# Patient Record
Sex: Male | Born: 1950 | Race: White | Hispanic: No | Marital: Married | Smoking: Never smoker
Health system: Southern US, Community
[De-identification: ages and names within clinical notes are randomized; demographics above are authoritative.]

## PROBLEM LIST (undated history)

## (undated) DIAGNOSIS — K219 Gastro-esophageal reflux disease without esophagitis: Secondary | ICD-10-CM

## (undated) DIAGNOSIS — G5601 Carpal tunnel syndrome, right upper limb: Secondary | ICD-10-CM

## (undated) DIAGNOSIS — G5731 Lesion of lateral popliteal nerve, right lower limb: Secondary | ICD-10-CM

## (undated) DIAGNOSIS — N200 Calculus of kidney: Secondary | ICD-10-CM

## (undated) DIAGNOSIS — Z87442 Personal history of urinary calculi: Secondary | ICD-10-CM

## (undated) DIAGNOSIS — I350 Nonrheumatic aortic (valve) stenosis: Secondary | ICD-10-CM

## (undated) DIAGNOSIS — Z86010 Personal history of colonic polyps: Secondary | ICD-10-CM

## (undated) DIAGNOSIS — R011 Cardiac murmur, unspecified: Secondary | ICD-10-CM

## (undated) DIAGNOSIS — Z8709 Personal history of other diseases of the respiratory system: Secondary | ICD-10-CM

## (undated) DIAGNOSIS — N529 Male erectile dysfunction, unspecified: Secondary | ICD-10-CM

## (undated) DIAGNOSIS — Z860101 Personal history of adenomatous and serrated colon polyps: Secondary | ICD-10-CM

## (undated) DIAGNOSIS — M1990 Unspecified osteoarthritis, unspecified site: Secondary | ICD-10-CM

## (undated) DIAGNOSIS — F419 Anxiety disorder, unspecified: Secondary | ICD-10-CM

## (undated) DIAGNOSIS — N400 Enlarged prostate without lower urinary tract symptoms: Secondary | ICD-10-CM

## (undated) DIAGNOSIS — F329 Major depressive disorder, single episode, unspecified: Secondary | ICD-10-CM

## (undated) DIAGNOSIS — K429 Umbilical hernia without obstruction or gangrene: Secondary | ICD-10-CM

## (undated) DIAGNOSIS — R7302 Impaired glucose tolerance (oral): Secondary | ICD-10-CM

## (undated) DIAGNOSIS — Z95818 Presence of other cardiac implants and grafts: Secondary | ICD-10-CM

## (undated) DIAGNOSIS — I10 Essential (primary) hypertension: Secondary | ICD-10-CM

## (undated) DIAGNOSIS — I4891 Unspecified atrial fibrillation: Secondary | ICD-10-CM

## (undated) DIAGNOSIS — E669 Obesity, unspecified: Secondary | ICD-10-CM

## (undated) DIAGNOSIS — I77810 Thoracic aortic ectasia: Secondary | ICD-10-CM

## (undated) DIAGNOSIS — J40 Bronchitis, not specified as acute or chronic: Secondary | ICD-10-CM

## (undated) DIAGNOSIS — I480 Paroxysmal atrial fibrillation: Secondary | ICD-10-CM

## (undated) DIAGNOSIS — J45909 Unspecified asthma, uncomplicated: Secondary | ICD-10-CM

## (undated) DIAGNOSIS — G4733 Obstructive sleep apnea (adult) (pediatric): Secondary | ICD-10-CM

## (undated) DIAGNOSIS — E119 Type 2 diabetes mellitus without complications: Secondary | ICD-10-CM

## (undated) DIAGNOSIS — C61 Malignant neoplasm of prostate: Secondary | ICD-10-CM

## (undated) DIAGNOSIS — E118 Type 2 diabetes mellitus with unspecified complications: Secondary | ICD-10-CM

## (undated) DIAGNOSIS — W44F1XA Bezoar entering into or through a natural orifice, initial encounter: Secondary | ICD-10-CM

## (undated) DIAGNOSIS — D131 Benign neoplasm of stomach: Secondary | ICD-10-CM

## (undated) DIAGNOSIS — I2510 Atherosclerotic heart disease of native coronary artery without angina pectoris: Secondary | ICD-10-CM

## (undated) DIAGNOSIS — E785 Hyperlipidemia, unspecified: Secondary | ICD-10-CM

## (undated) DIAGNOSIS — Z8719 Personal history of other diseases of the digestive system: Secondary | ICD-10-CM

## (undated) DIAGNOSIS — I4819 Other persistent atrial fibrillation: Secondary | ICD-10-CM

## (undated) DIAGNOSIS — E1159 Type 2 diabetes mellitus with other circulatory complications: Secondary | ICD-10-CM

## (undated) DIAGNOSIS — K2210 Ulcer of esophagus without bleeding: Secondary | ICD-10-CM

## (undated) DIAGNOSIS — E1169 Type 2 diabetes mellitus with other specified complication: Secondary | ICD-10-CM

## (undated) DIAGNOSIS — R911 Solitary pulmonary nodule: Secondary | ICD-10-CM

## (undated) DIAGNOSIS — R0600 Dyspnea, unspecified: Secondary | ICD-10-CM

## (undated) DIAGNOSIS — K317 Polyp of stomach and duodenum: Secondary | ICD-10-CM

## (undated) DIAGNOSIS — I499 Cardiac arrhythmia, unspecified: Secondary | ICD-10-CM

## (undated) DIAGNOSIS — T189XXA Foreign body of alimentary tract, part unspecified, initial encounter: Secondary | ICD-10-CM

## (undated) DIAGNOSIS — F32A Depression, unspecified: Secondary | ICD-10-CM

## (undated) DIAGNOSIS — E1142 Type 2 diabetes mellitus with diabetic polyneuropathy: Principal | ICD-10-CM

## (undated) DIAGNOSIS — K449 Diaphragmatic hernia without obstruction or gangrene: Secondary | ICD-10-CM

## (undated) DIAGNOSIS — I152 Hypertension secondary to endocrine disorders: Secondary | ICD-10-CM

## (undated) DIAGNOSIS — I82409 Acute embolism and thrombosis of unspecified deep veins of unspecified lower extremity: Secondary | ICD-10-CM

## (undated) HISTORY — DX: Ulcer of esophagus without bleeding: K22.10

## (undated) HISTORY — PX: TONSILLECTOMY: SUR1361

## (undated) HISTORY — DX: Other persistent atrial fibrillation: I48.19

## (undated) HISTORY — DX: Solitary pulmonary nodule: R91.1

## (undated) HISTORY — DX: Benign prostatic hyperplasia without lower urinary tract symptoms: N40.0

## (undated) HISTORY — DX: Type 2 diabetes mellitus with diabetic polyneuropathy: E11.42

## (undated) HISTORY — DX: Bezoar entering into or through a natural orifice, initial encounter: W44.F1XA

## (undated) HISTORY — DX: Thoracic aortic ectasia: I77.810

## (undated) HISTORY — PX: EYE SURGERY: SHX253

## (undated) HISTORY — DX: Type 2 diabetes mellitus with other circulatory complications: E11.59

## (undated) HISTORY — DX: Type 2 diabetes mellitus with unspecified complications: E11.8

## (undated) HISTORY — DX: Lesion of lateral popliteal nerve, right lower limb: G57.31

## (undated) HISTORY — DX: Essential (primary) hypertension: I10

## (undated) HISTORY — PX: ESOPHAGOGASTRODUODENOSCOPY: SHX1529

## (undated) HISTORY — DX: Impaired glucose tolerance (oral): R73.02

## (undated) HISTORY — PX: FRACTURE SURGERY: SHX138

## (undated) HISTORY — PX: JOINT REPLACEMENT: SHX530

## (undated) HISTORY — DX: Nonrheumatic aortic (valve) stenosis: I35.0

## (undated) HISTORY — DX: Diaphragmatic hernia without obstruction or gangrene: K44.9

## (undated) HISTORY — DX: Carpal tunnel syndrome, right upper limb: G56.01

## (undated) HISTORY — DX: Atherosclerotic heart disease of native coronary artery without angina pectoris: I25.10

## (undated) HISTORY — DX: Hyperlipidemia, unspecified: E78.5

## (undated) HISTORY — PX: CARDIAC CATHETERIZATION: SHX172

## (undated) HISTORY — DX: Benign neoplasm of stomach: D13.1

## (undated) HISTORY — PX: CYSTOSCOPY W/ STONE MANIPULATION: SHX1427

## (undated) HISTORY — DX: Gastro-esophageal reflux disease without esophagitis: K21.9

## (undated) HISTORY — PX: PERCUTANEOUS NEPHROSTOLITHOTOMY: SHX2207

## (undated) HISTORY — DX: Type 2 diabetes mellitus with other specified complication: E11.69

## (undated) HISTORY — PX: KIDNEY STONE SURGERY: SHX686

## (undated) HISTORY — PX: FOREARM FRACTURE SURGERY: SHX649

## (undated) HISTORY — DX: Personal history of adenomatous and serrated colon polyps: Z86.0101

## (undated) HISTORY — DX: Unspecified osteoarthritis, unspecified site: M19.90

## (undated) HISTORY — DX: Foreign body of alimentary tract, part unspecified, initial encounter: T18.9XXA

## (undated) HISTORY — DX: Personal history of colonic polyps: Z86.010

## (undated) HISTORY — PX: COLONOSCOPY: SHX174

## (undated) HISTORY — DX: Polyp of stomach and duodenum: K31.7

## (undated) HISTORY — DX: Obesity, unspecified: E66.9

## (undated) HISTORY — DX: Unspecified asthma, uncomplicated: J45.909

## (undated) HISTORY — PX: TOTAL KNEE ARTHROPLASTY: SHX125

## (undated) HISTORY — DX: Hypertension secondary to endocrine disorders: I15.2

## (undated) HISTORY — PX: REPLACEMENT TOTAL KNEE BILATERAL: SUR1225

## (undated) HISTORY — PX: KNEE ARTHROSCOPY: SHX127

## (undated) HISTORY — DX: Unspecified atrial fibrillation: I48.91

---

## 1999-11-15 DIAGNOSIS — Z86718 Personal history of other venous thrombosis and embolism: Secondary | ICD-10-CM

## 1999-11-15 HISTORY — PX: TOTAL KNEE ARTHROPLASTY: SHX125

## 1999-11-15 HISTORY — DX: Personal history of other venous thrombosis and embolism: Z86.718

## 1999-11-16 ENCOUNTER — Encounter: Payer: Self-pay | Admitting: Orthopedic Surgery

## 1999-11-22 ENCOUNTER — Inpatient Hospital Stay (HOSPITAL_COMMUNITY): Admission: RE | Admit: 1999-11-22 | Discharge: 1999-11-27 | Payer: Self-pay | Admitting: Orthopedic Surgery

## 1999-11-22 ENCOUNTER — Encounter: Payer: Self-pay | Admitting: Orthopedic Surgery

## 2000-06-05 ENCOUNTER — Encounter: Payer: Self-pay | Admitting: Urology

## 2000-06-05 ENCOUNTER — Ambulatory Visit (HOSPITAL_COMMUNITY): Admission: RE | Admit: 2000-06-05 | Discharge: 2000-06-05 | Payer: Self-pay | Admitting: Urology

## 2005-07-28 ENCOUNTER — Ambulatory Visit (HOSPITAL_BASED_OUTPATIENT_CLINIC_OR_DEPARTMENT_OTHER): Admission: RE | Admit: 2005-07-28 | Discharge: 2005-07-28 | Payer: Self-pay | Admitting: Orthopedic Surgery

## 2005-07-28 ENCOUNTER — Ambulatory Visit (HOSPITAL_COMMUNITY): Admission: RE | Admit: 2005-07-28 | Discharge: 2005-07-28 | Payer: Self-pay | Admitting: Orthopedic Surgery

## 2006-01-30 ENCOUNTER — Encounter: Admission: RE | Admit: 2006-01-30 | Discharge: 2006-01-30 | Payer: Self-pay | Admitting: Family Medicine

## 2006-05-31 ENCOUNTER — Ambulatory Visit: Payer: Self-pay | Admitting: Family Medicine

## 2006-06-03 ENCOUNTER — Encounter: Admission: RE | Admit: 2006-06-03 | Discharge: 2006-06-03 | Payer: Self-pay | Admitting: Family Medicine

## 2007-05-29 ENCOUNTER — Ambulatory Visit: Payer: Self-pay | Admitting: Family Medicine

## 2007-11-28 ENCOUNTER — Ambulatory Visit: Payer: Self-pay | Admitting: Family Medicine

## 2007-12-06 ENCOUNTER — Ambulatory Visit: Payer: Self-pay | Admitting: Family Medicine

## 2007-12-11 ENCOUNTER — Ambulatory Visit: Payer: Self-pay | Admitting: Family Medicine

## 2007-12-31 ENCOUNTER — Encounter: Admission: RE | Admit: 2007-12-31 | Discharge: 2007-12-31 | Payer: Self-pay | Admitting: Family Medicine

## 2008-01-07 ENCOUNTER — Ambulatory Visit: Payer: Self-pay | Admitting: Family Medicine

## 2008-01-22 ENCOUNTER — Ambulatory Visit: Payer: Self-pay | Admitting: Family Medicine

## 2008-02-11 ENCOUNTER — Ambulatory Visit: Payer: Self-pay | Admitting: Family Medicine

## 2008-03-05 ENCOUNTER — Ambulatory Visit: Payer: Self-pay | Admitting: Family Medicine

## 2008-06-04 ENCOUNTER — Ambulatory Visit: Payer: Self-pay | Admitting: Family Medicine

## 2008-11-14 HISTORY — PX: COLONOSCOPY: SHX174

## 2009-01-04 ENCOUNTER — Encounter: Admission: RE | Admit: 2009-01-04 | Discharge: 2009-01-04 | Payer: Self-pay | Admitting: Orthopedic Surgery

## 2009-01-12 ENCOUNTER — Encounter: Admission: RE | Admit: 2009-01-12 | Discharge: 2009-01-12 | Payer: Self-pay | Admitting: Orthopedic Surgery

## 2009-01-27 ENCOUNTER — Encounter: Admission: RE | Admit: 2009-01-27 | Discharge: 2009-01-27 | Payer: Self-pay | Admitting: Orthopedic Surgery

## 2009-02-11 ENCOUNTER — Encounter: Admission: RE | Admit: 2009-02-11 | Discharge: 2009-02-11 | Payer: Self-pay | Admitting: Orthopedic Surgery

## 2009-05-15 ENCOUNTER — Ambulatory Visit: Payer: Self-pay | Admitting: Family Medicine

## 2009-05-26 ENCOUNTER — Ambulatory Visit: Payer: Self-pay | Admitting: Family Medicine

## 2009-06-08 LAB — HM COLONOSCOPY: HM Colonoscopy: 2

## 2010-04-08 ENCOUNTER — Emergency Department (HOSPITAL_COMMUNITY): Admission: EM | Admit: 2010-04-08 | Discharge: 2010-04-08 | Payer: Self-pay | Admitting: Emergency Medicine

## 2010-04-11 ENCOUNTER — Emergency Department (HOSPITAL_COMMUNITY): Admission: EM | Admit: 2010-04-11 | Discharge: 2010-04-11 | Payer: Self-pay | Admitting: Emergency Medicine

## 2010-04-15 ENCOUNTER — Emergency Department (HOSPITAL_COMMUNITY): Admission: EM | Admit: 2010-04-15 | Discharge: 2010-04-15 | Payer: Self-pay | Admitting: Family Medicine

## 2010-04-22 ENCOUNTER — Emergency Department (HOSPITAL_COMMUNITY): Admission: EM | Admit: 2010-04-22 | Discharge: 2010-04-22 | Payer: Self-pay | Admitting: Emergency Medicine

## 2010-06-25 ENCOUNTER — Ambulatory Visit: Payer: Self-pay | Admitting: Family Medicine

## 2010-09-15 ENCOUNTER — Ambulatory Visit: Payer: Self-pay | Admitting: Family Medicine

## 2011-02-25 ENCOUNTER — Other Ambulatory Visit: Payer: Self-pay | Admitting: Orthopedic Surgery

## 2011-02-25 DIAGNOSIS — M542 Cervicalgia: Secondary | ICD-10-CM

## 2011-03-01 ENCOUNTER — Ambulatory Visit
Admission: RE | Admit: 2011-03-01 | Discharge: 2011-03-01 | Disposition: A | Discharge status: Home / Self Care | Payer: BC Managed Care – PPO | Source: Ambulatory Visit | Attending: Orthopedic Surgery | Admitting: Orthopedic Surgery

## 2011-03-01 DIAGNOSIS — M542 Cervicalgia: Secondary | ICD-10-CM

## 2011-03-19 ENCOUNTER — Encounter: Payer: Self-pay | Admitting: Family Medicine

## 2011-03-21 ENCOUNTER — Ambulatory Visit (INDEPENDENT_AMBULATORY_CARE_PROVIDER_SITE_OTHER): Payer: BC Managed Care – PPO | Admitting: Family Medicine

## 2011-03-21 DIAGNOSIS — J45902 Unspecified asthma with status asthmaticus: Secondary | ICD-10-CM

## 2011-03-21 DIAGNOSIS — Z79899 Other long term (current) drug therapy: Secondary | ICD-10-CM

## 2011-03-21 DIAGNOSIS — G478 Other sleep disorders: Secondary | ICD-10-CM

## 2011-03-24 ENCOUNTER — Telehealth: Payer: Self-pay | Admitting: Family Medicine

## 2011-03-24 NOTE — Telephone Encounter (Signed)
Called pt.

## 2011-03-28 ENCOUNTER — Telehealth: Payer: Self-pay | Admitting: Family Medicine

## 2011-03-28 MED ORDER — AZITHROMYCIN 500 MG PO TABS: 500.0000 mg | ORAL_TABLET | Freq: Every day | ORAL | Status: AC

## 2011-03-28 NOTE — Telephone Encounter (Signed)
He states he is approximately 70% better. I will give him another dose pack of azithromycin

## 2011-04-01 NOTE — Op Note (Signed)
[place]  Patient:    Jeremy Tucker                         MRN: [id number] Proc. Date: 11/22/99 Adm. Date:  [id number] Attending:  Colbert Ewing                           Operative Report  PROCEDURE:  Lumbar epidural catheter placement for postoperative pain control.  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 under general anesthesia.  Prior to the procedure, Burna Forts, M.D. explained the risks and benefits of epidural  pain control with the patient and her understood and agreed to proceed with this form of pain control following surgery.  DESCRIPTION OF PROCEDURE:  Upon completion of the procedure while the patient was still under general endotracheal anesthesia, he was turned to the left lateral decubitus position and the low back was prepped and draped sterilely.  The L3-4  interspace was entered in the midline using an 18 gauge Tuohy needle.  Two passes of the needle were required to enter the epidural space using a loss of resistance technique with air.  Aspiration of the needle was negative for blood or CSF. 10 cc of 1/8% Marcaine to which was added 100 mcg of fentanyl was injected through the needle without difficulty.  The catheter was then threaded 4 cm into the epidural space and the needle was removed without difficulty.  The catheter was then taped securely in place.  The patient was subsequently returned to the supine position, extubated, and brought to the recovery room in stable condition.  He will then begun on an epidural fentanyl and Marcaine infusion and followed on a daily basis by the anesthesia service.  He tolerated the procedure well without apparent complications. DD:  11/22/99 TD:  11/22/99 Job: [id number]

## 2011-04-01 NOTE — Discharge Summary (Signed)
[place]  Patient:    Jeremy Tucker                         MRN: [id number] Adm. Date:  [id number] Disc. Date: [id number] Attending:  Colbert Ewing Dictator:   Oris Drone. Petrarca, P.A.-C.                           Discharge Summary  ADMISSION DIAGNOSIS:  Advanced degenerative joint disease of the left knee.  DISCHARGE DIAGNOSIS:  Advanced degenerative joint disease of the left knee.  PROCEDURES:  Left total knee replacement.  HISTORY OF PRESENT ILLNESS:  [name] with persistent left knee pain.  Had an arthroscopy of the left knee is August 1998, stating and showing grade 3 changes medial femoral condyle with chondrocalcinosis.  He has had increasing symptoms with decreasing medial joint space on x-ray.  He is admitted at this time for unicompartmental knee versus total knee replacement.  HOSPITAL COURSE:  [name] admitted November 22, 1999, after appropriate laboratory studies were obtained, as well as 1 g of Ancef IV on call to the operating room.  He was taken to the operating room where he underwent a left total knee replacement.  At the time, he was noted to have rather significant degenerative changes, and it was felt that unicompartmental knee replacement was not indicated.  He tolerated the procedure well.  Continued on Ancef postoperatively.  Epidural was placed for pain management.  Routine medications  were continued.  PT, OT, and social service consults were obtained.  A Foley was placed intraoperatively.  Total knee protocol and precautions were used.  He was weightbearing as tolerated with physical therapy.  He did have difficulty with ome soreness in the throat post intubation, and Cepastat throat lozenges were used.  Postoperatively, he developed elevation of his temperature and, at that time, blood cultures x 2 were ordered and a CBC in the morning.  Kefzol 1 g q.6h.  was then started.  This was on November 23, 1999.  Epidural was discontinued on November 24, 1999.  His Foley also was discontinued on November 24, 1999.  He did develop some hypokalemia, and this was treated with the use of 10 mEq of KCl 2 p.o. b.i.d. and also the use of bananas.  Kefzol was discontinued on November 24, 1999.  Other than his hypokalemia, he had an unremarkable hospital course.  He did well in physical therapy, and was discharged on November 27, 1999, to return back to the office in 10 days for staple removal.  LABORATORY DATA:  EKG was noted to be normal sinus rhythm.  Radiographic studies showed a total knee replacement in good position and alignment on November 22, 1999.  Preoperative hemoglobin 14.3, hematocrit 39.2%, white count 9600, platelets 251,000.  Discharge hemoglobin 11.8, hematocrit 31.8%, white count 11,000, platelets 224,000.  Preoperative chemistries:  Sodium 139, potassium 3.8, chloride 98, CO2 34, glucose 95, BUN 14, creatinine 0.8, calcium 9.9, total protein 7.0,  albumin 4.1, AST was 57, ALT 79, ALP 62, total bilirubin 0.9.  Discharge sodium  137, potassium 3.3, chloride 95, CO2 32, glucose 107, BUN 16, creatinine 0.8, calcium 8.8.  Urinalysis was benign for a voided urine.  Blood cultures x 2 showed no growth.  He was blood type A positive, antibody screen negative.  DISCHARGE MEDICATIONS: 1. He was given a prescription  for Coumadin 7.5 mg as directed by [facility]. 2. Ambien 10 mg 1 q.h.s. p.r.n. sleep. 3. Percocet 1-2 q.4h. p.r.n. pain. 4. Protonix 1 tablet daily.  DISCHARGE INSTRUCTIONS:  He will begin rehabilitation at [facility].  For laboratories, he will need draws on January 16, January 23, January 30, and December 20, 1999.  Use his walker for ambulation.  Keep the dressing clean and dry.  May shower.  Call for increase in temperature.  FOLLOW-UP:  Otherwise, follow back up in the office in 10 days for  staple removal.  CONDITION ON DISCHARGE:  Improved. DD:  01/24/00 TD:  01/24/00 Job: [id number]

## 2011-04-01 NOTE — Op Note (Signed)
NAMEJARIUS, DIEUDONNE NO.:  [id number]   MEDICAL RECORD NO.:  [id number]          PATIENT TYPE:  AMB   LOCATION:  DSC                          FACILITY:  [facility]   PHYSICIAN:  Loreta Ave, M.D. DATE OF BIRTH:  Jun 25, 1951   DATE OF PROCEDURE:  07/28/2005  DATE OF DISCHARGE:                                 OPERATIVE REPORT   PREOPERATIVE DIAGNOSES:  1.  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  2.  Torn medial meniscus.   POSTOPERATIVE DIAGNOSES:  1.  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  2.  Torn medial meniscus.  3.  Lateral meniscus tear.  4.  Grade 2 chondromalacia of patella.  5.  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   PROCEDURES:  1.  Right knee exam under anesthesia.  2.  Arthroscopy with partial medial and lateral meniscectomy.  3.  Partial synovectomy.  4.  Chondroplasty of patella and medial femoral condyle.   SURGEON:  Loreta Ave, M.D.   ASSISTANT:  Genene Churn. Denton Meek.   ANESTHESIA:  General.   BLOOD LOSS:  Minimal.   TOURNIQUET:  Not employed.   SPECIMENS:  None.   CULTURES:  None.   COMPLICATIONS:  None.   DRESSING:  Soft compressive.   PROCEDURE:  The patient was brought to the operating room and after adequate  anesthesia had been obtained, the knee examined.  Good motion and stability.  Good patellofemoral tracking but with crepitus.  Positive McMurray's.  Tourniquet and leg holder applied, the leg prepped and draped in the usual  sterile fashion.  Three portals created, one superolateral, one each medial  and lateral parapatellar.  Inflow catheter introduced, the knee distended,  the arthroscope introduced, and the knee inspected. Grade 2 chondromalacia  of the lateral facet debrided.  A little bit of roughening.  Good tracking.  Marked reactive synovitis throughout the knee debrided.  Medial meniscus  marked complex tearing, entire posterior half, removed down  to a stable rim,  tapered into the remaining meniscus salvaging the anterior half.  Grade 2  and 3 changes throughout the weightbearing dome.  All of this debrided to a  stable surface.  No full-thickness loss.  Plateau looked good.  Cruciate  ligaments intact.  Lateral compartment:  No degenerative changes, but radial  degenerative tearing of the middle third. Saucerized out and tapered it  smoothly.  Remaining meniscus intact.  All recesses examined to be sure all  loose fragments were removed.  Instruments and fluid removed.  Portals and  the knee injected with Marcaine.  Portals closed with 4-0 nylon  Sterile  compressive dressing applied.  Anesthesia reversed.  Brought to the recovery  room.  Tolerated his surgery well with no complications.      Loreta Ave, M.D.  Electronically Signed     DFM/MEDQ  D:  07/28/2005  T:  07/28/2005  Job:  [id number]

## 2011-04-01 NOTE — Op Note (Signed)
[place]  Patient:    Jeremy Tucker                         MRN: [id number] Proc. Date: 11/22/99 Adm. Date:  [id number] Attending:  Colbert Ewing CC:         Oris Drone. Petrarca, P.A.-C.                           Operative Report  PREOPERATIVE DIAGNOSIS:  Degenerative joint disease, end stage left knee.  POSTOPERATIVE DIAGNOSIS:  Degenerative joint disease, end stage left knee with tricompartmental changes.  PROCEDURE:  Left knee exam under anesthesia followed by left total knee replacement.  Osteonics prosthesis.  Press-Fit posterior stabilizing #9 femoral  component.  Cemented #9 tibial component with 10 mm polyethylene insert. Cemented recessed nonmetal backed 26 mm patellar component.  SURGEON:  Loreta Ave, M.D.  ASSISTANT:  Arlys John D. Petrarca, P.A.-C.  ANESTHESIA:  General.  BLOOD LOSS:  Minimal.  TOURNIQUET TIME:  1 hour.  SPECIMENS:  Excised bone and soft tissue.  CULTURES: None.  COMPLICATIONS:  None.  DRAINS:  Hemovac x 2.  PROCEDURE:  The patient was brought to the operating room, placed on the operative table in supine position.  After adequate anesthesia had been obtained the left  knee examined.  Just about full extension at full flexion.  Good stability but ith varus alignment and with tibial femoral medial as well as patellofemoral crepitus. Tourniquet applied.  Prepped and draped in the usual sterile fashion. Exsanguinated with elevation and Esmarch.  Tourniquet inflated to 350 mmHg. STraight incision above the patella down to the tibial tubercle.  Medial parapatellar arthrotomy.  Knee exposed.  Grade 4 changes medial compartment with Grade 2-3 changes patellofemoral joint and lateral compartment therefore necessitating need for complete rather than unicompartmental replacement.  Further the posterior cruciate ligament is deficient with a positive posterior drawer, positive ski slope and  posterolateral and posteromedial instability.  The remnants of menisci and ACL removed as well as hypertrophic synovitis.  A very prominent lateral plica incised to ensure good tracking patellofemoral joint at  completion.  Distal femur exposed.  Intermedullary guide placed.  Distal cut removing 10 mm set about ______ valgus.  Sized from a 9 component.  Jigs put in  place.  Definitive cuts made for the posterior stabilizing prosthesis.  Trial put in place and found to fit well.  Trial removed.  Tibia exposed.  Tibial spines removed with a saw.  The intermedullary guide placed.  Proximal cut removing 6 m off the deficient medial side with a 5 degree posterior slope cut.  Trials put n place, marked for rotation with excellent stability and alignment with the 10 mm insert including full flexion without component lift-off.  Trials removed. Hand reaming proximal tibial after appropriately marked rotation.  Patella was then exposed, sized, reamed and drilled with a 26 mm patella.  With all trials in place I had full extension, full flexion at 5 degrees of valgus alignment.  Good stability and good patellofemoral tracking.  All trials removed.  All recess examined.  All spurs and loose bodies removed.  Copious irrigation with a pulse irrigating device.  Cement prepared and placed n the tibial component which is hammered in place.  Polyethylene attached. Femoral component then hammered in place with the knee reduced.   Patellar component held in place with a clamp after  cement removed.  Once the cement had hardened the knee was examined.  Full extension, full flexion, good stability, good alignment. Two Hemovacs were placed and brought out through separate stab wounds.   Arthrotomy  closed with #1 Vicryl, skin and subcutaneous tissue with Vicryl and staples. Marked the wound and knee injected with Marcaine.  Sterile compressive dressing  applied.  A knee immobilizer applied after  tourniquet removed.  Anesthesia was hen to proceed with placement of an epidural catheter for postoperative analgesia. Following this anesthesia reversed and brought to recovery room.  Tolerated surgery well with no complications. DD:  11/22/99 TD:  11/22/99 Job: [id number]

## 2011-04-04 ENCOUNTER — Telehealth: Payer: Self-pay | Admitting: Family Medicine

## 2011-04-21 NOTE — Telephone Encounter (Signed)
DONE

## 2011-06-08 ENCOUNTER — Other Ambulatory Visit: Payer: Self-pay | Admitting: Family Medicine

## 2011-06-08 NOTE — Telephone Encounter (Signed)
Is this ok?

## 2011-06-09 ENCOUNTER — Other Ambulatory Visit: Payer: Self-pay

## 2011-06-09 NOTE — Telephone Encounter (Signed)
This has already been done.

## 2011-10-15 ENCOUNTER — Other Ambulatory Visit: Payer: Self-pay | Admitting: Family Medicine

## 2011-10-17 ENCOUNTER — Other Ambulatory Visit: Payer: Self-pay | Admitting: Family Medicine

## 2011-10-17 MED ORDER — LISINOPRIL-HYDROCHLOROTHIAZIDE 10-12.5 MG PO TABS: 1.0000 | ORAL_TABLET | Freq: Every day | ORAL | Status: DC

## 2011-10-17 NOTE — Telephone Encounter (Signed)
Called pt to let him know that med was called in and to make appt after holiday

## 2011-10-17 NOTE — Telephone Encounter (Signed)
Have him set up an appointment to see me after the holidays

## 2011-10-17 NOTE — Telephone Encounter (Signed)
Sent med in for pt  

## 2011-10-17 NOTE — Telephone Encounter (Signed)
Is this ok?

## 2011-11-17 ENCOUNTER — Telehealth: Payer: Self-pay | Admitting: Internal Medicine

## 2011-11-17 MED ORDER — LISINOPRIL-HYDROCHLOROTHIAZIDE 10-12.5 MG PO TABS: 1.0000 | ORAL_TABLET | Freq: Every day | ORAL | Status: DC

## 2011-11-17 NOTE — Telephone Encounter (Signed)
Refilled for 90 day. Will call pt and schedule an appt to come in

## 2011-12-01 ENCOUNTER — Telehealth: Payer: Self-pay | Admitting: Internal Medicine

## 2011-12-01 MED ORDER — ZOLPIDEM TARTRATE 10 MG PO TABS: 10.0000 mg | ORAL_TABLET | Freq: Every evening | ORAL | Status: DC | PRN

## 2011-12-01 NOTE — Telephone Encounter (Signed)
Renew zolpidem.

## 2011-12-01 NOTE — Telephone Encounter (Signed)
Called med in per jcl 

## 2011-12-01 NOTE — Telephone Encounter (Signed)
Called ambien in per jcl 

## 2011-12-05 ENCOUNTER — Encounter: Payer: Self-pay | Admitting: Family Medicine

## 2011-12-05 ENCOUNTER — Ambulatory Visit (INDEPENDENT_AMBULATORY_CARE_PROVIDER_SITE_OTHER): Payer: BC Managed Care – PPO | Admitting: Family Medicine

## 2011-12-05 ENCOUNTER — Telehealth: Payer: Self-pay | Admitting: Internal Medicine

## 2011-12-05 VITALS — BP 126/80 | HR 59 | Ht 72.0 in | Wt 246.0 lb

## 2011-12-05 DIAGNOSIS — E669 Obesity, unspecified: Secondary | ICD-10-CM

## 2011-12-05 DIAGNOSIS — E1159 Type 2 diabetes mellitus with other circulatory complications: Secondary | ICD-10-CM

## 2011-12-05 DIAGNOSIS — E1169 Type 2 diabetes mellitus with other specified complication: Secondary | ICD-10-CM

## 2011-12-05 DIAGNOSIS — E118 Type 2 diabetes mellitus with unspecified complications: Secondary | ICD-10-CM | POA: Insufficient documentation

## 2011-12-05 DIAGNOSIS — N529 Male erectile dysfunction, unspecified: Secondary | ICD-10-CM

## 2011-12-05 DIAGNOSIS — E785 Hyperlipidemia, unspecified: Secondary | ICD-10-CM

## 2011-12-05 DIAGNOSIS — I152 Hypertension secondary to endocrine disorders: Secondary | ICD-10-CM

## 2011-12-05 DIAGNOSIS — M129 Arthropathy, unspecified: Secondary | ICD-10-CM

## 2011-12-05 DIAGNOSIS — J45909 Unspecified asthma, uncomplicated: Secondary | ICD-10-CM | POA: Insufficient documentation

## 2011-12-05 DIAGNOSIS — I10 Essential (primary) hypertension: Secondary | ICD-10-CM

## 2011-12-05 DIAGNOSIS — M1990 Unspecified osteoarthritis, unspecified site: Secondary | ICD-10-CM

## 2011-12-05 DIAGNOSIS — E119 Type 2 diabetes mellitus without complications: Secondary | ICD-10-CM

## 2011-12-05 LAB — LIPID PANEL
Cholesterol: 119 mg/dL (ref 0–200)
HDL: 43 mg/dL (ref >39)
LDL Cholesterol: 53 mg/dL (ref 0–99)
Total CHOL/HDL Ratio: 2.8 Ratio
Triglycerides: 113 mg/dL (ref <150)
VLDL: 23 mg/dL (ref 0–40)

## 2011-12-05 LAB — POCT GLYCOSYLATED HEMOGLOBIN (HGB A1C): Hemoglobin A1C: 6.3

## 2011-12-05 MED ORDER — MOMETASONE FUROATE 220 MCG/INH IN AEPB: 2.0000 | INHALATION_SPRAY | Freq: Two times a day (BID) | RESPIRATORY_TRACT | Status: DC

## 2011-12-05 MED ORDER — LISINOPRIL-HYDROCHLOROTHIAZIDE 10-12.5 MG PO TABS: 1.0000 | ORAL_TABLET | Freq: Every day | ORAL | Status: DC

## 2011-12-05 MED ORDER — EZETIMIBE-SIMVASTATIN 10-80 MG PO TABS: 1.0000 | ORAL_TABLET | Freq: Every day | ORAL | Status: DC

## 2011-12-05 MED ORDER — BUPROPION HCL ER (XL) 150 MG PO TB24: 150.0000 mg | ORAL_TABLET | ORAL | Status: DC

## 2011-12-05 MED ORDER — ALBUTEROL SULFATE HFA 108 (90 BASE) MCG/ACT IN AERS: 2.0000 | INHALATION_SPRAY | Freq: Four times a day (QID) | RESPIRATORY_TRACT | Status: DC | PRN

## 2011-12-05 NOTE — Progress Notes (Signed)
  Subjective:    Patient ID: Jeremy Tucker, male    DOB: 22-Sep-1951, 61 y.o.   MRN: [id number]  HPI He is here for medication check. He continues on medications listed in the chart. He admits to not exercising regularly. He has not checked his blood sugars recently. He continues on his allergy and asthma medications. He has had some difficulty recently with erectile dysfunction and was given Cialis by his urologist. He has a history of renal stones and is on allopurinol to help with this. This seems to be working. He continues on Celebrex which seems to be the only medicine that helps his arthritis. His marriage is going well. He is getting ready to go on a cruise.   Review of Systems     Objective:   Physical Exam Alert and in no distress. Hbl A1c is 6.3       Assessment & Plan:   1. Hyperlipidemia LDL goal <70  Lipid panel  2. Diabetes mellitus  POCT HgB A1C  3. Obesity (BMI 30-39.9)    4. Asthma    5. Arthritis    6. ED (erectile dysfunction)    7. Hypertension associated with diabetes    8 renal stones Encouraged him to make further changes in his diet and exercise regimen. Lipid panel will be drawn.

## 2011-12-06 ENCOUNTER — Other Ambulatory Visit: Payer: Self-pay

## 2011-12-09 ENCOUNTER — Telehealth: Payer: Self-pay

## 2011-12-12 NOTE — Telephone Encounter (Signed)
done

## 2011-12-12 NOTE — Telephone Encounter (Signed)
Opened by accident

## 2011-12-20 ENCOUNTER — Encounter: Payer: Self-pay | Admitting: Medical

## 2011-12-20 ENCOUNTER — Ambulatory Visit (INDEPENDENT_AMBULATORY_CARE_PROVIDER_SITE_OTHER): Payer: BC Managed Care – PPO | Admitting: Medical

## 2011-12-20 VITALS — BP 130/80 | HR 62 | Temp 98.1°F | Resp 16

## 2011-12-20 DIAGNOSIS — J40 Bronchitis, not specified as acute or chronic: Secondary | ICD-10-CM

## 2011-12-20 MED ORDER — AZITHROMYCIN 500 MG PO TABS: 500.0000 mg | ORAL_TABLET | Freq: Every day | ORAL | Status: AC

## 2011-12-20 NOTE — Progress Notes (Signed)
Subjective:  Jeremy Tucker is a 61 y.o. male who presents for 1 day hx/o lightheaded, dizzy, body aches, cough, wheezing last night.  Denies fever.  He sees Dr. Annalee Genta and uses nasal spray at night.  He notes mild sinus pressure, but no ear pain or sore throat. +some nausea this morning.  He notes hx/o bronchitis, usually 1-2 times yearly.   He has never smoked.  He has been on Asmanex for a few years.   Denies sick contacts.  No other aggravating or relieving factors.  No other c/o.  The following portions of the patient's history were reviewed and updated as appropriate: allergies, current medications, past family history, past medical history, past social history, past surgical history and problem list.  Past Medical History  Diagnosis Date  . Hyperlipidemia   . Allergy   . GERD (gastroesophageal reflux disease)   . Diabetes mellitus   . BPH (benign prostatic hyperplasia)   . Gout   . Obesity      Objective:   Filed Vitals:   12/20/11 0926  BP: 130/80  Pulse: 62  Temp: 98.1 F (36.7 C)  Resp: 16    General appearance: Alert, WD/WN, no distress                             Skin: warm, no rash, no diaphoresis                           Head: no sinus tenderness                            Eyes: conjunctiva normal, corneas clear, PERRLA                            Ears: left TM retracted right TM normal, external ear canals normal                          Nose: septum midline, right turbinate swollen, with erythema              Mouth/throat: MMM, tongue normal, mild pharyngeal erythema                           Neck: supple, no adenopathy, no thyromegaly, nontender                          Heart: RRR, normal S1, S2, no murmurs                         Lungs: +bronchial breath sounds, +scattered rhonchi, left upper faint wheeze, no rales                Extremities: no edema, nontender     Assessment and Plan:   Encounter Diagnosis  Name Primary?  . Bronchitis Yes   Advised  that symptom currently may just suggest early viral bronchitis.   Advised rest, hydration, OTC Mucinex DM, c/t daily Asmanex, increase albuterol inhaler to TID for next several days.  If worsening, can begin Azithromycin.  If worse, wheezing, SOB, then call/return.  I reviewed prior notes in chart showing similar prior eval and treatment for bronchitis and asthmatic bronchitis.

## 2011-12-20 NOTE — Patient Instructions (Signed)
For the next few days consider Mucinex DM or Coricidin HBP for cough and congestion.  Continue Asmanex daily.  Use the inhaler/albuterol 3 times daily the next few days.    Rest, drink plenty of water.  I wrote a prescription for Azithromycin.  If your symptoms worsen such as fever, thick mucous production with cough, then begin Azithromycin.  Call or return if worse or not improving.    Acute Bronchitis Bronchitis is a problem of the air tubes leading to your lungs. Acute means the illness started quickly. In this condition, the lining of those tubes becomes puffy (swollen) and can leak fluid. This makes it harder for air to get in and out of your lungs. You may cough a lot. This is because the air tubes are narrow. Bronchitis is most often caused by a virus. Medicines that kill germs (antibiotics) may be needed with germ (bacteria) infections for people who:  Smoke.   Have lasting (chronic) lung problems.   Are elderly.  HOME CARE  Rest.   Drink enough water and fluids to keep the pee clear or pale yellow.   Only take medicine as told by your doctor.   Medicines may be prescribed that will open up the airways. This will help make breathing easier.   Bronchitis usually gets better on its own in a few days.  Recovery from some problems (symptoms) of bronchitis may be slow. You should start feeling a little better after 2 to 3 days. Coughing may last for 3 to 4 weeks. GET HELP RIGHT AWAY IF:  You or your child has a temperature by mouth above 101, not controlled by medicine.   Chills or chest pain develops.   You or your child develops very bad shortness of breath.   There is bloody saliva mixed with mucus (sputum).   You or your child throws up (vomits) often, loses too much fluid (dehydration), feels faint, or has a very bad headache.   You or your child does not improve after 1 week of treatment.  MAKE SURE YOU:   Understand these instructions.   Will watch this condition.     Will get help right away if you or your child is not doing well or gets worse.  Document Released: 04/18/2008 Document Re-Released: 01/25/2010 [facility] Patient Information 2011 [place], [place].

## 2011-12-23 ENCOUNTER — Telehealth: Payer: Self-pay | Admitting: Family Medicine

## 2011-12-23 NOTE — Telephone Encounter (Signed)
Pt called and would like for you go call him and tell him what more to do.  Fever, sweats, staying in bed, coughing up yellow chunks.  Do you want to call him in something more to [facility] on [address] and he really wants to talk with you.

## 2011-12-23 NOTE — Telephone Encounter (Signed)
Dr. Susann Givens called pt about his concerns.

## 2012-01-05 ENCOUNTER — Other Ambulatory Visit: Payer: Self-pay | Admitting: Family Medicine

## 2012-01-05 MED ORDER — AZITHROMYCIN 500 MG PO TABS: 500.0000 mg | ORAL_TABLET | Freq: Every day | ORAL | Status: AC

## 2012-01-05 MED ORDER — HYDROCOD POLST-CHLORPHEN POLST 10-8 MG/5ML PO LQCR: 5.0000 mL | Freq: Two times a day (BID) | ORAL | Status: DC | PRN

## 2012-03-07 ENCOUNTER — Telehealth: Payer: Self-pay | Admitting: Family Medicine

## 2012-03-07 NOTE — Telephone Encounter (Signed)
Called pt and advised need 1 month ov to follow up blood sugar.  He will call back after checking his calendar.

## 2012-04-13 ENCOUNTER — Encounter: Payer: Self-pay | Admitting: Internal Medicine

## 2012-04-23 ENCOUNTER — Encounter: Payer: Self-pay | Admitting: Family Medicine

## 2012-04-23 ENCOUNTER — Ambulatory Visit (INDEPENDENT_AMBULATORY_CARE_PROVIDER_SITE_OTHER): Payer: BC Managed Care – PPO | Admitting: Family Medicine

## 2012-04-23 ENCOUNTER — Telehealth: Payer: Self-pay | Admitting: Family Medicine

## 2012-04-23 ENCOUNTER — Other Ambulatory Visit: Payer: Self-pay | Admitting: Family Medicine

## 2012-04-23 VITALS — BP 130/80 | HR 80 | Ht 72.0 in | Wt 248.0 lb

## 2012-04-23 DIAGNOSIS — J45909 Unspecified asthma, uncomplicated: Secondary | ICD-10-CM

## 2012-04-23 DIAGNOSIS — E669 Obesity, unspecified: Secondary | ICD-10-CM

## 2012-04-23 DIAGNOSIS — N529 Male erectile dysfunction, unspecified: Secondary | ICD-10-CM

## 2012-04-23 DIAGNOSIS — I152 Hypertension secondary to endocrine disorders: Secondary | ICD-10-CM

## 2012-04-23 DIAGNOSIS — I10 Essential (primary) hypertension: Secondary | ICD-10-CM

## 2012-04-23 DIAGNOSIS — E785 Hyperlipidemia, unspecified: Secondary | ICD-10-CM

## 2012-04-23 DIAGNOSIS — Z0000 Encounter for general adult medical examination without abnormal findings: Secondary | ICD-10-CM

## 2012-04-23 DIAGNOSIS — E119 Type 2 diabetes mellitus without complications: Secondary | ICD-10-CM

## 2012-04-23 DIAGNOSIS — E1159 Type 2 diabetes mellitus with other circulatory complications: Secondary | ICD-10-CM

## 2012-04-23 DIAGNOSIS — Z2911 Encounter for prophylactic immunotherapy for respiratory syncytial virus (RSV): Secondary | ICD-10-CM

## 2012-04-23 DIAGNOSIS — E1169 Type 2 diabetes mellitus with other specified complication: Secondary | ICD-10-CM

## 2012-04-23 LAB — CBC WITH DIFFERENTIAL/PLATELET
Basophils Absolute: 0 10*3/uL (ref 0.0–0.1)
Basophils Relative: 1 % (ref 0–1)
Eosinophils Absolute: 0.1 10*3/uL (ref 0.0–0.7)
Eosinophils Relative: 2 % (ref 0–5)
HCT: 43.8 % (ref 39.0–52.0)
Hemoglobin: 15.1 g/dL (ref 13.0–17.0)
Lymphocytes Relative: 18 % (ref 12–46)
Lymphs Abs: 1.3 10*3/uL (ref 0.7–4.0)
MCH: 30.3 pg (ref 26.0–34.0)
MCHC: 34.5 g/dL (ref 30.0–36.0)
MCV: 88 fL (ref 78.0–100.0)
Monocytes Absolute: 0.7 10*3/uL (ref 0.1–1.0)
Monocytes Relative: 9 % (ref 3–12)
Neutro Abs: 5.2 10*3/uL (ref 1.7–7.7)
Neutrophils Relative %: 70 % (ref 43–77)
Platelets: 247 10*3/uL (ref 150–400)
RBC: 4.98 MIL/uL (ref 4.22–5.81)
RDW: 13.4 % (ref 11.5–15.5)
WBC: 7.3 10*3/uL (ref 4.0–10.5)

## 2012-04-23 LAB — COMPREHENSIVE METABOLIC PANEL
ALT: 51 U/L (ref 0–53)
AST: 37 U/L (ref 0–37)
Albumin: 4.8 g/dL (ref 3.5–5.2)
Alkaline Phosphatase: 61 U/L (ref 39–117)
BUN: 11 mg/dL (ref 6–23)
CO2: 26 mEq/L (ref 19–32)
Calcium: 9.7 mg/dL (ref 8.4–10.5)
Chloride: 101 mEq/L (ref 96–112)
Creat: 0.72 mg/dL (ref 0.50–1.35)
Glucose, Bld: 131 mg/dL — ABNORMAL HIGH (ref 70–99)
Potassium: 4.1 mEq/L (ref 3.5–5.3)
Sodium: 138 mEq/L (ref 135–145)
Total Bilirubin: 0.5 mg/dL (ref 0.3–1.2)
Total Protein: 7.1 g/dL (ref 6.0–8.3)

## 2012-04-23 LAB — LIPID PANEL
Cholesterol: 137 mg/dL (ref 0–200)
HDL: 50 mg/dL (ref >39)
LDL Cholesterol: 65 mg/dL (ref 0–99)
Total CHOL/HDL Ratio: 2.7 Ratio
Triglycerides: 111 mg/dL (ref <150)
VLDL: 22 mg/dL (ref 0–40)

## 2012-04-23 LAB — PSA: PSA: 2.27 ng/mL (ref <=4.00)

## 2012-04-23 MED ORDER — ZOLPIDEM TARTRATE 10 MG PO TABS: 10.0000 mg | ORAL_TABLET | Freq: Every evening | ORAL | Status: DC | PRN

## 2012-04-23 NOTE — Progress Notes (Signed)
Called ambien in per jcl 

## 2012-04-23 NOTE — Progress Notes (Signed)
Subjective:    Patient ID: Jeremy Tucker, male    DOB: [birth year], 61 y.o.   MRN: [id number]  HPI  he is here for complete examination. He does have underlying allergies and asthma and keeps these well controlled. Presently he is on no medication for his diabetes . He admits to dietary indiscretion and has not been as compliant with his exercises he should. He has had some recent difficulty with erectile dysfunction and blames this on the use of Rapaflo. He saw his urologist and was given Cialis on a daily basis for this. He is scheduled for colonoscopy. He does check his blood sugars intermittently. He has an eye exam as well as cardiology exam set up in the near future. His work is quite stressful due to financial concerns. Work stress is causing intermittent difficulty with sleep however he uses very low doses of Ambien only usually twice per week. He and his wife are getting along quite well.   Review of Systems Negative except as above    Objective:   Physical Exam BP 130/80  Pulse 80  Ht 6' (1.829 m)  Wt 248 lb (112.492 kg)  BMI 33.63 kg/m2  General Appearance:    Alert, cooperative, no distress, appears stated age  Head:    Normocephalic, without obvious abnormality, atraumatic  Eyes:    PERRL, conjunctiva/corneas clear, EOM's intact, fundi    benign  Ears:    Normal TM's and external ear canals  Nose:   Nares normal, mucosa normal, no drainage or sinus   tenderness  Throat:   Lips, mucosa, and tongue normal; teeth and gums normal  Neck:   Supple, no lymphadenopathy;  thyroid:  no   enlargement/tenderness/nodules; no carotid   bruit or JVD  Back:    Spine nontender, no curvature, ROM normal, no CVA     tenderness  Lungs:     Clear to auscultation bilaterally without wheezes, rales or     ronchi; respirations unlabored  Chest Wall:    No tenderness or deformity   Heart:    Regular rate and rhythm, S1 and S2 normal, no murmur, rub   or gallop  Breast Exam:    No chest wall  tenderness, masses or gynecomastia  Abdomen:     Soft, non-tender, nondistended, normoactive bowel sounds,    no masses, no hepatosplenomegaly  Genitalia:   deferred   Rectal:   deferred   Extremities:   No clubbing, cyanosis or edema  Pulses:   2+ and symmetric all extremities  Skin:   Skin color, texture, turgor normal, no rashes or lesions  Lymph nodes:   Cervical, supraclavicular, and axillary nodes normal  Neurologic:   CNII-XII intact, normal strength, sensation and gait; reflexes 2+ and symmetric throughout          Psych:   Normal mood, affect, hygiene and grooming.           Assessment & Plan:   1. Routine general medical examination at a health care facility  Varicella-zoster vaccine subcutaneous, PSA, CBC with Differential, Comprehensive metabolic panel, Lipid panel  2. Hyperlipidemia LDL goal <70  Lipid panel  3. Diabetes mellitus  POCT UA - Microalbumin  4. Obesity (BMI 30-39.9)    5. Asthma    6. ED (erectile dysfunction)  PSA  7. Hypertension associated with diabetes  CBC with Differential, Comprehensive metabolic panel   he'll continue on his present medications. I discussed the use of Cialis on a daily basis. He is  doing well on the 5 mg but did recommend he try 2.5 mg in potentially uses on an as-needed basis. I will give him a refill on his Ambien. Encouraged him to make diet and exercise changes however do this on a permanent basis rather than on an intermittent basis.

## 2012-04-23 NOTE — Patient Instructions (Signed)
Keep working on [employer] and exercise. Start to cut back on carbohydrates.

## 2012-04-24 ENCOUNTER — Other Ambulatory Visit: Payer: Self-pay

## 2012-04-24 LAB — HEMOGLOBIN A1C
Hgb A1c MFr Bld: 6.4 % — ABNORMAL HIGH (ref <5.7)
Mean Plasma Glucose: 137 mg/dL — ABNORMAL HIGH (ref <117)

## 2012-04-24 MED ORDER — ZOLPIDEM TARTRATE 10 MG PO TABS: 10.0000 mg | ORAL_TABLET | Freq: Every evening | ORAL | Status: DC | PRN

## 2012-04-24 NOTE — Telephone Encounter (Signed)
Given 30. I thought it was actually 30

## 2012-04-24 NOTE — Telephone Encounter (Signed)
Pt called and wanted to know why his Remus Loffler was only # 15 and not #30

## 2012-04-24 NOTE — Telephone Encounter (Signed)
CALLED IN AMBIEN AGAIN IT NEEDED TO BE # 30 NOT # 15

## 2012-04-26 NOTE — Telephone Encounter (Signed)
DONE

## 2012-05-10 ENCOUNTER — Encounter: Payer: Self-pay | Admitting: Family Medicine

## 2012-05-14 LAB — HM COLONOSCOPY: HM Colonoscopy: NORMAL

## 2012-09-27 ENCOUNTER — Telehealth: Payer: Self-pay | Admitting: Family Medicine

## 2012-09-27 MED ORDER — ZOLPIDEM TARTRATE 10 MG PO TABS: 10.0000 mg | ORAL_TABLET | Freq: Every evening | ORAL | Status: DC | PRN

## 2012-09-27 NOTE — Telephone Encounter (Signed)
Okay to renew

## 2012-09-27 NOTE — Telephone Encounter (Signed)
Called ambien into pharmacy.  

## 2012-12-26 ENCOUNTER — Other Ambulatory Visit: Payer: Self-pay | Admitting: Family Medicine

## 2013-01-09 ENCOUNTER — Other Ambulatory Visit: Payer: Self-pay | Admitting: Family Medicine

## 2013-01-09 NOTE — Telephone Encounter (Signed)
Is this ok?

## 2013-01-09 NOTE — Telephone Encounter (Signed)
Renew the Ambien 

## 2013-01-09 NOTE — Telephone Encounter (Signed)
CALLED AMBIEN IN PER JCL 

## 2013-01-10 ENCOUNTER — Telehealth: Payer: Self-pay | Admitting: Internal Medicine

## 2013-01-10 NOTE — Telephone Encounter (Signed)
error 

## 2013-01-11 ENCOUNTER — Telehealth: Payer: Self-pay | Admitting: Family Medicine

## 2013-01-14 NOTE — Telephone Encounter (Signed)
Called and left message to call back.

## 2013-01-14 NOTE — Telephone Encounter (Signed)
Vytorin is costing him a lot of money. I recommended that he call back when he is about to run out to possibly be switched to Crestor and be given a saviors card

## 2013-01-28 ENCOUNTER — Telehealth: Payer: Self-pay | Admitting: Family Medicine

## 2013-01-28 NOTE — Telephone Encounter (Signed)
Pt called and stated he wanted you to call him. Pt stated that he is feeling a little light-headed but states his bp is fine 120/75. Just wanted to discuss issues pt stated he didn't think a appt was necessary plus he didn't know if it should be with you Sharlene Motts. Please call pt.

## 2013-01-28 NOTE — Telephone Encounter (Signed)
He called stating that he was having some difficulty with shortness of breath with walking up a flight of stairs. He also complains of slight dizziness. I recommended that he call his cardiologist for evaluation of his shortness of his DOE. And then we will deal with the dizziness at a later date.

## 2013-01-29 ENCOUNTER — Ambulatory Visit (INDEPENDENT_AMBULATORY_CARE_PROVIDER_SITE_OTHER): Payer: BC Managed Care – PPO | Admitting: Family Medicine

## 2013-01-29 ENCOUNTER — Encounter: Payer: Self-pay | Admitting: Family Medicine

## 2013-01-29 VITALS — BP 124/80 | HR 74 | Wt 252.0 lb

## 2013-01-29 DIAGNOSIS — I951 Orthostatic hypotension: Secondary | ICD-10-CM

## 2013-01-29 DIAGNOSIS — H8110 Benign paroxysmal vertigo, unspecified ear: Secondary | ICD-10-CM

## 2013-01-29 MED ORDER — MECLIZINE HCL 12.5 MG PO TABS: 12.5000 mg | ORAL_TABLET | Freq: Three times a day (TID) | ORAL | Status: DC | PRN

## 2013-01-29 NOTE — Patient Instructions (Signed)
See if there is a correlation between her symptoms and taking the Cialis

## 2013-01-29 NOTE — Progress Notes (Signed)
  Subjective:    Patient ID: Jeremy Tucker, male    DOB: 1951/11/09, 62 y.o.   MRN: [id number]  HPI He is here for evaluation of right-sided headache discomfort as well as dizziness. The 2 are not related. They're intermittent in nature and have been going on for last 4 weeks. He does note that when he lies down into its his head back, he will have more difficulty with dizziness but this does not have an effect on his pain. He also notes dizziness when he goes from sitting to standing very quickly however this dizziness goes away very fast. The right-sided pain is intermittent in nature but no associated blurred vision, double vision, nausea or vomiting. He is under stress from work related issues as well as his only son getting married and 32 days. He also is had some dyspnea on exertion symptoms and is scheduled for a stress test tomorrow morning.   Review of Systems     Objective:   Physical Exam alert and in no distress. EOMI. DTRs normal.Tympanic membranes and canals are normal. Throat is clear. Tonsils are normal. Neck is supple without adenopathy or thyromegaly. Cardiac exam shows a regular sinus rhythm without murmurs or gallops. Lungs are clear to auscultation.        Assessment & Plan:  Postural hypotension  Benign paroxysmal positional vertigo - Plan: meclizine (ANTIVERT) 12.5 MG tablet I explained that some of his symptoms especially getting up very quickly are posture related. Also lying down sounds more like positional vertigo. Recommended an anti-inflammatory for his headache. Explained that none these symptoms make me worry about anything devastating. He will keep in touch with me if any of the symptoms change.

## 2013-03-07 ENCOUNTER — Other Ambulatory Visit: Payer: Self-pay | Admitting: Family Medicine

## 2013-03-08 NOTE — Telephone Encounter (Signed)
Is this okay to fill? 

## 2013-03-08 NOTE — Telephone Encounter (Signed)
Ok to renew?  

## 2013-03-08 NOTE — Telephone Encounter (Signed)
Done

## 2013-03-08 NOTE — Telephone Encounter (Signed)
Okay to renew

## 2013-04-01 ENCOUNTER — Other Ambulatory Visit: Payer: Self-pay | Admitting: Family Medicine

## 2013-04-02 NOTE — Telephone Encounter (Signed)
Is this ok?

## 2013-04-02 NOTE — Telephone Encounter (Signed)
Okay to renew

## 2013-04-02 NOTE — Telephone Encounter (Signed)
CALLED THIS IN 

## 2013-05-17 ENCOUNTER — Other Ambulatory Visit: Payer: Self-pay | Admitting: Family Medicine

## 2013-05-20 NOTE — Telephone Encounter (Signed)
Is this ok?

## 2013-05-20 NOTE — Telephone Encounter (Signed)
Okay to renew

## 2013-05-30 ENCOUNTER — Encounter: Payer: Self-pay | Admitting: Family Medicine

## 2013-07-03 ENCOUNTER — Other Ambulatory Visit: Payer: Self-pay | Admitting: Family Medicine

## 2013-07-03 ENCOUNTER — Telehealth: Payer: Self-pay | Admitting: Family Medicine

## 2013-07-03 NOTE — Telephone Encounter (Signed)
PT CALLED AND STATED THAT HE NEEDED REFILLS ON MEDICATIONS. HE STATES THAT YOU HAD A CONVERSATION WITH HIM ABOUT CHANGING VYTORIN BECAUSE IT WAS SO EXPENSIVE. PT WAS REMINDED HE IS DUE FOR CPE. PT STATES HE WILL CALL ME BACK IN THE NEXT FEW DAYS TO SET ONE UP FOR October. I DID NOTICE THAT THESE CAME THRU EMAIL TOO. REQUEST WAS TO  REFILL BP MEDS AND VYTORIN. AGAIN HE WANTS VYTORIN CHANGED. I AM SENDING MESSAGE TO YOU AND NURSE SO THEY CAN SEE NOTE SO THEY WILL NOT FILL VYTORIN UNTIL THEY HEAR FROM YOU. PLEASE REFILL BP MEDS AS WELL. PT USES [facility] AT [facility] AND [address].

## 2013-07-03 NOTE — Telephone Encounter (Signed)
He needs to let us know what statin drug will be better covered under his insurance plan. Call it in and renew his other medication

## 2013-07-04 NOTE — Telephone Encounter (Signed)
PT INFORMED AND SAID HE WOULD CALL ME BACK

## 2013-07-08 NOTE — Telephone Encounter (Signed)
STILL WAITING ON HIM TO CALL BACK

## 2013-07-09 ENCOUNTER — Telehealth: Payer: Self-pay | Admitting: Family Medicine

## 2013-07-09 MED ORDER — EZETIMIBE-SIMVASTATIN 10-40 MG PO TABS: 1.0000 | ORAL_TABLET | Freq: Every day | ORAL | Status: DC

## 2013-07-09 NOTE — Telephone Encounter (Signed)
LM

## 2013-07-14 NOTE — Telephone Encounter (Signed)
Change to atorvastatin  20mg  and recheck his lipids in 2 mo.

## 2013-07-16 ENCOUNTER — Telehealth: Payer: Self-pay | Admitting: Family Medicine

## 2013-07-16 ENCOUNTER — Other Ambulatory Visit: Payer: Self-pay

## 2013-07-16 MED ORDER — ATORVASTATIN CALCIUM 20 MG PO TABS: 20.0000 mg | ORAL_TABLET | Freq: Every day | ORAL | Status: DC

## 2013-07-16 NOTE — Telephone Encounter (Signed)
DID YOU SEE THIS

## 2013-07-16 NOTE — Telephone Encounter (Signed)
SENT IN NEW CHOLESTEROL MED

## 2013-07-18 NOTE — Telephone Encounter (Signed)
JCL talked with pt & switched medication

## 2013-07-23 NOTE — Telephone Encounter (Signed)
tsd  °

## 2013-08-03 ENCOUNTER — Other Ambulatory Visit: Payer: Self-pay | Admitting: Family Medicine

## 2013-09-19 ENCOUNTER — Other Ambulatory Visit: Payer: Self-pay

## 2013-09-20 ENCOUNTER — Other Ambulatory Visit: Payer: Self-pay | Admitting: Family Medicine

## 2013-09-20 NOTE — Telephone Encounter (Signed)
Is this okay to fill these meds

## 2013-09-21 NOTE — Telephone Encounter (Signed)
OK to renew Ambien

## 2013-11-03 ENCOUNTER — Other Ambulatory Visit: Payer: Self-pay | Admitting: Family Medicine

## 2013-11-04 ENCOUNTER — Telehealth: Payer: Self-pay | Admitting: Family Medicine

## 2013-11-04 NOTE — Telephone Encounter (Signed)
I don't know how he got that message but I'll see him next March

## 2013-11-04 NOTE — Telephone Encounter (Signed)
Is this okay?

## 2013-11-04 NOTE — Telephone Encounter (Signed)
Pt called and stated that he was informed he needed an appt. I dont see anything is his chart. Does he need to be seen and if so for what?

## 2013-11-04 NOTE — Telephone Encounter (Signed)
PT SAID OK

## 2013-12-04 ENCOUNTER — Telehealth: Payer: Self-pay | Admitting: Internal Medicine

## 2013-12-04 NOTE — Telephone Encounter (Signed)
Pt would like to try a ventolin inhaler for 1 month to replace his asmanex. Send to [facility]

## 2013-12-06 ENCOUNTER — Telehealth: Payer: Self-pay | Admitting: Internal Medicine

## 2013-12-06 NOTE — Telephone Encounter (Signed)
     Pt would like to try a ventolin inhaler for 1 month to replace his asmanex. Send to [facility]

## 2013-12-27 ENCOUNTER — Other Ambulatory Visit: Payer: Self-pay | Admitting: Family Medicine

## 2013-12-30 NOTE — Telephone Encounter (Signed)
Is this ok to refill?  

## 2014-01-26 ENCOUNTER — Other Ambulatory Visit: Payer: Self-pay | Admitting: Family Medicine

## 2014-01-28 ENCOUNTER — Other Ambulatory Visit: Payer: Self-pay | Admitting: Family Medicine

## 2014-02-27 ENCOUNTER — Other Ambulatory Visit: Payer: Self-pay | Admitting: Family Medicine

## 2014-02-27 MED ORDER — ATORVASTATIN CALCIUM 20 MG PO TABS: 20.0000 mg | ORAL_TABLET | Freq: Every day | ORAL | Status: DC

## 2014-02-27 NOTE — Telephone Encounter (Signed)
Is this ok to refill?  

## 2014-02-27 NOTE — Telephone Encounter (Signed)
Okay to renew

## 2014-02-27 NOTE — Telephone Encounter (Signed)
Medication called in 

## 2014-05-05 ENCOUNTER — Other Ambulatory Visit: Payer: Self-pay | Admitting: Family Medicine

## 2014-05-22 ENCOUNTER — Other Ambulatory Visit: Payer: Self-pay | Admitting: Family Medicine

## 2014-05-22 NOTE — Telephone Encounter (Signed)
IS THIS OKAY 

## 2014-05-22 NOTE — Telephone Encounter (Signed)
Okay to renew

## 2014-08-04 ENCOUNTER — Other Ambulatory Visit: Payer: Self-pay | Admitting: Family Medicine

## 2014-08-04 NOTE — Telephone Encounter (Signed)
DR.LALONDE IS THIS OKAY 

## 2014-08-04 NOTE — Telephone Encounter (Signed)
Don't let them run out and let him know it is time for a recheck. Only give him 30

## 2014-08-05 ENCOUNTER — Telehealth: Payer: Self-pay | Admitting: Internal Medicine

## 2014-08-05 ENCOUNTER — Other Ambulatory Visit: Payer: Self-pay | Admitting: Family Medicine

## 2014-08-05 MED ORDER — BUPROPION HCL ER (XL) 150 MG PO TB24: 150.0000 mg | ORAL_TABLET | Freq: Every day | ORAL | Status: DC

## 2014-08-05 NOTE — Telephone Encounter (Signed)
I have called pt and let him know that he hasn't been seen in a while for a CPE and Dr. Redmond School is only giving him a 30 day supply of his bupropion but he gets a 90 day supply at a time and per JCL it is okay to do a 90 day. Pt ask for me to call him on 9/29 to schedule a cpe for him.

## 2014-09-02 ENCOUNTER — Other Ambulatory Visit: Payer: Self-pay | Admitting: Family Medicine

## 2014-09-23 ENCOUNTER — Other Ambulatory Visit: Payer: Self-pay | Admitting: Family Medicine

## 2014-09-23 ENCOUNTER — Encounter: Payer: Self-pay | Admitting: Family Medicine

## 2014-09-23 ENCOUNTER — Telehealth: Payer: Self-pay | Admitting: Internal Medicine

## 2014-09-23 ENCOUNTER — Telehealth: Payer: Self-pay | Admitting: Family Medicine

## 2014-09-23 ENCOUNTER — Ambulatory Visit (INDEPENDENT_AMBULATORY_CARE_PROVIDER_SITE_OTHER): Payer: BC Managed Care – PPO | Admitting: Family Medicine

## 2014-09-23 VITALS — BP 124/76 | HR 70 | Ht 72.0 in | Wt 234.0 lb

## 2014-09-23 DIAGNOSIS — Z0000 Encounter for general adult medical examination without abnormal findings: Secondary | ICD-10-CM

## 2014-09-23 DIAGNOSIS — E669 Obesity, unspecified: Secondary | ICD-10-CM

## 2014-09-23 DIAGNOSIS — J4520 Mild intermittent asthma, uncomplicated: Secondary | ICD-10-CM

## 2014-09-23 DIAGNOSIS — E785 Hyperlipidemia, unspecified: Secondary | ICD-10-CM

## 2014-09-23 DIAGNOSIS — H269 Unspecified cataract: Secondary | ICD-10-CM

## 2014-09-23 DIAGNOSIS — Z87442 Personal history of urinary calculi: Secondary | ICD-10-CM

## 2014-09-23 DIAGNOSIS — R011 Cardiac murmur, unspecified: Secondary | ICD-10-CM

## 2014-09-23 DIAGNOSIS — R030 Elevated blood-pressure reading, without diagnosis of hypertension: Secondary | ICD-10-CM

## 2014-09-23 DIAGNOSIS — N529 Male erectile dysfunction, unspecified: Secondary | ICD-10-CM

## 2014-09-23 DIAGNOSIS — R7302 Impaired glucose tolerance (oral): Secondary | ICD-10-CM

## 2014-09-23 DIAGNOSIS — IMO0001 Reserved for inherently not codable concepts without codable children: Secondary | ICD-10-CM

## 2014-09-23 LAB — CBC WITH DIFFERENTIAL/PLATELET
Basophils Absolute: 0 10*3/uL (ref 0.0–0.1)
Basophils Relative: 0 % (ref 0–1)
Eosinophils Absolute: 0.3 10*3/uL (ref 0.0–0.7)
Eosinophils Relative: 3 % (ref 0–5)
HCT: 43 % (ref 39.0–52.0)
Hemoglobin: 14.8 g/dL (ref 13.0–17.0)
Lymphocytes Relative: 15 % (ref 12–46)
Lymphs Abs: 1.4 10*3/uL (ref 0.7–4.0)
MCH: 31.4 pg (ref 26.0–34.0)
MCHC: 34.4 g/dL (ref 30.0–36.0)
MCV: 91.3 fL (ref 78.0–100.0)
Monocytes Absolute: 0.7 10*3/uL (ref 0.1–1.0)
Monocytes Relative: 8 % (ref 3–12)
Neutro Abs: 6.9 10*3/uL (ref 1.7–7.7)
Neutrophils Relative %: 74 % (ref 43–77)
Platelets: 257 10*3/uL (ref 150–400)
RBC: 4.71 MIL/uL (ref 4.22–5.81)
RDW: 13.3 % (ref 11.5–15.5)
WBC: 9.3 10*3/uL (ref 4.0–10.5)

## 2014-09-23 LAB — COMPREHENSIVE METABOLIC PANEL
ALT: 33 U/L (ref 0–53)
AST: 29 U/L (ref 0–37)
Albumin: 4.4 g/dL (ref 3.5–5.2)
Alkaline Phosphatase: 76 U/L (ref 39–117)
BUN: 14 mg/dL (ref 6–23)
CO2: 25 mEq/L (ref 19–32)
Calcium: 9 mg/dL (ref 8.4–10.5)
Chloride: 101 mEq/L (ref 96–112)
Creat: 0.71 mg/dL (ref 0.50–1.35)
Glucose, Bld: 122 mg/dL — ABNORMAL HIGH (ref 70–99)
Potassium: 4.1 mEq/L (ref 3.5–5.3)
Sodium: 138 mEq/L (ref 135–145)
Total Bilirubin: 0.4 mg/dL (ref 0.2–1.2)
Total Protein: 6.5 g/dL (ref 6.0–8.3)

## 2014-09-23 LAB — LIPID PANEL
Cholesterol: 153 mg/dL (ref 0–200)
HDL: 43 mg/dL (ref >39)
LDL Cholesterol: 63 mg/dL (ref 0–99)
Total CHOL/HDL Ratio: 3.6 Ratio
Triglycerides: 234 mg/dL — ABNORMAL HIGH (ref <150)
VLDL: 47 mg/dL — ABNORMAL HIGH (ref 0–40)

## 2014-09-23 LAB — POCT GLYCOSYLATED HEMOGLOBIN (HGB A1C): Hemoglobin A1C: 6

## 2014-09-23 MED ORDER — BUPROPION HCL ER (XL) 300 MG PO TB24: 300.0000 mg | ORAL_TABLET | Freq: Every day | ORAL | Status: DC

## 2014-09-23 MED ORDER — ZOLPIDEM TARTRATE 10 MG PO TABS: ORAL_TABLET | ORAL | Status: DC

## 2014-09-23 MED ORDER — ATORVASTATIN CALCIUM 20 MG PO TABS: 20.0000 mg | ORAL_TABLET | Freq: Every day | ORAL | Status: DC

## 2014-09-23 NOTE — Progress Notes (Signed)
Subjective:    Patient ID: Jeremy Tucker, male    DOB: August 08, 1951, 63 y.o.   MRN: [id number]  HPI He is here for complete examination. He remains under work-related stress. 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 He does use a small amount of Ambien on a daily basis stating one third of the pill. Presently he is on Wellbutrin 150 mg per day. He has stopped taking his asthma medications stating he has had no difficulty with freezing for at least the last 3 or 4 months. He has also stopped his blood pressure medication. He continues on Rapaflo for treatment of BPH. He also has erectile dysfunction and does use ED medicine given by his urologist. He has a previous history of glucose intolerance with a hemoglobin A1c of 6.4. He has lost some weight since then. He does plan on getting a colonoscopy next year.he has a history of cataracts and is being followed by ophthalmology. He has no other concerns or complaints. His marriage is going well. Social and family history was otherwise reviewed.   Review of Systems  All other systems reviewed and are negative.      Objective:   Physical Exam BP 124/76 mmHg  Pulse 70  Ht 6' (1.829 m)  Wt 234 lb (106.142 kg)  BMI 31.73 kg/m2  General Appearance:    Alert, cooperative, no distress, appears stated age  Head:    Normocephalic, without obvious abnormality, atraumatic  Eyes:    PERRL, conjunctiva/corneas clear, EOM's intact, fundi    Difficult to see due to cataracts  Ears:    Normal TM's and external ear canals  Nose:   Nares normal, mucosa normal, no drainage or sinus   tenderness  Throat:   Lips, mucosa, and tongue normal; teeth and gums normal  Neck:   Supple, no lymphadenopathy;  thyroid:  no   enlargement/tenderness/nodules; no carotid   bruit or JVD  Back:    Spine nontender, no curvature, ROM normal, no CVA     tenderness  Lungs:     Clear to  auscultation bilaterally without wheezes, rales or     ronchi; respirations unlabored  Chest Wall:    No tenderness or deformity   Heart:    Irregular rhythm is heard. A harsh 8-0/9 systolic murmur heard best along the left sternal border was noted. No gallops.  Breast Exam:    No chest wall tenderness, masses or gynecomastia  Abdomen:     Soft, non-tender, nondistended, normoactive bowel sounds,    no masses, no hepatosplenomegaly        Extremities:   No clubbing, cyanosis or edema  Pulses:   2+ and symmetric all extremities  Skin:   Skin color, texture, turgor normal, no rashes or lesions  Lymph nodes:   Cervical, supraclavicular, and axillary nodes normal  Neurologic:   CNII-XII intact, normal strength, sensation and gait; reflexes 2+ and symmetric throughout          Psych:   Normal mood, affect, hygiene and grooming.    EKG shows PACs HbA1c is 6.0     Assessment & Plan:  Routine general medical examination at a health care facility - Plan: CBC with Differential, Comprehensive metabolic panel, Lipid panel, POCT glycosylated hemoglobin (Hb A1C)  Murmur, cardiac - Plan: EKG 12-Lead, 2D Echocardiogram without contrast  Asthma, mild intermittent, uncomplicated  Glucose intolerance (impaired glucose tolerance)  Hyperlipidemia  LDL goal <70 - Plan: Lipid panel, atorvastatin (LIPITOR) 20 MG tablet  Obesity (BMI 30-39.9)  Cataract  History of renal stone  Elevated blood pressure  Erectile dysfunction, unspecified erectile dysfunction type since he is doing well off his asthma medicine as well as hypertension meds, I will not renew them at this time.I will have him increase his Wellbutrin to 300 mg. He will let me know whether the 150 versus a 300 mg is a difference in cost. He will continue to be followed by urology. Follow-up on the murmur pending results of echocardiogram.

## 2014-09-23 NOTE — Telephone Encounter (Signed)
Called in med 

## 2014-09-23 NOTE — Telephone Encounter (Signed)
Pt is aware of his EchoCardiogram appt on 09/25/14 @ 8:30am at [facility] on [address]. Pt was approved from today to 10/22/14 confirmation # [id number]

## 2014-09-23 NOTE — Telephone Encounter (Signed)
Wellbutrin 300 called in as well as a 90 supply of Ambien.

## 2014-09-23 NOTE — Telephone Encounter (Signed)
Is this okay to call in? 

## 2014-09-25 ENCOUNTER — Ambulatory Visit (HOSPITAL_COMMUNITY): Payer: BC Managed Care – PPO | Attending: Cardiology

## 2014-09-25 DIAGNOSIS — R011 Cardiac murmur, unspecified: Secondary | ICD-10-CM | POA: Diagnosis not present

## 2014-09-25 DIAGNOSIS — E785 Hyperlipidemia, unspecified: Secondary | ICD-10-CM | POA: Diagnosis not present

## 2014-09-25 DIAGNOSIS — E669 Obesity, unspecified: Secondary | ICD-10-CM | POA: Insufficient documentation

## 2014-09-25 DIAGNOSIS — I350 Nonrheumatic aortic (valve) stenosis: Secondary | ICD-10-CM

## 2014-09-25 NOTE — Progress Notes (Signed)
   Subjective:    Patient ID: Jeremy Tucker, male    DOB: 1951/02/19, 63 y.o.   MRN: [id number]  HPI    Review of Systems     Objective:   Physical Exam        Assessment & Plan:  The echo does show aortic stenosis with slight regurg. This was also reviewed by Dr. Daneen Schick. He recommended repeating this in 3 years. The information was given to the patient.

## 2014-09-25 NOTE — Progress Notes (Signed)
2D Echo completed. 09/25/2014

## 2014-10-23 ENCOUNTER — Telehealth: Payer: Self-pay | Admitting: Internal Medicine

## 2014-10-23 NOTE — Telephone Encounter (Signed)
Pt would like you to call him, has he has a question about heart murmur.

## 2014-10-24 ENCOUNTER — Telehealth: Payer: Self-pay | Admitting: Family Medicine

## 2014-10-24 NOTE — Telephone Encounter (Signed)
Pt called and stated he needed to speak to you. He states he talked with you yesterday and this is a follow up from that conversation. Pt can be reached at [phone].

## 2014-10-31 ENCOUNTER — Other Ambulatory Visit: Payer: Self-pay | Admitting: Family Medicine

## 2014-11-04 NOTE — Telephone Encounter (Signed)
11/04/2014 °

## 2014-11-14 HISTORY — PX: CATARACT EXTRACTION W/ INTRAOCULAR LENS  IMPLANT, BILATERAL: SHX1307

## 2014-11-14 HISTORY — PX: EYE SURGERY: SHX253

## 2014-12-03 ENCOUNTER — Telehealth: Payer: Self-pay | Admitting: Internal Medicine

## 2014-12-03 ENCOUNTER — Other Ambulatory Visit: Payer: Self-pay

## 2014-12-03 MED ORDER — ZOLPIDEM TARTRATE 10 MG PO TABS: ORAL_TABLET | ORAL | Status: DC

## 2014-12-03 NOTE — Telephone Encounter (Signed)
Refill request for zolpidem 10mg  # 90 to [facility]

## 2014-12-03 NOTE — Telephone Encounter (Signed)
Ok

## 2014-12-03 NOTE — Telephone Encounter (Signed)
done

## 2015-01-01 ENCOUNTER — Telehealth: Payer: Self-pay | Admitting: Family Medicine

## 2015-01-01 NOTE — Telephone Encounter (Signed)
Pt called and requested a call back from you. He has concerns over the recent news about Prilosec. Please call pt at [phone].

## 2015-01-01 NOTE — Telephone Encounter (Signed)
He called concerning news about Prilosec. I recommended that he use this as minimally as possible. He will back off to every other day and then potentially every third day. Also discussed the possible use of H2 blockers.

## 2015-01-19 ENCOUNTER — Telehealth: Payer: Self-pay | Admitting: Family Medicine

## 2015-01-19 NOTE — Telephone Encounter (Signed)
Pt said he just got back from [place] visiting his grand daughter and is now sick.  He would not accept an appt.  He asked that you call him.  [phone]   Sx = head cold, coughing up yellow

## 2015-01-21 ENCOUNTER — Telehealth: Payer: Self-pay | Admitting: Family Medicine

## 2015-01-21 ENCOUNTER — Encounter (HOSPITAL_COMMUNITY): Payer: Self-pay | Admitting: *Deleted

## 2015-01-21 ENCOUNTER — Other Ambulatory Visit (HOSPITAL_COMMUNITY): Payer: Self-pay

## 2015-01-21 ENCOUNTER — Encounter: Payer: Self-pay | Admitting: Family Medicine

## 2015-01-21 ENCOUNTER — Ambulatory Visit (INDEPENDENT_AMBULATORY_CARE_PROVIDER_SITE_OTHER): Payer: BLUE CROSS/BLUE SHIELD | Admitting: Family Medicine

## 2015-01-21 ENCOUNTER — Observation Stay (HOSPITAL_COMMUNITY)
Admission: EM | Admit: 2015-01-21 | Discharge: 2015-01-22 | Disposition: A | Discharge status: Home / Self Care | Payer: BLUE CROSS/BLUE SHIELD | Attending: Cardiovascular Disease | Admitting: Cardiovascular Disease

## 2015-01-21 ENCOUNTER — Emergency Department (HOSPITAL_COMMUNITY): Payer: BLUE CROSS/BLUE SHIELD

## 2015-01-21 VITALS — BP 122/80 | HR 73 | Temp 98.3°F | Wt 230.0 lb

## 2015-01-21 DIAGNOSIS — E785 Hyperlipidemia, unspecified: Secondary | ICD-10-CM | POA: Diagnosis not present

## 2015-01-21 DIAGNOSIS — I519 Heart disease, unspecified: Secondary | ICD-10-CM | POA: Diagnosis not present

## 2015-01-21 DIAGNOSIS — I4891 Unspecified atrial fibrillation: Secondary | ICD-10-CM | POA: Diagnosis not present

## 2015-01-21 DIAGNOSIS — J069 Acute upper respiratory infection, unspecified: Secondary | ICD-10-CM

## 2015-01-21 DIAGNOSIS — M109 Gout, unspecified: Secondary | ICD-10-CM | POA: Insufficient documentation

## 2015-01-21 DIAGNOSIS — Z7982 Long term (current) use of aspirin: Secondary | ICD-10-CM | POA: Diagnosis not present

## 2015-01-21 DIAGNOSIS — I350 Nonrheumatic aortic (valve) stenosis: Secondary | ICD-10-CM

## 2015-01-21 DIAGNOSIS — K219 Gastro-esophageal reflux disease without esophagitis: Secondary | ICD-10-CM | POA: Insufficient documentation

## 2015-01-21 DIAGNOSIS — R079 Chest pain, unspecified: Secondary | ICD-10-CM | POA: Diagnosis not present

## 2015-01-21 DIAGNOSIS — J45909 Unspecified asthma, uncomplicated: Secondary | ICD-10-CM | POA: Insufficient documentation

## 2015-01-21 DIAGNOSIS — Z6830 Body mass index (BMI) 30.0-30.9, adult: Secondary | ICD-10-CM | POA: Diagnosis not present

## 2015-01-21 DIAGNOSIS — Z886 Allergy status to analgesic agent status: Secondary | ICD-10-CM | POA: Diagnosis not present

## 2015-01-21 DIAGNOSIS — E118 Type 2 diabetes mellitus with unspecified complications: Secondary | ICD-10-CM | POA: Diagnosis present

## 2015-01-21 DIAGNOSIS — I5189 Other ill-defined heart diseases: Secondary | ICD-10-CM | POA: Diagnosis not present

## 2015-01-21 DIAGNOSIS — E119 Type 2 diabetes mellitus without complications: Secondary | ICD-10-CM | POA: Insufficient documentation

## 2015-01-21 DIAGNOSIS — I480 Paroxysmal atrial fibrillation: Secondary | ICD-10-CM | POA: Diagnosis not present

## 2015-01-21 DIAGNOSIS — E669 Obesity, unspecified: Secondary | ICD-10-CM | POA: Diagnosis not present

## 2015-01-21 DIAGNOSIS — E1169 Type 2 diabetes mellitus with other specified complication: Secondary | ICD-10-CM | POA: Diagnosis present

## 2015-01-21 DIAGNOSIS — R5383 Other fatigue: Secondary | ICD-10-CM | POA: Diagnosis present

## 2015-01-21 DIAGNOSIS — N400 Enlarged prostate without lower urinary tract symptoms: Secondary | ICD-10-CM | POA: Insufficient documentation

## 2015-01-21 HISTORY — DX: Depression, unspecified: F32.A

## 2015-01-21 HISTORY — DX: Cardiac murmur, unspecified: R01.1

## 2015-01-21 HISTORY — DX: Unspecified osteoarthritis, unspecified site: M19.90

## 2015-01-21 HISTORY — DX: Major depressive disorder, single episode, unspecified: F32.9

## 2015-01-21 HISTORY — DX: Calculus of kidney: N20.0

## 2015-01-21 HISTORY — DX: Paroxysmal atrial fibrillation: I48.0

## 2015-01-21 HISTORY — DX: Nonrheumatic aortic (valve) stenosis: I35.0

## 2015-01-21 HISTORY — DX: Acute embolism and thrombosis of unspecified deep veins of unspecified lower extremity: I82.409

## 2015-01-21 LAB — BASIC METABOLIC PANEL
Anion gap: 13 (ref 5–15)
BUN: 16 mg/dL (ref 6–23)
CO2: 24 mmol/L (ref 19–32)
Calcium: 9.6 mg/dL (ref 8.4–10.5)
Chloride: 100 mmol/L (ref 96–112)
Creatinine, Ser: 0.81 mg/dL (ref 0.50–1.35)
GFR calc Af Amer: >90 mL/min (ref >90)
GFR calc non Af Amer: >90 mL/min (ref >90)
Glucose, Bld: 106 mg/dL — ABNORMAL HIGH (ref 70–99)
Potassium: 3.6 mmol/L (ref 3.5–5.1)
Sodium: 137 mmol/L (ref 135–145)

## 2015-01-21 LAB — I-STAT TROPONIN, ED
Troponin i, poc: 0.03 ng/mL (ref 0.00–0.08)
Troponin i, poc: 0.02 ng/mL (ref 0.00–0.08)

## 2015-01-21 LAB — CBC
HCT: 47.2 % (ref 39.0–52.0)
Hemoglobin: 16.7 g/dL (ref 13.0–17.0)
MCH: 31.2 pg (ref 26.0–34.0)
MCHC: 35.4 g/dL (ref 30.0–36.0)
MCV: 88.1 fL (ref 78.0–100.0)
Platelets: 232 10*3/uL (ref 150–400)
RBC: 5.36 MIL/uL (ref 4.22–5.81)
RDW: 12.4 % (ref 11.5–15.5)
WBC: 10.1 10*3/uL (ref 4.0–10.5)

## 2015-01-21 LAB — TSH: TSH: 1.338 u[IU]/mL (ref 0.350–4.500)

## 2015-01-21 LAB — MAGNESIUM: Magnesium: 2 mg/dL (ref 1.5–2.5)

## 2015-01-21 LAB — BRAIN NATRIURETIC PEPTIDE: B Natriuretic Peptide: 258 pg/mL — ABNORMAL HIGH (ref 0.0–100.0)

## 2015-01-21 MED ORDER — ZOLPIDEM TARTRATE 5 MG PO TABS
5.0000 mg | ORAL_TABLET | Freq: Every evening | ORAL | Status: DC | PRN
  Administered 2015-01-21: 5 mg via ORAL
  Filled 2015-01-21: qty 1

## 2015-01-21 MED ORDER — MULTI-VITAMIN/MINERALS PO TABS
1.0000 | ORAL_TABLET | Freq: Every day | ORAL | Status: DC
  Administered 2015-01-22: 1 via ORAL
  Filled 2015-01-21: qty 1

## 2015-01-21 MED ORDER — ASPIRIN EC 81 MG PO TBEC
81.0000 mg | DELAYED_RELEASE_TABLET | Freq: Every day | ORAL | Status: DC
  Filled 2015-01-21: qty 1

## 2015-01-21 MED ORDER — OLOPATADINE HCL 0.1 % OP SOLN
1.0000 [drp] | Freq: Every day | OPHTHALMIC | Status: DC | PRN
  Filled 2015-01-21: qty 5

## 2015-01-21 MED ORDER — ASPIRIN 81 MG PO CHEW: 324.0000 mg | CHEWABLE_TABLET | ORAL | Status: DC

## 2015-01-21 MED ORDER — SODIUM CHLORIDE 0.9 % IJ SOLN
3.0000 mL | Freq: Two times a day (BID) | INTRAMUSCULAR | Status: DC
  Administered 2015-01-21 – 2015-01-22 (×2): 3 mL via INTRAVENOUS

## 2015-01-21 MED ORDER — ALBUTEROL SULFATE HFA 108 (90 BASE) MCG/ACT IN AERS: 2.0000 | INHALATION_SPRAY | Freq: Four times a day (QID) | RESPIRATORY_TRACT | Status: DC | PRN

## 2015-01-21 MED ORDER — HEPARIN SODIUM (PORCINE) 5000 UNIT/ML IJ SOLN
5000.0000 [IU] | Freq: Three times a day (TID) | INTRAMUSCULAR | Status: DC
  Administered 2015-01-21 – 2015-01-22 (×2): 5000 [IU] via SUBCUTANEOUS
  Filled 2015-01-21 (×5): qty 1

## 2015-01-21 MED ORDER — NITROGLYCERIN 2 % TD OINT
0.5000 [in_us] | TOPICAL_OINTMENT | Freq: Once | TRANSDERMAL | Status: AC
  Administered 2015-01-21: 0.5 [in_us] via TOPICAL
  Filled 2015-01-21: qty 1

## 2015-01-21 MED ORDER — SODIUM CHLORIDE 0.9 % IV SOLN: 250.0000 mL | INTRAVENOUS | Status: DC | PRN

## 2015-01-21 MED ORDER — ALBUTEROL SULFATE (2.5 MG/3ML) 0.083% IN NEBU: 2.5000 mg | INHALATION_SOLUTION | Freq: Four times a day (QID) | RESPIRATORY_TRACT | Status: DC | PRN

## 2015-01-21 MED ORDER — FLUTICASONE PROPIONATE HFA 44 MCG/ACT IN AERO
2.0000 | INHALATION_SPRAY | Freq: Two times a day (BID) | RESPIRATORY_TRACT | Status: DC
  Filled 2015-01-21: qty 10.6

## 2015-01-21 MED ORDER — BUPROPION HCL ER (XL) 300 MG PO TB24
300.0000 mg | ORAL_TABLET | Freq: Every day | ORAL | Status: DC
  Administered 2015-01-22: 300 mg via ORAL
  Filled 2015-01-21: qty 1

## 2015-01-21 MED ORDER — ALPRAZOLAM 0.25 MG PO TABS: 0.2500 mg | ORAL_TABLET | Freq: Two times a day (BID) | ORAL | Status: DC | PRN

## 2015-01-21 MED ORDER — OMEGA-3 FATTY ACIDS 1000 MG PO CAPS
1.0000 g | ORAL_CAPSULE | Freq: Every day | ORAL | Status: DC
  Administered 2015-01-22: 1 g via ORAL
  Filled 2015-01-21: qty 1

## 2015-01-21 MED ORDER — ASPIRIN 300 MG RE SUPP: 300.0000 mg | RECTAL | Status: DC

## 2015-01-21 MED ORDER — NITROGLYCERIN 0.4 MG SL SUBL: 0.4000 mg | SUBLINGUAL_TABLET | SUBLINGUAL | Status: DC | PRN

## 2015-01-21 MED ORDER — PANTOPRAZOLE SODIUM 40 MG PO TBEC
40.0000 mg | DELAYED_RELEASE_TABLET | Freq: Every day | ORAL | Status: DC
  Administered 2015-01-22: 40 mg via ORAL
  Filled 2015-01-21: qty 1

## 2015-01-21 MED ORDER — SODIUM CHLORIDE 0.9 % IJ SOLN: 3.0000 mL | INTRAMUSCULAR | Status: DC | PRN

## 2015-01-21 MED ORDER — SODIUM CHLORIDE 0.9 % IV BOLUS (SEPSIS)
500.0000 mL | Freq: Once | INTRAVENOUS | Status: AC
  Administered 2015-01-21: 500 mL via INTRAVENOUS

## 2015-01-21 MED ORDER — TAMSULOSIN HCL 0.4 MG PO CAPS
0.4000 mg | ORAL_CAPSULE | Freq: Every day | ORAL | Status: DC
  Administered 2015-01-21: 0.4 mg via ORAL
  Filled 2015-01-21 (×2): qty 1

## 2015-01-21 MED ORDER — ONDANSETRON HCL 4 MG/2ML IJ SOLN: 4.0000 mg | Freq: Four times a day (QID) | INTRAMUSCULAR | Status: DC | PRN

## 2015-01-21 MED ORDER — ALLOPURINOL 300 MG PO TABS
300.0000 mg | ORAL_TABLET | Freq: Every day | ORAL | Status: DC
  Administered 2015-01-22: 300 mg via ORAL
  Filled 2015-01-21: qty 1

## 2015-01-21 MED ORDER — ATORVASTATIN CALCIUM 20 MG PO TABS
20.0000 mg | ORAL_TABLET | Freq: Every day | ORAL | Status: DC
  Administered 2015-01-21: 20 mg via ORAL
  Filled 2015-01-21 (×2): qty 1

## 2015-01-21 MED ORDER — ASPIRIN 81 MG PO CHEW
324.0000 mg | CHEWABLE_TABLET | Freq: Once | ORAL | Status: AC
  Administered 2015-01-21: 324 mg via ORAL
  Filled 2015-01-21: qty 4

## 2015-01-21 MED ORDER — ACETAMINOPHEN 325 MG PO TABS
650.0000 mg | ORAL_TABLET | ORAL | Status: DC | PRN
  Administered 2015-01-21 – 2015-01-22 (×2): 650 mg via ORAL
  Filled 2015-01-21 (×2): qty 2

## 2015-01-21 NOTE — ED Notes (Addendum)
Pt reports recent cold symptoms and not eating for 2 days. This am was taking a shower and felt weak, near syncope. Went to pcp and sent here due to new onset atrial fib. Reports mild sob with exertion. Was told that Pernell Dupre, MD needs to be called on pts arrival to room.

## 2015-01-21 NOTE — Progress Notes (Signed)
   Subjective:    Patient ID: Jeremy Tucker, male    DOB: 12/27/1950, 64 y.o.   MRN: [id number]  HPI  approximately 4 days ago he developed a sore throat and dry cough. A few days later the cough became productive with malaise, anorexia and fatigue. Today he became quite weak and almost passed out. He complains of shortness of breath and is also having a slightly induct of cough.   Review of Systems     Objective:   Physical Exam Alert and in no distress. Tympanic membranes and canals are normal. Pharyngeal area is normal. Neck is supple without adenopathy or thyromegaly. Cardiac exam shows an irregular rapid rhythm without murmurs or gallops. Lungs are clear to auscultation. EKG shows atrial fib/flutter       Assessment & Plan:  Atrial fibrillation, unspecified - Plan: EKG 12-Lead  Acute URI  a call was made to Dr. Daneen Schick however he is in a meeting. Cardiology was called and I discussed the case with Dr. Meda Coffee. He will be sent to [facility]

## 2015-01-21 NOTE — ED Notes (Signed)
Pt in X ray

## 2015-01-21 NOTE — Telephone Encounter (Signed)
Pt called and stated that he just feels terrible. He stated he wanted to talk to you. Pt was offered an appt and he stated that he would come now and pt was informed that you have a very busy schedule and he would probably be waiting a while. Pt then made an appt for this afternoon but stated he would like to speak to you prior to his vist. He asked me to state it was urgent. Pt can be reached at [phone].

## 2015-01-21 NOTE — ED Notes (Addendum)
Patient states has not taken Cialis in 3 weeks, Dr. Justin Mend aware and states OK to give Nitro Paste at this time.

## 2015-01-21 NOTE — Progress Notes (Signed)
Patient arrived from the ED. Patient A/O, VSS, NSR on the monitor. Report to be given to oncoming RN. Afleming, RN

## 2015-01-21 NOTE — Progress Notes (Signed)
Report received on ED patient going to 2w31 from [place]. Afleming, RN

## 2015-01-21 NOTE — ED Provider Notes (Signed)
CSN: [id number]     Arrival date & time 01/21/15  1452 History   First MD Initiated Contact with Patient 01/21/15 1531     Chief Complaint  Patient presents with  . Near Syncope  . Atrial Fibrillation     (Consider location/radiation/quality/duration/timing/severity/associated sxs/prior Treatment) HPI Comments: 64 yo M hx of HLD, GERD, DM controlled by diet/exercise, BPH, Gout, presents with CC of abnormal EKG.  Pt states he has had "cold like symptoms" including nonproductive cough, nasal congestion starting Monday.  Those symptoms have improved.  Today pt has felt lightheaded, palpitations, SOB with exertion, has had some left-sided twinge of CP which has resolved on own.  Pt was taking shower this morning, and nearly passed out.  Pt denies fever, chills, cough, abdominal pain, nausea, vomiting, diarrhea, rash, myalgias, or any other symptoms.  No recent medication changes.  Pt had been taking OTC medications for cold like symptoms, including Mucinex, but did not take anything today.  Pt saw his PCP, and was noted to be in atrial fibrillation with RVR.  He was sent to ED for further evaluation and treatment.    The history is provided by the patient. No language interpreter was used.    Past Medical History  Diagnosis Date  . Hyperlipidemia   . Allergy   . GERD (gastroesophageal reflux disease)   . Diabetes mellitus   . BPH (benign prostatic hyperplasia)   . Gout   . Obesity    Past Surgical History  Procedure Laterality Date  . Colonoscopy  2010    MEDOFF  . Total knee arthroplasty  2001    LEFT MURPHY   History reviewed. No pertinent family history. History  Substance Use Topics  . Smoking status: Never Smoker   . Smokeless tobacco: Never Used  . Alcohol Use: Not on file    Review of Systems  Constitutional: Negative for fever and chills.  Respiratory: Positive for cough. Negative for shortness of breath.   Cardiovascular: Positive for chest pain and palpitations.  Negative for leg swelling.  Gastrointestinal: Negative for nausea, vomiting, abdominal pain and diarrhea.  Genitourinary: Negative for dysuria.  Musculoskeletal: Negative for myalgias.  Skin: Negative for rash.  Neurological: Negative for dizziness, weakness, light-headedness, numbness and headaches.  Hematological: Negative for adenopathy. Does not bruise/bleed easily.  All other systems reviewed and are negative.     Allergies  Codeine  Home Medications   Prior to Admission medications   Medication Sig Start Date End Date Taking? Authorizing Provider  albuterol (PROVENTIL HFA;VENTOLIN HFA) 108 (90 BASE) MCG/ACT inhaler Inhale 2 puffs into the lungs every 6 (six) hours as needed for wheezing. 12/05/11 01/21/15  Denita Lung, MD  allopurinol (ZYLOPRIM) 300 MG tablet Take 300 mg by mouth daily.      Historical Provider, MD  ASMANEX 30 METERED DOSES 220 MCG/INH inhaler INHALE TWO PUFFS BY MOUTH TWICE DAILY    Denita Lung, MD  aspirin 81 MG tablet Take 81 mg by mouth daily.      Historical Provider, MD  atorvastatin (LIPITOR) 20 MG tablet Take 1 tablet (20 mg total) by mouth daily at 6 PM. 09/23/14   Denita Lung, MD  buPROPion (WELLBUTRIN XL) 300 MG 24 hr tablet Take 1 tablet (300 mg total) by mouth daily. 09/23/14   Denita Lung, MD  fish oil-omega-3 fatty acids 1000 MG capsule Take 2 g by mouth daily.      Historical Provider, MD  lisinopril-hydrochlorothiazide (PRINZIDE,ZESTORETIC) 10-12.5 MG per  tablet TAKE 1 TABLET BY MOUTH EVERY DAY Patient not taking: Reported on 01/21/2015 10/31/14   Denita Lung, MD  Multiple Vitamins-Minerals (MULTIVITAMIN WITH MINERALS) tablet Take 1 tablet by mouth daily.      Historical Provider, MD  silodosin (RAPAFLO) 4 MG CAPS capsule Take 8 mg by mouth daily with breakfast.    Historical Provider, MD  tadalafil (CIALIS) 5 MG tablet Take 5 mg by mouth daily.    Historical Provider, MD  zolpidem (AMBIEN) 10 MG tablet TAKE 1 TABLET BY MOUTH EVERY DAY  AT BEDTIME AS NEEDED FOR SLEEP 12/03/14   Denita Lung, MD   BP 109/73 mmHg  Pulse 86  Temp(Src) 97.3 F (36.3 C) (Oral)  Resp 18  Ht 6' (1.829 m)  Wt 230 lb (104.327 kg)  BMI 31.19 kg/m2  SpO2 96% Physical Exam  Constitutional: He is oriented to person, place, and time. He appears well-developed and well-nourished.  HENT:  Head: Normocephalic and atraumatic.  Right Ear: External ear normal.  Left Ear: External ear normal.  Mouth/Throat: Oropharynx is clear and moist.  Eyes: Conjunctivae and EOM are normal. Pupils are equal, round, and reactive to light.  Neck: Normal range of motion. Neck supple.  Cardiovascular: Normal rate, regular rhythm and intact distal pulses.  Exam reveals no gallop and no friction rub.   Murmur heard. Pulmonary/Chest: Effort normal and breath sounds normal. No respiratory distress. He has no wheezes. He has no rales. He exhibits no tenderness.  Abdominal: Soft. Bowel sounds are normal. He exhibits no distension and no mass. There is no tenderness. There is no rebound and no guarding.  Musculoskeletal: Normal range of motion.  Neurological: He is alert and oriented to person, place, and time.  Skin: Skin is warm and dry.  Nursing note and vitals reviewed.   ED Course  Procedures (including critical care time) Labs Review Labs Reviewed  BASIC METABOLIC PANEL - Abnormal; Notable for the following:    Glucose, Bld 106 (*)    All other components within normal limits  BRAIN NATRIURETIC PEPTIDE - Abnormal; Notable for the following:    B Natriuretic Peptide 258.0 (*)    All other components within normal limits  CBC  MAGNESIUM  TSH  I-STAT TROPOININ, ED  Randolm Idol, ED    Imaging Review Dg Chest 2 View  01/21/2015   CLINICAL DATA:  Shortness of breath with near syncopal episode showering this morning. History of atrial fibrillation. Initial encounter.  EXAM: CHEST  2 VIEW  COMPARISON:  01/30/2006 radiographs.  FINDINGS: The heart size and  mediastinal contours are normal. The lungs are clear. There is no pleural effusion or pneumothorax. No acute osseous findings are identified.  IMPRESSION: Stable examination.  No active cardiopulmonary process.   Electronically Signed   By: Richardean Sale M.D.   On: 01/21/2015 15:32     EKG Interpretation   Date/Time:  Wednesday January 21 2015 14:55:09 EST Ventricular Rate:  146 PR Interval:    QRS Duration: 92 QT Interval:  304 QTC Calculation: 473 R Axis:   28 Text Interpretation:  Atrial fibrillation with rapid ventricular response  Abnormal ECG Atrial fibrillation with rapid ventricular response Abnormal  ekg Confirmed by Carmin Muskrat  MD ([id number]) on 01/21/2015 3:37:11 PM      MDM   Final diagnoses:  Atrial fibrillation with RVR   64 yo M hx of HLD, GERD, DM controlled by diet/exercise, BPH, Gout, presents with CC of abnormal EKG.   Physical exam  as above.  VS WNL currently.  EKG from PCP demonstrates atrial fibrillation to 146.  Pt has apparently self-converted back into NSR with rate in 80's.  Pt currently asymptomatic, but states when standing or walking he becomes SOB, lightheaded.    Labs demonstrate troponin WNL, CBC WNL, BMP WNL, BNP mild, TSH WNL.    CXR WNL.    Pt c/o CP, 4/10 left sided, given ASA and nitro.  Cardiology consulted.  Pt to be admitted for further evluation and management of new onset atrial fibrillation, and CP r/o.   Pt understands and agrees with plan.   Discussed pt with Dr. Vanita Panda.   Sinda Du       Sinda Du, MD 01/22/15 [facility]  Carmin Muskrat, MD 01/23/15 [id number]

## 2015-01-21 NOTE — ED Notes (Signed)
Dr. Vanita Panda at bedside with ED Resident.

## 2015-01-21 NOTE — H&P (Signed)
Patient ID: Jeremy Tucker MRN: [id number], DOB/AGE: 11-23-1950   Admit date: 01/21/2015   Primary Physician: [name], MD Primary Cardiologist: Dr Tamala Julian  HPI: 64 y/o followed in the past by Dr Melvern Banker, now Dr Tamala Julian. He has a history of remote normal coronaries by cath. He has HTN, mild AS, and diastolic dysfunction. Recently he has had a URI and had been taking Mucinex -D and pseudoephedrine. Today he went to Dr Lanice Shirts office with complaint of weakness. His EKG showed AF with RVR. This was confirmed in the ED. He went for a CXR and converted spontaneously to NSR. He was unaware of any tachycardia. He admits he has had some localized Lt sided chest pain that does not sound like angina.    Problem List: Past Medical History  Diagnosis Date  . Hyperlipidemia   . Allergy   . GERD (gastroesophageal reflux disease)   . Diabetes mellitus   . BPH (benign prostatic hyperplasia)   . Gout   . Obesity     Past Surgical History  Procedure Laterality Date  . Colonoscopy  2010    MEDOFF  . Total knee arthroplasty  2001    LEFT MURPHY     Allergies:  Allergies  Allergen Reactions  . Codeine      Home Medications No current facility-administered medications for this encounter.   Current Outpatient Prescriptions  Medication Sig Dispense Refill  . albuterol (PROVENTIL HFA;VENTOLIN HFA) 108 (90 BASE) MCG/ACT inhaler Inhale 2 puffs into the lungs every 6 (six) hours as needed for wheezing. 1 Inhaler 0  . allopurinol (ZYLOPRIM) 300 MG tablet Take 300 mg by mouth daily.      . ASMANEX 30 METERED DOSES 220 MCG/INH inhaler INHALE TWO PUFFS BY MOUTH TWICE DAILY 1 Inhaler 11  . aspirin 81 MG tablet Take 81 mg by mouth daily.      Marland Kitchen atorvastatin (LIPITOR) 20 MG tablet Take 1 tablet (20 mg total) by mouth daily at 6 PM. 90 tablet 0  . buPROPion (WELLBUTRIN XL) 300 MG 24 hr tablet Take 1 tablet (300 mg total) by mouth daily. 90 tablet 1  . fish oil-omega-3 fatty acids 1000 MG  capsule Take 2 g by mouth daily.      Marland Kitchen lisinopril-hydrochlorothiazide (PRINZIDE,ZESTORETIC) 10-12.5 MG per tablet TAKE 1 TABLET BY MOUTH EVERY DAY (Patient not taking: Reported on 01/21/2015) 90 tablet 0  . Multiple Vitamins-Minerals (MULTIVITAMIN WITH MINERALS) tablet Take 1 tablet by mouth daily.      . silodosin (RAPAFLO) 4 MG CAPS capsule Take 8 mg by mouth daily with breakfast.    . tadalafil (CIALIS) 5 MG tablet Take 5 mg by mouth daily.    Marland Kitchen zolpidem (AMBIEN) 10 MG tablet TAKE 1 TABLET BY MOUTH EVERY DAY AT BEDTIME AS NEEDED FOR SLEEP 90 tablet 0     History reviewed. No pertinent family history. Grandfather had an MI in his 11s  History   Social History  . Marital Status: Married    Spouse Name: N/A  . Number of Children: N/A  . Years of Education: N/A   Occupational History  . Not on file.   Social History Main Topics  . Smoking status: Never Smoker   . Smokeless tobacco: Never Used  . Alcohol Use: Not on file  . Drug Use: Not on file  . Sexual Activity: Not on file   Other Topics Concern  . Not on file   Social History Narrative  Review of Systems: General: negative for chills, fever, night sweats or weight changes.  Cardiovascular: negative for chest pain, dyspnea on exertion, edema, orthopnea, palpitations, paroxysmal nocturnal dyspnea or shortness of breath Dermatological: negative for rash Respiratory: negative for cough or wheezing Urologic: negative for hematuria Abdominal: negative for nausea, vomiting, diarrhea, bright red blood per rectum, melena, or hematemesis Neurologic: negative for visual changes, syncope, or dizziness Asthma, ED, prior kidney stones, s/p TKR, BPH, DJD, Glucose intol All other systems reviewed and are otherwise negative except as noted above.  Physical Exam: Blood pressure 132/79, pulse 80, temperature 97.3 F (36.3 C), temperature source Oral, resp. rate 16, height 6' (1.829 m), weight 230 lb (104.327 kg), SpO2 92 %.    General appearance: alert, cooperative, no distress and mildly obese Neck: no carotid bruit and no JVD Lungs: rhonchi Lt base Heart: regular rate and rhythm and 2/6 systolic murmur AoV area Abdomen: soft, non-tender; bowel sounds normal; no masses,  no organomegaly Extremities: extremities normal, atraumatic, no cyanosis or edema Pulses: 2+ and symmetric Skin: Skin color, texture, turgor normal. No rashes or lesions Neurologic: Grossly normal    Labs:   Results for orders placed or performed during the hospital encounter of 01/21/15 (from the past 24 hour(s))  CBC     Status: None   Collection Time: 01/21/15  2:59 PM  Result Value Ref Range   WBC 10.1 4.0 - 10.5 K/uL   RBC 5.36 4.22 - 5.81 MIL/uL   Hemoglobin 16.7 13.0 - 17.0 g/dL   HCT 47.2 39.0 - 52.0 %   MCV 88.1 78.0 - 100.0 fL   MCH 31.2 26.0 - 34.0 pg   MCHC 35.4 30.0 - 36.0 g/dL   RDW 12.4 11.5 - 15.5 %   Platelets 232 150 - 400 K/uL  BNP (order ONLY if patient complains of dyspnea/SOB AND you have documented it for THIS visit)     Status: Abnormal   Collection Time: 01/21/15  2:59 PM  Result Value Ref Range   B Natriuretic Peptide 258.0 (H) 0.0 - 100.0 pg/mL  I-stat troponin, ED (not at [facility])     Status: None   Collection Time: 01/21/15  3:19 PM  Result Value Ref Range   Troponin i, poc 0.03 0.00 - 0.08 ng/mL   Comment 3          Magnesium     Status: None   Collection Time: 01/21/15  3:40 PM  Result Value Ref Range   Magnesium 2.0 1.5 - 2.5 mg/dL  I-Stat Troponin, ED (not at [facility])     Status: None   Collection Time: 01/21/15  4:00 PM  Result Value Ref Range   Troponin i, poc 0.02 0.00 - 0.08 ng/mL   Comment 3             Radiology/Studies: Dg Chest 2 View  01/21/2015   CLINICAL DATA:  Shortness of breath with near syncopal episode showering this morning. History of atrial fibrillation. Initial encounter.  EXAM: CHEST  2 VIEW  COMPARISON:  01/30/2006 radiographs.  FINDINGS: The heart size and mediastinal  contours are normal. The lungs are clear. There is no pleural effusion or pneumothorax. No acute osseous findings are identified.  IMPRESSION: Stable examination.  No active cardiopulmonary process.   Electronically Signed   By: Richardean Sale M.D.   On: 01/21/2015 15:32    EKG: Now NSR without acute changes  ASSESSMENT AND PLAN:  Principal Problem:   Atrial fibrillation with RVR Active Problems:  Fatigue   Diastolic dysfunction-grade 15 Sep 2014   Hyperlipidemia LDL goal <70   Glucose intolerance (impaired glucose tolerance)   Obesity (BMI 30-39.9)   Asthma   Aortic stenosis, mild-Nov 2015   PLAN: Check TSH, He should avoid pseudoephedrine and any decongestants with "D" in the labile. His Troponin is negative x 2. ? Keep overnight for observation MD to see.    Henri Medal, PA-C 01/21/2015, 4:51 PM   Agree with note written by Kerin Ransom PAC  PAF in setting of URI and use of decongestants. No H/O CAD. Nl cath remotely. Mild AS by 2D. Spontaneously converted to NSR. Feels better. Exam benign. Labs OK. Enz neg. EKG w/o acute changes. Would keep over night for obs. He has recently c/o atyp CP. Can prob go home in AM. Will need OP myoview then ROV with [place] J 01/21/2015 5:36 PM

## 2015-01-21 NOTE — ED Notes (Signed)
Cardiology at bedside.

## 2015-01-22 ENCOUNTER — Encounter (HOSPITAL_COMMUNITY): Payer: Self-pay | Admitting: Physician Assistant

## 2015-01-22 ENCOUNTER — Telehealth: Payer: Self-pay | Admitting: Interventional Cardiology

## 2015-01-22 DIAGNOSIS — I350 Nonrheumatic aortic (valve) stenosis: Secondary | ICD-10-CM | POA: Diagnosis not present

## 2015-01-22 DIAGNOSIS — I519 Heart disease, unspecified: Secondary | ICD-10-CM | POA: Diagnosis not present

## 2015-01-22 DIAGNOSIS — I480 Paroxysmal atrial fibrillation: Secondary | ICD-10-CM | POA: Diagnosis not present

## 2015-01-22 DIAGNOSIS — E669 Obesity, unspecified: Secondary | ICD-10-CM

## 2015-01-22 DIAGNOSIS — R5383 Other fatigue: Secondary | ICD-10-CM | POA: Diagnosis not present

## 2015-01-22 DIAGNOSIS — E785 Hyperlipidemia, unspecified: Secondary | ICD-10-CM | POA: Diagnosis not present

## 2015-01-22 DIAGNOSIS — J45909 Unspecified asthma, uncomplicated: Secondary | ICD-10-CM | POA: Diagnosis not present

## 2015-01-22 DIAGNOSIS — I4891 Unspecified atrial fibrillation: Secondary | ICD-10-CM | POA: Diagnosis not present

## 2015-01-22 NOTE — Progress Notes (Signed)
Subjective:  Feels well now. Symptoms were not of palpitations but of weakness and dyspnea with PAF, cold virus.   Objective:  Vital Signs in the last 24 hours: Temp:  [97.3 F (36.3 C)-98.8 F (37.1 C)] 98.8 F (37.1 C) (03/10 0619) Pulse Rate:  [73-93] 93 (03/10 0619) Resp:  [14-21] 18 (03/10 0619) BP: (106-151)/(67-97) 138/67 mmHg (03/10 0619) SpO2:  [92 %-98 %] 93 % (03/10 0619) Weight:  [230 lb (104.327 kg)] 230 lb (104.327 kg) (03/09 1844)  Intake/Output from previous day:     Physical Exam: General: Well developed, well nourished, in no acute distress. Head:  Normocephalic and atraumatic. Lungs: Clear to auscultation and percussion. Heart: Normal S1 and S2.  No murmur, rubs or gallops.  Abdomen: soft, non-tender, positive bowel sounds. overweight Extremities: No clubbing or cyanosis. No edema. Neurologic: Alert and oriented x 3.    Lab Results:  Recent Labs  01/21/15 1459  WBC 10.1  HGB 16.7  PLT 232    Recent Labs  01/21/15 1459  NA 137  K 3.6  CL 100  CO2 24  GLUCOSE 106*  BUN 16  CREATININE 0.81    Imaging: Dg Chest 2 View  01/21/2015   CLINICAL DATA:  Shortness of breath with near syncopal episode showering this morning. History of atrial fibrillation. Initial encounter.  EXAM: CHEST  2 VIEW  COMPARISON:  01/30/2006 radiographs.  FINDINGS: The heart size and mediastinal contours are normal. The lungs are clear. There is no pleural effusion or pneumothorax. No acute osseous findings are identified.  IMPRESSION: Stable examination.  No active cardiopulmonary process.   Electronically Signed   By: Richardean Sale M.D.   On: 01/21/2015 15:32   Personally viewed.   Telemetry: PAC's no further AFIB Personally viewed.  Cardiac Studies:    ECHO 09/25/14- Left ventricle: The cavity size was normal. Systolic function was normal. The estimated ejection fraction was in the range of 60% to 65%. Wall motion was normal; there were no regional  wall motion abnormalities. Features are consistent with a pseudonormal left ventricular filling pattern, with concomitant abnormal relaxation and increased filling pressure (grade 2 diastolic dysfunction). - Aortic valve: There was mild stenosis. There was mild regurgitation. Mean gradient (S): 12 mm Hg. Peak gradient (S): 25 mm Hg. Valve area (VTI): 1.82 cm^2. Valve area (Vmax): 1.76 cm^2. Valve area (Vmean): 1.58 cm^2. - Aortic root: The aortic root was normal in size. - Left atrium: The atrium was mildly dilated. - Right ventricle: Systolic function was normal. - Right atrium: The atrium was normal in size. - Tricuspid valve: There was trivial regurgitation. - Pulmonic valve: There was no regurgitation. - Pulmonary arteries: Systolic pressure was within the normal range. - Inferior vena cava: The vessel was normal in size. - Pericardium, extracardiac: There was no pericardial effusion.  Assessment/Plan:  Principal Problem:   Atrial fibrillation with RVR Active Problems:   Hyperlipidemia LDL goal <70   Glucose intolerance (impaired glucose tolerance)   Obesity (BMI 30-39.9)   Asthma   Aortic stenosis, mild-Nov 2015   Fatigue   Diastolic dysfunction-grade 15 Sep 2014   PAF (paroxysmal atrial fibrillation)  64 year old with PAF, mild AS, obesity.  PAF  - secondary cold virus, sudafed or mucinex D  - If returns, diltiazem could be added  - CHADS-VASc now is 0 (used to take ace/hctz but is no longer on medication secondary to orthostatic side effect, BP has been normal at home)    Obesity  -  weight loss discussed as improving PAF  - He does not snore  Mild AS  - clinically monitor  Atypical CP  - right shoulder lateral discomfort non exertional  - Monitor clinically, sounds non cardiac.    OK for DC  F/U with Dr. Tamala Julian in 2 weeks    [place], [facility] 01/22/2015, 9:42 AM

## 2015-01-22 NOTE — Progress Notes (Signed)
UR completed 

## 2015-01-22 NOTE — Progress Notes (Signed)
Patient being discharged to home. Discharge instructions given to the patient and his wife, both verbalized understanding of instructions given. [place], RN

## 2015-01-22 NOTE — Telephone Encounter (Signed)
New message    tcm appt on 3.21.2016 with Tera Helper.    Per Joellen Jersey T

## 2015-01-22 NOTE — Discharge Summary (Signed)
Discharge Summary   Patient ID: Jeremy Tucker MRN: [id number], DOB/AGE: Jan 30, 1951 64 y.o. Admit date: 01/21/2015 D/C date:     01/22/2015  Primary Cardiologist: Dr. Faythe Casa   Principal Problem:   Atrial fibrillation with RVR Active Problems:   Hyperlipidemia LDL goal <70   Glucose intolerance (impaired glucose tolerance)   Obesity (BMI 30-39.9)   Asthma   Aortic stenosis, mild-Nov 2015   Fatigue   Diastolic dysfunction-grade 15 Sep 2014   PAF (paroxysmal atrial fibrillation)   Admission Dates: 01/21/15-01/22/15 Discharge Diagnosis: Atrial fib with RVR in setting of URI and use of decongestants. Spontaneously converted to NSR.     HPI: Jeremy Tucker is a 64 y.o. male with a history of PAF, mild AS, HLD, GERD, gout, diastolic dysfunction and obesity who presented to [facility] on 01/21/15 with atrial fibrillation with RVR and was admitted overnight for observation.   Recently he has had a URI and had been taking Mucinex -D and pseudoephedrine. He went to Dr Lanice Shirts office with complaint of weakness. His EKG showed AF with RVR and he was sent to [facility] ED. This was confirmed in the ED. He went for a CXR and converted spontaneously to NSR. He was unaware of any tachycardia. He admits he has had some localized Lt sided chest pain that does not sound like angina.    Hospital Course  PAF -- Secondary cold virus, sudafed or mucinex D -- TSH normal -- If returns, diltiazem could be added -- CHADS-VASc now is 0 (used to take ace/hctz but is no longer on medication secondary to orthostatic side effect, BP has been normal at home)  Obesity -- Weight loss discussed as improving PAF -- He does not snore  Mild AS -- Clinically monitor  Atypical CP -- Right shoulder lateral discomfort non exertional. Troponin negative x2 -- Monitor clinically, sounds non cardiac. Will have follow up in clinic in 2 weeks. If still having sx then may be set up for outpatient stress test.   Chronic diastolic CHF- no s/s  volume overload.   HLD- continue statin and fish oil  GERD- continue PPI  The patient has had an uncomplicated hospital course and is recovering well. He has been seen by Dr. Marlou Porch today and deemed ready for discharge home. All follow-up appointments have been scheduled. Discharge medications are listed below.   Discharge Vitals: Blood pressure 138/67, pulse 93, temperature 98.8 F (37.1 C), temperature source Oral, resp. rate 18, height 6' (1.829 m), weight 230 lb (104.327 kg), SpO2 93 %.  Labs: Lab Results  Component Value Date   WBC 10.1 01/21/2015   HGB 16.7 01/21/2015   HCT 47.2 01/21/2015   MCV 88.1 01/21/2015   PLT 232 01/21/2015     Recent Labs Lab 01/21/15 1459  NA 137  K 3.6  CL 100  CO2 24  BUN 16  CREATININE 0.81  CALCIUM 9.6  GLUCOSE 106*    Lab Results  Component Value Date   CHOL 153 09/23/2014   HDL 43 09/23/2014   LDLCALC 63 09/23/2014   TRIG 234* 09/23/2014     Diagnostic Studies/Procedures   Dg Chest 2 View  01/21/2015   CLINICAL DATA:  Shortness of breath with near syncopal episode showering this morning. History of atrial fibrillation. Initial encounter.  EXAM: CHEST  2 VIEW  COMPARISON:  01/30/2006 radiographs.  FINDINGS: The heart size and mediastinal contours are normal. The lungs are clear. There is no pleural effusion or pneumothorax. No acute osseous  findings are identified.  IMPRESSION: Stable examination.  No active cardiopulmonary process.   Electronically Signed   By: Richardean Sale M.D.   On: 01/21/2015 15:32    Discharge Medications     Medication List    STOP taking these medications        lisinopril-hydrochlorothiazide 10-12.5 MG per tablet  Commonly known as:  PRINZIDE,ZESTORETIC      TAKE these medications        albuterol 108 (90 BASE) MCG/ACT inhaler  Commonly known as:  PROVENTIL HFA;VENTOLIN HFA  Inhale 2 puffs into the lungs every 6 (six) hours as needed for wheezing.     allopurinol 300 MG tablet    Commonly known as:  ZYLOPRIM  Take 300 mg by mouth daily.     ASMANEX 30 METERED DOSES 220 MCG/INH inhaler  Generic drug:  mometasone  INHALE TWO PUFFS BY MOUTH TWICE DAILY     aspirin 81 MG tablet  Take 81 mg by mouth daily.     atorvastatin 20 MG tablet  Commonly known as:  LIPITOR  Take 1 tablet (20 mg total) by mouth daily at 6 PM.     buPROPion 300 MG 24 hr tablet  Commonly known as:  WELLBUTRIN XL  Take 1 tablet (300 mg total) by mouth daily.     fish oil-omega-3 fatty acids 1000 MG capsule  Take 1 g by mouth daily.     multivitamin with minerals tablet  Take 1 tablet by mouth daily.     olopatadine 0.1 % ophthalmic solution  Commonly known as:  PATANOL  Place 1 drop into both eyes daily as needed for allergies.     omeprazole 20 MG capsule  Commonly known as:  PRILOSEC  Take 20 mg by mouth daily.     silodosin 4 MG Caps capsule  Commonly known as:  RAPAFLO  Take 4 mg by mouth every evening.     tadalafil 5 MG tablet  Commonly known as:  CIALIS  Take 5 mg by mouth daily as needed for erectile dysfunction.     zolpidem 10 MG tablet  Commonly known as:  AMBIEN  TAKE 1 TABLET BY MOUTH EVERY DAY AT BEDTIME AS NEEDED FOR SLEEP        Disposition   The patient will be discharged in stable condition to home.  Follow-up Information    Follow up with Truitt Merle, NP On 02/02/2015.   Specialty:  Nurse Practitioner   Why:  @ 8:30am   Contact information:   [place]  [id number] [phone]         Duration of Discharge Encounter: Greater than 30 minutes including physician and PA time.  Signed, [place] Silos, KATHRYN R PA-C 01/22/2015, 10:15 AM

## 2015-01-23 NOTE — Telephone Encounter (Signed)
Patient contacted regarding discharge from [facility] on 01/22/15.  Patient understands to follow up with provider Myna Hidalgo on 02/02/15  At 8:30AM at [facility]. Patient understands discharge instructions? yes Patient understands medications and regiment? yes Patient understands to bring all medications to this visit? yes

## 2015-01-26 ENCOUNTER — Ambulatory Visit (INDEPENDENT_AMBULATORY_CARE_PROVIDER_SITE_OTHER): Payer: BLUE CROSS/BLUE SHIELD | Admitting: Family Medicine

## 2015-01-26 ENCOUNTER — Encounter (HOSPITAL_COMMUNITY): Payer: Self-pay | Admitting: Emergency Medicine

## 2015-01-26 ENCOUNTER — Emergency Department (HOSPITAL_COMMUNITY): Payer: BLUE CROSS/BLUE SHIELD

## 2015-01-26 ENCOUNTER — Inpatient Hospital Stay (HOSPITAL_COMMUNITY)
Admission: EM | Admit: 2015-01-26 | Discharge: 2015-01-28 | DRG: 308 | Disposition: A | Discharge status: Home / Self Care | Payer: BLUE CROSS/BLUE SHIELD | Attending: Interventional Cardiology | Admitting: Interventional Cardiology

## 2015-01-26 ENCOUNTER — Telehealth: Payer: Self-pay | Admitting: Internal Medicine

## 2015-01-26 VITALS — BP 140/98 | HR 128 | Resp 24

## 2015-01-26 DIAGNOSIS — E785 Hyperlipidemia, unspecified: Secondary | ICD-10-CM | POA: Diagnosis present

## 2015-01-26 DIAGNOSIS — R0600 Dyspnea, unspecified: Secondary | ICD-10-CM

## 2015-01-26 DIAGNOSIS — R7302 Impaired glucose tolerance (oral): Secondary | ICD-10-CM | POA: Diagnosis present

## 2015-01-26 DIAGNOSIS — I350 Nonrheumatic aortic (valve) stenosis: Secondary | ICD-10-CM | POA: Diagnosis present

## 2015-01-26 DIAGNOSIS — K219 Gastro-esophageal reflux disease without esophagitis: Secondary | ICD-10-CM | POA: Diagnosis present

## 2015-01-26 DIAGNOSIS — I5033 Acute on chronic diastolic (congestive) heart failure: Secondary | ICD-10-CM | POA: Diagnosis present

## 2015-01-26 DIAGNOSIS — N400 Enlarged prostate without lower urinary tract symptoms: Secondary | ICD-10-CM | POA: Diagnosis present

## 2015-01-26 DIAGNOSIS — M109 Gout, unspecified: Secondary | ICD-10-CM | POA: Diagnosis present

## 2015-01-26 DIAGNOSIS — Z7901 Long term (current) use of anticoagulants: Secondary | ICD-10-CM

## 2015-01-26 DIAGNOSIS — Z86718 Personal history of other venous thrombosis and embolism: Secondary | ICD-10-CM

## 2015-01-26 DIAGNOSIS — I5189 Other ill-defined heart diseases: Secondary | ICD-10-CM | POA: Diagnosis present

## 2015-01-26 DIAGNOSIS — Z96652 Presence of left artificial knee joint: Secondary | ICD-10-CM

## 2015-01-26 DIAGNOSIS — I4891 Unspecified atrial fibrillation: Secondary | ICD-10-CM | POA: Insufficient documentation

## 2015-01-26 DIAGNOSIS — E669 Obesity, unspecified: Secondary | ICD-10-CM | POA: Diagnosis present

## 2015-01-26 DIAGNOSIS — E1169 Type 2 diabetes mellitus with other specified complication: Secondary | ICD-10-CM | POA: Diagnosis present

## 2015-01-26 DIAGNOSIS — Z79899 Other long term (current) drug therapy: Secondary | ICD-10-CM

## 2015-01-26 DIAGNOSIS — I480 Paroxysmal atrial fibrillation: Principal | ICD-10-CM | POA: Diagnosis present

## 2015-01-26 DIAGNOSIS — J45909 Unspecified asthma, uncomplicated: Secondary | ICD-10-CM | POA: Diagnosis present

## 2015-01-26 DIAGNOSIS — Z7982 Long term (current) use of aspirin: Secondary | ICD-10-CM

## 2015-01-26 DIAGNOSIS — E118 Type 2 diabetes mellitus with unspecified complications: Secondary | ICD-10-CM | POA: Diagnosis present

## 2015-01-26 LAB — CBC
HCT: 48.8 % (ref 39.0–52.0)
Hemoglobin: 17.7 g/dL — ABNORMAL HIGH (ref 13.0–17.0)
MCH: 32.5 pg (ref 26.0–34.0)
MCHC: 36.3 g/dL — ABNORMAL HIGH (ref 30.0–36.0)
MCV: 89.5 fL (ref 78.0–100.0)
Platelets: 269 10*3/uL (ref 150–400)
RBC: 5.45 MIL/uL (ref 4.22–5.81)
RDW: 12.6 % (ref 11.5–15.5)
WBC: 15.5 10*3/uL — ABNORMAL HIGH (ref 4.0–10.5)

## 2015-01-26 LAB — TROPONIN I: Troponin I: <0.03 ng/mL (ref <0.031)

## 2015-01-26 LAB — BASIC METABOLIC PANEL
Anion gap: 11 (ref 5–15)
BUN: 15 mg/dL (ref 6–23)
CO2: 23 mmol/L (ref 19–32)
Calcium: 10.2 mg/dL (ref 8.4–10.5)
Chloride: 103 mmol/L (ref 96–112)
Creatinine, Ser: 0.96 mg/dL (ref 0.50–1.35)
GFR calc Af Amer: >90 mL/min (ref >90)
GFR calc non Af Amer: 86 mL/min — ABNORMAL LOW (ref >90)
Glucose, Bld: 161 mg/dL — ABNORMAL HIGH (ref 70–99)
Potassium: 3.9 mmol/L (ref 3.5–5.1)
Sodium: 137 mmol/L (ref 135–145)

## 2015-01-26 LAB — BRAIN NATRIURETIC PEPTIDE: B Natriuretic Peptide: 121.4 pg/mL — ABNORMAL HIGH (ref 0.0–100.0)

## 2015-01-26 LAB — MAGNESIUM: Magnesium: 1.8 mg/dL (ref 1.5–2.5)

## 2015-01-26 LAB — TSH: TSH: 1.309 u[IU]/mL (ref 0.350–4.500)

## 2015-01-26 MED ORDER — ALBUTEROL SULFATE HFA 108 (90 BASE) MCG/ACT IN AERS: 2.0000 | INHALATION_SPRAY | Freq: Four times a day (QID) | RESPIRATORY_TRACT | Status: DC | PRN

## 2015-01-26 MED ORDER — APIXABAN 5 MG PO TABS
5.0000 mg | ORAL_TABLET | Freq: Two times a day (BID) | ORAL | Status: DC
  Administered 2015-01-26 – 2015-01-28 (×4): 5 mg via ORAL
  Filled 2015-01-26 (×4): qty 1

## 2015-01-26 MED ORDER — ACETAMINOPHEN 500 MG PO TABS
1000.0000 mg | ORAL_TABLET | Freq: Once | ORAL | Status: AC
  Administered 2015-01-26: 1000 mg via ORAL
  Filled 2015-01-26: qty 2

## 2015-01-26 MED ORDER — ACETAMINOPHEN 325 MG PO TABS: 650.0000 mg | ORAL_TABLET | ORAL | Status: DC | PRN

## 2015-01-26 MED ORDER — ATORVASTATIN CALCIUM 20 MG PO TABS
20.0000 mg | ORAL_TABLET | Freq: Every day | ORAL | Status: DC
  Administered 2015-01-26 – 2015-01-27 (×2): 20 mg via ORAL
  Filled 2015-01-26 (×2): qty 1

## 2015-01-26 MED ORDER — TAMSULOSIN HCL 0.4 MG PO CAPS
0.4000 mg | ORAL_CAPSULE | Freq: Every day | ORAL | Status: DC
  Administered 2015-01-26 – 2015-01-27 (×2): 0.4 mg via ORAL
  Filled 2015-01-26 (×2): qty 1

## 2015-01-26 MED ORDER — METOPROLOL TARTRATE 25 MG PO TABS: 25.0000 mg | ORAL_TABLET | Freq: Two times a day (BID) | ORAL | Status: DC

## 2015-01-26 MED ORDER — SOTALOL HCL 80 MG PO TABS
80.0000 mg | ORAL_TABLET | Freq: Two times a day (BID) | ORAL | Status: DC
  Administered 2015-01-26 – 2015-01-27 (×2): 80 mg via ORAL
  Filled 2015-01-26 (×4): qty 1

## 2015-01-26 MED ORDER — DEXTROSE 5 % IV SOLN
5.0000 mg/h | INTRAVENOUS | Status: DC
  Administered 2015-01-26: 5 mg/h via INTRAVENOUS
  Administered 2015-01-26: 8 mg/h via INTRAVENOUS
  Administered 2015-01-27: 5 mg/h via INTRAVENOUS
  Filled 2015-01-26 (×3): qty 100

## 2015-01-26 MED ORDER — PANTOPRAZOLE SODIUM 40 MG PO TBEC
40.0000 mg | DELAYED_RELEASE_TABLET | Freq: Every day | ORAL | Status: DC
  Administered 2015-01-27 – 2015-01-28 (×2): 40 mg via ORAL
  Filled 2015-01-26 (×2): qty 1

## 2015-01-26 MED ORDER — NAPROXEN 250 MG PO TABS
250.0000 mg | ORAL_TABLET | Freq: Two times a day (BID) | ORAL | Status: DC | PRN
Start: 2015-01-26 — End: 2015-01-28
  Administered 2015-01-26: 250 mg via ORAL
  Filled 2015-01-26: qty 1

## 2015-01-26 MED ORDER — ALBUTEROL SULFATE (2.5 MG/3ML) 0.083% IN NEBU: 2.5000 mg | INHALATION_SOLUTION | Freq: Four times a day (QID) | RESPIRATORY_TRACT | Status: DC | PRN

## 2015-01-26 MED ORDER — FLUTICASONE PROPIONATE HFA 44 MCG/ACT IN AERO
2.0000 | INHALATION_SPRAY | Freq: Two times a day (BID) | RESPIRATORY_TRACT | Status: DC
  Filled 2015-01-26: qty 10.6

## 2015-01-26 MED ORDER — ZOLPIDEM TARTRATE 5 MG PO TABS
10.0000 mg | ORAL_TABLET | Freq: Every evening | ORAL | Status: DC | PRN
  Administered 2015-01-26 – 2015-01-27 (×2): 10 mg via ORAL
  Filled 2015-01-26 (×2): qty 2

## 2015-01-26 MED ORDER — MULTI-VITAMIN/MINERALS PO TABS: 1.0000 | ORAL_TABLET | Freq: Every day | ORAL | Status: DC

## 2015-01-26 MED ORDER — ADULT MULTIVITAMIN W/MINERALS CH
1.0000 | ORAL_TABLET | Freq: Every day | ORAL | Status: DC
  Administered 2015-01-27 – 2015-01-28 (×2): 1 via ORAL
  Filled 2015-01-26 (×2): qty 1

## 2015-01-26 MED ORDER — OLOPATADINE HCL 0.1 % OP SOLN
1.0000 [drp] | Freq: Two times a day (BID) | OPHTHALMIC | Status: DC
  Filled 2015-01-26: qty 5

## 2015-01-26 MED ORDER — ALLOPURINOL 300 MG PO TABS
300.0000 mg | ORAL_TABLET | Freq: Every day | ORAL | Status: DC
  Administered 2015-01-27 – 2015-01-28 (×2): 300 mg via ORAL
  Filled 2015-01-26 (×2): qty 1

## 2015-01-26 MED ORDER — DILTIAZEM LOAD VIA INFUSION
20.0000 mg | Freq: Once | INTRAVENOUS | Status: AC
  Administered 2015-01-26: 20 mg via INTRAVENOUS
  Filled 2015-01-26: qty 20

## 2015-01-26 MED ORDER — ONDANSETRON HCL 4 MG/2ML IJ SOLN: 4.0000 mg | Freq: Four times a day (QID) | INTRAMUSCULAR | Status: DC | PRN

## 2015-01-26 MED ORDER — MORPHINE SULFATE 2 MG/ML IJ SOLN
2.0000 mg | Freq: Once | INTRAMUSCULAR | Status: AC
  Administered 2015-01-26: 2 mg via INTRAVENOUS
  Filled 2015-01-26: qty 1

## 2015-01-26 NOTE — Telephone Encounter (Signed)
Pt came in and was seen and then sent to ER

## 2015-01-26 NOTE — H&P (Signed)
Admission Note:    Date: 01/26/2015               Patient Name:  Jeremy Tucker MRN: [id number]  DOB: [birth year] Age / Sex: 64 y.o., male        PCP: [name] Primary Cardiologist: Tamala Julian          History of Present Illness: Patient is a 64 y.o. male with a PMHx of atrial fib , who was admitted to [facility] on 01/26/2015 for evaluation of recurrent atrial fib. .  Pt  Was admitted last week with paroxysmal atrial fib.  Converted spontaneously to NSR. Was thought to be due to taking decongestants.   He was discharged from the hospital last week. No palpitations.   Was seen by Dr. Redmond School today with weakness and shortness breath and extreme fatigue. He was found have atrial fibrillation.  + COUGH for the past week , clear No fevers,     Owns a jewelry store .  Non smoker, Occasional ETOH  Medications: Outpatient medications:  (Not in a hospital admission)  Allergies  Allergen Reactions  . Codeine     Itching   . Oxycodone     Itching      Past Medical History  Diagnosis Date  . Hyperlipidemia   . GERD (gastroesophageal reflux disease)   . BPH (benign prostatic hyperplasia)   . Gout   . Obesity   . PAF (paroxysmal atrial fibrillation)     a. CHADsVASc score 0. no longterm AC  . Aortic stenosis, mild   . Kidney stones   . DVT (deep venous thrombosis)   . Depression     Past Surgical History  Procedure Laterality Date  . Colonoscopy  2010    MEDOFF  . Total knee arthroplasty Left 2001    MURPHY  . Tonsillectomy    . Joint replacement    . Knee arthroscopy Bilateral "multiple times"  . Cystoscopy w/ stone manipulation  1980's    "couldn't get stone so they had to cut me open"  . Kidney stone surgery  1980's    "stone lodged in the duct; had to cut me open to get it"  . Fracture surgery    . Forearm fracture surgery Right ~ 1961  . Cardiac catheterization  1990's    "Dr. Melvern Banker"    No family history on file.  Social History:  reports  that he has never smoked. He has never used smokeless tobacco. He reports that he drinks alcohol. He reports that he does not use illicit drugs.   Review of Systems: Constitutional:  admits to decreased  appetite change and fatigue., weakness,   HEENT: denies photophobia, eye pain, redness, hearing loss, ear pain, congestion, sore throat, rhinorrhea, sneezing, neck pain, neck stiffness and tinnitus.  Respiratory: admits to SOB, DOE, cough, chest tightness, and wheezing.  Cardiovascular: denies chest pain, palpitations and leg swelling.  Gastrointestinal: denies nausea, vomiting, abdominal pain, diarrhea, constipation, blood in stool.  Genitourinary: denies dysuria, urgency, frequency, hematuria, flank pain and difficulty urinating.  Musculoskeletal: admits to  Left knee pain  joint swelling,    Skin: denies pallor, rash and wound.  Neurological: denies dizziness, seizures, syncope, weakness, light-headedness, numbness and headaches.   Hematological: denies adenopathy, easy bruising, personal or family bleeding history.  Psychiatric/ Behavioral: denies suicidal ideation, mood changes, confusion, nervousness, sleep disturbance and agitation.    Physical Exam: BP 125/66 mmHg  Pulse 102  Resp 15  SpO2 96%  Wt  Readings from Last 3 Encounters:  01/21/15 230 lb (104.327 kg)  01/21/15 230 lb (104.327 kg)  09/23/14 234 lb (106.142 kg)    General: Vital signs reviewed and noted. Well-developed, well-nourished, in no acute distress; alert,   Head: Normocephalic, atraumatic, sclera anicteric, mucus membranes are moist   Neck: Supple. Negative for carotid bruits. JVD not elevated.   Lungs:  Clear bilaterally to auscultation without wheezes, rales, or rhonchi. Breathing is normal  Heart: Irreg. Irreg.  with S1 S2. Soft systolic murmur , rubs, or gallops.   Abdomen:  Soft, non-tender, non-distended with normoactive bowel sounds. No hepatomegaly. No rebound/guarding. No obvious abdominal masses     MSK: Strength and the appear normal for age. , s/p left TKA.  Non tender, no swelling, no erythema,   Extremities: No clubbing or cyanosis. No edema.  Distal pedal pulses are 2+ and equal bilaterally .  Neurologic: Alert and oriented X 3. Moves all extremities spontaneously   Psych:  normal     Lab results: Basic Metabolic Panel:  Recent Labs Lab 01/21/15 1459 01/21/15 1540 01/26/15 1105  NA 137  --  137  K 3.6  --  3.9  CL 100  --  103  CO2 24  --  23  GLUCOSE 106*  --  161*  BUN 16  --  15  CREATININE 0.81  --  0.96  CALCIUM 9.6  --  10.2  MG  --  2.0 1.8    Liver Function Tests: No results for input(s): AST, ALT, ALKPHOS, BILITOT, PROT, ALBUMIN in the last 168 hours. No results for input(s): LIPASE, AMYLASE in the last 168 hours.  CBC:  Recent Labs Lab 01/21/15 1459 01/26/15 1105  WBC 10.1 15.5*  HGB 16.7 17.7*  HCT 47.2 48.8  MCV 88.1 89.5  PLT 232 269    Cardiac Enzymes:  Recent Labs Lab 01/26/15 1105  TROPONINI <0.03    BNP: Invalid input(s): POCBNP  CBG: No results for input(s): GLUCAP in the last 168 hours.  Coagulation Studies: No results for input(s): LABPROT, INR in the last 72 hours.   Other results: EKG - Atrial fibrillation:  Rate 160. No St or T wave changes.   Imaging: Dg Chest Port 1 View  01/26/2015   CLINICAL DATA:  64 year old male with dyspnea  EXAM: PORTABLE CHEST - 1 VIEW  COMPARISON:  Prior chest x-ray 01/21/2015  FINDINGS: The lungs are clear and negative for focal airspace consolidation, pulmonary edema or suspicious pulmonary nodule. No pleural effusion or pneumothorax. Cardiac and mediastinal contours are within normal limits. No acute fracture or lytic or blastic osseous lesions. The visualized upper abdominal bowel gas pattern is unremarkable.  IMPRESSION: No active cardiopulmonary disease.   Electronically Signed   By: Jacqulynn Cadet M.D.   On: 01/26/2015 11:49   CXR was personally reviewed.     Echo  - normal  LV function.    Assessment & Plan:  1. Paroxysmal atrial fibrillation - he returns with recurrent PAF.  He feels very short of breath and fatigued.   It's possible that the atrial fibrillation is causing his symptoms but a wonderful he has another mechanism going on. His white blood cell count is 15,000. His white blood cell count last week was 10,000.  We'll continue with the Cardizem drip. There is a good chance that he will convert to sinus rhythm. I will go and start him on Eliquis in anticipation of doing a cardioversion at a later time. We'll also  start him on low-dose metoprolol  2. Hyperlipidemia - continue current medications.  3. Mild aortic stenosis - stable.  4. History of DVT  5. Elevated WBC- his white blood cell count is mildly elevated today. We will have him follow-up with his general medical doctor. We'll recheck it tomorrow.  DVT PPX -    Ramond Dial., MD, [facility] 01/26/2015, 12:36 PM

## 2015-01-26 NOTE — Telephone Encounter (Signed)
Pt states that he is having weakness, no energy, SOB, some dizziness. But no chest pain or Wheezing. I asked if he has taken his albuterol inhaler for his SOB and he said no so he did 2 inhalers while on the phone. But he states that he is going to his CPA then coming straight to you for you to see him and do an EKG. I advised him that you had a 3:00pm opening but he said he can't wait that long and if he has to wait he will sit and wait for you to work him in. PLEASE CALL him and advise him what to do?

## 2015-01-26 NOTE — ED Notes (Signed)
Pt states he woke up this morning feeling fatigued and weak, states some sob and also left sided pressure both of which have subsided currently. Pt recently seen for same and found to be in a-fib. nad noted at this time.

## 2015-01-26 NOTE — ED Provider Notes (Signed)
CSN: [id number]     Arrival date & time 01/26/15  1052 History   First MD Initiated Contact with Patient 01/26/15 1053     No chief complaint on file.    (Consider location/radiation/quality/duration/timing/severity/associated sxs/prior Treatment) HPI patient presents with new dyspnea, fatigue, headache. Symptoms began earlier today, about 3 hours ago. Since onset symptoms of been persistent, causing discomfort, without focal chest pain.  Minimal relief with rest, carotid massage.  Patient was evaluated by me one week ago after a similar constellation of symptoms. At that point, the patient was in atrial fibrillation with rapid ventricular response, but spontaneously converted to sinus rhythm. He notes that since discharge from the hospital 5 days ago he has been generally well, resting. No new medication, and, indeed, the patient has stopped using OTC cough medication, which was considered as a precipitant to his recent episode of atrial fibrillation.  Currently the patient denies confusion, disorientation, continues to endorse dyspnea, palpitations.   Past Medical History  Diagnosis Date  . Hyperlipidemia   . GERD (gastroesophageal reflux disease)   . BPH (benign prostatic hyperplasia)   . Gout   . Obesity   . PAF (paroxysmal atrial fibrillation)     a. CHADsVASc score 0. no longterm AC  . Aortic stenosis, mild   . Kidney stones   . DVT (deep venous thrombosis)   . Depression    Past Surgical History  Procedure Laterality Date  . Colonoscopy  2010    [facility]  . Total knee arthroplasty Left 2001    [facility]  . Tonsillectomy    . Joint replacement    . Knee arthroscopy Bilateral "multiple times"  . Cystoscopy w/ stone manipulation  1980's    "couldn't get stone so they had to cut me open"  . Kidney stone surgery  1980's    "stone lodged in the duct; had to cut me open to get it"  . Fracture surgery    . Forearm fracture surgery Right ~ 1961  . Cardiac catheterization   1990's    "Dr. Melvern Banker"   No family history on file. History  Substance Use Topics  . Smoking status: Never Smoker   . Smokeless tobacco: Never Used  . Alcohol Use: Yes     Comment: 01/21/2015 "maybe 4-5 drinks/yr"    Review of Systems  Constitutional:       Per HPI, otherwise negative  HENT:       Per HPI, otherwise negative  Respiratory:       Per HPI, otherwise negative  Cardiovascular:       Per HPI, otherwise negative  Gastrointestinal: Negative for vomiting.  Endocrine:       Negative aside from HPI  Genitourinary:       Neg aside from HPI   Musculoskeletal:       Per HPI, otherwise negative  Skin: Negative.   Neurological: Negative for syncope.      Allergies  Codeine and Oxycodone  Home Medications   Prior to Admission medications   Medication Sig Start Date End Date Taking? Authorizing Provider  albuterol (PROVENTIL HFA;VENTOLIN HFA) 108 (90 BASE) MCG/ACT inhaler Inhale 2 puffs into the lungs every 6 (six) hours as needed for wheezing. 12/05/11 01/21/15  Denita Lung, MD  allopurinol (ZYLOPRIM) 300 MG tablet Take 300 mg by mouth daily.      Historical Provider, MD  ASMANEX 30 METERED DOSES 220 MCG/INH inhaler INHALE TWO PUFFS BY MOUTH TWICE DAILY    Denita Lung,  MD  aspirin 81 MG tablet Take 81 mg by mouth daily.      Historical Provider, MD  atorvastatin (LIPITOR) 20 MG tablet Take 1 tablet (20 mg total) by mouth daily at 6 PM. 09/23/14   Denita Lung, MD  buPROPion (WELLBUTRIN XL) 300 MG 24 hr tablet Take 1 tablet (300 mg total) by mouth daily. 09/23/14   Denita Lung, MD  fish oil-omega-3 fatty acids 1000 MG capsule Take 1 g by mouth daily.     Historical Provider, MD  Multiple Vitamins-Minerals (MULTIVITAMIN WITH MINERALS) tablet Take 1 tablet by mouth daily.      Historical Provider, MD  olopatadine (PATANOL) 0.1 % ophthalmic solution Place 1 drop into both eyes daily as needed for allergies.    Historical Provider, MD  omeprazole (PRILOSEC) 20 MG  capsule Take 20 mg by mouth daily.    Historical Provider, MD  silodosin (RAPAFLO) 4 MG CAPS capsule Take 4 mg by mouth every evening.     Historical Provider, MD  tadalafil (CIALIS) 5 MG tablet Take 5 mg by mouth daily as needed for erectile dysfunction.     Historical Provider, MD  zolpidem (AMBIEN) 10 MG tablet TAKE 1 TABLET BY MOUTH EVERY DAY AT BEDTIME AS NEEDED FOR SLEEP Patient taking differently: Take 10 mg by mouth See admin instructions. Patient states he breaks tablet into 3rds 12/03/14   Denita Lung, MD   There were no vitals taken for this visit. Physical Exam  Constitutional: He is oriented to person, place, and time. He appears well-developed. No distress.  HENT:  Head: Normocephalic and atraumatic.  Eyes: Conjunctivae and EOM are normal.  Cardiovascular: An irregularly irregular rhythm present. Tachycardia present.   Pulmonary/Chest: Effort normal. No stridor. No respiratory distress.  Abdominal: He exhibits no distension.  Musculoskeletal: He exhibits no edema.  Neurological: He is alert and oriented to person, place, and time.  Skin: Skin is warm and dry.  Psychiatric: He has a normal mood and affect.  Nursing note and vitals reviewed.   ED Course  Procedures (including critical care time) Labs Review Labs Reviewed  CBC - Abnormal; Notable for the following:    WBC 15.5 (*)    Hemoglobin 17.7 (*)    MCHC 36.3 (*)    All other components within normal limits  BRAIN NATRIURETIC PEPTIDE - Abnormal; Notable for the following:    B Natriuretic Peptide 121.4 (*)    All other components within normal limits  BASIC METABOLIC PANEL - Abnormal; Notable for the following:    Glucose, Bld 161 (*)    GFR calc non Af Amer 86 (*)    All other components within normal limits  TROPONIN I  MAGNESIUM  TSH    Imaging Review Dg Chest Port 1 View  01/26/2015   CLINICAL DATA:  64 year old male with dyspnea  EXAM: PORTABLE CHEST - 1 VIEW  COMPARISON:  Prior chest x-ray  01/21/2015  FINDINGS: The lungs are clear and negative for focal airspace consolidation, pulmonary edema or suspicious pulmonary nodule. No pleural effusion or pneumothorax. Cardiac and mediastinal contours are within normal limits. No acute fracture or lytic or blastic osseous lesions. The visualized upper abdominal bowel gas pattern is unremarkable.  IMPRESSION: No active cardiopulmonary disease.   Electronically Signed   By: Jacqulynn Cadet M.D.   On: 01/26/2015 11:49     EKG Interpretation   Date/Time:  Monday January 26 2015 11:01:53 EDT Ventricular Rate:  128 PR Interval:  QRS Duration: 93 QT Interval:  320 QTC Calculation: 467 R Axis:   51 Text Interpretation:  Atrial fibrillation Ventricular premature complex  Borderline T wave abnormalities Atrial fibrillation Premature ventricular  complexes Abnormal ekg Confirmed by Carmin Muskrat  MD [phone]) on  01/26/2015 11:06:51 AM     I reviewed the patient's chart, including record of discharge last week. Additionally, the patient had echocardiogram more than one year ago, unremarkable.   After the initial evaluation patient received IV fluids, Cardizem bolus, drip.   Update: I discussed patient's case with cardiology.  12:36 PM HR 90's- 100 - on drip.    MDM   Final diagnoses:  Dyspnea   atrial fibrillation with rapid ventricular response.  Patient presents with palpitations, fatigue, lightheadedness, some be in atrial fibrillation, rapid ventricular response. Notably, the patient had an episode similarly, one week ago, was well in the interval. Today the patient had persistent tachycardia, requiring initiation of Cardizem drip. Patient tolerated this well, with decrease in his heart rate. Patient's care was coordinated with cardiology, and the patient was admitted for further evaluation, management.  CRITICAL CARE Performed by: Carmin Muskrat Total critical care time: 35 Critical care time was exclusive of  separately billable procedures and treating other patients. Critical care was necessary to treat or prevent imminent or life-threatening deterioration. Critical care was time spent personally by me on the following activities: development of treatment plan with patient and/or surrogate as well as nursing, discussions with consultants, evaluation of patient's response to treatment, examination of patient, obtaining history from patient or surrogate, ordering and performing treatments and interventions, ordering and review of laboratory studies, ordering and review of radiographic studies, pulse oximetry and re-evaluation of patient's condition.     Carmin Muskrat, MD 01/26/15 [phone]

## 2015-01-26 NOTE — Progress Notes (Signed)
   Subjective:    Patient ID: Jeremy Tucker, male    DOB: 1951-02-03, 64 y.o.   MRN: [id number]  HPI He is here for  An acute visit. He did have difficulty with atrial fibrillation last week. This was mainly due to a viral URI and over use of decongestants. He spontaneously converted while in the hospital. Today he essentially developed the same symptoms of weakness and some slight shortness of breath and extreme fatigue.   Review of Systems     Objective:   Physical Exam Alert and weak appearing. Cardiac exam shows an irregular rapid rhythm. EKG did show atrial fibrillation with a rapid response. Lungs are clear to auscultation. Pulse ox was normal.       Assessment & Plan:  Atrial fibrillation, unspecified - Plan: EKG 12-Lead An attempt was made to do carotid massage without benefit. Dr. Daneen Schick was consulted. He will be sent to [facility] [place]. He was monitored here for approximately half an hour before leaving for the emergency room.

## 2015-01-26 NOTE — Plan of Care (Signed)
Problem: Phase II Progression Outcomes Goal: Anginal pain relieved Outcome: Progressing Patient has remained free of anginal pain throughout shift. Complains of headache, PRN Naproxen given. Will continue to monitor.

## 2015-01-26 NOTE — Progress Notes (Signed)
Pt requested SCDs be removed. Is ambulating to BR on own power. Also, requested Ambien for sleep, called Dr and had ordered

## 2015-01-26 NOTE — ED Notes (Signed)
Brought pt back to room via wheelchair; pt undressed, in gown, on monitor, continuous pulse oximetry and blood pressure cuff

## 2015-01-27 DIAGNOSIS — Z7901 Long term (current) use of anticoagulants: Secondary | ICD-10-CM | POA: Diagnosis not present

## 2015-01-27 DIAGNOSIS — N400 Enlarged prostate without lower urinary tract symptoms: Secondary | ICD-10-CM | POA: Diagnosis present

## 2015-01-27 DIAGNOSIS — I519 Heart disease, unspecified: Secondary | ICD-10-CM

## 2015-01-27 DIAGNOSIS — R0600 Dyspnea, unspecified: Secondary | ICD-10-CM | POA: Diagnosis present

## 2015-01-27 DIAGNOSIS — J45909 Unspecified asthma, uncomplicated: Secondary | ICD-10-CM | POA: Diagnosis present

## 2015-01-27 DIAGNOSIS — K219 Gastro-esophageal reflux disease without esophagitis: Secondary | ICD-10-CM | POA: Diagnosis present

## 2015-01-27 DIAGNOSIS — M109 Gout, unspecified: Secondary | ICD-10-CM | POA: Diagnosis present

## 2015-01-27 DIAGNOSIS — R7302 Impaired glucose tolerance (oral): Secondary | ICD-10-CM | POA: Diagnosis present

## 2015-01-27 DIAGNOSIS — I5033 Acute on chronic diastolic (congestive) heart failure: Secondary | ICD-10-CM | POA: Diagnosis present

## 2015-01-27 DIAGNOSIS — Z86718 Personal history of other venous thrombosis and embolism: Secondary | ICD-10-CM | POA: Diagnosis not present

## 2015-01-27 DIAGNOSIS — I350 Nonrheumatic aortic (valve) stenosis: Secondary | ICD-10-CM

## 2015-01-27 DIAGNOSIS — E785 Hyperlipidemia, unspecified: Secondary | ICD-10-CM | POA: Diagnosis present

## 2015-01-27 DIAGNOSIS — E669 Obesity, unspecified: Secondary | ICD-10-CM | POA: Diagnosis present

## 2015-01-27 DIAGNOSIS — Z79899 Other long term (current) drug therapy: Secondary | ICD-10-CM | POA: Diagnosis not present

## 2015-01-27 DIAGNOSIS — I480 Paroxysmal atrial fibrillation: Secondary | ICD-10-CM | POA: Diagnosis present

## 2015-01-27 DIAGNOSIS — I4891 Unspecified atrial fibrillation: Secondary | ICD-10-CM

## 2015-01-27 DIAGNOSIS — Z96652 Presence of left artificial knee joint: Secondary | ICD-10-CM | POA: Diagnosis not present

## 2015-01-27 DIAGNOSIS — Z7982 Long term (current) use of aspirin: Secondary | ICD-10-CM | POA: Diagnosis not present

## 2015-01-27 DIAGNOSIS — J4520 Mild intermittent asthma, uncomplicated: Secondary | ICD-10-CM

## 2015-01-27 LAB — CBC
HCT: 42.4 % (ref 39.0–52.0)
Hemoglobin: 14.3 g/dL (ref 13.0–17.0)
MCH: 31.2 pg (ref 26.0–34.0)
MCHC: 33.7 g/dL (ref 30.0–36.0)
MCV: 92.6 fL (ref 78.0–100.0)
Platelets: 220 10*3/uL (ref 150–400)
RBC: 4.58 MIL/uL (ref 4.22–5.81)
RDW: 12.9 % (ref 11.5–15.5)
WBC: 9.6 10*3/uL (ref 4.0–10.5)

## 2015-01-27 LAB — BASIC METABOLIC PANEL
Anion gap: 6 (ref 5–15)
BUN: 16 mg/dL (ref 6–23)
CO2: 27 mmol/L (ref 19–32)
Calcium: 8.9 mg/dL (ref 8.4–10.5)
Chloride: 109 mmol/L (ref 96–112)
Creatinine, Ser: 0.83 mg/dL (ref 0.50–1.35)
GFR calc Af Amer: >90 mL/min (ref >90)
GFR calc non Af Amer: >90 mL/min (ref >90)
Glucose, Bld: 125 mg/dL — ABNORMAL HIGH (ref 70–99)
Potassium: 3.9 mmol/L (ref 3.5–5.1)
Sodium: 142 mmol/L (ref 135–145)

## 2015-01-27 MED ORDER — SOTALOL HCL 120 MG PO TABS: 120.0000 mg | ORAL_TABLET | Freq: Two times a day (BID) | ORAL | Status: DC

## 2015-01-27 MED ORDER — FLUTICASONE PROPIONATE HFA 44 MCG/ACT IN AERO: 2.0000 | INHALATION_SPRAY | Freq: Two times a day (BID) | RESPIRATORY_TRACT | Status: DC | PRN

## 2015-01-27 MED ORDER — OLOPATADINE HCL 0.1 % OP SOLN: 1.0000 [drp] | Freq: Two times a day (BID) | OPHTHALMIC | Status: DC | PRN

## 2015-01-27 MED ORDER — OFF THE BEAT BOOK
Freq: Once | Status: AC
Start: 2015-01-27 — End: 2015-01-27
  Administered 2015-01-27: 12:00:00
  Filled 2015-01-27: qty 1

## 2015-01-27 NOTE — Discharge Instructions (Signed)

## 2015-01-27 NOTE — Progress Notes (Signed)
UR completed 

## 2015-01-27 NOTE — Progress Notes (Signed)
    TEE/DCCV scheduled for tomorrow 01/28/15 at 10am with Dr. Sallyanne Kuster. NPO after midnight.   Angelena Form PA-C  [facility]

## 2015-01-27 NOTE — Progress Notes (Signed)
Decreased Cardizem to 5mg /hr. BP at 103/38, HR at 76. Was at 8mg /hr

## 2015-01-27 NOTE — Progress Notes (Signed)
Spoke with Dr Aundra Dubin. HR dropping to low 60's, occasional dips to 58. Last BP was 95/54 and still in Afib. New parameters given of SBP<90 and HR<50. Will continue to monitor

## 2015-01-27 NOTE — Progress Notes (Signed)
Pt Refused Flovent and Patanolol. Said he only takes them PRN

## 2015-01-27 NOTE — Care Management Note (Signed)
    Page 1 of 1   01/27/2015     11:50:59 AM CARE MANAGEMENT NOTE 01/27/2015  Patient:  Jeremy Tucker, Jeremy Tucker   Account Number:  [id number]  Date Initiated:  01/27/2015  Documentation initiated by:  GRAVES-BIGELOW,BRENDA  Subjective/Objective Assessment:   Pt admitted for  Afib with RVR. Initiated on cardizem gtt.     Action/Plan:   Referral receieved for Eliquis- [facility] has medication available. 30 day free/co pay card given to pt. Pt will need Rx for 30 day free. No further needs from CM at this time.   Anticipated DC Date:  01/28/2015   Anticipated DC Plan:  HOME/SELF CARE      DC Planning Services  CM consult  Medication Assistance      Choice offered to / List presented to:             Status of service:  Completed, signed off Medicare Important Message given?  NO (If response is "NO", the following Medicare IM given date fields will be blank) Date Medicare IM given:   Medicare IM given by:   Date Additional Medicare IM given:   Additional Medicare IM given by:    Discharge Disposition:  HOME/SELF CARE  Per UR Regulation:  Reviewed for med. necessity/level of care/duration of stay  If discussed at Long Length of Stay Meetings, dates discussed:    Comments:  PER REP AT [facility]: $20 co-pay/ NO AUTH REQUIRED  90 DAY RETAIL $60.00/ NO AUTH REQUIRED  PATIENT CAN USE ANY RETAIL PHARMACY

## 2015-01-27 NOTE — Progress Notes (Addendum)
       Patient Name: Jeremy Tucker Date of Encounter: 01/27/2015    SUBJECTIVE:No sleep. Feels poorly. Has less dyspnea with activity.  TELEMETRY:  A fib with controlled rate Filed Vitals:   01/27/15 0445 01/27/15 0525 01/27/15 0712 01/27/15 1000  BP: 104/62 96/54 130/88 152/96  Pulse: 82 67 88 80  Temp:   97.5 F (36.4 C)   TempSrc:   Axillary   Resp: 20 17 20 23   Weight:      SpO2: 94% 95% 96% 95%    Intake/Output Summary (Last 24 hours) at 01/27/15 1126 Last data filed at 01/27/15 0500  Gross per 24 hour  Intake    120 ml  Output      0 ml  Net    120 ml   LABS: Basic Metabolic Panel:  Recent Labs  01/26/15 1105 01/27/15 0510  NA 137 142  K 3.9 3.9  CL 103 109  CO2 23 27  GLUCOSE 161* 125*  BUN 15 16  CREATININE 0.96 0.83  CALCIUM 10.2 8.9  MG 1.8  --    CBC:  Recent Labs  01/26/15 1105 01/27/15 0510  WBC 15.5* 9.6  HGB 17.7* 14.3  HCT 48.8 42.4  MCV 89.5 92.6  PLT 269 220   Cardiac Enzymes:  Recent Labs  01/26/15 1105  TROPONINI <0.03     Radiology/Studies:  No active cardiac disease on CXR  Physical Exam: Blood pressure 152/96, pulse 80, temperature 97.5 F (36.4 C), temperature source Axillary, resp. rate 23, weight 235 lb (106.595 kg), SpO2 95 %. Weight change:   Wt Readings from Last 3 Encounters:  01/27/15 235 lb (106.595 kg)  01/21/15 230 lb (104.327 kg)  01/21/15 230 lb (104.327 kg)    Faint wheezes but no rales Cardiac with 2/6 systolic murmur   ASSESSMENT:  1. Paroxysmal atrial fibrillation, with two recent episodes. Last week he spontaneously converted. Started on low dose sotalol last PM. Lysle Rubens wheezing this AM. CHADS-VASC 0-1 2. Acute diastolic heart failure. History or chronic diastolic dysfunction. 3. Asthma 4. Mild aortic stenosis  Plan:  1. IV diltiazem for rate control 2. Discontinue Sotalol 3. Consider Flecainide 4 TEE Cardioversion in AM 5. NPO after MN 6. Will need OP sleep study  Demetrios Isaacs 01/27/2015, 11:26 AM

## 2015-01-28 ENCOUNTER — Encounter (HOSPITAL_COMMUNITY): Payer: Self-pay | Admitting: Physician Assistant

## 2015-01-28 ENCOUNTER — Other Ambulatory Visit: Payer: Self-pay | Admitting: Physician Assistant

## 2015-01-28 ENCOUNTER — Telehealth: Payer: Self-pay | Admitting: Interventional Cardiology

## 2015-01-28 ENCOUNTER — Encounter (HOSPITAL_COMMUNITY)
Admission: EM | Disposition: A | Discharge status: Home / Self Care | Payer: Self-pay | Source: Home / Self Care | Attending: Interventional Cardiology

## 2015-01-28 DIAGNOSIS — M109 Gout, unspecified: Secondary | ICD-10-CM | POA: Diagnosis present

## 2015-01-28 DIAGNOSIS — K219 Gastro-esophageal reflux disease without esophagitis: Secondary | ICD-10-CM | POA: Diagnosis present

## 2015-01-28 DIAGNOSIS — N400 Enlarged prostate without lower urinary tract symptoms: Secondary | ICD-10-CM | POA: Diagnosis present

## 2015-01-28 DIAGNOSIS — Z5181 Encounter for therapeutic drug level monitoring: Secondary | ICD-10-CM

## 2015-01-28 DIAGNOSIS — Z79899 Other long term (current) drug therapy: Principal | ICD-10-CM

## 2015-01-28 SURGERY — ECHOCARDIOGRAM, TRANSESOPHAGEAL
Anesthesia: Monitor Anesthesia Care

## 2015-01-28 MED ORDER — APIXABAN 5 MG PO TABS: 5.0000 mg | ORAL_TABLET | Freq: Two times a day (BID) | ORAL | Status: DC

## 2015-01-28 MED ORDER — FLECAINIDE ACETATE 50 MG PO TABS
50.0000 mg | ORAL_TABLET | Freq: Two times a day (BID) | ORAL | Status: DC
  Administered 2015-01-28: 50 mg via ORAL
  Filled 2015-01-28 (×2): qty 1

## 2015-01-28 MED ORDER — FLECAINIDE ACETATE 50 MG PO TABS: 50.0000 mg | ORAL_TABLET | Freq: Two times a day (BID) | ORAL | Status: DC

## 2015-01-28 NOTE — Discharge Summary (Signed)
Discharge Summary   Patient ID: Jeremy Tucker MRN: [id number], DOB/AGE: 1951/03/13 64 y.o. Admit date: 01/26/2015 D/C date:     01/28/2015  Primary Cardiologist: Dr. Tamala Julian   Principal Problem:   Atrial fibrillation with RVR Active Problems:   Hyperlipidemia LDL goal <70   Glucose intolerance (impaired glucose tolerance)   Obesity (BMI 30-39.9)   Asthma   Aortic stenosis, mild-Nov 6433   Diastolic dysfunction-grade 15 Sep 2014   GERD (gastroesophageal reflux disease)   BPH (benign prostatic hyperplasia)   Gout    Admission Dates: 01/26/15-01/28/15 Discharge Diagnosis: Recurrent atrial fibrillation s/p spontaneous conversion into NSR. Placed on Flecainide and Eliquis  HPI: Jeremy Tucker is a 64 y.o. male with a history of PAF, mild AS, HLD, GERD, gout, diastolic dysfunction, obesity, and recent admission for afib with RVR who presented who to [facility] on 01/26/15 with recurrent atrial fibrillation with RVR.   He was admitted overnight and discharged after he spontaneously converted back into NSR on 01/22/15. He returned to [facility] on 01/26/15 with recurrent PAF w/ RVR. He reported feeling very short of breath and fatigued.  Hospital Course  PAF- recurrent atrial fibrillation with RVR -- Started on Cardizem gtt, sotalol and Eliquis. It was planned for TEE/DCCV but he spontaneously converted to NSR. Sotalol was discontinued due to wheezing and he was started on Flecainide this AM. He was monitored after Flecainide dose and did well.  -- Recent TSH normal -- He did have a mild white count on admission (~15K) which has now resolved.  -- CHADS-VASc now is 0, but he was placed on Eliquis for possible DCCV; however, Dr Tamala Julian has recommended that he continue on this for now.  -- I have arranged for a exercise nuclear stress test as an outpatient as he has been placed on Flecainide.   Obesity -- Weight loss discussed as improving PAF  Mild AS by ECHO -- Clinically monitor  Chronic diastolic  CHF- no s/s volume overload.   HLD- continue statin and fish oil  GERD- continue PPI  Possible OSA- will need outpatient sleep study; this can be arranged at follow up.   Hyperglycemia- noted on CBGs. HgA1c pending at discharge. Can be followed as an outpatient. If he qualifies as a diabetic he will need to be referred to his PCP for diabetes management.   The patient has had an uncomplicated hospital course and is recovering well. He has been seen by Dr. Tamala Julian today and deemed ready for discharge home. All follow-up appointments have been scheduled. Discharge medications are listed below.   Discharge Vitals: Blood pressure 152/72, pulse 78, temperature 99 F (37.2 C), temperature source Oral, resp. rate 14, weight 232 lb (105.235 kg), SpO2 96 %.  Labs: Lab Results  Component Value Date   WBC 9.6 01/27/2015   HGB 14.3 01/27/2015   HCT 42.4 01/27/2015   MCV 92.6 01/27/2015   PLT 220 01/27/2015     Recent Labs Lab 01/27/15 0510  NA 142  K 3.9  CL 109  CO2 27  BUN 16  CREATININE 0.83  CALCIUM 8.9  GLUCOSE 125*    Recent Labs  01/26/15 1105  TROPONINI <0.03   Lab Results  Component Value Date   CHOL 153 09/23/2014   HDL 43 09/23/2014   LDLCALC 63 09/23/2014   TRIG 234* 09/23/2014     Diagnostic Studies/Procedures   Dg Chest 2 View  01/21/2015   CLINICAL DATA:  Shortness of breath with near syncopal episode  showering this morning. History of atrial fibrillation. Initial encounter.  EXAM: CHEST  2 VIEW  COMPARISON:  01/30/2006 radiographs.  FINDINGS: The heart size and mediastinal contours are normal. The lungs are clear. There is no pleural effusion or pneumothorax. No acute osseous findings are identified.  IMPRESSION: Stable examination.  No active cardiopulmonary process.   Electronically Signed   By: Richardean Sale M.D.   On: 01/21/2015 15:32   Dg Chest Port 1 View  01/26/2015   CLINICAL DATA:  64 year old male with dyspnea  EXAM: PORTABLE CHEST - 1 VIEW   COMPARISON:  Prior chest x-ray 01/21/2015  FINDINGS: The lungs are clear and negative for focal airspace consolidation, pulmonary edema or suspicious pulmonary nodule. No pleural effusion or pneumothorax. Cardiac and mediastinal contours are within normal limits. No acute fracture or lytic or blastic osseous lesions. The visualized upper abdominal bowel gas pattern is unremarkable.  IMPRESSION: No active cardiopulmonary disease.   Electronically Signed   By: Jacqulynn Cadet M.D.   On: 01/26/2015 11:49    Discharge Medications     Medication List    TAKE these medications        albuterol 108 (90 BASE) MCG/ACT inhaler  Commonly known as:  PROVENTIL HFA;VENTOLIN HFA  Inhale 2 puffs into the lungs every 6 (six) hours as needed for wheezing.     allopurinol 300 MG tablet  Commonly known as:  ZYLOPRIM  Take 300 mg by mouth daily.     apixaban 5 MG Tabs tablet  Commonly known as:  ELIQUIS  Take 1 tablet (5 mg total) by mouth 2 (two) times daily.     ASMANEX 30 METERED DOSES 220 MCG/INH inhaler  Generic drug:  mometasone  INHALE TWO PUFFS BY MOUTH TWICE DAILY     aspirin 81 MG tablet  Take 81 mg by mouth daily.     atorvastatin 20 MG tablet  Commonly known as:  LIPITOR  Take 1 tablet (20 mg total) by mouth daily at 6 PM.     buPROPion 300 MG 24 hr tablet  Commonly known as:  WELLBUTRIN XL  Take 1 tablet (300 mg total) by mouth daily.     EFFER-K 20 MEQ Tbef  Generic drug:  Potassium Bicarb-Citric Acid  Take 20 mEq by mouth daily.     fish oil-omega-3 fatty acids 1000 MG capsule  Take 1 g by mouth daily.     flecainide 50 MG tablet  Commonly known as:  TAMBOCOR  Take 1 tablet (50 mg total) by mouth every 12 (twelve) hours.     multivitamin with minerals tablet  Take 1 tablet by mouth daily.     olopatadine 0.1 % ophthalmic solution  Commonly known as:  PATANOL  Place 1 drop into both eyes daily as needed for allergies.     omeprazole 20 MG capsule  Commonly known as:   PRILOSEC  Take 20 mg by mouth daily.     silodosin 4 MG Caps capsule  Commonly known as:  RAPAFLO  Take 4 mg by mouth every evening.     tadalafil 5 MG tablet  Commonly known as:  CIALIS  Take 5 mg by mouth daily as needed for erectile dysfunction.     zolpidem 10 MG tablet  Commonly known as:  AMBIEN  TAKE 1 TABLET BY MOUTH EVERY DAY AT BEDTIME AS NEEDED FOR SLEEP        Disposition   The patient will be discharged in stable condition to home.  Follow-up Information    Follow up with Truitt Merle, NP On 02/02/2015.   Specialty:  Nurse Practitioner   Why:  @ 8:30am    Contact information:   [place] [place] [address] [phone]       Follow up with [facility] On 02/04/2015.   Why:  @ 12:00 pm for your exercise nuclear stress test. Please nothing to eat before midnight the night beore.    Contact information:   [place] [address] [phone]        Duration of Discharge Encounter: Greater than 30 minutes including physician and PA time.  Mable Fill R PA-C 01/28/2015, 2:17 PM

## 2015-01-28 NOTE — Telephone Encounter (Signed)
Spoke w/pharmacist who states there is an interaction with him being on Flecainide and Welbutrin.  Spoke w/Sally Earle, pharmacist who states EKG from yesterday was good and can take the flecainide and welbutrin.  Notified pharmacist to refill.

## 2015-01-28 NOTE — Progress Notes (Signed)
Pt is ready for DC home accompanied by wife. Pt reports he understands all DC medications, follow up appointments, and instructions.   Prescilla Sours, Therapist, sports

## 2015-01-28 NOTE — Telephone Encounter (Signed)
New message      I do not know who this pt belongs to. Pharmacy calling with a drug interaction,  He was prescribed flecainide in the hosp and he is on welbutrin.

## 2015-01-28 NOTE — Progress Notes (Addendum)
He is in NSR. He denies asthma as a clinical problem. Will start Flecainide 50 mg BID. Continue Eliquis. Home later today if no problem. OP Stress Myoview soon as OP on flecainide. Keep scheduled OP appointment.

## 2015-01-29 LAB — HEMOGLOBIN A1C
Hgb A1c MFr Bld: 5.8 % — ABNORMAL HIGH (ref 4.8–5.6)
Mean Plasma Glucose: 120 mg/dL

## 2015-01-30 ENCOUNTER — Other Ambulatory Visit: Payer: Self-pay | Admitting: Family Medicine

## 2015-02-02 ENCOUNTER — Encounter: Payer: Self-pay | Admitting: Nurse Practitioner

## 2015-02-02 ENCOUNTER — Ambulatory Visit (INDEPENDENT_AMBULATORY_CARE_PROVIDER_SITE_OTHER): Payer: BLUE CROSS/BLUE SHIELD | Admitting: Nurse Practitioner

## 2015-02-02 VITALS — BP 172/94 | HR 74 | Ht 72.0 in | Wt 237.0 lb

## 2015-02-02 DIAGNOSIS — Z79899 Other long term (current) drug therapy: Secondary | ICD-10-CM

## 2015-02-02 DIAGNOSIS — I480 Paroxysmal atrial fibrillation: Secondary | ICD-10-CM

## 2015-02-02 NOTE — Progress Notes (Signed)
CARDIOLOGY OFFICE NOTE  Date:  02/02/2015    Jeremy Tucker Date of Birth: 22-Jun-1951 Medical Record #[id number]  PCP:  [name], MD  Cardiologist:  Tamala Julian    Chief Complaint  Patient presents with  . Atrial Fibrillation    Post hospital visit - seen for Dr. Tamala Julian     History of Present Illness: Jeremy Tucker is a 64 y.o. male who presents today for a post hospital visit - seen for Dr. Tamala Julian. with a history of PAF, mild AS, HLD, GERD, gout, diastolic dysfunction, obesity, and recent admission for afib with RVR who presented who to [facility] on 01/26/15 with recurrent atrial fibrillation with RVR.  He was admitted overnight and discharged after he spontaneously converted back into NSR on 01/22/15. He returned to [facility] on 01/26/15 with recurrent PAF w/ RVR. He reported feeling very short of breath and fatigued.He was placed on Sotalol - but was discontinued due to wheezing - started on Flecainide. CHADSVASC is 0 but he was placed on Eliquis for possible DCCV - has been recommended to continue for now by [facility]. Needs outpatient sleep study.   Comes back today. Here alone. Lots of headaches. BP is high. Rhythm is ok. Notices in the afternoon that he is tired and "draggy". Taking a nap in the afternoon. For Myoview later this week. He denies snoring - says that resolved with losing almost 30 pounds a year ago. More active in the warmer weather. No chest pain. Not short of breath. Probably gets too much salt - likes to eat out several times a week.   Past Medical History  Diagnosis Date  . Hyperlipidemia   . GERD (gastroesophageal reflux disease)   . BPH (benign prostatic hyperplasia)   . Gout   . Obesity   . PAF (paroxysmal atrial fibrillation)     a. CHADsVASc score 0. possible DM. Placed on Eliquis and Flecainide on 01/28/15 admission   . Aortic stenosis, mild   . Kidney stones   . DVT (deep venous thrombosis)   . Depression     Past Surgical History  Procedure  Laterality Date  . Colonoscopy  2010    MEDOFF  . Total knee arthroplasty Left 2001    MURPHY  . Tonsillectomy    . Joint replacement    . Knee arthroscopy Bilateral "multiple times"  . Cystoscopy w/ stone manipulation  1980's    "couldn't get stone so they had to cut me open"  . Kidney stone surgery  1980's    "stone lodged in the duct; had to cut me open to get it"  . Fracture surgery    . Forearm fracture surgery Right ~ 1961  . Cardiac catheterization  1990's    "Dr. Melvern Banker"     Medications: Current Outpatient Prescriptions  Medication Sig Dispense Refill  . allopurinol (ZYLOPRIM) 300 MG tablet Take 300 mg by mouth daily.      Marland Kitchen apixaban (ELIQUIS) 5 MG TABS tablet Take 1 tablet (5 mg total) by mouth 2 (two) times daily. 60 tablet 0  . ASMANEX 30 METERED DOSES 220 MCG/INH inhaler INHALE TWO PUFFS BY MOUTH TWICE DAILY (Patient taking differently: INHALE TWO PUFFS BY MOUTH TWICE DAILY as needed) 1 Inhaler 11  . aspirin 81 MG tablet Take 81 mg by mouth daily.      Marland Kitchen atorvastatin (LIPITOR) 20 MG tablet Take 1 tablet (20 mg total) by mouth daily at 6 PM. 90 tablet 0  . buPROPion [facility]  XL) 300 MG 24 hr tablet Take 1 tablet (300 mg total) by mouth daily. 90 tablet 1  . EFFER-K 20 MEQ TBEF Take 20 mEq by mouth daily.  9  . fish oil-omega-3 fatty acids 1000 MG capsule Take 1 g by mouth daily.     . flecainide (TAMBOCOR) 50 MG tablet Take 1 tablet (50 mg total) by mouth every 12 (twelve) hours. 60 tablet 11  . loratadine (CLARITIN) 10 MG tablet Take 10 mg by mouth daily.    . Multiple Vitamins-Minerals (MULTIVITAMIN WITH MINERALS) tablet Take 1 tablet by mouth daily.      Marland Kitchen olopatadine (PATANOL) 0.1 % ophthalmic solution Place 1 drop into both eyes daily as needed for allergies.    Marland Kitchen omeprazole (PRILOSEC) 20 MG capsule Take 20 mg by mouth daily.    . silodosin (RAPAFLO) 4 MG CAPS capsule Take 4 mg by mouth every evening.     . tadalafil (CIALIS) 5 MG tablet Take 5 mg by mouth daily  as needed for erectile dysfunction.     Marland Kitchen zolpidem (AMBIEN) 10 MG tablet TAKE 1 TABLET BY MOUTH EVERY DAY AT BEDTIME AS NEEDED FOR SLEEP (Patient taking differently: Take 3.34 mg by mouth at bedtime. Patient states he breaks tablet into 3rds) 90 tablet 0  . albuterol (PROVENTIL HFA;VENTOLIN HFA) 108 (90 BASE) MCG/ACT inhaler Inhale 2 puffs into the lungs every 6 (six) hours as needed for wheezing. 1 Inhaler 0   No current facility-administered medications for this visit.    Allergies: Allergies  Allergen Reactions  . Codeine Itching  . Lisinopril Other (See Comments)    dizziness  . Oxycodone Itching    Social History: The patient  reports that he has never smoked. He has never used smokeless tobacco. He reports that he drinks alcohol. He reports that he does not use illicit drugs.   Family History: The patient's family history includes Heart attack (age of onset: 6) in his father; Heart attack (age of onset: 1) in his mother.   Review of Systems: Please see the history of present illness.   Otherwise, the review of systems is positive for fatigue, depression, DOE, and headaches.   All other systems are reviewed and negative.   Physical Exam: VS:  BP 172/94 mmHg  Pulse 74  Ht 6' (1.829 m)  Wt 237 lb (107.502 kg)  BMI 32.14 kg/m2 .  BMI Body mass index is 32.14 kg/(m^2).  Wt Readings from Last 3 Encounters:  02/02/15 237 lb (107.502 kg)  01/28/15 232 lb (105.235 kg)  01/21/15 230 lb (104.327 kg)    General: Pleasant. Well developed, well nourished and in no acute distress. He is obese.  HEENT: Normal. Neck: Supple, no JVD, carotid bruits, or masses noted.  Cardiac: Regular rate and rhythm. Outflow murmur noted. No edema.  Respiratory:  Lungs are clear to auscultation bilaterally with normal work of breathing.  GI: Soft and nontender.  MS: No deformity or atrophy. Gait and ROM intact. Skin: Warm and dry. Color is normal.  Neuro:  Strength and sensation are intact and no  gross focal deficits noted.  Psych: Alert, appropriate and with normal affect.   LABORATORY DATA:  EKG:  EKG is ordered today. This demonstrates NSR.  Lab Results  Component Value Date   WBC 9.6 01/27/2015   HGB 14.3 01/27/2015   HCT 42.4 01/27/2015   PLT 220 01/27/2015   GLUCOSE 125* 01/27/2015   CHOL 153 09/23/2014   TRIG 234* 09/23/2014  HDL 43 09/23/2014   LDLCALC 63 09/23/2014   ALT 33 09/23/2014   AST 29 09/23/2014   NA 142 01/27/2015   K 3.9 01/27/2015   CL 109 01/27/2015   CREATININE 0.83 01/27/2015   BUN 16 01/27/2015   CO2 27 01/27/2015   TSH 1.309 01/26/2015   PSA 2.27 04/23/2012   HGBA1C 5.8* 01/28/2015    BNP (last 3 results)  Recent Labs  01/21/15 1459 01/26/15 1100  BNP 258.0* 121.4*    ProBNP (last 3 results) No results for input(s): PROBNP in the last 8760 hours.   Other Studies Reviewed Today:   Echo Study Conclusions from 09/2014  - Left ventricle: The cavity size was normal. Systolic function was normal. The estimated ejection fraction was in the range of 60% to 65%. Wall motion was normal; there were no regional wall motion abnormalities. Features are consistent with a pseudonormal left ventricular filling pattern, with concomitant abnormal relaxation and increased filling pressure (grade 2 diastolic dysfunction). - Aortic valve: There was mild stenosis. There was mild regurgitation. Mean gradient (S): 12 mm Hg. Peak gradient (S): 25 mm Hg. Valve area (VTI): 1.82 cm^2. Valve area (Vmax): 1.76 cm^2. Valve area (Vmean): 1.58 cm^2. - Aortic root: The aortic root was normal in size. - Left atrium: The atrium was mildly dilated. - Right ventricle: Systolic function was normal. - Right atrium: The atrium was normal in size. - Tricuspid valve: There was trivial regurgitation. - Pulmonic valve: There was no regurgitation. - Pulmonary arteries: Systolic pressure was within the normal range. - Inferior vena cava: The  vessel was normal in size. - Pericardium, extracardiac: There was no pericardial effusion.  Impressions:  - MIld aortic regurgitation and stenosis. Normal biventricular size and systolic function. Grade 2 diastolic dysfunction with elevated filling pressures.   Assessment/Plan: 1. PAF -  Maintaining NSR - will leave on his Flecainide and Eliquis. Aspirin stopped.  2. High risk medicine with Flecainide -  For stress test later this week  3. Diastolic dysfunction - needs to avoid salt  4. HTN - BP too high - he stopped his Lisinopril HCT over 6 months ago - he will resume. Monitor BP at home. Goal is less than 140/90  Current medicines are reviewed with the patient today.  The patient does not have concerns regarding medicines other than what has been noted above.  The following changes have been made:  See above.  Labs/ tests ordered today include:    Orders Placed This Encounter  Procedures  . EKG 12-Lead     Disposition:   Proceed with stress Myoview for later this week.  FU with Dr. Tamala Julian in 6 weeks  Patient is agreeable to this plan and will call if any problems develop in the interim.   Signed: Burtis Junes, RN, ANP-C 02/02/2015 9:06 AM  [facility] [address] [place], [place]  [address] Phone: [phone] Fax: [phone]

## 2015-02-02 NOTE — Patient Instructions (Addendum)
Stay on your current medicines but stop aspirin  Restart your Lisinopril/HCT  Monitor your blood pressure at home - goal is less than 140/90 - call us if not at goal  Proceed with stress test.   See Dr. Tamala Julian in about 6 weeks  Call the [place] office at [phone] if you have any questions, problems or concerns.

## 2015-02-04 ENCOUNTER — Ambulatory Visit (HOSPITAL_COMMUNITY): Payer: BLUE CROSS/BLUE SHIELD | Attending: Cardiology | Admitting: Radiology

## 2015-02-04 DIAGNOSIS — Z5181 Encounter for therapeutic drug level monitoring: Secondary | ICD-10-CM

## 2015-02-04 DIAGNOSIS — Z79899 Other long term (current) drug therapy: Secondary | ICD-10-CM | POA: Diagnosis not present

## 2015-02-04 DIAGNOSIS — I480 Paroxysmal atrial fibrillation: Secondary | ICD-10-CM | POA: Diagnosis not present

## 2015-02-04 MED ORDER — TECHNETIUM TC 99M SESTAMIBI GENERIC - CARDIOLITE
11.0000 | Freq: Once | INTRAVENOUS | Status: AC | PRN
  Administered 2015-02-04: 11 via INTRAVENOUS

## 2015-02-04 MED ORDER — TECHNETIUM TC 99M SESTAMIBI GENERIC - CARDIOLITE
33.0000 | Freq: Once | INTRAVENOUS | Status: AC | PRN
  Administered 2015-02-04: 33 via INTRAVENOUS

## 2015-02-04 NOTE — Progress Notes (Signed)
[facility] [address] [place], [place] [address] [phone]    Cardiology Nuclear Med Study  Jeremy Tucker is a 64 y.o. male     MRN : [id number]     DOB: 11-13-1951  Procedure Date: 02/04/2015  Nuclear Med Background Indication for Stress Test:  Evaluation for Ischemia and [facility]: Atrial Fib with RVR;started pm Flecainide History:  No known CAD, MPI (normal) ~1-2 yrs. ago, Asthma Cardiac Risk Factors: Atrial Fib  Symptoms:  None indicated   Nuclear Pre-Procedure Caffeine/Decaff Intake:  None NPO After: 10:00pm   Lungs:  clear O2 Sat: 94% on room air. IV 0.9% NS with Angio Cath:  22g  IV Site: R Hand  IV Started by:  Matilde Haymaker, RN  Chest Size (in):  46 Cup Size: n/a  Height: 6' (1.829 m)  Weight:  234 lb (106.142 kg)  BMI:  Body mass index is 31.73 kg/(m^2). Tech Comments:  n/a    Nuclear Med Study 1 or 2 day study: 1 day  Stress Test Type:  Stress  Reading MD: n/a  Order Authorizing Provider:  Mallie Mussel Smith,MD  Resting Radionuclide: Technetium 7m Sestamibi  Resting Radionuclide Dose: 11.0 mCi   Stress Radionuclide:  Technetium 42m Sestamibi  Stress Radionuclide Dose: 33.0 mCi           Stress Protocol Rest HR: 82 Stress HR: 137  Rest BP: 134/83 Stress BP: 169/67  Exercise Time (min): 8:30 METS: 10.1   Predicted Max HR: 156 bpm % Max HR: 87.82 bpm Rate Pressure Product: 23153   Dose of Adenosine (mg):  n/a Dose of Lexiscan: n/a mg  Dose of Atropine (mg): n/a Dose of Dobutamine: n/a mcg/kg/min (at max HR)  Stress Test Technologist: Glade Lloyd, BS-ES  Nuclear Technologist:  Earl Many, CNMT     Rest Procedure:  Myocardial perfusion imaging was performed at rest 45 minutes following the intravenous administration of Technetium 40m Sestamibi. Rest ECG: NSR with non-specific ST-T wave changes  Stress Procedure:  The patient exercised on the treadmill utilizing the Bruce Protocol for 8:30 minutes. The patient  stopped due to fatigue and denied any chest pain.  Technetium 74m Sestamibi was injected at peak exercise and myocardial perfusion imaging was performed after a brief delay. Stress ECG: No significant change from baseline ECG  QPS Raw Data Images:  Normal; no motion artifact; normal heart/lung ratio. diaphragmatic attenuation noted. Stress Images:  There is decreased radiotracer uptake seen from base to mid along the inferior wall distribution at both rest and stress, moderate to severe in severity, fixed, likely secondary to diaphragmatic attenuation versus bowel loop attenuation. Rest Images:  As above Subtraction (SDS):  No evidence of ischemia. Transient Ischemic Dilatation (Normal <1.22):  1.02 Lung/Heart Ratio (Normal <0.45):  0.25  Quantitative Gated Spect Images QGS EDV:  100 ml QGS ESV:  47 ml  Impression Exercise Capacity:  Fair exercise capacity. BP Response:  Normal blood pressure response. Clinical Symptoms:  No significant symptoms noted. ECG Impression:  No significant ST segment change suggestive of ischemia. There are no adverse arrhythmias during exercise treadmill. QRS does not widen. Comparison with Prior Nuclear Study: No images to compare  Overall Impression:  Normal stress nuclear study. There is no evidence of ischemia identified. Sensitivity and specificity of the study reduced by noted bowel loop as well as diaphragmatic attenuation.  LV Ejection Fraction: 53%.  LV Wall Motion:  NL LV Function; NL Wall Motion   No adverse  arrhythmias, no widening of QRS complex. Patient currently taking flecainide.  Candee Furbish, MD

## 2015-02-05 ENCOUNTER — Telehealth: Payer: Self-pay | Admitting: Family Medicine

## 2015-02-05 ENCOUNTER — Telehealth: Payer: Self-pay

## 2015-02-05 NOTE — Telephone Encounter (Signed)
-----   Message from Belva Crome, MD sent at 02/05/2015 11:29 AM EDT ----- The myocardial perfusion study is normal. This is great news.

## 2015-02-05 NOTE — Telephone Encounter (Signed)
Pt states got good report on echocardiogram and he asked that you call him

## 2015-02-05 NOTE — Telephone Encounter (Signed)
Pt aware of myoview results.The myocardial perfusion study is normal. This is great news.pt verbalized understanding.

## 2015-02-09 MED ORDER — BUSPIRONE HCL 5 MG PO TABS: 5.0000 mg | ORAL_TABLET | Freq: Three times a day (TID) | ORAL | Status: DC

## 2015-02-09 NOTE — Telephone Encounter (Signed)
He is doing much better but still does need some help with his anxiety. I will start him on BuSpar. He will come to see me in one or 2 months.

## 2015-02-10 ENCOUNTER — Telehealth: Payer: Self-pay | Admitting: Family Medicine

## 2015-02-10 ENCOUNTER — Encounter: Payer: Self-pay | Admitting: Family Medicine

## 2015-02-10 NOTE — Telephone Encounter (Signed)
Pt called & stated he forgot to mention to you yesterday that he was supposed to go to [place] next Wednesday & is not going to go due too his illness.  He asked if you would write a letter to the airline that he's unable to fly due to illness. [phone]

## 2015-02-10 NOTE — Telephone Encounter (Signed)
Write a letter on my letter head stating To Whom It May Concern Jeremy Tucker is under my care for new onset atrial fibrillation.he is in the process of being stabilized on a new medication regimen and I therefore recommend no flying the time being

## 2015-02-24 ENCOUNTER — Telehealth: Payer: Self-pay | Admitting: Family Medicine

## 2015-02-24 ENCOUNTER — Other Ambulatory Visit: Payer: Self-pay

## 2015-02-24 ENCOUNTER — Encounter (HOSPITAL_COMMUNITY): Payer: Self-pay | Admitting: Nurse Practitioner

## 2015-02-24 ENCOUNTER — Ambulatory Visit (HOSPITAL_COMMUNITY)
Admission: RE | Admit: 2015-02-24 | Discharge: 2015-02-24 | Disposition: A | Discharge status: Home / Self Care | Payer: BLUE CROSS/BLUE SHIELD | Source: Ambulatory Visit | Attending: Nurse Practitioner | Admitting: Nurse Practitioner

## 2015-02-24 ENCOUNTER — Telehealth: Payer: Self-pay | Admitting: Interventional Cardiology

## 2015-02-24 ENCOUNTER — Other Ambulatory Visit (HOSPITAL_COMMUNITY): Payer: Self-pay | Admitting: *Deleted

## 2015-02-24 VITALS — BP 122/78 | HR 140 | Ht 72.0 in | Wt 240.6 lb

## 2015-02-24 DIAGNOSIS — I4819 Other persistent atrial fibrillation: Secondary | ICD-10-CM

## 2015-02-24 DIAGNOSIS — I481 Persistent atrial fibrillation: Secondary | ICD-10-CM | POA: Diagnosis not present

## 2015-02-24 DIAGNOSIS — I4891 Unspecified atrial fibrillation: Secondary | ICD-10-CM | POA: Diagnosis not present

## 2015-02-24 MED ORDER — DILTIAZEM HCL ER COATED BEADS 180 MG PO CP24: 180.0000 mg | ORAL_CAPSULE | Freq: Every day | ORAL | Status: DC

## 2015-02-24 MED ORDER — LISINOPRIL-HYDROCHLOROTHIAZIDE 10-12.5 MG PO TABS: 0.5000 | ORAL_TABLET | Freq: Every day | ORAL | Status: DC

## 2015-02-24 NOTE — Progress Notes (Addendum)
.   Patient ID: Jeremy Tucker, male   DOB: [birth year], 64 y.o.   MRN: [id number]   Primary Care Physician: Jeremy Tucker, MDCardiologist: Dr. Pernell Dupre   Jeremy Tucker is a 64 y.o. male with a h/o Afib initially diagnosed in March of this year and hospitalized twice in a matter of days.He had a URI and had been taking decongestants.Started on sotalol but d/c due to wheezing,, set up for cardioversion, but converted on flecainide. Placed on eliquis 5 mg bid for chadsvasc of 1(HTN) but will turn 64 in August so essentially has a score of 2. He had been doing well but yesterday felt more fatigued, more winded and lightheaded. He started taking his BP and HR and found to be elevated. Heart rate at home 116 but in office 140, but he did walk all the way from [facility] entrance. Sitting quietly in the room, he appears to be tolerating the rate OK. Reviewed lifestyle triggers. No h/o tobacco or alcohol use. No snoring h/o, no excessive caffeine use, Tries to exercise, if not on a regular basis. Had been 30 lbs heavier in the last 1-2 years. Pending repeat echo. Had myoview recently which was low risk scan for ischemia.  Today, he denies symptoms ofchest pain,  orthopnea, PND, lower extremity edema, , presyncope, syncope, or neurologic sequela. Positive for mild dyspnea, lightheadedness, mild diaphoresis. The patient is tolerating medications without difficulties and is otherwise without complaint today.   Past Medical History  Diagnosis Date  . Hyperlipidemia   . GERD (gastroesophageal reflux disease)   . BPH (benign prostatic hyperplasia)   . Gout   . Obesity   . PAF (paroxysmal atrial fibrillation)     a. CHADsVASc score 0. possible DM. Placed on Eliquis and Flecainide on 01/28/15 admission   . Aortic stenosis, mild   . Kidney stones   . DVT (deep venous thrombosis)   . Depression    Past Surgical History  Procedure Laterality Date  . Colonoscopy  2010    MEDOFF  . Total knee  arthroplasty Left 2001    MURPHY  . Tonsillectomy    . Joint replacement    . Knee arthroscopy Bilateral "multiple times"  . Cystoscopy w/ stone manipulation  1980's    "couldn't get stone so they had to cut me open"  . Kidney stone surgery  1980's    "stone lodged in the duct; had to cut me open to get it"  . Fracture surgery    . Forearm fracture surgery Right ~ 1961  . Cardiac catheterization  1990's    "Dr. Melvern Banker"    Current Outpatient Prescriptions  Medication Sig Dispense Refill  . allopurinol (ZYLOPRIM) 300 MG tablet Take 300 mg by mouth daily.      Marland Kitchen apixaban (ELIQUIS) 5 MG TABS tablet Take 1 tablet (5 mg total) by mouth 2 (two) times daily. 60 tablet 0  . atorvastatin (LIPITOR) 20 MG tablet Take 1 tablet (20 mg total) by mouth daily at 6 PM. 90 tablet 0  . buPROPion (WELLBUTRIN XL) 300 MG 24 hr tablet Take 1 tablet (300 mg total) by mouth daily. 90 tablet 1  . busPIRone (BUSPAR) 5 MG tablet Take 1 tablet (5 mg total) by mouth 3 (three) times daily. 60 tablet 1  . fish oil-omega-3 fatty acids 1000 MG capsule Take 1 g by mouth daily.     . flecainide (TAMBOCOR) 50 MG tablet Take 1 tablet (50 mg total) by mouth every  12 (twelve) hours. 60 tablet 11  . lisinopril-hydrochlorothiazide (PRINZIDE,ZESTORETIC) 10-12.5 MG per tablet Take 0.5 tablets by mouth daily.    Marland Kitchen loratadine (CLARITIN) 10 MG tablet Take 10 mg by mouth daily.    Marland Kitchen olopatadine (PATANOL) 0.1 % ophthalmic solution Place 1 drop into both eyes daily as needed for allergies.    Marland Kitchen omeprazole (PRILOSEC) 20 MG capsule Take 20 mg by mouth daily.    Marland Kitchen zolpidem (AMBIEN) 10 MG tablet TAKE 1 TABLET BY MOUTH EVERY DAY AT BEDTIME AS NEEDED FOR SLEEP (Patient taking differently: Take 3.34 mg by mouth at bedtime. Patient states he breaks tablet into 3rds) 90 tablet 0  . albuterol (PROVENTIL HFA;VENTOLIN HFA) 108 (90 BASE) MCG/ACT inhaler Inhale 2 puffs into the lungs every 6 (six) hours as needed for wheezing. 1 Inhaler 0  . ASMANEX  30 METERED DOSES 220 MCG/INH inhaler INHALE TWO PUFFS BY MOUTH TWICE DAILY (Patient taking differently: INHALE TWO PUFFS BY MOUTH TWICE DAILY as needed) 1 Inhaler 11  . diltiazem (CARDIZEM CD) 180 MG 24 hr capsule Take 1 capsule (180 mg total) by mouth daily. 30 capsule 3  . EFFER-K 20 MEQ TBEF Take 20 mEq by mouth daily.  9  . Multiple Vitamins-Minerals (MULTIVITAMIN WITH MINERALS) tablet Take 1 tablet by mouth daily.      . silodosin (RAPAFLO) 4 MG CAPS capsule Take 4 mg by mouth every evening.     . tadalafil (CIALIS) 5 MG tablet Take 5 mg by mouth daily as needed for erectile dysfunction.      No current facility-administered medications for this encounter.    Allergies  Allergen Reactions  . Codeine Itching  . Lisinopril Other (See Comments)    dizziness  . Oxycodone Itching    History   Social History  . Marital Status: Married    Spouse Name: N/A  . Number of Children: N/A  . Years of Education: N/A   Occupational History  . Not on file.   Social History Main Topics  . Smoking status: Never Smoker   . Smokeless tobacco: Never Used  . Alcohol Use: Yes     Comment: 01/21/2015 "maybe 4-5 drinks/yr"  . Drug Use: No  . Sexual Activity: Yes   Other Topics Concern  . Not on file   Social History Narrative    Family History  Problem Relation Age of Onset  . Heart attack Mother 48    MI  . Heart attack Father 57    ? MI versus trauma to head    ROS- All systems are reviewed and negative except as per the HPI above  Physical Exam: Filed Vitals:   02/24/15 1411  BP: 122/78  Pulse: 140  Height: 6' (1.829 m)  Weight: 240 lb 9.6 oz (109.135 kg)    GEN- The patient is well appearing, alert and oriented x 3 today, but slightly anxious appearing.   Head- normocephalic, atraumatic Eyes-  Sclera clear, conjunctiva pink Ears- hearing intact Oropharynx- clear Neck- supple, no JVP Lymph- no cervical lymphadenopathy Lungs- Clear to ausculation bilaterally, normal  work of breathing Heart- Rapid, irregular rate and rhythm, no murmurs, rubs or gallops, PMI not laterally displaced GI- soft, NT, ND, + BS Extremities- no clubbing, cyanosis, or edema MS- no significant deformity or atrophy Skin- no rash or lesion Psych- euthymic mood, full affect Neuro- strength and sensation are intact  EKG- Afib with RVR, 140 bpm, QRS 45ms/QTc 470 ms Echo --11/15  Left ventricle: The cavity size was  normal. Systolic function was normal. The estimated ejection fraction was in the range of 60% to 65%. Wall motion was normal; there were no regional wall motion abnormalities. Features are consistent with a pseudonormal left ventricular filling pattern, with concomitant abnormal relaxation and increased filling pressure (grade 2 diastolic dysfunction). - Aortic valve: There was mild stenosis. There was mild regurgitation. Mean gradient (S): 12 mm Hg. Peak gradient (S): 25 mm Hg. Valve area (VTI): 1.82 cm^2. Valve area (Vmax): 1.76 cm^2. Valve area (Vmean): 1.58 cm^2. - Aortic root: The aortic root was normal in size. - Left atrium: The atrium was mildly dilated. - Right ventricle: Systolic function was normal. - Right atrium: The atrium was normal in size. - Tricuspid valve: There was trivial regurgitation. - Pulmonic valve: There was no regurgitation. - Pulmonary arteries: Systolic pressure was within the normal range. - Inferior vena cava: The vessel was normal in size. - Pericardium, extracardiac: There was no pericardial effusion.  Impressions:  - MIld aortic regurgitation and stenosis. Normal biventricular size and systolic function. Grade 2 diastolic dysfunction with elevated filling pressures.  Assessment and Plan:  1. afib with rvr  Continue flecainide 50 mg bid Flecainide level to see if room in therapeutic range to increase to 100 mg bid to decrease afib burden Add Cardizem CD 180 mg qd to control v rate and encourage return  to SR. Cut lisinopril/hct in half for now to prevent hypotension. Return tomorrow to see results of addition of Cardizem. If has not converted, consider DCCV. Has not missed any doses of epixiban. To ER if significantly short of breath, chest pain, presyncopal. Will try to move up Echo  from scheduled date in June when v. Rate is controlled.  Addendum 02/25/15  Pt returns to office after staring Cardizem last pm, in addition to flecainide 50 mg bid. Afib is now rate controlled but did not convert to SR. Still symptomatic with fatigue and shortness of breath with activity. Wishes to proceed to cardioversion. Ate last at 7:30 this am. Has taken Eliquis without missed doses since 3/10. Procedure risks vrs benefit described to pt. Will go to cardioversion at 1p today. He wishes to proceed to cardioversion

## 2015-02-24 NOTE — Telephone Encounter (Signed)
Pt c/o BP issue: STAT if pt c/o blurred vision, one-sided weakness or slurred speech  1. What are your last 5 BP readings? Today: 123/103 HR 116 Left arm HR 121/88 HR 118 Right arm   2. Are you having any other symptoms (ex. Dizziness, headache, blurred vision, passed out)? No not today, dizzy yesterday   3. What is your BP issue? BP up and down

## 2015-02-24 NOTE — Telephone Encounter (Signed)
Spoke with pt. Pt sts that he 'just does not feel good" pt sts that he is fatigues and feel wiped out. He reports his most recent bp's: He is taking all medications as prescribed, including flecainide 4/11 @7pm  114/73 113bpm, 125/88 106bpm 4/12 123/103 116bpm, 121/88 113bpm. He is wants to be seen today. He sts he will come to the office and wait to be seen. Adv pt that Dr.Smith will be out of the office after 11:15am. I will try to get him on another provider schedule today and call him back. Pt verbalized understanding.

## 2015-02-24 NOTE — Telephone Encounter (Signed)
Pt aware of appt today @ 2pm  With Ceasar Lund @ the Afib clinic

## 2015-02-24 NOTE — Patient Instructions (Signed)
Your physician has recommended you make the following change in your medication:  1)Cardizem 180mg  Daily 2) Decrease your Lisinopril/HCTZ to 1/2 tablet daily

## 2015-02-24 NOTE — Telephone Encounter (Signed)
Pt says he does not feel well. His blood pressure was 123/103 in left arm with 116 pulse and 121/88 in right arm with 113 pulse. Pt could not get an appointment with Dr Tamala Julian (his cardiologist). Does Dr Redmond School think pt should come he to be seen? Pt requesting to speak to Dr Redmond School ASAP at his work # [phone]

## 2015-02-25 ENCOUNTER — Encounter (HOSPITAL_COMMUNITY): Payer: Self-pay | Admitting: Nurse Practitioner

## 2015-02-25 ENCOUNTER — Ambulatory Visit (HOSPITAL_COMMUNITY)
Admission: RE | Admit: 2015-02-25 | Discharge: 2015-02-25 | Disposition: A | Discharge status: Home / Self Care | Payer: BLUE CROSS/BLUE SHIELD | Source: Ambulatory Visit | Attending: Cardiovascular Disease | Admitting: Cardiovascular Disease

## 2015-02-25 ENCOUNTER — Telehealth (HOSPITAL_COMMUNITY): Payer: Self-pay | Admitting: *Deleted

## 2015-02-25 ENCOUNTER — Ambulatory Visit (HOSPITAL_BASED_OUTPATIENT_CLINIC_OR_DEPARTMENT_OTHER)
Admission: RE | Admit: 2015-02-25 | Discharge: 2015-02-25 | Disposition: A | Discharge status: Home / Self Care | Payer: BLUE CROSS/BLUE SHIELD | Source: Ambulatory Visit | Attending: Nurse Practitioner | Admitting: Nurse Practitioner

## 2015-02-25 ENCOUNTER — Ambulatory Visit (HOSPITAL_COMMUNITY): Payer: BLUE CROSS/BLUE SHIELD | Admitting: Certified Registered"

## 2015-02-25 ENCOUNTER — Encounter (HOSPITAL_COMMUNITY): Payer: Self-pay | Admitting: *Deleted

## 2015-02-25 ENCOUNTER — Telehealth: Payer: Self-pay | Admitting: Interventional Cardiology

## 2015-02-25 ENCOUNTER — Encounter (HOSPITAL_COMMUNITY)
Admission: RE | Disposition: A | Discharge status: Home / Self Care | Payer: Self-pay | Source: Ambulatory Visit | Attending: Cardiovascular Disease

## 2015-02-25 ENCOUNTER — Encounter (HOSPITAL_COMMUNITY): Payer: Self-pay | Admitting: Certified Registered"

## 2015-02-25 VITALS — BP 118/80 | HR 94

## 2015-02-25 DIAGNOSIS — E669 Obesity, unspecified: Secondary | ICD-10-CM | POA: Insufficient documentation

## 2015-02-25 DIAGNOSIS — Z9861 Coronary angioplasty status: Secondary | ICD-10-CM | POA: Diagnosis not present

## 2015-02-25 DIAGNOSIS — F329 Major depressive disorder, single episode, unspecified: Secondary | ICD-10-CM | POA: Diagnosis not present

## 2015-02-25 DIAGNOSIS — Z96659 Presence of unspecified artificial knee joint: Secondary | ICD-10-CM | POA: Insufficient documentation

## 2015-02-25 DIAGNOSIS — Z7982 Long term (current) use of aspirin: Secondary | ICD-10-CM | POA: Insufficient documentation

## 2015-02-25 DIAGNOSIS — I481 Persistent atrial fibrillation: Secondary | ICD-10-CM

## 2015-02-25 DIAGNOSIS — K219 Gastro-esophageal reflux disease without esophagitis: Secondary | ICD-10-CM | POA: Diagnosis not present

## 2015-02-25 DIAGNOSIS — I4891 Unspecified atrial fibrillation: Secondary | ICD-10-CM | POA: Diagnosis present

## 2015-02-25 DIAGNOSIS — N400 Enlarged prostate without lower urinary tract symptoms: Secondary | ICD-10-CM | POA: Insufficient documentation

## 2015-02-25 DIAGNOSIS — E785 Hyperlipidemia, unspecified: Secondary | ICD-10-CM | POA: Diagnosis not present

## 2015-02-25 DIAGNOSIS — N529 Male erectile dysfunction, unspecified: Secondary | ICD-10-CM | POA: Diagnosis not present

## 2015-02-25 DIAGNOSIS — J45909 Unspecified asthma, uncomplicated: Secondary | ICD-10-CM | POA: Insufficient documentation

## 2015-02-25 DIAGNOSIS — M109 Gout, unspecified: Secondary | ICD-10-CM | POA: Insufficient documentation

## 2015-02-25 DIAGNOSIS — Z6832 Body mass index (BMI) 32.0-32.9, adult: Secondary | ICD-10-CM | POA: Insufficient documentation

## 2015-02-25 DIAGNOSIS — Z86718 Personal history of other venous thrombosis and embolism: Secondary | ICD-10-CM | POA: Insufficient documentation

## 2015-02-25 DIAGNOSIS — I350 Nonrheumatic aortic (valve) stenosis: Secondary | ICD-10-CM | POA: Diagnosis not present

## 2015-02-25 DIAGNOSIS — Z87442 Personal history of urinary calculi: Secondary | ICD-10-CM | POA: Diagnosis not present

## 2015-02-25 DIAGNOSIS — I480 Paroxysmal atrial fibrillation: Secondary | ICD-10-CM | POA: Insufficient documentation

## 2015-02-25 HISTORY — PX: CARDIOVERSION: SHX1299

## 2015-02-25 LAB — BASIC METABOLIC PANEL
Anion gap: 8 (ref 5–15)
BUN: 14 mg/dL (ref 6–23)
CO2: 28 mmol/L (ref 19–32)
Calcium: 9.4 mg/dL (ref 8.4–10.5)
Chloride: 102 mmol/L (ref 96–112)
Creatinine, Ser: 0.71 mg/dL (ref 0.50–1.35)
GFR calc Af Amer: >90 mL/min (ref >90)
GFR calc non Af Amer: >90 mL/min (ref >90)
Glucose, Bld: 120 mg/dL — ABNORMAL HIGH (ref 70–99)
Potassium: 3.9 mmol/L (ref 3.5–5.1)
Sodium: 138 mmol/L (ref 135–145)

## 2015-02-25 LAB — CBC
HCT: 44.6 % (ref 39.0–52.0)
Hemoglobin: 15.5 g/dL (ref 13.0–17.0)
MCH: 31.8 pg (ref 26.0–34.0)
MCHC: 34.8 g/dL (ref 30.0–36.0)
MCV: 91.6 fL (ref 78.0–100.0)
Platelets: 233 10*3/uL (ref 150–400)
RBC: 4.87 MIL/uL (ref 4.22–5.81)
RDW: 12.7 % (ref 11.5–15.5)
WBC: 10.1 10*3/uL (ref 4.0–10.5)

## 2015-02-25 SURGERY — CARDIOVERSION
Anesthesia: Monitor Anesthesia Care

## 2015-02-25 MED ORDER — SODIUM CHLORIDE 0.9 % IV SOLN: INTRAVENOUS | Status: DC

## 2015-02-25 MED ORDER — PROPOFOL 10 MG/ML IV BOLUS
INTRAVENOUS | Status: DC | PRN
  Administered 2015-02-25: 70 mg via INTRAVENOUS

## 2015-02-25 NOTE — Telephone Encounter (Signed)
Patient's informed of appt 4/14 @ 3pm for echo.

## 2015-02-25 NOTE — Anesthesia Preprocedure Evaluation (Addendum)
Anesthesia Evaluation  Patient identified by MRN, date of birth, ID band Patient awake    History of Anesthesia Complications (+) history of anesthetic complications  Airway Mallampati: II  TM Distance: >3 FB Neck ROM: Full    Dental   Pulmonary asthma ,  breath sounds clear to auscultation        Cardiovascular Rhythm:Regular Rate:Normal     Neuro/Psych PSYCHIATRIC DISORDERS Depression    GI/Hepatic GERD-  ,  Endo/Other    Renal/GU Renal disease     Musculoskeletal   Abdominal   Peds  Hematology   Anesthesia Other Findings   Reproductive/Obstetrics                            Anesthesia Physical Anesthesia Plan  ASA: III  Anesthesia Plan: MAC   Post-op Pain Management:    Induction: Intravenous  Airway Management Planned: Simple Face Mask  Additional Equipment:   Intra-op Plan:   Post-operative Plan:   Informed Consent: I have reviewed the patients History and Physical, chart, labs and discussed the procedure including the risks, benefits and alternatives for the proposed anesthesia with the patient or authorized representative who has indicated his/her understanding and acceptance.   Dental advisory given  Plan Discussed with: CRNA and Anesthesiologist  Anesthesia Plan Comments:         Anesthesia Quick Evaluation

## 2015-02-25 NOTE — CV Procedure (Signed)
   Electrical Cardioversion Procedure Note AKEEM HEPPLER [id number] [birth year]  Procedure: Electrical Cardioversion Indications:  Atrial Fibrillation  Time Out: Verified patient identification, verified procedure,medications/allergies/relevent history reviewed, required imaging and test results available.  Performed  Procedure Details  The patient was NPO after midnight. Anesthesia was administered at the beside  by Dr.Edwards with 70mg  of propofol and 60mg  of lidocaine.  Cardioversion was done with synchronized biphasic defibrillation with AP pads with 150watts.  The patient converted to normal sinus rhythm. The patient tolerated the procedure well   IMPRESSION:  Successful cardioversion of atrial fibrillation    TURNER,TRACI R 02/25/2015, 12:58 PM

## 2015-02-25 NOTE — Transfer of Care (Signed)
Immediate Anesthesia Transfer of Care Note  Patient: Jeremy Tucker  Procedure(s) Performed: Procedure(s): CARDIOVERSION (N/A)  Patient Location: Endoscopy Unit  Anesthesia Type:General  Level of Consciousness: awake, alert  and oriented  Airway & Oxygen Therapy: Patient Spontanous Breathing  Post-op Assessment: Report given to RN, Post -op Vital signs reviewed and stable and Patient moving all extremities X 4  Post vital signs: Reviewed and stable  Last Vitals:  Filed Vitals:   02/25/15 1206  BP: 124/80  Pulse: 94  Temp: 36.8 C  Resp: 18    Complications: No apparent anesthesia complications

## 2015-02-25 NOTE — Interval H&P Note (Signed)
History and Physical Interval Note:  02/25/2015 12:54 PM  Jeremy Tucker  has presented today for surgery, with the diagnosis of a fib  The various methods of treatment have been discussed with the patient and family. After consideration of risks, benefits and other options for treatment, the patient has consented to  Procedure(s): CARDIOVERSION (N/A) as a surgical intervention .  The patient's history has been reviewed, patient examined, no change in status, stable for surgery.  I have reviewed the patient's chart and labs.  Questions were answered to the patient's satisfaction.     TURNER,TRACI R

## 2015-02-25 NOTE — Addendum Note (Signed)
Encounter addended by: Sherran Needs, NP on: 02/25/2015 12:03 PM<BR>     Documentation filed: Notes Section

## 2015-02-25 NOTE — Anesthesia Postprocedure Evaluation (Signed)
  Anesthesia Post-op Note  Patient: Jeremy Tucker  Procedure(s) Performed: Procedure(s): CARDIOVERSION (N/A)  Patient Location: PACU  Anesthesia TypeMac  Level of Consciousness: awake  Airway and Oxygen Therapy: Patient Spontanous Breathing  Post-op Pain: mild  Post-op Assessment: Post-op Vital signs reviewed  Post-op Vital Signs: Reviewed  Last Vitals:  Filed Vitals:   02/25/15 1340  BP: 125/69  Pulse: 70  Temp:   Resp: 20    Complications: No apparent anesthesia complications

## 2015-02-25 NOTE — Discharge Instructions (Signed)

## 2015-02-25 NOTE — Telephone Encounter (Signed)
Please have Mr. Roskelley come in and see me on 02/26/15. Will need an ECG. Sounds like back in AF.

## 2015-02-25 NOTE — H&P (View-Only) (Signed)
Jeremy Tucker [facility] NUCLEAR MED [address] [place], [place] [address] [phone]    Cardiology Nuclear Med Study  Jeremy Tucker is a 64 y.o. male     MRN : [id number]     DOB: [birth year]  Procedure Date: 02/04/2015  Nuclear Med Background Indication for Stress Test:  Evaluation for Ischemia and [facility]: Atrial Fib with RVR;started pm Flecainide History:  No known CAD, MPI (normal) ~1-2 yrs. ago, Asthma Cardiac Risk Factors: Atrial Fib  Symptoms:  None indicated   Nuclear Pre-Procedure Caffeine/Decaff Intake:  None NPO After: 10:00pm   Lungs:  clear O2 Sat: 94% on room air. IV 0.9% NS with Angio Cath:  22g  IV Site: R Hand  IV Started by:  Matilde Haymaker, RN  Chest Size (in):  46 Cup Size: n/a  Height: 6' (1.829 m)  Weight:  234 lb (106.142 kg)  BMI:  Body mass index is 31.73 kg/(m^2). Tech Comments:  n/a    Nuclear Med Study 1 or 2 day study: 1 day  Stress Test Type:  Stress  Reading MD: n/a  Order Authorizing Provider:  Mallie Mussel Smith,MD  Resting Radionuclide: Technetium 63m Sestamibi  Resting Radionuclide Dose: 11.0 mCi   Stress Radionuclide:  Technetium 69m Sestamibi  Stress Radionuclide Dose: 33.0 mCi           Stress Protocol Rest HR: 82 Stress HR: 137  Rest BP: 134/83 Stress BP: 169/67  Exercise Time (min): 8:30 METS: 10.1   Predicted Max HR: 156 bpm % Max HR: 87.82 bpm Rate Pressure Product: 23153   Dose of Adenosine (mg):  n/a Dose of Lexiscan: n/a mg  Dose of Atropine (mg): n/a Dose of Dobutamine: n/a mcg/kg/min (at max HR)  Stress Test Technologist: Glade Lloyd, BS-ES  Nuclear Technologist:  Earl Many, CNMT     Rest Procedure:  Myocardial perfusion imaging was performed at rest 45 minutes following the intravenous administration of Technetium 35m Sestamibi. Rest ECG: NSR with non-specific ST-T wave changes  Stress Procedure:  The patient exercised on the treadmill utilizing the Bruce Protocol for 8:30 minutes. The patient  stopped due to fatigue and denied any chest pain.  Technetium 71m Sestamibi was injected at peak exercise and myocardial perfusion imaging was performed after a brief delay. Stress ECG: No significant change from baseline ECG  QPS Raw Data Images:  Normal; no motion artifact; normal heart/lung ratio. diaphragmatic attenuation noted. Stress Images:  There is decreased radiotracer uptake seen from base to mid along the inferior wall distribution at both rest and stress, moderate to severe in severity, fixed, likely secondary to diaphragmatic attenuation versus bowel loop attenuation. Rest Images:  As above Subtraction (SDS):  No evidence of ischemia. Transient Ischemic Dilatation (Normal <1.22):  1.02 Lung/Heart Ratio (Normal <0.45):  0.25  Quantitative Gated Spect Images QGS EDV:  100 ml QGS ESV:  47 ml  Impression Exercise Capacity:  Fair exercise capacity. BP Response:  Normal blood pressure response. Clinical Symptoms:  No significant symptoms noted. ECG Impression:  No significant ST segment change suggestive of ischemia. There are no adverse arrhythmias during exercise treadmill. QRS does not widen. Comparison with Prior Nuclear Study: No images to compare  Overall Impression:  Normal stress nuclear study. There is no evidence of ischemia identified. Sensitivity and specificity of the study reduced by noted bowel loop as well as diaphragmatic attenuation.  LV Ejection Fraction: 53%.  LV Wall Motion:  NL LV Function; NL Wall Motion   No adverse  arrhythmias, no widening of QRS complex. Patient currently taking flecainide.  Candee Furbish, MD

## 2015-02-26 ENCOUNTER — Other Ambulatory Visit (HOSPITAL_COMMUNITY): Payer: BLUE CROSS/BLUE SHIELD

## 2015-02-26 ENCOUNTER — Ambulatory Visit (HOSPITAL_COMMUNITY)
Admission: RE | Admit: 2015-02-26 | Discharge: 2015-02-26 | Disposition: A | Discharge status: Home / Self Care | Payer: BLUE CROSS/BLUE SHIELD | Source: Ambulatory Visit | Attending: Nurse Practitioner | Admitting: Nurse Practitioner

## 2015-02-26 ENCOUNTER — Encounter (HOSPITAL_COMMUNITY): Payer: Self-pay | Admitting: Cardiology

## 2015-02-26 ENCOUNTER — Telehealth: Payer: Self-pay | Admitting: Interventional Cardiology

## 2015-02-26 DIAGNOSIS — I4891 Unspecified atrial fibrillation: Secondary | ICD-10-CM | POA: Insufficient documentation

## 2015-02-26 DIAGNOSIS — I350 Nonrheumatic aortic (valve) stenosis: Secondary | ICD-10-CM | POA: Insufficient documentation

## 2015-02-26 DIAGNOSIS — I480 Paroxysmal atrial fibrillation: Secondary | ICD-10-CM | POA: Diagnosis not present

## 2015-02-26 NOTE — Telephone Encounter (Signed)
New message      Pt had an echo today and had a procedure yesterday.  He want to see Dr Tamala Julian tomorrow to get his echo results.  His wife will be here tomorrow afternoon and he said "he does not want to wait until may" to get his results.  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  Please call home first then cell.

## 2015-02-26 NOTE — Addendum Note (Signed)
Encounter addended by: Martie Lee on: 02/26/2015  9:14 AM<BR>     Documentation filed: Charges VN

## 2015-02-26 NOTE — Telephone Encounter (Signed)
Pt has spoken with Dr.Smith directly, and will not need to be seen

## 2015-02-26 NOTE — Telephone Encounter (Signed)
Returned pt call. Pt is doing well. Adv him that Dr.Smith does not have any availability tomorrow his schedule is full. Pt has questions and would like his echo results that was performed today. Adv pt I will fwd Dr.Smith a message to call him.he verbalized understanding.

## 2015-02-26 NOTE — Progress Notes (Signed)
  Echocardiogram 2D Echocardiogram has been performed.  Jeremy Tucker 02/26/2015, 3:48 PM

## 2015-02-27 ENCOUNTER — Other Ambulatory Visit: Payer: Self-pay

## 2015-02-27 ENCOUNTER — Other Ambulatory Visit: Payer: Self-pay | Admitting: Family Medicine

## 2015-02-27 DIAGNOSIS — I480 Paroxysmal atrial fibrillation: Secondary | ICD-10-CM

## 2015-03-01 LAB — FLECAINIDE LEVEL: Flecainide: 0.11 ug/mL — ABNORMAL LOW (ref 0.20–1.00)

## 2015-03-02 ENCOUNTER — Other Ambulatory Visit (HOSPITAL_COMMUNITY): Payer: Self-pay | Admitting: *Deleted

## 2015-03-02 ENCOUNTER — Telehealth: Payer: Self-pay | Admitting: Interventional Cardiology

## 2015-03-02 ENCOUNTER — Telehealth (HOSPITAL_COMMUNITY): Payer: Self-pay | Admitting: *Deleted

## 2015-03-02 ENCOUNTER — Telehealth: Payer: Self-pay | Admitting: Family Medicine

## 2015-03-02 MED ORDER — FLECAINIDE ACETATE 100 MG PO TABS: 100.0000 mg | ORAL_TABLET | Freq: Two times a day (BID) | ORAL | Status: DC

## 2015-03-02 NOTE — Telephone Encounter (Signed)
Is this okay?

## 2015-03-02 NOTE — Telephone Encounter (Signed)
Pt back in Afib as of this am--pls advise [phone]

## 2015-03-02 NOTE — Telephone Encounter (Signed)
Called patient to inform him that his flecainide level was low - he went on to tell me about his morning - not feeling well, bp up and down HR 109-140. Informed him that we will increase his Flecainide to 100mg  BID - will call in RX for him.  He will come back in on Wednesday for EKG and if still in Afib will more than likely need referral to Dr. [name] for ablation.  Patient states he will call us back this afternoon and let us know how he is feeling and set up time for EKG. Patient was appreciative of my call and will back later

## 2015-03-02 NOTE — Telephone Encounter (Signed)
Addressed by Townsend Roger @ the Afib clinic

## 2015-03-02 NOTE — Telephone Encounter (Signed)
Fax refill request from [facility]  Bupropion XL 150mg   (24H) #90 Last filled  11/30/14

## 2015-03-02 NOTE — Telephone Encounter (Signed)
Pt aware of Dr.Smith's additional recommendation. Pt should not go in to work today. He should stay home and relax as not to aggravate he will call the afib clinic this afternoon with an update of how he is doing. Pt agreeable and verbalized understanding.

## 2015-03-04 ENCOUNTER — Ambulatory Visit (HOSPITAL_COMMUNITY)
Admission: RE | Admit: 2015-03-04 | Discharge: 2015-03-04 | Disposition: A | Discharge status: Home / Self Care | Payer: BLUE CROSS/BLUE SHIELD | Source: Ambulatory Visit | Attending: Nurse Practitioner | Admitting: Nurse Practitioner

## 2015-03-04 ENCOUNTER — Encounter (HOSPITAL_COMMUNITY): Payer: Self-pay | Admitting: Nurse Practitioner

## 2015-03-04 VITALS — BP 122/86 | HR 77 | Ht 72.0 in | Wt 236.6 lb

## 2015-03-04 DIAGNOSIS — I350 Nonrheumatic aortic (valve) stenosis: Secondary | ICD-10-CM | POA: Diagnosis not present

## 2015-03-04 DIAGNOSIS — I481 Persistent atrial fibrillation: Secondary | ICD-10-CM | POA: Insufficient documentation

## 2015-03-04 DIAGNOSIS — I10 Essential (primary) hypertension: Secondary | ICD-10-CM | POA: Insufficient documentation

## 2015-03-04 DIAGNOSIS — E669 Obesity, unspecified: Secondary | ICD-10-CM | POA: Insufficient documentation

## 2015-03-04 DIAGNOSIS — I4819 Other persistent atrial fibrillation: Secondary | ICD-10-CM

## 2015-03-04 NOTE — Progress Notes (Signed)
.   Patient ID: Jeremy Tucker, male   DOB: 08-Apr-1951, 64 y.o.   MRN: [id number]   Primary Care Physician: [name], MDCardiologist: Dr. Pernell Dupre   Jeremy Tucker is a 64 y.o. male with a h/o Afib initially diagnosed in March of this year and hospitalized twice in a matter of days.He had a URI and had been taking decongestants.Started on sotalol but d/c due to wheezing, set up for cardioversion, but converted on flecainide 50 mg bid.. Placed on eliquis 5 mg bid for chadsvasc of 1(HTN)  but will turn 64 in August so essentially has a score of 2. He had been doing well but was seen initially in afib clinic last week with return of Afib with RVR. He was started on cardizem, flecainide level was obtained in anticipation of increasing drug. By the next day, he continued in afib although slower. He is very symptomatic with Afib, " I have to go to bed when I am out of rhythm, I feel so bad." DCCV was scheduled and he did cardiovert successfully 4/13 but woke up in afib 4/18. At than time flecainide level had returned at 0.11(trough level) and dose increased to 100 mg bid. He returns today, just converting to SR this am. C/o 6/10, left chest discomfort that he can pinpoint with one finger and pain can be reproduced with palpation, problem MS pain. He does have an appointment with Dr. [name] for evaluation for ablation on Monday. Feels significantly improved in SR. Has also had headaches since new drugs have been added.Pending sleep study although wife states no significant snoring or witnessed apneic spells. He had an echo 4/15 that show progression of AS from mild to moderate.  Today, he denies symptoms ofchest pain,  orthopnea, PND, lower extremity edema, , presyncope, syncope, or neurologic sequela. Positive for mild dyspnea, lightheadedness, mild diaphoresis. The patient is tolerating medications without difficulties and is otherwise without complaint today.   Past Medical History    Diagnosis Date  . Hyperlipidemia   . GERD (gastroesophageal reflux disease)   . BPH (benign prostatic hyperplasia)   . Gout   . Obesity   . PAF (paroxysmal atrial fibrillation)     a. CHADsVASc score 0. possible DM. Placed on Eliquis and Flecainide on 01/28/15 admission   . Aortic stenosis, mild   . Kidney stones   . DVT (deep venous thrombosis)   . Depression    Past Surgical History  Procedure Laterality Date  . Colonoscopy  2010    MEDOFF  . Total knee arthroplasty Left 2001    MURPHY  . Tonsillectomy    . Joint replacement    . Knee arthroscopy Bilateral "multiple times"  . Cystoscopy w/ stone manipulation  1980's    "couldn't get stone so they had to cut me open"  . Kidney stone surgery  1980's    "stone lodged in the duct; had to cut me open to get it"  . Fracture surgery    . Forearm fracture surgery Right ~ 1961  . Cardiac catheterization  1990's    "Dr. Melvern Banker"  . Cardioversion N/A 02/25/2015    Procedure: CARDIOVERSION;  Surgeon: Sueanne Margarita, MD;  Location: [facility];  Service: Cardiovascular;  Laterality: N/A;    Current Outpatient Prescriptions  Medication Sig Dispense Refill  . allopurinol (ZYLOPRIM) 300 MG tablet Take 300 mg by mouth daily.      Marland Kitchen apixaban (ELIQUIS) 5 MG TABS tablet Take 1 tablet (5  mg total) by mouth 2 (two) times daily. 60 tablet 0  . ASMANEX 30 METERED DOSES 220 MCG/INH inhaler INHALE TWO PUFFS BY MOUTH TWICE DAILY (Patient taking differently: INHALE TWO PUFFS BY MOUTH TWICE DAILY as needed) 1 Inhaler 11  . atorvastatin (LIPITOR) 20 MG tablet Take 1 tablet (20 mg total) by mouth daily at 6 PM. 90 tablet 0  . buPROPion (WELLBUTRIN XL) 150 MG 24 hr tablet TAKE 1 TABLET BY MOUTH EVERY DAY 90 tablet 1  . buPROPion (WELLBUTRIN XL) 300 MG 24 hr tablet Take 1 tablet (300 mg total) by mouth daily. 90 tablet 1  . busPIRone (BUSPAR) 5 MG tablet Take 1 tablet (5 mg total) by mouth 3 (three) times daily. 60 tablet 1  . diltiazem (CARDIZEM CD) 180  MG 24 hr capsule Take 1 capsule (180 mg total) by mouth daily. 30 capsule 3  . EFFER-K 20 MEQ TBEF Take 20 mEq by mouth daily.  9  . fish oil-omega-3 fatty acids 1000 MG capsule Take 1 g by mouth daily.     . flecainide (TAMBOCOR) 100 MG tablet Take 1 tablet (100 mg total) by mouth 2 (two) times daily. 60 tablet 3  . lisinopril-hydrochlorothiazide (PRINZIDE,ZESTORETIC) 10-12.5 MG per tablet Take 0.5 tablets by mouth daily.    Marland Kitchen loratadine (CLARITIN) 10 MG tablet Take 10 mg by mouth daily.    . Multiple Vitamins-Minerals (MULTIVITAMIN WITH MINERALS) tablet Take 1 tablet by mouth daily.      Marland Kitchen olopatadine (PATANOL) 0.1 % ophthalmic solution Place 1 drop into both eyes daily as needed for allergies.    Marland Kitchen omeprazole (PRILOSEC) 20 MG capsule Take 20 mg by mouth daily.    . silodosin (RAPAFLO) 4 MG CAPS capsule Take 4 mg by mouth every evening.     . tadalafil (CIALIS) 5 MG tablet Take 5 mg by mouth daily as needed for erectile dysfunction.     Marland Kitchen zolpidem (AMBIEN) 10 MG tablet TAKE 1 TABLET BY MOUTH EVERY DAY AT BEDTIME AS NEEDED FOR SLEEP (Patient taking differently: Take 3.34 mg by mouth at bedtime. Patient states he breaks tablet into 3rds) 90 tablet 0   No current facility-administered medications for this encounter.    Allergies  Allergen Reactions  . Codeine Itching  . Lisinopril Other (See Comments)    dizziness  . Oxycodone Itching    History   Social History  . Marital Status: Married    Spouse Name: N/A  . Number of Children: N/A  . Years of Education: N/A   Occupational History  . Not on file.   Social History Main Topics  . Smoking status: Never Smoker   . Smokeless tobacco: Never Used  . Alcohol Use: Yes     Comment: 01/21/2015 "maybe 4-5 drinks/yr"  . Drug Use: No  . Sexual Activity: Yes   Other Topics Concern  . Not on file   Social History Narrative    Family History  Problem Relation Age of Onset  . Heart attack Mother 54    MI  . Heart attack Father 80      ? MI versus trauma to head    ROS- All systems are reviewed and negative except as per the HPI above  Physical Exam: Filed Vitals:   03/04/15 1110  BP: 122/86  Pulse: 77  Height: 6' (1.829 m)  Weight: 236 lb 9.6 oz (107.321 kg)    GEN- The patient is well appearing, alert and oriented x 3 today, but slightly  anxious appearing.   Head- normocephalic, atraumatic Eyes-  Sclera clear, conjunctiva pink Ears- hearing intact Oropharynx- clear Neck- supple, no JVP Lymph- no cervical lymphadenopathy Lungs- Clear to ausculation bilaterally, normal work of breathing Heart- Rapid,regular rate and rhythm, 5-8/0 systolic murmur with radiation to the carotids. No rubs or gallops, PMI not laterally displaced, palpation of the left chest wall with reproducible pain. GI- soft, NT, ND, + BS Extremities- no clubbing, cyanosis, or edema MS- no significant deformity or atrophy Skin- no rash or lesion Psych- euthymic mood, full affect Neuro- strength and sensation are intact  EKG- Sinus rhythm with Pac's, Pr int 172 ms/QRS 94 ms/QTc 445 ms.  Echo --11/15  Left ventricle: The cavity size was normal. Systolic function was normal. The estimated ejection fraction was in the range of 60% to 65%. Wall motion was normal; there were no regional wall motion abnormalities. - Aortic valve: Cusp separation was reduced. There was moderate stenosis. There was mild regurgitation. Peak velocity (S): 341 cm/s. Mean gradient (S): 25 mm Hg. Valve area (Vmax): 1.47 cm^2. Valve area (Vmean): 1.61 cm^2. - Mitral valve: There was mild regurgitation. - Left atrium: The atrium was moderately dilated. Anterior-posterior dimension: 49 mm.  Impressions:  - Since prior study, aortic stenosis has advanced.  Assessment and Plan:  1.  Very symptomatic persistent Afib, ERAF after DCCV 4/13 and converted to SR on higher dose of flecainide after 48 hours. Continue Cardizem/flecainde 100 bid Continue  eliquis with chadsvasc of 2. F/u with Dr. [name] on Monday to discuss afib ablation  2. Htn  Well managed  3. Moderate Aortic Stenosis F/u with Dr. Linard Millers as scheduled   4. Possible sleep apnea Sleep study pending  5. Obesity Working on weight loss. Has lost 20t pounds in last year.

## 2015-03-05 ENCOUNTER — Ambulatory Visit (INDEPENDENT_AMBULATORY_CARE_PROVIDER_SITE_OTHER): Payer: BLUE CROSS/BLUE SHIELD | Admitting: Family Medicine

## 2015-03-05 ENCOUNTER — Encounter: Payer: Self-pay | Admitting: Family Medicine

## 2015-03-05 VITALS — BP 130/84 | HR 75 | Wt 237.0 lb

## 2015-03-05 DIAGNOSIS — I4891 Unspecified atrial fibrillation: Secondary | ICD-10-CM

## 2015-03-05 NOTE — Progress Notes (Signed)
   Subjective:    Patient ID: Jeremy Tucker, male    DOB: 01/19/1951, 64 y.o.   MRN: [id number]  HPI He is here for consultation concerning his overall health. He has had a very rough several weeks dealing with atrial fibrillation. He has been admitted on several occasions because of this. Presently he is on medication as well as had a cardioversion. He has still had breakthroughs of his atrial fibrillation which makes him quite fatigued. He has been set up for a sleep study however this apparently will be accelerated. He also is scheduled in the near future for an ablation. This has interfered with the quality of his life and made him quite fatigued.   Review of Systems     Objective:   Physical Exam Alert and in no distress otherwise not examined       Assessment & Plan:  Atrial fibrillation with RVR I reassured him that everything is being done appropriately. Discussed the sleep apnea study with him and possible interventions depending upon the results. I will work with him concerning proper care if he does have OSA in regard to equipment and possible even dental appliances.

## 2015-03-06 ENCOUNTER — Telehealth (HOSPITAL_COMMUNITY): Payer: Self-pay | Admitting: *Deleted

## 2015-03-06 MED ORDER — DILTIAZEM HCL 30 MG PO TABS: ORAL_TABLET | ORAL | Status: DC

## 2015-03-06 NOTE — Telephone Encounter (Addendum)
Patient called in this morning at 8am stating he was back in AFib -- HR in 90s and BP 120/91 patient is very symptomatic (fatigue, slightly SOB) denies chest pain or lightheadedness.  Patient had taken his medication about 1 hour prior to call. Encouraged pt to lay back down for few hours give medication time to work and call me back in 2 hours to report symptoms.    Patient called back at 10am stating he was not feeling much better; his HR was in 80s and BP was 97/69 feeling slightly dizzy.  Discussed with Roderic Palau, NP will call in prescription for short acting cardizem that he can use for breakthrough AFib if SBP is greater than 110 and HR greater than 100. Instructed pt to try and relax and let medications work.  He will call back this afternoon and let us know how he is feeling.  Called back at 1pm HR and BP had normalized. He was feeling some better. Encouraged him to have the cardizem 30mg  on hand in case needed over weekend. Patient was appreciative of our assistance with his Afib. Has an appointment on Monday with Dr. [name].

## 2015-03-08 ENCOUNTER — Ambulatory Visit (HOSPITAL_BASED_OUTPATIENT_CLINIC_OR_DEPARTMENT_OTHER): Payer: BLUE CROSS/BLUE SHIELD | Attending: Interventional Cardiology

## 2015-03-08 VITALS — Ht 72.0 in | Wt 230.0 lb

## 2015-03-08 DIAGNOSIS — I519 Heart disease, unspecified: Secondary | ICD-10-CM

## 2015-03-08 DIAGNOSIS — I480 Paroxysmal atrial fibrillation: Secondary | ICD-10-CM

## 2015-03-08 DIAGNOSIS — G4710 Hypersomnia, unspecified: Secondary | ICD-10-CM | POA: Diagnosis present

## 2015-03-08 DIAGNOSIS — G4733 Obstructive sleep apnea (adult) (pediatric): Secondary | ICD-10-CM | POA: Insufficient documentation

## 2015-03-08 DIAGNOSIS — I4891 Unspecified atrial fibrillation: Secondary | ICD-10-CM | POA: Insufficient documentation

## 2015-03-08 DIAGNOSIS — I493 Ventricular premature depolarization: Secondary | ICD-10-CM | POA: Diagnosis not present

## 2015-03-09 ENCOUNTER — Ambulatory Visit (INDEPENDENT_AMBULATORY_CARE_PROVIDER_SITE_OTHER): Payer: BLUE CROSS/BLUE SHIELD | Admitting: Internal Medicine

## 2015-03-09 ENCOUNTER — Telehealth: Payer: Self-pay | Admitting: Cardiology

## 2015-03-09 ENCOUNTER — Encounter: Payer: Self-pay | Admitting: Internal Medicine

## 2015-03-09 VITALS — BP 114/72 | HR 84 | Ht 73.0 in | Wt 239.6 lb

## 2015-03-09 DIAGNOSIS — I480 Paroxysmal atrial fibrillation: Secondary | ICD-10-CM | POA: Diagnosis not present

## 2015-03-09 DIAGNOSIS — G4733 Obstructive sleep apnea (adult) (pediatric): Secondary | ICD-10-CM | POA: Insufficient documentation

## 2015-03-09 LAB — CBC WITH DIFFERENTIAL/PLATELET
Basophils Absolute: 0 10*3/uL (ref 0.0–0.1)
Basophils Relative: 0.4 % (ref 0.0–3.0)
Eosinophils Absolute: 0.2 10*3/uL (ref 0.0–0.7)
Eosinophils Relative: 2 % (ref 0.0–5.0)
HCT: 42.8 % (ref 39.0–52.0)
Hemoglobin: 14.8 g/dL (ref 13.0–17.0)
Lymphocytes Relative: 15.1 % (ref 12.0–46.0)
Lymphs Abs: 1.3 10*3/uL (ref 0.7–4.0)
MCHC: 34.5 g/dL (ref 30.0–36.0)
MCV: 92.3 fl (ref 78.0–100.0)
Monocytes Absolute: 0.6 10*3/uL (ref 0.1–1.0)
Monocytes Relative: 7.6 % (ref 3.0–12.0)
Neutro Abs: 6.4 10*3/uL (ref 1.4–7.7)
Neutrophils Relative %: 74.9 % (ref 43.0–77.0)
Platelets: 232 10*3/uL (ref 150.0–400.0)
RBC: 4.64 Mil/uL (ref 4.22–5.81)
RDW: 13.1 % (ref 11.5–15.5)
WBC: 8.5 10*3/uL (ref 4.0–10.5)

## 2015-03-09 LAB — BASIC METABOLIC PANEL
BUN: 13 mg/dL (ref 6–23)
CO2: 31 mEq/L (ref 19–32)
Calcium: 9.5 mg/dL (ref 8.4–10.5)
Chloride: 100 mEq/L (ref 96–112)
Creatinine, Ser: 0.77 mg/dL (ref 0.40–1.50)
GFR: 107.88 mL/min (ref >60.00)
Glucose, Bld: 134 mg/dL — ABNORMAL HIGH (ref 70–99)
Potassium: 4.2 mEq/L (ref 3.5–5.1)
Sodium: 136 mEq/L (ref 135–145)

## 2015-03-09 NOTE — Progress Notes (Signed)
Electrophysiology Office Note   Date:  03/09/2015   ID:  Jeremy Tucker, DOB 1951/07/25, MRN [id number]  PCP:  [name], MD  Cardiologist:  Dr Tamala Julian Primary Electrophysiologist: [name], MD    Chief Complaint  Patient presents with  . AFIB w/RVR     History of Present Illness: Jeremy Tucker is a 64 y.o. male who presents today for electrophysiology evaluation.   He is a difficulty historian and has difficulty recalling a fair bit of his history.  History is therefore primarily from epic and confirmed by pts wife.  He has persistent Afib initially diagnosed in March of this year and hospitalized twice in a matter of days.He had a URI and had been taking decongestants (sudafed and mucinex D). Started on sotalol but d/c due to wheezing, set up for cardioversion, but converted on flecainide 50 mg bid. Placed on eliquis 5 mg bid for chadsvasc of 1(HTN)  but will turn 64 in August so essentially has a score of 2.  He had been doing well but was seen initially in afib clinic last week with return of Afib with RVR. He was started on cardizem, flecainide level was obtained in anticipation of increasing drug. By the next day, he continued in afib although slower. He is very symptomatic with Afib, " I have to go to bed when I am out of rhythm, I feel so bad." DCCV was scheduled and he did cardiovert successfully 4/13 but woke up in afib 4/18. At than time flecainide level had returned at 0.11(trough level) and dose increased to 100 mg bid.  He is in sinus today.  He was also in sinus in the AF clinic.  He thinks that he is in afib all of the time.  He reports symptoms of fatigue and SOB.  He has palpitations. He had sleep study last night.  Results are pending.   Today, he denies symptoms of chest pain,  orthopnea, PND, lower extremity edema, claudication, dizziness, presyncope, syncope, bleeding, or neurologic sequela. The patient is tolerating medications without difficulties and is  otherwise without complaint today.    Past Medical History  Diagnosis Date  . Hyperlipidemia   . GERD (gastroesophageal reflux disease)   . BPH (benign prostatic hyperplasia)   . Gout     denies  . Obesity   . Persistent atrial fibrillation     a. CHADsVASc score at least 2  . Aortic stenosis, mild   . Kidney stones   . DVT (deep venous thrombosis)   . Depression   . Hypertension     denies  . Glucose intolerance (impaired glucose tolerance)   . DJD (degenerative joint disease)    Past Surgical History  Procedure Laterality Date  . Colonoscopy  2010    MEDOFF  . Total knee arthroplasty Left 2001    [facility]  . Tonsillectomy    . Joint replacement    . Knee arthroscopy Bilateral "multiple times"  . Cystoscopy w/ stone manipulation  1980's    "couldn't get stone so they had to cut me open"  . Kidney stone surgery  1980's    "stone lodged in the duct; had to cut me open to get it"  . Fracture surgery    . Forearm fracture surgery Right ~ 1961  . Cardiac catheterization  1990's    "Dr. Melvern Banker", no blockages per pt  . Cardioversion N/A 02/25/2015    Procedure: CARDIOVERSION;  Surgeon: Sueanne Margarita, MD;  Location: [facility]  ENDOSCOPY;  Service: Cardiovascular;  Laterality: N/A;     Current Outpatient Prescriptions  Medication Sig Dispense Refill  . allopurinol (ZYLOPRIM) 300 MG tablet Take 300 mg by mouth daily.      Marland Kitchen apixaban (ELIQUIS) 5 MG TABS tablet Take 1 tablet (5 mg total) by mouth 2 (two) times daily. 60 tablet 0  . atorvastatin (LIPITOR) 20 MG tablet Take 1 tablet (20 mg total) by mouth daily at 6 PM. 90 tablet 0  . buPROPion (WELLBUTRIN XL) 300 MG 24 hr tablet Take 1 tablet (300 mg total) by mouth daily. 90 tablet 1  . busPIRone (BUSPAR) 5 MG tablet Take 1 tablet (5 mg total) by mouth 3 (three) times daily. 60 tablet 1  . diltiazem (CARDIZEM CD) 180 MG 24 hr capsule Take 1 capsule (180 mg total) by mouth daily. 30 capsule 3  . diltiazem (CARDIZEM) 30 MG tablet Take  one tablet every 4 - 6 hours as needed for breatkthrough AFib -- if heart rate is greater than 456 and systolic BP is greater than 110. 20 tablet 1  . EFFER-K 20 MEQ TBEF Take 20 mEq by mouth daily.  9  . fish oil-omega-3 fatty acids 1000 MG capsule Take 1 g by mouth daily.     . flecainide (TAMBOCOR) 100 MG tablet Take 1 tablet (100 mg total) by mouth 2 (two) times daily. 60 tablet 3  . lisinopril-hydrochlorothiazide (PRINZIDE,ZESTORETIC) 10-12.5 MG per tablet Take 0.5 tablets by mouth daily.    Marland Kitchen loratadine (CLARITIN) 10 MG tablet Take 10 mg by mouth daily.    . mometasone (ASMANEX) 220 MCG/INH inhaler Inhale 2 puffs into the lungs 2 (two) times daily as needed (Shortness of breath or wheezing).    . Multiple Vitamins-Minerals (MULTIVITAMIN WITH MINERALS) tablet Take 1 tablet by mouth daily.      Marland Kitchen olopatadine (PATANOL) 0.1 % ophthalmic solution Place 1 drop into both eyes daily as needed for allergies.    Marland Kitchen omeprazole (PRILOSEC) 20 MG capsule Take 20 mg by mouth daily.    . silodosin (RAPAFLO) 4 MG CAPS capsule Take 4 mg by mouth every evening.     . zolpidem (AMBIEN) 10 MG tablet Take 1/3 tablet by mouth at bedtime as needed for sleep     No current facility-administered medications for this visit.    Allergies:   Codeine and Oxycodone   Social History:  The patient  reports that he has never smoked. He has never used smokeless tobacco. He reports that he drinks alcohol. He reports that he does not use illicit drugs.   Family History:  The patient's  family history includes Heart attack (age of onset: 14) in his father; Heart attack (age of onset: 53) in his mother.    ROS:  Please see the history of present illness.   All other systems are reviewed and negative.    PHYSICAL EXAM: VS:  BP 114/72 mmHg  Pulse 84  Ht 6\' 1"  (1.854 m)  Wt 239 lb 9.6 oz (108.682 kg)  BMI 31.62 kg/m2 , BMI Body mass index is 31.62 kg/(m^2). GEN: Well nourished, well developed, in no acute  distress HEENT: normal Neck: no JVD, carotid bruits, or masses Cardiac: RRR; no murmurs, rubs, or gallops,no edema  Respiratory:  clear to auscultation bilaterally, normal work of breathing GI: soft, nontender, nondistended, + BS MS: no deformity or atrophy Skin: warm and dry  Neuro:  Strength and sensation are intact Psych: seems easily agitated during exam today  EKG:  EKG is ordered today. The ekg ordered today shows sinus rhythm with PACs,    Recent Labs: 09/23/2014: ALT 33 01/26/2015: B Natriuretic Peptide 121.4*; Magnesium 1.8; TSH 1.309 02/25/2015: BUN 14; Creatinine 0.71; Hemoglobin 15.5; Platelets 233; Potassium 3.9; Sodium 138    Lipid Panel     Component Value Date/Time   CHOL 153 09/23/2014 1007   TRIG 234* 09/23/2014 1007   HDL 43 09/23/2014 1007   CHOLHDL 3.6 09/23/2014 1007   VLDL 47* 09/23/2014 1007   LDLCALC 63 09/23/2014 1007     Wt Readings from Last 3 Encounters:  03/09/15 239 lb 9.6 oz (108.682 kg)  03/08/15 230 lb (104.327 kg)  03/05/15 237 lb (107.502 kg)      Other studies Reviewed: Additional studies/ records that were reviewed today include: echo, myoview, and AF clinic notes  Review of the above records today demonstrates: low risk myoview, preserved EF with mild LA enlargement   ASSESSMENT AND PLAN:  1.  Persistent atrial fibrillation The patient has symptomatic afib.  He has failed medical therapy with sotalol and flecainide.  Therapeutic strategies for afib including medicine and ablation were discussed in detail with the patient today. Risk, benefits, and alternatives to EP study and radiofrequency ablation for afib were also discussed in detail today. These risks include but are not limited to stroke, bleeding, vascular damage, tamponade, perforation, damage to the esophagus, lungs, and other structures, pulmonary vein stenosis, worsening renal function, and death. The patient understands these risk and wishes to proceed.  We will  therefore proceed with catheter ablation at the next available time.  chads2vasc score is 1-2 (he denies htn and glucose interolance).   2. HTN Stable No change required today    Current medicines are reviewed at length with the patient today.   The patient does not have concerns regarding his medicines.  The following changes were made today:  none   Signed, [name], MD  03/09/2015 9:44 AM     [facility] [address] [place]  [address] [phone] (office) [phone] (fax)

## 2015-03-09 NOTE — Patient Instructions (Signed)
Medication Instructions:  Your physician recommends that you continue on your current medications as directed. Please refer to the Current Medication list given to you today.   Labwork: Your physician recommends that you return for lab work today CBC/BMP   Testing/Procedures: Your physician has recommended that you have an ablation. Catheter ablation is a medical procedure used to treat some cardiac arrhythmias (irregular heartbeats). During catheter ablation, a long, thin, flexible tube is put into a blood vessel in your groin (upper thigh), or neck. This tube is called an ablation catheter. It is then guided to your heart through the blood vessel. Radio frequency waves destroy small areas of heart tissue where abnormal heartbeats may cause an arrhythmia to start. Please see the instruction sheet given to you today.  See instruction sheet for procedure   Follow-Up: Will get follow up at hospital  Any Other Special Instructions Will Be Listed Below (If Applicable).  Cardiac Ablation Cardiac ablation is a procedure to disable a small amount of heart tissue in very specific places. The heart has many electrical connections. Sometimes these connections are abnormal and can cause the heart to beat very fast or irregularly. By disabling some of the problem areas, heart rhythm can be improved or made normal. Ablation is done for people who:   Have Wolff-Parkinson-White syndrome.   Have other fast heart rhythms (tachycardia).   Have taken medicines for an abnormal heart rhythm (arrhythmia) that resulted in:   No success.   Side effects.   May have a high-risk heartbeat that could result in death.  LET [facility] CARE PROVIDER KNOW ABOUT:   Any allergies you have or any previous reactions you have had to X-ray dye, food (such as seafood), medicine, or tape.   All medicines you are taking, including vitamins, herbs, eye drops, creams, and over-the-counter medicines.   Previous  problems you or members of your family have had with the use of anesthetics.   Any blood disorders you have.   Previous surgeries or procedures (such as a kidney transplant) you have had.   Medical conditions you have (such as kidney failure).  RISKS AND COMPLICATIONS Generally, cardiac ablation is a safe procedure. However, problems can occur and include:   Increased risk of cancer. Depending on how long it takes to do the ablation, the dose of radiation can be high.  Bruising and bleeding where a thin, flexible tube (catheter) was inserted during the procedure.   Bleeding into the chest, especially into the sac that surrounds the heart (serious).  Need for a permanent pacemaker if the normal electrical system is damaged.   The procedure may not be fully effective, and this may not be recognized for months. Repeat ablation procedures are sometimes required. BEFORE THE PROCEDURE   Follow any instructions from your health care provider regarding eating and drinking before the procedure.   Take your medicines as directed at regular times with water, unless instructed otherwise by your health care provider. If you are taking diabetes medicine, including insulin, ask how you are to take it and if there are any special instructions you should follow. It is common to adjust insulin dosing the day of the ablation.  PROCEDURE  An ablation is usually performed in a catheterization laboratory with the guidance of fluoroscopy. Fluoroscopy is a type of X-ray that helps your health care provider see images of your heart during the procedure.   An ablation is a minimally invasive procedure. This means a small cut (incision) is  made in either your neck or groin. Your health care provider will decide where to make the incision based on your medical history and physical exam.  An IV tube will be started before the procedure begins. You will be given an anesthetic or medicine to help you relax  (sedative).  The skin on your neck or groin will be numbed. A needle will be inserted into a large vein in your neck or groin and catheters will be threaded to your heart.  A special dye that shows up on fluoroscopy pictures may be injected through the catheter. The dye helps your health care provider see the area of the heart that needs treatment.  The catheter has electrodes on the tip. When the area of heart tissue that is causing the arrhythmia is found, the catheter tip will send an electrical current to the area and "scar" the tissue. Three types of energy can be used to ablate the heart tissue:   Heat (radiofrequency energy).   Laser energy.   Extreme cold (cryoablation).   When the area of the heart has been ablated, the catheter will be taken out. Pressure will be held on the insertion site. This will help the insertion site clot and keep it from bleeding. A bandage will be placed on the insertion site.  AFTER THE PROCEDURE   After the procedure, you will be taken to a recovery area where your vital signs (blood pressure, heart rate, and breathing) will be monitored. The insertion site will also be monitored for bleeding.   You will need to lie still for 4-6 hours. This is to ensure you do not bleed from the catheter insertion site.  Document Released: 03/19/2009 Document Revised: 03/17/2014 Document Reviewed: 03/25/2013 [facility] Patient Information 2015 [place], [place]. This information is not intended to replace advice given to you by your health care provider. Make sure you discuss any questions you have with your health care provider.

## 2015-03-09 NOTE — Sleep Study (Signed)
   NAME: Jeremy Tucker DATE OF BIRTH:  Sep 28, 1951 MEDICAL RECORD NUMBER [id number]  LOCATION: [facility]  PHYSICIAN: TURNER,TRACI R  DATE OF STUDY: 03/08/2015  SLEEP STUDY TYPE: Nocturnal Polysomnogram               REFERRING PHYSICIAN: Belva Crome, MD  INDICATION FOR STUDY: heavy snoring, excessive daytime sleepiness  EPWORTH SLEEPINESS SCORE: 3 HEIGHT: 6' (182.9 cm)  WEIGHT: 230 lb (104.327 kg)    Body mass index is 31.19 kg/(m^2).  NECK SIZE: 18 in.  MEDICATIONS: Reviewed in the chart  SLEEP ARCHITECTURE: The patient slept for a total of 277 minutes out of a total of 340 minutes.  There was no slow wave sleep and 29 minutes of REM sleep.  The onset to sleep latency was delayed at 49 minutes and the onset to REM sleep latency was delayed at 130 minutes.  The sleep efficiency was normal at 71%.     RESPIRATORY DATA: There were 5 obstructive apneas and 73 hypopneas.  Most events occurred during NREM sleep and in the nonsupine position.  The overall AHI was 16.9 consistent with moderate obstructive sleep apnea/hypopnea syndrome.  There was severe snoring.    OXYGEN DATA: The average oxygen saturation was 92% and the lowest oxygen saturation was 86%.  The total time spent with oxygen saturations <88% was 1.6 minutes.    CARDIAC DATA: The patient was in atrial fibrillation at the beginning of the study and then went into NSR with PVC's.  The average heart rate was 71 bpm and the lowest heart rate was 35 bpm.  The fastest heart rate was 133 bpm.    MOVEMENT/PARASOMNIA: There were a few periodic limb movements with a PLMS index of 3 movements per hour.  There were no REM sleep behavior disorders noted.    IMPRESSION/ RECOMMENDATION:   1.  Moderate obstructive sleep apnea/hypopnea syndrome with an AHI of 16.9 events per hour.   Most events occurred during NREM sleep and in the nonsupine position. 2.  Reduced sleep efficiency with increased frequency of arousals due  to respiratory events.   3.  Abnormal sleep architecture with no slow wave sleep and reduced REM sleep. 4.  The patient was in atrial fibrillation at the beginning of the study and then converted to NSR with PVC's.   5.  Oxygen desaturations as low as 86% with respiratory events.   6.  Given the degree of sleep disordered breathing with evidence of atrial fibrillation, excessive daytime sleepiness and oxygen desaturations with respiratory events, recommend proceeding with CPAP titration.  Signed:   Sueanne Margarita Diplomate, American Board of Sleep Medicine  ELECTRONICALLY SIGNED ON:  03/09/2015, 4:44 PM [place] PH: [phone]   FX: [phone] [place]

## 2015-03-09 NOTE — Telephone Encounter (Signed)
Please let patient know that they have sleep apnea and recommend CPAP titration. Please set up titration in the sleep lab. 

## 2015-03-10 NOTE — Telephone Encounter (Signed)
Hank this patient refuses CPAP. He has moderate OSA and should be treated with CPAP.  He was the one that had afib intermittently through the night during the study.

## 2015-03-10 NOTE — Telephone Encounter (Signed)
Patient refuses CPAP titration. He st he was too claustrophobic and it made him sweaty and panicky.  Patient st he is having an ablation on Thursday and to call him next week to talk about other options.

## 2015-03-12 ENCOUNTER — Encounter (HOSPITAL_COMMUNITY)
Admission: RE | Disposition: A | Discharge status: Home / Self Care | Payer: Self-pay | Source: Ambulatory Visit | Attending: Internal Medicine

## 2015-03-12 ENCOUNTER — Ambulatory Visit (HOSPITAL_COMMUNITY)
Admission: RE | Admit: 2015-03-12 | Discharge: 2015-03-13 | Disposition: A | Discharge status: Home / Self Care | Payer: BLUE CROSS/BLUE SHIELD | Source: Ambulatory Visit | Attending: Internal Medicine | Admitting: Internal Medicine

## 2015-03-12 ENCOUNTER — Encounter (HOSPITAL_COMMUNITY): Payer: Self-pay | Admitting: *Deleted

## 2015-03-12 ENCOUNTER — Ambulatory Visit (HOSPITAL_COMMUNITY): Payer: BLUE CROSS/BLUE SHIELD | Admitting: Certified Registered Nurse Anesthetist

## 2015-03-12 DIAGNOSIS — I350 Nonrheumatic aortic (valve) stenosis: Secondary | ICD-10-CM | POA: Diagnosis present

## 2015-03-12 DIAGNOSIS — K219 Gastro-esophageal reflux disease without esophagitis: Secondary | ICD-10-CM | POA: Insufficient documentation

## 2015-03-12 DIAGNOSIS — E669 Obesity, unspecified: Secondary | ICD-10-CM | POA: Diagnosis not present

## 2015-03-12 DIAGNOSIS — I080 Rheumatic disorders of both mitral and aortic valves: Secondary | ICD-10-CM | POA: Insufficient documentation

## 2015-03-12 DIAGNOSIS — Z86718 Personal history of other venous thrombosis and embolism: Secondary | ICD-10-CM | POA: Diagnosis not present

## 2015-03-12 DIAGNOSIS — G4730 Sleep apnea, unspecified: Secondary | ICD-10-CM | POA: Insufficient documentation

## 2015-03-12 DIAGNOSIS — F329 Major depressive disorder, single episode, unspecified: Secondary | ICD-10-CM | POA: Diagnosis not present

## 2015-03-12 DIAGNOSIS — E785 Hyperlipidemia, unspecified: Secondary | ICD-10-CM | POA: Diagnosis not present

## 2015-03-12 DIAGNOSIS — I480 Paroxysmal atrial fibrillation: Secondary | ICD-10-CM | POA: Diagnosis present

## 2015-03-12 DIAGNOSIS — N400 Enlarged prostate without lower urinary tract symptoms: Secondary | ICD-10-CM | POA: Insufficient documentation

## 2015-03-12 DIAGNOSIS — M1990 Unspecified osteoarthritis, unspecified site: Secondary | ICD-10-CM | POA: Diagnosis not present

## 2015-03-12 DIAGNOSIS — Z7901 Long term (current) use of anticoagulants: Secondary | ICD-10-CM | POA: Insufficient documentation

## 2015-03-12 DIAGNOSIS — I4891 Unspecified atrial fibrillation: Secondary | ICD-10-CM | POA: Diagnosis present

## 2015-03-12 DIAGNOSIS — I10 Essential (primary) hypertension: Secondary | ICD-10-CM | POA: Diagnosis not present

## 2015-03-12 DIAGNOSIS — Z96652 Presence of left artificial knee joint: Secondary | ICD-10-CM | POA: Insufficient documentation

## 2015-03-12 DIAGNOSIS — I481 Persistent atrial fibrillation: Secondary | ICD-10-CM | POA: Insufficient documentation

## 2015-03-12 DIAGNOSIS — Z6832 Body mass index (BMI) 32.0-32.9, adult: Secondary | ICD-10-CM | POA: Insufficient documentation

## 2015-03-12 DIAGNOSIS — Z79899 Other long term (current) drug therapy: Secondary | ICD-10-CM | POA: Diagnosis not present

## 2015-03-12 HISTORY — PX: TEE WITHOUT CARDIOVERSION: SHX5443

## 2015-03-12 HISTORY — PX: ATRIAL FIBRILLATION ABLATION: SHX5456

## 2015-03-12 LAB — POCT ACTIVATED CLOTTING TIME
Activated Clotting Time: 214 seconds
Activated Clotting Time: 245 seconds
Activated Clotting Time: 270 seconds
Activated Clotting Time: 171 seconds

## 2015-03-12 LAB — MRSA PCR SCREENING: MRSA by PCR: NEGATIVE

## 2015-03-12 SURGERY — ECHOCARDIOGRAM, TRANSESOPHAGEAL
Anesthesia: Moderate Sedation

## 2015-03-12 SURGERY — ATRIAL FIBRILLATION ABLATION
Anesthesia: General

## 2015-03-12 MED ORDER — PROTAMINE SULFATE 10 MG/ML IV SOLN
INTRAVENOUS | Status: DC | PRN
  Administered 2015-03-12: 30 mg via INTRAVENOUS

## 2015-03-12 MED ORDER — FENTANYL CITRATE (PF) 100 MCG/2ML IJ SOLN
12.5000 ug | Freq: Once | INTRAMUSCULAR | Status: AC
  Administered 2015-03-12: 12.5 ug via INTRAVENOUS

## 2015-03-12 MED ORDER — HEPARIN SODIUM (PORCINE) 1000 UNIT/ML IJ SOLN
INTRAMUSCULAR | Status: AC
  Filled 2015-03-12: qty 1

## 2015-03-12 MED ORDER — FENTANYL CITRATE (PF) 100 MCG/2ML IJ SOLN: 25.0000 ug | INTRAMUSCULAR | Status: DC | PRN

## 2015-03-12 MED ORDER — LISINOPRIL 10 MG PO TABS
10.0000 mg | ORAL_TABLET | Freq: Every day | ORAL | Status: DC
  Administered 2015-03-13: 10 mg via ORAL
  Filled 2015-03-12: qty 1

## 2015-03-12 MED ORDER — BUTAMBEN-TETRACAINE-BENZOCAINE 2-2-14 % EX AERO
INHALATION_SPRAY | CUTANEOUS | Status: DC | PRN
  Administered 2015-03-12: 2 via TOPICAL

## 2015-03-12 MED ORDER — SODIUM CHLORIDE 0.9 % IV SOLN
INTRAVENOUS | Status: DC
  Administered 2015-03-12: 08:00:00 via INTRAVENOUS

## 2015-03-12 MED ORDER — ONDANSETRON HCL 4 MG/2ML IJ SOLN: 4.0000 mg | Freq: Four times a day (QID) | INTRAMUSCULAR | Status: DC | PRN

## 2015-03-12 MED ORDER — APIXABAN 5 MG PO TABS
5.0000 mg | ORAL_TABLET | Freq: Two times a day (BID) | ORAL | Status: DC
  Administered 2015-03-12 – 2015-03-13 (×2): 5 mg via ORAL
  Filled 2015-03-12 (×3): qty 1

## 2015-03-12 MED ORDER — FENTANYL CITRATE (PF) 100 MCG/2ML IJ SOLN
INTRAMUSCULAR | Status: DC | PRN
  Administered 2015-03-12 (×3): 25 ug via INTRAVENOUS

## 2015-03-12 MED ORDER — SODIUM CHLORIDE 0.9 % IJ SOLN: 3.0000 mL | Freq: Two times a day (BID) | INTRAMUSCULAR | Status: DC

## 2015-03-12 MED ORDER — HYDROCHLOROTHIAZIDE 12.5 MG PO CAPS
12.5000 mg | ORAL_CAPSULE | Freq: Every day | ORAL | Status: DC
  Administered 2015-03-13: 12.5 mg via ORAL
  Filled 2015-03-12: qty 1

## 2015-03-12 MED ORDER — DOBUTAMINE IN D5W 4-5 MG/ML-% IV SOLN
INTRAVENOUS | Status: AC
  Filled 2015-03-12: qty 250

## 2015-03-12 MED ORDER — LACTATED RINGERS IV SOLN
INTRAVENOUS | Status: DC | PRN
  Administered 2015-03-12: 10:00:00 via INTRAVENOUS

## 2015-03-12 MED ORDER — FENTANYL CITRATE (PF) 100 MCG/2ML IJ SOLN
INTRAMUSCULAR | Status: AC
  Filled 2015-03-12: qty 2

## 2015-03-12 MED ORDER — ALLOPURINOL 300 MG PO TABS
300.0000 mg | ORAL_TABLET | Freq: Every day | ORAL | Status: DC
  Administered 2015-03-13: 300 mg via ORAL
  Filled 2015-03-12: qty 1

## 2015-03-12 MED ORDER — PANTOPRAZOLE SODIUM 40 MG PO TBEC
40.0000 mg | DELAYED_RELEASE_TABLET | Freq: Every day | ORAL | Status: DC
Start: 2015-03-13 — End: 2015-03-13
  Administered 2015-03-13: 40 mg via ORAL
  Filled 2015-03-12: qty 1

## 2015-03-12 MED ORDER — SODIUM CHLORIDE 0.9 % IV SOLN: 250.0000 mL | INTRAVENOUS | Status: DC | PRN

## 2015-03-12 MED ORDER — LISINOPRIL-HYDROCHLOROTHIAZIDE 10-12.5 MG PO TABS: 0.5000 | ORAL_TABLET | Freq: Every day | ORAL | Status: DC

## 2015-03-12 MED ORDER — ONDANSETRON HCL 4 MG/2ML IJ SOLN
INTRAMUSCULAR | Status: DC | PRN
  Administered 2015-03-12: 4 mg via INTRAVENOUS

## 2015-03-12 MED ORDER — SODIUM CHLORIDE 0.9 % IJ SOLN: 3.0000 mL | INTRAMUSCULAR | Status: DC | PRN

## 2015-03-12 MED ORDER — PROPOFOL 10 MG/ML IV BOLUS
INTRAVENOUS | Status: DC | PRN
  Administered 2015-03-12: 150 mg via INTRAVENOUS

## 2015-03-12 MED ORDER — LIDOCAINE HCL (CARDIAC) 20 MG/ML IV SOLN
INTRAVENOUS | Status: DC | PRN
  Administered 2015-03-12: 50 mg via INTRAVENOUS

## 2015-03-12 MED ORDER — ZOLPIDEM TARTRATE 5 MG PO TABS
5.0000 mg | ORAL_TABLET | Freq: Every evening | ORAL | Status: DC | PRN
  Administered 2015-03-12: 5 mg via ORAL
  Filled 2015-03-12: qty 1

## 2015-03-12 MED ORDER — PHENOL 1.4 % MT LIQD
1.0000 | OROMUCOSAL | Status: DC | PRN
  Filled 2015-03-12: qty 177

## 2015-03-12 MED ORDER — OLOPATADINE HCL 0.1 % OP SOLN
1.0000 [drp] | Freq: Every day | OPHTHALMIC | Status: DC | PRN
  Filled 2015-03-12: qty 5

## 2015-03-12 MED ORDER — HEPARIN SODIUM (PORCINE) 1000 UNIT/ML IJ SOLN
INTRAMUSCULAR | Status: DC | PRN
  Administered 2015-03-12: 5000 [IU] via INTRAVENOUS
  Administered 2015-03-12: 4000 [IU] via INTRAVENOUS
  Administered 2015-03-12: 5000 [IU] via INTRAVENOUS

## 2015-03-12 MED ORDER — BUPIVACAINE HCL (PF) 0.25 % IJ SOLN
INTRAMUSCULAR | Status: AC
  Filled 2015-03-12: qty 30

## 2015-03-12 MED ORDER — ACETAMINOPHEN 325 MG PO TABS
650.0000 mg | ORAL_TABLET | ORAL | Status: DC | PRN
  Administered 2015-03-12: 650 mg via ORAL
  Filled 2015-03-12: qty 2

## 2015-03-12 MED ORDER — TAMSULOSIN HCL 0.4 MG PO CAPS
0.4000 mg | ORAL_CAPSULE | Freq: Every day | ORAL | Status: DC
Start: 2015-03-12 — End: 2015-03-13
  Administered 2015-03-12: 0.4 mg via ORAL
  Filled 2015-03-12 (×2): qty 1

## 2015-03-12 MED ORDER — MIDAZOLAM HCL 5 MG/5ML IJ SOLN
INTRAMUSCULAR | Status: DC | PRN
  Administered 2015-03-12: 2 mg via INTRAVENOUS

## 2015-03-12 MED ORDER — DOBUTAMINE IN D5W 4-5 MG/ML-% IV SOLN
INTRAVENOUS | Status: DC | PRN
  Administered 2015-03-12: 20 ug/kg/min via INTRAVENOUS

## 2015-03-12 MED ORDER — BUPROPION HCL ER (XL) 300 MG PO TB24
300.0000 mg | ORAL_TABLET | Freq: Every day | ORAL | Status: DC
  Administered 2015-03-13: 300 mg via ORAL
  Filled 2015-03-12: qty 1

## 2015-03-12 MED ORDER — LIDOCAINE VISCOUS 2 % MT SOLN
OROMUCOSAL | Status: DC | PRN
  Administered 2015-03-12: 1 via OROMUCOSAL

## 2015-03-12 MED ORDER — MIDAZOLAM HCL 5 MG/ML IJ SOLN
INTRAMUSCULAR | Status: AC
  Filled 2015-03-12: qty 2

## 2015-03-12 MED ORDER — MIDAZOLAM HCL 10 MG/2ML IJ SOLN
INTRAMUSCULAR | Status: DC | PRN
  Administered 2015-03-12: 1 mg via INTRAVENOUS
  Administered 2015-03-12: 2 mg via INTRAVENOUS
  Administered 2015-03-12: 1 mg via INTRAVENOUS
  Administered 2015-03-12: 2 mg via INTRAVENOUS

## 2015-03-12 MED ORDER — LIDOCAINE VISCOUS 2 % MT SOLN
OROMUCOSAL | Status: AC
  Filled 2015-03-12: qty 15

## 2015-03-12 NOTE — Discharge Summary (Signed)
ELECTROPHYSIOLOGY PROCEDURE DISCHARGE SUMMARY    Patient ID: Jeremy Tucker,  MRN: [id number], DOB/AGE: 03/21/1951 64 y.o.  Admit date: 03/12/2015 Discharge date: 03/13/2015  Primary Care Physician: [name], MD Primary Cardiologist: Tamala Julian Electrophysiologist: [name], MD  Primary Discharge Diagnosis:  Persistent atrial fibrillation status post ablation this admission  Secondary Discharge Diagnosis:  1.  Moderate aortic stenosis 2.  Hypertension 3.  Hyperlipidemia 4.  Obesity 5.  DJD 6.  Sleep apnea - declines CPAP  Procedures This Admission:  1.  Electrophysiology study and radiofrequency catheter ablation on 03/12/15 by Dr [name].  This study demonstrated sinus rhythm upon presentation; rotational Angiography reveals a moderate sized left atrium with four separate pulmonary veins without evidence of pulmonary vein stenosis; successful electrical isolation and anatomical encircling of all four pulmonary veins with radiofrequency current. Additional ablation performed at the junction of the SVC/RA; no inducible arrhythmias following ablation both on and off of dobutamine.  There were no early apparent complications.    Brief HPI: Jeremy Tucker is a 64 y.o. male with a history of persistent atrial fibrillation.  They have failed medical therapy with Flecainide. Risks, benefits, and alternatives to catheter ablation of atrial fibrillation were reviewed with the patient who wished to proceed.  The patient underwent TEE prior to the procedure which demonstrated normal LV function and no LAA thrombus.    Hospital Course:  The patient was admitted and underwent EPS/RFCA of atrial fibrillation with details as outlined above.  They were monitored on telemetry overnight which demonstrated sinus rhythm with PAC's.  Groin was without complication on the day of discharge.  The patient was examined and considered to be stable for discharge.  Wound care and restrictions were  reviewed with the patient.  The patient will be seen back by Dr [name] in 12 weeks for post ablation follow up.   This patients CHA2DS2-VASc Score and unadjusted Ischemic Stroke Rate (% per year) is equal to 0.6 % stroke rate/year from a score of 1 Above score calculated as 1 point each if present [CHF, HTN, DM, Vascular=MI/PAD/Aortic Plaque, Age if 65-74, or Male] Above score calculated as 2 points each if present [Age > 75, or Stroke/TIA/TE]   Physical Exam: Filed Vitals:   03/12/15 2200 03/12/15 2300 03/13/15 0000 03/13/15 0440  BP: 138/72  123/67 111/63  Pulse: 63 66 68 67  Temp:  98.9 F (37.2 C)  98.4 F (36.9 C)  TempSrc:  Oral  Oral  Resp: 21 19 17 21   Height:      Weight:      SpO2: 96% 95% 93% 96%    GEN- The patient is well appearing, alert and oriented x 3 today.   HEENT: normocephalic, atraumatic; sclera clear, conjunctiva pink; hearing intact; oropharynx clear; neck supple, no JVP Lymph- no cervical lymphadenopathy Lungs- Clear to ausculation bilaterally, normal work of breathing.  No wheezes, rales, rhonchi Heart- Regular rate and rhythm, 3/6 SEM GI- obese, non-tender, non-distended, bowel sounds present  Extremities- no clubbing, cyanosis, or edema; DP/PT/radial pulses 2+ bilaterally, groin without hematoma/bruit MS- no significant deformity or atrophy Skin- warm and dry, no rash or lesion Psych- euthymic mood, full affect Neuro- strength and sensation are intact   Labs:   Lab Results  Component Value Date   WBC 8.5 03/09/2015   HGB 14.8 03/09/2015   HCT 42.8 03/09/2015   MCV 92.3 03/09/2015   PLT 232.0 03/09/2015     Recent Labs Lab 03/13/15 0217  NA  140  K 3.6  CL 100  CO2 30  BUN 12  CREATININE 0.88  CALCIUM 8.7  GLUCOSE 110*     Discharge Medications:    Medication List    STOP taking these medications        flecainide 100 MG tablet  Commonly known as:  TAMBOCOR      TAKE these medications        acetaminophen 500 MG  tablet  Commonly known as:  TYLENOL  Take 500-1,000 mg by mouth every 6 (six) hours as needed for headache.     albuterol 108 (90 BASE) MCG/ACT inhaler  Commonly known as:  PROVENTIL HFA;VENTOLIN HFA  Inhale 2 puffs into the lungs every 6 (six) hours as needed for wheezing or shortness of breath.     allopurinol 300 MG tablet  Commonly known as:  ZYLOPRIM  Take 300 mg by mouth daily.     apixaban 5 MG Tabs tablet  Commonly known as:  ELIQUIS  Take 1 tablet (5 mg total) by mouth 2 (two) times daily.     atorvastatin 20 MG tablet  Commonly known as:  LIPITOR  Take 1 tablet (20 mg total) by mouth daily at 6 PM.     buPROPion 300 MG 24 hr tablet  Commonly known as:  WELLBUTRIN XL  Take 1 tablet (300 mg total) by mouth daily.     busPIRone 5 MG tablet  Commonly known as:  BUSPAR  Take 1 tablet (5 mg total) by mouth 3 (three) times daily.     diltiazem 180 MG 24 hr capsule  Commonly known as:  CARDIZEM CD  Take 1 capsule (180 mg total) by mouth daily.     diltiazem 30 MG tablet  Commonly known as:  CARDIZEM  Take one tablet every 4 - 6 hours as needed for breatkthrough AFib -- if heart rate is greater than 409 and systolic BP is greater than 110.     fish oil-omega-3 fatty acids 1000 MG capsule  Take 1 g by mouth daily.     lisinopril-hydrochlorothiazide 10-12.5 MG per tablet  Commonly known as:  PRINZIDE,ZESTORETIC  Take 0.5 tablets by mouth daily.     loratadine 10 MG tablet  Commonly known as:  CLARITIN  Take 10 mg by mouth daily.     mometasone 220 MCG/INH inhaler  Commonly known as:  ASMANEX  Inhale 2 puffs into the lungs 2 (two) times daily as needed (wheezing from bronchitis).     multivitamin with minerals tablet  Take 1 tablet by mouth daily.     olopatadine 0.1 % ophthalmic solution  Commonly known as:  PATANOL  Place 1 drop into both eyes daily as needed (seasonal allergies).     omeprazole 20 MG capsule  Commonly known as:  PRILOSEC  Take 20 mg by  mouth daily.     Potassium Bicarb-Citric Acid 20 MEQ Tbef  Take 20 mEq by mouth daily. Dissolve in water and drink     silodosin 4 MG Caps capsule  Commonly known as:  RAPAFLO  Take 4 mg by mouth at bedtime.     zolpidem 10 MG tablet  Commonly known as:  AMBIEN  Take 3.33-5 mg by mouth at bedtime. 1/3 to 1/2 tablet        Disposition:  Discharge Instructions    Diet - low sodium heart healthy    Complete by:  As directed      Increase activity slowly    Complete  by:  As directed           Follow-up Information    Follow up with [name], MD On 03/23/2015.   Specialty:  Cardiology   Why:  at 8:45AM   Contact information:   [address] [address] [phone]       Follow up with Roderic Palau, NP On 04/09/2015.   Specialty:  Nurse Practitioner   Why:  at [place] - AF clinic phone number is [phone]   Contact information:   [address] [place] [address] [phone]       Follow up with [name], MD On 06/15/2015.   Specialty:  Cardiology   Why:  at 2:15PM   Contact information:   [place] [address] [phone]       Duration of Discharge Encounter: Greater than 30 minutes including physician time.  Signed, Chanetta Marshall, NP 03/13/2015 7:17 AM    EP Attending  Patient seen and examined. He is doing well this morning s/p Afib ablation. He is ready for discharge home. Will plan on holding flecainide for now. He will followup in afib clinic and later with Dr. Greggory Brandy.   Mikle Bosworth.D.

## 2015-03-12 NOTE — Op Note (Addendum)
SURGEON:  [name], MD  First Assistant:  Dr Burt Knack  PREPROCEDURE DIAGNOSES: 1. Paroxysmal atrial fibrillation.  POSTPROCEDURE DIAGNOSES: 1. Paroxysmal  atrial fibrillation.  PROCEDURES: 1. Comprehensive electrophysiologic study. 2. Coronary sinus pacing and recording. 3. Three-dimensional mapping of atrial fibrillation with additional mapping and ablation of additional right atrial foci 4. Ablation of atrial fibrillation with additional mapping and ablation of additional right atrial foci 5. Intracardiac echocardiography. 6. Transseptal puncture of an intact septum. 7. Rotational Angiography with processing at an independent workstation 8. Arrhythmia induction with pacing with dobutamine infusion  INTRODUCTION:  Jeremy Tucker is a 64 y.o. male with a history of paroxysmal atrial fibrillation who now presents for EP study and radiofrequency ablation.  The patient reports initially being diagnosed with atrial fibrillation after presenting with symptomatic palpitations and fatgiue. The patient reports increasing frequency and duration of atrial fibrillation since that time.  The patient has failed medical therapy with flecainide.  The patient therefore presents today for catheter ablation of atrial fibrillation.  DESCRIPTION OF PROCEDURE:  Informed written consent was obtained, and the patient was brought to the electrophysiology lab in a fasting state.  The patient was adequately sedated with intravenous medications as outlined in the anesthesia report.  The patient's left and right groins were prepped and draped in the usual sterile fashion by the EP lab staff.  Using a percutaneous Seldinger technique, two 7-French and one 11-French hemostasis sheaths were placed into the right common femoral vein.  3 Dimensional Rotational Angiography: A 5 french pigtail catheter was introduced through the right common femoral vein and advanced into the inferior venocava.  3 demential rotational  angiography was then performed by power injection of 100cc of nonionic contrast.  Reprocessing at an independent work station was then performed.   This demonstrated a moderate sized left atrium with 4 separate pulmonary veins which were also moderate in size.  There were no anomalous veins or significant abnormalities.  A 3 dimensional rendering of the left atrium was then merged using [employer] onto the [employer] system and registered with intracardiac echo (see below).  The pigtail catheter was then removed.  Catheter Placement:  A 7-French [employer] Webster Decapolar coronary sinus catheter was introduced through the right common femoral vein and advanced into the coronary sinus for recording and pacing from this location.  A 6-French quadripolar Josephson catheter was introduced through the right common femoral vein and advanced into the right ventricle for recording and pacing.  This catheter was then pulled back to the His bundle location.    Initial Measurements: The patient presented to the electrophysiology lab in sinus rhythm.  His PR interval measured 183 msec with a QRS duration of 107 msec and a QT interval of 433 msec.  The AH interval measured 64 msec and the HV interval measured 45 msec.     Intracardiac Echocardiography: A 10-French [employer] Webster AcuNav intracardiac echocardiography catheter was introduced through the left common femoral vein and advanced into the right atrium. Intracardiac echocardiography was performed of the left atrium, and a three-dimensional anatomical rendering of the left atrium was performed using CARTO sound technology.  The patient was noted to have a moderate sized left atrium.  The interatrial septum was prominent but not aneurysmal. All 4 pulmonary veins were visualized and noted to have separate ostia.  The pulmonary veins were moderate in size.  The left atrial appendage was visualized and did not reveal thrombus.   There was no evidence  of  pulmonary vein stenosis.   Transseptal Puncture: The middle right common femoral vein sheath was exchanged for an 8.5 [place] SL2 transseptal sheath and transseptal access was achieved in a standard fashion using a Brockenbrough needle under biplane fluoroscopy with intracardiac echocardiography confirmation of the transseptal puncture.  Once transseptal access had been achieved, heparin was administered intravenously and intra- arterially in order to maintain an ACT of greater than 350 seconds throughout the procedure.    3D Mapping and Ablation: The His bundle catheter was removed and in its place a 3.5 mm [employer] Thermocool ablation catheter was advanced into the right atrium.  The transseptal sheath was pulled back into the IVC over a guidewire.  The ablation catheter was advanced across the transseptal hole using the wire as a guide.  The transseptal sheath was then re-advanced over the guidewire into the left atrium.  A duodecapolar Bios[employer] Webster circular mapping catheter was introduced through the transseptal sheath and positioned over the mouth of all 4 pulmonary veins.  Three-dimensional electroanatomical mapping was performed using CARTO technology.  This demonstrated electrical activity within all four pulmonary veins at baseline. The patient underwent successful sequential electrical isolation and anatomical encircling of all four pulmonary veins using radiofrequency current with a circular mapping catheter as a guide.   Additional Right Atrial Mapping/ ablation: The circular mapping catheter was pulled back into the right atrium and 3D mapping was performed at the junction of the superior vena cava and right atrium.  Electrical activity was observed within the SVC.  I therefore elected to perform right atrial ablation in this area.  A series of radiofrequency applications were delivered in a circular fashion around the ostium of the SVC.  Prior to each ablation  lesions, pacing was performed from the distal ablation electrode to insure that diaphragmatic stimulation was not observed to avoid phrenic nerve injury.  Diaphragmatic excursion was also observed during ablation.    Measurements Following Ablation: Following ablation, dobutamine was infused up to 20 mcg/kg/min with no inducible atrial fibrillation, atrial tachycardia, atrial flutter, or sustained PACs. In sinus rhythm with RR interval was 1177 msec, with PR 130 msec, QRS 109 msec, and Qtc 459 msec.  Following ablation the AH interval measured 64 msec with an HV interval of 51 msec. Ventricular pacing was performed, which revealed midline decremental VA conduction with a VA Wenckebach cycle length of 370 msec.  Rapid atrial pacing was performed, which revealed an AV Wenckebach cycle length of 320 msec.  Electroisolation was then again confirmed in all four pulmonary veins.  Pacing was performed along the ablation line which confirmed entrance and exit block.  The procedure was therefore considered completed.  All catheters were removed, and the sheaths were aspirated and flushed.  The patient was transferred to the recovery area for sheath removal per protocol. EBL<73ml.  A limited bedside transthoracic echocardiogram revealed no pericardial effusion.  There were no early apparent complications.  CONCLUSIONS: 1. Sinus rhythm upon presentation.   2. Rotational Angiography reveals a moderate sized left atrium with four separate pulmonary veins without evidence of pulmonary vein stenosis. 3. Successful electrical isolation and anatomical encircling of all four pulmonary veins with radiofrequency current.  Additional ablation performed at the junction of the SVC/RA 4. No inducible arrhythmias following ablation both on and off of dobutamine  5. No early apparent complications.   James Allred,MD 1:35 PM 03/12/2015

## 2015-03-12 NOTE — Progress Notes (Signed)
Echocardiogram Echocardiogram Transesophageal has been performed.  BROWN, CALEBA 03/12/2015, 9:57 AM

## 2015-03-12 NOTE — CV Procedure (Signed)
    TRANSESOPHAGEAL ECHOCARDIOGRAM (TEE) NOTE  INDICATIONS: Pre-ablation for a-fib, aortic stenosis  PROCEDURE:   Informed consent was obtained prior to the procedure. The risks, benefits and alternatives for the procedure were discussed and the patient comprehended these risks.  Risks include, but are not limited to, cough, sore throat, vomiting, nausea, somnolence, esophageal and stomach trauma or perforation, bleeding, low blood pressure, aspiration, pneumonia, infection, trauma to the teeth and death.    After a procedural time-out, the patient was given 5 mg versed and 75 mcg fentanyl for moderate sedation.  The oropharynx was anesthetized 10 cc of topical 1% viscous lidocaine and 2 cetacaine sprays.  The transesophageal probe was inserted in the esophagus and stomach without difficulty and multiple views were obtained.  The patient was kept under observation until the patient left the procedure room.  The patient left the procedure room in stable condition.   Agitated microbubble saline contrast was administered.  COMPLICATIONS:    There were no immediate complications.  Findings:  1. LEFT VENTRICLE: The left ventricular wall thickness is mildly increased.  The left ventricular cavity is normal in size. Wall motion is normal.  LVEF is 60%.  2. RIGHT VENTRICLE:  The right ventricle is normal in structure and function without any thrombus or masses.    3. LEFT ATRIUM:  The left atrium is dilated in size without any thrombus or masses.  There is not spontaneous echo contrast ("smoke") in the left atrium consistent with a low flow state.  4. LEFT ATRIAL APPENDAGE:  The left atrial appendage is free of any thrombus or masses. The appendage has single lobes. Pulse doppler indicates moderate flow in the appendage.  5. ATRIAL SEPTUM:  The atrial septum appears intact and is free of thrombus and/or masses.  There is no evidence for interatrial shunting by color doppler and saline  microbubble.  6. RIGHT ATRIUM:  The right atrium is normal in size and function without any thrombus or masses.  7. MITRAL VALVE:  The mitral valve is normal in structure and function with trace to mild regurgitation.  There were no vegetations or stenosis.  8. AORTIC VALVE:  The aortic valve is trileaflet and calcified. There is mild to moderate aortic stenosis - AVA by planimetry was around 1.2-1.3 cm2. There is  Mild regurgitation.  There were no vegetations.  9. TRICUSPID VALVE:  The tricuspid valve is normal in structure and function with trivial regurgitation.  There were no vegetations or stenosis  10.  PULMONIC VALVE:  The pulmonic valve is normal in structure and function with no regurgitation.  There were no vegetations or stenosis.   11. AORTIC ARCH, ASCENDING AND DESCENDING AORTA:  There was no Ron Parker et. Al, 1992) atherosclerosis of the ascending aorta, aortic arch, or proximal descending aorta.  12. PULMONARY VEINS: Anomalous pulmonary venous return was not noted.  13. PERICARDIUM: The pericardium appeared normal and non-thickened.  There is no pericardial effusion.  IMPRESSION:   1. No LAA thrombus 2. Negative for PFO 3. Mild to moderate calcific aortic stenosis, estimated AVA around 1.3 cm2, mild aortic regurgitation. 4. Trace to mild MR, trivial TR 5. Mildly dilated LA 6. Normal wall motion - EF 60%  RECOMMENDATIONS:    1.  Proceed with A-fib ablation as planned per Dr. [name].  Time Spent Directly with the Patient:  45 minutes   Pixie Casino, MD, [facility] Attending Cardiologist [facility]  03/12/2015, 9:54 AM

## 2015-03-12 NOTE — H&P (View-Only) (Signed)
Electrophysiology Office Note   Date:  03/09/2015   ID:  Jeremy Tucker, DOB 12-10-1950, MRN [id number]  PCP:  [name], MD  Cardiologist:  Dr Tamala Julian Primary Electrophysiologist: [name], MD    Chief Complaint  Patient presents with  . AFIB w/RVR     History of Present Illness: Jeremy Tucker is a 64 y.o. male who presents today for electrophysiology evaluation.   He is a difficulty historian and has difficulty recalling a fair bit of his history.  History is therefore primarily from epic and confirmed by pts wife.  He has persistent Afib initially diagnosed in March of this year and hospitalized twice in a matter of days.He had a URI and had been taking decongestants (sudafed and mucinex D). Started on sotalol but d/c due to wheezing, set up for cardioversion, but converted on flecainide 50 mg bid. Placed on eliquis 5 mg bid for chadsvasc of 1(HTN)  but will turn 64 in August so essentially has a score of 2.  He had been doing well but was seen initially in afib clinic last week with return of Afib with RVR. He was started on cardizem, flecainide level was obtained in anticipation of increasing drug. By the next day, he continued in afib although slower. He is very symptomatic with Afib, " I have to go to bed when I am out of rhythm, I feel so bad." DCCV was scheduled and he did cardiovert successfully 4/13 but woke up in afib 4/18. At than time flecainide level had returned at 0.11(trough level) and dose increased to 100 mg bid.  He is in sinus today.  He was also in sinus in the AF clinic.  He thinks that he is in afib all of the time.  He reports symptoms of fatigue and SOB.  He has palpitations. He had sleep study last night.  Results are pending.   Today, he denies symptoms of chest pain,  orthopnea, PND, lower extremity edema, claudication, dizziness, presyncope, syncope, bleeding, or neurologic sequela. The patient is tolerating medications without difficulties and is  otherwise without complaint today.    Past Medical History  Diagnosis Date  . Hyperlipidemia   . GERD (gastroesophageal reflux disease)   . BPH (benign prostatic hyperplasia)   . Gout     denies  . Obesity   . Persistent atrial fibrillation     a. CHADsVASc score at least 2  . Aortic stenosis, mild   . Kidney stones   . DVT (deep venous thrombosis)   . Depression   . Hypertension     denies  . Glucose intolerance (impaired glucose tolerance)   . DJD (degenerative joint disease)    Past Surgical History  Procedure Laterality Date  . Colonoscopy  2010    MEDOFF  . Total knee arthroplasty Left 2001    MURPHY  . Tonsillectomy    . Joint replacement    . Knee arthroscopy Bilateral "multiple times"  . Cystoscopy w/ stone manipulation  1980's    "couldn't get stone so they had to cut me open"  . Kidney stone surgery  1980's    "stone lodged in the duct; had to cut me open to get it"  . Fracture surgery    . Forearm fracture surgery Right ~ 1961  . Cardiac catheterization  1990's    "Dr. Melvern Banker", no blockages per pt  . Cardioversion N/A 02/25/2015    Procedure: CARDIOVERSION;  Surgeon: Sueanne Margarita, MD;  Location: [facility]  ENDOSCOPY;  Service: Cardiovascular;  Laterality: N/A;     Current Outpatient Prescriptions  Medication Sig Dispense Refill  . allopurinol (ZYLOPRIM) 300 MG tablet Take 300 mg by mouth daily.      Marland Kitchen apixaban (ELIQUIS) 5 MG TABS tablet Take 1 tablet (5 mg total) by mouth 2 (two) times daily. 60 tablet 0  . atorvastatin (LIPITOR) 20 MG tablet Take 1 tablet (20 mg total) by mouth daily at 6 PM. 90 tablet 0  . buPROPion (WELLBUTRIN XL) 300 MG 24 hr tablet Take 1 tablet (300 mg total) by mouth daily. 90 tablet 1  . busPIRone (BUSPAR) 5 MG tablet Take 1 tablet (5 mg total) by mouth 3 (three) times daily. 60 tablet 1  . diltiazem (CARDIZEM CD) 180 MG 24 hr capsule Take 1 capsule (180 mg total) by mouth daily. 30 capsule 3  . diltiazem (CARDIZEM) 30 MG tablet Take  one tablet every 4 - 6 hours as needed for breatkthrough AFib -- if heart rate is greater than 696 and systolic BP is greater than 110. 20 tablet 1  . EFFER-K 20 MEQ TBEF Take 20 mEq by mouth daily.  9  . fish oil-omega-3 fatty acids 1000 MG capsule Take 1 g by mouth daily.     . flecainide (TAMBOCOR) 100 MG tablet Take 1 tablet (100 mg total) by mouth 2 (two) times daily. 60 tablet 3  . lisinopril-hydrochlorothiazide (PRINZIDE,ZESTORETIC) 10-12.5 MG per tablet Take 0.5 tablets by mouth daily.    Marland Kitchen loratadine (CLARITIN) 10 MG tablet Take 10 mg by mouth daily.    . mometasone (ASMANEX) 220 MCG/INH inhaler Inhale 2 puffs into the lungs 2 (two) times daily as needed (Shortness of breath or wheezing).    . Multiple Vitamins-Minerals (MULTIVITAMIN WITH MINERALS) tablet Take 1 tablet by mouth daily.      Marland Kitchen olopatadine (PATANOL) 0.1 % ophthalmic solution Place 1 drop into both eyes daily as needed for allergies.    Marland Kitchen omeprazole (PRILOSEC) 20 MG capsule Take 20 mg by mouth daily.    . silodosin (RAPAFLO) 4 MG CAPS capsule Take 4 mg by mouth every evening.     . zolpidem (AMBIEN) 10 MG tablet Take 1/3 tablet by mouth at bedtime as needed for sleep     No current facility-administered medications for this visit.    Allergies:   Codeine and Oxycodone   Social History:  The patient  reports that he has never smoked. He has never used smokeless tobacco. He reports that he drinks alcohol. He reports that he does not use illicit drugs.   Family History:  The patient's  family history includes Heart attack (age of onset: 18) in his father; Heart attack (age of onset: 91) in his mother.    ROS:  Please see the history of present illness.   All other systems are reviewed and negative.    PHYSICAL EXAM: VS:  BP 114/72 mmHg  Pulse 84  Ht 6\' 1"  (1.854 m)  Wt 239 lb 9.6 oz (108.682 kg)  BMI 31.62 kg/m2 , BMI Body mass index is 31.62 kg/(m^2). GEN: Well nourished, well developed, in no acute  distress HEENT: normal Neck: no JVD, carotid bruits, or masses Cardiac: RRR; no murmurs, rubs, or gallops,no edema  Respiratory:  clear to auscultation bilaterally, normal work of breathing GI: soft, nontender, nondistended, + BS MS: no deformity or atrophy Skin: warm and dry  Neuro:  Strength and sensation are intact Psych: seems easily agitated during exam today  EKG:  EKG is ordered today. The ekg ordered today shows sinus rhythm with PACs,    Recent Labs: 09/23/2014: ALT 33 01/26/2015: B Natriuretic Peptide 121.4*; Magnesium 1.8; TSH 1.309 02/25/2015: BUN 14; Creatinine 0.71; Hemoglobin 15.5; Platelets 233; Potassium 3.9; Sodium 138    Lipid Panel     Component Value Date/Time   CHOL 153 09/23/2014 1007   TRIG 234* 09/23/2014 1007   HDL 43 09/23/2014 1007   CHOLHDL 3.6 09/23/2014 1007   VLDL 47* 09/23/2014 1007   LDLCALC 63 09/23/2014 1007     Wt Readings from Last 3 Encounters:  03/09/15 239 lb 9.6 oz (108.682 kg)  03/08/15 230 lb (104.327 kg)  03/05/15 237 lb (107.502 kg)      Other studies Reviewed: Additional studies/ records that were reviewed today include: echo, myoview, and AF clinic notes  Review of the above records today demonstrates: low risk myoview, preserved EF with mild LA enlargement   ASSESSMENT AND PLAN:  1.  Persistent atrial fibrillation The patient has symptomatic afib.  He has failed medical therapy with sotalol and flecainide.  Therapeutic strategies for afib including medicine and ablation were discussed in detail with the patient today. Risk, benefits, and alternatives to EP study and radiofrequency ablation for afib were also discussed in detail today. These risks include but are not limited to stroke, bleeding, vascular damage, tamponade, perforation, damage to the esophagus, lungs, and other structures, pulmonary vein stenosis, worsening renal function, and death. The patient understands these risk and wishes to proceed.  We will  therefore proceed with catheter ablation at the next available time.  chads2vasc score is 1-2 (he denies htn and glucose interolance).   2. HTN Stable No change required today    Current medicines are reviewed at length with the patient today.   The patient does not have concerns regarding his medicines.  The following changes were made today:  none   Signed, [name], MD  03/09/2015 9:44 AM     [facility] [address] [place] [address] [phone] (office) [phone] (fax)

## 2015-03-12 NOTE — Interval H&P Note (Signed)
History and Physical Interval Note:  03/12/2015 7:52 AM  Jeremy Tucker  has presented today for surgery, with the diagnosis of afib  The various methods of treatment have been discussed with the patient and family. After consideration of risks, benefits and other options for treatment, the patient has consented to  Procedure(s): ATRIAL FIBRILLATION ABLATION (N/A) as a surgical intervention .  The patient's history has been reviewed, patient examined, no change in status, stable for surgery.  I have reviewed the patient's chart and labs.  Questions were answered to the patient's satisfaction.     [name]

## 2015-03-12 NOTE — Transfer of Care (Signed)
Immediate Anesthesia Transfer of Care Note  Patient: Jeremy Tucker  Procedure(s) Performed: Procedure(s): ATRIAL FIBRILLATION ABLATION (N/A)  Patient Location: PACU and Cath Lab  Anesthesia Type:General  Level of Consciousness: awake, alert , oriented and patient cooperative  Airway & Oxygen Therapy: Patient Spontanous Breathing and Patient connected to nasal cannula oxygen  Post-op Assessment: Report given to RN, Post -op Vital signs reviewed and stable, Patient moving all extremities and Patient moving all extremities X 4  Post vital signs: Reviewed and stable  Last Vitals:  Filed Vitals:   03/12/15 0940  BP:   Pulse: 75  Temp:   Resp: 18    Complications: No apparent anesthesia complications

## 2015-03-12 NOTE — Anesthesia Preprocedure Evaluation (Addendum)
Anesthesia Evaluation  Patient identified by MRN, date of birth, ID band Patient awake    Reviewed: Allergy & Precautions, NPO status , Patient's Chart, lab work & pertinent test results  Airway Mallampati: II   Neck ROM: full    Dental   Pulmonary asthma , sleep apnea ,  breath sounds clear to auscultation        Cardiovascular hypertension, + dysrhythmias Atrial Fibrillation + Valvular Problems/Murmurs AS Rhythm:irregular Rate:Normal  Moderate AS on TTE.   Neuro/Psych Depression    GI/Hepatic GERD-  ,  Endo/Other  obese  Renal/GU      Musculoskeletal  (+) Arthritis -,   Abdominal   Peds  Hematology   Anesthesia Other Findings   Reproductive/Obstetrics                            Anesthesia Physical Anesthesia Plan  ASA: III  Anesthesia Plan: MAC   Post-op Pain Management:    Induction: Intravenous  Airway Management Planned: Simple Face Mask  Additional Equipment:   Intra-op Plan:   Post-operative Plan:   Informed Consent: I have reviewed the patients History and Physical, chart, labs and discussed the procedure including the risks, benefits and alternatives for the proposed anesthesia with the patient or authorized representative who has indicated his/her understanding and acceptance.     Plan Discussed with: CRNA, Anesthesiologist and Surgeon  Anesthesia Plan Comments:         Anesthesia Quick Evaluation

## 2015-03-12 NOTE — Anesthesia Postprocedure Evaluation (Signed)
  Anesthesia Post-op Note  Patient: Jeremy Tucker  Procedure(s) Performed: Procedure(s): ATRIAL FIBRILLATION ABLATION (N/A)  Patient Location: PACU  Anesthesia Type: General   Level of Consciousness: awake, alert  and oriented  Airway and Oxygen Therapy: Patient Spontanous Breathing  Post-op Pain: mild  Post-op Assessment: Post-op Vital signs reviewed  Post-op Vital Signs: Reviewed  Last Vitals:  Filed Vitals:   03/12/15 1635  BP: 157/81  Pulse: 69  Temp:   Resp: 15    Complications: No apparent anesthesia complications

## 2015-03-12 NOTE — Anesthesia Procedure Notes (Signed)
Procedure Name: LMA Insertion Date/Time: 03/12/2015 10:23 AM Performed by: Shirlyn Goltz Pre-anesthesia Checklist: Patient identified, Suction available, Emergency Drugs available and Patient being monitored Patient Re-evaluated:Patient Re-evaluated prior to inductionOxygen Delivery Method: Circle system utilized Preoxygenation: Pre-oxygenation with 100% oxygen Intubation Type: IV induction Ventilation: Mask ventilation without difficulty LMA: LMA inserted LMA Size: 5.0 Number of attempts: 1 Placement Confirmation: positive ETCO2 and breath sounds checked- equal and bilateral Tube secured with: Tape Dental Injury: Teeth and Oropharynx as per pre-operative assessment

## 2015-03-12 NOTE — Discharge Instructions (Signed)
No driving for 5 days. No lifting over 5 lbs for 1 week. No sexual activity for 1 week. You may return to work in 1 week. Keep procedure site clean & dry. If you notice increased pain, swelling, bleeding or pus, call/return!  You may shower, but no soaking baths/hot tubs/pools for 1 week.  ° ° °

## 2015-03-12 NOTE — H&P (Signed)
     INTERVAL PROCEDURE H&P  History and Physical Interval Note:  03/12/2015 9:00 AM  Jeremy Tucker has presented today for their planned procedure. The various methods of treatment have been discussed with the patient and family. After consideration of risks, benefits and other options for treatment, the patient has consented to the procedure.  The patients' outpatient history has been reviewed, patient examined, and no change in status from most recent office note within the past 30 days. I have reviewed the patients' chart and labs and will proceed as planned. Questions were answered to the patient's satisfaction.   Pixie Casino, MD, [facility] Attending Cardiologist [facility]  HILTY,Kenneth C 03/12/2015, 9:00 AM

## 2015-03-12 NOTE — Progress Notes (Signed)
Site area: rt groin Site Prior to Removal:  Level 0 Pressure Applied For:  20 minutes Manual:   yes Patient Status During Pull:  stable Post Pull Site:  Level  0 Post Pull Instructions Given: yes  Post Pull Pulses Present:  yes Dressing Applied:  Small tegaderm Bedrest begins @  [id number] Comments: none

## 2015-03-13 ENCOUNTER — Encounter (HOSPITAL_COMMUNITY): Payer: Self-pay | Admitting: Internal Medicine

## 2015-03-13 DIAGNOSIS — I481 Persistent atrial fibrillation: Secondary | ICD-10-CM | POA: Diagnosis not present

## 2015-03-13 DIAGNOSIS — E785 Hyperlipidemia, unspecified: Secondary | ICD-10-CM | POA: Diagnosis not present

## 2015-03-13 DIAGNOSIS — I080 Rheumatic disorders of both mitral and aortic valves: Secondary | ICD-10-CM | POA: Diagnosis not present

## 2015-03-13 DIAGNOSIS — I10 Essential (primary) hypertension: Secondary | ICD-10-CM | POA: Diagnosis not present

## 2015-03-13 DIAGNOSIS — I480 Paroxysmal atrial fibrillation: Secondary | ICD-10-CM

## 2015-03-13 LAB — BASIC METABOLIC PANEL
Anion gap: 10 (ref 5–15)
BUN: 12 mg/dL (ref 6–23)
CO2: 30 mmol/L (ref 19–32)
Calcium: 8.7 mg/dL (ref 8.4–10.5)
Chloride: 100 mmol/L (ref 96–112)
Creatinine, Ser: 0.88 mg/dL (ref 0.50–1.35)
GFR calc Af Amer: >90 mL/min (ref >90)
GFR calc non Af Amer: 89 mL/min — ABNORMAL LOW (ref >90)
Glucose, Bld: 110 mg/dL — ABNORMAL HIGH (ref 70–99)
Potassium: 3.6 mmol/L (ref 3.5–5.1)
Sodium: 140 mmol/L (ref 135–145)

## 2015-03-13 LAB — POCT ACTIVATED CLOTTING TIME: Activated Clotting Time: 294 seconds

## 2015-03-13 NOTE — Progress Notes (Signed)
Utilization Review Completed.Donne Anon T4/29/2016

## 2015-03-22 ENCOUNTER — Other Ambulatory Visit: Payer: Self-pay | Admitting: Physician Assistant

## 2015-03-23 ENCOUNTER — Ambulatory Visit (INDEPENDENT_AMBULATORY_CARE_PROVIDER_SITE_OTHER): Payer: BLUE CROSS/BLUE SHIELD | Admitting: Interventional Cardiology

## 2015-03-23 ENCOUNTER — Encounter: Payer: Self-pay | Admitting: Interventional Cardiology

## 2015-03-23 ENCOUNTER — Telehealth: Payer: Self-pay | Admitting: *Deleted

## 2015-03-23 VITALS — BP 148/82 | HR 89 | Ht 73.0 in | Wt 234.1 lb

## 2015-03-23 DIAGNOSIS — I480 Paroxysmal atrial fibrillation: Secondary | ICD-10-CM

## 2015-03-23 DIAGNOSIS — I350 Nonrheumatic aortic (valve) stenosis: Secondary | ICD-10-CM | POA: Diagnosis not present

## 2015-03-23 DIAGNOSIS — I519 Heart disease, unspecified: Secondary | ICD-10-CM

## 2015-03-23 DIAGNOSIS — E785 Hyperlipidemia, unspecified: Secondary | ICD-10-CM | POA: Diagnosis not present

## 2015-03-23 DIAGNOSIS — I5189 Other ill-defined heart diseases: Secondary | ICD-10-CM

## 2015-03-23 NOTE — Telephone Encounter (Signed)
Please review for refill [place] pt.

## 2015-03-23 NOTE — Patient Instructions (Signed)
Medication Instructions:  Your physician recommends that you continue on your current medications as directed. Please refer to the Current Medication list given to you today.    Labwork: None   Testing/Procedures: None   Follow-Up: Your physician wants you to follow-up in: 6 months with Dr.Smith You will receive a reminder letter in the mail two months in advance. If you don't receive a letter, please call our office to schedule the follow-up appointment.   Any Other Special Instructions Will Be Listed Below (If Applicable).   

## 2015-03-23 NOTE — Progress Notes (Signed)
Cardiology Office Note   Date:  03/23/2015   ID:  Jeremy Tucker, DOB 04/11/1951, MRN [id number]  PCP:  [name], MD  Cardiologist:   [name], MD   Chief Complaint  Patient presents with  . PAROXYSMAL ATRIAL FIBRILLATION      History of Present Illness: Jeremy Tucker is a 64 y.o. male who presents for paroxysmal atrial fibrillation, A. fib ablation, May 2016, moderate aortic stenosis by most recent echo (2016, April).  Doing well. Has some dyspnea with climbing stairs. No dyspnea during his typical walk. Has occasional fleeting chest pain. No episodes of syncope.    Past Medical History  Diagnosis Date  . Hyperlipidemia   . GERD (gastroesophageal reflux disease)   . BPH (benign prostatic hyperplasia)   . Gout     denies  . Obesity   . Persistent atrial fibrillation     a. CHADsVASc score at least 2  . Aortic stenosis, mild   . Kidney stones   . DVT (deep venous thrombosis)   . Depression   . Hypertension     denies  . Glucose intolerance (impaired glucose tolerance)   . DJD (degenerative joint disease)     Past Surgical History  Procedure Laterality Date  . Colonoscopy  2010    MEDOFF  . Total knee arthroplasty Left 2001    MURPHY  . Tonsillectomy    . Joint replacement    . Knee arthroscopy Bilateral "multiple times"  . Cystoscopy w/ stone manipulation  1980's    "couldn't get stone so they had to cut me open"  . Kidney stone surgery  1980's    "stone lodged in the duct; had to cut me open to get it"  . Fracture surgery    . Forearm fracture surgery Right ~ 1961  . Cardiac catheterization  1990's    "Dr. Melvern Banker", no blockages per pt  . Cardioversion N/A 02/25/2015    Procedure: CARDIOVERSION;  Surgeon: Sueanne Margarita, MD;  Location: [facility] ENDOSCOPY;  Service: Cardiovascular;  Laterality: N/A;  . Atrial fibrillation ablation N/A 03/12/2015    Procedure: ATRIAL FIBRILLATION ABLATION;  Surgeon: [name], MD;  Location: [facility] CATH LAB;   Service: Cardiovascular;  Laterality: N/A;  . Tee without cardioversion N/A 03/12/2015    Procedure: TRANSESOPHAGEAL ECHOCARDIOGRAM (TEE);  Surgeon: Pixie Casino, MD;  Location: [facility] ENDOSCOPY;  Service: Cardiovascular;  Laterality: N/A;     Current Outpatient Prescriptions  Medication Sig Dispense Refill  . acetaminophen (TYLENOL) 500 MG tablet Take 500-1,000 mg by mouth every 6 (six) hours as needed for headache.    . albuterol (PROVENTIL HFA;VENTOLIN HFA) 108 (90 BASE) MCG/ACT inhaler Inhale 2 puffs into the lungs every 6 (six) hours as needed for wheezing or shortness of breath.    . allopurinol (ZYLOPRIM) 300 MG tablet Take 300 mg by mouth daily.      Marland Kitchen apixaban (ELIQUIS) 5 MG TABS tablet Take 1 tablet (5 mg total) by mouth 2 (two) times daily. 60 tablet 0  . atorvastatin (LIPITOR) 20 MG tablet Take 1 tablet (20 mg total) by mouth daily at 6 PM. (Patient taking differently: Take 20 mg by mouth at bedtime. ) 90 tablet 0  . buPROPion (WELLBUTRIN XL) 300 MG 24 hr tablet Take 1 tablet (300 mg total) by mouth daily. 90 tablet 1  . busPIRone (BUSPAR) 5 MG tablet Take 1 tablet (5 mg total) by mouth 3 (three) times daily. 60 tablet 1  .  diltiazem (CARDIZEM CD) 180 MG 24 hr capsule Take 1 capsule (180 mg total) by mouth daily. 30 capsule 3  . diltiazem (CARDIZEM) 30 MG tablet Take one tablet every 4 - 6 hours as needed for breatkthrough AFib -- if heart rate is greater than 623 and systolic BP is greater than 110. (Patient taking differently: Take 30 mg by mouth every 4 (four) hours as needed (for breakthrough Afib - if heart rate is greater than 762 and systolic BP is greater than 110). ) 20 tablet 1  . fish oil-omega-3 fatty acids 1000 MG capsule Take 1 g by mouth daily.     Marland Kitchen lisinopril-hydrochlorothiazide (PRINZIDE,ZESTORETIC) 10-12.5 MG per tablet Take 0.5 tablets by mouth daily.    Marland Kitchen loratadine (CLARITIN) 10 MG tablet Take 10 mg by mouth daily.    . mometasone (ASMANEX) 220 MCG/INH inhaler  Inhale 2 puffs into the lungs 2 (two) times daily as needed (wheezing from bronchitis).     . Multiple Vitamins-Minerals (MULTIVITAMIN WITH MINERALS) tablet Take 1 tablet by mouth daily.      Marland Kitchen olopatadine (PATANOL) 0.1 % ophthalmic solution Place 1 drop into both eyes daily as needed (seasonal allergies).     Marland Kitchen omeprazole (PRILOSEC) 20 MG capsule Take 20 mg by mouth daily.    . Potassium Bicarb-Citric Acid 20 MEQ TBEF Take 20 mEq by mouth daily. Dissolve in water and drink    . silodosin (RAPAFLO) 4 MG CAPS capsule Take 4 mg by mouth at bedtime.     Marland Kitchen zolpidem (AMBIEN) 10 MG tablet Take 3.33-5 mg by mouth at bedtime. 1/3 to 1/2 tablet     No current facility-administered medications for this visit.    Allergies:   Codeine and Oxycodone    Social History:  The patient  reports that he has never smoked. He has never used smokeless tobacco. He reports that he drinks alcohol. He reports that he does not use illicit drugs.   Family History:  The patient's family history includes Healthy in his brother; Heart attack (age of onset: 1) in his father; Heart attack (age of onset: 9) in his mother.    ROS:  Please see the history of present illness.   Otherwise, review of systems are positive for depression, joint swelling.   All other systems are reviewed and negative.    PHYSICAL EXAM: VS:  BP 148/82 mmHg  Pulse 89  Ht 6\' 1"  (1.854 m)  Wt 234 lb 1.9 oz (106.196 kg)  BMI 30.90 kg/m2 , BMI Body mass index is 30.9 kg/(m^2). GEN: Well nourished, well developed, in no acute distress HEENT: normal Neck: no JVD, carotid bruits, or masses Cardiac: 2/6 systolic murmur right upper sternal border, crescendo decrescendo. RRR; rubs, or gallops,no edema  Respiratory:  clear to auscultation bilaterally, normal work of breathing GI: soft, nontender, nondistended, + BS MS: no deformity or atrophy Skin: warm and dry, no rash Neuro:  Strength and sensation are intact Psych: euthymic mood, full  affect   EKG:  EKG is ordered today. The ekg ordered today demonstrates biatrial abnormality, otherwise normal.   Recent Labs: 09/23/2014: ALT 33 01/26/2015: B Natriuretic Peptide 121.4*; Magnesium 1.8; TSH 1.309 03/09/2015: Hemoglobin 14.8; Platelets 232.0 03/13/2015: BUN 12; Creatinine 0.88; Potassium 3.6; Sodium 140    Lipid Panel    Component Value Date/Time   CHOL 153 09/23/2014 1007   TRIG 234* 09/23/2014 1007   HDL 43 09/23/2014 1007   CHOLHDL 3.6 09/23/2014 1007   VLDL 47* 09/23/2014 1007  LDLCALC 63 09/23/2014 1007      Wt Readings from Last 3 Encounters:  03/23/15 234 lb 1.9 oz (106.196 kg)  03/12/15 240 lb 4.8 oz (109 kg)  03/09/15 239 lb 9.6 oz (108.682 kg)      Other studies Reviewed: Additional studies/ records that were reviewed today include: . Review of the above records demonstrates: Reviewed the note from his ablation. We discussed the natural history of aortic stenosis.   ASSESSMENT AND PLAN:  Paroxysmal atrial fibrillation - currently in sinus rhythm post ablation  Aortic stenosis, moderate - asymptomatic   Essential hypertension: Controlled   Diastolic dysfunction-grade 15 Sep 2014  Hyperlipidemia LDL goal <70     Current medicines are reviewed at length with the patient today.  The patient does not have concerns regarding medicines.  The following changes have been made:  no change area  We discussed the natural history of aortic stenosis. We described the cardinal features of aortic stenosis. I will see the patient again in 6 months for clinical follow-up of both AF and AS.  Labs/ tests ordered today include:  No orders of the defined types were placed in this encounter.     Disposition:   FU with HS in 6 months  Signed, [name], MD  03/23/2015 9:19 AM    [facility] [address], [place], [place]  [address] Phone: [phone]; Fax: [phone]

## 2015-03-23 NOTE — Telephone Encounter (Signed)
Need prior auth done for eliquis

## 2015-03-23 NOTE — Telephone Encounter (Signed)
Sent to Dr Thompson Caul CMA/ Lattie Haw

## 2015-03-25 ENCOUNTER — Telehealth: Payer: Self-pay | Admitting: Family Medicine

## 2015-03-25 ENCOUNTER — Telehealth: Payer: Self-pay | Admitting: Internal Medicine

## 2015-03-25 MED ORDER — BUSPIRONE HCL 10 MG PO TABS: 10.0000 mg | ORAL_TABLET | Freq: Three times a day (TID) | ORAL | Status: DC

## 2015-03-25 NOTE — Telephone Encounter (Signed)
He has been under a tremendous amount of stress from work as well as medical issues. I will increase his BuSpar to 10 twice a day.

## 2015-03-25 NOTE — Telephone Encounter (Signed)
Pt just picked up Buspar script and he says it is not the mg's and directions that he was told he would get. Did Dr Redmond School change his mind on what he wanted pt to have? Pt received 10mg  with directions to take 1 pill 3 times a day. Call pt at [phone]

## 2015-03-25 NOTE — Telephone Encounter (Signed)
Have not received prior auth rqst from pt pharmacy. Called pt to get more info. lmtcb Pt was provided samples at hi 5/9 o/v

## 2015-03-25 NOTE — Telephone Encounter (Signed)
He called indicating the prescription was written for 3 times a day. I wanted twice a day. I related that to him. He will take 1 pill twice per day

## 2015-03-25 NOTE — Telephone Encounter (Signed)
Pt needs a refill on his buspar 5mg  #90 since he takes 3 a day. To [facility] [place] and [address]

## 2015-04-04 ENCOUNTER — Encounter (HOSPITAL_COMMUNITY): Payer: Self-pay | Admitting: Emergency Medicine

## 2015-04-04 ENCOUNTER — Emergency Department (HOSPITAL_COMMUNITY)
Admission: EM | Admit: 2015-04-04 | Discharge: 2015-04-04 | Disposition: A | Discharge status: Home / Self Care | Payer: BLUE CROSS/BLUE SHIELD | Attending: Emergency Medicine | Admitting: Emergency Medicine

## 2015-04-04 ENCOUNTER — Emergency Department (HOSPITAL_COMMUNITY): Payer: BLUE CROSS/BLUE SHIELD

## 2015-04-04 DIAGNOSIS — Z86718 Personal history of other venous thrombosis and embolism: Secondary | ICD-10-CM | POA: Insufficient documentation

## 2015-04-04 DIAGNOSIS — E669 Obesity, unspecified: Secondary | ICD-10-CM | POA: Diagnosis not present

## 2015-04-04 DIAGNOSIS — N200 Calculus of kidney: Secondary | ICD-10-CM | POA: Diagnosis not present

## 2015-04-04 DIAGNOSIS — R109 Unspecified abdominal pain: Secondary | ICD-10-CM | POA: Diagnosis present

## 2015-04-04 DIAGNOSIS — K219 Gastro-esophageal reflux disease without esophagitis: Secondary | ICD-10-CM | POA: Insufficient documentation

## 2015-04-04 DIAGNOSIS — Z8679 Personal history of other diseases of the circulatory system: Secondary | ICD-10-CM | POA: Insufficient documentation

## 2015-04-04 DIAGNOSIS — F329 Major depressive disorder, single episode, unspecified: Secondary | ICD-10-CM | POA: Diagnosis not present

## 2015-04-04 DIAGNOSIS — Z79899 Other long term (current) drug therapy: Secondary | ICD-10-CM | POA: Diagnosis not present

## 2015-04-04 DIAGNOSIS — Z9889 Other specified postprocedural states: Secondary | ICD-10-CM | POA: Diagnosis not present

## 2015-04-04 DIAGNOSIS — E785 Hyperlipidemia, unspecified: Secondary | ICD-10-CM | POA: Diagnosis not present

## 2015-04-04 DIAGNOSIS — N400 Enlarged prostate without lower urinary tract symptoms: Secondary | ICD-10-CM | POA: Diagnosis not present

## 2015-04-04 DIAGNOSIS — Z8739 Personal history of other diseases of the musculoskeletal system and connective tissue: Secondary | ICD-10-CM | POA: Insufficient documentation

## 2015-04-04 LAB — CBC WITH DIFFERENTIAL/PLATELET
Basophils Absolute: 0 10*3/uL (ref 0.0–0.1)
Basophils Relative: 0 % (ref 0–1)
Eosinophils Absolute: 0.1 10*3/uL (ref 0.0–0.7)
Eosinophils Relative: 1 % (ref 0–5)
HCT: 44 % (ref 39.0–52.0)
Hemoglobin: 15 g/dL (ref 13.0–17.0)
Lymphocytes Relative: 8 % — ABNORMAL LOW (ref 12–46)
Lymphs Abs: 1 10*3/uL (ref 0.7–4.0)
MCH: 31.3 pg (ref 26.0–34.0)
MCHC: 34.1 g/dL (ref 30.0–36.0)
MCV: 91.9 fL (ref 78.0–100.0)
Monocytes Absolute: 1.1 10*3/uL — ABNORMAL HIGH (ref 0.1–1.0)
Monocytes Relative: 8 % (ref 3–12)
Neutro Abs: 11 10*3/uL — ABNORMAL HIGH (ref 1.7–7.7)
Neutrophils Relative %: 83 % — ABNORMAL HIGH (ref 43–77)
Platelets: 199 10*3/uL (ref 150–400)
RBC: 4.79 MIL/uL (ref 4.22–5.81)
RDW: 12.5 % (ref 11.5–15.5)
WBC: 13.3 10*3/uL — ABNORMAL HIGH (ref 4.0–10.5)

## 2015-04-04 LAB — URINE MICROSCOPIC-ADD ON

## 2015-04-04 LAB — COMPREHENSIVE METABOLIC PANEL
ALT: 39 U/L (ref 17–63)
AST: 24 U/L (ref 15–41)
Albumin: 4.1 g/dL (ref 3.5–5.0)
Alkaline Phosphatase: 72 U/L (ref 38–126)
Anion gap: 12 (ref 5–15)
BUN: 18 mg/dL (ref 6–20)
CO2: 27 mmol/L (ref 22–32)
Calcium: 9.4 mg/dL (ref 8.9–10.3)
Chloride: 98 mmol/L — ABNORMAL LOW (ref 101–111)
Creatinine, Ser: 0.83 mg/dL (ref 0.61–1.24)
GFR calc Af Amer: >60 mL/min (ref >60)
GFR calc non Af Amer: >60 mL/min (ref >60)
Glucose, Bld: 142 mg/dL — ABNORMAL HIGH (ref 65–99)
Potassium: 3.9 mmol/L (ref 3.5–5.1)
Sodium: 137 mmol/L (ref 135–145)
Total Bilirubin: 0.7 mg/dL (ref 0.3–1.2)
Total Protein: 7.2 g/dL (ref 6.5–8.1)

## 2015-04-04 LAB — URINALYSIS, ROUTINE W REFLEX MICROSCOPIC
Bilirubin Urine: NEGATIVE
Glucose, UA: NEGATIVE mg/dL
Ketones, ur: NEGATIVE mg/dL
Leukocytes, UA: NEGATIVE
Nitrite: NEGATIVE
Protein, ur: NEGATIVE mg/dL
Specific Gravity, Urine: 1.021 (ref 1.005–1.030)
Urobilinogen, UA: 0.2 mg/dL (ref 0.0–1.0)
pH: 7.5 (ref 5.0–8.0)

## 2015-04-04 LAB — LIPASE, BLOOD: Lipase: 18 U/L — ABNORMAL LOW (ref 22–51)

## 2015-04-04 MED ORDER — SODIUM CHLORIDE 0.9 % IV BOLUS (SEPSIS)
1000.0000 mL | Freq: Once | INTRAVENOUS | Status: AC
  Administered 2015-04-04: 1000 mL via INTRAVENOUS

## 2015-04-04 MED ORDER — KETOROLAC TROMETHAMINE 30 MG/ML IJ SOLN
30.0000 mg | Freq: Once | INTRAMUSCULAR | Status: AC
  Administered 2015-04-04: 30 mg via INTRAVENOUS
  Filled 2015-04-04: qty 1

## 2015-04-04 MED ORDER — ONDANSETRON HCL 4 MG/2ML IJ SOLN
4.0000 mg | Freq: Once | INTRAMUSCULAR | Status: AC
  Administered 2015-04-04: 4 mg via INTRAVENOUS
  Filled 2015-04-04: qty 2

## 2015-04-04 MED ORDER — HYDROMORPHONE HCL 1 MG/ML IJ SOLN
1.0000 mg | Freq: Once | INTRAMUSCULAR | Status: AC
  Administered 2015-04-04: 1 mg via INTRAVENOUS
  Filled 2015-04-04: qty 1

## 2015-04-04 MED ORDER — ONDANSETRON HCL 4 MG PO TABS: 4.0000 mg | ORAL_TABLET | Freq: Three times a day (TID) | ORAL | Status: DC | PRN

## 2015-04-04 MED ORDER — LIDOCAINE HCL 1 % IJ SOLN
INTRAMUSCULAR | Status: AC
  Administered 2015-04-04: 07:00:00
  Filled 2015-04-04: qty 20

## 2015-04-04 NOTE — ED Notes (Signed)
Pt refused needlestick for IV line insertion---- states, "You have to give me an injection to numb it".  Pt was offered use of "Pain Ease" topical anesthetic but pt refused, stated, "At [facility], they injected something with a tiny needle.  I don't like to try that spray".

## 2015-04-04 NOTE — Discharge Instructions (Signed)

## 2015-04-04 NOTE — ED Notes (Addendum)
Pt from home c/o left flank pain radiating to left lowe abdomen. Denies dysuria or hematuria. He states "I've mucho many, I know what they are". He reports taking PO dilaudid for pain relief which he states his PCP prescribes for pain and half of  an Ambiem. Pt also report he was Afib in which he takes elaquist for. Pt also requests that if he receives IV dye "please push it slow".

## 2015-04-04 NOTE — ED Notes (Signed)
Pt. Made aware for the need of urine. 

## 2015-04-04 NOTE — ED Notes (Signed)
Patient transported to CT 

## 2015-04-04 NOTE — ED Provider Notes (Signed)
CSN: [id number]     Arrival date & time 04/04/15  2703 History   None    Chief Complaint  Patient presents with  . Flank Pain     (Consider location/radiation/quality/duration/timing/severity/associated sxs/prior Treatment) HPI Jeremy Tucker is a 64 year old male with past medical history of previous kidney stones who presents the ER complaining of left-sided flank pain. Patient reports acute onset of left flank pain approximately 2:00 this morning. Patient reports mild waxing and waning of his pain, reports no aggravating or alleviating factors. Patient states "no matter what I do I cannot get comfortable". Patient reports associated nausea and vomiting. Patient states he has had multiple kidney stones in the past, however has not had any kidney stones in multiple years. Patient states he has seen Dr. Gaynelle Arabian in the past with urology for same.  Past Medical History  Diagnosis Date  . Hyperlipidemia   . GERD (gastroesophageal reflux disease)   . BPH (benign prostatic hyperplasia)   . Gout     denies  . Obesity   . Persistent atrial fibrillation     a. CHADsVASc score at least 2  . Aortic stenosis, mild   . Kidney stones   . DVT (deep venous thrombosis)   . Depression   . Hypertension     denies  . Glucose intolerance (impaired glucose tolerance)   . DJD (degenerative joint disease)    Past Surgical History  Procedure Laterality Date  . Colonoscopy  2010    MEDOFF  . Total knee arthroplasty Left 2001    MURPHY  . Tonsillectomy    . Joint replacement    . Knee arthroscopy Bilateral "multiple times"  . Cystoscopy w/ stone manipulation  1980's    "couldn't get stone so they had to cut me open"  . Kidney stone surgery  1980's    "stone lodged in the duct; had to cut me open to get it"  . Fracture surgery    . Forearm fracture surgery Right ~ 1961  . Cardiac catheterization  1990's    "Dr. Melvern Banker", no blockages per pt  . Cardioversion N/A 02/25/2015    Procedure:  CARDIOVERSION;  Surgeon: Sueanne Margarita, MD;  Location: [facility] ENDOSCOPY;  Service: Cardiovascular;  Laterality: N/A;  . Atrial fibrillation ablation N/A 03/12/2015    Procedure: ATRIAL FIBRILLATION ABLATION;  Surgeon: [name], MD;  Location: [facility] CATH LAB;  Service: Cardiovascular;  Laterality: N/A;  . Tee without cardioversion N/A 03/12/2015    Procedure: TRANSESOPHAGEAL ECHOCARDIOGRAM (TEE);  Surgeon: Pixie Casino, MD;  Location: [facility] ENDOSCOPY;  Service: Cardiovascular;  Laterality: N/A;   Family History  Problem Relation Age of Onset  . Heart attack Mother 53    MI  . Heart attack Father 42    ? MI versus trauma to head  . Healthy Brother    History  Substance Use Topics  . Smoking status: Never Smoker   . Smokeless tobacco: Never Used  . Alcohol Use: Yes     Comment: 01/21/2015 "maybe 4-5 drinks/yr"    Review of Systems  Constitutional: Negative for fever.  HENT: Negative for trouble swallowing.   Eyes: Negative for visual disturbance.  Respiratory: Negative for shortness of breath.   Cardiovascular: Negative for chest pain.  Gastrointestinal: Positive for nausea and vomiting. Negative for abdominal pain.  Genitourinary: Positive for flank pain. Negative for dysuria.  Musculoskeletal: Negative for neck pain.  Skin: Negative for rash.  Neurological: Negative for dizziness, weakness and numbness.  Psychiatric/Behavioral: Negative.  Allergies  Codeine and Oxycodone  Home Medications   Prior to Admission medications   Medication Sig Start Date End Date Taking? Authorizing Provider  allopurinol (ZYLOPRIM) 300 MG tablet Take 300 mg by mouth daily.     Yes Historical Provider, MD  atorvastatin (LIPITOR) 20 MG tablet Take 1 tablet (20 mg total) by mouth daily at 6 PM. Patient taking differently: Take 20 mg by mouth at bedtime.  09/23/14  Yes Denita Lung, MD  buPROPion (WELLBUTRIN XL) 300 MG 24 hr tablet Take 1 tablet (300 mg total) by mouth daily. 09/23/14  Yes Denita Lung, MD  busPIRone (BUSPAR) 10 MG tablet Take 1 tablet (10 mg total) by mouth 3 (three) times daily. 03/25/15  Yes Denita Lung, MD  ELIQUIS 5 MG TABS tablet TAKE 1 TABLET BY MOUTH TWICE DAILY 03/25/15  Yes Belva Crome, MD  fish oil-omega-3 fatty acids 1000 MG capsule Take 1 g by mouth daily.    Yes Historical Provider, MD  lisinopril-hydrochlorothiazide (PRINZIDE,ZESTORETIC) 10-12.5 MG per tablet Take 0.5 tablets by mouth daily. 02/24/15  Yes Sherran Needs, NP  loratadine (CLARITIN) 10 MG tablet Take 10 mg by mouth daily.   Yes Historical Provider, MD  mometasone [facility]) 220 MCG/INH inhaler Inhale 2 puffs into the lungs 2 (two) times daily as needed (wheezing from bronchitis).    Yes Historical Provider, MD  Multiple Vitamins-Minerals (MULTIVITAMIN WITH MINERALS) tablet Take 1 tablet by mouth daily.     Yes Historical Provider, MD  omeprazole (PRILOSEC) 20 MG capsule Take 20 mg by mouth daily.   Yes Historical Provider, MD  Potassium Bicarb-Citric Acid 20 MEQ TBEF Take 20 mEq by mouth daily. Dissolve in water and drink   Yes Historical Provider, MD  silodosin (RAPAFLO) 4 MG CAPS capsule Take 4 mg by mouth at bedtime.    Yes Historical Provider, MD  zolpidem (AMBIEN) 10 MG tablet Take 3.33-5 mg by mouth at bedtime. 1/3 to 1/2 tablet   Yes Historical Provider, MD  acetaminophen (TYLENOL) 500 MG tablet Take 500-1,000 mg by mouth every 6 (six) hours as needed for headache.    Historical Provider, MD  albuterol (PROVENTIL HFA;VENTOLIN HFA) 108 (90 BASE) MCG/ACT inhaler Inhale 2 puffs into the lungs every 6 (six) hours as needed for wheezing or shortness of breath.    Historical Provider, MD  diltiazem (CARDIZEM CD) 180 MG 24 hr capsule Take 1 capsule (180 mg total) by mouth daily. Patient not taking: Reported on 04/04/2015 02/24/15   Sherran Needs, NP  diltiazem (CARDIZEM) 30 MG tablet Take one tablet every 4 - 6 hours as needed for breatkthrough AFib -- if heart rate is greater than 627 and  systolic BP is greater than 110. Patient not taking: Reported on 04/04/2015 03/06/15   Sherran Needs, NP  olopatadine (PATANOL) 0.1 % ophthalmic solution Place 1 drop into both eyes daily as needed (seasonal allergies).     Historical Provider, MD  ondansetron (ZOFRAN) 4 MG tablet Take 1 tablet (4 mg total) by mouth every 8 (eight) hours as needed for nausea or vomiting. 04/04/15   Dahlia Bailiff, PA-C   BP 137/75 mmHg  Pulse 99  Temp(Src) 97.8 F (36.6 C) (Oral)  Resp 16  SpO2 96% Physical Exam  Constitutional: He is oriented to person, place, and time. He appears well-developed and well-nourished.  Male, uncomfortable in bed, in moderate distress due to pain  HENT:  Head: Normocephalic and atraumatic.  Mouth/Throat: Oropharynx is clear  and moist. No oropharyngeal exudate.  Eyes: Right eye exhibits no discharge. Left eye exhibits no discharge. No scleral icterus.  Neck: Normal range of motion.  Cardiovascular: Normal rate, regular rhythm and normal heart sounds.   No murmur heard. Pulmonary/Chest: Effort normal and breath sounds normal. No respiratory distress.  Abdominal: Soft. There is no tenderness. There is CVA tenderness. There is no rigidity, no guarding, no tenderness at McBurney's point and negative Murphy's sign.  Musculoskeletal: Normal range of motion. He exhibits no edema or tenderness.  Neurological: He is alert and oriented to person, place, and time. He has normal strength. No cranial nerve deficit or sensory deficit. Coordination normal. GCS eye subscore is 4. GCS verbal subscore is 5. GCS motor subscore is 6.  Patient fully alert, answering questions appropriately in full, clear sentences. Cranial nerves II through XII grossly intact. Motor strength 5 out of 5 in all major muscle groups of upper and lower extremities. Distal sensation intact.   Skin: Skin is warm and dry. No rash noted. He is not diaphoretic.  Psychiatric: He has a normal mood and affect.    ED Course   Angiocath insertion Date/Time: 04/04/2015 9:15 AM Performed by: Dahlia Bailiff Authorized by: Dahlia Bailiff Consent: Verbal consent obtained. Consent given by: patient Patient identity confirmed: verbally with patient Preparation: Patient was prepped and draped in the usual sterile fashion. Local anesthesia used: yes Anesthesia: local infiltration Local anesthetic: lidocaine 1% without epinephrine Anesthetic total: 1 ml Patient sedated: no Patient tolerance: Patient tolerated the procedure well with no immediate complications Comments: 02OV IV in L AC inserted s complication    (including critical care time) Labs Review Labs Reviewed  CBC WITH DIFFERENTIAL/PLATELET - Abnormal; Notable for the following:    WBC 13.3 (*)    Neutrophils Relative % 83 (*)    Neutro Abs 11.0 (*)    Lymphocytes Relative 8 (*)    Monocytes Absolute 1.1 (*)    All other components within normal limits  COMPREHENSIVE METABOLIC PANEL - Abnormal; Notable for the following:    Chloride 98 (*)    Glucose, Bld 142 (*)    All other components within normal limits  LIPASE, BLOOD - Abnormal; Notable for the following:    Lipase 18 (*)    All other components within normal limits  URINALYSIS, ROUTINE W REFLEX MICROSCOPIC - Abnormal; Notable for the following:    Hgb urine dipstick TRACE (*)    All other components within normal limits  URINE MICROSCOPIC-ADD ON    Imaging Review Ct Renal Stone Study  04/04/2015   CLINICAL DATA:  Acute onset left-sided flank pain radiating to the left lower quadrant of the abdomen. Current history of urinary tract calculi.  EXAM: CT ABDOMEN AND PELVIS WITHOUT CONTRAST  TECHNIQUE: Multidetector CT imaging of the abdomen and pelvis was performed following the standard protocol without IV contrast.  COMPARISON:  01/08/2007 dating back to 06/24/2005.  FINDINGS: Approximate 4 mm calculus at the left ureterovesical junction causing mild left hydronephrosis, left renal edema and left  perinephric edema. Numerous bilateral renal calculi, the largest in an upper pole calix of the left kidney approximating 7 mm and in a lower pole calix of the right kidney approximating 6 mm. No evidence of right urinary tract obstruction. Focal scarring involving the lower pole of the right kidney. Numerous cortical cysts involving both kidneys. Within the limits of the unenhanced technique, no solid renal masses. Urinary bladder decompressed and unremarkable.  Normal unenhanced appearance of the  liver, spleen, adrenal glands, and gallbladder. No biliary ductal dilation. Focus of accessory splenic tissue medial to the lower pole of the spleen. Mild pancreatic atrophy without focal pancreatic parenchymal abnormality, unchanged. Moderate aortoiliofemoral atherosclerosis without aneurysm. No significant lymphadenopathy.  Normal-appearing stomach. Inspissated stool like material throughout a several cm segment of the distal and terminal ileum. Small bowel otherwise normal in appearance. Normal appearing colon with large stool burden. Normal-appearing decompressed appendix in the right upper pelvis. No ascites.  Moderate to marked prostate gland enlargement, particularly the median lobe. Normal seminal vesicles. Very small bilateral inguinal hernias containing fat. Calcifications near the attachment of 1 of the right hamstring muscles to the symphysis pubis consistent with an old "tug" injury.  Bone window images demonstrate mild degenerative changes in the lower thoracic and lumbar spine. Visualized lung bases clear. Heart size normal with aortic annular calcification.  IMPRESSION: 1. Obstructing approximate 4 mm calculus at the left ureterovesical junction. 2. Numerous bilateral renal calculi. 3. Stable mild diffuse pancreatic atrophy. 4. Moderate to marked prostate gland enlargement, particularly the median lobe. 5. Inspissated stool like material involving the distal small bowel consistent with stasis.    Electronically Signed   By: Evangeline Dakin M.D.   On: 04/04/2015 08:01     EKG Interpretation None      MDM   Final diagnoses:  Left flank pain  Kidney stone    Patient here with signs and symptoms consistent with kidney stone. Patient reports having kidney stones in the past, has not had one over 10 years. Will follow-up with CT abdomen pelvis without contrast.  CT with impression: 1. Obstructing approximate 4 mm calculus at the left ureterovesical junction. 2. Numerous bilateral renal calculi. 3. Stable mild diffuse pancreatic atrophy. 4. Moderate to marked prostate gland enlargement, particularly the median lobe. 5. Inspissated stool like material involving the distal small bowel consistent with stasis.  After symptomatically therapy here, patient's symptoms have improved, patient's lab work unremarkable for acute pathology. Patient hemodynamically stable and in no acute distress. No concern for acute abdomen. Patient will be discharged at this time to follow-up with urology. Patient sees Dr. Gaynelle Arabian as outpatient. Patient states he has prescribed by mouth Dilaudid as well as Flomax at home. We'll give prescription for Zofran. Encouraged follow-up with urology. Return precautions discussed, patient verbalizes understanding and agreement of this plan.  BP 137/75 mmHg  Pulse 99  Temp(Src) 97.8 F (36.6 C) (Oral)  Resp 16  SpO2 96%  Signed,  Dahlia Bailiff, PA-C 11:00 AM   Dahlia Bailiff, PA-C 04/04/15 [place], MD 04/04/15 1537

## 2015-04-08 ENCOUNTER — Encounter (HOSPITAL_COMMUNITY): Payer: Self-pay | Admitting: Nurse Practitioner

## 2015-04-08 ENCOUNTER — Ambulatory Visit (HOSPITAL_COMMUNITY)
Admission: RE | Admit: 2015-04-08 | Discharge: 2015-04-08 | Disposition: A | Discharge status: Home / Self Care | Payer: BLUE CROSS/BLUE SHIELD | Source: Ambulatory Visit | Attending: Nurse Practitioner | Admitting: Nurse Practitioner

## 2015-04-08 VITALS — BP 122/86 | HR 95 | Ht 72.0 in | Wt 231.8 lb

## 2015-04-08 DIAGNOSIS — I10 Essential (primary) hypertension: Secondary | ICD-10-CM | POA: Insufficient documentation

## 2015-04-08 DIAGNOSIS — I4819 Other persistent atrial fibrillation: Secondary | ICD-10-CM

## 2015-04-08 DIAGNOSIS — N200 Calculus of kidney: Secondary | ICD-10-CM | POA: Diagnosis not present

## 2015-04-08 DIAGNOSIS — I481 Persistent atrial fibrillation: Secondary | ICD-10-CM | POA: Insufficient documentation

## 2015-04-08 DIAGNOSIS — Z7901 Long term (current) use of anticoagulants: Secondary | ICD-10-CM | POA: Diagnosis not present

## 2015-04-08 DIAGNOSIS — G4730 Sleep apnea, unspecified: Secondary | ICD-10-CM | POA: Insufficient documentation

## 2015-04-08 DIAGNOSIS — Z8249 Family history of ischemic heart disease and other diseases of the circulatory system: Secondary | ICD-10-CM | POA: Diagnosis not present

## 2015-04-08 DIAGNOSIS — E785 Hyperlipidemia, unspecified: Secondary | ICD-10-CM | POA: Insufficient documentation

## 2015-04-08 NOTE — Patient Instructions (Signed)
Continue uses cardizem as needed for breakthrough afib.  Call afib clinic as needed [phone]

## 2015-04-08 NOTE — Progress Notes (Signed)
Patient ID: NYSHAWN GOWDY, male   DOB: [birth year], 64 y.o.   MRN: [id number]     Primary Care Physician: [name], MD Cardiologist: Dr. Linard Millers Electrophysiologist: Dr. Marlis Edelson ANAS REISTER is a 64 y.o. male with a h/o persistent afib s/p ablation 4/28. He is having less afib burden and when he does have breakthrough afib can usually return to SR within 4 hiours after taking short acting cardizem. He was seen in the ER last week for a kidney stone, hopefully he can pass it without intervention. He is continuing on his Girard without missed doses and has not had any hematuria with this kidney stone. He did test positive for sleep apnea but could not tolerate mask during the sleep study and he is not interested in seeing any one in f/u for options of the face mask. No swallowing/groin issues following ablation.  Today, he denies symptoms of palpitations, chest pain, shortness of breath, orthopnea, PND, lower extremity edema, dizziness, presyncope, syncope, or neurologic sequela. The patient is tolerating medications without difficulties and is otherwise without complaint today.   Past Medical History  Diagnosis Date  . Hyperlipidemia   . GERD (gastroesophageal reflux disease)   . BPH (benign prostatic hyperplasia)   . Gout     denies  . Obesity   . Persistent atrial fibrillation     a. CHADsVASc score at least 2  . Aortic stenosis, mild   . Kidney stones   . DVT (deep venous thrombosis)   . Depression   . Hypertension     denies  . Glucose intolerance (impaired glucose tolerance)   . DJD (degenerative joint disease)    Past Surgical History  Procedure Laterality Date  . Colonoscopy  2010    MEDOFF  . Total knee arthroplasty Left 2001    MURPHY  . Tonsillectomy    . Joint replacement    . Knee arthroscopy Bilateral "multiple times"  . Cystoscopy w/ stone manipulation  1980's    "couldn't get stone so they had to cut me open"  . Kidney stone surgery  1980's   "stone lodged in the duct; had to cut me open to get it"  . Fracture surgery    . Forearm fracture surgery Right ~ 1961  . Cardiac catheterization  1990's    "Dr. Melvern Banker", no blockages per pt  . Cardioversion N/A 02/25/2015    Procedure: CARDIOVERSION;  Surgeon: Sueanne Margarita, MD;  Location: [facility] ENDOSCOPY;  Service: Cardiovascular;  Laterality: N/A;  . Atrial fibrillation ablation N/A 03/12/2015    Procedure: ATRIAL FIBRILLATION ABLATION;  Surgeon: [name], MD;  Location: [facility] CATH LAB;  Service: Cardiovascular;  Laterality: N/A;  . Tee without cardioversion N/A 03/12/2015    Procedure: TRANSESOPHAGEAL ECHOCARDIOGRAM (TEE);  Surgeon: Pixie Casino, MD;  Location: [facility] ENDOSCOPY;  Service: Cardiovascular;  Laterality: N/A;    Current Outpatient Prescriptions  Medication Sig Dispense Refill  . acetaminophen (TYLENOL) 500 MG tablet Take 500-1,000 mg by mouth every 6 (six) hours as needed for headache.    . albuterol (PROVENTIL HFA;VENTOLIN HFA) 108 (90 BASE) MCG/ACT inhaler Inhale 2 puffs into the lungs every 6 (six) hours as needed for wheezing or shortness of breath.    . allopurinol (ZYLOPRIM) 300 MG tablet Take 300 mg by mouth daily.      Marland Kitchen atorvastatin (LIPITOR) 20 MG tablet Take 1 tablet (20 mg total) by mouth daily at 6 PM. (Patient taking differently: Take 20 mg by mouth  at bedtime. ) 90 tablet 0  . buPROPion (WELLBUTRIN XL) 300 MG 24 hr tablet Take 1 tablet (300 mg total) by mouth daily. 90 tablet 1  . busPIRone (BUSPAR) 10 MG tablet Take 1 tablet (10 mg total) by mouth 3 (three) times daily. 60 tablet 5  . diltiazem (CARDIZEM CD) 180 MG 24 hr capsule Take 1 capsule (180 mg total) by mouth daily. 30 capsule 3  . diltiazem (CARDIZEM) 30 MG tablet Take one tablet every 4 - 6 hours as needed for breatkthrough AFib -- if heart rate is greater than 226 and systolic BP is greater than 110. 20 tablet 1  . ELIQUIS 5 MG TABS tablet TAKE 1 TABLET BY MOUTH TWICE DAILY 60 tablet 5  . fish  oil-omega-3 fatty acids 1000 MG capsule Take 1 g by mouth daily.     Marland Kitchen lisinopril-hydrochlorothiazide (PRINZIDE,ZESTORETIC) 10-12.5 MG per tablet Take 0.5 tablets by mouth daily.    Marland Kitchen loratadine (CLARITIN) 10 MG tablet Take 10 mg by mouth daily.    . mometasone (ASMANEX) 220 MCG/INH inhaler Inhale 2 puffs into the lungs 2 (two) times daily as needed (wheezing from bronchitis).     . Multiple Vitamins-Minerals (MULTIVITAMIN WITH MINERALS) tablet Take 1 tablet by mouth daily.      Marland Kitchen olopatadine (PATANOL) 0.1 % ophthalmic solution Place 1 drop into both eyes daily as needed (seasonal allergies).     Marland Kitchen omeprazole (PRILOSEC) 20 MG capsule Take 20 mg by mouth daily.    . ondansetron (ZOFRAN) 4 MG tablet Take 1 tablet (4 mg total) by mouth every 8 (eight) hours as needed for nausea or vomiting. 12 tablet 0  . Potassium Bicarb-Citric Acid 20 MEQ TBEF Take 20 mEq by mouth daily. Dissolve in water and drink    . silodosin (RAPAFLO) 4 MG CAPS capsule Take 4 mg by mouth at bedtime.     Marland Kitchen zolpidem (AMBIEN) 10 MG tablet Take 3.33-5 mg by mouth at bedtime. 1/3 to 1/2 tablet     No current facility-administered medications for this encounter.    Allergies  Allergen Reactions  . Codeine Itching  . Oxycodone Itching    History   Social History  . Marital Status: Married    Spouse Name: N/A  . Number of Children: N/A  . Years of Education: N/A   Occupational History  . Not on file.   Social History Main Topics  . Smoking status: Never Smoker   . Smokeless tobacco: Never Used  . Alcohol Use: Yes     Comment: 01/21/2015 "maybe 4-5 drinks/yr"  . Drug Use: No  . Sexual Activity: Yes   Other Topics Concern  . Not on file   Social History Narrative   Pt lives in [place] with spouse.  78 yo son is health.  Second son died in car accident.      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 Heart attack Mother 65    MI  . Heart attack Father 44     ? MI versus trauma to head  . Healthy Brother     ROS- All systems are reviewed and negative except as per the HPI above  Physical Exam: Filed Vitals:   04/08/15 1014  BP: 122/86  Pulse: 95  Height: 6' (1.829 m)  Weight: 231 lb 12.8 oz (105.144 kg)    GEN- The patient is well appearing, alert and oriented x 3 today.  Head- normocephalic, atraumatic Eyes-  Sclera clear, conjunctiva pink Ears- hearing intact Oropharynx- clear Neck- supple, no JVP Lymph- no cervical lymphadenopathy Lungs- Clear to ausculation bilaterally, normal work of breathing Heart- Regular rate and rhythm, no murmurs, rubs or gallops, PMI not laterally displaced GI- soft, NT, ND, + BS Extremities- no clubbing, cyanosis, or edema MS- no significant deformity or atrophy Skin- no rash or lesion Psych- euthymic mood, full affect Neuro- strength and sensation are intact  EKG-NSR, normal EKG 95 bpm, PR 158 ms, QRS 90 ms, QTc 444 ms.  Assessment and Plan: 1. Persistent afib  Doing well with less afib burden since ablation Continue Cardizem as needed Do not miss any blood thinner pills   F/u with Dr. [name] as scheduled Afib clinic as needed

## 2015-04-09 ENCOUNTER — Inpatient Hospital Stay (HOSPITAL_COMMUNITY)
Admission: RE | Admit: 2015-04-09 | Payer: BLUE CROSS/BLUE SHIELD | Source: Ambulatory Visit | Admitting: Nurse Practitioner

## 2015-04-20 ENCOUNTER — Telehealth: Payer: Self-pay | Admitting: Family Medicine

## 2015-04-20 ENCOUNTER — Other Ambulatory Visit: Payer: Self-pay

## 2015-04-20 MED ORDER — ZOLPIDEM TARTRATE 10 MG PO TABS: 3.3300 mg | ORAL_TABLET | Freq: Every day | ORAL | Status: DC

## 2015-04-20 NOTE — Telephone Encounter (Signed)
Pt requesting to speak to Dr Redmond School about his Afib. Pt did not want to go into detail.

## 2015-04-20 NOTE — Telephone Encounter (Signed)
Okay to renew

## 2015-04-20 NOTE — Telephone Encounter (Signed)
[facility] req Zolpidem 10 mg refill 90 days supply

## 2015-04-20 NOTE — Telephone Encounter (Signed)
Called in ambien 10 mg # 30/ 0 refills

## 2015-04-21 MED ORDER — ZOLPIDEM TARTRATE 10 MG PO TABS: 10.0000 mg | ORAL_TABLET | Freq: Once | ORAL | Status: DC

## 2015-04-21 NOTE — Telephone Encounter (Signed)
Called in another #60 to make it a 90 day supply. Pt picked up #30 yesterday

## 2015-04-22 ENCOUNTER — Encounter (HOSPITAL_COMMUNITY): Payer: Self-pay | Admitting: *Deleted

## 2015-04-22 ENCOUNTER — Emergency Department (HOSPITAL_COMMUNITY): Payer: BLUE CROSS/BLUE SHIELD

## 2015-04-22 ENCOUNTER — Emergency Department (HOSPITAL_COMMUNITY)
Admission: EM | Admit: 2015-04-22 | Discharge: 2015-04-22 | Disposition: A | Discharge status: Home / Self Care | Payer: BLUE CROSS/BLUE SHIELD | Attending: Emergency Medicine | Admitting: Emergency Medicine

## 2015-04-22 DIAGNOSIS — N202 Calculus of kidney with calculus of ureter: Secondary | ICD-10-CM | POA: Diagnosis present

## 2015-04-22 DIAGNOSIS — M1990 Unspecified osteoarthritis, unspecified site: Secondary | ICD-10-CM | POA: Diagnosis not present

## 2015-04-22 DIAGNOSIS — Z9889 Other specified postprocedural states: Secondary | ICD-10-CM | POA: Insufficient documentation

## 2015-04-22 DIAGNOSIS — H578 Other specified disorders of eye and adnexa: Secondary | ICD-10-CM | POA: Insufficient documentation

## 2015-04-22 DIAGNOSIS — E669 Obesity, unspecified: Secondary | ICD-10-CM | POA: Diagnosis not present

## 2015-04-22 DIAGNOSIS — E785 Hyperlipidemia, unspecified: Secondary | ICD-10-CM | POA: Diagnosis not present

## 2015-04-22 DIAGNOSIS — F329 Major depressive disorder, single episode, unspecified: Secondary | ICD-10-CM | POA: Diagnosis not present

## 2015-04-22 DIAGNOSIS — Z86718 Personal history of other venous thrombosis and embolism: Secondary | ICD-10-CM | POA: Diagnosis not present

## 2015-04-22 DIAGNOSIS — N132 Hydronephrosis with renal and ureteral calculous obstruction: Secondary | ICD-10-CM | POA: Diagnosis not present

## 2015-04-22 DIAGNOSIS — N201 Calculus of ureter: Secondary | ICD-10-CM

## 2015-04-22 DIAGNOSIS — Z7901 Long term (current) use of anticoagulants: Secondary | ICD-10-CM | POA: Diagnosis not present

## 2015-04-22 DIAGNOSIS — I10 Essential (primary) hypertension: Secondary | ICD-10-CM | POA: Insufficient documentation

## 2015-04-22 DIAGNOSIS — I481 Persistent atrial fibrillation: Secondary | ICD-10-CM | POA: Diagnosis not present

## 2015-04-22 DIAGNOSIS — R110 Nausea: Secondary | ICD-10-CM | POA: Diagnosis not present

## 2015-04-22 DIAGNOSIS — N200 Calculus of kidney: Secondary | ICD-10-CM

## 2015-04-22 DIAGNOSIS — K219 Gastro-esophageal reflux disease without esophagitis: Secondary | ICD-10-CM | POA: Insufficient documentation

## 2015-04-22 DIAGNOSIS — Z79899 Other long term (current) drug therapy: Secondary | ICD-10-CM | POA: Insufficient documentation

## 2015-04-22 LAB — CBC WITH DIFFERENTIAL/PLATELET
Basophils Absolute: 0 10*3/uL (ref 0.0–0.1)
Basophils Relative: 0 % (ref 0–1)
Eosinophils Absolute: 0.2 10*3/uL (ref 0.0–0.7)
Eosinophils Relative: 2 % (ref 0–5)
HCT: 40.8 % (ref 39.0–52.0)
Hemoglobin: 13.8 g/dL (ref 13.0–17.0)
Lymphocytes Relative: 17 % (ref 12–46)
Lymphs Abs: 1.2 10*3/uL (ref 0.7–4.0)
MCH: 31.4 pg (ref 26.0–34.0)
MCHC: 33.8 g/dL (ref 30.0–36.0)
MCV: 92.7 fL (ref 78.0–100.0)
Monocytes Absolute: 0.8 10*3/uL (ref 0.1–1.0)
Monocytes Relative: 11 % (ref 3–12)
Neutro Abs: 5.2 10*3/uL (ref 1.7–7.7)
Neutrophils Relative %: 70 % (ref 43–77)
Platelets: 221 10*3/uL (ref 150–400)
RBC: 4.4 MIL/uL (ref 4.22–5.81)
RDW: 12.7 % (ref 11.5–15.5)
WBC: 7.4 10*3/uL (ref 4.0–10.5)

## 2015-04-22 LAB — BASIC METABOLIC PANEL
Anion gap: 9 (ref 5–15)
BUN: 17 mg/dL (ref 6–20)
CO2: 26 mmol/L (ref 22–32)
Calcium: 8.7 mg/dL — ABNORMAL LOW (ref 8.9–10.3)
Chloride: 102 mmol/L (ref 101–111)
Creatinine, Ser: 0.65 mg/dL (ref 0.61–1.24)
GFR calc Af Amer: >60 mL/min (ref >60)
GFR calc non Af Amer: >60 mL/min (ref >60)
Glucose, Bld: 136 mg/dL — ABNORMAL HIGH (ref 65–99)
Potassium: 3.8 mmol/L (ref 3.5–5.1)
Sodium: 137 mmol/L (ref 135–145)

## 2015-04-22 MED ORDER — HYDROMORPHONE HCL 1 MG/ML IJ SOLN: 0.5000 mg | INTRAMUSCULAR | Status: DC | PRN

## 2015-04-22 MED ORDER — SODIUM CHLORIDE 0.9 % IV BOLUS (SEPSIS): 250.0000 mL | Freq: Once | INTRAVENOUS | Status: DC

## 2015-04-22 MED ORDER — SODIUM CHLORIDE 0.45 % IV SOLN
INTRAVENOUS | Status: DC
  Administered 2015-04-22: 07:00:00 via INTRAVENOUS

## 2015-04-22 MED ORDER — KETOROLAC TROMETHAMINE 30 MG/ML IJ SOLN: 30.0000 mg | Freq: Once | INTRAMUSCULAR | Status: DC

## 2015-04-22 MED ORDER — ONDANSETRON HCL 4 MG/2ML IJ SOLN: 4.0000 mg | Freq: Once | INTRAMUSCULAR | Status: DC

## 2015-04-22 MED ORDER — SODIUM CHLORIDE 0.9 % IV SOLN: INTRAVENOUS | Status: DC

## 2015-04-22 MED ORDER — ONDANSETRON HCL 4 MG/2ML IJ SOLN
4.0000 mg | Freq: Once | INTRAMUSCULAR | Status: AC
  Administered 2015-04-22: 4 mg via INTRAVENOUS
  Filled 2015-04-22: qty 2

## 2015-04-22 MED ORDER — KETOROLAC TROMETHAMINE 30 MG/ML IJ SOLN
30.0000 mg | Freq: Once | INTRAMUSCULAR | Status: AC
  Administered 2015-04-22: 30 mg via INTRAVENOUS
  Filled 2015-04-22: qty 1

## 2015-04-22 NOTE — ED Notes (Signed)
EDP ZACKOWSKI at bedside.

## 2015-04-22 NOTE — ED Notes (Signed)
Pt given breakfast tray

## 2015-04-22 NOTE — ED Provider Notes (Signed)
CSN: [id number]     Arrival date & time 04/22/15  9518 History   First MD Initiated Contact with Patient 04/22/15 [phone]     Chief Complaint  Patient presents with  . Nephrolithiasis     (Consider location/radiation/quality/duration/timing/severity/associated sxs/prior Treatment) The history is provided by the patient.   64 year old male with long-standing history of kidney stones. Patient with a known of left ureter 4 mm stone diagnosed a couple weeks ago. Patient was awoke this morning at 3 AM with severe pain to the left flank. Patient contacted his urologist recommended to come in. Patient does have hydromorphone at home. He did take that but it did not help the pain. Pain was 10 out of 10. Typical for his kidney stone pain. Patient the believes that he passed the previous stone. But is known to have more.  Past Medical History  Diagnosis Date  . Hyperlipidemia   . GERD (gastroesophageal reflux disease)   . BPH (benign prostatic hyperplasia)   . Gout     denies  . Obesity   . Persistent atrial fibrillation     a. CHADsVASc score at least 2  . Aortic stenosis, mild   . Kidney stones   . DVT (deep venous thrombosis)   . Depression   . Hypertension     denies  . Glucose intolerance (impaired glucose tolerance)   . DJD (degenerative joint disease)    Past Surgical History  Procedure Laterality Date  . Colonoscopy  2010    MEDOFF  . Total knee arthroplasty Left 2001    MURPHY  . Tonsillectomy    . Joint replacement    . Knee arthroscopy Bilateral "multiple times"  . Cystoscopy w/ stone manipulation  1980's    "couldn't get stone so they had to cut me open"  . Kidney stone surgery  1980's    "stone lodged in the duct; had to cut me open to get it"  . Fracture surgery    . Forearm fracture surgery Right ~ 1961  . Cardiac catheterization  1990's    "Dr. Melvern Banker", no blockages per pt  . Cardioversion N/A 02/25/2015    Procedure: CARDIOVERSION;  Surgeon: Sueanne Margarita, MD;   Location: [facility] ENDOSCOPY;  Service: Cardiovascular;  Laterality: N/A;  . Atrial fibrillation ablation N/A 03/12/2015    Procedure: ATRIAL FIBRILLATION ABLATION;  Surgeon: [name], MD;  Location: [facility] CATH LAB;  Service: Cardiovascular;  Laterality: N/A;  . Tee without cardioversion N/A 03/12/2015    Procedure: TRANSESOPHAGEAL ECHOCARDIOGRAM (TEE);  Surgeon: Pixie Casino, MD;  Location: [facility] ENDOSCOPY;  Service: Cardiovascular;  Laterality: N/A;   Family History  Problem Relation Age of Onset  . Heart attack Mother 45    MI  . Heart attack Father 71    ? MI versus trauma to head  . Healthy Brother    History  Substance Use Topics  . Smoking status: Never Smoker   . Smokeless tobacco: Never Used  . Alcohol Use: Yes     Comment: 01/21/2015 "maybe 4-5 drinks/yr"    Review of Systems  Constitutional: Negative for fever.  HENT: Negative for congestion.   Eyes: Positive for redness.  Respiratory: Negative for shortness of breath.   Cardiovascular: Negative for chest pain.  Gastrointestinal: Positive for nausea and abdominal pain.  Genitourinary: Positive for flank pain.  Musculoskeletal: Positive for back pain.  Skin: Negative for rash.  Neurological: Negative for headaches.  Hematological: Does not bruise/bleed easily.  Psychiatric/Behavioral: Negative for confusion.  Allergies  Codeine and Oxycodone  Home Medications   Prior to Admission medications   Medication Sig Start Date End Date Taking? Authorizing Provider  acetaminophen (TYLENOL) 500 MG tablet Take 500-1,000 mg by mouth every 6 (six) hours as needed for headache.   Yes Historical Provider, MD  albuterol (PROVENTIL HFA;VENTOLIN HFA) 108 (90 BASE) MCG/ACT inhaler Inhale 2 puffs into the lungs every 6 (six) hours as needed for wheezing or shortness of breath.   Yes Historical Provider, MD  allopurinol (ZYLOPRIM) 300 MG tablet Take 300 mg by mouth daily.     Yes Historical Provider, MD  AMMONIUM LACTATE EX Apply 1  application topically daily as needed (dry legs.).   Yes Historical Provider, MD  atorvastatin (LIPITOR) 20 MG tablet Take 1 tablet (20 mg total) by mouth daily at 6 PM. Patient taking differently: Take 20 mg by mouth at bedtime.  09/23/14  Yes Denita Lung, MD  buPROPion (WELLBUTRIN XL) 300 MG 24 hr tablet Take 1 tablet (300 mg total) by mouth daily. 09/23/14  Yes Denita Lung, MD  busPIRone (BUSPAR) 10 MG tablet Take 1 tablet (10 mg total) by mouth 3 (three) times daily. Patient taking differently: Take 10 mg by mouth 2 (two) times daily.  03/25/15  Yes Denita Lung, MD  diltiazem (CARDIZEM CD) 180 MG 24 hr capsule Take 1 capsule (180 mg total) by mouth daily. 02/24/15  Yes Sherran Needs, NP  diltiazem (CARDIZEM) 30 MG tablet Take one tablet every 4 - 6 hours as needed for breatkthrough AFib -- if heart rate is greater than 527 and systolic BP is greater than 110. 03/06/15  Yes Sherran Needs, NP  ELIQUIS 5 MG TABS tablet TAKE 1 TABLET BY MOUTH TWICE DAILY 03/25/15  Yes Belva Crome, MD  fish oil-omega-3 fatty acids 1000 MG capsule Take 1 g by mouth daily.    Yes Historical Provider, MD  lisinopril-hydrochlorothiazide (PRINZIDE,ZESTORETIC) 10-12.5 MG per tablet Take 0.5 tablets by mouth daily. 02/24/15  Yes Sherran Needs, NP  loratadine (CLARITIN) 10 MG tablet Take 10 mg by mouth daily.   Yes Historical Provider, MD  mometasone [facility]) 220 MCG/INH inhaler Inhale 2 puffs into the lungs 2 (two) times daily as needed (wheezing from bronchitis).    Yes Historical Provider, MD  Multiple Vitamins-Minerals (MULTIVITAMIN WITH MINERALS) tablet Take 1 tablet by mouth daily.     Yes Historical Provider, MD  olopatadine (PATANOL) 0.1 % ophthalmic solution Place 1 drop into both eyes daily as needed (seasonal allergies).    Yes Historical Provider, MD  omeprazole (PRILOSEC) 20 MG capsule Take 20 mg by mouth daily.   Yes Historical Provider, MD  Potassium Bicarb-Citric Acid 20 MEQ TBEF Take 20 mEq by  mouth daily. Dissolve in water and drink   Yes Historical Provider, MD  silodosin (RAPAFLO) 4 MG CAPS capsule Take 4 mg by mouth at bedtime.    Yes Historical Provider, MD  zolpidem (AMBIEN) 10 MG tablet Take 1 tablet (10 mg total) by mouth once. Patient taking differently: Take 10 mg by mouth at bedtime.  04/21/15  Yes Denita Lung, MD  ondansetron (ZOFRAN) 4 MG tablet Take 1 tablet (4 mg total) by mouth every 8 (eight) hours as needed for nausea or vomiting. Patient not taking: Reported on 04/22/2015 04/04/15   Dahlia Bailiff, PA-C   BP 139/80 mmHg  Pulse 85  Temp(Src) 98 F (36.7 C) (Oral)  Resp 16  Ht 6' (1.829 m)  [facility] [facility]  225 lb (102.059 kg)  BMI 30.51 kg/m2  SpO2 95% Physical Exam  Constitutional: He is oriented to person, place, and time. He appears well-developed and well-nourished. No distress.  HENT:  Head: Normocephalic and atraumatic.  Mouth/Throat: Oropharynx is clear and moist.  Eyes: Conjunctivae and EOM are normal. Pupils are equal, round, and reactive to light.  Neck: Normal range of motion.  Cardiovascular: Normal rate, regular rhythm and normal heart sounds.   Pulmonary/Chest: Effort normal and breath sounds normal. No respiratory distress.  Abdominal: Soft. Bowel sounds are normal. There is no tenderness.  Musculoskeletal: Normal range of motion.  Neurological: He is alert and oriented to person, place, and time. No cranial nerve deficit. He exhibits normal muscle tone. Coordination normal.  Skin: Skin is warm. No rash noted.  Nursing note and vitals reviewed.   ED Course  Procedures (including critical care time) Labs Review Labs Reviewed  BASIC METABOLIC PANEL - Abnormal; Notable for the following:    Glucose, Bld 136 (*)    Calcium 8.7 (*)    All other components within normal limits  CBC WITH DIFFERENTIAL/PLATELET    Imaging Review Ct Renal Stone Study  04/22/2015   CLINICAL DATA:  Left flank pain since 3 a.m. History kidney stones. Cystoscopy and stone removal  in 1980s. BPH. Hypertension.  EXAM: CT ABDOMEN AND PELVIS WITHOUT CONTRAST  TECHNIQUE: Multidetector CT imaging of the abdomen and pelvis was performed following the standard protocol without IV contrast.  COMPARISON:  04/04/2015  FINDINGS: Lower chest: 4 mm left lower lobe pulmonary nodule on image 5. 3 mm left lower lobe pulmonary nodule on image 7. These areas were not imaged on prior stone studies. Normal heart size without pericardial or pleural effusion. Fluid level in the thoracic esophagus.  Hepatobiliary: Normal liver. Normal gallbladder, without biliary ductal dilatation.  Pancreas: Normal, without mass or ductal dilatation.  Spleen: Normal  Adrenals/Urinary Tract: Normal adrenal glands. Exophytic upper pole right renal lesion measures 2.8 cm and fluid density, likely a cyst. Smaller fluid density lesions are also identified in the left kidney, most likely cysts.  Multiple bilateral renal collecting system calculi. Persistent mild left-sided hydroureteronephrosis. A distal left ureteric stone measures 4 mm on image 80 of series 2. This is positioned approximately 1 cm proximal to the left ureterovesicular junction stone described previously. No bladder calculi.  Stomach/Bowel: Normal colon and terminal ileum.  Normal small bowel.  Vascular/Lymphatic: Aortic and branch vessel atherosclerosis. No abdominopelvic adenopathy.  Reproductive: Mild prostatomegaly.  Other: No significant free fluid. Tiny fat containing periumbilical hernia. Bilateral tiny fat containing inguinal hernias.  Musculoskeletal: No acute osseous abnormality.  Disc bulge at L4-5.  IMPRESSION: 1. Left-sided hydroureteronephrosis secondary to a distal left ureteric 4 mm stone. The previous left ureterovesicular junction stone is absent. Therefore, this stone could have moved minimally proximally, or this could represent a new stone after passage of the UVJ calculus. 2. Bilateral nephrolithiasis. 3. Left lung base nodules up to 4 mm. If the  patient is at high risk for bronchogenic carcinoma, follow-up chest CT at 1 year is recommended. If the patient is at low risk, no follow-up is needed. This recommendation follows the consensus statement: "Guidelines for Management of Small Pulmonary Nodules Detected on CT Scans: A Statement from the [employer]" as published in Radiology 2005; 237:395-400. Available online at: https://www.arnold.com/. 4. Esophageal air fluid level suggests dysmotility or gastroesophageal reflux.   Electronically Signed   By: Abigail Miyamoto M.D.   On: 04/22/2015 07:51  EKG Interpretation None      MDM   Final diagnoses:  Left ureteral stone    CT findings discussed with patient's urologist Dr. Gaynelle Arabian. It appears that the 4 mm stone is moving more proximal. Dr. Rufina Falco mom says it's okay for patient to be discharged home since pain is under control and to call his office for follow-up later this week. Patient states pain is 1 out of 10 at discharge. Patient feeling much better. Renal function is normal.  Patient improved her significantly with Zofran hydromorphone and Toradol.  Fredia Sorrow, MD 04/22/15 [phone]

## 2015-04-22 NOTE — ED Notes (Signed)
Pt was sent here by Gaynelle Arabian, states has a hx of kidney stones, was diagnosed a couple weeks ago w/ one, states woke up around 3 am this morning w/ severe L flank pain. Did take dilaudid per Tannenbaum's order before leaving home.

## 2015-04-22 NOTE — ED Notes (Signed)
Patient transported to CT 

## 2015-04-22 NOTE — Discharge Instructions (Signed)
Call Dr. Arlyn Leak office today for follow-up later this week. Return for any new or worse symptoms. Take your pain medicine as directed.

## 2015-04-22 NOTE — ED Notes (Signed)
Bed: PQ24 Expected date:  Expected time:  Means of arrival:  Comments: Dr Gaynelle Arabian Patient

## 2015-04-22 NOTE — ED Notes (Signed)
Patient sent by Dr Gaynelle Arabian for kidney stone, patient with history of same. Verbal orders given by Dr Gaynelle Arabian. MD requesting to be notified with CT results are back, states he will come see patient if needed. Dr Gaynelle Arabian contact # [phone]

## 2015-04-23 ENCOUNTER — Encounter (HOSPITAL_COMMUNITY): Payer: Self-pay | Admitting: *Deleted

## 2015-04-23 ENCOUNTER — Other Ambulatory Visit: Payer: Self-pay | Admitting: Urology

## 2015-04-23 NOTE — Progress Notes (Signed)
Called requested orders be released to Epic sign and held for Same day surgery 04-24-15 Thanks

## 2015-04-24 ENCOUNTER — Ambulatory Visit (HOSPITAL_COMMUNITY): Payer: BLUE CROSS/BLUE SHIELD | Admitting: Certified Registered Nurse Anesthetist

## 2015-04-24 ENCOUNTER — Encounter (HOSPITAL_COMMUNITY): Payer: Self-pay | Admitting: Anesthesiology

## 2015-04-24 ENCOUNTER — Encounter (HOSPITAL_COMMUNITY)
Admission: RE | Disposition: A | Discharge status: Home / Self Care | Payer: Self-pay | Source: Ambulatory Visit | Attending: Urology

## 2015-04-24 ENCOUNTER — Ambulatory Visit (HOSPITAL_COMMUNITY)
Admission: RE | Admit: 2015-04-24 | Discharge: 2015-04-24 | Disposition: A | Discharge status: Home / Self Care | Payer: BLUE CROSS/BLUE SHIELD | Source: Ambulatory Visit | Attending: Urology | Admitting: Urology

## 2015-04-24 DIAGNOSIS — M1990 Unspecified osteoarthritis, unspecified site: Secondary | ICD-10-CM | POA: Diagnosis not present

## 2015-04-24 DIAGNOSIS — Z7901 Long term (current) use of anticoagulants: Secondary | ICD-10-CM | POA: Insufficient documentation

## 2015-04-24 DIAGNOSIS — R351 Nocturia: Secondary | ICD-10-CM | POA: Insufficient documentation

## 2015-04-24 DIAGNOSIS — N401 Enlarged prostate with lower urinary tract symptoms: Secondary | ICD-10-CM | POA: Diagnosis not present

## 2015-04-24 DIAGNOSIS — Z79899 Other long term (current) drug therapy: Secondary | ICD-10-CM | POA: Insufficient documentation

## 2015-04-24 DIAGNOSIS — I4891 Unspecified atrial fibrillation: Secondary | ICD-10-CM | POA: Insufficient documentation

## 2015-04-24 DIAGNOSIS — Z7951 Long term (current) use of inhaled steroids: Secondary | ICD-10-CM | POA: Diagnosis not present

## 2015-04-24 DIAGNOSIS — G4730 Sleep apnea, unspecified: Secondary | ICD-10-CM | POA: Diagnosis not present

## 2015-04-24 DIAGNOSIS — N202 Calculus of kidney with calculus of ureter: Secondary | ICD-10-CM | POA: Insufficient documentation

## 2015-04-24 DIAGNOSIS — R109 Unspecified abdominal pain: Secondary | ICD-10-CM | POA: Diagnosis present

## 2015-04-24 DIAGNOSIS — Z87442 Personal history of urinary calculi: Secondary | ICD-10-CM | POA: Insufficient documentation

## 2015-04-24 DIAGNOSIS — Z6830 Body mass index (BMI) 30.0-30.9, adult: Secondary | ICD-10-CM | POA: Diagnosis not present

## 2015-04-24 DIAGNOSIS — N201 Calculus of ureter: Secondary | ICD-10-CM

## 2015-04-24 DIAGNOSIS — I10 Essential (primary) hypertension: Secondary | ICD-10-CM | POA: Insufficient documentation

## 2015-04-24 DIAGNOSIS — E780 Pure hypercholesterolemia: Secondary | ICD-10-CM | POA: Diagnosis not present

## 2015-04-24 DIAGNOSIS — N23 Unspecified renal colic: Secondary | ICD-10-CM | POA: Diagnosis not present

## 2015-04-24 DIAGNOSIS — N138 Other obstructive and reflux uropathy: Secondary | ICD-10-CM | POA: Diagnosis not present

## 2015-04-24 DIAGNOSIS — N359 Urethral stricture, unspecified: Secondary | ICD-10-CM | POA: Diagnosis not present

## 2015-04-24 DIAGNOSIS — R350 Frequency of micturition: Secondary | ICD-10-CM | POA: Diagnosis not present

## 2015-04-24 DIAGNOSIS — N5201 Erectile dysfunction due to arterial insufficiency: Secondary | ICD-10-CM | POA: Diagnosis not present

## 2015-04-24 DIAGNOSIS — Z86718 Personal history of other venous thrombosis and embolism: Secondary | ICD-10-CM | POA: Diagnosis not present

## 2015-04-24 DIAGNOSIS — R3911 Hesitancy of micturition: Secondary | ICD-10-CM | POA: Insufficient documentation

## 2015-04-24 DIAGNOSIS — J45909 Unspecified asthma, uncomplicated: Secondary | ICD-10-CM | POA: Diagnosis not present

## 2015-04-24 DIAGNOSIS — K219 Gastro-esophageal reflux disease without esophagitis: Secondary | ICD-10-CM | POA: Diagnosis not present

## 2015-04-24 DIAGNOSIS — E669 Obesity, unspecified: Secondary | ICD-10-CM | POA: Diagnosis not present

## 2015-04-24 DIAGNOSIS — F329 Major depressive disorder, single episode, unspecified: Secondary | ICD-10-CM | POA: Insufficient documentation

## 2015-04-24 DIAGNOSIS — R3912 Poor urinary stream: Secondary | ICD-10-CM | POA: Diagnosis not present

## 2015-04-24 DIAGNOSIS — R3914 Feeling of incomplete bladder emptying: Secondary | ICD-10-CM | POA: Diagnosis not present

## 2015-04-24 DIAGNOSIS — R3915 Urgency of urination: Secondary | ICD-10-CM | POA: Insufficient documentation

## 2015-04-24 HISTORY — DX: Cardiac arrhythmia, unspecified: I49.9

## 2015-04-24 HISTORY — PX: HOLMIUM LASER APPLICATION: SHX5852

## 2015-04-24 HISTORY — PX: CYSTOSCOPY WITH RETROGRADE PYELOGRAM, URETEROSCOPY AND STENT PLACEMENT: SHX5789

## 2015-04-24 LAB — GLUCOSE, CAPILLARY: Glucose-Capillary: 126 mg/dL — ABNORMAL HIGH (ref 65–99)

## 2015-04-24 SURGERY — CYSTOURETEROSCOPY, WITH RETROGRADE PYELOGRAM AND STENT INSERTION
Anesthesia: General | Site: Ureter | Laterality: Left

## 2015-04-24 MED ORDER — PHENYLEPHRINE 40 MCG/ML (10ML) SYRINGE FOR IV PUSH (FOR BLOOD PRESSURE SUPPORT)
PREFILLED_SYRINGE | INTRAVENOUS | Status: AC
  Filled 2015-04-24: qty 10

## 2015-04-24 MED ORDER — PROMETHAZINE HCL 25 MG/ML IJ SOLN: 6.2500 mg | INTRAMUSCULAR | Status: DC | PRN

## 2015-04-24 MED ORDER — FENTANYL CITRATE (PF) 100 MCG/2ML IJ SOLN
INTRAMUSCULAR | Status: AC
  Filled 2015-04-24: qty 2

## 2015-04-24 MED ORDER — ONDANSETRON HCL 8 MG PO TABS: 8.0000 mg | ORAL_TABLET | Freq: Three times a day (TID) | ORAL | Status: DC | PRN

## 2015-04-24 MED ORDER — ARTIFICIAL TEARS OP OINT
TOPICAL_OINTMENT | OPHTHALMIC | Status: AC
  Filled 2015-04-24: qty 3.5

## 2015-04-24 MED ORDER — SODIUM CHLORIDE 0.9 % IR SOLN
Status: DC | PRN
  Administered 2015-04-24: 3000 mL

## 2015-04-24 MED ORDER — PROPOFOL 10 MG/ML IV BOLUS
INTRAVENOUS | Status: AC
  Filled 2015-04-24: qty 20

## 2015-04-24 MED ORDER — TRIMETHOPRIM 100 MG PO TABS: 100.0000 mg | ORAL_TABLET | ORAL | Status: DC

## 2015-04-24 MED ORDER — CEFAZOLIN SODIUM-DEXTROSE 2-3 GM-% IV SOLR
INTRAVENOUS | Status: AC
  Filled 2015-04-24: qty 50

## 2015-04-24 MED ORDER — KETOROLAC TROMETHAMINE 30 MG/ML IJ SOLN
INTRAMUSCULAR | Status: AC
  Filled 2015-04-24: qty 1

## 2015-04-24 MED ORDER — FENTANYL CITRATE (PF) 100 MCG/2ML IJ SOLN
INTRAMUSCULAR | Status: DC | PRN
  Administered 2015-04-24 (×4): 25 ug via INTRAVENOUS

## 2015-04-24 MED ORDER — URIBEL 118 MG PO CAPS: 1.0000 | ORAL_CAPSULE | Freq: Four times a day (QID) | ORAL | Status: DC | PRN

## 2015-04-24 MED ORDER — LIDOCAINE HCL (CARDIAC) 20 MG/ML IV SOLN
INTRAVENOUS | Status: AC
  Filled 2015-04-24: qty 5

## 2015-04-24 MED ORDER — HYDROMORPHONE HCL 8 MG PO TABS: 8.0000 mg | ORAL_TABLET | ORAL | Status: DC | PRN

## 2015-04-24 MED ORDER — PHENYLEPHRINE HCL 10 MG/ML IJ SOLN
INTRAMUSCULAR | Status: DC | PRN
  Administered 2015-04-24 (×2): 80 ug via INTRAVENOUS

## 2015-04-24 MED ORDER — FENTANYL CITRATE (PF) 100 MCG/2ML IJ SOLN: 25.0000 ug | INTRAMUSCULAR | Status: DC | PRN

## 2015-04-24 MED ORDER — ACETAMINOPHEN 10 MG/ML IV SOLN
1000.0000 mg | Freq: Once | INTRAVENOUS | Status: AC
  Administered 2015-04-24: 1000 mg via INTRAVENOUS

## 2015-04-24 MED ORDER — ACETAMINOPHEN 10 MG/ML IV SOLN
INTRAVENOUS | Status: AC
  Filled 2015-04-24: qty 100

## 2015-04-24 MED ORDER — LIDOCAINE HCL (CARDIAC) 20 MG/ML IV SOLN
INTRAVENOUS | Status: DC | PRN
  Administered 2015-04-24: 100 mg via INTRAVENOUS

## 2015-04-24 MED ORDER — MIDAZOLAM HCL 2 MG/2ML IJ SOLN
INTRAMUSCULAR | Status: AC
  Filled 2015-04-24: qty 2

## 2015-04-24 MED ORDER — PROPOFOL 10 MG/ML IV BOLUS
INTRAVENOUS | Status: DC | PRN
  Administered 2015-04-24: 200 mg via INTRAVENOUS

## 2015-04-24 MED ORDER — LIDOCAINE HCL 2 % EX GEL
CUTANEOUS | Status: DC | PRN
  Administered 2015-04-24: 1

## 2015-04-24 MED ORDER — IOHEXOL 300 MG/ML  SOLN
INTRAMUSCULAR | Status: DC | PRN
  Administered 2015-04-24: 8 mL via URETHRAL

## 2015-04-24 MED ORDER — LIDOCAINE HCL 2 % EX GEL
CUTANEOUS | Status: AC
  Filled 2015-04-24: qty 10

## 2015-04-24 MED ORDER — CEFAZOLIN SODIUM-DEXTROSE 2-3 GM-% IV SOLR
2.0000 g | INTRAVENOUS | Status: AC
  Administered 2015-04-24: 2 g via INTRAVENOUS

## 2015-04-24 MED ORDER — SODIUM CHLORIDE 0.9 % IR SOLN
Status: DC | PRN
  Administered 2015-04-24: 1000 mL

## 2015-04-24 MED ORDER — BELLADONNA ALKALOIDS-OPIUM 16.2-60 MG RE SUPP
RECTAL | Status: AC
  Filled 2015-04-24: qty 1

## 2015-04-24 MED ORDER — MIDAZOLAM HCL 5 MG/5ML IJ SOLN
INTRAMUSCULAR | Status: DC | PRN
  Administered 2015-04-24: 2 mg via INTRAVENOUS

## 2015-04-24 MED ORDER — ONDANSETRON HCL 4 MG/2ML IJ SOLN
INTRAMUSCULAR | Status: DC | PRN
  Administered 2015-04-24: 4 mg via INTRAVENOUS

## 2015-04-24 MED ORDER — KETOROLAC TROMETHAMINE 30 MG/ML IJ SOLN
INTRAMUSCULAR | Status: DC | PRN
  Administered 2015-04-24: 30 mg via INTRAVENOUS

## 2015-04-24 MED ORDER — LACTATED RINGERS IV SOLN
INTRAVENOUS | Status: DC | PRN
  Administered 2015-04-24: 10:00:00 via INTRAVENOUS

## 2015-04-24 MED ORDER — ONDANSETRON HCL 4 MG/2ML IJ SOLN
INTRAMUSCULAR | Status: AC
  Filled 2015-04-24: qty 2

## 2015-04-24 SURGICAL SUPPLY — 18 items
BAG URO CATCHER STRL LF (DRAPE) ×2 IMPLANT
BASKET ZERO TIP NITINOL 2.4FR (BASKET) ×2 IMPLANT
CATH INTERMIT  6FR 70CM (CATHETERS) ×2 IMPLANT
CLOTH BEACON ORANGE TIMEOUT ST (SAFETY) ×2 IMPLANT
EXTRACTOR STONE NITINOL NGAGE (UROLOGICAL SUPPLIES) ×2 IMPLANT
FIBER LASER TRAC TIP (UROLOGICAL SUPPLIES) ×2 IMPLANT
GLOVE BIOGEL M STRL SZ7.5 (GLOVE) ×2 IMPLANT
GOWN STRL REUS W/TWL XL LVL3 (GOWN DISPOSABLE) ×2 IMPLANT
GUIDEWIRE STR DUAL SENSOR (WIRE) ×4 IMPLANT
IV NS 1000ML (IV SOLUTION) ×2
IV NS 1000ML BAXH (IV SOLUTION) ×1 IMPLANT
IV NS IRRIG 3000ML ARTHROMATIC (IV SOLUTION) ×2 IMPLANT
MANIFOLD NEPTUNE II (INSTRUMENTS) ×2 IMPLANT
NS IRRIG 1000ML POUR BTL (IV SOLUTION) ×2 IMPLANT
PACK CYSTO (CUSTOM PROCEDURE TRAY) ×2 IMPLANT
SHEATH ACCESS URETERAL 38CM (SHEATH) ×2 IMPLANT
STENT CONTOUR 6FRX26X.038 (STENTS) ×2 IMPLANT
TUBING CONNECTING 10 (TUBING) ×2 IMPLANT

## 2015-04-24 NOTE — Progress Notes (Signed)
Paged Dr. Nelda Severe again

## 2015-04-24 NOTE — Transfer of Care (Signed)
Immediate Anesthesia Transfer of Care Note  Patient: Jeremy Tucker  Procedure(s) Performed: Procedure(s): URETHRAL MEATAL DILATION, CYSTOSCOPY WITH LEFT RETROGRADE PYELOGRAM, LEFT URETEROSCOPY, STONE BASKET EXTRACTION,  LEFT DOUBLE J STENT PLACEMENT (Left) HOLMIUM LASER APPLICATION (Left)  Patient Location: PACU  Anesthesia Type:General  Level of Consciousness:  sedated, patient cooperative and responds to stimulation  Airway & Oxygen Therapy:Patient Spontanous Breathing and Patient connected to face mask oxgen  Post-op Assessment:  Report given to PACU RN and Post -op Vital signs reviewed and stable  Post vital signs:  Reviewed and stable  Last Vitals:  Filed Vitals:   04/24/15 0748  BP: 133/78  Pulse: 98  Temp: 36.7 C  Resp: 18    Complications: No apparent anesthesia complications

## 2015-04-24 NOTE — Anesthesia Procedure Notes (Signed)
Procedure Name: LMA Insertion Date/Time: 04/24/2015 10:46 AM Performed by: Maxwell Caul Pre-anesthesia Checklist: Patient identified, Emergency Drugs available, Suction available and Patient being monitored Patient Re-evaluated:Patient Re-evaluated prior to inductionOxygen Delivery Method: Circle system utilized Preoxygenation: Pre-oxygenation with 100% oxygen Intubation Type: IV induction LMA: LMA inserted LMA Size: 5.0 Number of attempts: 1 Tube secured with: Tape Dental Injury: Teeth and Oropharynx as per pre-operative assessment

## 2015-04-24 NOTE — Anesthesia Preprocedure Evaluation (Signed)
Anesthesia Evaluation  Patient identified by MRN, date of birth, ID band Patient awake    Reviewed: Allergy & Precautions, NPO status , Patient's Chart, lab work & pertinent test results  Airway Mallampati: II  TM Distance: >3 FB Neck ROM: Full    Dental no notable dental hx.    Pulmonary asthma , sleep apnea ,  breath sounds clear to auscultation  Pulmonary exam normal       Cardiovascular hypertension, Pt. on medications Normal cardiovascular exam+ dysrhythmias Atrial Fibrillation + Valvular Problems/Murmurs AS Rhythm:Regular Rate:Normal  ECHO: EF normal. Moderate aortic stenosis   Neuro/Psych PSYCHIATRIC DISORDERS Depression negative neurological ROS     GI/Hepatic Neg liver ROS, GERD-  Medicated,  Endo/Other  negative endocrine ROS  Renal/GU Renal disease  negative genitourinary   Musculoskeletal  (+) Arthritis -,   Abdominal (+) + obese,   Peds negative pediatric ROS (+)  Hematology negative hematology ROS (+)   Anesthesia Other Findings   Reproductive/Obstetrics negative OB ROS                             Anesthesia Physical Anesthesia Plan  ASA: III  Anesthesia Plan: General   Post-op Pain Management:    Induction: Intravenous  Airway Management Planned: LMA  Additional Equipment:   Intra-op Plan:   Post-operative Plan: Extubation in OR  Informed Consent: I have reviewed the patients History and Physical, chart, labs and discussed the procedure including the risks, benefits and alternatives for the proposed anesthesia with the patient or authorized representative who has indicated his/her understanding and acceptance.   Dental advisory given  Plan Discussed with: CRNA  Anesthesia Plan Comments:         Anesthesia Quick Evaluation

## 2015-04-24 NOTE — H&P (Signed)
Reason For Visit F/u after being seen in the ER 04/22/15   Active Problems Problems  1. 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 Decreased libido (R68.82) 3. 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 Nephrolithiasis (N20.0) 5. Renal colic on left side (L46) 6. Urinary frequency (R35.0)  History of Present Illness 64 yo male returns today for f/u after being seen in the ER on 04/22/15. He awoke on 04/22/15 at 3am with severe Lt flank pain that was not relieved by Dilaudid. CT showed Lt hydronephrosis secondary to a 50m distal Lt ureteral stone. Previous 490mLt UVJ stone is not present. He thinks that he passed it.     Previously seen in the ER on 04/04/15 for Lt flank pain. CT showed a 65m50mbstructing Lt UVJ stone.     Hx of nephrolithiasis, treated with HCTZ, Potassium Citrate and Allopurinol. He also has a hx BPH on Rapaflo. He has previously failed Uroxatral. No new complaints. IPSS= 16 on Rapaflo/Cialis 5mg37my.     09/23/14 PSA - 3.55  09/25/13 PSA - 2.68  03/25/11 Testosterone - 480.07    04/20/10 PSA 1.81.   Past Medical History Problems  1. History of atrial fibrillation (Z86.79) 2. History of esophageal reflux (Z87.19) 3. History of hypercholesterolemia (Z86.39) 4. History of Venous Thrombosis  Surgical History Problems  1. History of Cardiac Catheter His Ablation 2. History of Kidney Surgery 3. History of Knee Surgery Left 4. History of Repair Of Esophageal Stricture  Current Meds 1. Albuterol Sulfate HFA 108 MCG/ACT AERS;  Therapy: (Recorded:24May2016) to Recorded 2. Allopurinol 300 MG Oral Tablet; Take 1 tablet by mouth every day;  Therapy: 26Feb2010 to (Evaluate:15Dec2016)  Requested for: 21Dec2015; Last  Rx:21Dec2015 Ordered 3. Asmanex HFA AERO;  Therapy: (Recorded:24May2016) to Recorded 4. BusPIRone HCl - 5 MG Oral Tablet;  Therapy: (Recorded:24May2016) to Recorded 5. Cardizem CD 180 MG Oral  Capsule Extended Release 24 Hour;  Therapy: (Recorded:24May2016) to Recorded 6. Cialis 5 MG Oral Tablet; TAKE AS DIRECTED;  Therapy: [id number](Evaluate:14Sep2013); Last Rx:18Mar2013 Ordered 7. Diltiazem HCl - 30 MG Oral Tablet;  Therapy: (Recorded:24May2016) to Recorded 8. Effer-K 20 MEQ Oral Tablet Effervescent; DISSOLVE 1 TABLET COMPLETELY IN 3 TO 4  OUNCES OF COLD OR ICE WATER AND DRINK TWICE DAILY;  Therapy: [id number](Evaluate:14Feb2016); Last Rx:19Feb2015 Ordered 9. Eliquis 5 MG Oral Tablet;  Therapy: (Recorded:24May2016) to Recorded 10. Lipitor 20 MG Oral Tablet;   Therapy: (Recorded:24May2016) to Recorded 11. Lisinopril-Hydrochlorothiazide 10-12.5 MG Oral Tablet;   Therapy: [id number] 12. Loratadine 10 MG Oral Tablet;   Therapy: (Recorded:24May2016) to Recorded 13. Multiple Vitamin TABS;   Therapy: (Recorded:11Feb2008) to Recorded 14. Olopatadine HCl - 0.1 % Ophthalmic Solution;   Therapy: (Recorded:24May2016) to Recorded 15. Omega-3 CAPS;   Therapy: (Recorded:11Feb2008) to Recorded 16. Omeprazole 20 MG Oral Capsule Delayed Release;   Therapy: (Recorded:24May2016) to Recorded 17. Rapaflo 8 MG Oral Capsule; TAKE 1 CAPSULE Daily;   Therapy: [id number](Evaluate:23Dec2014); Last Rx:11Nov2014 Ordered 18. Tylenol 500 MG CAPS;   Therapy: (Recorded:24May2016) to Recorded 19. Wellbutrin XL 300 MG Oral Tablet Extended Release 24 Hour;   Therapy: 20Aug2007 to Recorded 20. Zolpidem Tartrate 10 MG Oral Tablet;   Therapy: [id number]  Allergies Medication  1. OxyCODONE HCl TABS  Family History Problems  1. Family history of Death In The Family Child : Son 2. Family history of Family Health Status Number Of Children   1 son  Social History Problems  1. Denied:  Alcohol Use 2. Marital History - Currently Married 3. Never A Smoker 4. Occupation:   Microbiologist 5. Denied: Tobacco Use  Review of Systems  Genitourinary: urinary  frequency, feelings of urinary urgency, nocturia, difficulty starting the urinary stream, weak urinary stream, urinary stream starts and stops, incomplete emptying of bladder and post-void dribbling, but no dysuria and no incontinence.    Vitals Vital Signs [Data Includes: Last 1 Day]  Recorded: [id number] 10:28AM  Blood Pressure: 115 / 76 Temperature: 97.5 F Heart Rate: 87  Physical Exam Constitutional: Well nourished and well developed . No acute distress.  ENT:. The ears and nose are normal in appearance.  Neck: The appearance of the neck is normal and no neck mass is present.  Pulmonary: No respiratory distress and normal respiratory rhythm and effort.  Cardiovascular: Heart rate and rhythm are normal . No peripheral edema.  Abdomen: The abdomen is soft and nontender. No masses are palpated. No CVA tenderness. No hernias are palpable. No hepatosplenomegaly noted.  Genitourinary: Examination of the penis demonstrates no discharge, no masses, no lesions and a normal meatus. The scrotum is without lesions. The right epididymis is palpably normal and non-tender. The left epididymis is palpably normal and non-tender. The right testis is non-tender and without masses. The left testis is non-tender and without masses.  Lymphatics: The femoral and inguinal nodes are not enlarged or tender.  Skin: Normal skin turgor, no visible rash and no visible skin lesions.  Neuro/Psych:. Mood and affect are appropriate.    Results/Data  Selected Results  CREATININE with eGFR [id number] 12:15PM Carolan Clines  SPECIMEN TYPE: BLOOD   Test Name Result Flag Reference  CREATININE 0.77 mg/dL  0.50-1.50  Est GFR, African American >89 mL/min    Est GFR, NonAfrican American >89 mL/min    THE ESTIMATED GFR IS A CALCULATION VALID FOR ADULTS (>=36 YEARS OLD) THAT USES THE CKD-EPI ALGORITHM TO ADJUST FOR AGE AND SEX. IT IS   NOT TO BE USED FOR CHILDREN, PREGNANT WOMEN, HOSPITALIZED PATIENTS,    PATIENTS ON  DIALYSIS, OR WITH RAPIDLY CHANGING KIDNEY FUNCTION. ACCORDING TO THE NKDEP, EGFR >89 IS NORMAL, 60-89 SHOWS MILD IMPAIRMENT, 30-59 SHOWS MODERATE IMPAIRMENT, 15-29 SHOWS SEVERE IMPAIRMENT AND <15 IS ESRD.    23 Apr 2015 10:05 AM  UA With REFLEX    COLOR YELLOW     APPEARANCE CLEAR     SPECIFIC GRAVITY 1.020     pH 5.5     GLUCOSE NEG     BILIRUBIN NEG     KETONE NEG     BLOOD SMALL     PROTEIN NEG     UROBILINOGEN 0.2     NITRITE NEG     LEUKOCYTE ESTERASE NEG     SQUAMOUS EPITHELIAL/HPF NONE SEEN     WBC 0-2     CRYSTALS NONE SEEN     CASTS NONE SEEN     RBC 0-2     BACTERIA NONE SEEN  Assessment Assessed  1. Nephrolithiasis (N20.0) 2. Renal colic on left side (D74)  L renal stone, with continued colic. We have discussed the alternatives, and we will continue with plans for basket extraction.    Plan Benign localized hyperplasia of prostate with urinary obstruction  1. Litholink Stone Risk-Urine; Status:Hold For - [employer]; Requested  JOI:[id number];  Renal colic on left side  2. Start: HYDROmorphone HCl - 4 MG Oral Tablet; TAKE 1 TABLET EVERY 4 HOURS AS  NEEDED FOR PAIN  For OR  in AM.   Signatures Electronically signed by : Carolan Clines, M.D.; Apr 23 2015 11:45AM EST

## 2015-04-24 NOTE — Interval H&P Note (Signed)
History and Physical Interval Note:  04/24/2015 10:37 AM  Jeremy Tucker  has presented today for surgery, with the diagnosis of LEFT URETERAL STONE   The various methods of treatment have been discussed with the patient and family. After consideration of risks, benefits and other options for treatment, the patient has consented to  Procedure(s): CYSTOSCOPY WITH LEFT RETROGRADE PYELOGRAM, URETEROSCOPY ,  DOUBLE J STENT PLACEMENT (Left) HOLMIUM LASER, STONE BASKET EXTRACTION (Left) as a surgical intervention .  The patient's history has been reviewed, patient examined, no change in status, stable for surgery.  I have reviewed the patient's chart and labs.  Questions were answered to the patient's satisfaction.     SIGMUND I TANNENBAUM

## 2015-04-24 NOTE — Discharge Instructions (Addendum)
Dietary Guidelines to Help Prevent Kidney Stones Your risk of kidney stones can be decreased by adjusting the foods you eat. The most important thing you can do is drink enough fluid. You should drink enough fluid to keep your urine clear or pale yellow. The following guidelines provide specific information for the type of kidney stone you have had. GUIDELINES ACCORDING TO TYPE OF KIDNEY STONE Calcium Oxalate Kidney Stones  Reduce the amount of salt you eat. Foods that have a lot of salt cause your body to release excess calcium into your urine. The excess calcium can combine with a substance called oxalate to form kidney stones.  Reduce the amount of animal protein you eat if the amount you eat is excessive. Animal protein causes your body to release excess calcium into your urine. Ask your dietitian how much protein from animal sources you should be eating.  Avoid foods that are high in oxalates. If you take vitamins, they should have less than 500 mg of vitamin C. Your body turns vitamin C into oxalates. You do not need to avoid fruits and vegetables high in vitamin C. Calcium Phosphate Kidney Stones  Reduce the amount of salt you eat to help prevent the release of excess calcium into your urine.  Reduce the amount of animal protein you eat if the amount you eat is excessive. Animal protein causes your body to release excess calcium into your urine. Ask your dietitian how much protein from animal sources you should be eating.  Get enough calcium from food or take a calcium supplement (ask your dietitian for recommendations). Food sources of calcium that do not increase your risk of kidney stones include:  Broccoli.  Dairy products, such as cheese and yogurt.  Pudding. Uric Acid Kidney Stones  Do not have more than 6 oz of animal protein per day. FOOD SOURCES Animal Protein Sources  Meat (all types).  Poultry.  Eggs.  Fish, seafood. Foods High in [employer] seasonings.  Soy  sauce.  Teriyaki sauce.  Cured and processed meats.  Salted crackers and snack foods.  Fast food.  Canned soups and most canned foods. Foods High in Oxalates  Grains:  Amaranth.  Barley.  Grits.  Wheat germ.  Bran.  Buckwheat flour.  All bran cereals.  Pretzels.  Whole wheat bread.  Vegetables:  Beans (wax).  Beets and beet greens.  Collard greens.  Eggplant.  [place]ole.  Leeks.  Okra.  Parsley.  Rutabagas.  Spinach.  Swiss chard.  Tomato paste.  Fried potatoes.  Sweet potatoes.  Fruits:  Red currants.  Figs.  Kiwi.  Rhubarb.  Meat and Other Protein Sources:  Beans (dried).  Soy burgers and other soybean products.  Miso.  Nuts (peanuts, almonds, pecans, cashews, hazelnuts).  Nut butters.  Sesame seeds and tahini (paste made of sesame seeds).  Poppy seeds.  Beverages:  Chocolate drink mixes.  Soy milk.  Instant iced tea.  Juices made from high-oxalate fruits or vegetables.  Other:  Carob.  Chocolate.  Fruitcake.  Marmalades. Document Released: 02/25/2011 Document Revised: 11/05/2013 Document Reviewed: 09/27/2013 [facility] Patient Information 2015 [place], [place]. This information is not intended to replace advice given to you by your health care provider. Make sure you discuss any questions you have with your health care provider.    General Anesthesia, Care After Refer to this sheet in the next few weeks. These instructions provide you with information on caring for yourself after your procedure. Your health care provider may also give you  more specific instructions. Your treatment has been planned according to current medical practices, but problems sometimes occur. Call your health care provider if you have any problems or questions after your procedure. WHAT TO EXPECT AFTER THE PROCEDURE After the procedure, it is typical to experience: Sleepiness. Nausea and vomiting. HOME CARE INSTRUCTIONS For  the first 24 hours after general anesthesia: Have a responsible person with you. Do not drive a car. If you are alone, do not take public transportation. Do not drink alcohol. Do not take medicine that has not been prescribed by your health care provider. Do not sign important papers or make important decisions. You may resume a normal diet and activities as directed by your health care provider. Change bandages (dressings) as directed. If you have questions or problems that seem related to general anesthesia, call the hospital and ask for the anesthetist or anesthesiologist on call. SEEK MEDICAL CARE IF: You have nausea and vomiting that continue the day after anesthesia. You develop a rash. SEEK IMMEDIATE MEDICAL CARE IF:  You have difficulty breathing. You have chest pain. You have any allergic problems. Document Released: 02/06/2001 Document Revised: 11/05/2013 Document Reviewed: 05/16/2013 [facility] Patient Information 2015 [place], [place]. This information is not intended to replace advice given to you by your health care provider. Make sure you discuss any questions you have with your health care provider. Ureteral Stent Implantation, Care After Refer to this sheet in the next few weeks. These instructions provide you with information on caring for yourself after your procedure. Your health care provider may also give you more specific instructions. Your treatment has been planned according to current medical practices, but problems sometimes occur. Call your health care provider if you have any problems or questions after your procedure. WHAT TO EXPECT AFTER THE PROCEDURE You should be back to normal activity within 48 hours after the procedure. Nausea and vomiting may occur and are commonly the result of anesthesia. It is common to experience sharp pain in the back or lower abdomen and penis with voiding. This is caused by movement of the ends of the stent with the act of urinating.It  usually goes away within minutes after you have stopped urinating. HOME CARE INSTRUCTIONS Make sure to drink plenty of fluids. You may have small amounts of bleeding, causing your urine to be red. This is normal. Certain movements may trigger pain or a feeling that you need to urinate. You may be given medicines to prevent infection or bladder spasms. Be sure to take all medicines as directed. Only take over-the-counter or prescription medicines for pain, discomfort, or fever as directed by your health care provider. Do not take aspirin, as this can make bleeding worse. Your stent will be left in until the blockage is resolved. This may take 2 weeks or longer, depending on the reason for stent implantation. You may have an X-ray exam to make sure your ureter is open and that the stent has not moved out of position (migrated). The stent can be removed by your health care provider in the office. Medicines may be given for comfort while the stent is being removed. Be sure to keep all follow-up appointments so your health care provider can check that you are healing properly. SEEK MEDICAL CARE IF: You experience increasing pain. Your pain medicine is not working. SEEK IMMEDIATE MEDICAL CARE IF: Your urine is dark red or has blood clots. You are leaking urine (incontinent). You have a fever, chills, feeling sick to your stomach (nausea),  or vomiting. Your pain is not relieved by pain medicine. The end of the stent comes out of the urethra. You are unable to urinate. Document Released: 07/03/2013 Document Revised: 11/05/2013 Document Reviewed: 07/03/2013 [facility] Patient Information 2015 [place], [place]. This information is not intended to replace advice given to you by your health care provider. Make sure you discuss any questions you have with your health care provider.

## 2015-04-24 NOTE — Anesthesia Postprocedure Evaluation (Signed)
  Anesthesia Post-op Note  Patient: Jeremy Tucker  Procedure(s) Performed: Procedure(s) (LRB): URETHRAL MEATAL DILATION, CYSTOSCOPY WITH LEFT RETROGRADE PYELOGRAM, LEFT URETEROSCOPY, STONE BASKET EXTRACTION,  LEFT DOUBLE J STENT PLACEMENT (Left) HOLMIUM LASER APPLICATION (Left)  Patient Location: PACU  Anesthesia Type: General  Level of Consciousness: awake and alert   Airway and Oxygen Therapy: Patient Spontanous Breathing  Post-op Pain: mild  Post-op Assessment: Post-op Vital signs reviewed, Patient's Cardiovascular Status Stable, Respiratory Function Stable, Patent Airway and No signs of Nausea or vomiting  Last Vitals:  Filed Vitals:   04/24/15 1311  BP: 131/79  Pulse: 90  Temp: 36.5 C  Resp: 18    Post-op Vital Signs: stable   Complications: No apparent anesthesia complications

## 2015-04-24 NOTE — Progress Notes (Addendum)
Called doctor's office and paged Dr. Gaynelle Arabian to get orders signed in [facility]. Notified OR Magda Paganini that no orders for patient. Spoke to [employer].

## 2015-04-24 NOTE — Op Note (Signed)
Pre-operative diagnosis :  Left mid ureteral stone  Postoperative diagnosis: Meatal stenosis,  Left upper uretral stone, plus multiple renal calyceal stones  Operation:  Needle dilation; cystourethroscopy, left retrograde pyelogram interpretation, semirigid ureteroscopy, flexible ureteroscopy, pyeloscopy, extraction of 4 mm left UPJ stone, and laser fractionation of 6 mm left lower pole calyceal stone. Insertion of 6 French by 26 cm left double-J catheter (no suture).  Surgeon:  Chauncey Cruel. Gaynelle Arabian, MD  First assistant:  None  Anesthesia:  Gen. LMA}  Preparation:  After appropriate preanesthesia, the patient was brought the operating, placed on the operating table in the dorsal supine position where general LMA anesthesia was introduced. Using replaced in the dorsal lithotomy position with the penis was prepped with Betadine solution and draped in usual fashion. The arm band was double checked. The history was double-J. CT scan was reviewed previously.  Review history:  Active Problems: Patient currently has a recent history of atrial fibrillation, treated with Jennye Moccasin after cardiac ablation. Problems  1. 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 Decreased libido (R68.82) 3. 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 Nephrolithiasis (N20.0) 5. Renal colic on left side (V25) 6. Urinary frequency (R35.0)  History of Present Illness 64 yo male returns today for f/u after being seen in the ER on 04/22/15. He awoke on 04/22/15 at 3am with severe Lt flank pain that was not relieved by Dilaudid. CT showed Lt hydronephrosis secondary to a 19mm distal Lt ureteral stone. Previous 75mm Lt UVJ stone is not present. He thinks that he passed it.     Previously seen in the ER on 04/04/15 for Lt flank pain. CT showed a 1mm obstructing Lt UVJ stone.     Hx of nephrolithiasis, treated with HCTZ, Potassium Citrate and Allopurinol. He also has a hx BPH on  Rapaflo. He has previously failed Uroxatral. No new complaints. IPSS= 16 on Rapaflo/Cialis 5mg /day.    Statement of  Likelihood of Success: Excellent. TIME-OUT observed.:  Procedure:  The urethral meatus is identified, but would not allow the cystoscope to be passed through the meatus. The urethral meatus was therefore dilated to a size 63 [place] with male sounds without trauma. The cystoscope was then passed easily through the urethra, which was otherwise within normal limits. There was no evidence of stricture within the urethra. The prostatic fossa was normal, with 3+ benign prostate, with mild hypertrophy of the prostatic lobes. The bladder neck was not elevated. The bladder itself showed no trabeculation, no psoas, no evidence of bladder stone or tumor formation. There is no diverticular formation. A left retrograde Pyelogram was then performed through a 6 open-ended catheter, easily passed into the left ureteral orifice,, which showed a normal-appearing ureter without hydronephrosis. Stone was not identified within the left ureter. A 0.038 guidewire was then passed through the open-ended catheter into the renal pelvis. The double channel 6 French short ureteroscope was then passed through the urethra, and the prostate, and into the bladder without difficulty. The ureteroscope was then passed into the left ureter, and the entire lower ureter was ureteroscoped, without evidence of stone. There was no bleeding noted. A  second guidewire was then passed into the renal pelvis.   The ureteroscope was removed, and the medium ureteral dilating sheath was passed across one of the guidewires, through the urethra, and into the bladder, and up the lower ureter into the mid ureter. The digital flexor ureteroscope was then passed without trauma into the upper ureter. No stone was  identified. The scope was then passed into the renal pelvis, where clot was identified. Stone was seen at the UPJ and the renal pelvis, and  this was basket extracted. This was consistent with a stone seen on CT. Further pyeloscopy revealed stone in the lower pole calyx, and this was attempted to be extracted, but was too big a stone to be brought through the ureter. Therefore, the laser fiber was then passed through the scope, and using laser settings of 0.5/5 -1/10, the stone was fragmented, and removed. Repeat pyeloscopy with attempt, but the second stone could not be dislodged from the calyx, for fear of traumatizing the calyx, and creating blue more bleeding. Therefore, the scope was removed, and a 6 [place] by 26 cm double-J stent was passed and coiled into the renal pelvis, and the bladder. The bladder was drained of fluid. The patient had some bleeding from the urethral meatal dilation, and this was dressed with some pressure. Xylocaine jelly was placed in the urethra. The patient was given IV Toradol and IV Tylenol. He was not given a Beano suppository because of intolerance to codeine.  He is awakened, taken to recovery room in good condition. He remained in normal sinus rhythm for the entire procedure.

## 2015-04-27 ENCOUNTER — Encounter (HOSPITAL_COMMUNITY): Payer: Self-pay | Admitting: Urology

## 2015-05-04 ENCOUNTER — Other Ambulatory Visit: Payer: Self-pay | Admitting: Family Medicine

## 2015-05-07 ENCOUNTER — Other Ambulatory Visit (HOSPITAL_COMMUNITY): Payer: BC Managed Care – PPO

## 2015-05-07 ENCOUNTER — Other Ambulatory Visit: Payer: Self-pay

## 2015-05-07 ENCOUNTER — Telehealth: Payer: Self-pay | Admitting: Family Medicine

## 2015-05-07 MED ORDER — LORAZEPAM 0.5 MG PO TABS: 0.5000 mg | ORAL_TABLET | Freq: Two times a day (BID) | ORAL | Status: DC | PRN

## 2015-05-07 NOTE — Telephone Encounter (Signed)
done

## 2015-05-07 NOTE — Telephone Encounter (Signed)
Call in the Ativan. 

## 2015-05-07 NOTE — Telephone Encounter (Signed)
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 He is using his Ambien regularly and did find occasionally use of Ativan on top of BuSpar was helpful for stressful situations. I cautioned him on the use of these medications to regularly. He is aware of this and I will monitor the situation.

## 2015-05-07 NOTE — Telephone Encounter (Signed)
Pt requesting to speak to Dr Redmond School about something and he did not want to go in to detail about what's going on. He can be reached on work # [phone]

## 2015-05-08 ENCOUNTER — Encounter (HOSPITAL_BASED_OUTPATIENT_CLINIC_OR_DEPARTMENT_OTHER): Payer: BLUE CROSS/BLUE SHIELD

## 2015-05-15 ENCOUNTER — Telehealth: Payer: Self-pay | Admitting: Internal Medicine

## 2015-05-15 NOTE — Telephone Encounter (Signed)
Pt called stating that he is not feeling well. His Afib has been out of wack and heart rate is over 100, and he is suppose to be flying on Thursday but since he doesn't feel well he doesn't want to fly but he is needing a note as soon as possible stating that it is not best to flying under his conditions so he can get his money back for his flight. Pt is aware that Dr. Redmond School is not here today but really needs this letter so he can send to [facility]. Please fax letter to [phone] and please notify pt @ [phone]. Dr. Redmond School has written a letter for him before for this.

## 2015-05-15 NOTE — Telephone Encounter (Signed)
Pt notified and will try to contact his cardiologist

## 2015-05-15 NOTE — Telephone Encounter (Signed)
I recommend he contact his cardiologist for letter, and consider seeing them

## 2015-05-19 ENCOUNTER — Other Ambulatory Visit: Payer: Self-pay | Admitting: Family Medicine

## 2015-05-19 ENCOUNTER — Encounter: Payer: Self-pay | Admitting: *Deleted

## 2015-05-19 ENCOUNTER — Telehealth: Payer: Self-pay | Admitting: Interventional Cardiology

## 2015-05-19 NOTE — Telephone Encounter (Signed)
Called patient to review contents of letter. He states he prefers if letter references his symptomatic atrial fibrillation. Letter edited and faxed.

## 2015-05-19 NOTE — Telephone Encounter (Signed)
New message    Patient calling C/O yesterday was a bad day with Afib . Patient is schedule to be out of town Thursday but cancel his flight.  he is scared to fly.    Need a note to give to [employer].

## 2015-05-19 NOTE — Telephone Encounter (Signed)
Patient states that he was recently ablated by Dr. [name] and he knows what to do for his symptomatic AFib episodes. He just needs a letter faxed to him at [phone] (fax) to send to [employer] to explain why he needed to cancel his flight this Thursday.  Notified Dr. Tamala Julian via phone to review this request. Dr. Tamala Julian agreed with request. Letter completed, filed, faxed and mailed to patient.

## 2015-05-25 ENCOUNTER — Other Ambulatory Visit: Payer: Self-pay

## 2015-05-25 ENCOUNTER — Ambulatory Visit (INDEPENDENT_AMBULATORY_CARE_PROVIDER_SITE_OTHER): Payer: BLUE CROSS/BLUE SHIELD | Admitting: Internal Medicine

## 2015-05-25 ENCOUNTER — Encounter: Payer: Self-pay | Admitting: Internal Medicine

## 2015-05-25 VITALS — BP 126/80 | HR 95 | Ht 72.0 in | Wt 226.8 lb

## 2015-05-25 DIAGNOSIS — E669 Obesity, unspecified: Secondary | ICD-10-CM | POA: Diagnosis not present

## 2015-05-25 DIAGNOSIS — G4733 Obstructive sleep apnea (adult) (pediatric): Secondary | ICD-10-CM | POA: Diagnosis not present

## 2015-05-25 DIAGNOSIS — I480 Paroxysmal atrial fibrillation: Secondary | ICD-10-CM | POA: Diagnosis not present

## 2015-05-25 DIAGNOSIS — E1159 Type 2 diabetes mellitus with other circulatory complications: Secondary | ICD-10-CM | POA: Insufficient documentation

## 2015-05-25 DIAGNOSIS — I10 Essential (primary) hypertension: Secondary | ICD-10-CM | POA: Insufficient documentation

## 2015-05-25 DIAGNOSIS — I152 Hypertension secondary to endocrine disorders: Secondary | ICD-10-CM | POA: Insufficient documentation

## 2015-05-25 HISTORY — DX: Type 2 diabetes mellitus with other circulatory complications: E11.59

## 2015-05-25 NOTE — Progress Notes (Signed)
Electrophysiology Office Note   Date:  05/25/2015   ID:  DOMONIC Tucker, DOB [birth year], MRN [id number]  PCP:  [name], MD  Cardiologist:  Dr Tamala Julian Primary Electrophysiologist: [name], MD    Chief Complaint  Patient presents with  . Perisistent AFIB     History of Present Illness: Jeremy Tucker is a 64 y.o. male who presents today for electrophysiology evaluation.   Since his recent AF ablation he has done well.  He has not had procedure related complications.  He carries a lot of work related stress and hopes that this will be better with upcoming retirement. He has noticed his heart rates 90s-100 but is not sure that he has had afib. He feels tired at times.  He has moderate sleep apnea by recent sleep study but does not tolerate CPAP.  Today, he denies symptoms of chest pain,  orthopnea, PND, lower extremity edema, claudication, dizziness, presyncope, syncope, bleeding, or neurologic sequela. The patient is tolerating medications without difficulties and is otherwise without complaint today.    Past Medical History  Diagnosis Date  . Hyperlipidemia   . GERD (gastroesophageal reflux disease)   . BPH (benign prostatic hyperplasia)   . Gout     denies  . Obesity   . Persistent atrial fibrillation     a. CHADsVASc score at least 2  . Aortic stenosis, mild   . Kidney stones   . DVT (deep venous thrombosis)   . Depression   . Hypertension     denies  . Glucose intolerance (impaired glucose tolerance)   . DJD (degenerative joint disease)   . Dysrhythmia     Atrial fibrillation   Past Surgical History  Procedure Laterality Date  . Colonoscopy  2010    MEDOFF  . Total knee arthroplasty Left 2001    MURPHY  . Tonsillectomy    . Joint replacement    . Knee arthroscopy Bilateral "multiple times"  . Cystoscopy w/ stone manipulation  1980's    "couldn't get stone so they had to cut me open"  . Kidney stone surgery  1980's    "stone lodged in the duct;  had to cut me open to get it"  . Fracture surgery    . Forearm fracture surgery Right ~ 1961  . Cardiac catheterization  1990's    "Dr. Melvern Banker", no blockages per pt  . Cardioversion N/A 02/25/2015    Procedure: CARDIOVERSION;  Surgeon: Sueanne Margarita, MD;  Location: [facility] ENDOSCOPY;  Service: Cardiovascular;  Laterality: N/A;  . Atrial fibrillation ablation N/A 03/12/2015    Procedure: ATRIAL FIBRILLATION ABLATION;  Surgeon: [name], MD;  Location: [facility] CATH LAB;  Service: Cardiovascular;  Laterality: N/A;  . Tee without cardioversion N/A 03/12/2015    Procedure: TRANSESOPHAGEAL ECHOCARDIOGRAM (TEE);  Surgeon: Pixie Casino, MD;  Location: [facility] ENDOSCOPY;  Service: Cardiovascular;  Laterality: N/A;  . Cystoscopy with retrograde pyelogram, ureteroscopy and stent placement Left 04/24/2015    Procedure: URETHRAL MEATAL DILATION, CYSTOSCOPY WITH LEFT RETROGRADE PYELOGRAM, LEFT URETEROSCOPY, STONE BASKET EXTRACTION,  LEFT DOUBLE J STENT PLACEMENT;  Surgeon: Carolan Clines, MD;  Location: [facility] ORS;  Service: Urology;  Laterality: Left;  . Holmium laser application Left 0/17/5102    Procedure: HOLMIUM LASER APPLICATION;  Surgeon: Carolan Clines, MD;  Location: [facility] ORS;  Service: Urology;  Laterality: Left;     Current Outpatient Prescriptions  Medication Sig Dispense Refill  . acetaminophen (TYLENOL) 500 MG tablet Take 500-1,000 mg by mouth every 6 (  six) hours as needed for headache.    . albuterol (PROVENTIL HFA;VENTOLIN HFA) 108 (90 BASE) MCG/ACT inhaler Inhale 2 puffs into the lungs every 6 (six) hours as needed for wheezing or shortness of breath.    . allopurinol (ZYLOPRIM) 300 MG tablet Take 300 mg by mouth daily.      . AMMONIUM LACTATE EX Apply 1 application topically daily as needed (dry legs.).    Marland Kitchen apixaban (ELIQUIS) 5 MG TABS tablet Take 5 mg by mouth 2 (two) times daily.    Marland Kitchen atorvastatin (LIPITOR) 20 MG tablet TAKE 1 TABLET BY MOUTH EVERY DAY 90 tablet PRN  . buPROPion (WELLBUTRIN  XL) 300 MG 24 hr tablet Take 1 tablet (300 mg total) by mouth daily. 90 tablet 1  . busPIRone (BUSPAR) 10 MG tablet Take 10 mg by mouth 3 (three) times daily.    Marland Kitchen diltiazem (CARDIZEM CD) 180 MG 24 hr capsule Take 1 capsule (180 mg total) by mouth daily. 30 capsule 3  . diltiazem (CARDIZEM) 30 MG tablet Take one tablet every 4 - 6 hours as needed for breatkthrough AFib -- if heart rate is greater than 671 and systolic BP is greater than 110. (Patient taking differently: Take 30 mg by mouth every 4 (four) hours as needed (For breatkthrough AFib -- if heart rate is greater than 245 and systolic BP is greater than 110.). ) 20 tablet 1  . fish oil-omega-3 fatty acids 1000 MG capsule Take 1 g by mouth daily.     Marland Kitchen HYDROmorphone (DILAUDID) 8 MG tablet Take 1 tablet (8 mg total) by mouth every 4 (four) hours as needed for severe pain. Take 1/2 to 1 tab as needed 30 tablet 0  . lisinopril-hydrochlorothiazide (PRINZIDE,ZESTORETIC) 10-12.5 MG per tablet Take 0.5 tablets by mouth daily.    Marland Kitchen lisinopril-hydrochlorothiazide (PRINZIDE,ZESTORETIC) 10-12.5 MG per tablet TAKE 1 TABLET BY MOUTH EVERY DAY 90 tablet 0  . loratadine (CLARITIN) 10 MG tablet Take 10 mg by mouth daily.    Marland Kitchen LORazepam (ATIVAN) 0.5 MG tablet Take 1 tablet (0.5 mg total) by mouth 2 (two) times daily as needed for anxiety. 20 tablet 0  . Meth-Hyo-M Bl-Na Phos-Ph Sal (URIBEL) 118 MG CAPS Take 1 capsule (118 mg total) by mouth 4 (four) times daily as needed. (Patient taking differently: Take 1 capsule by mouth 4 (four) times daily as needed (bladder). ) 120 capsule 3  . mometasone (ASMANEX) 220 MCG/INH inhaler Inhale 2 puffs into the lungs 2 (two) times daily as needed (wheezing from bronchitis).     . Multiple Vitamins-Minerals (MULTIVITAMIN WITH MINERALS) tablet Take 1 tablet by mouth daily.      Marland Kitchen olopatadine (PATANOL) 0.1 % ophthalmic solution Place 1 drop into both eyes daily as needed (seasonal allergies).     Marland Kitchen omeprazole (PRILOSEC) 20 MG  capsule Take 20 mg by mouth daily.    . Potassium Bicarb-Citric Acid 20 MEQ TBEF Take 20 mEq by mouth daily. Dissolve in water and drink    . silodosin (RAPAFLO) 4 MG CAPS capsule Take 4 mg by mouth at bedtime.     Marland Kitchen zolpidem (AMBIEN) 10 MG tablet Take 1 tablet (10 mg total) by mouth once. (Patient taking differently: Take 0.5-1 mg by mouth at bedtime. ) 90 tablet 0   No current facility-administered medications for this visit.    Allergies:   Codeine and Oxycodone   Social History:  The patient  reports that he has never smoked. He has never used smokeless tobacco.  He reports that he drinks alcohol. He reports that he does not use illicit drugs.   Family History:  The patient's  family history includes Healthy in his brother; Heart attack (age of onset: 70) in his father; Heart attack (age of onset: 23) in his mother.    ROS:  Please see the history of present illness.   All other systems are reviewed and negative.    PHYSICAL EXAM: VS:  BP 126/80 mmHg  Pulse 95  Ht 6' (1.829 m)  Wt 102.876 kg (226 lb 12.8 oz)  BMI 30.75 kg/m2 , BMI Body mass index is 30.75 kg/(m^2). GEN: Well nourished, well developed, in no acute distress HEENT: normal Neck: no JVD, carotid bruits, or masses Cardiac: RRR; no murmurs, rubs, or gallops,no edema  Respiratory:  clear to auscultation bilaterally, normal work of breathing GI: soft, nontender, nondistended, + BS MS: no deformity or atrophy Skin: warm and dry  Neuro:  Strength and sensation are intact Psych: seems easily agitated during exam today  EKG:  EKG is ordered today. The ekg ordered today shows sinus rhythm with PACs,    Recent Labs: 01/26/2015: B Natriuretic Peptide 121.4*; Magnesium 1.8; TSH 1.309 04/04/2015: ALT 39 04/22/2015: BUN 17; Creatinine, Ser 0.65; Hemoglobin 13.8; Platelets 221; Potassium 3.8; Sodium 137    Lipid Panel     Component Value Date/Time   CHOL 153 09/23/2014 1007   TRIG 234* 09/23/2014 1007   HDL 43 09/23/2014  1007   CHOLHDL 3.6 09/23/2014 1007   VLDL 47* 09/23/2014 1007   LDLCALC 63 09/23/2014 1007     Wt Readings from Last 3 Encounters:  05/25/15 102.876 kg (226 lb 12.8 oz)  04/24/15 102.059 kg (225 lb)  04/22/15 102.059 kg (225 lb)     ASSESSMENT AND PLAN:  1.  Persistent atrial fibrillation Doing well s/p ablation I cannot tell for sure but it seems that he is maintaining sinus rhythm Will  Continue current medicines Consider implantable loop recorder if not improved upon return chads2vasc is 1-2.  Continue anticoagulation for now   2. HTN Stable No change required today Adequate hydration is encouraged given resting HR 90s  3. OSA Does not think he can wear CPAP.  He sees ENT.  I have asked that he discuss other treatment modalities for OSA with his ENT.  Current medicines are reviewed at length with the patient today.   The patient does not have concerns regarding his medicines.  The following changes were made today:  none   Signed, [name], MD  05/25/2015 8:46 AM     [facility] [address] [place] [address] [phone] (office) [phone] (fax)

## 2015-05-25 NOTE — Patient Instructions (Signed)
Medication Instructions: - no changes  Labwork: - none  Procedures/Testing: - none  Follow-Up: - Your physician recommends that you schedule a follow-up appointment in: 3 months with Dr. [name].  Any Additional Special Instructions Will Be Listed Below (If Applicable). - none

## 2015-05-26 ENCOUNTER — Telehealth: Payer: Self-pay | Admitting: Family Medicine

## 2015-05-26 MED ORDER — OLOPATADINE HCL 0.1 % OP SOLN: 1.0000 [drp] | Freq: Every day | OPHTHALMIC | Status: DC | PRN

## 2015-05-26 NOTE — Telephone Encounter (Signed)
Please call patient   He needs refill on patanol Rx and his dermatologist has retired

## 2015-05-26 NOTE — Telephone Encounter (Signed)
That is correct it is his eye drops he said Dr.Frank Houston would rx it for him

## 2015-05-26 NOTE — Telephone Encounter (Signed)
Patanol his eyedrops. The note says something about a dermatologist. Find out what he is really after

## 2015-05-31 ENCOUNTER — Other Ambulatory Visit: Payer: Self-pay | Admitting: Family Medicine

## 2015-06-01 NOTE — Telephone Encounter (Signed)
Is this okay?

## 2015-06-08 ENCOUNTER — Encounter: Payer: BLUE CROSS/BLUE SHIELD | Admitting: Internal Medicine

## 2015-06-10 ENCOUNTER — Encounter: Payer: Self-pay | Admitting: Family Medicine

## 2015-06-10 ENCOUNTER — Ambulatory Visit (INDEPENDENT_AMBULATORY_CARE_PROVIDER_SITE_OTHER): Payer: BLUE CROSS/BLUE SHIELD | Admitting: Family Medicine

## 2015-06-10 VITALS — BP 120/80 | HR 96 | Temp 98.3°F | Wt 231.0 lb

## 2015-06-10 DIAGNOSIS — B029 Zoster without complications: Secondary | ICD-10-CM

## 2015-06-10 DIAGNOSIS — N200 Calculus of kidney: Secondary | ICD-10-CM

## 2015-06-10 DIAGNOSIS — Z87442 Personal history of urinary calculi: Secondary | ICD-10-CM | POA: Insufficient documentation

## 2015-06-10 MED ORDER — VALACYCLOVIR HCL 1 G PO TABS: 1000.0000 mg | ORAL_TABLET | Freq: Three times a day (TID) | ORAL | Status: DC

## 2015-06-10 MED ORDER — TRAMADOL HCL 50 MG PO TABS: 50.0000 mg | ORAL_TABLET | Freq: Three times a day (TID) | ORAL | Status: DC | PRN

## 2015-06-10 NOTE — Progress Notes (Signed)
   Subjective:    Patient ID: Jeremy Tucker, male    DOB: 03-07-1951, 64 y.o.   MRN: [id number]  HPI He is here for evaluation of left ear pain he notes pain in the left ear several days ago that has been slowly getting worse. He then noted lesions present on the face. He states that the discomfort also was on the face. Also has had difficulty recently with multiple stones. He is being followed by Dr. Gaynelle Arabian for this.   Review of Systems     Objective:   Physical Exam Several erythematous raised lesion is noted on the left cheek and in the left ear. No true vesicles were noted. He does note hypersensitivity over the mandibular area. The TMs and canal are normal. Oral exam shows no lesions.       Assessment & Plan:  Shingles - Plan: valACYclovir (VALTREX) 1000 MG tablet  Renal stones  discussed the diagnosis of shingles with him. He will also be given tramadol to help with the pain. Caution him of being around young children and pregnant women.

## 2015-06-10 NOTE — Patient Instructions (Signed)

## 2015-06-13 ENCOUNTER — Other Ambulatory Visit (HOSPITAL_COMMUNITY): Payer: Self-pay | Admitting: Nurse Practitioner

## 2015-06-15 ENCOUNTER — Encounter: Payer: BLUE CROSS/BLUE SHIELD | Admitting: Internal Medicine

## 2015-06-16 ENCOUNTER — Telehealth: Payer: Self-pay | Admitting: Family Medicine

## 2015-06-16 ENCOUNTER — Other Ambulatory Visit: Payer: Self-pay | Admitting: Family Medicine

## 2015-06-16 NOTE — Telephone Encounter (Signed)
Is this okay?

## 2015-06-16 NOTE — Telephone Encounter (Signed)
Pt states that [facility] [place] still have not recvd anything from [place] stating that pt can have a shingles vaccine. Please call pt at [phone] to let him know when this is done

## 2015-08-11 ENCOUNTER — Telehealth: Payer: Self-pay

## 2015-08-11 ENCOUNTER — Other Ambulatory Visit: Payer: Self-pay

## 2015-08-11 MED ORDER — ZOLPIDEM TARTRATE 10 MG PO TABS: 10.0000 mg | ORAL_TABLET | Freq: Every evening | ORAL | Status: DC | PRN

## 2015-08-11 NOTE — Telephone Encounter (Signed)
Refill request for Ambien 10mg  #90

## 2015-08-11 NOTE — Telephone Encounter (Signed)
Okay to renew but no refills

## 2015-08-11 NOTE — Telephone Encounter (Signed)
[name], MD at 05/25/2015 8:46 AM  apixaban (ELIQUIS) 5 MG TABS tablet Take 5 mg by mouth 2 (two) times da         Current medicines are reviewed at length with the patient today.  The patient does not have concerns regarding his medicines. The following changes were made today: none Medication Instructions: - no changes  Per Arcola Jansky RN verbally ok Samples be placed up front for patient.

## 2015-08-16 ENCOUNTER — Other Ambulatory Visit: Payer: Self-pay | Admitting: Family Medicine

## 2015-08-18 ENCOUNTER — Telehealth: Payer: Self-pay | Admitting: Internal Medicine

## 2015-08-18 NOTE — Telephone Encounter (Signed)
New Message  Pt called C/o Fatigue in the afternoons. Doesn't want to continue to take lorazepam. States that he has anxiety. Pt also reports that he has spent a couple of days in the bed all afternoon. Request back to discuss.

## 2015-08-19 NOTE — Telephone Encounter (Signed)
Returned pt call. Pt sts that he is post afib ablation, but is having episodes  of breakthrough afib. That has saused him to stay in the bed 3 afternoons last week. Adv pt to disuss d/c lorazepam with his pcp. Pt has an appt with Dr.Allred on 10/17, but states that he called to have his appt moved up, Dr.Allred does not have earlier availability. Pt has been seen in the Afib clinic, adv pt that I will ask Melissa(EP scheduler) to see if Ceasar Lund would have an earlier appt available. Pt rqst a Thursday 10/5 appt since he will be in the are. Isla Pence' schedule and first available appt would be 10/11. Message routed to Dr.Allred's nurse to call pt an adv

## 2015-08-19 NOTE — Telephone Encounter (Signed)
Melissa is calling to schedule patient with clinic next week

## 2015-08-19 NOTE — Telephone Encounter (Signed)
Returned pt call. lmtcb if assistance is still needed 

## 2015-08-20 ENCOUNTER — Encounter (HOSPITAL_COMMUNITY): Payer: Self-pay | Admitting: Nurse Practitioner

## 2015-08-20 ENCOUNTER — Telehealth: Payer: Self-pay | Admitting: Family Medicine

## 2015-08-20 ENCOUNTER — Ambulatory Visit (HOSPITAL_COMMUNITY)
Admission: RE | Admit: 2015-08-20 | Discharge: 2015-08-20 | Disposition: A | Discharge status: Home / Self Care | Payer: PPO | Source: Ambulatory Visit | Attending: Nurse Practitioner | Admitting: Nurse Practitioner

## 2015-08-20 VITALS — BP 132/84 | HR 103 | Ht 72.0 in | Wt 236.0 lb

## 2015-08-20 DIAGNOSIS — I4891 Unspecified atrial fibrillation: Secondary | ICD-10-CM | POA: Insufficient documentation

## 2015-08-20 DIAGNOSIS — I481 Persistent atrial fibrillation: Secondary | ICD-10-CM | POA: Diagnosis not present

## 2015-08-20 DIAGNOSIS — I4819 Other persistent atrial fibrillation: Secondary | ICD-10-CM

## 2015-08-20 MED ORDER — METOPROLOL SUCCINATE ER 25 MG PO TB24: 25.0000 mg | ORAL_TABLET | Freq: Every day | ORAL | Status: DC

## 2015-08-20 NOTE — Patient Instructions (Signed)
Your physician has recommended you make the following change in your medication:  1)Metoprolol (toprol) 25mg  once a day.  Can start out at 1/2 tablet once a day (goal heart rate 70-80s)

## 2015-08-20 NOTE — Telephone Encounter (Signed)
PA: Zolpidem

## 2015-08-20 NOTE — Progress Notes (Signed)
Patient ID: Jeremy Tucker, male   DOB: 24-Aug-1951, 64 y.o.   MRN: [id number]      Primary Care Physician: [name], MD Referring Physician: Dr. Quentin Mulling is a 64 y.o. male with a h/o persistent afib  s/p ablation 03/12/15 that is here today for evaluation of 2-3 week h/o fatigue mostly in the afternoon and sensation of palpitations. Fatigue may or may not be associated with irregular heart beat. He feels so tired that he has to go to bed for a few hours. When he saw Dr. [name] last in July, he was having some palpitations and a ILR was discussed. He does see Dr. [name] in 2 weeks but wanted to see if anything could be done in the interim to help him feel better. 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 He wants to walk daily but the afternoon fatigue is getting to him. He has been diagnosed with moderate AS and will be due to have echo repeated later this fall.  Today, he denies symptoms of palpitations, chest pain, shortness of breath, orthopnea, PND, lower extremity edema, dizziness, presyncope, syncope, or neurologic sequela. The patient is tolerating medications without difficulties and is otherwise without complaint today.   Past Medical History  Diagnosis Date  . Hyperlipidemia   . GERD (gastroesophageal reflux disease)   . BPH (benign prostatic hyperplasia)   . Gout     denies  . Obesity   . Persistent atrial fibrillation ([place])     a. CHADsVASc score at least 2  . Aortic stenosis, mild   . Kidney stones   . DVT (deep venous thrombosis) ([place])   . Depression   . Hypertension     denies  . Glucose intolerance (impaired glucose tolerance)   . DJD (degenerative joint disease)   . Dysrhythmia     Atrial fibrillation   Past Surgical History  Procedure Laterality Date  . Colonoscopy  2010    MEDOFF  . Total knee arthroplasty Left 2001    MURPHY  . Tonsillectomy    . Joint replacement    . Knee arthroscopy Bilateral  "multiple times"  . Cystoscopy w/ stone manipulation  1980's    "couldn't get stone so they had to cut me open"  . Kidney stone surgery  1980's    "stone lodged in the duct; had to cut me open to get it"  . Fracture surgery    . Forearm fracture surgery Right ~ 1961  . Cardiac catheterization  1990's    "Dr. Melvern Banker", no blockages per pt  . Cardioversion N/A 02/25/2015    Procedure: CARDIOVERSION;  Surgeon: Sueanne Margarita, MD;  Location: [facility] ENDOSCOPY;  Service: Cardiovascular;  Laterality: N/A;  . Atrial fibrillation ablation N/A 03/12/2015    Procedure: ATRIAL FIBRILLATION ABLATION;  Surgeon: [name], MD;  Location: [facility] CATH LAB;  Service: Cardiovascular;  Laterality: N/A;  . Tee without cardioversion N/A 03/12/2015    Procedure: TRANSESOPHAGEAL ECHOCARDIOGRAM (TEE);  Surgeon: Pixie Casino, MD;  Location: [facility] ENDOSCOPY;  Service: Cardiovascular;  Laterality: N/A;  . Cystoscopy with retrograde pyelogram, ureteroscopy and stent placement Left 04/24/2015    Procedure: URETHRAL MEATAL DILATION, CYSTOSCOPY WITH LEFT RETROGRADE PYELOGRAM, LEFT URETEROSCOPY, STONE BASKET EXTRACTION,  LEFT DOUBLE J STENT PLACEMENT;  Surgeon: Carolan Clines, MD;  Location: [facility] ORS;  Service: Urology;  Laterality: Left;  . Holmium laser application Left 7/41/6384    Procedure: HOLMIUM LASER APPLICATION;  Surgeon:  Carolan Clines, MD;  Location: [facility] ORS;  Service: Urology;  Laterality: Left;    Current Outpatient Prescriptions  Medication Sig Dispense Refill  . acetaminophen (TYLENOL) 500 MG tablet Take 500-1,000 mg by mouth every 6 (six) hours as needed for headache.    . albuterol (PROVENTIL HFA;VENTOLIN HFA) 108 (90 BASE) MCG/ACT inhaler Inhale 2 puffs into the lungs every 6 (six) hours as needed for wheezing or shortness of breath.    . allopurinol (ZYLOPRIM) 300 MG tablet Take 300 mg by mouth daily.      . AMMONIUM LACTATE EX Apply 1 application topically daily as needed (dry legs.).    Marland Kitchen apixaban  (ELIQUIS) 5 MG TABS tablet Take 5 mg by mouth 2 (two) times daily.    Marland Kitchen atorvastatin (LIPITOR) 20 MG tablet TAKE 1 TABLET BY MOUTH EVERY DAY 90 tablet PRN  . buPROPion (WELLBUTRIN XL) 300 MG 24 hr tablet TAKE 1 TABLET BY MOUTH DAILY 90 tablet 0  . busPIRone (BUSPAR) 10 MG tablet Take 10 mg by mouth 2 (two) times daily.     Marland Kitchen diltiazem (CARDIZEM CD) 180 MG 24 hr capsule TAKE 1 CAPSULE(180 MG) BY MOUTH DAILY 30 capsule 6  . diltiazem (CARDIZEM) 30 MG tablet Take one tablet every 4 - 6 hours as needed for breatkthrough AFib -- if heart rate is greater than 809 and systolic BP is greater than 110. (Patient taking differently: Take 30 mg by mouth every 4 (four) hours as needed (For breatkthrough AFib -- if heart rate is greater than 983 and systolic BP is greater than 110.). ) 20 tablet 1  . fish oil-omega-3 fatty acids 1000 MG capsule Take 1 g by mouth daily.     Marland Kitchen lisinopril-hydrochlorothiazide (PRINZIDE,ZESTORETIC) 10-12.5 MG per tablet Take 0.5 tablets by mouth daily.    Marland Kitchen loratadine (CLARITIN) 10 MG tablet Take 10 mg by mouth daily.    Marland Kitchen LORazepam (ATIVAN) 0.5 MG tablet Take 1 tablet (0.5 mg total) by mouth 2 (two) times daily as needed for anxiety. 20 tablet 0  . mometasone (ASMANEX) 220 MCG/INH inhaler Inhale 2 puffs into the lungs 2 (two) times daily as needed (wheezing from bronchitis).     . Multiple Vitamins-Minerals (MULTIVITAMIN WITH MINERALS) tablet Take 1 tablet by mouth daily.      Marland Kitchen olopatadine (PATANOL) 0.1 % ophthalmic solution Place 1 drop into both eyes daily as needed (seasonal allergies). 5 mL 11  . omeprazole (PRILOSEC) 20 MG capsule Take 20 mg by mouth daily.    . Potassium Bicarb-Citric Acid 20 MEQ TBEF Take 20 mEq by mouth daily. Dissolve in water and drink    . silodosin (RAPAFLO) 4 MG CAPS capsule Take 4 mg by mouth at bedtime.     Marland Kitchen zolpidem (AMBIEN) 10 MG tablet Take 1 tablet (10 mg total) by mouth at bedtime as needed for sleep. 90 tablet 0  . metoprolol succinate (TOPROL  XL) 25 MG 24 hr tablet Take 1 tablet (25 mg total) by mouth daily. 30 tablet 2  . valACYclovir (VALTREX) 1000 MG tablet Take 1 tablet (1,000 mg total) by mouth 3 (three) times daily. (Patient not taking: Reported on 08/20/2015) 21 tablet 0   No current facility-administered medications for this encounter.    Allergies  Allergen Reactions  . Codeine Itching  . Oxycodone Itching    Social History   Social History  . Marital Status: Married    Spouse Name: N/A  . Number of Children: N/A  . Years of  Education: N/A   Occupational History  . Not on file.   Social History Main Topics  . Smoking status: Never Smoker   . Smokeless tobacco: Never Used  . Alcohol Use: Yes     Comment: 01/21/2015 "maybe 4-5 drinks/yr"  . Drug Use: No  . Sexual Activity: Yes   Other Topics Concern  . Not on file   Social History Narrative   Pt lives in [place] with spouse.  62 yo son is health.  Second son died in car accident.      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 Heart attack Mother 52    MI  . Heart attack Father 38    ? MI versus trauma to head  . Healthy Brother     ROS- All systems are reviewed and negative except as per the HPI above  Physical Exam: Filed Vitals:   08/20/15 0926  BP: 132/84  Pulse: 103  Height: 6' (1.829 m)  Weight: 236 lb (107.049 kg)    GEN- The patient is well appearing, alert and oriented x 3 today.   Head- normocephalic, atraumatic Eyes-  Sclera clear, conjunctiva pink Ears- hearing intact Oropharynx- clear Neck- supple, no JVP Lymph- no cervical lymphadenopathy Lungs- Clear to ausculation bilaterally, normal work of breathing Heart-Irregular rate and rhythm, no murmurs, rubs or gallops, PMI not laterally displaced GI- soft, NT, ND, + BS Extremities- no clubbing, cyanosis, or edema MS- no significant deformity or atrophy Skin- no rash or lesion Psych- euthymic mood, full affect Neuro-  strength and sensation are intact  EKG- ? Atrial tach/ vrs afib/ vrs sinus tach,Pr int 148 ms, QRS 88 ms, QTc 445 ms Epic records reviewed  Assessment and Plan:  1. Afib s/p ablation Continues with irregular heart beat, question etiology I think ILR would be helpful  He thinks he will pursue  this when he sees Dr. [name] Will add metoprolol 25 mg 1/2 tab to 1 tab daily for symptoms of palpations/fatigue  F/u with Dr. [name]  10/17 afib clinic as needed

## 2015-08-21 ENCOUNTER — Telehealth: Payer: Self-pay

## 2015-08-21 NOTE — Telephone Encounter (Signed)
Medical records sent out to Disability Determination Services on 08/21/15

## 2015-08-24 ENCOUNTER — Other Ambulatory Visit (HOSPITAL_COMMUNITY): Payer: Self-pay | Admitting: Nurse Practitioner

## 2015-08-24 ENCOUNTER — Other Ambulatory Visit: Payer: Self-pay | Admitting: Family Medicine

## 2015-08-24 NOTE — Telephone Encounter (Signed)
Is this ok to refill?  

## 2015-08-25 ENCOUNTER — Other Ambulatory Visit: Payer: Self-pay

## 2015-08-25 NOTE — Telephone Encounter (Signed)
P.A. Approved til 11/14/2015, pt informed, faxed pharmacy.  Pt also request Eliquis samples, called rep & she is out.

## 2015-08-25 NOTE — Telephone Encounter (Signed)
Call out 1 mo supply of Lorazepam

## 2015-08-30 ENCOUNTER — Other Ambulatory Visit: Payer: Self-pay | Admitting: Family Medicine

## 2015-08-31 ENCOUNTER — Ambulatory Visit (INDEPENDENT_AMBULATORY_CARE_PROVIDER_SITE_OTHER): Payer: PPO | Admitting: Internal Medicine

## 2015-08-31 ENCOUNTER — Other Ambulatory Visit: Payer: Self-pay | Admitting: Family Medicine

## 2015-08-31 ENCOUNTER — Encounter: Payer: Self-pay | Admitting: Internal Medicine

## 2015-08-31 VITALS — BP 120/76 | HR 88 | Ht 72.0 in | Wt 237.1 lb

## 2015-08-31 DIAGNOSIS — I10 Essential (primary) hypertension: Secondary | ICD-10-CM | POA: Diagnosis not present

## 2015-08-31 DIAGNOSIS — I480 Paroxysmal atrial fibrillation: Secondary | ICD-10-CM

## 2015-08-31 DIAGNOSIS — G4733 Obstructive sleep apnea (adult) (pediatric): Secondary | ICD-10-CM | POA: Diagnosis not present

## 2015-08-31 DIAGNOSIS — I350 Nonrheumatic aortic (valve) stenosis: Secondary | ICD-10-CM | POA: Diagnosis not present

## 2015-08-31 MED ORDER — DILTIAZEM HCL ER COATED BEADS 180 MG PO CP24: 180.0000 mg | ORAL_CAPSULE | Freq: Two times a day (BID) | ORAL | Status: DC

## 2015-08-31 NOTE — Telephone Encounter (Signed)
Is this okay?

## 2015-08-31 NOTE — Progress Notes (Signed)
Electrophysiology Office Note   Date:  08/31/2015   ID:  Jeremy Tucker, DOB 1951-04-20, MRN [id number]  PCP:  [name], MD  Cardiologist:  Dr Tamala Julian Primary Electrophysiologist: [name], MD    Chief Complaint  Patient presents with  . PAF     History of Present Illness: Jeremy Tucker is a 64 y.o. male who presents today for electrophysiology evaluation.   He has noticed his heart rates 90s-100 but is not sure that he has had afib.  EKgs have confirmed predominantly sinus with PACs.  I have not seen AF documented post ablation. He feels tired at times.  He has moderate sleep apnea by recent sleep study but does not tolerate CPAP.  Today, he denies symptoms of chest pain,  orthopnea, PND, lower extremity edema, claudication, dizziness, presyncope, syncope, bleeding, or neurologic sequela. The patient is tolerating medications without difficulties and is otherwise without complaint today.    Past Medical History  Diagnosis Date  . Hyperlipidemia   . GERD (gastroesophageal reflux disease)   . BPH (benign prostatic hyperplasia)   . Gout     denies  . Obesity   . Persistent atrial fibrillation ([place])     a. CHADsVASc score at least 2  . Aortic stenosis, mild   . Kidney stones   . DVT (deep venous thrombosis) ([place])   . Depression   . Hypertension     denies  . Glucose intolerance (impaired glucose tolerance)   . DJD (degenerative joint disease)   . Dysrhythmia     Atrial fibrillation   Past Surgical History  Procedure Laterality Date  . Colonoscopy  2010    MEDOFF  . Total knee arthroplasty Left 2001    MURPHY  . Tonsillectomy    . Joint replacement    . Knee arthroscopy Bilateral "multiple times"  . Cystoscopy w/ stone manipulation  1980's    "couldn't get stone so they had to cut me open"  . Kidney stone surgery  1980's    "stone lodged in the duct; had to cut me open to get it"  . Fracture surgery    . Forearm fracture surgery Right ~ 1961  .  Cardiac catheterization  1990's    "Dr. Melvern Banker", no blockages per pt  . Cardioversion N/A 02/25/2015    Procedure: CARDIOVERSION;  Surgeon: Sueanne Margarita, MD;  Location: [facility] ENDOSCOPY;  Service: Cardiovascular;  Laterality: N/A;  . Atrial fibrillation ablation N/A 03/12/2015    Procedure: ATRIAL FIBRILLATION ABLATION;  Surgeon: [name], MD;  Location: [facility] CATH LAB;  Service: Cardiovascular;  Laterality: N/A;  . Tee without cardioversion N/A 03/12/2015    Procedure: TRANSESOPHAGEAL ECHOCARDIOGRAM (TEE);  Surgeon: Pixie Casino, MD;  Location: [facility] ENDOSCOPY;  Service: Cardiovascular;  Laterality: N/A;  . Cystoscopy with retrograde pyelogram, ureteroscopy and stent placement Left 04/24/2015    Procedure: URETHRAL MEATAL DILATION, CYSTOSCOPY WITH LEFT RETROGRADE PYELOGRAM, LEFT URETEROSCOPY, STONE BASKET EXTRACTION,  LEFT DOUBLE J STENT PLACEMENT;  Surgeon: Carolan Clines, MD;  Location: [facility] ORS;  Service: Urology;  Laterality: Left;  . Holmium laser application Left 1/66/0630    Procedure: HOLMIUM LASER APPLICATION;  Surgeon: Carolan Clines, MD;  Location: [facility] ORS;  Service: Urology;  Laterality: Left;     Current Outpatient Prescriptions  Medication Sig Dispense Refill  . acetaminophen (TYLENOL) 500 MG tablet Take 500-1,000 mg by mouth every 6 (six) hours as needed for headache.    . albuterol (PROVENTIL HFA;VENTOLIN HFA) 108 (90 BASE) MCG/ACT  inhaler Inhale 2 puffs into the lungs every 6 (six) hours as needed for wheezing or shortness of breath.    . allopurinol (ZYLOPRIM) 300 MG tablet Take 300 mg by mouth daily.      Marland Kitchen ammonium lactate (LAC-HYDRIN) 12 % lotion APPLY ON THE SKIN AS DIRECTED  11  . AMMONIUM LACTATE EX Apply 1 application topically daily as needed (dry legs.).    Marland Kitchen apixaban (ELIQUIS) 5 MG TABS tablet Take 5 mg by mouth 2 (two) times daily.    Marland Kitchen atorvastatin (LIPITOR) 20 MG tablet TAKE 1 TABLET BY MOUTH EVERY DAY 90 tablet PRN  . busPIRone (BUSPAR) 10 MG tablet Take 10 mg  by mouth 2 (two) times daily.     Marland Kitchen diltiazem (CARDIZEM CD) 180 MG 24 hr capsule Take 1 capsule (180 mg total) by mouth 2 (two) times daily. 180 capsule 3  . diltiazem (CARDIZEM) 30 MG tablet SEE NOTES 45 tablet 1  . fish oil-omega-3 fatty acids 1000 MG capsule Take 1 g by mouth daily.     Marland Kitchen ketorolac (ACULAR) 0.4 % SOLN INSTILL 1 GTT INTO SURGICAL EYE QID STARTING WEDNESDAY BEFORE SURGERY  1  . lisinopril-hydrochlorothiazide (PRINZIDE,ZESTORETIC) 10-12.5 MG per tablet Take 0.5 tablets by mouth daily.    Marland Kitchen loratadine (CLARITIN) 10 MG tablet Take 10 mg by mouth daily.    Marland Kitchen LORazepam (ATIVAN) 0.5 MG tablet TAKE 1 TABLET BY MOUTH TWICE DAILY AS NEEDED FOR ANXIETY 20 tablet 0  . metoprolol succinate (TOPROL XL) 25 MG 24 hr tablet Take 1 tablet (25 mg total) by mouth daily. 30 tablet 2  . mometasone (ASMANEX) 220 MCG/INH inhaler Inhale 2 puffs into the lungs 2 (two) times daily as needed (wheezing from bronchitis).     . Multiple Vitamins-Minerals (MULTIVITAMIN WITH MINERALS) tablet Take 1 tablet by mouth daily.      Marland Kitchen ofloxacin (OCUFLOX) 0.3 % ophthalmic solution   1  . olopatadine (PATANOL) 0.1 % ophthalmic solution Place 1 drop into both eyes daily as needed (seasonal allergies). 5 mL 11  . omeprazole (PRILOSEC) 20 MG capsule Take 20 mg by mouth daily.    . Potassium Bicarb-Citric Acid 20 MEQ TBEF Take 20 mEq by mouth daily. Dissolve in water and drink    . Potassium Citrate 15 MEQ (1620 MG) TBCR TK 1 T PO BID  11  . prednisoLONE acetate (PRED FORTE) 1 % ophthalmic suspension   1  . silodosin (RAPAFLO) 4 MG CAPS capsule Take 4 mg by mouth at bedtime.     . valACYclovir (VALTREX) 1000 MG tablet Take 1 tablet (1,000 mg total) by mouth 3 (three) times daily. 21 tablet 0  . zolpidem (AMBIEN) 10 MG tablet Take 1 tablet (10 mg total) by mouth at bedtime as needed for sleep. 90 tablet 0  . buPROPion (WELLBUTRIN XL) 300 MG 24 hr tablet TAKE 1 TABLET BY MOUTH DAILY 90 tablet 0   No current  facility-administered medications for this visit.    Allergies:   Codeine and Oxycodone   Social History:  The patient  reports that he has never smoked. He has never used smokeless tobacco. He reports that he drinks alcohol. He reports that he does not use illicit drugs.   Family History:  The patient's  family history includes Healthy in his brother; Heart attack (age of onset: 69) in his father; Heart attack (age of onset: 31) in his mother.    ROS:  Please see the history of present illness.  All other systems are reviewed and negative.    PHYSICAL EXAM: VS:  BP 120/76 mmHg  Pulse 88  Ht 6' (1.829 m)  Wt 237 lb 1.9 oz (107.557 kg)  BMI 32.15 kg/m2 , BMI Body mass index is 32.15 kg/(m^2). GEN: Well nourished, well developed, in no acute distress HEENT: normal Neck: no JVD, carotid bruits, or masses Cardiac: RRR; 2/6 SEM LUSB (mid peaking) Respiratory:  clear to auscultation bilaterally, normal work of breathing GI: soft, nontender, nondistended, + BS MS: no deformity or atrophy Skin: warm and dry  Neuro:  Strength and sensation are intact Psych: seems easily agitated during exam today  EKG:  EKG is ordered today. The ekg ordered today shows sinus rhythm with frequent PACs,    Recent Labs: 01/26/2015: B Natriuretic Peptide 121.4*; Magnesium 1.8; TSH 1.309 04/04/2015: ALT 39 04/22/2015: BUN 17; Creatinine, Ser 0.65; Hemoglobin 13.8; Platelets 221; Potassium 3.8; Sodium 137    Lipid Panel     Component Value Date/Time   CHOL 153 09/23/2014 1007   TRIG 234* 09/23/2014 1007   HDL 43 09/23/2014 1007   CHOLHDL 3.6 09/23/2014 1007   VLDL 47* 09/23/2014 1007   LDLCALC 63 09/23/2014 1007     Wt Readings from Last 3 Encounters:  08/31/15 237 lb 1.9 oz (107.557 kg)  08/20/15 236 lb (107.049 kg)  06/10/15 231 lb (104.781 kg)     ASSESSMENT AND PLAN:  1.  Persistent atrial fibrillation Doing well s/p ablation I cannot tell for sure but it seems that he is maintaining  sinus rhythm with PACs Therapeutic strategies including repeat ablation and additional AAD therapy were discussed today. Before we can better determine a future path, I would like to better understand/ characterize his arrhythmia burden. I would therefore advise implantable loop recorder placement at this time. Risks, benefits, and alternatives to ILR implant were discussed with the patient who wishes to proceed at the next available time.  2. HTN Stable No change required today  3. OSA Does not think he can wear CPAP.  He sees ENT.  I have asked that he discuss other treatment modalities for OSA with his ENT.  4. Moderate AS Continue conservative management  Current medicines are reviewed at length with the patient today.   The patient does not have concerns regarding his medicines.  The following changes were made today:  none   Signed, [name], MD  08/31/2015 10:12 PM     [address] [place] [address] [phone] (office) [phone] (fax)

## 2015-08-31 NOTE — Patient Instructions (Signed)
Medication Instructions:  Your physician has recommended you make the following change in your medication:  1) Increase Diltiazem to 180mg  twice daily   Labwork: None ordered   Testing/Procedures: LINQ implant on 09/15/15.  Please arrive at the [facility] at 6:30am  Follow-Up: Your physician recommends that you schedule a follow-up appointment in: 5 weeks with Roderic Palau, NP   Any Other Special Instructions Will Be Listed Below (If Applicable).

## 2015-09-08 ENCOUNTER — Telehealth: Payer: Self-pay | Admitting: Family Medicine

## 2015-09-09 ENCOUNTER — Telehealth: Payer: Self-pay

## 2015-09-09 NOTE — Telephone Encounter (Signed)
P.A. Zolpidem Is approved til 11/14/15 per [employer]

## 2015-09-09 NOTE — Telephone Encounter (Signed)
Pt called asking to speak with Dr. Redmond School specifically about some prescriptions. He wouldn't tell me what it was about, he just kept saying he wanted the doc to call him about it. He can be reached at [phone]

## 2015-09-15 ENCOUNTER — Ambulatory Visit (HOSPITAL_COMMUNITY)
Admission: RE | Admit: 2015-09-15 | Discharge: 2015-09-15 | Disposition: A | Discharge status: Home / Self Care | Payer: PPO | Source: Ambulatory Visit | Attending: Internal Medicine | Admitting: Internal Medicine

## 2015-09-15 ENCOUNTER — Encounter (HOSPITAL_COMMUNITY)
Admission: RE | Disposition: A | Discharge status: Home / Self Care | Payer: Self-pay | Source: Ambulatory Visit | Attending: Internal Medicine

## 2015-09-15 ENCOUNTER — Encounter (HOSPITAL_COMMUNITY): Payer: Self-pay | Admitting: Internal Medicine

## 2015-09-15 ENCOUNTER — Other Ambulatory Visit: Payer: Self-pay | Admitting: Family Medicine

## 2015-09-15 DIAGNOSIS — Z8249 Family history of ischemic heart disease and other diseases of the circulatory system: Secondary | ICD-10-CM | POA: Insufficient documentation

## 2015-09-15 DIAGNOSIS — Z7901 Long term (current) use of anticoagulants: Secondary | ICD-10-CM | POA: Insufficient documentation

## 2015-09-15 DIAGNOSIS — M1990 Unspecified osteoarthritis, unspecified site: Secondary | ICD-10-CM | POA: Insufficient documentation

## 2015-09-15 DIAGNOSIS — R7302 Impaired glucose tolerance (oral): Secondary | ICD-10-CM | POA: Insufficient documentation

## 2015-09-15 DIAGNOSIS — E669 Obesity, unspecified: Secondary | ICD-10-CM | POA: Diagnosis not present

## 2015-09-15 DIAGNOSIS — N400 Enlarged prostate without lower urinary tract symptoms: Secondary | ICD-10-CM | POA: Diagnosis not present

## 2015-09-15 DIAGNOSIS — F329 Major depressive disorder, single episode, unspecified: Secondary | ICD-10-CM | POA: Insufficient documentation

## 2015-09-15 DIAGNOSIS — I10 Essential (primary) hypertension: Secondary | ICD-10-CM | POA: Insufficient documentation

## 2015-09-15 DIAGNOSIS — E785 Hyperlipidemia, unspecified: Secondary | ICD-10-CM | POA: Insufficient documentation

## 2015-09-15 DIAGNOSIS — I481 Persistent atrial fibrillation: Secondary | ICD-10-CM | POA: Insufficient documentation

## 2015-09-15 DIAGNOSIS — Z6832 Body mass index (BMI) 32.0-32.9, adult: Secondary | ICD-10-CM | POA: Insufficient documentation

## 2015-09-15 DIAGNOSIS — K219 Gastro-esophageal reflux disease without esophagitis: Secondary | ICD-10-CM | POA: Diagnosis not present

## 2015-09-15 DIAGNOSIS — M109 Gout, unspecified: Secondary | ICD-10-CM | POA: Insufficient documentation

## 2015-09-15 DIAGNOSIS — G4733 Obstructive sleep apnea (adult) (pediatric): Secondary | ICD-10-CM | POA: Insufficient documentation

## 2015-09-15 DIAGNOSIS — Z87442 Personal history of urinary calculi: Secondary | ICD-10-CM | POA: Diagnosis not present

## 2015-09-15 DIAGNOSIS — I4891 Unspecified atrial fibrillation: Secondary | ICD-10-CM | POA: Diagnosis not present

## 2015-09-15 DIAGNOSIS — I480 Paroxysmal atrial fibrillation: Secondary | ICD-10-CM | POA: Diagnosis present

## 2015-09-15 HISTORY — PX: EP IMPLANTABLE DEVICE: SHX172B

## 2015-09-15 SURGERY — LOOP RECORDER INSERTION

## 2015-09-15 MED ORDER — LIDOCAINE-EPINEPHRINE 1 %-1:100000 IJ SOLN
INTRAMUSCULAR | Status: DC | PRN
  Administered 2015-09-15: 10 mL

## 2015-09-15 MED ORDER — LIDOCAINE-EPINEPHRINE 1 %-1:100000 IJ SOLN
INTRAMUSCULAR | Status: AC
  Filled 2015-09-15: qty 1

## 2015-09-15 SURGICAL SUPPLY — 2 items
LOOP REVEAL LINQSYS (Prosthesis & Implant Heart) ×2 IMPLANT
PACK LOOP INSERTION (CUSTOM PROCEDURE TRAY) ×2 IMPLANT

## 2015-09-15 NOTE — H&P (View-Only) (Signed)
Electrophysiology Office Note   Date:  08/31/2015   ID:  Jeremy Tucker, DOB 10-20-1951, MRN [id number]  PCP:  [name], MD  Cardiologist:  Dr Tamala Julian Primary Electrophysiologist: [name], MD    Chief Complaint  Patient presents with  . PAF     History of Present Illness: Jeremy Tucker is a 64 y.o. male who presents today for electrophysiology evaluation.   He has noticed his heart rates 90s-100 but is not sure that he has had afib.  EKgs have confirmed predominantly sinus with PACs.  I have not seen AF documented post ablation. He feels tired at times.  He has moderate sleep apnea by recent sleep study but does not tolerate CPAP.  Today, he denies symptoms of chest pain,  orthopnea, PND, lower extremity edema, claudication, dizziness, presyncope, syncope, bleeding, or neurologic sequela. The patient is tolerating medications without difficulties and is otherwise without complaint today.    Past Medical History  Diagnosis Date  . Hyperlipidemia   . GERD (gastroesophageal reflux disease)   . BPH (benign prostatic hyperplasia)   . Gout     denies  . Obesity   . Persistent atrial fibrillation (Jeremy Tucker)     a. CHADsVASc score at least 2  . Aortic stenosis, mild   . Kidney stones   . DVT (deep venous thrombosis) (Jeremy Tucker)   . Depression   . Hypertension     denies  . Glucose intolerance (impaired glucose tolerance)   . DJD (degenerative joint disease)   . Dysrhythmia     Atrial fibrillation   Past Surgical History  Procedure Laterality Date  . Colonoscopy  2010    MEDOFF  . Total knee arthroplasty Left 2001    MURPHY  . Tonsillectomy    . Joint replacement    . Knee arthroscopy Bilateral "multiple times"  . Cystoscopy w/ stone manipulation  1980's    "couldn't get stone so they had to cut me open"  . Kidney stone surgery  1980's    "stone lodged in the duct; had to cut me open to get it"  . Fracture surgery    . Forearm fracture surgery Right ~ 1961  .  Cardiac catheterization  1990's    "Dr. Melvern Banker", no blockages per pt  . Cardioversion N/A 02/25/2015    Procedure: CARDIOVERSION;  Surgeon: Sueanne Margarita, MD;  Location: [facility] ENDOSCOPY;  Service: Cardiovascular;  Laterality: N/A;  . Atrial fibrillation ablation N/A 03/12/2015    Procedure: ATRIAL FIBRILLATION ABLATION;  Surgeon: [name], MD;  Location: [facility] CATH LAB;  Service: Cardiovascular;  Laterality: N/A;  . Tee without cardioversion N/A 03/12/2015    Procedure: TRANSESOPHAGEAL ECHOCARDIOGRAM (TEE);  Surgeon: Pixie Casino, MD;  Location: [facility] ENDOSCOPY;  Service: Cardiovascular;  Laterality: N/A;  . Cystoscopy with retrograde pyelogram, ureteroscopy and stent placement Left 04/24/2015    Procedure: URETHRAL MEATAL DILATION, CYSTOSCOPY WITH LEFT RETROGRADE PYELOGRAM, LEFT URETEROSCOPY, STONE BASKET EXTRACTION,  LEFT DOUBLE J STENT PLACEMENT;  Surgeon: Carolan Clines, MD;  Location: [facility] ORS;  Service: Urology;  Laterality: Left;  . Holmium laser application Left 0/27/7412    Procedure: HOLMIUM LASER APPLICATION;  Surgeon: Carolan Clines, MD;  Location: [facility] ORS;  Service: Urology;  Laterality: Left;     Current Outpatient Prescriptions  Medication Sig Dispense Refill  . acetaminophen (TYLENOL) 500 MG tablet Take 500-1,000 mg by mouth every 6 (six) hours as needed for headache.    . albuterol (PROVENTIL HFA;VENTOLIN HFA) 108 (90 BASE) MCG/ACT  inhaler Inhale 2 puffs into the lungs every 6 (six) hours as needed for wheezing or shortness of breath.    . allopurinol (ZYLOPRIM) 300 MG tablet Take 300 mg by mouth daily.      Marland Kitchen ammonium lactate (LAC-HYDRIN) 12 % lotion APPLY ON THE SKIN AS DIRECTED  11  . AMMONIUM LACTATE EX Apply 1 application topically daily as needed (dry legs.).    Marland Kitchen apixaban (ELIQUIS) 5 MG TABS tablet Take 5 mg by mouth 2 (two) times daily.    Marland Kitchen atorvastatin (LIPITOR) 20 MG tablet TAKE 1 TABLET BY MOUTH EVERY DAY 90 tablet PRN  . busPIRone (BUSPAR) 10 MG tablet Take 10 mg  by mouth 2 (two) times daily.     Marland Kitchen diltiazem (CARDIZEM CD) 180 MG 24 hr capsule Take 1 capsule (180 mg total) by mouth 2 (two) times daily. 180 capsule 3  . diltiazem (CARDIZEM) 30 MG tablet SEE NOTES 45 tablet 1  . fish oil-omega-3 fatty acids 1000 MG capsule Take 1 g by mouth daily.     Marland Kitchen ketorolac (ACULAR) 0.4 % SOLN INSTILL 1 GTT INTO SURGICAL EYE QID STARTING WEDNESDAY BEFORE SURGERY  1  . lisinopril-hydrochlorothiazide (PRINZIDE,ZESTORETIC) 10-12.5 MG per tablet Take 0.5 tablets by mouth daily.    Marland Kitchen loratadine (CLARITIN) 10 MG tablet Take 10 mg by mouth daily.    Marland Kitchen LORazepam (ATIVAN) 0.5 MG tablet TAKE 1 TABLET BY MOUTH TWICE DAILY AS NEEDED FOR ANXIETY 20 tablet 0  . metoprolol succinate (TOPROL XL) 25 MG 24 hr tablet Take 1 tablet (25 mg total) by mouth daily. 30 tablet 2  . mometasone (ASMANEX) 220 MCG/INH inhaler Inhale 2 puffs into the lungs 2 (two) times daily as needed (wheezing from bronchitis).     . Multiple Vitamins-Minerals (MULTIVITAMIN WITH MINERALS) tablet Take 1 tablet by mouth daily.      Marland Kitchen ofloxacin (OCUFLOX) 0.3 % ophthalmic solution   1  . olopatadine (PATANOL) 0.1 % ophthalmic solution Place 1 drop into both eyes daily as needed (seasonal allergies). 5 mL 11  . omeprazole (PRILOSEC) 20 MG capsule Take 20 mg by mouth daily.    . Potassium Bicarb-Citric Acid 20 MEQ TBEF Take 20 mEq by mouth daily. Dissolve in water and drink    . Potassium Citrate 15 MEQ (1620 MG) TBCR TK 1 T PO BID  11  . prednisoLONE acetate (PRED FORTE) 1 % ophthalmic suspension   1  . silodosin (RAPAFLO) 4 MG CAPS capsule Take 4 mg by mouth at bedtime.     . valACYclovir (VALTREX) 1000 MG tablet Take 1 tablet (1,000 mg total) by mouth 3 (three) times daily. 21 tablet 0  . zolpidem (AMBIEN) 10 MG tablet Take 1 tablet (10 mg total) by mouth at bedtime as needed for sleep. 90 tablet 0  . buPROPion (WELLBUTRIN XL) 300 MG 24 hr tablet TAKE 1 TABLET BY MOUTH DAILY 90 tablet 0   No current  facility-administered medications for this visit.    Allergies:   Codeine and Oxycodone   Social History:  The patient  reports that he has never smoked. He has never used smokeless tobacco. He reports that he drinks alcohol. He reports that he does not use illicit drugs.   Family History:  The patient's  family history includes Healthy in his brother; Heart attack (age of onset: 69) in his father; Heart attack (age of onset: 12) in his mother.    ROS:  Please see the history of present illness.  All other systems are reviewed and negative.    PHYSICAL EXAM: VS:  BP 120/76 mmHg  Pulse 88  Ht 6' (1.829 m)  Wt 237 lb 1.9 oz (107.557 kg)  BMI 32.15 kg/m2 , BMI Body mass index is 32.15 kg/(m^2). GEN: Well nourished, well developed, in no acute distress HEENT: normal Neck: no JVD, carotid bruits, or masses Cardiac: RRR; 2/6 SEM LUSB (mid peaking) Respiratory:  clear to auscultation bilaterally, normal work of breathing GI: soft, nontender, nondistended, + BS MS: no deformity or atrophy Skin: warm and dry  Neuro:  Strength and sensation are intact Psych: seems easily agitated during exam today  EKG:  EKG is ordered today. The ekg ordered today shows sinus rhythm with frequent PACs,    Recent Labs: 01/26/2015: B Natriuretic Peptide 121.4*; Magnesium 1.8; TSH 1.309 04/04/2015: ALT 39 04/22/2015: BUN 17; Creatinine, Ser 0.65; Hemoglobin 13.8; Platelets 221; Potassium 3.8; Sodium 137    Lipid Panel     Component Value Date/Time   CHOL 153 09/23/2014 1007   TRIG 234* 09/23/2014 1007   HDL 43 09/23/2014 1007   CHOLHDL 3.6 09/23/2014 1007   VLDL 47* 09/23/2014 1007   LDLCALC 63 09/23/2014 1007     Wt Readings from Last 3 Encounters:  08/31/15 237 lb 1.9 oz (107.557 kg)  08/20/15 236 lb (107.049 kg)  06/10/15 231 lb (104.781 kg)     ASSESSMENT AND PLAN:  1.  Persistent atrial fibrillation Doing well s/p ablation I cannot tell for sure but it seems that he is maintaining  sinus rhythm with PACs Therapeutic strategies including repeat ablation and additional AAD therapy were discussed today. Before we can better determine a future path, I would like to better understand/ characterize his arrhythmia burden. I would therefore advise implantable loop recorder placement at this time. Risks, benefits, and alternatives to ILR implant were discussed with the patient who wishes to proceed at the next available time.  2. HTN Stable No change required today  3. OSA Does not think he can wear CPAP.  He sees ENT.  I have asked that he discuss other treatment modalities for OSA with his ENT.  4. Moderate AS Continue conservative management  Current medicines are reviewed at length with the patient today.   The patient does not have concerns regarding his medicines.  The following changes were made today:  none   Signed, [name], MD  08/31/2015 10:12 PM     [address] [place] [address] [phone] (office) [phone] (fax)

## 2015-09-15 NOTE — Interval H&P Note (Signed)
History and Physical Interval Note:  09/15/2015 7:17 AM  Jeremy Tucker  has presented today for surgery, with the diagnosis of afib  The various methods of treatment have been discussed with the patient and family. After consideration of risks, benefits and other options for treatment, the patient has consented to  Procedure(s): Loop Recorder Insertion (N/A) as a surgical intervention .  The patient's history has been reviewed, patient examined, no change in status, stable for surgery.  I have reviewed the patient's chart and labs.  Questions were answered to the patient's satisfaction.     [name]

## 2015-09-16 ENCOUNTER — Telehealth: Payer: Self-pay | Admitting: Family Medicine

## 2015-09-16 NOTE — Telephone Encounter (Signed)
Is this okay?

## 2015-09-16 NOTE — Telephone Encounter (Signed)
Pt requesting refill on Buspar 10MG  #120 takes twice a day

## 2015-09-24 ENCOUNTER — Encounter: Payer: Self-pay | Admitting: Cardiology

## 2015-09-24 NOTE — Telephone Encounter (Signed)
Have you talked to him?

## 2015-09-24 NOTE — Telephone Encounter (Signed)
Done

## 2015-09-28 ENCOUNTER — Encounter: Payer: Self-pay | Admitting: Internal Medicine

## 2015-09-28 ENCOUNTER — Ambulatory Visit (INDEPENDENT_AMBULATORY_CARE_PROVIDER_SITE_OTHER): Payer: PPO | Admitting: *Deleted

## 2015-09-28 DIAGNOSIS — I480 Paroxysmal atrial fibrillation: Secondary | ICD-10-CM

## 2015-09-28 LAB — CUP PACEART INCLINIC DEVICE CHECK: Date Time Interrogation Session: 20161114114902

## 2015-09-28 NOTE — Progress Notes (Signed)
Loop wound check in clinic.  Steri strips removed, wound well healed, without redness, swelling or drainage. Pt with 177 AF episodes (30.9%)- EGMs show sinus with PACs (confirmed by JA). Pt taking Eliquis 5mg  BID. [facility] Summary Reports monthly (instructed pt to send manual transmission to initiate summary reports). ROV with AF clinic 10/05/15.

## 2015-10-01 ENCOUNTER — Encounter: Payer: Self-pay | Admitting: Cardiology

## 2015-10-05 ENCOUNTER — Ambulatory Visit (HOSPITAL_COMMUNITY)
Admission: RE | Admit: 2015-10-05 | Discharge: 2015-10-05 | Disposition: A | Discharge status: Home / Self Care | Payer: PPO | Source: Ambulatory Visit | Attending: Nurse Practitioner | Admitting: Nurse Practitioner

## 2015-10-05 ENCOUNTER — Encounter (HOSPITAL_COMMUNITY): Payer: Self-pay | Admitting: Nurse Practitioner

## 2015-10-05 VITALS — BP 118/78 | HR 75 | Ht 72.0 in | Wt 246.0 lb

## 2015-10-05 DIAGNOSIS — I350 Nonrheumatic aortic (valve) stenosis: Secondary | ICD-10-CM | POA: Insufficient documentation

## 2015-10-05 DIAGNOSIS — I481 Persistent atrial fibrillation: Secondary | ICD-10-CM | POA: Diagnosis not present

## 2015-10-05 DIAGNOSIS — I4819 Other persistent atrial fibrillation: Secondary | ICD-10-CM

## 2015-10-05 NOTE — Progress Notes (Signed)
Patient ID: Jeremy Tucker, male   DOB: 1951/09/05, 64 y.o.   MRN: [id number]     Primary Care Physician: [name], MD Referring Physician: Dr. Quentin Mulling is a 64 y.o. male with a h/o persistent afib, s/p ablation, 03/12/15, and recently had a linq monitor placed,11/1, for some ongoing palpitations. Site healed and remote report showed PAC's but no significant afib burden. He still reports that he has some weak mornings and has to go back to bed. He was encouraged to send a report when that occurs again to see if rhythm has anything to do with these spells.  Today, he denies symptoms of palpitations, chest pain, shortness of breath, orthopnea, PND, lower extremity edema, dizziness, presyncope, syncope, or neurologic sequela. The patient is tolerating medications without difficulties and is otherwise without complaint today.   Past Medical History  Diagnosis Date  . Hyperlipidemia   . GERD (gastroesophageal reflux disease)   . BPH (benign prostatic hyperplasia)   . Gout     denies  . Obesity   . Persistent atrial fibrillation ([place])     a. CHADsVASc score at least 2  . Aortic stenosis, mild   . Kidney stones   . DVT (deep venous thrombosis) ([place])   . Depression   . Hypertension     denies  . Glucose intolerance (impaired glucose tolerance)   . DJD (degenerative joint disease)   . Dysrhythmia     Atrial fibrillation   Past Surgical History  Procedure Laterality Date  . Colonoscopy  2010    MEDOFF  . Total knee arthroplasty Left 2001    MURPHY  . Tonsillectomy    . Joint replacement    . Knee arthroscopy Bilateral "multiple times"  . Cystoscopy w/ stone manipulation  1980's    "couldn't get stone so they had to cut me open"  . Kidney stone surgery  1980's    "stone lodged in the duct; had to cut me open to get it"  . Fracture surgery    . Forearm fracture surgery Right ~ 1961  . Cardiac catheterization  1990's    "Dr. Melvern Banker", no blockages per pt  .  Cardioversion N/A 02/25/2015    Procedure: CARDIOVERSION;  Surgeon: Sueanne Margarita, MD;  Location[facility] ENDOSCOPY;  Service: Cardiovascular;  Laterality: N/A;  . Atrial fibrillation ablation N/A 03/12/2015    Procedure: ATRIAL FIBRILLATION ABLATION;  Surgeon: [name], MD;  Location: [facility] CATH LAB;  Service: Cardiovascular;  Laterality: N/A;  . Tee without cardioversion N/A 03/12/2015    Procedure: TRANSESOPHAGEAL ECHOCARDIOGRAM (TEE);  Surgeon: Pixie Casino, MD;  Location: [facility] ENDOSCOPY;  Service: Cardiovascular;  Laterality: N/A;  . Cystoscopy with retrograde pyelogram, ureteroscopy and stent placement Left 04/24/2015    Procedure: URETHRAL MEATAL DILATION, CYSTOSCOPY WITH LEFT RETROGRADE PYELOGRAM, LEFT URETEROSCOPY, STONE BASKET EXTRACTION,  LEFT DOUBLE J STENT PLACEMENT;  Surgeon: Carolan Clines, MD;  Location: [facility] ORS;  Service: Urology;  Laterality: Left;  . Holmium laser application Left [id number]    Procedure: HOLMIUM LASER APPLICATION;  Surgeon: Carolan Clines, MD;  Location: [facility] ORS;  Service: Urology;  Laterality: Left;  . Ep implantable device N/A 09/15/2015    Procedure: Loop Recorder Insertion;  Surgeon: [name], MD;  Location: [place] CV LAB;  Service: Cardiovascular;  Laterality: N/A;    Current Outpatient Prescriptions  Medication Sig Dispense Refill  . acetaminophen (TYLENOL) 500 MG tablet Take 500-1,000 mg by mouth every 6 (six) hours as needed  for headache.    . albuterol (PROVENTIL HFA;VENTOLIN HFA) 108 (90 BASE) MCG/ACT inhaler Inhale 2 puffs into the lungs every 6 (six) hours as needed for wheezing or shortness of breath.    . allopurinol (ZYLOPRIM) 300 MG tablet Take 300 mg by mouth daily.      Marland Kitchen ammonium lactate (LAC-HYDRIN) 12 % lotion Apply 1 application topically daily as needed for dry skin.    Marland Kitchen apixaban (ELIQUIS) 5 MG TABS tablet Take 5 mg by mouth 2 (two) times daily.    Marland Kitchen atorvastatin (LIPITOR) 20 MG tablet TAKE 1 TABLET BY MOUTH EVERY DAY (Patient  taking differently: TAKE 1 TABLET BY MOUTH DAILY AT BEDTIME) 90 tablet PRN  . buPROPion (WELLBUTRIN XL) 300 MG 24 hr tablet TAKE 1 TABLET BY MOUTH DAILY 90 tablet 0  . busPIRone (BUSPAR) 10 MG tablet Take 10 mg by mouth 2 (two) times daily.     . busPIRone (BUSPAR) 10 MG tablet TAKE 1 TABLET BY MOUTH THREE TIMES DAILY 60 tablet 1  . diltiazem (CARDIZEM CD) 180 MG 24 hr capsule Take 1 capsule (180 mg total) by mouth 2 (two) times daily. 180 capsule 3  . diltiazem (CARDIZEM) 30 MG tablet SEE NOTES (Patient taking differently: TAKE 1 TABLET BY MOUTH  DAILY AS NEEDED FOR PULSE RATE OF 100 OR MORE) 45 tablet 1  . fish oil-omega-3 fatty acids 1000 MG capsule Take 1 g by mouth daily.     Marland Kitchen ketorolac (ACULAR) 0.4 % SOLN Place 1 drop into both eyes 4 (four) times daily. Stop date 09/16/15    . lisinopril-hydrochlorothiazide (PRINZIDE,ZESTORETIC) 10-12.5 MG per tablet Take 0.5 tablets by mouth daily.    Marland Kitchen loratadine (CLARITIN) 10 MG tablet Take 10 mg by mouth daily.    Marland Kitchen LORazepam (ATIVAN) 0.5 MG tablet TAKE 1 TABLET BY MOUTH TWICE DAILY AS NEEDED FOR ANXIETY (Patient taking differently: TAKE 1/2 TO 1 TABLET BY MOUTH TWICE DAILY AS NEEDED FOR ANXIETY) 20 tablet 0  . metoprolol succinate (TOPROL XL) 25 MG 24 hr tablet Take 1 tablet (25 mg total) by mouth daily. (Patient taking differently: Take 25 mg by mouth daily at 6 PM. ) 30 tablet 2  . mometasone (ASMANEX) 220 MCG/INH inhaler Inhale 2 puffs into the lungs 2 (two) times daily as needed (wheezing from bronchitis).     . Multiple Vitamins-Minerals (MULTIVITAMIN WITH MINERALS) tablet Take 1 tablet by mouth daily.      Marland Kitchen ofloxacin (OCUFLOX) 0.3 % ophthalmic solution Place 1 drop into both eyes 4 (four) times daily. Stop date 09/16/15  1  . olopatadine (PATANOL) 0.1 % ophthalmic solution Place 1 drop into both eyes daily as needed (seasonal allergies). 5 mL 11  . omeprazole (PRILOSEC OTC) 20 MG tablet Take 20 mg by mouth daily.    . Potassium Citrate 15 MEQ (1620  MG) TBCR Take 1 tablet by mouth 2 (two) times daily.     . prednisoLONE acetate (PRED FORTE) 1 % ophthalmic suspension Place 1 drop into both eyes See admin instructions. Starting 08/27/15: instill 1 drop into LEFT eye 4 times a day for one week, then 3 times a day for one week, then 2 times a day for one week, then 1 time a day for one week, then stop. Starting 09/10/15: instill 1 drop into RIGHT eye 4 times a day for one week, then 3 times a day for one week, then 2 times a day for one week, then 1 time a day for one  week, then stop.  1  . silodosin (RAPAFLO) 4 MG CAPS capsule Take 4 mg by mouth at bedtime.     Marland Kitchen zolpidem (AMBIEN) 10 MG tablet Take 1 tablet (10 mg total) by mouth at bedtime as needed for sleep. 90 tablet 0   No current facility-administered medications for this encounter.    Allergies  Allergen Reactions  . Codeine Itching  . Oxycodone Itching    Social History   Social History  . Marital Status: Married    Spouse Name: N/A  . Number of Children: N/A  . Years of Education: N/A   Occupational History  . Not on file.   Social History Main Topics  . Smoking status: Never Smoker   . Smokeless tobacco: Never Used  . Alcohol Use: Yes     Comment: 01/21/2015 "maybe 4-5 drinks/yr"  . Drug Use: No  . Sexual Activity: Yes   Other Topics Concern  . Not on file   Social History Narrative   Pt lives in [place] with spouse.  58 yo son is health.  Second son died in car accident.      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 Heart attack Mother 74    MI  . Heart attack Father 37    ? MI versus trauma to head  . Healthy Brother     ROS- All systems are reviewed and negative except as per the HPI above  Physical Exam: Filed Vitals:   10/05/15 0843  BP: 118/78  Pulse: 75  Height: 6' (1.829 m)  Weight: 246 lb (111.585 kg)    GEN- The patient is well appearing, alert and oriented x 3 today.   Head-  normocephalic, atraumatic Eyes-  Sclera clear, conjunctiva pink Ears- hearing intact Oropharynx- clear Neck- supple, no JVP Lymph- no cervical lymphadenopathy Lungs- Clear to ausculation bilaterally, normal work of breathing Heart- Regular rate and rhythm, 2/6  murmurs, rubs or gallops, PMI not laterally displaced GI- soft, NT, ND, + BS Extremities- no clubbing, cyanosis, or edema MS- no significant deformity or atrophy Skin- no rash or lesion Psych- euthymic mood, full affect Neuro- strength and sensation are intact  EKG- NSR, rate 75 bpm, pr int 174 ms, qrs int 90 ms, qtc 426 ms. Epic records reviewed.  Assessment and Plan: 1. Afib In SR  Linq in place Reminded to send a report for those days that he feels weak. Continue eliquis Continue cardizem  2. Aortic stenosis Followed by Dr. Tamala Julian Appointment pending 11/30  F/u in afib clinic in 3 months.

## 2015-10-06 ENCOUNTER — Ambulatory Visit (HOSPITAL_COMMUNITY): Payer: PPO | Admitting: Nurse Practitioner

## 2015-10-14 ENCOUNTER — Encounter: Payer: Self-pay | Admitting: Interventional Cardiology

## 2015-10-14 ENCOUNTER — Ambulatory Visit (INDEPENDENT_AMBULATORY_CARE_PROVIDER_SITE_OTHER): Payer: PPO | Admitting: Interventional Cardiology

## 2015-10-14 VITALS — BP 124/68 | HR 68 | Ht 72.0 in | Wt 242.6 lb

## 2015-10-14 DIAGNOSIS — G4733 Obstructive sleep apnea (adult) (pediatric): Secondary | ICD-10-CM

## 2015-10-14 DIAGNOSIS — I10 Essential (primary) hypertension: Secondary | ICD-10-CM

## 2015-10-14 DIAGNOSIS — E785 Hyperlipidemia, unspecified: Secondary | ICD-10-CM

## 2015-10-14 DIAGNOSIS — I481 Persistent atrial fibrillation: Secondary | ICD-10-CM

## 2015-10-14 DIAGNOSIS — I350 Nonrheumatic aortic (valve) stenosis: Secondary | ICD-10-CM

## 2015-10-14 DIAGNOSIS — I4819 Other persistent atrial fibrillation: Secondary | ICD-10-CM

## 2015-10-14 DIAGNOSIS — I5189 Other ill-defined heart diseases: Secondary | ICD-10-CM

## 2015-10-14 DIAGNOSIS — I519 Heart disease, unspecified: Secondary | ICD-10-CM

## 2015-10-14 DIAGNOSIS — Z7901 Long term (current) use of anticoagulants: Secondary | ICD-10-CM

## 2015-10-14 NOTE — Progress Notes (Signed)
Cardiology Office Note   Date:  10/14/2015   ID:  COLTER PAUMEN, DOB [birth year], MRN [id number]  PCP:  [name], MD  Cardiologist:  [name], MD   Chief Complaint  Patient presents with  . Atrial Fibrillation      History of Present Illness: Jeremy Tucker is a 64 y.o. male who presents for paroxysmal atrial fibrillation, status post ablation in April 2016, hypertension, mild aortic stenosis, gastroesophageal reflux, hyperlipidemia, and anxiety disorder. Chronic anticoagulation therapy.  He is doing relatively well from cardiac standpoint. He has had no atrial fibrillation since ablation in April high ever he does complain of irregular heartbeat. This led to implantation of a Linq Loop recorder recently. No palpitations from that procedure. He is still on both diltiazem and low-dose beta blocker therapy to suppress arrhythmia. He has had no definitive episode of atrial fibrillation since ablation.  New concerns today include a feeling of discomfort that starts in the epigastrium and goes up into his neck. The discomfort can last several hours after develops. It is nonexertional and not associated with other complaint such as dyspnea or diaphoresis. When his present physical activity does not aggravate it. Physical activity as never precipitated the discomfort.    Past Medical History  Diagnosis Date  . Hyperlipidemia   . GERD (gastroesophageal reflux disease)   . BPH (benign prostatic hyperplasia)   . Gout     denies  . Obesity   . Persistent atrial fibrillation ([place])     a. CHADsVASc score at least 2  . Aortic stenosis, mild   . Kidney stones   . DVT (deep venous thrombosis) ([place])   . Depression   . Hypertension     denies  . Glucose intolerance (impaired glucose tolerance)   . DJD (degenerative joint disease)   . Dysrhythmia     Atrial fibrillation    Past Surgical History  Procedure Laterality Date  . Colonoscopy  2010    MEDOFF  . Total  knee arthroplasty Left 2001    MURPHY  . Tonsillectomy    . Joint replacement    . Knee arthroscopy Bilateral "multiple times"  . Cystoscopy w/ stone manipulation  1980's    "couldn't get stone so they had to cut me open"  . Kidney stone surgery  1980's    "stone lodged in the duct; had to cut me open to get it"  . Fracture surgery    . Forearm fracture surgery Right ~ 1961  . Cardiac catheterization  1990's    "Dr. Melvern Banker", no blockages per pt  . Cardioversion N/A 02/25/2015    Procedure: CARDIOVERSION;  Surgeon: Sueanne Margarita, MD;  Location: [facility] ENDOSCOPY;  Service: Cardiovascular;  Laterality: N/A;  . Atrial fibrillation ablation N/A 03/12/2015    Procedure: ATRIAL FIBRILLATION ABLATION;  Surgeon: [name], MD;  Location: [facility] CATH LAB;  Service: Cardiovascular;  Laterality: N/A;  . Tee without cardioversion N/A 03/12/2015    Procedure: TRANSESOPHAGEAL ECHOCARDIOGRAM (TEE);  Surgeon: Pixie Casino, MD;  Location: [facility] ENDOSCOPY;  Service: Cardiovascular;  Laterality: N/A;  . Cystoscopy with retrograde pyelogram, ureteroscopy and stent placement Left 04/24/2015    Procedure: URETHRAL MEATAL DILATION, CYSTOSCOPY WITH LEFT RETROGRADE PYELOGRAM, LEFT URETEROSCOPY, STONE BASKET EXTRACTION,  LEFT DOUBLE J STENT PLACEMENT;  Surgeon: Carolan Clines, MD;  Location: [facility] ORS;  Service: Urology;  Laterality: Left;  . Holmium laser application Left [id number]    Procedure: HOLMIUM LASER APPLICATION;  Surgeon: Carolan Clines, MD;  Location: [facility] ORS;  Service: Urology;  Laterality: Left;  . Ep implantable device N/A 09/15/2015    Procedure: Loop Recorder Insertion;  Surgeon: [name], MD;  Location: [place] CV LAB;  Service: Cardiovascular;  Laterality: N/A;     Current Outpatient Prescriptions  Medication Sig Dispense Refill  . acetaminophen (TYLENOL) 500 MG tablet Take 500-1,000 mg by mouth every 6 (six) hours as needed for headache.    . albuterol (PROVENTIL HFA;VENTOLIN HFA) 108 (90  BASE) MCG/ACT inhaler Inhale 2 puffs into the lungs every 6 (six) hours as needed for wheezing or shortness of breath.    . allopurinol (ZYLOPRIM) 300 MG tablet Take 300 mg by mouth daily.      Marland Kitchen ammonium lactate (LAC-HYDRIN) 12 % lotion Apply 1 application topically daily as needed for dry skin.    Marland Kitchen apixaban (ELIQUIS) 5 MG TABS tablet Take 5 mg by mouth 2 (two) times daily.    Marland Kitchen atorvastatin (LIPITOR) 20 MG tablet TAKE 1 TABLET BY MOUTH EVERY DAY (Patient taking differently: TAKE 1 TABLET BY MOUTH DAILY AT BEDTIME) 90 tablet PRN  . buPROPion (WELLBUTRIN XL) 300 MG 24 hr tablet TAKE 1 TABLET BY MOUTH DAILY 90 tablet 0  . busPIRone (BUSPAR) 10 MG tablet Take 10 mg by mouth 2 (two) times daily.     Marland Kitchen diltiazem (CARDIZEM CD) 180 MG 24 hr capsule Take 1 capsule (180 mg total) by mouth 2 (two) times daily. 180 capsule 3  . diltiazem (CARDIZEM) 30 MG tablet SEE NOTES (Patient taking differently: TAKE 1 TABLET BY MOUTH  DAILY AS NEEDED FOR PULSE RATE OF 100 OR MORE) 45 tablet 1  . fish oil-omega-3 fatty acids 1000 MG capsule Take 1 g by mouth daily.     Marland Kitchen ketorolac (ACULAR) 0.4 % SOLN Place 1 drop into both eyes 4 (four) times daily. Stop date 09/16/15    . lisinopril-hydrochlorothiazide (PRINZIDE,ZESTORETIC) 10-12.5 MG per tablet Take 0.5 tablets by mouth daily.    Marland Kitchen loratadine (CLARITIN) 10 MG tablet Take 10 mg by mouth daily.    Marland Kitchen LORazepam (ATIVAN) 0.5 MG tablet TAKE 1 TABLET BY MOUTH TWICE DAILY AS NEEDED FOR ANXIETY (Patient taking differently: TAKE 1/2 TO 1 TABLET BY MOUTH TWICE DAILY AS NEEDED FOR ANXIETY) 20 tablet 0  . metoprolol succinate (TOPROL XL) 25 MG 24 hr tablet Take 1 tablet (25 mg total) by mouth daily. (Patient taking differently: Take 25 mg by mouth daily at 6 PM. ) 30 tablet 2  . mometasone (ASMANEX) 220 MCG/INH inhaler Inhale 2 puffs into the lungs 2 (two) times daily as needed (wheezing from bronchitis).     . Multiple Vitamins-Minerals (MULTIVITAMIN WITH MINERALS) tablet Take 1  tablet by mouth daily.      Marland Kitchen ofloxacin (OCUFLOX) 0.3 % ophthalmic solution Place 1 drop into both eyes 4 (four) times daily. Stop date 09/16/15  1  . olopatadine (PATANOL) 0.1 % ophthalmic solution Place 1 drop into both eyes daily as needed (seasonal allergies). 5 mL 11  . omeprazole (PRILOSEC OTC) 20 MG tablet Take 20 mg by mouth daily.    . Potassium Citrate 15 MEQ (1620 MG) TBCR Take 1 tablet by mouth 2 (two) times daily.     . prednisoLONE acetate (PRED FORTE) 1 % ophthalmic suspension Place 1 drop into both eyes See admin instructions. Starting 08/27/15: instill 1 drop into LEFT eye 4 times a day for one week, then 3 times a day for one week, then 2 times a  day for one week, then 1 time a day for one week, then stop. Starting 09/10/15: instill 1 drop into RIGHT eye 4 times a day for one week, then 3 times a day for one week, then 2 times a day for one week, then 1 time a day for one week, then stop.  1  . silodosin (RAPAFLO) 4 MG CAPS capsule Take 4 mg by mouth at bedtime.     Marland Kitchen zolpidem (AMBIEN) 10 MG tablet Take 1 tablet (10 mg total) by mouth at bedtime as needed for sleep. 90 tablet 0   No current facility-administered medications for this visit.    Allergies:   Codeine and Oxycodone    Social History:  The patient  reports that he has never smoked. He has never used smokeless tobacco. He reports that he drinks alcohol. He reports that he does not use illicit drugs.   Family History:  The patient's family history includes Healthy in his brother; Heart attack (age of onset: 25) in his father; Heart attack (age of onset: 20) in his mother.    ROS:  Please see the history of present illness.   Otherwise, review of systems are positive for complaining of recurring headaches. No head trauma. Concerned about recurring episodes of chest discomfort. Difficulty sleeping. Anxiety about his overall health..   All other systems are reviewed and negative.    PHYSICAL EXAM: VS:  BP 124/68 mmHg   Pulse 68  Ht 6' (1.829 m)  Wt 242 lb 9.6 oz (110.043 kg)  BMI 32.90 kg/m2 , BMI Body mass index is 32.9 kg/(m^2). GEN: Well nourished, well developed, in no acute distress. Skin is somewhat diaphoretic. HEENT: normal Neck: no JVD, carotid bruits, or masses Cardiac: RRR.  There is no murmur, rub, or gallop. There is no edema. Respiratory:  clear to auscultation bilaterally, normal work of breathing. GI: soft, nontender, nondistended, + BS MS: no deformity or atrophy Skin: warm and dry, no rash Neuro:  Strength and sensation are intact Psych: euthymic mood, full affect   EKG:  EKG is not ordered today.    Recent Labs: 01/26/2015: B Natriuretic Peptide 121.4*; Magnesium 1.8; TSH 1.309 04/04/2015: ALT 39 04/22/2015: BUN 17; Creatinine, Ser 0.65; Hemoglobin 13.8; Platelets 221; Potassium 3.8; Sodium 137    Lipid Panel    Component Value Date/Time   CHOL 153 09/23/2014 1007   TRIG 234* 09/23/2014 1007   HDL 43 09/23/2014 1007   CHOLHDL 3.6 09/23/2014 1007   VLDL 47* 09/23/2014 1007   LDLCALC 63 09/23/2014 1007      Wt Readings from Last 3 Encounters:  10/14/15 242 lb 9.6 oz (110.043 kg)  10/05/15 246 lb (111.585 kg)  09/15/15 235 lb (106.595 kg)      Other studies Reviewed: Additional studies/ records that were reviewed today include: Reviewed recent atrial fibrillation clinic note.. The findings include no new data. With concern about the patient's epigastric and neck discomfort, I reviewed records and realizes that we have performed a myocardial perfusion study in March 2016 that was normal..    ASSESSMENT AND PLAN:  1. Persistent atrial fibrillation (HCC) Resolved status post ablation.  2. Essential hypertension Well controlled.  3. Aortic stenosis, moderate Mild to moderate by recent echo.  4. OSA (obstructive sleep apnea) Unable to treat was C Pap due to claustrophobia.  5. Hyperlipidemia LDL goal <70 On therapy.  6. Diastolic dysfunction-grade 15 Sep 2014 No evidence of volume overload.  7. Chronic anticoagulation therapy. No complications.  LENGTHY office visit due to multiple questions and counseling. Encounter lasted greater than 40 minutes with greater than 50% related to counseling.  Current medicines are reviewed at length with the patient today.  The patient has the following concerns regarding medicines: .  The following changes/actions have been instituted:    Continue to be physically active  Consider GI evaluation for recent chest discomfort  If he develops exertional discomfort will have to reconsider exercises nuclear perfusion imaging  No change in medical regimen at this time  Labs/ tests ordered today include:  No orders of the defined types were placed in this encounter.     Disposition:   FU with HS in 9 months  Signed, [name], MD  10/14/2015 11:30 AM    [address], [place], [place]  [address] Phone: [phone]; Fax: [phone]

## 2015-10-14 NOTE — Patient Instructions (Signed)
Medication Instructions:  Your physician recommends that you continue on your current medications as directed. Please refer to the Current Medication list given to you today.   Labwork: None ordered  Testing/Procedures: None ordered  Follow-Up: Your physician wants you to follow-up in: 9 months with Dr.Smith You will receive a reminder letter in the mail two months in advance. If you don't receive a letter, please call our office to schedule the follow-up appointment.   Any Other Special Instructions Will Be Listed Below (If Applicable).     If you need a refill on your cardiac medications before your next appointment, please call your pharmacy.   

## 2015-10-15 ENCOUNTER — Ambulatory Visit (INDEPENDENT_AMBULATORY_CARE_PROVIDER_SITE_OTHER): Payer: PPO | Admitting: *Deleted

## 2015-10-15 DIAGNOSIS — I4819 Other persistent atrial fibrillation: Secondary | ICD-10-CM

## 2015-10-15 DIAGNOSIS — I481 Persistent atrial fibrillation: Secondary | ICD-10-CM

## 2015-10-16 ENCOUNTER — Encounter: Payer: Self-pay | Admitting: Internal Medicine

## 2015-10-16 NOTE — Progress Notes (Signed)
Carelink Summary Report / Loop Recorder 

## 2015-10-21 ENCOUNTER — Telehealth: Payer: Self-pay | Admitting: Family Medicine

## 2015-10-21 NOTE — Telephone Encounter (Signed)
Recvd refill request for Zolpidem #90

## 2015-10-22 ENCOUNTER — Other Ambulatory Visit: Payer: Self-pay | Admitting: Family Medicine

## 2015-10-22 NOTE — Telephone Encounter (Signed)
Ok to refill 

## 2015-10-22 NOTE — Telephone Encounter (Signed)
Have him come in for an appointment to discuss his sleep issue

## 2015-10-23 ENCOUNTER — Encounter: Payer: Self-pay | Admitting: Cardiology

## 2015-10-26 ENCOUNTER — Encounter: Payer: Self-pay | Admitting: Family Medicine

## 2015-10-26 ENCOUNTER — Ambulatory Visit (INDEPENDENT_AMBULATORY_CARE_PROVIDER_SITE_OTHER): Payer: BLUE CROSS/BLUE SHIELD | Admitting: Family Medicine

## 2015-10-26 VITALS — BP 120/82 | HR 76 | Temp 98.2°F | Ht 72.0 in | Wt 242.8 lb

## 2015-10-26 DIAGNOSIS — F418 Other specified anxiety disorders: Secondary | ICD-10-CM | POA: Diagnosis not present

## 2015-10-26 DIAGNOSIS — I4891 Unspecified atrial fibrillation: Secondary | ICD-10-CM

## 2015-10-26 NOTE — Progress Notes (Signed)
   Subjective:    Patient ID: Jeremy Tucker, male    DOB: [birth year], 64 y.o.   MRN: [id number]  HPI  he is here for consult concerning sleep issues. Presently he is aching Ambien fairly regularly and is also on Wellbutrin and BuSpar. In spite of this he still is having difficulty with anxiety that is intermittent in nature. 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 Also he and his wife are in the process of trying to sell her house and downsize. He is followed by cardiology for his underlying atrial fibrillation. His sleeping habits have been quite disrupted. He sometimes does take an afternoon nap when he gets quite fatigued. Rarely uses Ativan during the day.   Review of Systems     Objective:   Physical Exam  alert and in no distress with appropriate affect       Assessment & Plan:  Atrial fibrillation with RVR (HCC)  Situational anxiety  the present drug regimen is really not working very well. I will have him taper off of Wellbutrin and BuSpar cutting both doses in half for one week and then stop. Discussed  sleephygiene with him in regard to taking  No afternoon naps and  Going to bed at roughly the same time, getting up at same time. Explained bedroom's are for only sleeping and sex. Recommend he use Ambien on an as-needed basis and to try give himself at least a half and are at night before taking the medication. Also strongly encouraged him to get involved in counseling. He will be seen by Altha Harm. Discussed the fact that he is going through a major transition going from working to retirement especially dealing with his underlying cardiac issues. I will monitor the situation but have not scheduled him back. Over 25 minutes, greater than 50% spent in counseling and coordination of care.

## 2015-10-26 NOTE — Patient Instructions (Signed)
Insomnia Insomnia is a sleep disorder that makes it difficult to fall asleep or to stay asleep. Insomnia can cause tiredness (fatigue), low energy, difficulty concentrating, mood swings, and poor performance at work or school.  There are three different ways to classify insomnia:  Difficulty falling asleep.  Difficulty staying asleep.  Waking up too early in the morning. Any type of insomnia can be long-term (chronic) or short-term (acute). Both are common. Short-term insomnia usually lasts for three months or less. Chronic insomnia occurs at least three times a week for longer than three months. CAUSES  Insomnia may be caused by another condition, situation, or substance, such as:  Anxiety.  Certain medicines.  Gastroesophageal reflux disease (GERD) or other gastrointestinal conditions.  Asthma or other breathing conditions.  Restless legs syndrome, sleep apnea, or other sleep disorders.  Chronic pain.  Menopause. This may include hot flashes.  Stroke.  Abuse of alcohol, tobacco, or illegal drugs.  Depression.  Caffeine.   Neurological disorders, such as Alzheimer disease.  An overactive thyroid (hyperthyroidism). The cause of insomnia may not be known. RISK FACTORS Risk factors for insomnia include:  Gender. Women are more commonly affected than men.  Age. Insomnia is more common as you get older.  Stress. This may involve your professional or personal life.  Income. Insomnia is more common in people with lower income.  Lack of exercise.   Irregular work schedule or night shifts.  Traveling between different time zones. SIGNS AND SYMPTOMS If you have insomnia, trouble falling asleep or trouble staying asleep is the main symptom. This may lead to other symptoms, such as:  Feeling fatigued.  Feeling nervous about going to sleep.  Not feeling rested in the morning.  Having trouble concentrating.  Feeling irritable, anxious, or depressed. TREATMENT   Treatment for insomnia depends on the cause. If your insomnia is caused by an underlying condition, treatment will focus on addressing the condition. Treatment may also include:   Medicines to help you sleep.  Counseling or therapy.  Lifestyle adjustments. HOME CARE INSTRUCTIONS   Take medicines only as directed by your health care provider.  Keep regular sleeping and waking hours. Avoid naps.  Keep a sleep diary to help you and your health care provider figure out what could be causing your insomnia. Include:   When you sleep.  When you wake up during the night.  How well you sleep.   How rested you feel the next day.  Any side effects of medicines you are taking.  What you eat and drink.   Make your bedroom a comfortable place where it is easy to fall asleep:  Put up shades or special blackout curtains to block light from outside.  Use a white noise machine to block noise.  Keep the temperature cool.   Exercise regularly as directed by your health care provider. Avoid exercising right before bedtime.  Use relaxation techniques to manage stress. Ask your health care provider to suggest some techniques that may work well for you. These may include:  Breathing exercises.  Routines to release muscle tension.  Visualizing peaceful scenes.  Cut back on alcohol, caffeinated beverages, and cigarettes, especially close to bedtime. These can disrupt your sleep.  Do not overeat or eat spicy foods right before bedtime. This can lead to digestive discomfort that can make it hard for you to sleep.  Limit screen use before bedtime. This includes:  Watching TV.  Using your smartphone, tablet, and computer.  Stick to a routine. This   can help you fall asleep faster. Try to do a quiet activity, brush your teeth, and go to bed at the same time each night.  Get out of bed if you are still awake after 15 minutes of trying to sleep. Keep the lights down, but try reading or  doing a quiet activity. When you feel sleepy, go back to bed.  Make sure that you drive carefully. Avoid driving if you feel very sleepy.  Keep all follow-up appointments as directed by your health care provider. This is important. SEEK MEDICAL CARE IF:   You are tired throughout the day or have trouble in your daily routine due to sleepiness.  You continue to have sleep problems or your sleep problems get worse. SEEK IMMEDIATE MEDICAL CARE IF:   You have serious thoughts about hurting yourself or someone else.   This information is not intended to replace advice given to you by your health care provider. Make sure you discuss any questions you have with your health care provider.   Document Released: 10/28/2000 Document Revised: 07/22/2015 Document Reviewed: 08/01/2014 Elsevier Interactive Patient Education 2016 Elsevier Inc.  

## 2015-10-27 ENCOUNTER — Other Ambulatory Visit: Payer: Self-pay | Admitting: Family Medicine

## 2015-10-27 ENCOUNTER — Telehealth: Payer: Self-pay | Admitting: Family Medicine

## 2015-10-27 MED ORDER — ZOLPIDEM TARTRATE 10 MG PO TABS: 10.0000 mg | ORAL_TABLET | Freq: Every evening | ORAL | Status: DC | PRN

## 2015-10-27 NOTE — Telephone Encounter (Signed)
Pt wanted to make sure that Ambien refill would be sent to pharmacy

## 2015-10-27 NOTE — Telephone Encounter (Signed)
Call in the Ambien and let him know

## 2015-10-29 ENCOUNTER — Encounter: Payer: Self-pay | Admitting: Cardiology

## 2015-11-02 ENCOUNTER — Telehealth: Payer: Self-pay | Admitting: Interventional Cardiology

## 2015-11-02 NOTE — Telephone Encounter (Signed)
New message     Patient calling the office for samples of medication:   1.  What medication and dosage are you requesting samples for? eliquis 5mg  2.  Are you currently out of this medication?  Have a few pills left

## 2015-11-03 NOTE — Telephone Encounter (Signed)
Called pt to inform him that we did not have any samples of eliquis 5 mg tablets and that he could call back tomorrow to see if we have gotten any samples in. Pt verbalized understanding.

## 2015-11-05 ENCOUNTER — Encounter: Payer: Self-pay | Admitting: Cardiology

## 2015-11-13 ENCOUNTER — Other Ambulatory Visit: Payer: Self-pay | Admitting: Family Medicine

## 2015-11-15 ENCOUNTER — Other Ambulatory Visit (HOSPITAL_COMMUNITY): Payer: Self-pay | Admitting: Nurse Practitioner

## 2015-11-17 ENCOUNTER — Ambulatory Visit (INDEPENDENT_AMBULATORY_CARE_PROVIDER_SITE_OTHER): Payer: PPO | Admitting: *Deleted

## 2015-11-17 DIAGNOSIS — I480 Paroxysmal atrial fibrillation: Secondary | ICD-10-CM | POA: Diagnosis not present

## 2015-11-17 DIAGNOSIS — R1310 Dysphagia, unspecified: Secondary | ICD-10-CM | POA: Diagnosis not present

## 2015-11-17 DIAGNOSIS — K210 Gastro-esophageal reflux disease with esophagitis: Secondary | ICD-10-CM | POA: Diagnosis not present

## 2015-11-17 DIAGNOSIS — R1013 Epigastric pain: Secondary | ICD-10-CM | POA: Diagnosis not present

## 2015-11-17 DIAGNOSIS — T182XXA Foreign body in stomach, initial encounter: Secondary | ICD-10-CM | POA: Diagnosis not present

## 2015-11-17 DIAGNOSIS — K449 Diaphragmatic hernia without obstruction or gangrene: Secondary | ICD-10-CM | POA: Diagnosis not present

## 2015-11-17 DIAGNOSIS — K2980 Duodenitis without bleeding: Secondary | ICD-10-CM | POA: Diagnosis not present

## 2015-11-17 DIAGNOSIS — K209 Esophagitis, unspecified: Secondary | ICD-10-CM | POA: Diagnosis not present

## 2015-11-17 NOTE — Progress Notes (Signed)
Carelink Summary Report / Loop Recorder 

## 2015-11-18 ENCOUNTER — Telehealth: Payer: Self-pay | Admitting: Interventional Cardiology

## 2015-11-18 ENCOUNTER — Telehealth: Payer: Self-pay | Admitting: *Deleted

## 2015-11-18 MED ORDER — APIXABAN 5 MG PO TABS: 5.0000 mg | ORAL_TABLET | Freq: Two times a day (BID) | ORAL | Status: DC

## 2015-11-18 NOTE — Telephone Encounter (Signed)
Can also use Cell [phone]  Patient calling the office for samples of medication:   1.  What medication and dosage are you requesting samples for? Eliquis 5 mg  2.  Are you currently out of this medication? States he has only couple left

## 2015-11-18 NOTE — Telephone Encounter (Signed)
pt is established on eliquis, explained to him that our samples are for pts starting on eliquis and that i could send him in a refill and give his information to pt asst, he choose the refills.

## 2015-11-29 ENCOUNTER — Other Ambulatory Visit: Payer: Self-pay | Admitting: Family Medicine

## 2015-11-30 NOTE — Telephone Encounter (Signed)
Is this okay to refill? 

## 2015-12-10 ENCOUNTER — Other Ambulatory Visit: Payer: Self-pay | Admitting: Physical Medicine and Rehabilitation

## 2015-12-10 ENCOUNTER — Telehealth: Payer: Self-pay | Admitting: Internal Medicine

## 2015-12-10 DIAGNOSIS — M79602 Pain in left arm: Secondary | ICD-10-CM

## 2015-12-10 DIAGNOSIS — M542 Cervicalgia: Secondary | ICD-10-CM

## 2015-12-10 NOTE — Telephone Encounter (Signed)
Spoke with Jeremy Tucker and [facility]. She seems to think clearance has already been obtained. I gave her [employer] # [phone] and device clinic number if there are further questions.

## 2015-12-10 NOTE — Telephone Encounter (Signed)
Request for surgical clearance:  1. What type of surgery is being performed? MRI with [facility] imaging of Neck Cervical  2. When is this surgery scheduled? MRI scheduled on 12/15/2015  3. Name of physician performing surgery? Dr. Ron Agee   4. What is your office phone and fax number? Telephone [phone] Fax: [phone]   Comment: Pt has a "Chip" for a afib. Will need information on this Chip to move forward.

## 2015-12-13 ENCOUNTER — Ambulatory Visit
Admission: RE | Admit: 2015-12-13 | Discharge: 2015-12-13 | Disposition: A | Discharge status: Home / Self Care | Payer: PPO | Source: Ambulatory Visit | Attending: Physical Medicine and Rehabilitation | Admitting: Physical Medicine and Rehabilitation

## 2015-12-13 DIAGNOSIS — M50222 Other cervical disc displacement at C5-C6 level: Secondary | ICD-10-CM | POA: Diagnosis not present

## 2015-12-13 DIAGNOSIS — M542 Cervicalgia: Secondary | ICD-10-CM

## 2015-12-13 DIAGNOSIS — M79602 Pain in left arm: Secondary | ICD-10-CM

## 2015-12-14 ENCOUNTER — Ambulatory Visit (INDEPENDENT_AMBULATORY_CARE_PROVIDER_SITE_OTHER): Payer: PPO | Admitting: *Deleted

## 2015-12-14 DIAGNOSIS — I480 Paroxysmal atrial fibrillation: Secondary | ICD-10-CM | POA: Diagnosis not present

## 2015-12-14 NOTE — Progress Notes (Signed)
Carelink Summary Report / Loop Recorder 

## 2015-12-15 ENCOUNTER — Other Ambulatory Visit: Payer: PPO

## 2015-12-15 LAB — CUP PACEART REMOTE DEVICE CHECK: Date Time Interrogation Session: 20161201110825

## 2015-12-17 ENCOUNTER — Other Ambulatory Visit: Payer: Self-pay | Admitting: *Deleted

## 2015-12-17 ENCOUNTER — Other Ambulatory Visit: Payer: Self-pay | Admitting: Family Medicine

## 2015-12-17 MED ORDER — ZOLPIDEM TARTRATE 10 MG PO TABS: 10.0000 mg | ORAL_TABLET | Freq: Every evening | ORAL | Status: DC | PRN

## 2015-12-17 NOTE — Telephone Encounter (Signed)
Ambien 10mg phoned in.

## 2015-12-17 NOTE — Telephone Encounter (Signed)
OK to renew 

## 2015-12-17 NOTE — Telephone Encounter (Signed)
Requesting refill on Ambien 10 mg

## 2015-12-18 ENCOUNTER — Telehealth: Payer: Self-pay | Admitting: Internal Medicine

## 2015-12-18 NOTE — Telephone Encounter (Signed)
1. What  office are you calling from? [facility]    2. What is your office phone and fax number? [phone]  3. What type of procedure is the patient having performed? Epidural injection    4. What date is procedure scheduled? Pending   5. What is your question (ex. Antibiotics prior to procedure, holding medication-we need to know how long dentist wants pt to hold med)? Hold Eliqius 6.

## 2015-12-21 ENCOUNTER — Telehealth: Payer: Self-pay | Admitting: Interventional Cardiology

## 2015-12-21 NOTE — Telephone Encounter (Signed)
Pt is on Eliquis for afib with CHADS2 score of 1 (HTN). Does have remote history of DVT - called pt and he confirms that he had only 1 DVT about 10 years ago. Ok to hold Eliquis x 3 days prior to epidural injection per protocol and restart 24 hours after procedure or as directed by MD. Pt is aware of these instructions. Clearance form faxed to Dr. Archie Endo office [phone] on 12/21/15.

## 2015-12-21 NOTE — Telephone Encounter (Signed)
Jeremy Tucker was calling in for a response from a clearance form that was sent to the office in regards to the pt's Endoscopy on 2/9. They need for him to hold his Eliquis and they need the doctor's approval. Please f/u with her

## 2015-12-22 NOTE — Telephone Encounter (Signed)
Follow up   Clearance form was sent over this week.    Request for surgical clearance:  1. What type of surgery is being performed?  Endoscopy    2. When is this surgery scheduled? 2.9.2017   3. Are there any medications that need to be held prior to surgery and how long? Patient went off eliquis due to injection    4. Name of physician performing surgery? Dr. Earlean Shawl    5. What is your office phone and fax number? Fax # [phone] / [phone]

## 2015-12-22 NOTE — Telephone Encounter (Signed)
I will forward to CVRR 

## 2015-12-22 NOTE — Telephone Encounter (Signed)
Form was faxed over yesterday, however fax number was different that this listed one. Will refax to below fax [phone].

## 2015-12-23 NOTE — Telephone Encounter (Signed)
Pt clearance faxed to Dr. Liliane Channel office

## 2015-12-23 NOTE — Telephone Encounter (Signed)
THE PATIENT IS CLEARED FOR THE UPCOMING COLONOSCOPY PROCEDURE. iF BIOPSY IS PLANNED, ANTICOAGULATION SHOULD BE STOPPED AT LEAST 48 HOURS PRIOR.

## 2015-12-24 DIAGNOSIS — K317 Polyp of stomach and duodenum: Secondary | ICD-10-CM | POA: Diagnosis not present

## 2015-12-24 DIAGNOSIS — K3189 Other diseases of stomach and duodenum: Secondary | ICD-10-CM | POA: Diagnosis not present

## 2015-12-24 DIAGNOSIS — T182XXD Foreign body in stomach, subsequent encounter: Secondary | ICD-10-CM | POA: Diagnosis not present

## 2015-12-24 DIAGNOSIS — K3184 Gastroparesis: Secondary | ICD-10-CM | POA: Diagnosis not present

## 2015-12-24 DIAGNOSIS — K449 Diaphragmatic hernia without obstruction or gangrene: Secondary | ICD-10-CM | POA: Diagnosis not present

## 2015-12-24 DIAGNOSIS — K2940 Chronic atrophic gastritis without bleeding: Secondary | ICD-10-CM | POA: Diagnosis not present

## 2015-12-24 DIAGNOSIS — D131 Benign neoplasm of stomach: Secondary | ICD-10-CM | POA: Diagnosis not present

## 2015-12-24 DIAGNOSIS — R21 Rash and other nonspecific skin eruption: Secondary | ICD-10-CM | POA: Diagnosis not present

## 2015-12-24 DIAGNOSIS — K2980 Duodenitis without bleeding: Secondary | ICD-10-CM | POA: Diagnosis not present

## 2015-12-24 DIAGNOSIS — B370 Candidal stomatitis: Secondary | ICD-10-CM | POA: Diagnosis not present

## 2015-12-24 DIAGNOSIS — K2970 Gastritis, unspecified, without bleeding: Secondary | ICD-10-CM | POA: Diagnosis not present

## 2015-12-24 DIAGNOSIS — K210 Gastro-esophageal reflux disease with esophagitis: Secondary | ICD-10-CM | POA: Diagnosis not present

## 2015-12-25 DIAGNOSIS — M5412 Radiculopathy, cervical region: Secondary | ICD-10-CM | POA: Diagnosis not present

## 2015-12-25 DIAGNOSIS — M542 Cervicalgia: Secondary | ICD-10-CM | POA: Diagnosis not present

## 2015-12-31 LAB — CUP PACEART REMOTE DEVICE CHECK
Date Time Interrogation Session: 20161231113812
Date Time Interrogation Session: 20170130113825

## 2015-12-31 NOTE — Progress Notes (Signed)
Carelink summary report received. Battery status OK. Normal device function. No new symptom episodes, tachy episodes, brady, or pause episodes. Stable AF burden, V rates controlled, +Eliquis. Monthly summary reports and ROV/PRN

## 2016-01-04 ENCOUNTER — Encounter (HOSPITAL_COMMUNITY): Payer: Self-pay | Admitting: Nurse Practitioner

## 2016-01-04 ENCOUNTER — Ambulatory Visit (HOSPITAL_COMMUNITY)
Admission: RE | Admit: 2016-01-04 | Discharge: 2016-01-04 | Disposition: A | Discharge status: Home / Self Care | Payer: PPO | Source: Ambulatory Visit | Attending: Nurse Practitioner | Admitting: Nurse Practitioner

## 2016-01-04 VITALS — BP 116/80 | HR 70 | Ht 72.0 in | Wt 242.8 lb

## 2016-01-04 DIAGNOSIS — I4891 Unspecified atrial fibrillation: Secondary | ICD-10-CM | POA: Diagnosis not present

## 2016-01-04 DIAGNOSIS — K219 Gastro-esophageal reflux disease without esophagitis: Secondary | ICD-10-CM | POA: Insufficient documentation

## 2016-01-04 DIAGNOSIS — Z8249 Family history of ischemic heart disease and other diseases of the circulatory system: Secondary | ICD-10-CM | POA: Insufficient documentation

## 2016-01-04 DIAGNOSIS — E785 Hyperlipidemia, unspecified: Secondary | ICD-10-CM | POA: Insufficient documentation

## 2016-01-04 DIAGNOSIS — Z79899 Other long term (current) drug therapy: Secondary | ICD-10-CM | POA: Insufficient documentation

## 2016-01-04 DIAGNOSIS — F329 Major depressive disorder, single episode, unspecified: Secondary | ICD-10-CM | POA: Insufficient documentation

## 2016-01-04 DIAGNOSIS — E669 Obesity, unspecified: Secondary | ICD-10-CM | POA: Diagnosis not present

## 2016-01-04 DIAGNOSIS — Z6832 Body mass index (BMI) 32.0-32.9, adult: Secondary | ICD-10-CM | POA: Diagnosis not present

## 2016-01-04 DIAGNOSIS — I480 Paroxysmal atrial fibrillation: Secondary | ICD-10-CM | POA: Diagnosis not present

## 2016-01-04 DIAGNOSIS — Z95818 Presence of other cardiac implants and grafts: Secondary | ICD-10-CM | POA: Insufficient documentation

## 2016-01-04 DIAGNOSIS — Z885 Allergy status to narcotic agent status: Secondary | ICD-10-CM | POA: Diagnosis not present

## 2016-01-04 DIAGNOSIS — Z7902 Long term (current) use of antithrombotics/antiplatelets: Secondary | ICD-10-CM | POA: Diagnosis not present

## 2016-01-04 DIAGNOSIS — I350 Nonrheumatic aortic (valve) stenosis: Secondary | ICD-10-CM | POA: Insufficient documentation

## 2016-01-04 NOTE — Progress Notes (Signed)
Patient ID: Jeremy Tucker, male   DOB: 1951-04-20, 65 y.o.   MRN: [id number]     Primary Care Physician: [name], MD Referring Physician: Dr. Quentin Mulling is a 65 y.o. male with a h/o persistent afib, s/p ablation, 03/12/15, and recently had a linq monitor placed,11/1, for some ongoing palpitations. Site healed and remote report showed PAC's but no significant afib burden. He still reports that he has some weak mornings and has to go back to bed. He was encouraged to send a report when that occurs again to see if rhythm has anything to do with these spells.  He f/u's in the afib clinic 2/20 and linq is interrogated and reviewed with Tomi Bamberger, [employer] rep. It appears that the majority of his irregular heart beat is PAC's and dicussed with pt. Overall, he is now retired 6 months and he and his wife are exercising more. He still has some days that he is more tired but cannot specifically correlate with an arrhythmia. He continues on DOAC, no bleeding issues.  Today, he denies symptoms of palpitations, chest pain, shortness of breath, orthopnea, PND, lower extremity edema, dizziness, presyncope, syncope, or neurologic sequela. The patient is tolerating medications without difficulties and is otherwise without complaint today.   Past Medical History  Diagnosis Date  . Hyperlipidemia   . GERD (gastroesophageal reflux disease)   . BPH (benign prostatic hyperplasia)   . Gout     denies  . Obesity   . Persistent atrial fibrillation ([place])     a. CHADsVASc score at least 2  . Aortic stenosis, mild   . Kidney stones   . DVT (deep venous thrombosis) ([place])   . Depression   . Hypertension     denies  . Glucose intolerance (impaired glucose tolerance)   . DJD (degenerative joint disease)   . Dysrhythmia     Atrial fibrillation   Past Surgical History  Procedure Laterality Date  . Colonoscopy  2010    [facility]  . Total knee arthroplasty Left 2001    [facility]  . Tonsillectomy     . Joint replacement    . Knee arthroscopy Bilateral "multiple times"  . Cystoscopy w/ stone manipulation  1980's    "couldn't get stone so they had to cut me open"  . Kidney stone surgery  1980's    "stone lodged in the duct; had to cut me open to get it"  . Fracture surgery    . Forearm fracture surgery Right ~ 1961  . Cardiac catheterization  1990's    "Dr. Melvern Banker", no blockages per pt  . Cardioversion N/A 02/25/2015    Procedure: CARDIOVERSION;  Surgeon: Sueanne Margarita, MD;  Location: [facility] ENDOSCOPY;  Service: Cardiovascular;  Laterality: N/A;  . Atrial fibrillation ablation N/A 03/12/2015    Procedure: ATRIAL FIBRILLATION ABLATION;  Surgeon: [name], MD;  Location: [facility] CATH LAB;  Service: Cardiovascular;  Laterality: N/A;  . Tee without cardioversion N/A 03/12/2015    Procedure: TRANSESOPHAGEAL ECHOCARDIOGRAM (TEE);  Surgeon: Pixie Casino, MD;  Location: [facility] ENDOSCOPY;  Service: Cardiovascular;  Laterality: N/A;  . Cystoscopy with retrograde pyelogram, ureteroscopy and stent placement Left 04/24/2015    Procedure: URETHRAL MEATAL DILATION, CYSTOSCOPY WITH LEFT RETROGRADE PYELOGRAM, LEFT URETEROSCOPY, STONE BASKET EXTRACTION,  LEFT DOUBLE J STENT PLACEMENT;  Surgeon: Carolan Clines, MD;  Location: [facility] ORS;  Service: Urology;  Laterality: Left;  . Holmium laser application Left [id number]    Procedure: HOLMIUM LASER APPLICATION;  Surgeon:  Carolan Clines, MD;  Location: [facility] ORS;  Service: Urology;  Laterality: Left;  . Ep implantable device N/A 09/15/2015    Procedure: Loop Recorder Insertion;  Surgeon: [name], MD;  Location: [facility] CV LAB;  Service: Cardiovascular;  Laterality: N/A;    Current Outpatient Prescriptions  Medication Sig Dispense Refill  . acetaminophen (TYLENOL) 500 MG tablet Take 500-1,000 mg by mouth every 6 (six) hours as needed for headache.    . albuterol (PROVENTIL HFA;VENTOLIN HFA) 108 (90 BASE) MCG/ACT inhaler Inhale 2 puffs into the lungs every 6  (six) hours as needed for wheezing or shortness of breath.    . allopurinol (ZYLOPRIM) 300 MG tablet Take 300 mg by mouth daily.      Marland Kitchen ammonium lactate (LAC-HYDRIN) 12 % lotion Apply 1 application topically daily as needed for dry skin.    Marland Kitchen apixaban (ELIQUIS) 5 MG TABS tablet Take 1 tablet (5 mg total) by mouth 2 (two) times daily. 60 tablet 11  . atorvastatin (LIPITOR) 20 MG tablet TAKE 1 TABLET BY MOUTH EVERY DAY (Patient taking differently: TAKE 1 TABLET BY MOUTH DAILY AT BEDTIME) 90 tablet PRN  . buPROPion (WELLBUTRIN XL) 300 MG 24 hr tablet TAKE 1 TABLET BY MOUTH DAILY 90 tablet 0  . busPIRone (BUSPAR) 10 MG tablet Take 10 mg by mouth 2 (two) times daily.     . busPIRone (BUSPAR) 10 MG tablet TAKE 1 TABLET BY MOUTH THREE TIMES DAILY 60 tablet 1  . diltiazem (CARDIZEM CD) 180 MG 24 hr capsule Take 1 capsule (180 mg total) by mouth 2 (two) times daily. 180 capsule 3  . diltiazem (CARDIZEM) 30 MG tablet SEE NOTES (Patient taking differently: TAKE 1 TABLET BY MOUTH  DAILY AS NEEDED FOR PULSE RATE OF 100 OR MORE) 45 tablet 1  . fish oil-omega-3 fatty acids 1000 MG capsule Take 1 g by mouth daily.     Marland Kitchen ketorolac (ACULAR) 0.4 % SOLN Place 1 drop into both eyes 4 (four) times daily. Stop date 09/16/15    . lisinopril-hydrochlorothiazide (PRINZIDE,ZESTORETIC) 10-12.5 MG per tablet Take 0.5 tablets by mouth daily.    Marland Kitchen lisinopril-hydrochlorothiazide (PRINZIDE,ZESTORETIC) 10-12.5 MG tablet TAKE 1 TABLET BY MOUTH ONCE DAILY 90 tablet 0  . loratadine (CLARITIN) 10 MG tablet Take 10 mg by mouth daily.    Marland Kitchen LORazepam (ATIVAN) 0.5 MG tablet TAKE 1 TABLET BY MOUTH TWICE DAILY AS NEEDED FOR ANXIETY (Patient taking differently: TAKE 1/2 TO 1 TABLET BY MOUTH TWICE DAILY AS NEEDED FOR ANXIETY) 20 tablet 0  . metoprolol succinate (TOPROL-XL) 25 MG 24 hr tablet TAKE 1 TABLET BY MOUTH DAILY 30 tablet 6  . mometasone (ASMANEX) 220 MCG/INH inhaler Inhale 2 puffs into the lungs 2 (two) times daily as needed (wheezing  from bronchitis).     . Multiple Vitamins-Minerals (MULTIVITAMIN WITH MINERALS) tablet Take 1 tablet by mouth daily.      Marland Kitchen ofloxacin (OCUFLOX) 0.3 % ophthalmic solution Place 1 drop into both eyes 4 (four) times daily. Stop date 09/16/15  1  . olopatadine (PATANOL) 0.1 % ophthalmic solution Place 1 drop into both eyes daily as needed (seasonal allergies). 5 mL 11  . omeprazole (PRILOSEC OTC) 20 MG tablet Take 20 mg by mouth daily.    . Potassium Citrate 15 MEQ (1620 MG) TBCR Take 1 tablet by mouth 2 (two) times daily.     . prednisoLONE acetate (PRED FORTE) 1 % ophthalmic suspension Place 1 drop into both eyes See admin instructions. Starting 08/27/15:  instill 1 drop into LEFT eye 4 times a day for one week, then 3 times a day for one week, then 2 times a day for one week, then 1 time a day for one week, then stop. Starting 09/10/15: instill 1 drop into RIGHT eye 4 times a day for one week, then 3 times a day for one week, then 2 times a day for one week, then 1 time a day for one week, then stop.  1  . silodosin (RAPAFLO) 4 MG CAPS capsule Take 4 mg by mouth at bedtime.     Marland Kitchen zolpidem (AMBIEN) 10 MG tablet Take 1 tablet (10 mg total) by mouth at bedtime as needed. for sleep 90 tablet 0   No current facility-administered medications for this encounter.    Allergies  Allergen Reactions  . Codeine Itching  . Oxycodone Itching    Social History   Social History  . Marital Status: Married    Spouse Name: N/A  . Number of Children: N/A  . Years of Education: N/A   Occupational History  . Not on file.   Social History Main Topics  . Smoking status: Never Smoker   . Smokeless tobacco: Never Used  . Alcohol Use: Yes     Comment: 01/21/2015 "maybe 4-5 drinks/yr"  . Drug Use: No  . Sexual Activity: Yes   Other Topics Concern  . Not on file   Social History Narrative   Pt lives in [place] with spouse.  70 yo son is health.  Second son died in car accident.      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 Heart attack Mother 68    MI  . Heart attack Father 53    ? MI versus trauma to head  . Healthy Brother     ROS- All systems are reviewed and negative except as per the HPI above  Physical Exam: Filed Vitals:   01/04/16 0842  BP: 116/80  Pulse: 70  Height: 6' (1.829 m)  Weight: 242 lb 12.8 oz (110.133 kg)    GEN- The patient is well appearing, alert and oriented x 3 today.   Head- normocephalic, atraumatic Eyes-  Sclera clear, conjunctiva pink Ears- hearing intact Oropharynx- clear Neck- supple, no JVP Lymph- no cervical lymphadenopathy Lungs- Clear to ausculation bilaterally, normal work of breathing Heart- Regular rate and rhythm, 2/6  murmurs, rubs or gallops, PMI not laterally displaced GI- soft, NT, ND, + BS Extremities- no clubbing, cyanosis, or edema MS- no significant deformity or atrophy Skin- no rash or lesion Psych- euthymic mood, full affect Neuro- strength and sensation are intact  EKG- NSR, rate 70 bpm, pr int 166 ms, qrs int 90 ms, qtc 419 ms. Epic records reviewed. Linq interrogated and arrhythmias seem to correlate with PAC's vr afib  Assessment and Plan: 1. Afib In SR  Linq in place Continue with remote trasmissions  Continue eliquis, chadsvasc of 2 Continue cardizem  2. Aortic stenosis Followed by Dr. Tamala Julian Appointment pending  August  F/u in afib clinic in 9 months, sooner if needed  Butch Penny C. Carroll, [facility] [address] [place], [place] [address] [phone]

## 2016-01-07 ENCOUNTER — Encounter: Payer: Self-pay | Admitting: Family Medicine

## 2016-01-11 DIAGNOSIS — K219 Gastro-esophageal reflux disease without esophagitis: Secondary | ICD-10-CM | POA: Diagnosis not present

## 2016-01-13 ENCOUNTER — Ambulatory Visit (INDEPENDENT_AMBULATORY_CARE_PROVIDER_SITE_OTHER): Payer: PPO | Admitting: *Deleted

## 2016-01-13 DIAGNOSIS — I480 Paroxysmal atrial fibrillation: Secondary | ICD-10-CM

## 2016-01-13 NOTE — Progress Notes (Signed)
Carelink Summary Report / Loop Recorder 

## 2016-01-18 ENCOUNTER — Telehealth: Payer: Self-pay | Admitting: Pharmacist

## 2016-01-18 DIAGNOSIS — M5412 Radiculopathy, cervical region: Secondary | ICD-10-CM | POA: Diagnosis not present

## 2016-01-18 DIAGNOSIS — M542 Cervicalgia: Secondary | ICD-10-CM | POA: Diagnosis not present

## 2016-01-18 NOTE — Telephone Encounter (Signed)
Pt having upcoming cervical epidural steroid injection with Dr. Laroy Apple at [facility] Pt on Eliquis for afib, no history of stroke. Ok to hold Eliquis x3 days prior to spinal injection. Clearance faxed to #[phone] on 3/6.

## 2016-01-29 DIAGNOSIS — M5412 Radiculopathy, cervical region: Secondary | ICD-10-CM | POA: Diagnosis not present

## 2016-01-29 DIAGNOSIS — M542 Cervicalgia: Secondary | ICD-10-CM | POA: Diagnosis not present

## 2016-02-04 ENCOUNTER — Encounter: Payer: Self-pay | Admitting: Family Medicine

## 2016-02-12 ENCOUNTER — Ambulatory Visit (INDEPENDENT_AMBULATORY_CARE_PROVIDER_SITE_OTHER): Payer: PPO | Admitting: *Deleted

## 2016-02-12 DIAGNOSIS — I480 Paroxysmal atrial fibrillation: Secondary | ICD-10-CM

## 2016-02-12 NOTE — Progress Notes (Signed)
Carelink Summary Report / Loop Recorder 

## 2016-02-16 DIAGNOSIS — K219 Gastro-esophageal reflux disease without esophagitis: Secondary | ICD-10-CM | POA: Diagnosis not present

## 2016-02-18 ENCOUNTER — Telehealth: Payer: Self-pay | Admitting: Interventional Cardiology

## 2016-02-18 NOTE — Telephone Encounter (Signed)
Okay to proceed. Needs to be off Eliquis at least 4 doses(48 hours) before procedure.

## 2016-02-18 NOTE — Telephone Encounter (Signed)
New message      Request for surgical clearance:  What type of surgery is being performed?  Endoscopy with dilation When is this surgery scheduled?  02-23-16 1. Are there any medications that need to be held prior to surgery and how long? When should pt stop eliquis?  Name of physician performing surgery?  Dr Earlean Shawl 2. What is your office phone and fax number?  Fax [phone]

## 2016-02-18 NOTE — Telephone Encounter (Signed)
Cardiac clearance rqst fwd to Dr.Smith to adv

## 2016-02-19 NOTE — Telephone Encounter (Signed)
Follow Up:    They need clearance today,procedure is Tuesday please.Please fax [phone] Att: Amy

## 2016-02-19 NOTE — Telephone Encounter (Signed)
Spoke with Amy at [facility] and informed her that I will fax this telephone note to her in regards to the pt needing to hold eliquis prior to procedure, per Dr Tamala Julian.  Will fax this clearance too [phone] attention Amy at [facility].  Amy verbalized understanding and agrees with this plan.

## 2016-02-23 DIAGNOSIS — T182XXD Foreign body in stomach, subsequent encounter: Secondary | ICD-10-CM | POA: Diagnosis not present

## 2016-02-23 DIAGNOSIS — R1310 Dysphagia, unspecified: Secondary | ICD-10-CM | POA: Diagnosis not present

## 2016-02-23 DIAGNOSIS — K449 Diaphragmatic hernia without obstruction or gangrene: Secondary | ICD-10-CM | POA: Diagnosis not present

## 2016-02-23 DIAGNOSIS — K210 Gastro-esophageal reflux disease with esophagitis: Secondary | ICD-10-CM | POA: Diagnosis not present

## 2016-02-25 ENCOUNTER — Other Ambulatory Visit: Payer: Self-pay | Admitting: Gastroenterology

## 2016-02-25 DIAGNOSIS — K3184 Gastroparesis: Secondary | ICD-10-CM

## 2016-03-03 ENCOUNTER — Telehealth: Payer: Self-pay | Admitting: Pharmacist

## 2016-03-03 ENCOUNTER — Other Ambulatory Visit: Payer: Self-pay | Admitting: Family Medicine

## 2016-03-03 NOTE — Telephone Encounter (Signed)
Pt having upcoming cervical epidural steroid injection with Dr. Laroy Apple at [facility]. Pt on Eliquis for afib, no history of stroke. Ok to hold Eliquis x3 days prior to spinal injection. Clearance faxed to #[phone] on 4/20.

## 2016-03-03 NOTE — Telephone Encounter (Signed)
Is this okay to refill? 

## 2016-03-12 ENCOUNTER — Other Ambulatory Visit: Payer: Self-pay | Admitting: Family Medicine

## 2016-03-14 ENCOUNTER — Ambulatory Visit (INDEPENDENT_AMBULATORY_CARE_PROVIDER_SITE_OTHER): Payer: PPO | Admitting: *Deleted

## 2016-03-14 DIAGNOSIS — I480 Paroxysmal atrial fibrillation: Secondary | ICD-10-CM | POA: Diagnosis not present

## 2016-03-14 LAB — CUP PACEART REMOTE DEVICE CHECK: Date Time Interrogation Session: 20170430123543

## 2016-03-14 NOTE — Telephone Encounter (Signed)
Have him set up an appointment to discuss the use of Ambien

## 2016-03-14 NOTE — Telephone Encounter (Signed)
Is this okay to call in? 

## 2016-03-14 NOTE — Progress Notes (Signed)
Carelink Summary Report / Loop Recorder 

## 2016-03-15 ENCOUNTER — Telehealth: Payer: Self-pay | Admitting: *Deleted

## 2016-03-15 ENCOUNTER — Encounter (HOSPITAL_COMMUNITY)
Admission: RE | Admit: 2016-03-15 | Discharge: 2016-03-15 | Disposition: A | Discharge status: Home / Self Care | Payer: PPO | Source: Ambulatory Visit | Attending: Gastroenterology | Admitting: Gastroenterology

## 2016-03-15 DIAGNOSIS — K3184 Gastroparesis: Secondary | ICD-10-CM | POA: Diagnosis not present

## 2016-03-15 DIAGNOSIS — R6881 Early satiety: Secondary | ICD-10-CM | POA: Diagnosis not present

## 2016-03-15 MED ORDER — TECHNETIUM TC 99M SULFUR COLLOID
2.0000 | Freq: Once | INTRAVENOUS | Status: AC | PRN
  Administered 2016-03-15: 2 via ORAL

## 2016-03-15 NOTE — Telephone Encounter (Signed)
Patient called and ask for you to please call him personally today. He knows he needs an appt but would like for you to call him at home [phone]. Thanks.

## 2016-03-17 DIAGNOSIS — K3184 Gastroparesis: Secondary | ICD-10-CM | POA: Diagnosis not present

## 2016-03-17 NOTE — Telephone Encounter (Signed)
New message     The Gi Dr. Earlean Shawl needs to speak with Dr. Tamala Julian regarding the pt, the office staff that called was uncertain about the meeting between patient and Dr. Earlean Shawl.

## 2016-03-17 NOTE — Telephone Encounter (Signed)
I spoke with Joseph Art, she is aware Dr. Tamala Julian is back in the office on Monday, but I will forward this message to Dr. Tamala Julian. I asked if there was anything specific we needed to relay to Dr. Tamala Julian, and per [facility], Dr. Earlean Shawl stated he just needed to talk to Dr. Tamala Julian about the patient.

## 2016-03-18 ENCOUNTER — Institutional Professional Consult (permissible substitution): Payer: Self-pay | Admitting: Family Medicine

## 2016-03-21 ENCOUNTER — Encounter: Payer: Self-pay | Admitting: Family Medicine

## 2016-03-21 ENCOUNTER — Telehealth: Payer: Self-pay | Admitting: Interventional Cardiology

## 2016-03-21 ENCOUNTER — Ambulatory Visit (INDEPENDENT_AMBULATORY_CARE_PROVIDER_SITE_OTHER): Payer: PPO | Admitting: Family Medicine

## 2016-03-21 ENCOUNTER — Other Ambulatory Visit: Payer: Self-pay

## 2016-03-21 VITALS — BP 114/70 | HR 72 | Wt 242.0 lb

## 2016-03-21 DIAGNOSIS — G4733 Obstructive sleep apnea (adult) (pediatric): Secondary | ICD-10-CM

## 2016-03-21 DIAGNOSIS — Z1159 Encounter for screening for other viral diseases: Secondary | ICD-10-CM

## 2016-03-21 DIAGNOSIS — K219 Gastro-esophageal reflux disease without esophagitis: Secondary | ICD-10-CM

## 2016-03-21 DIAGNOSIS — G4700 Insomnia, unspecified: Secondary | ICD-10-CM | POA: Diagnosis not present

## 2016-03-21 DIAGNOSIS — K3184 Gastroparesis: Secondary | ICD-10-CM | POA: Diagnosis not present

## 2016-03-21 MED ORDER — ZOLPIDEM TARTRATE 5 MG PO TABS: 5.0000 mg | ORAL_TABLET | Freq: Every evening | ORAL | Status: DC | PRN

## 2016-03-21 NOTE — Telephone Encounter (Signed)
Returned call to [employer] @ Dr.Medoff's office. Dr.Medoff is not in the office today. Dr.Smith will call him directly on his cell

## 2016-03-21 NOTE — Patient Instructions (Signed)
Insomnia Insomnia is a sleep disorder that makes it difficult to fall asleep or to stay asleep. Insomnia can cause tiredness (fatigue), low energy, difficulty concentrating, mood swings, and poor performance at work or school.  There are three different ways to classify insomnia:  Difficulty falling asleep.  Difficulty staying asleep.  Waking up too early in the morning. Any type of insomnia can be long-term (chronic) or short-term (acute). Both are common. Short-term insomnia usually lasts for three months or less. Chronic insomnia occurs at least three times a week for longer than three months. CAUSES  Insomnia may be caused by another condition, situation, or substance, such as:  Anxiety.  Certain medicines.  Gastroesophageal reflux disease (GERD) or other gastrointestinal conditions.  Asthma or other breathing conditions.  Restless legs syndrome, sleep apnea, or other sleep disorders.  Chronic pain.  Menopause. This may include hot flashes.  Stroke.  Abuse of alcohol, tobacco, or illegal drugs.  Depression.  Caffeine.   Neurological disorders, such as Alzheimer disease.  An overactive thyroid (hyperthyroidism). The cause of insomnia may not be known. RISK FACTORS Risk factors for insomnia include:  Gender. Women are more commonly affected than men.  Age. Insomnia is more common as you get older.  Stress. This may involve your professional or personal life.  Income. Insomnia is more common in people with lower income.  Lack of exercise.   Irregular work schedule or night shifts.  Traveling between different time zones. SIGNS AND SYMPTOMS If you have insomnia, trouble falling asleep or trouble staying asleep is the main symptom. This may lead to other symptoms, such as:  Feeling fatigued.  Feeling nervous about going to sleep.  Not feeling rested in the morning.  Having trouble concentrating.  Feeling irritable, anxious, or depressed. TREATMENT   Treatment for insomnia depends on the cause. If your insomnia is caused by an underlying condition, treatment will focus on addressing the condition. Treatment may also include:   Medicines to help you sleep.  Counseling or therapy.  Lifestyle adjustments. HOME CARE INSTRUCTIONS   Take medicines only as directed by your health care provider.  Keep regular sleeping and waking hours. Avoid naps.  Keep a sleep diary to help you and your health care provider figure out what could be causing your insomnia. Include:   When you sleep.  When you wake up during the night.  How well you sleep.   How rested you feel the next day.  Any side effects of medicines you are taking.  What you eat and drink.   Make your bedroom a comfortable place where it is easy to fall asleep:  Put up shades or special blackout curtains to block light from outside.  Use a white noise machine to block noise.  Keep the temperature cool.   Exercise regularly as directed by your health care provider. Avoid exercising right before bedtime.  Use relaxation techniques to manage stress. Ask your health care provider to suggest some techniques that may work well for you. These may include:  Breathing exercises.  Routines to release muscle tension.  Visualizing peaceful scenes.  Cut back on alcohol, caffeinated beverages, and cigarettes, especially close to bedtime. These can disrupt your sleep.  Do not overeat or eat spicy foods right before bedtime. This can lead to digestive discomfort that can make it hard for you to sleep.  Limit screen use before bedtime. This includes:  Watching TV.  Using your smartphone, tablet, and computer.  Stick to a routine. This   can help you fall asleep faster. Try to do a quiet activity, brush your teeth, and go to bed at the same time each night.  Get out of bed if you are still awake after 15 minutes of trying to sleep. Keep the lights down, but try reading or  doing a quiet activity. When you feel sleepy, go back to bed.  Make sure that you drive carefully. Avoid driving if you feel very sleepy.  Keep all follow-up appointments as directed by your health care provider. This is important. SEEK MEDICAL CARE IF:   You are tired throughout the day or have trouble in your daily routine due to sleepiness.  You continue to have sleep problems or your sleep problems get worse. SEEK IMMEDIATE MEDICAL CARE IF:   You have serious thoughts about hurting yourself or someone else.   This information is not intended to replace advice given to you by your health care provider. Make sure you discuss any questions you have with your health care provider.   Document Released: 10/28/2000 Document Revised: 07/22/2015 Document Reviewed: 08/01/2014 Elsevier Interactive Patient Education 2016 Elsevier Inc.  

## 2016-03-21 NOTE — Telephone Encounter (Signed)
Returned call to [facility] @ Dr.Medoff

## 2016-03-21 NOTE — Telephone Encounter (Signed)
Called in ambien per jcl 

## 2016-03-21 NOTE — Progress Notes (Signed)
   Subjective:    Patient ID: Jeremy Tucker, male    DOB: 1950-12-13, 65 y.o.   MRN: [id number]  HPI  he is here for a consult concerning sleep issues. He has been on Ambien for quite some time , initially placed on it when he was having stress-related sleep issues. At this point some of these issues have been resolved and that he is now retired. He also has a history of OSA and did try nasal CPAP but did not tolerate it. He does have reflux disease as well as gastroparesis and is in the process of having this further evaluated and treated. He admits to becoming habituation to the Ambien.   Review of Systems     Objective:   Physical Exam  alert and in no distress otherwise not examined       Assessment & Plan:  Gastroparesis  Insomnia - Plan: zolpidem (AMBIEN) 5 MG tablet  Need for hepatitis C screening test - Plan: Hepatitis C antibody  Gastroesophageal reflux disease without esophagitis  OSA (obstructive sleep apnea)  I discussed treatment of his sleep disturbance in regard to risks versus benefits. He does have some risks in regard to reflux disease as well as gastroparesis. He also has a history of OSA that he is not interested in treating more aggressively. Sleep hygiene information given. I discussed the fact that I Jeremy Tucker with giving him Ambien he'll need to monitor this and as his reflux/ gastroparesis gets better treated, that would be even less reason to be treating his sleep disturbance.  I explained the long-term consequences of using sleep medications. Over 30 minutes, greater than 50% spent in counseling and coordination of care.

## 2016-03-21 NOTE — Telephone Encounter (Signed)
New message     The cardiologist needs to speak with the doctor about the Endo. Per Joseph Art at [facility] Dr. Earlean Shawl needs to speak with Dr. Tamala Julian about the pt.

## 2016-03-22 ENCOUNTER — Other Ambulatory Visit: Payer: Self-pay | Admitting: Family Medicine

## 2016-03-22 LAB — HEPATITIS C ANTIBODY: HCV Ab: NEGATIVE

## 2016-03-22 NOTE — Telephone Encounter (Signed)
Spoke to Dr. Earlean Shawl. Spoke to the patient. No further cardiac workup is needed prior to GI testing and further procedures.

## 2016-03-22 NOTE — Telephone Encounter (Signed)
Okay to renew

## 2016-03-22 NOTE — Telephone Encounter (Signed)
Dr.Lalonde is this okay to refill 

## 2016-03-22 NOTE — Telephone Encounter (Signed)
Called in med 

## 2016-03-24 DIAGNOSIS — M5412 Radiculopathy, cervical region: Secondary | ICD-10-CM | POA: Diagnosis not present

## 2016-03-24 DIAGNOSIS — M542 Cervicalgia: Secondary | ICD-10-CM | POA: Diagnosis not present

## 2016-04-05 LAB — CUP PACEART REMOTE DEVICE CHECK: Date Time Interrogation Session: 20170301120754

## 2016-04-07 ENCOUNTER — Other Ambulatory Visit: Payer: Self-pay | Admitting: Neurosurgery

## 2016-04-07 DIAGNOSIS — M542 Cervicalgia: Secondary | ICD-10-CM | POA: Diagnosis not present

## 2016-04-07 DIAGNOSIS — M4722 Other spondylosis with radiculopathy, cervical region: Secondary | ICD-10-CM | POA: Diagnosis not present

## 2016-04-07 DIAGNOSIS — M5412 Radiculopathy, cervical region: Secondary | ICD-10-CM | POA: Diagnosis not present

## 2016-04-07 DIAGNOSIS — M5030 Other cervical disc degeneration, unspecified cervical region: Secondary | ICD-10-CM | POA: Diagnosis not present

## 2016-04-07 DIAGNOSIS — M5020 Other cervical disc displacement, unspecified cervical region: Secondary | ICD-10-CM | POA: Diagnosis not present

## 2016-04-09 LAB — CUP PACEART REMOTE DEVICE CHECK: Date Time Interrogation Session: 20170331120833

## 2016-04-09 NOTE — Progress Notes (Signed)
Carelink summary report received. Battery status OK. Normal device function. No new symptom episodes, tachy episodes, brady, or pause episodes. 40.9%AF, V rates controlled, +Eliquis. Monthly summary reports and ROV/PRN

## 2016-04-12 ENCOUNTER — Ambulatory Visit (INDEPENDENT_AMBULATORY_CARE_PROVIDER_SITE_OTHER): Payer: PPO | Admitting: *Deleted

## 2016-04-12 ENCOUNTER — Other Ambulatory Visit: Payer: Self-pay | Admitting: Family Medicine

## 2016-04-12 DIAGNOSIS — I480 Paroxysmal atrial fibrillation: Secondary | ICD-10-CM | POA: Diagnosis not present

## 2016-04-13 ENCOUNTER — Other Ambulatory Visit: Payer: Self-pay

## 2016-04-13 NOTE — Telephone Encounter (Signed)
Called med in 

## 2016-04-13 NOTE — Progress Notes (Signed)
Carelink Summary Report / Loop Recorder 

## 2016-04-13 NOTE — Telephone Encounter (Signed)
Is this okay to refill? 

## 2016-04-15 ENCOUNTER — Other Ambulatory Visit: Payer: Self-pay

## 2016-04-23 NOTE — Progress Notes (Signed)
Carelink summary report received. Battery status OK. Normal device function. No new symptom episodes, tachy episodes, brady, or pause episodes. 158 AF- 39.6% +Eliquis. Monthly summary reports and ROV/PRN

## 2016-05-12 ENCOUNTER — Ambulatory Visit (INDEPENDENT_AMBULATORY_CARE_PROVIDER_SITE_OTHER): Payer: PPO | Admitting: *Deleted

## 2016-05-12 ENCOUNTER — Telehealth: Payer: Self-pay | Admitting: Internal Medicine

## 2016-05-12 DIAGNOSIS — I480 Paroxysmal atrial fibrillation: Secondary | ICD-10-CM

## 2016-05-12 NOTE — Progress Notes (Signed)
Carelink Summary Report / Loop Recorder 

## 2016-05-12 NOTE — Telephone Encounter (Signed)
Returned call to patient.  He says the form was sent to Dr Tamala Julian to fill out.  I let him know I would forward to Dr Tamala Julian for review

## 2016-05-12 NOTE — Telephone Encounter (Signed)
New message      The pt is calling regarding form from Dr Ovidio Kin the form was faxed over and needs to filled out by the cardiologist and send back the pt states he is having surgery on July 19 th to the office of Dr. Rita Ohara

## 2016-05-13 NOTE — Telephone Encounter (Signed)
Cleared for the upcoming procedure. No testing needed. DC Eliquis 48 hours prior to the procedure and resume when safe per Dr. Sherwood Gambler.

## 2016-05-13 NOTE — Telephone Encounter (Signed)
Patient is scheduled to undergo a C/4-5 Anterior Cervical Fusion under general anesthesia on 06/01/16 with DR.Jovita Gamma. Clearance form received from [facility] for authorization received to transfer preoperative management of Eliquis for the patients planned surgery  Form should be faxed to [phone]  Clearance request forwarded to Dr.Smith to advise

## 2016-05-13 NOTE — Telephone Encounter (Signed)
Clearance placed in MR nurse fax box to be faxed to Dr.Nudelman's office

## 2016-05-23 LAB — CUP PACEART REMOTE DEVICE CHECK
Date Time Interrogation Session: 20170530130629
Date Time Interrogation Session: 20170629133720

## 2016-05-24 ENCOUNTER — Other Ambulatory Visit (HOSPITAL_COMMUNITY): Payer: PPO

## 2016-06-01 ENCOUNTER — Encounter (HOSPITAL_COMMUNITY): Admission: RE | Payer: Self-pay | Source: Ambulatory Visit

## 2016-06-01 ENCOUNTER — Inpatient Hospital Stay (HOSPITAL_COMMUNITY): Admission: RE | Admit: 2016-06-01 | Payer: PPO | Source: Ambulatory Visit | Admitting: Neurosurgery

## 2016-06-01 SURGERY — ANTERIOR CERVICAL DECOMPRESSION/DISCECTOMY FUSION 1 LEVEL
Anesthesia: General

## 2016-06-02 ENCOUNTER — Telehealth: Payer: Self-pay | Admitting: Family Medicine

## 2016-06-02 NOTE — Telephone Encounter (Signed)
Pt called & states he has a cold and cough and specifically asked if you would call him and let him know what he can take with all the meds he is on.

## 2016-06-02 NOTE — Telephone Encounter (Signed)
Please call him on his cell # [phone]

## 2016-06-05 ENCOUNTER — Other Ambulatory Visit (HOSPITAL_COMMUNITY): Payer: Self-pay | Admitting: Nurse Practitioner

## 2016-06-07 ENCOUNTER — Encounter: Payer: Self-pay | Admitting: Family Medicine

## 2016-06-07 ENCOUNTER — Ambulatory Visit (INDEPENDENT_AMBULATORY_CARE_PROVIDER_SITE_OTHER): Payer: PPO | Admitting: Family Medicine

## 2016-06-07 VITALS — BP 120/60 | HR 86 | Temp 98.5°F | Wt 234.0 lb

## 2016-06-07 DIAGNOSIS — J209 Acute bronchitis, unspecified: Secondary | ICD-10-CM | POA: Diagnosis not present

## 2016-06-07 MED ORDER — AMOXICILLIN-POT CLAVULANATE 875-125 MG PO TABS: 1.0000 | ORAL_TABLET | Freq: Two times a day (BID) | ORAL | 0 refills | Status: DC

## 2016-06-07 NOTE — Patient Instructions (Signed)
Take all the medication and if not better call

## 2016-06-07 NOTE — Progress Notes (Signed)
   Subjective:    Patient ID: Jeremy Tucker, male    DOB: 03/18/1951, 65 y.o.   MRN: [id number]  HPI He complains of a five-day history it started with chest congestion and slight cough with headache, nasal congestion, PND. He now has developed a slightly hoarse voice as well as some shortness of breath with coughing and fatigue.. No sore throat, earache, upper tooth discomfort, fever or chills. Does not smoke.  Review of Systems     Objective:   Physical Exam Alert and in no distress. Tympanic membranes and canals are normal. Pharyngeal area is normal. Neck is supple without adenopathy or thyromegaly. Cardiac exam shows a regular sinus rhythm without murmurs or gallops. Lungs are clear to auscultation. He is a mucosa is slightly reddish with nontender sinuses       Assessment & Plan:  Acute bronchitis, unspecified organism - Plan: amoxicillin-clavulanate (AUGMENTIN) 875-125 MG tablet He is to call me if he does not get completely back to normal.

## 2016-06-08 NOTE — Telephone Encounter (Signed)
New message    Pt called requesting  an earlier appt than already scheduled (08/18/16) due to stomach issues. Would not see an App. Please call.

## 2016-06-11 ENCOUNTER — Other Ambulatory Visit: Payer: Self-pay | Admitting: Family Medicine

## 2016-06-11 ENCOUNTER — Other Ambulatory Visit: Payer: Self-pay | Admitting: Gastroenterology

## 2016-06-11 DIAGNOSIS — R634 Abnormal weight loss: Secondary | ICD-10-CM

## 2016-06-13 ENCOUNTER — Ambulatory Visit (INDEPENDENT_AMBULATORY_CARE_PROVIDER_SITE_OTHER): Payer: PPO | Admitting: *Deleted

## 2016-06-13 DIAGNOSIS — I480 Paroxysmal atrial fibrillation: Secondary | ICD-10-CM | POA: Diagnosis not present

## 2016-06-13 NOTE — Telephone Encounter (Signed)
Is this okay to refill? 

## 2016-06-13 NOTE — Progress Notes (Signed)
Carelink Summary Report / Loop Recorder 

## 2016-06-14 ENCOUNTER — Telehealth: Payer: Self-pay | Admitting: Family Medicine

## 2016-06-14 DIAGNOSIS — J209 Acute bronchitis, unspecified: Secondary | ICD-10-CM

## 2016-06-14 MED ORDER — AMOXICILLIN-POT CLAVULANATE 875-125 MG PO TABS: 1.0000 | ORAL_TABLET | Freq: Two times a day (BID) | ORAL | 0 refills | Status: DC

## 2016-06-14 NOTE — Telephone Encounter (Signed)
Returned call to [place] [phone] he states he wants to talk with you personally.  He is some better with bronchitis, but wants to speak with you.  Please call him

## 2016-06-15 ENCOUNTER — Ambulatory Visit
Admission: RE | Admit: 2016-06-15 | Discharge: 2016-06-15 | Disposition: A | Discharge status: Home / Self Care | Payer: PPO | Source: Ambulatory Visit | Attending: Gastroenterology | Admitting: Gastroenterology

## 2016-06-15 DIAGNOSIS — N200 Calculus of kidney: Secondary | ICD-10-CM | POA: Diagnosis not present

## 2016-06-15 DIAGNOSIS — R634 Abnormal weight loss: Secondary | ICD-10-CM

## 2016-06-15 MED ORDER — IOPAMIDOL (ISOVUE-300) INJECTION 61%
100.0000 mL | Freq: Once | INTRAVENOUS | Status: AC | PRN
  Administered 2016-06-15: 100 mL via INTRAVENOUS

## 2016-06-20 LAB — CUP PACEART REMOTE DEVICE CHECK: Date Time Interrogation Session: 20170731125839

## 2016-06-21 DIAGNOSIS — K3184 Gastroparesis: Secondary | ICD-10-CM | POA: Diagnosis not present

## 2016-07-11 ENCOUNTER — Ambulatory Visit (INDEPENDENT_AMBULATORY_CARE_PROVIDER_SITE_OTHER): Payer: PPO | Admitting: *Deleted

## 2016-07-11 DIAGNOSIS — I480 Paroxysmal atrial fibrillation: Secondary | ICD-10-CM | POA: Diagnosis not present

## 2016-07-12 NOTE — Progress Notes (Signed)
Carelink Summary Report / Loop Recorder 

## 2016-07-16 ENCOUNTER — Other Ambulatory Visit: Payer: Self-pay | Admitting: Family Medicine

## 2016-07-19 NOTE — Telephone Encounter (Signed)
Is this okay to refill last lipid 2015

## 2016-07-19 NOTE — Telephone Encounter (Signed)
It looks like his last lipid panel was November 2015. If that's the case and he needs to come in.

## 2016-07-25 ENCOUNTER — Other Ambulatory Visit: Payer: Self-pay | Admitting: Family Medicine

## 2016-07-25 NOTE — Telephone Encounter (Signed)
Is this okay to refill? 

## 2016-08-06 LAB — CUP PACEART REMOTE DEVICE CHECK: Date Time Interrogation Session: 20170828140736

## 2016-08-06 NOTE — Progress Notes (Signed)
Carelink summary report received. Battery status OK. Normal device function. No new symptom episodes, tachy episodes, brady, or pause episodes. 33.9% AF, +Eliquis.  Monthly summary reports and ROV/PRN

## 2016-08-10 ENCOUNTER — Ambulatory Visit (INDEPENDENT_AMBULATORY_CARE_PROVIDER_SITE_OTHER): Payer: PPO | Admitting: *Deleted

## 2016-08-10 DIAGNOSIS — I480 Paroxysmal atrial fibrillation: Secondary | ICD-10-CM | POA: Diagnosis not present

## 2016-08-10 NOTE — Progress Notes (Signed)
Carelink Summary Report / Loop Recorder 

## 2016-08-12 ENCOUNTER — Telehealth: Payer: Self-pay | Admitting: Interventional Cardiology

## 2016-08-12 NOTE — Telephone Encounter (Signed)
Returned pt call. Pt appt rescheduled with Dr.Smith  for 10/16 @ 12pm. Pt voices appreciation for the assistance.

## 2016-08-12 NOTE — Telephone Encounter (Signed)
Pt cxl appt for 10-5, leaving to go out of town 08-17-16, his dtr n law is having a baby and he will be going to [place] for a week, really needs to see Tamala Julian but I couldn't find anything and I went into December. Would like a nurse call to see if he can get in any sooner

## 2016-08-16 ENCOUNTER — Other Ambulatory Visit: Payer: Self-pay | Admitting: Family Medicine

## 2016-08-16 ENCOUNTER — Encounter: Payer: Self-pay | Admitting: Family Medicine

## 2016-08-16 ENCOUNTER — Ambulatory Visit (INDEPENDENT_AMBULATORY_CARE_PROVIDER_SITE_OTHER): Payer: PPO | Admitting: Family Medicine

## 2016-08-16 VITALS — BP 124/70 | HR 70 | Wt 248.0 lb

## 2016-08-16 DIAGNOSIS — M109 Gout, unspecified: Secondary | ICD-10-CM

## 2016-08-16 DIAGNOSIS — I10 Essential (primary) hypertension: Secondary | ICD-10-CM

## 2016-08-16 DIAGNOSIS — Z125 Encounter for screening for malignant neoplasm of prostate: Secondary | ICD-10-CM | POA: Diagnosis not present

## 2016-08-16 DIAGNOSIS — I480 Paroxysmal atrial fibrillation: Secondary | ICD-10-CM | POA: Diagnosis not present

## 2016-08-16 DIAGNOSIS — G4733 Obstructive sleep apnea (adult) (pediatric): Secondary | ICD-10-CM

## 2016-08-16 DIAGNOSIS — N200 Calculus of kidney: Secondary | ICD-10-CM

## 2016-08-16 DIAGNOSIS — R5383 Other fatigue: Secondary | ICD-10-CM

## 2016-08-16 DIAGNOSIS — R7302 Impaired glucose tolerance (oral): Secondary | ICD-10-CM | POA: Diagnosis not present

## 2016-08-16 DIAGNOSIS — E669 Obesity, unspecified: Secondary | ICD-10-CM

## 2016-08-16 DIAGNOSIS — I350 Nonrheumatic aortic (valve) stenosis: Secondary | ICD-10-CM

## 2016-08-16 DIAGNOSIS — E119 Type 2 diabetes mellitus without complications: Secondary | ICD-10-CM

## 2016-08-16 DIAGNOSIS — N400 Enlarged prostate without lower urinary tract symptoms: Secondary | ICD-10-CM | POA: Diagnosis not present

## 2016-08-16 DIAGNOSIS — K219 Gastro-esophageal reflux disease without esophagitis: Secondary | ICD-10-CM

## 2016-08-16 DIAGNOSIS — R7309 Other abnormal glucose: Secondary | ICD-10-CM | POA: Diagnosis not present

## 2016-08-16 DIAGNOSIS — M1990 Unspecified osteoarthritis, unspecified site: Secondary | ICD-10-CM | POA: Diagnosis not present

## 2016-08-16 DIAGNOSIS — E785 Hyperlipidemia, unspecified: Secondary | ICD-10-CM

## 2016-08-16 DIAGNOSIS — Z23 Encounter for immunization: Secondary | ICD-10-CM

## 2016-08-16 LAB — TSH: TSH: 1.4 mIU/L (ref 0.40–4.50)

## 2016-08-16 LAB — TESTOSTERONE: Testosterone: 428 ng/dL (ref 250–827)

## 2016-08-16 NOTE — Progress Notes (Signed)
   Subjective:    Patient ID: Jeremy Tucker, male    DOB: 08/29/1951, 65 y.o.   MRN: [id number]  HPI He is here for an interval evaluation. He does have an underlying history of PAF as well as aortic stenosis, hypertension and a history of glucose intolerance. He simply he has had some difficulty with left arm pain and occasional diaphoresis with this but no chest pain, shortness of breath, DOE. The pain seems to be mainly occurring at night. He also has a history of OSA but presently is not on CPAP stating he is claustrophobic and could not stand using the appliance on his face. He does have underlying reflux disease and is followed by Dr. Earlean Shawl. He does complain of arthritis however he does remain physically active. He was told he would eventually need a knee replacement but he is holding off on that as he is still quite physically active and has not interfered. His weight is relatively stable. He has had difficulty in the past with renal stones and is followed by Dr. Gaynelle Arabian for this as well as his BPH. He does have underlying gout and is presently on allopurinol. He has no doubt related symptoms. He does however complain of generalized fatigue especially after doing yard work. It does not interfere with his ability to walk. Presently he has been off Ambien for quite some time. He is in the process of tapering off Wellbutrin as well as BuSpar. He rarely uses his lorazepam. He is now retired and apparently also on disability.   Review of Systems  All other systems reviewed and are negative.      Objective:   Physical Exam Alert and in no distress. Tympanic membranes and canals are normal. Pharyngeal area is normal. Neck is supple without adenopathy or thyromegaly. Cardiac exam shows a regular sinus rhythm without murmurs or gallops. Lungs are clear to auscultation.        Assessment & Plan:  Paroxysmal atrial fibrillation ([place]) - Plan: CBC with Differential/Platelet, Comprehensive  metabolic panel, Lipid panel  Essential hypertension - Plan: CBC with Differential/Platelet, Comprehensive metabolic panel  OSA (obstructive sleep apnea)  Gastroesophageal reflux disease without esophagitis  Glucose intolerance (impaired glucose tolerance)  Arthritis  Hyperlipidemia LDL goal <70 - Plan: Lipid panel  Obesity (BMI 30-39.9) - Plan: CBC with Differential/Platelet, Comprehensive metabolic panel, Lipid panel  Renal stones  Benign prostatic hyperplasia without lower urinary tract symptoms  Screening for prostate cancer - Plan: PSA  Aortic stenosis, moderate  Gout, unspecified cause, unspecified chronicity, unspecified site - Plan: Uric Acid  Other fatigue - Plan: CBC with Differential/Platelet, Comprehensive metabolic panel, Lipid panel, Testosterone, TSH Discuss his OSA with him in regard to getting on a different appliance and he is not interested in doing that. He will continue on his present medications and follow-up with Dr. Gaynelle Arabian as well as Dr. Earlean Shawl. He also has an appointment scheduled with his cardiologist. I do not get the strong feeling that his chest pain is cardiac in origin but we need to rule out Discussed treatment of his arthritis and he will continue with his present regimen. He apparently does have evidence of renal stones in his kidneys but presently is having no trouble. He will continue on his other medications. Over 45 minutes, greater than 50% spent in counseling and coordination of care.

## 2016-08-17 LAB — LIPID PANEL
Cholesterol: 163 mg/dL (ref 125–200)
HDL: 45 mg/dL (ref >=40)
LDL Cholesterol: 90 mg/dL (ref <130)
Total CHOL/HDL Ratio: 3.6 Ratio (ref <=5.0)
Triglycerides: 140 mg/dL (ref <150)
VLDL: 28 mg/dL (ref <30)

## 2016-08-17 LAB — COMPREHENSIVE METABOLIC PANEL
ALT: 48 U/L — ABNORMAL HIGH (ref 9–46)
AST: 37 U/L — ABNORMAL HIGH (ref 10–35)
Albumin: 4.3 g/dL (ref 3.6–5.1)
Alkaline Phosphatase: 77 U/L (ref 40–115)
BUN: 12 mg/dL (ref 7–25)
CO2: 25 mmol/L (ref 20–31)
Calcium: 9.2 mg/dL (ref 8.6–10.3)
Chloride: 103 mmol/L (ref 98–110)
Creat: 0.75 mg/dL (ref 0.70–1.25)
Glucose, Bld: 143 mg/dL — ABNORMAL HIGH (ref 65–99)
Potassium: 4.3 mmol/L (ref 3.5–5.3)
Sodium: 140 mmol/L (ref 135–146)
Total Bilirubin: 0.5 mg/dL (ref 0.2–1.2)
Total Protein: 6.8 g/dL (ref 6.1–8.1)

## 2016-08-17 LAB — CBC WITH DIFFERENTIAL/PLATELET
Basophils Absolute: 0 cells/uL (ref 0–200)
Basophils Relative: 0 %
Eosinophils Absolute: 213 cells/uL (ref 15–500)
Eosinophils Relative: 3 %
HCT: 42.2 % (ref 38.5–50.0)
Hemoglobin: 14.4 g/dL (ref 13.2–17.1)
Lymphocytes Relative: 19 %
Lymphs Abs: 1349 cells/uL (ref 850–3900)
MCH: 31.9 pg (ref 27.0–33.0)
MCHC: 34.1 g/dL (ref 32.0–36.0)
MCV: 93.4 fL (ref 80.0–100.0)
MPV: 9.6 fL (ref 7.5–12.5)
Monocytes Absolute: 781 cells/uL (ref 200–950)
Monocytes Relative: 11 %
Neutro Abs: 4757 cells/uL (ref 1500–7800)
Neutrophils Relative %: 67 %
Platelets: 217 10*3/uL (ref 140–400)
RBC: 4.52 MIL/uL (ref 4.20–5.80)
RDW: 13.1 % (ref 11.0–15.0)
WBC: 7.1 10*3/uL (ref 4.0–10.5)

## 2016-08-17 LAB — URIC ACID: Uric Acid, Serum: 3.5 mg/dL — ABNORMAL LOW (ref 4.0–8.0)

## 2016-08-17 LAB — PSA: PSA: 3.3 ng/mL (ref <=4.0)

## 2016-08-18 ENCOUNTER — Ambulatory Visit: Payer: PPO | Admitting: Interventional Cardiology

## 2016-08-18 LAB — HEMOGLOBIN A1C
Hgb A1c MFr Bld: 6.5 % — ABNORMAL HIGH (ref <5.7)
Mean Plasma Glucose: 140 mg/dL

## 2016-08-18 NOTE — Progress Notes (Signed)
Recent blood work did show elevated blood sugar and subsequent hemoglobin A1c was 6.5. I discussed this with him. He will make an effort to make diet and exercise changes and come back in 3 months for follow-up visit.

## 2016-08-28 DIAGNOSIS — R0789 Other chest pain: Secondary | ICD-10-CM | POA: Insufficient documentation

## 2016-08-29 ENCOUNTER — Encounter: Payer: Self-pay | Admitting: Interventional Cardiology

## 2016-08-29 ENCOUNTER — Ambulatory Visit (INDEPENDENT_AMBULATORY_CARE_PROVIDER_SITE_OTHER): Payer: PPO | Admitting: Interventional Cardiology

## 2016-08-29 VITALS — BP 110/76 | HR 71 | Ht 72.0 in | Wt 243.0 lb

## 2016-08-29 DIAGNOSIS — I480 Paroxysmal atrial fibrillation: Secondary | ICD-10-CM

## 2016-08-29 DIAGNOSIS — G4733 Obstructive sleep apnea (adult) (pediatric): Secondary | ICD-10-CM

## 2016-08-29 DIAGNOSIS — I350 Nonrheumatic aortic (valve) stenosis: Secondary | ICD-10-CM | POA: Diagnosis not present

## 2016-08-29 DIAGNOSIS — R0789 Other chest pain: Secondary | ICD-10-CM

## 2016-08-29 DIAGNOSIS — I10 Essential (primary) hypertension: Secondary | ICD-10-CM | POA: Diagnosis not present

## 2016-08-29 NOTE — Progress Notes (Addendum)
Cardiology Office Note    Date:  08/29/2016   ID:  Jeremy Tucker, DOB [birth year], MRN [id number]  PCP:  [name], MD  Cardiologist: [name], MD   Chief Complaint  Patient presents with  . Atrial Fibrillation  . Congestive Heart Failure    History of Present Illness:  Jeremy Tucker is a 65 y.o. male presents for paroxysmal atrial fibrillation, status post ablation in April 2016, hypertension, mild aortic stenosis, gastroesophageal reflux, hyperlipidemia, and anxiety disorder. Chronic anticoagulation therapy.  Recurrences of atrial fibrillation have been markedly decreased since the ablation by Dr. [name] earlier this year.  Complains of an occasional axillary discomfort that radiates down the in the aspect of the left arm. The discomfort is intense. It occurs spontaneously. He does a history of cervical disc disease. It is not precipitated by physical activity.  There is been some shortness of breath with activities and generally decreased exertion compared to prior levels of physical activity in the past.   Past Medical History:  Diagnosis Date  . Aortic stenosis, mild   . BPH (benign prostatic hyperplasia)   . Depression   . DJD (degenerative joint disease)   . DVT (deep venous thrombosis) ([place])   . Dysrhythmia    Atrial fibrillation  . GERD (gastroesophageal reflux disease)   . Glucose intolerance (impaired glucose tolerance)   . Gout    denies  . Hyperlipidemia   . Hypertension    denies  . Kidney stones   . Obesity   . Persistent atrial fibrillation ([place])    a. CHADsVASc score at least 2    Past Surgical History:  Procedure Laterality Date  . ATRIAL FIBRILLATION ABLATION N/A 03/12/2015   Procedure: ATRIAL FIBRILLATION ABLATION;  Surgeon: [name], MD;  [facility] CATH LAB;  Service: Cardiovascular;  Laterality: N/A;  . CARDIAC CATHETERIZATION  1990's   "Dr. Melvern Banker", no blockages per pt  . CARDIOVERSION N/A 02/25/2015   Procedure:  CARDIOVERSION;  Surgeon: Sueanne Margarita, MD;  Location: [place];  Service: Cardiovascular;  Laterality: N/A;  . COLONOSCOPY  2010   [facility]  . CYSTOSCOPY W/ STONE MANIPULATION  1980's   "couldn't get stone so they had to cut me open"  . CYSTOSCOPY WITH RETROGRADE PYELOGRAM, URETEROSCOPY AND STENT PLACEMENT Left 04/24/2015   Procedure: URETHRAL MEATAL DILATION, CYSTOSCOPY WITH LEFT RETROGRADE PYELOGRAM, LEFT URETEROSCOPY, STONE BASKET EXTRACTION,  LEFT DOUBLE J STENT PLACEMENT;  Surgeon: Carolan Clines, MD;  Location: [facility] ORS;  Service: Urology;  Laterality: Left;  . EP IMPLANTABLE DEVICE N/A 09/15/2015   Procedure: Loop Recorder Insertion;  Surgeon: [name], MD;  Location: [facility] CV LAB;  Service: Cardiovascular;  Laterality: N/A;  . FOREARM FRACTURE SURGERY Right ~ 1961  . FRACTURE SURGERY    . HOLMIUM LASER APPLICATION Left [id number]   Procedure: HOLMIUM LASER APPLICATION;  Surgeon: Carolan Clines, MD;  Location: [facility];  Service: Urology;  Laterality: Left;  . JOINT REPLACEMENT    . KIDNEY STONE SURGERY  1980's   "stone lodged in the duct; had to cut me open to get it"  . KNEE ARTHROSCOPY Bilateral "multiple times"  . TEE WITHOUT CARDIOVERSION N/A 03/12/2015   Procedure: TRANSESOPHAGEAL ECHOCARDIOGRAM (TEE);  Surgeon: Pixie Casino, MD;  Location: [facility] ENDOSCOPY;  Service: Cardiovascular;  Laterality: N/A;  . TONSILLECTOMY    . TOTAL KNEE ARTHROPLASTY Left 2001   [facility]    Current Medications: Outpatient Medications Prior to Visit  Medication Sig Dispense Refill  .  acetaminophen (TYLENOL) 500 MG tablet Take 500-1,000 mg by mouth every 6 (six) hours as needed for headache.    . albuterol (PROVENTIL HFA;VENTOLIN HFA) 108 (90 BASE) MCG/ACT inhaler Inhale 2 puffs into the lungs every 6 (six) hours as needed for wheezing or shortness of breath.    . allopurinol (ZYLOPRIM) 300 MG tablet Take 300 mg by mouth daily.      Marland Kitchen ammonium lactate (LAC-HYDRIN) 12 % lotion Apply 1  application topically daily as needed for dry skin.    Marland Kitchen apixaban (ELIQUIS) 5 MG TABS tablet Take 1 tablet (5 mg total) by mouth 2 (two) times daily. 60 tablet 11  . atorvastatin (LIPITOR) 20 MG tablet TAKE 1 TABLET BY MOUTH EVERY DAY 90 tablet 0  . buPROPion (WELLBUTRIN XL) 300 MG 24 hr tablet TAKE 1 TABLET BY MOUTH DAILY 90 tablet 0  . diltiazem (CARDIZEM CD) 180 MG 24 hr capsule Take 1 capsule (180 mg total) by mouth 2 (two) times daily. 180 capsule 3  . fish oil-omega-3 fatty acids 1000 MG capsule Take 1 g by mouth daily.     Marland Kitchen lisinopril-hydrochlorothiazide (PRINZIDE,ZESTORETIC) 10-12.5 MG per tablet Take 0.5 tablets by mouth daily.    Marland Kitchen loratadine (CLARITIN) 10 MG tablet Take 10 mg by mouth daily.    Marland Kitchen LORazepam (ATIVAN) 0.5 MG tablet TAKE 1 TABLET TWICE DAILY AS NEEDED FOR ANXIETY. 20 tablet 0  . metoprolol succinate (TOPROL-XL) 25 MG 24 hr tablet TAKE 1 TABLET BY MOUTH DAILY 30 tablet 6  . mometasone (ASMANEX) 220 MCG/INH inhaler Inhale 2 puffs into the lungs 2 (two) times daily as needed (wheezing from bronchitis). Reported on 03/21/2016    . Multiple Vitamins-Minerals (MULTIVITAMIN WITH MINERALS) tablet Take 1 tablet by mouth daily.      Marland Kitchen olopatadine (PATANOL) 0.1 % ophthalmic solution Place 1 drop into both eyes daily as needed (seasonal allergies). 5 mL 11  . omeprazole (PRILOSEC OTC) 20 MG tablet Take 20 mg by mouth daily.    . silodosin (RAPAFLO) 4 MG CAPS capsule Take 4 mg by mouth at bedtime.     Marland Kitchen zolpidem (AMBIEN) 5 MG tablet Take 1 tablet (5 mg total) by mouth at bedtime as needed for sleep. 30 tablet 0  . busPIRone (BUSPAR) 10 MG tablet Take 1 tablet (10 mg total) by mouth 2 (two) times daily. 60 tablet 0  . Potassium Citrate 15 MEQ (1620 MG) TBCR Take 1 tablet by mouth 2 (two) times daily.     . prednisoLONE acetate (PRED FORTE) 1 % ophthalmic suspension Place 1 drop into both eyes See admin instructions. Starting 08/27/15: instill 1 drop into LEFT eye 4 times a day for one  week, then 3 times a day for one week, then 2 times a day for one week, then 1 time a day for one week, then stop. Starting 09/10/15: instill 1 drop into RIGHT eye 4 times a day for one week, then 3 times a day for one week, then 2 times a day for one week, then 1 time a day for one week, then stop.  1   No facility-administered medications prior to visit.      Allergies:   Codeine and Oxycodone   Social History   Social History  . Marital status: Married    Spouse name: N/A  . Number of children: N/A  . Years of education: N/A   Social History Main Topics  . Smoking status: Never Smoker  . Smokeless tobacco: Never Used  .  Alcohol use Yes     Comment: 01/21/2015 "maybe 4-5 drinks/yr"  . Drug use: No  . Sexual activity: Yes   Other Topics Concern  . None   Social History Narrative   Pt lives in [place] with spouse.  21 yo son is health.  Second son died in car accident.      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   ROS:   Please see the history of present illness.    Anxiety, difficulty sleeping, wants to be off of some of his medications, concerned about whether he should be seeing EP and general cardiology. Still having difficulties with food digestion. Some difficulty swallowing. Follows with Dr. Earlean Shawl for this problem. All other systems reviewed and are negative.   PHYSICAL EXAM:   VS:  BP 110/76   Pulse 71   Ht 6' (1.829 m)   Wt 243 lb (110.2 kg)   BMI 32.96 kg/m    GEN: Well nourished, well developed, in no acute distress  HEENT: normal  Neck: no JVD, carotid bruits, or masses Cardiac: RRR; 2-3/6  Murmur;  rubs, or gallops,no edema  Respiratory:  clear to auscultation bilaterally, normal work of breathing GI: soft, nontender, nondistended, + BS MS: no deformity or atrophy  Skin: warm  and dry, no rash Neuro:  Alert and Oriented x 3, Strength and sensation are intact Psych: euthymic mood, full affect  Wt Readings from Last 3 Encounters:  08/29/16 243 lb (110.2 kg)  08/16/16 248 lb (112.5 kg)  06/07/16 234 lb (106.1 kg)      Studies/Labs Reviewed:   EKG:  EKG  Normal sinus rhythm with normal appearing tracing. No change compared to prior.  Recent Labs: 08/16/2016: ALT 48; BUN 12; Creat 0.75; Hemoglobin 14.4; Platelets 217; Potassium 4.3; Sodium 140; TSH 1.40   Lipid Panel    Component Value Date/Time   CHOL 163 08/16/2016 0925   TRIG 140 08/16/2016 0925   HDL 45 08/16/2016 0925   CHOLHDL 3.6 08/16/2016 0925   VLDL 28 08/16/2016 0925   LDLCALC 90 08/16/2016 0925    Additional studies/ records that were reviewed today include:  Last echocardiogram 02/2014.    ASSESSMENT:    1. Paroxysmal atrial fibrillation (H[facility])   2. Essential hypertension   3. Aortic stenosis, moderate   4. OSA (obstructive sleep apnea)   5. Chest discomfort      PLAN:  In order of problems listed above:  1. Clinically stable post ablation. Current medical regimen includes Eliquis, diltiazem CD, and metoprolol XL 2. Relatively low blood pressure. Discontinue lisinopril HCT 3. Systolic murmur of aortic stenosis is noted. Last evaluated 2 and half years ago. Plan 2-D Doppler echocardiogram 4. Using CPAP without difficulty. 5. No significant episodes of chest discomfort. Left arm discomfort as noted above felt to be musculoskeletal or neurogenic. No specific workup at this time.    Medication Adjustments/Labs and Tests Ordered: Current medicines are reviewed at length with the patient today.  Concerns regarding medicines are outlined above.  Medication changes, Labs and Tests ordered today are listed in the Patient Instructions below. There are no Patient Instructions on file for this visit.   Signed, [name], MD  08/29/2016 12:44 PM    [place],  , [place]  [address] Phone: [phone]; Fax: [phone]  

## 2016-08-29 NOTE — Patient Instructions (Signed)
Medication Instructions:  Your physician has recommended you make the following change in your medication:  Stop lisinopril/HCTZ.   Labwork: none  Testing/Procedures: Your physician has requested that you have an echocardiogram. Echocardiography is a painless test that uses sound waves to create images of your heart. It provides your doctor with information about the size and shape of your heart and how well your heart's chambers and valves are working. This procedure takes approximately one hour. There are no restrictions for this procedure. Pt requests this be done with Will    Follow-Up: Your physician wants you to follow-up in: 12 months.  You will receive a reminder letter in the mail two months in advance. If you don't receive a letter, please call our office to schedule the follow-up appointment.  Pt is due for follow up with Roderic Palau, NP in November.  Please schedule this appointment for patient.   Any Other Special Instructions Will Be Listed Below (If Applicable).     If you need a refill on your cardiac medications before your next appointment, please call your pharmacy.

## 2016-09-01 ENCOUNTER — Telehealth: Payer: Self-pay | Admitting: Cardiology

## 2016-09-01 NOTE — Telephone Encounter (Signed)
Spoke w/ pt and requested that he send a manual transmission b/c his home monitor has not updated in at least 14 days.   

## 2016-09-02 ENCOUNTER — Other Ambulatory Visit: Payer: Self-pay | Admitting: Internal Medicine

## 2016-09-09 ENCOUNTER — Ambulatory Visit (INDEPENDENT_AMBULATORY_CARE_PROVIDER_SITE_OTHER): Payer: PPO | Admitting: *Deleted

## 2016-09-09 DIAGNOSIS — I480 Paroxysmal atrial fibrillation: Secondary | ICD-10-CM | POA: Diagnosis not present

## 2016-09-12 NOTE — Progress Notes (Signed)
Carelink Summary Report / Loop Recorder 

## 2016-09-13 ENCOUNTER — Ambulatory Visit (HOSPITAL_COMMUNITY): Payer: PPO | Attending: Cardiology

## 2016-09-23 ENCOUNTER — Encounter (HOSPITAL_COMMUNITY): Payer: Self-pay | Admitting: Nurse Practitioner

## 2016-09-23 ENCOUNTER — Ambulatory Visit (HOSPITAL_COMMUNITY)
Admission: RE | Admit: 2016-09-23 | Discharge: 2016-09-23 | Disposition: A | Discharge status: Home / Self Care | Payer: PPO | Source: Ambulatory Visit | Attending: Nurse Practitioner | Admitting: Nurse Practitioner

## 2016-09-23 VITALS — BP 136/84 | HR 74 | Ht 72.0 in | Wt 245.8 lb

## 2016-09-23 DIAGNOSIS — F329 Major depressive disorder, single episode, unspecified: Secondary | ICD-10-CM | POA: Insufficient documentation

## 2016-09-23 DIAGNOSIS — K219 Gastro-esophageal reflux disease without esophagitis: Secondary | ICD-10-CM | POA: Insufficient documentation

## 2016-09-23 DIAGNOSIS — Z79899 Other long term (current) drug therapy: Secondary | ICD-10-CM | POA: Insufficient documentation

## 2016-09-23 DIAGNOSIS — I480 Paroxysmal atrial fibrillation: Secondary | ICD-10-CM | POA: Diagnosis not present

## 2016-09-23 DIAGNOSIS — I481 Persistent atrial fibrillation: Secondary | ICD-10-CM | POA: Insufficient documentation

## 2016-09-23 DIAGNOSIS — N400 Enlarged prostate without lower urinary tract symptoms: Secondary | ICD-10-CM | POA: Diagnosis not present

## 2016-09-23 DIAGNOSIS — E785 Hyperlipidemia, unspecified: Secondary | ICD-10-CM | POA: Diagnosis not present

## 2016-09-23 DIAGNOSIS — I350 Nonrheumatic aortic (valve) stenosis: Secondary | ICD-10-CM | POA: Diagnosis not present

## 2016-09-23 DIAGNOSIS — Z7901 Long term (current) use of anticoagulants: Secondary | ICD-10-CM | POA: Insufficient documentation

## 2016-09-23 NOTE — Progress Notes (Signed)
Patient ID: Jeremy Tucker, male   DOB: [birth year], 65 y.o.   MRN: [id number]     Primary Care Physician: [name], MD Referring Physician: Dr. Quentin Mulling is a 65 y.o. male with a h/o persistent afib, s/p ablation, 03/12/15, and recently had a linq monitor placed,11/1, for some ongoing palpitations. Site healed and remote report showed PAC's but no significant afib burden. He still reports that he has some weak mornings and has to go back to bed. He was encouraged to send a report when that occurs again to see if rhythm has anything to do with these spells.  He f/u's in the afib clinic 2/20 and linq is interrogated and reviewed with Tomi Bamberger, [employer] rep. It appears that the majority of his irregular heart beat is PAC's and dicussed with pt. Overall, he is now retired 6 months and he and his wife are exercising more. He still has some days that he is more tired but cannot specifically correlate with an arrhythmia. He continues on DOAC, no bleeding issues.  F/u 09/22/16, he is doing great. No afib burden mentioned. He is walking regularly. Describes a shooting pain down left arm that lasts seconds, is not exertional. Pending f/u echo for mild aortic stenosis. Continues on Eliquis 5 mg bid without bleeding issues.  Today, he denies symptoms of palpitations, chest pain, shortness of breath, orthopnea, PND, lower extremity edema, dizziness, presyncope, syncope, or neurologic sequela. The patient is tolerating medications without difficulties and is otherwise without complaint today.   Past Medical History:  Diagnosis Date  . Aortic stenosis, mild   . BPH (benign prostatic hyperplasia)   . Depression   . DJD (degenerative joint disease)   . DVT (deep venous thrombosis) ([place])   . Dysrhythmia    Atrial fibrillation  . GERD (gastroesophageal reflux disease)   . Glucose intolerance (impaired glucose tolerance)   . Gout    denies  . Hyperlipidemia   . Hypertension    denies    . Kidney stones   . Obesity   . Persistent atrial fibrillation ([place])    a. CHADsVASc score at least 2   Past Surgical History:  Procedure Laterality Date  . ATRIAL FIBRILLATION ABLATION N/A 03/12/2015   Procedure: ATRIAL FIBRILLATION ABLATION;  Surgeon: [name], MD;  Location: [facility] CATH LAB;  Service: Cardiovascular;  Laterality: N/A;  . CARDIAC CATHETERIZATION  1990's   "Dr. Melvern Banker", no blockages per pt  . CARDIOVERSION N/A 02/25/2015   Procedure: CARDIOVERSION;  Surgeon: Sueanne Margarita, MD;  Location: [place];  Service: Cardiovascular;  Laterality: N/A;  . COLONOSCOPY  2010   [facility]  . CYSTOSCOPY W/ STONE MANIPULATION  1980's   "couldn't get stone so they had to cut me open"  . CYSTOSCOPY WITH RETROGRADE PYELOGRAM, URETEROSCOPY AND STENT PLACEMENT Left 04/24/2015   Procedure: URETHRAL MEATAL DILATION, CYSTOSCOPY WITH LEFT RETROGRADE PYELOGRAM, LEFT URETEROSCOPY, STONE BASKET EXTRACTION,  LEFT DOUBLE J STENT PLACEMENT;  Surgeon: Carolan Clines, MD;  Location: [facility] ORS;  Service: Urology;  Laterality: Left;  . EP IMPLANTABLE DEVICE N/A 09/15/2015   Procedure: Loop Recorder Insertion;  Surgeon: [name], MD;  Location: [place] CV LAB;  Service: Cardiovascular;  Laterality: N/A;  . FOREARM FRACTURE SURGERY Right ~ 1961  . FRACTURE SURGERY    . HOLMIUM LASER APPLICATION Left [id number]   Procedure: HOLMIUM LASER APPLICATION;  Surgeon: Carolan Clines, MD;  Location: [facility];  Service: Urology;  Laterality: Left;  . JOINT REPLACEMENT    .  KIDNEY STONE SURGERY  1980's   "stone lodged in the duct; had to cut me open to get it"  . KNEE ARTHROSCOPY Bilateral "multiple times"  . TEE WITHOUT CARDIOVERSION N/A 03/12/2015   Procedure: TRANSESOPHAGEAL ECHOCARDIOGRAM (TEE);  Surgeon: Pixie Casino, MD;  Location[facility] ENDOSCOPY;  Service: Cardiovascular;  Laterality: N/A;  . TONSILLECTOMY    . TOTAL KNEE ARTHROPLASTY Left 2001   [facility]    Current Outpatient Prescriptions   Medication Sig Dispense Refill  . acetaminophen (TYLENOL) 500 MG tablet Take 500-1,000 mg by mouth every 6 (six) hours as needed for headache.    . albuterol (PROVENTIL HFA;VENTOLIN HFA) 108 (90 BASE) MCG/ACT inhaler Inhale 2 puffs into the lungs every 6 (six) hours as needed for wheezing or shortness of breath.    . allopurinol (ZYLOPRIM) 300 MG tablet Take 300 mg by mouth daily.      Marland Kitchen ammonium lactate (LAC-HYDRIN) 12 % lotion Apply 1 application topically daily as needed for dry skin.    Marland Kitchen apixaban (ELIQUIS) 5 MG TABS tablet Take 1 tablet (5 mg total) by mouth 2 (two) times daily. 60 tablet 11  . atorvastatin (LIPITOR) 20 MG tablet TAKE 1 TABLET BY MOUTH EVERY DAY 90 tablet 0  . buPROPion (WELLBUTRIN XL) 300 MG 24 hr tablet TAKE 1 TABLET BY MOUTH DAILY (Patient taking differently: TAKE 1 /2 TABLET BY MOUTH DAILY) 90 tablet 0  . busPIRone (BUSPAR) 10 MG tablet Take 5 mg by mouth 2 (two) times daily.  2  . diltiazem (CARDIZEM CD) 180 MG 24 hr capsule TAKE 1 CAPSULE BY MOUTH TWICE DAILY 180 capsule 0  . fish oil-omega-3 fatty acids 1000 MG capsule Take 1 g by mouth daily.     Marland Kitchen loratadine (CLARITIN) 10 MG tablet Take 10 mg by mouth daily.    Marland Kitchen LORazepam (ATIVAN) 0.5 MG tablet TAKE 1 TABLET TWICE DAILY AS NEEDED FOR ANXIETY. 20 tablet 0  . metoprolol succinate (TOPROL-XL) 25 MG 24 hr tablet TAKE 1 TABLET BY MOUTH DAILY 30 tablet 6  . mometasone (ASMANEX) 220 MCG/INH inhaler Inhale 2 puffs into the lungs 2 (two) times daily as needed (wheezing from bronchitis). Reported on 03/21/2016    . Multiple Vitamins-Minerals (MULTIVITAMIN WITH MINERALS) tablet Take 1 tablet by mouth daily.      Marland Kitchen olopatadine (PATANOL) 0.1 % ophthalmic solution Place 1 drop into both eyes daily as needed (seasonal allergies). 5 mL 11  . omeprazole (PRILOSEC OTC) 20 MG tablet Take 20 mg by mouth daily.    . Potassium Citrate 15 MEQ (1620 MG) TBCR Take 15 mEq by mouth daily.  11  . silodosin (RAPAFLO) 4 MG CAPS capsule Take 4 mg  by mouth at bedtime.     Marland Kitchen zolpidem (AMBIEN) 5 MG tablet Take 1 tablet (5 mg total) by mouth at bedtime as needed for sleep. 30 tablet 0   No current facility-administered medications for this encounter.     Allergies  Allergen Reactions  . Codeine Itching  . Oxycodone Itching    Social History   Social History  . Marital status: Married    Spouse name: N/A  . Number of children: N/A  . Years of education: N/A   Occupational History  . Not on file.   Social History Main Topics  . Smoking status: Never Smoker  . Smokeless tobacco: Never Used  . Alcohol use Yes     Comment: 01/21/2015 "maybe 4-5 drinks/yr"  . Drug use: No  . Sexual  activity: Yes   Other Topics Concern  . Not on file   Social History Narrative   Pt lives in [place] with spouse.  10 yo son is health.  Second son died in car accident.      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 Heart attack Mother 52    MI  . Heart attack Father 14    ? MI versus trauma to head  . Healthy Brother     ROS- All systems are reviewed and negative except as per the HPI above  Physical Exam: Vitals:   09/23/16 0953  BP: 136/84  Pulse: 74  Weight: 245 lb 12.8 oz (111.5 kg)  Height: 6' (1.829 m)    GEN- The patient is well appearing, alert and oriented x 3 today.   Head- normocephalic, atraumatic Eyes-  Sclera clear, conjunctiva pink Ears- hearing intact Oropharynx- clear Neck- supple, no JVP Lymph- no cervical lymphadenopathy Lungs- Clear to ausculation bilaterally, normal work of breathing Heart- Regular rate and rhythm, 2/6  murmurs, rubs or gallops, PMI not laterally displaced GI- soft, NT, ND, + BS Extremities- no clubbing, cyanosis, or edema MS- no significant deformity or atrophy Skin- no rash or lesion Psych- euthymic mood, full affect Neuro- strength and sensation are intact  EKG- NSR, rate 74 bpm, pr int 160 ms, qrs int 86 ms, qtc 432 ms. Epic  records reviewed.  Assessment and Plan: 1. Afib In SR, not aware of palpitations at this point  Linq in place Continue with remote trasmissions Continue eliquis, chadsvasc of 2 Continue cardizem Continue with walking program  2. Aortic stenosis Followed by Dr. Tamala Julian Echo pending  F/u in afib clinic in 9 months, sooner if needed  Butch Penny C. Carroll, [facility] [address] [place], [place] [address] [phone]

## 2016-10-04 ENCOUNTER — Telehealth: Payer: Self-pay | Admitting: Interventional Cardiology

## 2016-10-04 NOTE — Telephone Encounter (Signed)
New Message  Pt call requesting to speak with RN about future echo. Please call back to discuss

## 2016-10-10 ENCOUNTER — Other Ambulatory Visit: Payer: Self-pay | Admitting: Family Medicine

## 2016-10-10 ENCOUNTER — Ambulatory Visit (INDEPENDENT_AMBULATORY_CARE_PROVIDER_SITE_OTHER): Payer: PPO | Admitting: *Deleted

## 2016-10-10 DIAGNOSIS — I480 Paroxysmal atrial fibrillation: Secondary | ICD-10-CM

## 2016-10-11 ENCOUNTER — Other Ambulatory Visit: Payer: Self-pay

## 2016-10-11 ENCOUNTER — Ambulatory Visit (HOSPITAL_COMMUNITY): Payer: PPO | Attending: Interventional Cardiology

## 2016-10-11 ENCOUNTER — Telehealth: Payer: Self-pay

## 2016-10-11 DIAGNOSIS — I350 Nonrheumatic aortic (valve) stenosis: Secondary | ICD-10-CM | POA: Insufficient documentation

## 2016-10-11 DIAGNOSIS — I351 Nonrheumatic aortic (valve) insufficiency: Secondary | ICD-10-CM | POA: Diagnosis not present

## 2016-10-11 DIAGNOSIS — I422 Other hypertrophic cardiomyopathy: Secondary | ICD-10-CM | POA: Diagnosis not present

## 2016-10-11 LAB — ECHOCARDIOGRAM COMPLETE
AO mean calculated velocity dopler: 189 cm/s
AV Area VTI index: 1.2 cm2/m2
AV Area VTI: 4.36 cm2
AV Area mean vel: 2.58 cm2
AV Mean grad: 17 mmHg
AV Peak grad: 37 mmHg
AV VEL mean LVOT/AV: 0.57
AV area mean vel ind: 1.11 cm2/m2
AV peak Index: 1.87
AV pk vel: 305 cm/s
AV vel: 2.79
Ao pk vel: 0.96 m/s
Ao-asc: 34 cm
E decel time: 264 msec
FS: 39 % (ref 28–44)
IVS/LV PW RATIO, ED: 1.35
LA ID, A-P, ES: 43 mm
LA diam end sys: 43 mm
LA diam index: 1.85 cm/m2
LA vol A4C: 78 ml
LA vol index: 35.2 mL/m2
LA vol: 82 mL
LV PW d: 11.9 mm — AB (ref 0.6–1.1)
LVOT SV: 174 mL
LVOT VTI: 38.5 cm
LVOT area: 4.52 cm2
LVOT diameter: 24 mm
LVOT peak VTI: 0.62 cm
LVOT peak grad rest: 35 mmHg
LVOT peak vel: 294 cm/s
MV Dec: 264
MV Peak grad: 2 mmHg
MV pk E vel: 71.6 m/s
RV sys press: 25 mmHg
Reg peak vel: 235 cm/s
TR max vel: 235 cm/s
VTI: 62.4 cm
Valve area index: 1.2
Valve area: 2.79 cm2

## 2016-10-11 NOTE — Telephone Encounter (Signed)
Samples of Eliquis 5mg  2 boxes provided to patient.

## 2016-10-12 NOTE — Progress Notes (Signed)
Carelink Summary Report / Loop Recorder 

## 2016-10-16 LAB — CUP PACEART REMOTE DEVICE CHECK
Date Time Interrogation Session: 20171027153539
Implantable Pulse Generator Implant Date: 20161101

## 2016-10-16 NOTE — Progress Notes (Signed)
Carelink summary report received. Battery status OK. Normal device function. No new symptom episodes, tachy episodes, brady, or pause episodes. 194 AF episodes 37.6% -known AF +Eliquis. Monthly summary reports and ROV/PRN

## 2016-10-18 ENCOUNTER — Other Ambulatory Visit: Payer: Self-pay | Admitting: Family Medicine

## 2016-10-18 NOTE — Telephone Encounter (Signed)
Is this okay to refill? 

## 2016-11-02 ENCOUNTER — Other Ambulatory Visit: Payer: Self-pay | Admitting: *Deleted

## 2016-11-02 NOTE — Telephone Encounter (Signed)
2 BOXES OF SAMPLES PLACED UP FRONT

## 2016-11-08 ENCOUNTER — Ambulatory Visit (INDEPENDENT_AMBULATORY_CARE_PROVIDER_SITE_OTHER): Payer: PPO | Admitting: *Deleted

## 2016-11-08 DIAGNOSIS — I480 Paroxysmal atrial fibrillation: Secondary | ICD-10-CM

## 2016-11-08 LAB — CUP PACEART REMOTE DEVICE CHECK
Date Time Interrogation Session: 20171226163858
Implantable Pulse Generator Implant Date: 20161101

## 2016-11-09 NOTE — Progress Notes (Signed)
Carelink Summary Report / Loop Recorder 

## 2016-11-19 LAB — CUP PACEART REMOTE DEVICE CHECK
Date Time Interrogation Session: 20171126170310
Implantable Pulse Generator Implant Date: 20161101

## 2016-11-19 NOTE — Progress Notes (Signed)
Carelink summary report received. Battery status OK. Normal device function. No new symptom episodes, tachy episodes, brady, or pause episodes. 322 AF 50% +Eliquis. Monthly summary reports and ROV/PRN

## 2016-11-25 ENCOUNTER — Other Ambulatory Visit: Payer: Self-pay | Admitting: Interventional Cardiology

## 2016-11-25 NOTE — Telephone Encounter (Signed)
Patient called to follow up on refill request as he is out of this medication and his pharmacy claims that they sent the request to the office days ago.

## 2016-12-08 ENCOUNTER — Ambulatory Visit (INDEPENDENT_AMBULATORY_CARE_PROVIDER_SITE_OTHER): Payer: PPO | Admitting: *Deleted

## 2016-12-08 DIAGNOSIS — I480 Paroxysmal atrial fibrillation: Secondary | ICD-10-CM | POA: Diagnosis not present

## 2016-12-08 DIAGNOSIS — L720 Epidermal cyst: Secondary | ICD-10-CM | POA: Diagnosis not present

## 2016-12-08 DIAGNOSIS — D485 Neoplasm of uncertain behavior of skin: Secondary | ICD-10-CM | POA: Diagnosis not present

## 2016-12-08 DIAGNOSIS — L821 Other seborrheic keratosis: Secondary | ICD-10-CM | POA: Diagnosis not present

## 2016-12-08 DIAGNOSIS — D1801 Hemangioma of skin and subcutaneous tissue: Secondary | ICD-10-CM | POA: Diagnosis not present

## 2016-12-08 DIAGNOSIS — L814 Other melanin hyperpigmentation: Secondary | ICD-10-CM | POA: Diagnosis not present

## 2016-12-08 DIAGNOSIS — D225 Melanocytic nevi of trunk: Secondary | ICD-10-CM | POA: Diagnosis not present

## 2016-12-08 DIAGNOSIS — C44311 Basal cell carcinoma of skin of nose: Secondary | ICD-10-CM | POA: Diagnosis not present

## 2016-12-09 NOTE — Progress Notes (Signed)
Carelink Summary Report / Loop Recorder 

## 2016-12-14 DIAGNOSIS — C44311 Basal cell carcinoma of skin of nose: Secondary | ICD-10-CM | POA: Diagnosis not present

## 2016-12-24 NOTE — Progress Notes (Signed)
Carelink summary report received. Battery status OK. Normal device function. No new symptom episodes, tachy episodes, brady, or pause episodes. 311 AF episodes 35% +eliquis. Monthly summary reports and ROV/PRN

## 2017-01-03 DIAGNOSIS — M25522 Pain in left elbow: Secondary | ICD-10-CM | POA: Diagnosis not present

## 2017-01-06 ENCOUNTER — Other Ambulatory Visit: Payer: Self-pay | Admitting: Internal Medicine

## 2017-01-08 LAB — CUP PACEART REMOTE DEVICE CHECK
Date Time Interrogation Session: 20180125170600
Implantable Pulse Generator Implant Date: 20161101

## 2017-01-09 ENCOUNTER — Ambulatory Visit (INDEPENDENT_AMBULATORY_CARE_PROVIDER_SITE_OTHER): Payer: PPO | Admitting: *Deleted

## 2017-01-09 DIAGNOSIS — I480 Paroxysmal atrial fibrillation: Secondary | ICD-10-CM | POA: Diagnosis not present

## 2017-01-09 NOTE — Progress Notes (Signed)
Carelink Summary Report / Loop Recorder 

## 2017-01-10 DIAGNOSIS — M25522 Pain in left elbow: Secondary | ICD-10-CM | POA: Diagnosis not present

## 2017-01-10 DIAGNOSIS — Z961 Presence of intraocular lens: Secondary | ICD-10-CM | POA: Diagnosis not present

## 2017-01-20 ENCOUNTER — Other Ambulatory Visit (HOSPITAL_COMMUNITY): Payer: Self-pay | Admitting: Nurse Practitioner

## 2017-01-20 ENCOUNTER — Other Ambulatory Visit: Payer: Self-pay | Admitting: Family Medicine

## 2017-01-20 NOTE — Telephone Encounter (Signed)
Can this patient have a refill on this ? 

## 2017-01-20 NOTE — Telephone Encounter (Signed)
Have him come in for an appt

## 2017-01-24 DIAGNOSIS — M25522 Pain in left elbow: Secondary | ICD-10-CM | POA: Diagnosis not present

## 2017-01-26 LAB — CUP PACEART REMOTE DEVICE CHECK
Date Time Interrogation Session: 20180224170743
Implantable Pulse Generator Implant Date: 20161101

## 2017-01-31 DIAGNOSIS — R3915 Urgency of urination: Secondary | ICD-10-CM | POA: Diagnosis not present

## 2017-01-31 DIAGNOSIS — N5201 Erectile dysfunction due to arterial insufficiency: Secondary | ICD-10-CM | POA: Diagnosis not present

## 2017-01-31 DIAGNOSIS — N401 Enlarged prostate with lower urinary tract symptoms: Secondary | ICD-10-CM | POA: Diagnosis not present

## 2017-01-31 DIAGNOSIS — N200 Calculus of kidney: Secondary | ICD-10-CM | POA: Diagnosis not present

## 2017-02-06 ENCOUNTER — Ambulatory Visit (INDEPENDENT_AMBULATORY_CARE_PROVIDER_SITE_OTHER): Payer: PPO | Admitting: *Deleted

## 2017-02-06 DIAGNOSIS — I480 Paroxysmal atrial fibrillation: Secondary | ICD-10-CM

## 2017-02-07 NOTE — Progress Notes (Signed)
Carelink Summary Report / Loop Recorder 

## 2017-02-14 DIAGNOSIS — M25522 Pain in left elbow: Secondary | ICD-10-CM | POA: Diagnosis not present

## 2017-02-22 LAB — CUP PACEART REMOTE DEVICE CHECK
Date Time Interrogation Session: 20180326173855
Implantable Pulse Generator Implant Date: 20161101

## 2017-02-28 DIAGNOSIS — M25522 Pain in left elbow: Secondary | ICD-10-CM | POA: Diagnosis not present

## 2017-03-08 ENCOUNTER — Ambulatory Visit (INDEPENDENT_AMBULATORY_CARE_PROVIDER_SITE_OTHER): Payer: PPO | Admitting: *Deleted

## 2017-03-08 DIAGNOSIS — I480 Paroxysmal atrial fibrillation: Secondary | ICD-10-CM | POA: Diagnosis not present

## 2017-03-09 NOTE — Progress Notes (Signed)
Carelink Summary Report / Loop Recorder 

## 2017-03-14 ENCOUNTER — Telehealth: Payer: Self-pay | Admitting: Family Medicine

## 2017-03-14 NOTE — Telephone Encounter (Signed)
Pt called and would like for u to call him he has started wheezing and coughing, he is coming in on Thursday but would like for you to call him and discuss his wheezing with him he can be reached at [phone]

## 2017-03-16 ENCOUNTER — Other Ambulatory Visit: Payer: Self-pay

## 2017-03-16 ENCOUNTER — Ambulatory Visit (INDEPENDENT_AMBULATORY_CARE_PROVIDER_SITE_OTHER): Payer: PPO | Admitting: Family Medicine

## 2017-03-16 ENCOUNTER — Encounter: Payer: Self-pay | Admitting: Family Medicine

## 2017-03-16 VITALS — BP 130/80 | HR 90 | Wt 245.0 lb

## 2017-03-16 DIAGNOSIS — J4531 Mild persistent asthma with (acute) exacerbation: Secondary | ICD-10-CM | POA: Diagnosis not present

## 2017-03-16 DIAGNOSIS — I152 Hypertension secondary to endocrine disorders: Secondary | ICD-10-CM

## 2017-03-16 DIAGNOSIS — E1169 Type 2 diabetes mellitus with other specified complication: Secondary | ICD-10-CM | POA: Diagnosis not present

## 2017-03-16 DIAGNOSIS — E1159 Type 2 diabetes mellitus with other circulatory complications: Secondary | ICD-10-CM

## 2017-03-16 DIAGNOSIS — E118 Type 2 diabetes mellitus with unspecified complications: Secondary | ICD-10-CM | POA: Diagnosis not present

## 2017-03-16 DIAGNOSIS — F419 Anxiety disorder, unspecified: Secondary | ICD-10-CM

## 2017-03-16 DIAGNOSIS — I480 Paroxysmal atrial fibrillation: Secondary | ICD-10-CM | POA: Diagnosis not present

## 2017-03-16 DIAGNOSIS — J4521 Mild intermittent asthma with (acute) exacerbation: Secondary | ICD-10-CM | POA: Diagnosis not present

## 2017-03-16 DIAGNOSIS — I10 Essential (primary) hypertension: Secondary | ICD-10-CM

## 2017-03-16 DIAGNOSIS — E785 Hyperlipidemia, unspecified: Secondary | ICD-10-CM | POA: Diagnosis not present

## 2017-03-16 DIAGNOSIS — G4700 Insomnia, unspecified: Secondary | ICD-10-CM | POA: Diagnosis not present

## 2017-03-16 LAB — POCT GLYCOSYLATED HEMOGLOBIN (HGB A1C): Hemoglobin A1C: 6.9

## 2017-03-16 MED ORDER — ZOLPIDEM TARTRATE 5 MG PO TABS: 5.0000 mg | ORAL_TABLET | Freq: Every evening | ORAL | 0 refills | Status: DC | PRN

## 2017-03-16 MED ORDER — AMOXICILLIN 875 MG PO TABS: 875.0000 mg | ORAL_TABLET | Freq: Two times a day (BID) | ORAL | 0 refills | Status: DC

## 2017-03-16 NOTE — Progress Notes (Signed)
   Subjective:    Patient ID: Jeremy Tucker, male    DOB: 1950-11-18, 66 y.o.   MRN: [id number]  HPI He is here for evaluation of multiple issues. He does have an underlying history of asthma and has been using his medications inappropriately. He is using albuterol regularly and only intermittently taking the Asmanex and usually in higher than regular doses. So far he has not been checking his blood sugars. His exercise is 4 days a week but the other 3 days he is involved in any physical activities. Not check his feet regularly. Does not smoke. He is now retired and has been doing some traveling lately. He recently suffered a fracture to the head of the radius and is still recovering from that. He has weaned himself down to 1 BuSpar per day as well as 1 Wellbutrin and seems be doing well with that. He does intermittently use Ambien. He is being followed by cardiology for his PAF. His medications were reviewed. Complains of a four-day history of increased difficulty with cough, wheezing and weakness but no fever, chills, sore throat, earache. He does not smoke. He has done quite well using his Ambien much more sparingly.   Review of Systems     Objective:   Physical Exam Alert and in no distress. Tympanic membranes and canals are normal. Pharyngeal area is normal. Neck is supple without adenopathy or thyromegaly. Cardiac exam shows a regular sinus rhythm without murmurs or gallops. Lungs show expiratory wheezing and occasional rhonchi. Hemoglobin A1c is 6.9.       Assessment & Plan:  Paroxysmal atrial fibrillation (HCC)  Hypertension associated with diabetes ([place])  Hyperlipidemia associated with type 2 diabetes mellitus (HCC)  Mild intermittent asthmatic bronchitis with acute exacerbation - Plan: HgB A1c, amoxicillin (AMOXIL) 875 MG tablet  Sleep disturbance  Mild persistent asthma with acute exacerbation  Insomnia, unspecified type - Plan: zolpidem (AMBIEN) 5 MG tablet  Controlled  type 2 diabetes mellitus with complication, without long-term current use of insulin ([place]) - Plan: HgB A1c He will continue to be followed by cardiology for his PAF. He is stable on his present blood pressure medication as well as statin. I reviewed the use of his inhalers. Recommend that he regularly use his Asmanex, 2 puffs and then going down the one after he clears the present infection and intermittently use albuterol. If he uses the albuterol more than twice per week during the day or twice a month at night, we will need to readjust the Asmanex. He will continue to use the Ambien on an as-needed basis. Recommended he cut back on his BuSpar by tapering to half a pill per day for the next week or 2 and then stopping that. Also consider stopping the Wellbutrin. Did discuss anxiety and stress with him. Discussed normal stress versus distress. He is also to start checking his blood sugars either before a meal or 2 hours after meal. Return here in 4 months.

## 2017-03-16 NOTE — Patient Instructions (Signed)
Use the Asmanex daily. Use the albuterol as needed but if you need to use this more than twice a week during the day or twice a month at night and I need to know Check your sugars periodically either before a meal or 2 hours after meal 20 minutes a day of something physical

## 2017-03-17 ENCOUNTER — Telehealth: Payer: Self-pay

## 2017-03-17 MED ORDER — MOMETASONE FUROATE 220 MCG/INH IN AEPB: 1.0000 | INHALATION_SPRAY | Freq: Two times a day (BID) | RESPIRATORY_TRACT | 5 refills | Status: DC

## 2017-03-17 NOTE — Telephone Encounter (Signed)
Pt needs Asamanex called to [facility].

## 2017-03-17 NOTE — Telephone Encounter (Signed)
Let him know that I called in and he can take 1 puff twice a day or 2 puffs at one time

## 2017-03-17 NOTE — Telephone Encounter (Signed)
Pt aware called to pharmacy. Jeremy Tucker

## 2017-03-27 ENCOUNTER — Telehealth: Payer: Self-pay | Admitting: Family Medicine

## 2017-03-27 NOTE — Telephone Encounter (Signed)
Finished antibiotic and still wheezing. Call pt at [phone]

## 2017-03-27 NOTE — Telephone Encounter (Signed)
He called stating he is coughing and wheezing but has not started on the Asmanex stating cost $45. I recommended that he check with the pharmacy and see if another drug out of the same class is less expensive. He will let me know

## 2017-03-28 ENCOUNTER — Telehealth: Payer: Self-pay | Admitting: Interventional Cardiology

## 2017-03-28 ENCOUNTER — Other Ambulatory Visit: Payer: Self-pay | Admitting: Family Medicine

## 2017-03-28 DIAGNOSIS — M25522 Pain in left elbow: Secondary | ICD-10-CM | POA: Diagnosis not present

## 2017-03-28 DIAGNOSIS — M25512 Pain in left shoulder: Secondary | ICD-10-CM | POA: Diagnosis not present

## 2017-03-28 NOTE — Telephone Encounter (Signed)
PT  WAS  GIVEN DOSE  PAK FOR  [employer] WANTED TO MAKE  SURE  THAT  WOULD NOT  INTERFERE  WITH  ANY OF  CURRENT  MEDS  DISCUSSED AND  REVIEWED WITH  [facility]  PER  [facility]  PT  MAY  TAKE  DOSE PAK AS  DIRECTED  the patient'S WIFE  AWARE .Adonis Housekeeper

## 2017-03-28 NOTE — Telephone Encounter (Signed)
°  New message   Pt wants to take some steroids (would not give me the name) but would like a call back to see if he can take them with his current meds

## 2017-03-29 LAB — CUP PACEART REMOTE DEVICE CHECK
Date Time Interrogation Session: 20180425180631
Implantable Pulse Generator Implant Date: 20161101

## 2017-04-07 ENCOUNTER — Ambulatory Visit (INDEPENDENT_AMBULATORY_CARE_PROVIDER_SITE_OTHER): Payer: PPO | Admitting: *Deleted

## 2017-04-07 DIAGNOSIS — I480 Paroxysmal atrial fibrillation: Secondary | ICD-10-CM | POA: Diagnosis not present

## 2017-04-07 NOTE — Progress Notes (Signed)
Carelink Summary Report / Loop Recorder 

## 2017-04-13 LAB — CUP PACEART REMOTE DEVICE CHECK
Date Time Interrogation Session: 20180525180852
Implantable Pulse Generator Implant Date: 20161101

## 2017-04-24 NOTE — Progress Notes (Signed)
Cardiology Office Note   Date:  04/25/2017   ID:  Jeremy Tucker, DOB 11/12/1951, MRN [id number]  PCP:  Denita Lung, MD  Cardiologist:  Dr. Tamala Julian     Chief Complaint  Patient presents with  . Chest Pain    fatigue      History of Present Illness: Jeremy Tucker is a 66 y.o. male who presents for PAF with hx of ablation 02/2015, HTN, mild aortic stenosis, GERD, HLD and anxiety disorder, chronic anticoagulation with Eliquis. .  Last visit with Dr. Tamala Julian his lisinopril hct was stopped due to low BP.   He uses CPaP fpr OSA.   Echo 09/2016 with mild LVH, EF 65-70%  Moderate AS, PA pk pressure 25 mmHg.  linq monitor placed in nov 2017. 219 AF episodes (35.1% burden)+Eliquis.    Today pt complains of chest pain while in [place] with walking.  Lt ant chest.  He also complains of no energy.  His Linq has not been working well.  Today he is in SR with PACs at home his HR will be up to 98 on some days. He has one other episode of not feeling well with mild chest pain.  No associated symptoms.  Last cardiac cath was in the 90s and was normal.  nuc in 2016 without ischemia.  Yesterday pt walked in park and he had no chest pain but + fatigue.    Pacer clinic nurse explained linq and he will send in strip.       Past Medical History:  Diagnosis Date  . Aortic stenosis, mild   . BPH (benign prostatic hyperplasia)   . Controlled diabetes mellitus type 2 with complications ([place])   . Depression   . DJD (degenerative joint disease)   . DVT (deep venous thrombosis) ([place])   . Dysrhythmia    Atrial fibrillation  . GERD (gastroesophageal reflux disease)   . Glucose intolerance (impaired glucose tolerance)   . Gout    denies  . Hyperlipidemia   . Hyperlipidemia associated with type 2 diabetes mellitus ([place])   . Hypertension    denies  . Hypertension associated with diabetes ([place])   . Kidney stones   . Obesity   . Persistent atrial fibrillation ([place])    a. CHADsVASc score at least 2     Past Surgical History:  Procedure Laterality Date  . ATRIAL FIBRILLATION ABLATION N/A 03/12/2015   Procedure: ATRIAL FIBRILLATION ABLATION;  Surgeon: [name], MD;  Location: [facility] CATH LAB;  Service: Cardiovascular;  Laterality: N/A;  . CARDIAC CATHETERIZATION  1990's   "Dr. Melvern Banker", no blockages per pt  . CARDIOVERSION N/A 02/25/2015   Procedure: CARDIOVERSION;  Surgeon: Sueanne Margarita, MD;  Location: [facility];  Service: Cardiovascular;  Laterality: N/A;  . COLONOSCOPY  2010   [facility]  . CYSTOSCOPY W/ STONE MANIPULATION  1980's   "couldn't get stone so they had to cut me open"  . CYSTOSCOPY WITH RETROGRADE PYELOGRAM, URETEROSCOPY AND STENT PLACEMENT Left 04/24/2015   Procedure: URETHRAL MEATAL DILATION, CYSTOSCOPY WITH LEFT RETROGRADE PYELOGRAM, LEFT URETEROSCOPY, STONE BASKET EXTRACTION,  LEFT DOUBLE J STENT PLACEMENT;  Surgeon: Carolan Clines, MD;  Location: [facility];  Service: Urology;  Laterality: Left;  . EP IMPLANTABLE DEVICE N/A 09/15/2015   Procedure: Loop Recorder Insertion;  Surgeon: [name], MD;  Location: [facility] CV LAB;  Service: Cardiovascular;  Laterality: N/A;  . FOREARM FRACTURE SURGERY Right ~ 1961  . FRACTURE SURGERY    . HOLMIUM  LASER APPLICATION Left 4/48/1856   Procedure: HOLMIUM LASER APPLICATION;  Surgeon: Carolan Clines, MD;  [facility]: [facility];  Service: Urology;  Laterality: Left;  . JOINT REPLACEMENT    . KIDNEY STONE SURGERY  1980's   "stone lodged in the duct; had to cut me open to get it"  . KNEE ARTHROSCOPY Bilateral "multiple times"  . TEE WITHOUT CARDIOVERSION N/A 03/12/2015   Procedure: TRANSESOPHAGEAL ECHOCARDIOGRAM (TEE);  Surgeon: Pixie Casino, MD;  Location: [facility] ENDOSCOPY;  Service: Cardiovascular;  Laterality: N/A;  . TONSILLECTOMY    . TOTAL KNEE ARTHROPLASTY Left 2001   [facility]     Current Outpatient Prescriptions  Medication Sig Dispense Refill  . acetaminophen (TYLENOL) 500 MG tablet Take 500-1,000 mg by mouth every 6  (six) hours as needed for headache.    . albuterol (PROVENTIL HFA;VENTOLIN HFA) 108 (90 BASE) MCG/ACT inhaler Inhale 2 puffs into the lungs every 6 (six) hours as needed for wheezing or shortness of breath.    . allopurinol (ZYLOPRIM) 300 MG tablet Take 300 mg by mouth daily.      Marland Kitchen ammonium lactate (LAC-HYDRIN) 12 % lotion Apply 1 application topically daily as needed for dry skin.    Marland Kitchen amoxicillin (AMOXIL) 875 MG tablet Take 1 tablet (875 mg total) by mouth 2 (two) times daily. 20 tablet 0  . apixaban (ELIQUIS) 5 MG TABS tablet Take 1 tablet (5 mg total) by mouth 2 (two) times daily. 60 tablet 11  . atorvastatin (LIPITOR) 20 MG tablet TAKE 1 TABLET BY MOUTH EVERY DAY 90 tablet 0  . buPROPion (WELLBUTRIN XL) 300 MG 24 hr tablet TAKE 1 TABLET BY MOUTH DAILY 30 tablet 0  . busPIRone (BUSPAR) 10 MG tablet Take 5 mg by mouth 2 (two) times daily.  2  . diltiazem (CARDIZEM CD) 180 MG 24 hr capsule TAKE 1 CAPSULE BY MOUTH TWICE DAILY 180 capsule 2  . fish oil-omega-3 fatty acids 1000 MG capsule Take 1 g by mouth daily.     Marland Kitchen loratadine (CLARITIN) 10 MG tablet Take 10 mg by mouth daily.    Marland Kitchen LORazepam (ATIVAN) 0.5 MG tablet TAKE 1 TABLET TWICE DAILY AS NEEDED FOR ANXIETY. 20 tablet 0  . metoprolol succinate (TOPROL-XL) 25 MG 24 hr tablet TAKE 1 TABLET BY MOUTH DAILY 30 tablet 6  . mometasone (ASMANEX) 220 MCG/INH inhaler Inhale 1 puff into the lungs 2 (two) times daily. Reported on 03/21/2016 1 Inhaler 5  . Multiple Vitamins-Minerals (MULTIVITAMIN WITH MINERALS) tablet Take 1 tablet by mouth daily.      Marland Kitchen omeprazole (PRILOSEC OTC) 20 MG tablet Take 20 mg by mouth daily.    . Potassium Citrate 15 MEQ (1620 MG) TBCR Take 15 mEq by mouth daily.  11  . silodosin (RAPAFLO) 4 MG CAPS capsule Take 4 mg by mouth at bedtime.     Marland Kitchen zolpidem (AMBIEN) 5 MG tablet Take 1 tablet (5 mg total) by mouth at bedtime as needed for sleep. 30 tablet 0   No current facility-administered medications for this visit.      Allergies:   Codeine and Oxycodone    Social History:  The patient  reports that he has never smoked. He has never used smokeless tobacco. He reports that he drinks alcohol. He reports that he does not use drugs.   Family History:  The patient's family history includes Healthy in his brother; Heart attack (age of onset: 51) in his father; Heart attack (age of onset: 9) in his mother.  ROS:  General:no colds or fevers, no weight changes Skin:no rashes or ulcers HEENT:no blurred vision, no congestion CV:see HPI PUL:see HPI GI:no diarrhea constipation or melena, no indigestion GU:no hematuria, no dysuria MS:no joint pain, no claudication Neuro:no syncope, no lightheadedness, + sleep apnea but cannot wear mask Endo:no diabetes, no thyroid disease  Wt Readings from Last 3 Encounters:  04/25/17 242 lb 12.8 oz (110.1 kg)  03/16/17 245 lb (111.1 kg)  09/23/16 245 lb 12.8 oz (111.5 kg)     PHYSICAL EXAM: VS:  BP 102/70   Pulse 76   Ht 6' (1.829 m)   Wt 242 lb 12.8 oz (110.1 kg)   SpO2 97%   BMI 32.93 kg/m  , BMI Body mass index is 32.93 kg/m. General:Pleasant affect, NAD Skin:Warm and dry, brisk capillary refill HEENT:normocephalic, sclera clear, mucus membranes moist Neck:supple, no JVD, no bruits  Heart:S1S2 RRR with 2/6 systolic murmur, no gallup, rub or click Lungs:clear without rales, rhonchi, or wheezes GUY:QIHK, non tender, + BS, do not palpate liver spleen or masses Ext:no lower ext edema, 2+ pedal pulses, 2+ radial pulses Neuro:alert and oriented X 3, MAE, follows commands, + facial symmetry    EKG:  EKG is ordered today. The ekg ordered today demonstrates SR with PACs    Recent Labs: 08/16/2016: ALT 48; BUN 12; Creat 0.75; Hemoglobin 14.4; Platelets 217; Potassium 4.3; Sodium 140; TSH 1.40    Lipid Panel    Component Value Date/Time   CHOL 163 08/16/2016 0925   TRIG 140 08/16/2016 0925   HDL 45 08/16/2016 0925   CHOLHDL 3.6 08/16/2016 0925   VLDL  28 08/16/2016 0925   LDLCALC 90 08/16/2016 0925       Other studies Reviewed: Additional studies/ records that were reviewed today include: . Echo 09/2016 Study Conclusions  - Left ventricle: The cavity size was normal. Wall thickness was   increased in a pattern of mild LVH. There was moderate focal   basal hypertrophy of the septum. Systolic function was vigorous.   The estimated ejection fraction was in the range of 65% to 70%.   The study is not technically sufficient to allow evaluation of LV   diastolic function. - Aortic valve: Moderately calcified trileaflet valve with moderate   stenosis. There was mild regurgitation. Mean gradient (S): 17 mm   Hg. Peak gradient (S): 33 mm Hg. Valve area (Vmax): 1.3 cm^2. - Left atrium: The atrium was mildly dilated. - Tricuspid valve: There was trivial regurgitation. - Pulmonary arteries: PA peak pressure: 25 mm Hg (S). - Inferior vena cava: The vessel was normal in size. The   respirophasic diameter changes were in the normal range (>= 50%),   consistent with normal central venous pressure.  Impressions:  - Compared to a prior echo in 2016, the LV is higher at 65-70%.   There is still moderate aortic stenosis.  ASSESSMENT AND PLAN:  1.  Chest pain will do exercise myoview.  If + will have him follow up with Dr. Tamala Julian and if Neg with Roderic Palau.   2.  PAF on Eliquis has not missed any doses.  On torpol and dilt.  He will send in Linq to see if more a fib.    3. Essential HTN stable well controlled to low off of lisinopril hctz.  4. AS moderate.    Current medicines are reviewed with the patient today.  The patient Has no concerns regarding medicines.  The following changes have been made:  See above  Labs/ tests ordered today include:see above  Disposition:   FU:  see above  Signed, Cecilie Kicks, NP  04/25/2017 12:33 PM    [facility], [place], [place], [place] Phone: [phone]; Fax: [phone]

## 2017-04-25 ENCOUNTER — Ambulatory Visit (INDEPENDENT_AMBULATORY_CARE_PROVIDER_SITE_OTHER): Payer: PPO | Admitting: Cardiology

## 2017-04-25 ENCOUNTER — Encounter: Payer: Self-pay | Admitting: Cardiology

## 2017-04-25 VITALS — BP 102/70 | HR 76 | Ht 72.0 in | Wt 242.8 lb

## 2017-04-25 DIAGNOSIS — R079 Chest pain, unspecified: Secondary | ICD-10-CM | POA: Diagnosis not present

## 2017-04-25 DIAGNOSIS — I10 Essential (primary) hypertension: Secondary | ICD-10-CM | POA: Diagnosis not present

## 2017-04-25 DIAGNOSIS — I480 Paroxysmal atrial fibrillation: Secondary | ICD-10-CM | POA: Diagnosis not present

## 2017-04-25 DIAGNOSIS — I350 Nonrheumatic aortic (valve) stenosis: Secondary | ICD-10-CM

## 2017-04-25 NOTE — Patient Instructions (Addendum)
Medication Instructions:   Your physician recommends that you continue on your current medications as directed. Please refer to the Current Medication list given to you today.   If you need a refill on your cardiac medications before your next appointment, please call your pharmacy.  Labwork: NONE ORDERED  TODAY    Testing/Procedures: Your physician has requested that you have en exercise stress myoview. For further information please visit [email or web address]. Please follow instruction sheet, as given.    Follow-Up: BASED UPON RESULTS   Any Other Special Instructions Will Be Listed Below (If Applicable).

## 2017-04-25 NOTE — Addendum Note (Signed)
Addended by: Mendel Ryder on: 04/25/2017 05:10 PM   Modules accepted: Orders

## 2017-04-26 ENCOUNTER — Ambulatory Visit (HOSPITAL_COMMUNITY): Payer: PPO | Attending: Cardiology

## 2017-04-26 DIAGNOSIS — I4891 Unspecified atrial fibrillation: Secondary | ICD-10-CM | POA: Insufficient documentation

## 2017-04-26 DIAGNOSIS — R079 Chest pain, unspecified: Secondary | ICD-10-CM | POA: Insufficient documentation

## 2017-04-26 DIAGNOSIS — I10 Essential (primary) hypertension: Secondary | ICD-10-CM | POA: Diagnosis not present

## 2017-04-26 LAB — MYOCARDIAL PERFUSION IMAGING
Estimated workload: 10.1 METS
Exercise duration (min): 8 min
LV dias vol: 111 mL (ref 62–150)
LV sys vol: 52 mL
MPHR: 154 {beats}/min
Peak HR: 136 {beats}/min
Percent HR: 88 %
RATE: 0.31
RPE: 18
Rest HR: 75 {beats}/min
SDS: 3
SRS: 3
SSS: 6
TID: 1.09

## 2017-04-26 MED ORDER — TECHNETIUM TC 99M TETROFOSMIN IV KIT
32.7000 | PACK | Freq: Once | INTRAVENOUS | Status: AC | PRN
  Administered 2017-04-26: 32.7 via INTRAVENOUS
  Filled 2017-04-26: qty 33

## 2017-04-26 MED ORDER — TECHNETIUM TC 99M TETROFOSMIN IV KIT
10.1000 | PACK | Freq: Once | INTRAVENOUS | Status: AC | PRN
  Administered 2017-04-26: 10.1 via INTRAVENOUS
  Filled 2017-04-26: qty 11

## 2017-05-01 ENCOUNTER — Other Ambulatory Visit: Payer: Self-pay

## 2017-05-01 ENCOUNTER — Ambulatory Visit (HOSPITAL_COMMUNITY)
Admission: RE | Admit: 2017-05-01 | Discharge: 2017-05-01 | Disposition: A | Discharge status: Home / Self Care | Payer: PPO | Source: Ambulatory Visit | Attending: Nurse Practitioner | Admitting: Nurse Practitioner

## 2017-05-01 ENCOUNTER — Encounter (HOSPITAL_COMMUNITY): Payer: Self-pay | Admitting: Nurse Practitioner

## 2017-05-01 VITALS — BP 112/66 | HR 70 | Ht 72.0 in | Wt 245.2 lb

## 2017-05-01 DIAGNOSIS — I481 Persistent atrial fibrillation: Secondary | ICD-10-CM | POA: Insufficient documentation

## 2017-05-01 DIAGNOSIS — I10 Essential (primary) hypertension: Secondary | ICD-10-CM | POA: Insufficient documentation

## 2017-05-01 DIAGNOSIS — E669 Obesity, unspecified: Secondary | ICD-10-CM | POA: Diagnosis not present

## 2017-05-01 DIAGNOSIS — R42 Dizziness and giddiness: Secondary | ICD-10-CM | POA: Diagnosis not present

## 2017-05-01 DIAGNOSIS — K219 Gastro-esophageal reflux disease without esophagitis: Secondary | ICD-10-CM | POA: Insufficient documentation

## 2017-05-01 DIAGNOSIS — F329 Major depressive disorder, single episode, unspecified: Secondary | ICD-10-CM | POA: Diagnosis not present

## 2017-05-01 DIAGNOSIS — Z79899 Other long term (current) drug therapy: Secondary | ICD-10-CM | POA: Insufficient documentation

## 2017-05-01 DIAGNOSIS — E785 Hyperlipidemia, unspecified: Secondary | ICD-10-CM | POA: Insufficient documentation

## 2017-05-01 DIAGNOSIS — I350 Nonrheumatic aortic (valve) stenosis: Secondary | ICD-10-CM | POA: Insufficient documentation

## 2017-05-01 DIAGNOSIS — Z7901 Long term (current) use of anticoagulants: Secondary | ICD-10-CM | POA: Diagnosis not present

## 2017-05-01 DIAGNOSIS — E1169 Type 2 diabetes mellitus with other specified complication: Secondary | ICD-10-CM | POA: Insufficient documentation

## 2017-05-01 MED ORDER — DILTIAZEM HCL ER COATED BEADS 180 MG PO CP24: 180.0000 mg | ORAL_CAPSULE | Freq: Every day | ORAL | 2 refills | Status: DC

## 2017-05-01 NOTE — Progress Notes (Signed)
Patient ID: Jeremy Tucker, male   DOB: Sep 19, 1951, 66 y.o.   MRN: [id number]     Primary Care Physician: Denita Lung, MD Referring Physician: Dr. Quentin Mulling is a 66 y.o. male with a h/o persistent afib, s/p ablation, 03/12/15, and recently had a linq monitor placed,11/1, for some ongoing palpitations. Site healed and remote report showed PAC's but no significant afib burden. He still reports that he has some weak mornings and has to go back to bed. He was encouraged to send a report when that occurs again to see if rhythm has anything to do with these spells.  He f/u's in the afib clinic 2/20 and linq is interrogated and reviewed with Tomi Bamberger, [employer] rep. It appears that the majority of his irregular heart beat is PAC's and dicussed with pt. Overall, he is now retired 6 months and he and his wife are exercising more. He still has some days that he is more tired but cannot specifically correlate with an arrhythmia. He continues on DOAC, no bleeding issues.  F/u 09/22/16, he is doing great. No afib burden mentioned. He is walking regularly. Describes a shooting pain down left arm that lasts seconds, is not exertional. Pending f/u echo for mild aortic stenosis. Continues on Eliquis 5 mg bid without bleeding issues.  F/u in afib clinic, 6/18, for episodes of head not feeling quite right at times, "lightheaded" and his whole body will ache, which he notices first thing in the am and going back to sleep for a few hours will abate the symptoms.He Does not note palpitations with this. Denies snoring. When he checks V/S, BP, HR is ok. He recently saw Cecilie Kicks, NP and she ordered a stress test which was low risk. Paceart reads out 43% afib burden but he is having bigeminal PAC's(chronic) which is being erroneously interpreted as afib, industry rep agrees. I had [employer] interrogate in the office today , noting dates in the am on 6/6, 6/5 and 6/11. There were no arrhythmia's identified. Pt  also has been sending a remote daily as he was confused how to transmit and [employer] rep informed pt of correct way to transmit. He has noted that BP is running lower than it has in the past.Continues on eliquis, no bleeding issues. Moderate AS was found to be stable on repeat echo last November.  Today, he denies symptoms of palpitations, chest pain, shortness of breath, orthopnea, PND, lower extremity edema, dizziness, presyncope, syncope, or neurologic sequela. The patient is tolerating medications without difficulties and is otherwise without complaint today.   Past Medical History:  Diagnosis Date  . Aortic stenosis, mild   . BPH (benign prostatic hyperplasia)   . Controlled diabetes mellitus type 2 with complications ([place])   . Depression   . DJD (degenerative joint disease)   . DVT (deep venous thrombosis) ([place])   . Dysrhythmia    Atrial fibrillation  . GERD (gastroesophageal reflux disease)   . Glucose intolerance (impaired glucose tolerance)   . Gout    denies  . Hyperlipidemia   . Hyperlipidemia associated with type 2 diabetes mellitus ([place])   . Hypertension    denies  . Hypertension associated with diabetes ([place])   . Kidney stones   . Obesity   . Persistent atrial fibrillation ([place])    a. CHADsVASc score at least 2   Past Surgical History:  Procedure Laterality Date  . ATRIAL FIBRILLATION ABLATION N/A 03/12/2015   Procedure: ATRIAL FIBRILLATION ABLATION;  Surgeon: [name], MD;  [facility] CATH LAB;  Service: Cardiovascular;  Laterality: N/A;  . CARDIAC CATHETERIZATION  1990's   "Dr. Melvern Banker", no blockages per pt  . CARDIOVERSION N/A 02/25/2015   Procedure: CARDIOVERSION;  Surgeon: Sueanne Margarita, MD;  Location: [facility];  Service: Cardiovascular;  Laterality: N/A;  . COLONOSCOPY  2010   [facility]  . CYSTOSCOPY W/ STONE MANIPULATION  1980's   "couldn't get stone so they had to cut me open"  . CYSTOSCOPY WITH RETROGRADE PYELOGRAM, URETEROSCOPY AND STENT PLACEMENT  Left 04/24/2015   Procedure: URETHRAL MEATAL DILATION, CYSTOSCOPY WITH LEFT RETROGRADE PYELOGRAM, LEFT URETEROSCOPY, STONE BASKET EXTRACTION,  LEFT DOUBLE J STENT PLACEMENT;  Surgeon: Carolan Clines, MD;  Location: [facility];  Service: Urology;  Laterality: Left;  . EP IMPLANTABLE DEVICE N/A 09/15/2015   Procedure: Loop Recorder Insertion;  Surgeon: [name], MD;  Location: [place] [facility] CV LAB;  Service: Cardiovascular;  Laterality: N/A;  . FOREARM FRACTURE SURGERY Right ~ 1961  . FRACTURE SURGERY    . HOLMIUM LASER APPLICATION Left 2/77/4128   Procedure: HOLMIUM LASER APPLICATION;  Surgeon: Carolan Clines, MD;  [facility];  Service: Urology;  Laterality: Left;  . JOINT REPLACEMENT    . KIDNEY STONE SURGERY  1980's   "stone lodged in the duct; had to cut me open to get it"  . KNEE ARTHROSCOPY Bilateral "multiple times"  . TEE WITHOUT CARDIOVERSION N/A 03/12/2015   Procedure: TRANSESOPHAGEAL ECHOCARDIOGRAM (TEE);  Surgeon: Pixie Casino, MD;  Location[facility] ENDOSCOPY;  Service: Cardiovascular;  Laterality: N/A;  . TONSILLECTOMY    . TOTAL KNEE ARTHROPLASTY Left 2001   [facility]    Current Outpatient Prescriptions  Medication Sig Dispense Refill  . acetaminophen (TYLENOL) 500 MG tablet Take 500-1,000 mg by mouth every 6 (six) hours as needed for headache.    . albuterol (PROVENTIL HFA;VENTOLIN HFA) 108 (90 BASE) MCG/ACT inhaler Inhale 2 puffs into the lungs every 6 (six) hours as needed for wheezing or shortness of breath.    . allopurinol (ZYLOPRIM) 300 MG tablet Take 300 mg by mouth daily.      Marland Kitchen ammonium lactate (LAC-HYDRIN) 12 % lotion Apply 1 application topically daily as needed for dry skin.    Marland Kitchen apixaban (ELIQUIS) 5 MG TABS tablet Take 1 tablet (5 mg total) by mouth 2 (two) times daily. 60 tablet 11  . atorvastatin (LIPITOR) 20 MG tablet TAKE 1 TABLET BY MOUTH EVERY DAY 90 tablet 0  . buPROPion (WELLBUTRIN XL) 300 MG 24 hr tablet TAKE 1 TABLET BY MOUTH DAILY (Patient taking  differently: take one half tablet daily) 30 tablet 0  . busPIRone (BUSPAR) 10 MG tablet Take 5 mg by mouth 2 (two) times daily.  2  . diltiazem (CARDIZEM CD) 180 MG 24 hr capsule Take 1 capsule (180 mg total) by mouth daily. 180 capsule 2  . loratadine (CLARITIN) 10 MG tablet Take 10 mg by mouth daily.    Marland Kitchen LORazepam (ATIVAN) 0.5 MG tablet TAKE 1 TABLET TWICE DAILY AS NEEDED FOR ANXIETY. 20 tablet 0  . metoprolol succinate (TOPROL-XL) 25 MG 24 hr tablet TAKE 1 TABLET BY MOUTH DAILY 30 tablet 6  . mometasone (ASMANEX) 220 MCG/INH inhaler Inhale 1 puff into the lungs 2 (two) times daily. Reported on 03/21/2016 (Patient taking differently: Inhale 1 puff into the lungs as needed. Reported on 03/21/2016) 1 Inhaler 5  . Multiple Vitamins-Minerals (MULTIVITAMIN WITH MINERALS) tablet Take 1 tablet by mouth daily.      Marland Kitchen  Omega-3 Fatty Acids (FISH OIL TRIPLE STRENGTH) 1400 MG CAPS Take by mouth.    Marland Kitchen omeprazole (PRILOSEC OTC) 20 MG tablet Take 20 mg by mouth daily.    . Potassium Citrate 15 MEQ (1620 MG) TBCR Take 15 mEq by mouth daily.  11  . silodosin (RAPAFLO) 4 MG CAPS capsule Take 4 mg by mouth at bedtime.     Marland Kitchen zolpidem (AMBIEN) 5 MG tablet Take 1 tablet (5 mg total) by mouth at bedtime as needed for sleep. 30 tablet 0   No current facility-administered medications for this encounter.     Allergies  Allergen Reactions  . Codeine Itching  . Oxycodone Itching    Social History   Social History  . Marital status: Married    Spouse name: N/A  . Number of children: N/A  . Years of education: N/A   Occupational History  . Not on file.   Social History Main Topics  . Smoking status: Never Smoker  . Smokeless tobacco: Never Used  . Alcohol use Yes     Comment: 01/21/2015 "maybe 4-5 drinks/yr"  . Drug use: No  . Sexual activity: Yes   Other Topics Concern  . Not on file   Social History Narrative   Pt lives in [place] with spouse.  80 yo son is health.  Second son died in car accident.        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 Heart attack Mother 41       MI  . Heart attack Father 44       ? MI versus trauma to head  . Healthy Brother     ROS- All systems are reviewed and negative except as per the HPI above  Physical Exam: Vitals:   05/01/17 0851  BP: 112/66  Pulse: 70  Weight: 245 lb 3.2 oz (111.2 kg)  Height: 6' (1.829 m)    GEN- The patient is well appearing, alert and oriented x 3 today.   Head- normocephalic, atraumatic Eyes-  Sclera clear, conjunctiva pink Ears- hearing intact Oropharynx- clear Neck- supple, no JVP Lymph- no cervical lymphadenopathy Lungs- Clear to ausculation bilaterally, normal work of breathing Heart- slightly irregularegular rate and rhythm, 2/6  murmurs, rubs or gallops, PMI not laterally displaced GI- soft, NT, ND, + BS Extremities- no clubbing, cyanosis, or edema MS- no significant deformity or atrophy Skin- no rash or lesion Psych- euthymic mood, full affect Neuro- strength and sensation are intact  EKG- NSR, rate 70 bpm, pr int 176 ms, qrs int 90 ms, qtc 419 ms. Epic records reviewed.  Assessment and Plan: 1. Afib In SR, not aware of palpitations at this point, does have PAC's but appear asymptomatic Linq in place Had interrogated this am and with specific dates that pt had symptoms of lightheadedness/body aches, no arrhythmias found Continue with remote transmissions, pt was educated how transmissions  are sent and not to send manually everyday Continue eliquis, chadsvasc of 2 Continue Cardizem but decrease to 180 mg at hs and stop am dose, in case hypotension may be causing symptoms  2. Aortic stenosis Followed by Dr. Tamala Julian Echo stable with mod AS as of last November  If symptoms still persistent after Cardizem is reduced, I suggested f/u with PCP to review other meds or  Medical cause for symptoms, I do not believe this is a rhythm issue  F/u in afib  clinic as needed Remote linq check  6/25 Dr. Tamala Julian as scheduled 08/2017  Geroge Baseman. [place], [facility] [address] [place], [place] [address] [phone]

## 2017-05-08 ENCOUNTER — Ambulatory Visit (INDEPENDENT_AMBULATORY_CARE_PROVIDER_SITE_OTHER): Payer: PPO | Admitting: *Deleted

## 2017-05-08 DIAGNOSIS — I480 Paroxysmal atrial fibrillation: Secondary | ICD-10-CM | POA: Diagnosis not present

## 2017-05-08 NOTE — Progress Notes (Signed)
Carelink Summary Report / Loop Recorder 

## 2017-05-17 LAB — CUP PACEART REMOTE DEVICE CHECK
Date Time Interrogation Session: 20180624184005
Implantable Pulse Generator Implant Date: 20161101

## 2017-05-17 NOTE — Progress Notes (Signed)
Carelink summary report received. Battery status OK. Normal device function. No new symptom episodes, tachy episodes, brady, or pause episodes. 192 AF 30.3% +eliquis. Monthly summary reports and ROV/PRN

## 2017-05-18 ENCOUNTER — Telehealth: Payer: Self-pay | Admitting: Cardiology

## 2017-05-18 NOTE — Telephone Encounter (Signed)
Spoke w/ pt and requested that he send a manual transmission b/c his home monitor has not updated in at least 14 days.   

## 2017-05-23 ENCOUNTER — Other Ambulatory Visit: Payer: Self-pay | Admitting: Interventional Cardiology

## 2017-05-30 ENCOUNTER — Telehealth: Payer: Self-pay | Admitting: Pharmacist

## 2017-05-30 NOTE — Patient Outreach (Signed)
[name]  05/30/2017  Jeremy Tucker [birth year] [id number]  Patient was called regarding medication assistance per referral from [name]. HIPAA identifiers were obtained. Patient asked me to call him back tomorrow afternoon as he was in [place], [place] and unable to speak to me.   Plan:  1.  Call patient back 2 . Request out of pocket spend from [name], PharmD, [name] Pharmacist [phone]

## 2017-05-31 ENCOUNTER — Other Ambulatory Visit: Payer: Self-pay | Admitting: Pharmacist

## 2017-05-31 NOTE — Patient Outreach (Signed)
[facility]) [place]   05/31/2017  Jeremy Tucker Jan 17, 1951 [id number]  Subjective: Patient was called regarding medication assistance per referral from [facility] [employer].  HIPAA identifiers were obtained.  Patient is a 66 year old male with multiple medical conditions including but not limited to:  Aortic stenosis, asthma, BPH, type 2 diabetes, ED, GERD, arthritis, chronic renal stones, hypertension, hyperlipidemia, OSA, a fib, and obesity.  Patient reported managing his medications on his own without difficulty.  He did express concern over the cost of Eliquis which he stated is costing him >$140 per fill.       Objective:   Medications Reviewed Today    Reviewed by Elayne Guerin, [facility] (Pharmacist) on 05/31/17 at 1408  Med List Status: <None>  Medication Order Taking? Sig Documenting Provider Last Dose Status Informant  acetaminophen (TYLENOL) 500 MG tablet [id number] Yes Take 500-1,000 mg by mouth every 6 (six) hours as needed for headache. [provider] Taking Active Self  albuterol (PROVENTIL HFA;VENTOLIN HFA) 108 (90 BASE) MCG/ACT inhaler [id number] Yes Inhale 2 puffs into the lungs every 6 (six) hours as needed for wheezing or shortness of breath. [provider] Taking Active Self  allopurinol (ZYLOPRIM) 300 MG tablet [id number] Yes Take 300 mg by mouth daily.   [provider] Taking Active Self  ammonium lactate (LAC-HYDRIN) 12 % lotion [id number] Yes Apply 1 application topically daily as needed for dry skin. [provider] Taking Active Self  apixaban (ELIQUIS) 5 MG TABS tablet [id number] Yes Take 1 tablet (5 mg total) by mouth 2 (two) times daily. Belva Crome, MD Taking Active   atorvastatin (LIPITOR) 20 MG tablet [id number] Yes TAKE 1 TABLET BY MOUTH EVERY DAY Rita Ohara, MD Taking Active   buPROPion (WELLBUTRIN XL) 300 MG 24 hr tablet [id number] Yes TAKE 1 TABLET BY MOUTH DAILY  Denita Lung, MD Taking Active   busPIRone (BUSPAR) 10 MG tablet [id number] Yes Take 5 mg by mouth 2 (two) times daily. [provider] Taking Active            Med Note Landry Mellow, Bernerd Pho S   Tue Apr 25, 2017 10:59 AM)    diltiazem (CARDIZEM CD) 180 MG 24 hr capsule [id number] Yes Take 1 capsule (180 mg total) by mouth daily. Sherran Needs, NP Taking Active         Discontinued 37/34/28 7681 (Duplicate)   loratadine (CLARITIN) 10 MG tablet [id number] Yes Take 10 mg by mouth daily. [provider] Taking Active Self  LORazepam (ATIVAN) 0.5 MG tablet [id number] Yes TAKE 1 TABLET TWICE DAILY AS NEEDED FOR ANXIETY. Denita Lung, MD Taking Active   metoprolol succinate (TOPROL-XL) 25 MG 24 hr tablet [id number] Yes TAKE 1 TABLET BY MOUTH DAILY Sherran Needs, NP Taking Active   mometasone [facility]) 220 MCG/INH inhaler [id number] Yes Inhale 1 puff into the lungs 2 (two) times daily. Reported on 03/21/2016  Patient taking differently:  Inhale 1 puff into the lungs as needed. Reported on 03/21/2016   Denita Lung, MD Taking Active   Multiple Vitamins-Minerals (MULTIVITAMIN WITH MINERALS) tablet [id number] Yes Take 1 tablet by mouth daily.   [provider] Taking Active Self  Omega-3 Fatty Acids (FISH OIL TRIPLE STRENGTH) 1400 MG CAPS [id number] Yes Take by mouth. [provider] Taking Active   omeprazole (PRILOSEC OTC) 20 MG tablet [id number] Yes Take 20 mg by mouth daily. [provider] Taking Active  Self  Potassium Citrate 15 MEQ (1620 MG) TBCR [id number] Yes Take 15 mEq by mouth daily. [provider] Taking Active            Med Note Landry Mellow, Bernerd Pho S   Tue Apr 25, 2017 10:59 AM)    silodosin (RAPAFLO) 4 MG CAPS capsule [id number] No Take 4 mg by mouth at bedtime.  [provider] Not Taking Active Self        Discontinued 05/31/17 1346 (Change in therapy)   tamsulosin (FLOMAX) 0.4 MG CAPS capsule [id number] Yes Take 0.4 mg by mouth at bedtime.  [provider] Taking Active Self  zolpidem (AMBIEN) 5 MG tablet [id number] Yes Take 1 tablet (5 mg total) by mouth at bedtime as needed for sleep. Denita Lung, MD Taking Active            Functional Status: No flowsheet data found.  Fall/Depression Screening: No flowsheet data found. No flowsheet data found.    Assessment: Patient's medications were reviewed via telephone.   Drugs sorted by system:  Neurologic/Psychologic: Lorazepam Bupropion Buspirone Zolpidem  Cardiovascular: Eliquis Atorvastatin Diltiazem Metoprolol Omega 3 Fatty Acids  Pulmonary/Allergy:  Asmanex Albuterol inhaler  Gastrointestinal: Omeprazole  Endocrine:  Renal: Allopurinol  Genitourinary Tamsulosin  Topical: Lac-Hydrin  Pain: Acetaminophen  Vitamins/Minerals: Potassium Citrate Multivitamin Infectious Diseases:  Medication Review Findings -Patient is over income for "Extra Help Program" offered through [place] -Patient appears to meet the financial requirement to receive Eliquis from [employer] but has NOT met the 3% out of pocket medication expenditure requirement. -According to [facility] patient has spent $664.96 out of pocket on medications -Patient appears to meet the financial requirements to receive Proventil and Asmanex from [employer]'s Patient Assistance Program   Patient is aware he will need to sign applications and provide proof of income.   Plan:  1. Route note to PCP 2. Refer patient to Doreene Burke, CPhT to send applications to patient

## 2017-06-06 ENCOUNTER — Ambulatory Visit (INDEPENDENT_AMBULATORY_CARE_PROVIDER_SITE_OTHER): Payer: PPO | Admitting: *Deleted

## 2017-06-06 DIAGNOSIS — I480 Paroxysmal atrial fibrillation: Secondary | ICD-10-CM

## 2017-06-07 NOTE — Progress Notes (Signed)
Carelink Summary Report / Loop Recorder 

## 2017-06-22 LAB — CUP PACEART REMOTE DEVICE CHECK
Date Time Interrogation Session: 20180724203843
Implantable Pulse Generator Implant Date: 20161101

## 2017-06-26 ENCOUNTER — Other Ambulatory Visit: Payer: Self-pay | Admitting: Family Medicine

## 2017-06-26 ENCOUNTER — Telehealth: Payer: Self-pay | Admitting: Interventional Cardiology

## 2017-06-26 NOTE — Telephone Encounter (Signed)
Is this okay to refill? 

## 2017-06-26 NOTE — Telephone Encounter (Signed)
Spoke with patient and he stated that he has hit the donut hole and eliquis is going to cost him $175/month. I have made him aware that I could place two weeks of samples at the front desk and also that this is not something that we can supply him with for the remainder of the year. Patient verbalized his understanding and was appreciative for samples provided.

## 2017-06-26 NOTE — Telephone Encounter (Signed)
New message ° ° °Patient calling the office for samples of medication: ° ° °1.  What medication and dosage are you requesting samples for?apixaban (ELIQUIS) 5 MG TABS tablet ° °2.  Are you currently out of this medication? YES ° ° °

## 2017-06-27 ENCOUNTER — Telehealth: Payer: Self-pay

## 2017-06-27 ENCOUNTER — Other Ambulatory Visit: Payer: Self-pay | Admitting: Family Medicine

## 2017-06-27 ENCOUNTER — Other Ambulatory Visit: Payer: Self-pay | Admitting: Internal Medicine

## 2017-06-27 NOTE — Telephone Encounter (Signed)
Dr.Lalonde I dont have an answer for him please advise

## 2017-06-27 NOTE — Telephone Encounter (Signed)
I have left message to make appointment

## 2017-06-27 NOTE — Telephone Encounter (Signed)
Pt wants to know why Buspar refill is refused. Please call him at home number.

## 2017-06-27 NOTE — Telephone Encounter (Signed)
Dr.Lalonde I called pt to inform him you were out of town and I looked to see when the last refill of the Buspar was filled we talked and he found out that Dr.Allred was the prescribing Dr.  [name] apologized and said he would call that Dr.

## 2017-06-27 NOTE — Telephone Encounter (Signed)
Make sure that he is scheduled for an appt for diabetes and his Buspar

## 2017-06-27 NOTE — Telephone Encounter (Signed)
Called pt to let him know he needs an appointment for his diabetes check  Left VM  On cell

## 2017-07-06 ENCOUNTER — Ambulatory Visit (INDEPENDENT_AMBULATORY_CARE_PROVIDER_SITE_OTHER): Payer: PPO | Admitting: *Deleted

## 2017-07-06 DIAGNOSIS — I480 Paroxysmal atrial fibrillation: Secondary | ICD-10-CM | POA: Diagnosis not present

## 2017-07-07 NOTE — Progress Notes (Signed)
Carelink Summary Report / Loop Recorder 

## 2017-07-11 LAB — CUP PACEART REMOTE DEVICE CHECK
Date Time Interrogation Session: 20180823213720
Implantable Pulse Generator Implant Date: 20161101

## 2017-07-11 NOTE — Progress Notes (Signed)
Carelink summary report received. Battery status OK. Normal device function. No new symptom episodes, tachy episodes, brady, or pause episodes338 AF- 63.5% +Eliquis. Monthly summary reports and ROV/PRN

## 2017-07-14 ENCOUNTER — Other Ambulatory Visit: Payer: Self-pay | Admitting: Pharmacy Technician

## 2017-07-14 NOTE — Patient Outreach (Signed)
[facility]) Care Management  07/14/2017  Jeremy Tucker May 20, 1951 [id number]   I contacted this patient today in reference to patient assistance application's that I mailed to him. The patient states he spoke to [place], [place] and was informed that he had not spent enough out of pocket to be eligible at this time. He has had several prescription's filled recently and he's going to get a printout from his pharmacy to check his current out of pocket cost. The patient states he will mail everything to me once cost is met.  Doreene Burke, [place] [phone]

## 2017-07-16 ENCOUNTER — Other Ambulatory Visit (HOSPITAL_COMMUNITY): Payer: Self-pay | Admitting: Nurse Practitioner

## 2017-07-17 ENCOUNTER — Other Ambulatory Visit (HOSPITAL_COMMUNITY): Payer: Self-pay | Admitting: Nurse Practitioner

## 2017-08-02 ENCOUNTER — Telehealth: Payer: Self-pay | Admitting: Cardiology

## 2017-08-02 NOTE — Telephone Encounter (Signed)
Spoke w/ pt and requested that he send a manual transmission b/c his home monitor has not updated in at least 14 days.   

## 2017-08-07 ENCOUNTER — Ambulatory Visit (INDEPENDENT_AMBULATORY_CARE_PROVIDER_SITE_OTHER): Payer: PPO | Admitting: *Deleted

## 2017-08-07 DIAGNOSIS — I480 Paroxysmal atrial fibrillation: Secondary | ICD-10-CM

## 2017-08-08 LAB — CUP PACEART REMOTE DEVICE CHECK
Date Time Interrogation Session: 20180922221207
Implantable Pulse Generator Implant Date: 20161101

## 2017-08-08 NOTE — Progress Notes (Signed)
Carelink Summary Report / Loop Recorder 

## 2017-08-09 ENCOUNTER — Encounter: Payer: Self-pay | Admitting: Medical

## 2017-08-09 ENCOUNTER — Ambulatory Visit (INDEPENDENT_AMBULATORY_CARE_PROVIDER_SITE_OTHER): Payer: PPO | Admitting: Medical

## 2017-08-09 ENCOUNTER — Telehealth: Payer: Self-pay | Admitting: Family Medicine

## 2017-08-09 ENCOUNTER — Other Ambulatory Visit: Payer: Self-pay | Admitting: Medical

## 2017-08-09 VITALS — BP 126/80 | HR 72 | Wt 239.0 lb

## 2017-08-09 DIAGNOSIS — R062 Wheezing: Secondary | ICD-10-CM

## 2017-08-09 DIAGNOSIS — E1159 Type 2 diabetes mellitus with other circulatory complications: Secondary | ICD-10-CM

## 2017-08-09 DIAGNOSIS — I152 Hypertension secondary to endocrine disorders: Secondary | ICD-10-CM

## 2017-08-09 DIAGNOSIS — I519 Heart disease, unspecified: Secondary | ICD-10-CM | POA: Diagnosis not present

## 2017-08-09 DIAGNOSIS — I10 Essential (primary) hypertension: Secondary | ICD-10-CM | POA: Diagnosis not present

## 2017-08-09 DIAGNOSIS — I5189 Other ill-defined heart diseases: Secondary | ICD-10-CM

## 2017-08-09 DIAGNOSIS — E118 Type 2 diabetes mellitus with unspecified complications: Secondary | ICD-10-CM

## 2017-08-09 DIAGNOSIS — J4531 Mild persistent asthma with (acute) exacerbation: Secondary | ICD-10-CM | POA: Diagnosis not present

## 2017-08-09 MED ORDER — AZITHROMYCIN 250 MG PO TABS: ORAL_TABLET | ORAL | 0 refills | Status: DC

## 2017-08-09 MED ORDER — BUPROPION HCL ER (XL) 300 MG PO TB24: 300.0000 mg | ORAL_TABLET | Freq: Every day | ORAL | 1 refills | Status: DC

## 2017-08-09 MED ORDER — ALBUTEROL SULFATE HFA 108 (90 BASE) MCG/ACT IN AERS: 2.0000 | INHALATION_SPRAY | Freq: Four times a day (QID) | RESPIRATORY_TRACT | 0 refills | Status: DC | PRN

## 2017-08-09 MED ORDER — BUDESONIDE-FORMOTEROL FUMARATE 160-4.5 MCG/ACT IN AERO: 2.0000 | INHALATION_SPRAY | Freq: Two times a day (BID) | RESPIRATORY_TRACT | 0 refills | Status: DC

## 2017-08-09 MED ORDER — BUPROPION HCL ER (XL) 300 MG PO TB24: 300.0000 mg | ORAL_TABLET | Freq: Every day | ORAL | 0 refills | Status: DC

## 2017-08-09 NOTE — Progress Notes (Addendum)
Subjective:  Jeremy Tucker is a 66 y.o. male who presents for possible bronchitis.   Has hx/o bronchitis.   Started 5 days ago with cough, nausea, has some head pressure, sinus pressure, fatigued, no energy, getting some colored sputum.    No vomiting, no diarrhea, no fever, no sore throat, no ear pain.   Using asmanex BID, albuterol TID.   In the past has used zpak, but last few times was given amoxicillin.  Headed to see grandchildren in [place] next week.   zpak usually works quicker for him.   denies sick contacts.   Has been on airplane 2 weeks ago.  Patient is not a smoker. No other aggravating or relieving factors.  No other c/o.  The following portions of the patient's history were reviewed and updated as appropriate: allergies, current medications, past family history, past medical history, past social history, past surgical history and problem list.  ROS as in subjective  Past Medical History:  Diagnosis Date  . Aortic stenosis, mild   . BPH (benign prostatic hyperplasia)   . Controlled diabetes mellitus type 2 with complications ([place])   . Depression   . DJD (degenerative joint disease)   . DVT (deep venous thrombosis) ([place])   . Dysrhythmia    Atrial fibrillation  . GERD (gastroesophageal reflux disease)   . Glucose intolerance (impaired glucose tolerance)   . Gout    denies  . Hyperlipidemia   . Hyperlipidemia associated with type 2 diabetes mellitus ([place])   . Hypertension    denies  . Hypertension associated with diabetes ([place])   . Kidney stones   . Obesity   . Persistent atrial fibrillation ([place])    a. CHADsVASc score at least 2   Current Outpatient Prescriptions on File Prior to Visit  Medication Sig Dispense Refill  . acetaminophen (TYLENOL) 500 MG tablet Take 500-1,000 mg by mouth every 6 (six) hours as needed for headache.    . allopurinol (ZYLOPRIM) 300 MG tablet Take 300 mg by mouth daily.      Marland Kitchen ammonium lactate (LAC-HYDRIN) 12 % lotion Apply 1 application  topically daily as needed for dry skin.    Marland Kitchen apixaban (ELIQUIS) 5 MG TABS tablet Take 1 tablet (5 mg total) by mouth 2 (two) times daily. 60 tablet 11  . atorvastatin (LIPITOR) 20 MG tablet TAKE 1 TABLET BY MOUTH EVERY DAY 90 tablet 0  . busPIRone (BUSPAR) 10 MG tablet Take 0.5 tablets (5 mg total) by mouth 2 (two) times daily. 90 tablet 2  . diltiazem (CARDIZEM CD) 180 MG 24 hr capsule Take 1 capsule (180 mg total) by mouth daily. 180 capsule 2  . loratadine (CLARITIN) 10 MG tablet Take 10 mg by mouth daily.    Marland Kitchen LORazepam (ATIVAN) 0.5 MG tablet TAKE 1 TABLET TWICE DAILY AS NEEDED FOR ANXIETY. 20 tablet 0  . metoprolol succinate (TOPROL-XL) 25 MG 24 hr tablet TAKE 1 TABLET BY MOUTH DAILY 90 tablet 2  . mometasone (ASMANEX) 220 MCG/INH inhaler Inhale 1 puff into the lungs 2 (two) times daily. Reported on 03/21/2016 (Patient taking differently: Inhale 1 puff into the lungs as needed. Reported on 03/21/2016) 1 Inhaler 5  . Multiple Vitamins-Minerals (MULTIVITAMIN WITH MINERALS) tablet Take 1 tablet by mouth daily.      . Omega-3 Fatty Acids (FISH OIL TRIPLE STRENGTH) 1400 MG CAPS Take by mouth.    Marland Kitchen omeprazole (PRILOSEC OTC) 20 MG tablet Take 20 mg by mouth daily.    Marland Kitchen  Potassium Citrate 15 MEQ (1620 MG) TBCR Take 15 mEq by mouth daily.  11  . tamsulosin (FLOMAX) 0.4 MG CAPS capsule Take 0.4 mg by mouth at bedtime.    Marland Kitchen zolpidem (AMBIEN) 5 MG tablet Take 1 tablet (5 mg total) by mouth at bedtime as needed for sleep. 30 tablet 0   No current facility-administered medications on file prior to visit.    ROS as in subjective   Objective: BP 126/80   Pulse 72   Wt 239 lb (108.4 kg)   SpO2 96%   BMI 32.41 kg/m    Wt Readings from Last 3 Encounters:  08/09/17 239 lb (108.4 kg)  05/01/17 245 lb 3.2 oz (111.2 kg)  04/26/17 242 lb (109.8 kg)    General appearance: Alert, WD/WN, no distress,somewhat ill appearing                             Skin: warm, no rash, no diaphoresis                            Head: + sinus tenderness                            Eyes: conjunctiva normal, corneas clear, PERRLA                            Ears: pearly TMs, external ear canals normal                          Nose: septum midline, turbinates swollen, with erythema and mucoid discharge             Mouth/throat: MMM, tongue normal, mild pharyngeal erythema                           Neck: supple, no adenopathy, no thyromegaly, nontender                          Heart: RRR, normal S1, S2, no murmurs                         Lungs: +bronchial breath sounds, +scattered rhonchi, + wheezes, no rales                Extremities: no edema, nontender      Assessment: Encounter Diagnoses  Name Primary?  . Mild persistent asthma with acute exacerbation Yes  . Hypertension associated with diabetes ([place])   . Controlled type 2 diabetes mellitus with complication, without long-term current use of insulin ([place])   . Diastolic dysfunction-grade 15 Sep 2014   . Wheezing      Plan:  Discussed his symptoms, concerns, exam findings   Gave 1 round of albuterol by nebulizer.  Lungs improved in sound and he reported improvement.   Begin zpak, rest, hydrate well, short term add Symbicort instead of Asmanex, but once resolved hopefully in a week or 2, he can switch back to asmanex.   C/t albuterol prn.   If worse or not improving in the coming days, then recheck.  Refilled his Wellbutrin at his request.   Of note, he is going out of town next week.   Cace was  seen today for congestion.  Diagnoses and all orders for this visit:  Mild persistent asthma with acute exacerbation  Hypertension associated with diabetes ([place])  Controlled type 2 diabetes mellitus with complication, without long-term current use of insulin (HCC)  Diastolic dysfunction-grade 15 Sep 2014  Wheezing  Other orders -     azithromycin (ZITHROMAX) 250 MG tablet; 2 tablets day 1, then 1 tablet days 2-4 -     Discontinue: buPROPion (WELLBUTRIN XL)  300 MG 24 hr tablet; Take 1 tablet (300 mg total) by mouth daily. -     albuterol (PROVENTIL HFA;VENTOLIN HFA) 108 (90 Base) MCG/ACT inhaler; Inhale 2 puffs into the lungs every 6 (six) hours as needed for wheezing or shortness of breath. -     budesonide-formoterol (SYMBICORT) 160-4.5 MCG/ACT inhaler; Inhale 2 puffs into the lungs 2 (two) times daily.

## 2017-08-09 NOTE — Telephone Encounter (Signed)
done

## 2017-08-09 NOTE — Telephone Encounter (Signed)
Rcvd note from [facility] stating pt would like to get a 90 day supply of Bupropion XL 300 MG. Pharmacy wants to know if Audelia Acton wants to change script.

## 2017-08-14 ENCOUNTER — Ambulatory Visit: Payer: PPO | Admitting: Medical

## 2017-08-14 ENCOUNTER — Encounter: Payer: Self-pay | Admitting: Family Medicine

## 2017-08-14 ENCOUNTER — Ambulatory Visit (INDEPENDENT_AMBULATORY_CARE_PROVIDER_SITE_OTHER): Payer: PPO | Admitting: Family Medicine

## 2017-08-14 VITALS — BP 110/60 | HR 86 | Wt 236.0 lb

## 2017-08-14 DIAGNOSIS — I480 Paroxysmal atrial fibrillation: Secondary | ICD-10-CM | POA: Diagnosis not present

## 2017-08-14 DIAGNOSIS — J4521 Mild intermittent asthma with (acute) exacerbation: Secondary | ICD-10-CM | POA: Diagnosis not present

## 2017-08-14 NOTE — Progress Notes (Signed)
   Subjective:    Patient ID: Jeremy Tucker, male    DOB: 1951/04/15, 66 y.o.   MRN: [id number]  HPI He is here for a recheck on his asthmatic bronchitis. He was placed on a Z-Pak and also given Symbicort. He was told to stop his Asmanex which he did. He has been using albuterol on an as-needed basis. He states that he is 75% better. He finishes last dose of azithromycin yesterday.   Review of Systems     Objective:   Physical Exam Alert and in no distress. Tympanic membranes and canals are normal. Pharyngeal area is normal. Neck is supple without adenopathy or thyromegaly. Cardiac exam shows an irregular sinus rhythm without murmurs or gallops. Lungs show minimal end expiratory wheezing        Assessment & Plan:  Mild intermittent asthmatic bronchitis with acute exacerbation  Paroxysmal atrial fibrillation (HCC) I explained that at this point being 75% better is excellent. Recommend no further intervention and explained that azithromycin is still having a positive effect. He will call me Wednesday. Explained that if long as he keeps getting better, no further intervention needed.

## 2017-08-16 ENCOUNTER — Telehealth: Payer: Self-pay | Admitting: Family Medicine

## 2017-08-16 NOTE — Telephone Encounter (Signed)
Pt called and states that you told him to give you a call he would like you to give him a call  At [phone] regarding his bronchitis

## 2017-08-21 ENCOUNTER — Encounter: Payer: Self-pay | Admitting: Interventional Cardiology

## 2017-08-31 ENCOUNTER — Encounter: Payer: Self-pay | Admitting: Interventional Cardiology

## 2017-08-31 ENCOUNTER — Ambulatory Visit (INDEPENDENT_AMBULATORY_CARE_PROVIDER_SITE_OTHER): Payer: PPO | Admitting: Interventional Cardiology

## 2017-08-31 VITALS — BP 112/68 | HR 76 | Ht 72.0 in | Wt 243.4 lb

## 2017-08-31 DIAGNOSIS — I10 Essential (primary) hypertension: Secondary | ICD-10-CM | POA: Diagnosis not present

## 2017-08-31 DIAGNOSIS — I480 Paroxysmal atrial fibrillation: Secondary | ICD-10-CM | POA: Diagnosis not present

## 2017-08-31 DIAGNOSIS — I350 Nonrheumatic aortic (valve) stenosis: Secondary | ICD-10-CM | POA: Diagnosis not present

## 2017-08-31 DIAGNOSIS — E785 Hyperlipidemia, unspecified: Secondary | ICD-10-CM | POA: Diagnosis not present

## 2017-08-31 DIAGNOSIS — Z7901 Long term (current) use of anticoagulants: Secondary | ICD-10-CM | POA: Insufficient documentation

## 2017-08-31 DIAGNOSIS — G4733 Obstructive sleep apnea (adult) (pediatric): Secondary | ICD-10-CM

## 2017-08-31 DIAGNOSIS — E1169 Type 2 diabetes mellitus with other specified complication: Secondary | ICD-10-CM

## 2017-08-31 NOTE — Progress Notes (Signed)
Cardiology Office Note    Date:  08/31/2017   ID:  Jeremy Tucker, DOB [birth year], MRN [id number]  PCP:  Denita Lung, MD  Cardiologist: [name], MD   Chief Complaint  Patient presents with  . Coronary Artery Disease  . Cardiac Valve Problem    History of Present Illness:  Jeremy Tucker is a 66 y.o. male male who presents for PAF with hx of ablation 02/2015, HTN, mild aortic stenosis, GERD, HLD and anxiety disorder, chronic anticoagulation with Eliquis.  Has occasional fleeting chest discomfort. Saw Lajuana Matte earlier this should because of nonspecific chest pain and had a myocardial perfusion study that was low risk.  He has not had syncope. He denies palpitations. There is no orthopnea or PND. He denies anginal quality chest pain.   Past Medical History:  Diagnosis Date  . Aortic stenosis, mild   . BPH (benign prostatic hyperplasia)   . Controlled diabetes mellitus type 2 with complications ([place])   . Depression   . DJD (degenerative joint disease)   . DVT (deep venous thrombosis) ([place])   . Dysrhythmia    Atrial fibrillation  . GERD (gastroesophageal reflux disease)   . Glucose intolerance (impaired glucose tolerance)   . Gout    denies  . Hyperlipidemia   . Hyperlipidemia associated with type 2 diabetes mellitus ([place])   . Hypertension    denies  . Hypertension associated with diabetes ([place])   . Kidney stones   . Obesity   . Persistent atrial fibrillation ([place])    a. CHADsVASc score at least 2    Past Surgical History:  Procedure Laterality Date  . ATRIAL FIBRILLATION ABLATION N/A 03/12/2015   Procedure: ATRIAL FIBRILLATION ABLATION;  Surgeon: [name], MD;  Location: [facility] CATH LAB;  Service: Cardiovascular;  Laterality: N/A;  . CARDIAC CATHETERIZATION  1990's   "Dr. Melvern Banker", no blockages per pt  . CARDIOVERSION N/A 02/25/2015   Procedure: CARDIOVERSION;  Surgeon: Sueanne Margarita, MD;  Location: [facility];  Service: Cardiovascular;  Laterality:  N/A;  . COLONOSCOPY  2010   [facility]  . CYSTOSCOPY W/ STONE MANIPULATION  1980's   "couldn't get stone so they had to cut me open"  . CYSTOSCOPY WITH RETROGRADE PYELOGRAM, URETEROSCOPY AND STENT PLACEMENT Left 04/24/2015   Procedure: URETHRAL MEATAL DILATION, CYSTOSCOPY WITH LEFT RETROGRADE PYELOGRAM, LEFT URETEROSCOPY, STONE BASKET EXTRACTION,  LEFT DOUBLE J STENT PLACEMENT;  Surgeon: Carolan Clines, MD;  Location: [facility] ORS;  Service: Urology;  Laterality: Left;  . EP IMPLANTABLE DEVICE N/A 09/15/2015   Procedure: Loop Recorder Insertion;  Surgeon: [name], MD;  Location: [facility] CV LAB;  Service: Cardiovascular;  Laterality: N/A;  . FOREARM FRACTURE SURGERY Right ~ 1961  . FRACTURE SURGERY    . HOLMIUM LASER APPLICATION Left 1/54/0086   Procedure: HOLMIUM LASER APPLICATION;  Surgeon: Carolan Clines, MD;  Location: [facility] ORS;  Service: Urology;  Laterality: Left;  . JOINT REPLACEMENT    . KIDNEY STONE SURGERY  1980's   "stone lodged in the duct; had to cut me open to get it"  . KNEE ARTHROSCOPY Bilateral "multiple times"  . TEE WITHOUT CARDIOVERSION N/A 03/12/2015   Procedure: TRANSESOPHAGEAL ECHOCARDIOGRAM (TEE);  Surgeon: Pixie Casino, MD;  Location: [facility] ENDOSCOPY;  Service: Cardiovascular;  Laterality: N/A;  . TONSILLECTOMY    . TOTAL KNEE ARTHROPLASTY Left 2001   [facility]    Current Medications: Outpatient Medications Prior to Visit  Medication Sig Dispense Refill  . acetaminophen (  TYLENOL) 500 MG tablet Take 500-1,000 mg by mouth every 6 (six) hours as needed for headache.    . albuterol (PROVENTIL HFA;VENTOLIN HFA) 108 (90 Base) MCG/ACT inhaler Inhale 2 puffs into the lungs every 6 (six) hours as needed for wheezing or shortness of breath. 1 Inhaler 0  . allopurinol (ZYLOPRIM) 300 MG tablet Take 300 mg by mouth daily.      Marland Kitchen ammonium lactate (LAC-HYDRIN) 12 % lotion Apply 1 application topically daily as needed for dry skin.    Marland Kitchen apixaban (ELIQUIS) 5 MG TABS tablet Take 1  tablet (5 mg total) by mouth 2 (two) times daily. 60 tablet 11  . atorvastatin (LIPITOR) 20 MG tablet TAKE 1 TABLET BY MOUTH EVERY DAY 90 tablet 0  . buPROPion (WELLBUTRIN XL) 300 MG 24 hr tablet Take 1 tablet (300 mg total) by mouth daily. 90 tablet 0  . diltiazem (CARDIZEM CD) 180 MG 24 hr capsule Take 1 capsule (180 mg total) by mouth daily. 180 capsule 2  . loratadine (CLARITIN) 10 MG tablet Take 10 mg by mouth daily.    Marland Kitchen LORazepam (ATIVAN) 0.5 MG tablet TAKE 1 TABLET TWICE DAILY AS NEEDED FOR ANXIETY. 20 tablet 0  . metoprolol succinate (TOPROL-XL) 25 MG 24 hr tablet TAKE 1 TABLET BY MOUTH DAILY 90 tablet 2  . Multiple Vitamins-Minerals (MULTIVITAMIN WITH MINERALS) tablet Take 1 tablet by mouth daily.      . Omega-3 Fatty Acids (FISH OIL TRIPLE STRENGTH) 1400 MG CAPS Take 1,400 mg by mouth daily.     Marland Kitchen omeprazole (PRILOSEC OTC) 20 MG tablet Take 20 mg by mouth daily.    . Potassium Citrate 15 MEQ (1620 MG) TBCR Take 15 mEq by mouth daily.  11  . tamsulosin (FLOMAX) 0.4 MG CAPS capsule Take 0.4 mg by mouth at bedtime.    Marland Kitchen zolpidem (AMBIEN) 5 MG tablet Take 1 tablet (5 mg total) by mouth at bedtime as needed for sleep. 30 tablet 0  . budesonide-formoterol (SYMBICORT) 160-4.5 MCG/ACT inhaler Inhale 2 puffs into the lungs 2 (two) times daily. (Patient not taking: Reported on 08/31/2017) 1 Inhaler 0  . busPIRone (BUSPAR) 10 MG tablet Take 0.5 tablets (5 mg total) by mouth 2 (two) times daily. (Patient not taking: Reported on 08/31/2017) 90 tablet 2  . mometasone (ASMANEX) 220 MCG/INH inhaler Inhale 1 puff into the lungs 2 (two) times daily. Reported on 03/21/2016 (Patient not taking: Reported on 08/31/2017) 1 Inhaler 5   No facility-administered medications prior to visit.      Allergies:   Codeine and Oxycodone   Social History   Social History  . Marital status: Married    Spouse name: N/A  . Number of children: N/A  . Years of education: N/A   Social History Main Topics  . Smoking  status: Never Smoker  . Smokeless tobacco: Never Used  . Alcohol use Yes     Comment: 01/21/2015 "maybe 4-5 drinks/yr"  . Drug use: No  . Sexual activity: Yes   Other Topics Concern  . None   Social History Narrative   Pt lives in [place] with spouse.  34 yo son is health.  Second son died in car accident.      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   ROS:   Please see  the history of present illness.    Snoring, joint swelling, back discomfort, right knee discomfort, some difficulty with sleeping, and anxiety. Continues to have fleeting chest discomfort the last 5-10 seconds and resolves.  All other systems reviewed and are negative.   PHYSICAL EXAM:   VS:  BP 112/68 (BP Location: Left Arm)   Pulse 76   Ht 6' (1.829 m)   Wt 243 lb 6.4 oz (110.4 kg)   BMI 33.01 kg/m    GEN: Well nourished, well developed, in no acute distress  HEENT: normal  Neck: no JVD, carotid bruits, or masses Cardiac: IRR; 2 to 3/6 crescendo decrescendo systolic right upper sternal border murmur. No rubs, or gallops,no edema . Respiratory:  clear to auscultation bilaterally, normal work of breathing GI: soft, nontender, nondistended, + BS MS: no deformity or atrophy  Skin: warm and dry, no rash Neuro:  Alert and Oriented x 3, Strength and sensation are intact Psych: euthymic mood, full affect  Wt Readings from Last 3 Encounters:  08/31/17 243 lb 6.4 oz (110.4 kg)  08/14/17 236 lb (107 kg)  08/09/17 239 lb (108.4 kg)      Studies/Labs Reviewed:   EKG:  EKG  Atrial bigeminy, nonspecific T wave flattening, otherwise normal appearance.  Recent Labs: No results found for requested labs within last 8760 hours.   Lipid Panel    Component Value Date/Time   CHOL 163 08/16/2016 0925   TRIG 140 08/16/2016 0925   HDL 45  08/16/2016 0925   CHOLHDL 3.6 08/16/2016 0925   VLDL 28 08/16/2016 0925   LDLCALC 90 08/16/2016 0925    Additional studies/ records that were reviewed today include:  Stress myocardial perfusion study June 2018: Study Highlights    Nuclear stress EF: 53%.  Blood pressure demonstrated a normal response to exercise.  There was no ST segment deviation noted during stress.  No T wave inversion was noted during stress.  The left ventricular ejection fraction is mildly decreased (45-54%).  The study is normal.  This is a low risk study.      ASSESSMENT:    1. Paroxysmal atrial fibrillation (HCC)   2. Aortic stenosis, moderate   3. OSA (obstructive sleep apnea)   4. Hyperlipidemia associated with type 2 diabetes mellitus ([place])   5. Essential hypertension   6. Chronic anticoagulation      PLAN:  In order of problems listed above:  1. Symptomatically controlled. No neurological complaints. No bleeding on anticoagulation therapy. 2. No symptoms to suggest progression. Plan 2-D Doppler echocardiogram in 11 months. 3. CPap is advocated 4. LDL target less than 70. 5. Blood pressure 130/85 or less. 6. No bleeding complications. Continue Eliquis.  Encouraged aerobic activity. Clinical follow-up in one year. Echo will be done prior to that office visit.    Medication Adjustments/Labs and Tests Ordered: Current medicines are reviewed at length with the patient today.  Concerns regarding medicines are outlined above.  Medication changes, Labs and Tests ordered today are listed in the Patient Instructions below. Patient Instructions  Medication Instructions:  Your physician recommends that you continue on your current medications as directed. Please refer to the Current Medication list given to you today.   Labwork: None  Testing/Procedures: Your physician has requested that you have an echocardiogram about 1 month prior to your 1 year follow up with Dr. Tamala Julian..  Echocardiography is a painless test that uses sound waves to create images of your heart. It provides your doctor with information about the  size and shape of your heart and how well your heart's chambers and valves are working. This procedure takes approximately one hour. There are no restrictions for this procedure.    Follow-Up: Your physician wants you to follow-up in: 1 year with Dr. Tamala Julian.  You will receive a reminder letter in the mail two months in advance. If you don't receive a letter, please call our office to schedule the follow-up appointment.   Any Other Special Instructions Will Be Listed Below (If Applicable).     If you need a refill on your cardiac medications before your next appointment, please call your pharmacy.      Signed, [name], MD  08/31/2017 10:40 AM    [facility] [address], [place], [place]  [address] Phone: [phone]; Fax: [phone]

## 2017-08-31 NOTE — Patient Instructions (Signed)
Medication Instructions:  Your physician recommends that you continue on your current medications as directed. Please refer to the Current Medication list given to you today.   Labwork: None  Testing/Procedures: Your physician has requested that you have an echocardiogram about 1 month prior to your 1 year follow up with Dr. Tamala Julian.. Echocardiography is a painless test that uses sound waves to create images of your heart. It provides your doctor with information about the size and shape of your heart and how well your heart's chambers and valves are working. This procedure takes approximately one hour. There are no restrictions for this procedure.    Follow-Up: Your physician wants you to follow-up in: 1 year with Dr. Tamala Julian.  You will receive a reminder letter in the mail two months in advance. If you don't receive a letter, please call our office to schedule the follow-up appointment.   Any Other Special Instructions Will Be Listed Below (If Applicable).     If you need a refill on your cardiac medications before your next appointment, please call your pharmacy.

## 2017-09-04 ENCOUNTER — Ambulatory Visit (INDEPENDENT_AMBULATORY_CARE_PROVIDER_SITE_OTHER): Payer: PPO | Admitting: *Deleted

## 2017-09-04 DIAGNOSIS — I480 Paroxysmal atrial fibrillation: Secondary | ICD-10-CM

## 2017-09-06 LAB — CUP PACEART REMOTE DEVICE CHECK
Date Time Interrogation Session: 20181022223919
Implantable Pulse Generator Implant Date: 20161101

## 2017-09-06 NOTE — Progress Notes (Signed)
Carelink Summary Report / Loop Recorder 

## 2017-09-17 ENCOUNTER — Other Ambulatory Visit: Payer: Self-pay | Admitting: Family Medicine

## 2017-09-18 ENCOUNTER — Telehealth: Payer: Self-pay

## 2017-09-18 NOTE — Telephone Encounter (Signed)
Pt aware. He will call back to schedule

## 2017-09-18 NOTE — Telephone Encounter (Signed)
Pt due for lipid check. He was just in office on 10/01, but requesting refill on Lipitor. (sent this in) but he wonders if we can do NV for labs? Thanks, RLB

## 2017-09-18 NOTE — Telephone Encounter (Signed)
Pt aware need for appt- he will call back to schedule appt. Jeremy Tucker

## 2017-09-18 NOTE — Telephone Encounter (Signed)
Okay to do nurse visit for labs lipid panel

## 2017-09-27 DIAGNOSIS — M79622 Pain in left upper arm: Secondary | ICD-10-CM | POA: Diagnosis not present

## 2017-09-27 DIAGNOSIS — M25561 Pain in right knee: Secondary | ICD-10-CM | POA: Diagnosis not present

## 2017-09-27 DIAGNOSIS — Z96652 Presence of left artificial knee joint: Secondary | ICD-10-CM | POA: Diagnosis not present

## 2017-09-27 DIAGNOSIS — M25562 Pain in left knee: Secondary | ICD-10-CM | POA: Diagnosis not present

## 2017-09-27 DIAGNOSIS — M7542 Impingement syndrome of left shoulder: Secondary | ICD-10-CM | POA: Diagnosis not present

## 2017-10-02 ENCOUNTER — Telehealth: Payer: Self-pay | Admitting: Interventional Cardiology

## 2017-10-02 ENCOUNTER — Telehealth: Payer: Self-pay | Admitting: Family Medicine

## 2017-10-02 NOTE — Telephone Encounter (Signed)
Walk In Pt Form-Clearance paper Dropped off placed in Dr.Smith Doc Box.

## 2017-10-02 NOTE — Telephone Encounter (Signed)
t came in and dropped off a form to be completed. It is for knee replacement. Pt was informed he would need an appt and he stated he was just in. Sending back to be completed.

## 2017-10-03 ENCOUNTER — Telehealth: Payer: Self-pay

## 2017-10-03 ENCOUNTER — Telehealth: Payer: Self-pay | Admitting: Interventional Cardiology

## 2017-10-03 NOTE — Telephone Encounter (Signed)
    [facility] Pre-operative Risk Assessment    Request for surgical clearance:  1. What type of surgery is being performed? Right TKA-medial and lateral w/wo patella resurfacing   2. When is this surgery scheduled? 10/30/17  3. Are there any medications that need to be held prior to surgery and how long? None listed   4. Practice name and name of physician performing surgery?  [facility] 2. Dr. Charyl Bigger. [place]    5. What is your office phone and fax number?  1. Phone [phone] 2. Fax [phone] Attn: Orson Slick    6. Anesthesia type (None, local, MAC, general) ? None listed    Teressa Senter 10/03/2017, 12:15 PM  _________________________________________________________________   (provider comments below)

## 2017-10-03 NOTE — Telephone Encounter (Signed)
The pt is advised that I have left him 2 bottles of Eliquis samples and a [employer] pt assistance application in our front office that he can pick up tomorrow as the office is closed on Thursday and Friday this week for Thanksgiving. He verbalized understanding and thanked me for my assistance.

## 2017-10-03 NOTE — Telephone Encounter (Signed)
   [facility] Pre-operative Risk Assessment    Request for surgical clearance:  1. What type of surgery is being performed? Right TKA-medial and lateral w/wo patella resurfacing   2. When is this surgery scheduled? 10/30/17  3. Are there any medications that need to be held prior to surgery and how long? None listed   4. Practice name and name of physician performing surgery?  [facility] 2. Dr. Charyl Bigger. [place]    5. What is your office phone and fax number?  1. Phone [phone] 2. Fax [phone] Attn: Orson Slick    6. Anesthesia type (None, local, MAC, general) ? None listed    Teressa Senter 10/03/2017, 12:15 PM  _________________________________________________________________   (provider comments below)

## 2017-10-03 NOTE — Telephone Encounter (Signed)
New Message     Patient calling the office for samples of medication:   1.  What medication and dosage are you requesting samples for? eliquis 5 mg 2x a day  2.  Are you currently out of this medication?  Only has a couple let, and its $150 due to donut hole

## 2017-10-04 ENCOUNTER — Ambulatory Visit (INDEPENDENT_AMBULATORY_CARE_PROVIDER_SITE_OTHER): Payer: PPO | Admitting: *Deleted

## 2017-10-04 DIAGNOSIS — I480 Paroxysmal atrial fibrillation: Secondary | ICD-10-CM | POA: Diagnosis not present

## 2017-10-04 NOTE — Telephone Encounter (Signed)
    Chart reviewed as part of pre-operative protocol coverage. Patient was contacted 10/04/2017 in reference to pre-operative risk assessment for pending surgery as outlined below.  Jeremy Tucker was last seen on 08/31/17 by Dr. Tamala Julian.  Since that day, Jeremy Tucker has done very well. He continues to exercise regularly at [facility] walking up hills without any CP, dyspnea, syncope, palpitations or functional limitation. RCRI is calculated at 0.9%.   Based ACC/AHA guidelines, Jeremy Tucker would be at acceptable risk for the planned procedure. However, I will reach out to Dr. Tamala Julian if this upcoming surgery changes desired timing of follow-up echocardiogram. He had moderate aortic stenosis by echo 12 months ago, with repeat echo planned in 2019. Dr. Tamala Julian, please route your reply to P CV DIV PREOP.  Once this info is known, will need to forward bundled response to requesting party. Will also forward to pharm pool given chronic anticoag.  Charlie Pitter, PA-C 10/04/2017, 5:05 PM

## 2017-10-04 NOTE — Telephone Encounter (Signed)
Pt takes Eliquis for afib with CHADS2VASc score of 3 (age, HTN, DM), also has hx of single provoked DVT after knee surgery more than 20 years ago. Renal function is normal. Ok to hold Eliquis for 3 days prior to procedure per protocol.

## 2017-10-06 NOTE — Telephone Encounter (Signed)
Cleared for surgery without further w/u.

## 2017-10-09 ENCOUNTER — Other Ambulatory Visit: Payer: Self-pay

## 2017-10-09 MED ORDER — APIXABAN 5 MG PO TABS: 5.0000 mg | ORAL_TABLET | Freq: Two times a day (BID) | ORAL | 3 refills | Status: DC

## 2017-10-09 NOTE — Telephone Encounter (Signed)
    Chart reviewed as part of pre-operative protocol coverage. See notes below.  In summary, - Based ACC/AHA guidelines, PAPE PARSON would be at acceptable risk for the planned procedure without further cardiovascular testing. - Per pharmacist, "Ok to hold Eliquis for 3 days prior to procedure per protocol."  Will route this bundled recommendation to requesting provider via Epic fax function. Please call with questions.    Charlie Pitter, PA-C  10/09/2017, 10:03 AM

## 2017-10-09 NOTE — Telephone Encounter (Signed)
I have completed the provider part of the [employer] pt assistance application and printed an Eliquis RX. I have given both to Dr Darliss Ridgel nurse. Awaiting Dr Darliss Ridgel signature.

## 2017-10-09 NOTE — Progress Notes (Signed)
Carelink Summary Report / Loop Recorder 

## 2017-10-09 NOTE — Telephone Encounter (Signed)
Dr Tamala Julian has signed the [employer] pt assistance application and the Eliquis RX. I have placed both in the yellow folder in the PA Dept.

## 2017-10-10 ENCOUNTER — Other Ambulatory Visit: Payer: Self-pay | Admitting: Pharmacist

## 2017-10-10 NOTE — Patient Outreach (Signed)
[facility]) Care Management  10/10/2017  NOEMI ISHMAEL 08-27-1951 [id number]   Patient was called to follow up on medication assistance. Forms were mailed to the patient earlier this year but he did not fill them out and return them.  There was some thought that he had not spent the required 3% of his income on medication expenses as is required by [place] Patient Assistance Program.    Patient said he was planning to get an additional 2 weeks supply of Eliquis from his provider and he would have enough until the beginning of the year.  Patient said he really appreciated all of [facility]'s help but he will not be completing the forms.  Plan: Close patient's case as he was referred for medication assistance and he is not going to complete the application for the program.  Elayne Guerin, PharmD, [place] Clinical Pharmacist [phone]

## 2017-10-18 NOTE — Patient Instructions (Addendum)
BRITTNEY CARAWAY  10/18/2017   Your procedure is scheduled on: Monday, Dec. 17, 2018   Report to [facility] Main  Entrance    Report to admitting at 6:30 AM   Call this number if you have problems the morning of surgery  [phone]   Do not eat food or drink liquids :After Midnight.     Take these medicines the morning of surgery with A SIP OF WATER: Allopurinol, Bupropion, Loratadine, Omeprazole, Lorazepam if needed   Use inhalers per normal routine and bring with you the day of surgery!              You may not have any metal on your body including jewelry  Do not wear lotions, powders, perfumes,  or deodorant                          Men may shave face and neck.   Do not bring valuables to the hospital. [place].   Contacts, dentures or bridgework may not be worn into surgery.   Leave suitcase in the car. After surgery it may be brought to your room.              Please read over the following fact sheets you were given: _____________________________________________________________________ [facility] - Preparing for Surgery Before surgery, you can play an important role.  Because skin is not sterile, your skin needs to be as free of germs as possible.  You can reduce the number of germs on your skin by washing with CHG (chlorahexidine gluconate) soap before surgery.  CHG is an antiseptic cleaner which kills germs and bonds with the skin to continue killing germs even after washing. Please DO NOT use if you have an allergy to CHG or antibacterial soaps.  If your skin becomes reddened/irritated stop using the CHG and inform your nurse when you arrive at Short Stay. Do not shave (including legs and underarms) for at least 48 hours prior to the first CHG shower.  You may shave your face/neck.  Please follow these instructions carefully:  1.  Shower with CHG Soap the night before surgery and the  morning  of surgery.  2.  If you choose to wash your hair, wash your hair first as usual with your normal  shampoo.  3.  After you shampoo, rinse your hair and body thoroughly to remove the shampoo.                             4.  Use CHG as you would any other liquid soap.  You can apply chg directly to the skin and wash.  Gently with a scrungie or clean washcloth.  5.  Apply the CHG Soap to your body ONLY FROM THE NECK DOWN.   Do  not use on face/ open                           Wound or open sores. Avoid contact with eyes, ears mouth and genitals (private parts).                       Wash face,  Genitals (  private parts) with your normal soap.             6.  Wash thoroughly, paying special attention to the area where your surgery  will be performed.  7.  Thoroughly rinse your body with warm water from the neck down.  8.  DO NOT shower/wash with your normal soap after using and rinsing off the CHG Soap.                9.  Pat yourself dry with a clean towel.            10.  Wear clean pajamas.            11.  Place clean sheets on your bed the night of your first shower and do not  sleep with pets. Day of Surgery : Do not apply any lotions/deodorants the morning of surgery.  Please wear clean clothes to the hospital/surgery center.  FAILURE TO FOLLOW THESE INSTRUCTIONS MAY RESULT IN THE CANCELLATION OF YOUR SURGERY  PATIENT SIGNATURE_________________________________  NURSE SIGNATURE__________________________________  ________________________________________________________________________   Adam Phenix  An incentive spirometer is a tool that can help keep your lungs clear and active. This tool measures how well you are filling your lungs with each breath. Taking long deep breaths may help reverse or decrease the chance of developing breathing (pulmonary) problems (especially infection) following:  A long period of time when you are unable to move or be active. BEFORE THE PROCEDURE    If the spirometer includes an indicator to show your best effort, your nurse or respiratory therapist will set it to a desired goal.  If possible, sit up straight or lean slightly forward. Try not to slouch.  Hold the incentive spirometer in an upright position. INSTRUCTIONS FOR USE  1. Sit on the edge of your bed if possible, or sit up as far as you can in bed or on a chair. 2. Hold the incentive spirometer in an upright position. 3. Breathe out normally. 4. Place the mouthpiece in your mouth and seal your lips tightly around it. 5. Breathe in slowly and as deeply as possible, raising the piston or the ball toward the top of the column. 6. Hold your breath for 3-5 seconds or for as long as possible. Allow the piston or ball to fall to the bottom of the column. 7. Remove the mouthpiece from your mouth and breathe out normally. 8. Rest for a few seconds and repeat Steps 1 through 7 at least 10 times every 1-2 hours when you are awake. Take your time and take a few normal breaths between deep breaths. 9. The spirometer may include an indicator to show your best effort. Use the indicator as a goal to work toward during each repetition. 10. After each set of 10 deep breaths, practice coughing to be sure your lungs are clear. If you have an incision (the cut made at the time of surgery), support your incision when coughing by placing a pillow or rolled up towels firmly against it. Once you are able to get out of bed, walk around indoors and cough well. You may stop using the incentive spirometer when instructed by your caregiver.  RISKS AND COMPLICATIONS  Take your time so you do not get dizzy or light-headed.  If you are in pain, you may need to take or ask for pain medication before doing incentive spirometry. It is harder to take a deep breath if you are having pain. AFTER USE  Rest  and breathe slowly and easily.  It can be helpful to keep track of a log of your progress. Your caregiver  can provide you with a simple table to help with this. If you are using the spirometer at home, follow these instructions: [place] IF:   You are having difficultly using the spirometer.  You have trouble using the spirometer as often as instructed.  Your pain medication is not giving enough relief while using the spirometer.  You develop fever of 100.5 F (38.1 C) or higher. SEEK IMMEDIATE MEDICAL CARE IF:   You cough up bloody sputum that had not been present before.  You develop fever of 102 F (38.9 C) or greater.  You develop worsening pain at or near the incision site. MAKE SURE YOU:   Understand these instructions.  Will watch your condition.  Will get help right away if you are not doing well or get worse. Document Released: 03/13/2007 Document Revised: 01/23/2012 Document Reviewed: 05/14/2007 ExitCare Patient Information 2014 ExitCare, [place].   ________________________________________________________________________  WHAT IS A BLOOD TRANSFUSION? Blood Transfusion Information  A transfusion is the replacement of blood or some of its parts. Blood is made up of multiple cells which provide different functions.  Red blood cells carry oxygen and are used for blood loss replacement.  White blood cells fight against infection.  Platelets control bleeding.  Plasma helps clot blood.  Other blood products are available for specialized needs, such as hemophilia or other clotting disorders. BEFORE THE TRANSFUSION  Who gives blood for transfusions?   Healthy volunteers who are fully evaluated to make sure their blood is safe. This is blood bank blood. Transfusion therapy is the safest it has ever been in the practice of medicine. Before blood is taken from a donor, a complete history is taken to make sure that person has no history of diseases nor engages in risky social behavior (examples are intravenous drug use or sexual activity with multiple partners). The  donor's travel history is screened to minimize risk of transmitting infections, such as malaria. The donated blood is tested for signs of infectious diseases, such as HIV and hepatitis. The blood is then tested to be sure it is compatible with you in order to minimize the chance of a transfusion reaction. If you or a relative donates blood, this is often done in anticipation of surgery and is not appropriate for emergency situations. It takes many days to process the donated blood. RISKS AND COMPLICATIONS Although transfusion therapy is very safe and saves many lives, the main dangers of transfusion include:   Getting an infectious disease.  Developing a transfusion reaction. This is an allergic reaction to something in the blood you were given. Every precaution is taken to prevent this. The decision to have a blood transfusion has been considered carefully by your caregiver before blood is given. Blood is not given unless the benefits outweigh the risks. AFTER THE TRANSFUSION  Right after receiving a blood transfusion, you will usually feel much better and more energetic. This is especially true if your red blood cells have gotten low (anemic). The transfusion raises the level of the red blood cells which carry oxygen, and this usually causes an energy increase.  The nurse administering the transfusion will monitor you carefully for complications. HOME CARE INSTRUCTIONS  No special instructions are needed after a transfusion. You may find your energy is better. Speak with your caregiver about any limitations on activity for underlying diseases you may have. SEEK  MEDICAL CARE IF:   Your condition is not improving after your transfusion.  You develop redness or irritation at the intravenous (IV) site. SEEK IMMEDIATE MEDICAL CARE IF:  Any of the following symptoms occur over the next 12 hours:  Shaking chills.  You have a temperature by mouth above 102 F (38.9 C), not controlled by  medicine.  Chest, back, or muscle pain.  People around you feel you are not acting correctly or are confused.  Shortness of breath or difficulty breathing.  Dizziness and fainting.  You get a rash or develop hives.  You have a decrease in urine output.  Your urine turns a dark color or changes to pink, red, or brown. Any of the following symptoms occur over the next 10 days:  You have a temperature by mouth above 102 F (38.9 C), not controlled by medicine.  Shortness of breath.  Weakness after normal activity.  The white part of the eye turns yellow (jaundice).  You have a decrease in the amount of urine or are urinating less often.  Your urine turns a dark color or changes to pink, red, or brown. Document Released: 10/28/2000 Document Revised: 01/23/2012 Document Reviewed: 06/16/2008 [facility] Patient Information 2014 [place], [place].  _______________________________________________________________________

## 2017-10-18 NOTE — Pre-Procedure Instructions (Signed)
Loop recorder orders are in chart  The following are in epic: Surgical clearance ([facility] 10/09/17 EKG 08/31/17 Pacer remote device check 09/04/17 Stress test 04/26/17 Echo 10/11/16

## 2017-10-19 LAB — CUP PACEART REMOTE DEVICE CHECK
Date Time Interrogation Session: 20181121224320
Implantable Pulse Generator Implant Date: 20161101

## 2017-10-26 ENCOUNTER — Other Ambulatory Visit: Payer: Self-pay

## 2017-10-26 ENCOUNTER — Encounter (HOSPITAL_COMMUNITY)
Admission: RE | Admit: 2017-10-26 | Discharge: 2017-10-26 | Disposition: A | Discharge status: Home / Self Care | Payer: PPO | Source: Ambulatory Visit | Attending: Orthopedic Surgery | Admitting: Orthopedic Surgery

## 2017-10-26 ENCOUNTER — Telehealth: Payer: Self-pay | Admitting: Interventional Cardiology

## 2017-10-26 ENCOUNTER — Encounter (HOSPITAL_COMMUNITY): Payer: Self-pay

## 2017-10-26 DIAGNOSIS — M1711 Unilateral primary osteoarthritis, right knee: Secondary | ICD-10-CM | POA: Insufficient documentation

## 2017-10-26 DIAGNOSIS — G4733 Obstructive sleep apnea (adult) (pediatric): Secondary | ICD-10-CM | POA: Insufficient documentation

## 2017-10-26 DIAGNOSIS — K219 Gastro-esophageal reflux disease without esophagitis: Secondary | ICD-10-CM | POA: Diagnosis not present

## 2017-10-26 DIAGNOSIS — Z7901 Long term (current) use of anticoagulants: Secondary | ICD-10-CM | POA: Diagnosis not present

## 2017-10-26 DIAGNOSIS — Z79899 Other long term (current) drug therapy: Secondary | ICD-10-CM | POA: Diagnosis not present

## 2017-10-26 DIAGNOSIS — I10 Essential (primary) hypertension: Secondary | ICD-10-CM | POA: Insufficient documentation

## 2017-10-26 DIAGNOSIS — Z01818 Encounter for other preprocedural examination: Secondary | ICD-10-CM | POA: Insufficient documentation

## 2017-10-26 DIAGNOSIS — E119 Type 2 diabetes mellitus without complications: Secondary | ICD-10-CM | POA: Insufficient documentation

## 2017-10-26 DIAGNOSIS — E785 Hyperlipidemia, unspecified: Secondary | ICD-10-CM | POA: Diagnosis not present

## 2017-10-26 HISTORY — DX: Presence of other cardiac implants and grafts: Z95.818

## 2017-10-26 HISTORY — DX: Bronchitis, not specified as acute or chronic: J40

## 2017-10-26 LAB — BASIC METABOLIC PANEL
Anion gap: 8 (ref 5–15)
BUN: 14 mg/dL (ref 6–20)
CO2: 28 mmol/L (ref 22–32)
Calcium: 9.1 mg/dL (ref 8.9–10.3)
Chloride: 101 mmol/L (ref 101–111)
Creatinine, Ser: 0.69 mg/dL (ref 0.61–1.24)
GFR calc Af Amer: >60 mL/min (ref >60)
GFR calc non Af Amer: >60 mL/min (ref >60)
Glucose, Bld: 122 mg/dL — ABNORMAL HIGH (ref 65–99)
Potassium: 4.1 mmol/L (ref 3.5–5.1)
Sodium: 137 mmol/L (ref 135–145)

## 2017-10-26 LAB — ABO/RH: ABO/RH(D): A POS

## 2017-10-26 LAB — SURGICAL PCR SCREEN
MRSA, PCR: NEGATIVE
Staphylococcus aureus: NEGATIVE

## 2017-10-26 LAB — CBC
HCT: 42 % (ref 39.0–52.0)
Hemoglobin: 14.3 g/dL (ref 13.0–17.0)
MCH: 31.4 pg (ref 26.0–34.0)
MCHC: 34 g/dL (ref 30.0–36.0)
MCV: 92.1 fL (ref 78.0–100.0)
Platelets: 202 10*3/uL (ref 150–400)
RBC: 4.56 MIL/uL (ref 4.22–5.81)
RDW: 12.6 % (ref 11.5–15.5)
WBC: 7.7 10*3/uL (ref 4.0–10.5)

## 2017-10-26 NOTE — Telephone Encounter (Signed)
Attempted to call Jeremy Tucker and got VM.  Spoke with pt and he states they said they never received clearance from our office.  Advised I would send again but it was addressed back in Nov.  Pt appreciative for assistance.

## 2017-10-26 NOTE — Telephone Encounter (Signed)
New message  Pt verbalized that he is calling for the rn  Call [employer] [phone]

## 2017-10-28 LAB — HEMOGLOBIN A1C
Hgb A1c MFr Bld: 7.2 % — ABNORMAL HIGH (ref 4.8–5.6)
Mean Plasma Glucose: 160 mg/dL

## 2017-10-29 NOTE — H&P (Signed)
TOTAL KNEE ADMISSION H&P  Patient is being admitted for right total knee arthroplasty.  Subjective:  Chief Complaint:    Right knee primary OA / pain  HPI: Jeremy Tucker, 66 y.o. male, has a history of pain and functional disability in the right knee due to arthritis and has failed non-surgical conservative treatments for greater than 12 weeks to includeNSAID's and/or analgesics, corticosteriod injections, viscosupplementation injections and activity modification.  Onset of symptoms was gradual, starting  years ago with gradually worsening course since that time. The patient noted prior procedures on the knee to include  arthroscopy on the right knee(s).  Patient currently rates pain in the right knee(s) at 9 out of 10 with activity. Patient has worsening of pain with activity and weight bearing, pain that interferes with activities of daily living, pain with passive range of motion, crepitus and joint swelling.  Patient has evidence of periarticular osteophytes and joint space narrowing by imaging studies.  There is no active infection.   Risks, benefits and expectations were discussed with the patient.  Risks including but not limited to the risk of anesthesia, blood clots, nerve damage, blood vessel damage, failure of the prosthesis, infection and up to and including death.  Patient understand the risks, benefits and expectations and wishes to proceed with surgery.   PCP: Denita Lung, MD  D/C Plans:       Home   Post-op Meds:       No Rx given   Tranexamic Acid:      To be given -  topically  Decadron:      Is to be given  FYI:     Eliquis  Dilaudid (can't tolerate codeine)   DME:   Pt already has equipment   PT:   Rx given for OPPT    Patient Active Problem List   Diagnosis Date Noted  . Chronic anticoagulation 08/31/2017  . Insomnia 03/16/2017  . History of renal stone 06/10/2015  . Essential hypertension 05/25/2015  . Paroxysmal atrial fibrillation (HCC)   . Aortic  stenosis, moderate   . OSA (obstructive sleep apnea) 03/09/2015  . GERD (gastroesophageal reflux disease)   . BPH (benign prostatic hyperplasia)   . Gout   . Diastolic dysfunction-grade 15 Sep 2014 01/21/2015  . Hyperlipidemia associated with type 2 diabetes mellitus ([place]) 12/05/2011  . Obesity (BMI 30-39.9) 12/05/2011  . Asthma 12/05/2011  . Arthritis 12/05/2011  . ED (erectile dysfunction) 12/05/2011   Past Medical History:  Diagnosis Date  . Aortic stenosis, mild   . BPH (benign prostatic hyperplasia)   . Bronchitis    patient mentions " i am prone to bronchitis"   . Controlled diabetes mellitus type 2 with complications ([place])   . Depression   . DJD (degenerative joint disease)   . DVT (deep venous thrombosis) ()    post op knee surgery in 2001 , behind calf  ; was resolved with blood thinners   . Dysrhythmia    Atrial fibrillation  . GERD (gastroesophageal reflux disease)   . Glucose intolerance (impaired glucose tolerance)   . Gout    denies  . Hyperlipidemia   . Hyperlipidemia associated with type 2 diabetes mellitus ([place])   . Hypertension    denies  . Hypertension associated with diabetes ([place])   . Kidney stones   . Obesity   . Persistent atrial fibrillation ([place])    a. CHADsVASc score at least 2  . Status post placement of implantable loop recorder 2  years ago ; 2016   left upper chest     Past Surgical History:  Procedure Laterality Date  . ATRIAL FIBRILLATION ABLATION N/A 03/12/2015   Procedure: ATRIAL FIBRILLATION ABLATION;  Surgeon: [name], MD;  Location: [facility] CATH LAB;  Service: Cardiovascular;  Laterality: N/A;  . CARDIAC CATHETERIZATION  1990's   "Dr. Melvern Banker", no blockages per pt  . CARDIOVERSION N/A 02/25/2015   Procedure: CARDIOVERSION;  Surgeon: Sueanne Margarita, MD;  Location: [facility];  Service: Cardiovascular;  Laterality: N/A;  . COLONOSCOPY  2010   [facility]  . CYSTOSCOPY W/ STONE MANIPULATION  1980's   "couldn't get stone so they had to  cut me open"  . CYSTOSCOPY WITH RETROGRADE PYELOGRAM, URETEROSCOPY AND STENT PLACEMENT Left 04/24/2015   Procedure: URETHRAL MEATAL DILATION, CYSTOSCOPY WITH LEFT RETROGRADE PYELOGRAM, LEFT URETEROSCOPY, STONE BASKET EXTRACTION,  LEFT DOUBLE J STENT PLACEMENT;  Surgeon: Carolan Clines, MD;  Location: [facility] ORS;  Service: Urology;  Laterality: Left;  . EP IMPLANTABLE DEVICE N/A 09/15/2015   Procedure: Loop Recorder Insertion;  Surgeon: [name], MD;  Location: [facility] CV LAB;  Service: Cardiovascular;  Laterality: N/A;  . EYE SURGERY Bilateral 2016   cataract surgery with implant ; dr Gerald Stabs grote   . FOREARM FRACTURE SURGERY Right ~ 1961  . FRACTURE SURGERY    . HOLMIUM LASER APPLICATION Left 3/97/6734   Procedure: HOLMIUM LASER APPLICATION;  Surgeon: Carolan Clines, MD;  Location: [facility] ORS;  Service: Urology;  Laterality: Left;  . JOINT REPLACEMENT    . KIDNEY STONE SURGERY  1980's   "stone lodged in the duct; had to cut me open to get it"  . KNEE ARTHROSCOPY Bilateral "multiple times"  . TEE WITHOUT CARDIOVERSION N/A 03/12/2015   Procedure: TRANSESOPHAGEAL ECHOCARDIOGRAM (TEE);  Surgeon: Pixie Casino, MD;  Location: [facility] ENDOSCOPY;  Service: Cardiovascular;  Laterality: N/A;  . TONSILLECTOMY    . TOTAL KNEE ARTHROPLASTY Left 2001   [facility]    No current facility-administered medications for this encounter.    Current Outpatient Medications  Medication Sig Dispense Refill Last Dose  . acetaminophen (TYLENOL) 500 MG tablet Take 1,000 mg by mouth every 6 (six) hours as needed for headache.    Taking  . albuterol (PROVENTIL HFA;VENTOLIN HFA) 108 (90 Base) MCG/ACT inhaler Inhale 2 puffs into the lungs every 6 (six) hours as needed for wheezing or shortness of breath. 1 Inhaler 0 Taking  . allopurinol (ZYLOPRIM) 300 MG tablet Take 300 mg by mouth daily.     Taking  . ammonium lactate (LAC-HYDRIN) 12 % lotion Apply 1 application topically daily as needed for dry skin (3-4 TIMES WEEK).     Taking  . apixaban (ELIQUIS) 5 MG TABS tablet Take 1 tablet (5 mg total) by mouth 2 (two) times daily. 180 tablet 3   . atorvastatin (LIPITOR) 20 MG tablet TAKE 1 TABLET BY MOUTH EVERY DAY (Patient taking differently: TAKE 1 TABLET BY MOUTH EVERY EVENING) 90 tablet 0   . buPROPion (WELLBUTRIN XL) 300 MG 24 hr tablet Take 1 tablet (300 mg total) by mouth daily. (Patient taking differently: Take 150 mg by mouth daily. ) 90 tablet 0 Taking  . busPIRone (BUSPAR) 10 MG tablet Take 10 mg by mouth daily.  2   . diltiazem (CARDIZEM CD) 180 MG 24 hr capsule Take 1 capsule (180 mg total) by mouth daily. (Patient taking differently: Take 180 mg by mouth every evening. ) 180 capsule 2 Taking  . loratadine (CLARITIN) 10 MG tablet  Take 10 mg by mouth daily.   Taking  . LORazepam (ATIVAN) 0.5 MG tablet TAKE 1 TABLET TWICE DAILY AS NEEDED FOR ANXIETY. 20 tablet 0 Taking  . metoprolol succinate (TOPROL-XL) 25 MG 24 hr tablet TAKE 1 TABLET BY MOUTH DAILY (Patient taking differently: TAKE 1 TABLET BY MOUTH DAILY IN EVENING) 90 tablet 2 Taking  . Multiple Vitamins-Minerals (MULTIVITAMIN WITH MINERALS) tablet Take 1 tablet by mouth daily.     Taking  . olopatadine (PATANOL) 0.1 % ophthalmic solution Place 1 drop into the left eye as needed (tearing).     . Omega-3 Fatty Acids (FISH OIL TRIPLE STRENGTH) 1400 MG CAPS Take 1,400 mg by mouth daily.    Taking  . omeprazole (PRILOSEC) 20 MG capsule Take 20 mg by mouth daily.     . Potassium Citrate 15 MEQ (1620 MG) TBCR Take 15 mEq by mouth daily.  11 Taking  . tamsulosin (FLOMAX) 0.4 MG CAPS capsule Take 0.4 mg by mouth at bedtime.   Taking  . zolpidem (AMBIEN) 5 MG tablet Take 1 tablet (5 mg total) by mouth at bedtime as needed for sleep. 30 tablet 0 Taking   Allergies  Allergen Reactions  . Codeine Itching  . Oxycodone Itching    Social History   Tobacco Use  . Smoking status: Never Smoker  . Smokeless tobacco: Never Used  Substance Use Topics  . Alcohol use:  Yes    Comment: seldom     Family History  Problem Relation Age of Onset  . Heart attack Mother 48       MI  . Heart attack Father 74       ? MI versus trauma to head  . Healthy Brother      Review of Systems  Constitutional: Negative.   HENT: Negative.   Eyes: Negative.   Respiratory: Negative.   Cardiovascular: Negative.   Gastrointestinal: Positive for heartburn.  Genitourinary: Negative.   Musculoskeletal: Positive for joint pain.  Skin: Negative.   Neurological: Negative.   Endo/Heme/Allergies: Negative.   Psychiatric/Behavioral: The patient has insomnia.     Objective:  Physical Exam  Constitutional: He is oriented to person, place, and time. He appears well-developed.  HENT:  Head: Normocephalic.  Eyes: Pupils are equal, round, and reactive to light.  Neck: Neck supple. No JVD present. No tracheal deviation present. No thyromegaly present.  Cardiovascular: Normal rate, regular rhythm and intact distal pulses.  Respiratory: Effort normal and breath sounds normal. No respiratory distress. He has no wheezes.  GI: Soft. There is no tenderness. There is no guarding.  Musculoskeletal:       Right knee: He exhibits decreased range of motion, swelling and bony tenderness. He exhibits no ecchymosis, no deformity, no laceration and no erythema. Tenderness found.  Lymphadenopathy:    He has no cervical adenopathy.  Neurological: He is alert and oriented to person, place, and time.  Skin: Skin is warm and dry.  Psychiatric: He has a normal mood and affect.      Labs:  Estimated body mass index is 33.63 kg/m as calculated from the following:   Height as of 10/26/17: 6' (1.829 m).   Weight as of 10/26/17: 112.5 kg (248 lb).   Imaging Review Plain radiographs demonstrate severe degenerative joint disease of the right knee(s). The overall alignment isneutral. The bone quality appears to be good for age and reported activity level.  Assessment/Plan:  End stage  arthritis, right knee   The patient history, physical examination,  clinical judgment of the provider and imaging studies are consistent with end stage degenerative joint disease of the right knee(s) and total knee arthroplasty is deemed medically necessary. The treatment options including medical management, injection therapy arthroscopy and arthroplasty were discussed at length. The risks and benefits of total knee arthroplasty were presented and reviewed. The risks due to aseptic loosening, infection, stiffness, patella tracking problems, thromboembolic complications and other imponderables were discussed. The patient acknowledged the explanation, agreed to proceed with the plan and consent was signed. Patient is being admitted for inpatient treatment for surgery, pain control, PT, OT, prophylactic antibiotics, VTE prophylaxis, progressive ambulation and ADL's and discharge planning. The patient is planning to be discharged home.     West Pugh Babish   PA-C  10/29/2017, 9:06 PM

## 2017-10-30 ENCOUNTER — Observation Stay (HOSPITAL_COMMUNITY)
Admission: RE | Admit: 2017-10-30 | Discharge: 2017-10-31 | Disposition: A | Discharge status: Home / Self Care | Payer: PPO | Source: Ambulatory Visit | Attending: Orthopedic Surgery | Admitting: Orthopedic Surgery

## 2017-10-30 ENCOUNTER — Encounter (HOSPITAL_COMMUNITY)
Admission: RE | Disposition: A | Discharge status: Home / Self Care | Payer: Self-pay | Source: Ambulatory Visit | Attending: Orthopedic Surgery

## 2017-10-30 ENCOUNTER — Inpatient Hospital Stay (HOSPITAL_COMMUNITY): Payer: PPO | Admitting: Certified Registered Nurse Anesthetist

## 2017-10-30 ENCOUNTER — Encounter (HOSPITAL_COMMUNITY): Payer: Self-pay | Admitting: *Deleted

## 2017-10-30 ENCOUNTER — Other Ambulatory Visit: Payer: Self-pay

## 2017-10-30 DIAGNOSIS — M109 Gout, unspecified: Secondary | ICD-10-CM | POA: Insufficient documentation

## 2017-10-30 DIAGNOSIS — I480 Paroxysmal atrial fibrillation: Secondary | ICD-10-CM | POA: Diagnosis not present

## 2017-10-30 DIAGNOSIS — E785 Hyperlipidemia, unspecified: Secondary | ICD-10-CM | POA: Insufficient documentation

## 2017-10-30 DIAGNOSIS — Z79899 Other long term (current) drug therapy: Secondary | ICD-10-CM | POA: Insufficient documentation

## 2017-10-30 DIAGNOSIS — N400 Enlarged prostate without lower urinary tract symptoms: Secondary | ICD-10-CM | POA: Diagnosis not present

## 2017-10-30 DIAGNOSIS — I481 Persistent atrial fibrillation: Secondary | ICD-10-CM | POA: Insufficient documentation

## 2017-10-30 DIAGNOSIS — F329 Major depressive disorder, single episode, unspecified: Secondary | ICD-10-CM | POA: Diagnosis not present

## 2017-10-30 DIAGNOSIS — M6281 Muscle weakness (generalized): Secondary | ICD-10-CM | POA: Insufficient documentation

## 2017-10-30 DIAGNOSIS — E119 Type 2 diabetes mellitus without complications: Secondary | ICD-10-CM | POA: Insufficient documentation

## 2017-10-30 DIAGNOSIS — K219 Gastro-esophageal reflux disease without esophagitis: Secondary | ICD-10-CM | POA: Diagnosis not present

## 2017-10-30 DIAGNOSIS — I10 Essential (primary) hypertension: Secondary | ICD-10-CM | POA: Diagnosis not present

## 2017-10-30 DIAGNOSIS — Z96659 Presence of unspecified artificial knee joint: Secondary | ICD-10-CM

## 2017-10-30 DIAGNOSIS — I350 Nonrheumatic aortic (valve) stenosis: Secondary | ICD-10-CM | POA: Insufficient documentation

## 2017-10-30 DIAGNOSIS — M1711 Unilateral primary osteoarthritis, right knee: Principal | ICD-10-CM | POA: Insufficient documentation

## 2017-10-30 DIAGNOSIS — Z87442 Personal history of urinary calculi: Secondary | ICD-10-CM | POA: Diagnosis not present

## 2017-10-30 DIAGNOSIS — Z7902 Long term (current) use of antithrombotics/antiplatelets: Secondary | ICD-10-CM | POA: Insufficient documentation

## 2017-10-30 DIAGNOSIS — G8918 Other acute postprocedural pain: Secondary | ICD-10-CM | POA: Diagnosis not present

## 2017-10-30 HISTORY — PX: TOTAL KNEE ARTHROPLASTY: SHX125

## 2017-10-30 LAB — GLUCOSE, CAPILLARY
Glucose-Capillary: 148 mg/dL — ABNORMAL HIGH (ref 65–99)
Glucose-Capillary: 261 mg/dL — ABNORMAL HIGH (ref 65–99)
Glucose-Capillary: 193 mg/dL — ABNORMAL HIGH (ref 65–99)

## 2017-10-30 LAB — TYPE AND SCREEN
ABO/RH(D): A POS
Antibody Screen: NEGATIVE

## 2017-10-30 SURGERY — ARTHROPLASTY, KNEE, TOTAL
Anesthesia: Regional | Site: Knee | Laterality: Right

## 2017-10-30 MED ORDER — TAMSULOSIN HCL 0.4 MG PO CAPS
0.4000 mg | ORAL_CAPSULE | Freq: Every day | ORAL | Status: DC
  Administered 2017-10-30: 0.4 mg via ORAL
  Filled 2017-10-30: qty 1

## 2017-10-30 MED ORDER — KETOROLAC TROMETHAMINE 30 MG/ML IJ SOLN
INTRAMUSCULAR | Status: DC | PRN
  Administered 2017-10-30: 30 mg

## 2017-10-30 MED ORDER — POLYETHYLENE GLYCOL 3350 17 G PO PACK: 17.0000 g | PACK | Freq: Two times a day (BID) | ORAL | Status: DC

## 2017-10-30 MED ORDER — FERROUS SULFATE 325 (65 FE) MG PO TABS: 325.0000 mg | ORAL_TABLET | Freq: Three times a day (TID) | ORAL | 3 refills | Status: DC

## 2017-10-30 MED ORDER — ONDANSETRON HCL 4 MG/2ML IJ SOLN
INTRAMUSCULAR | Status: DC | PRN
Start: 2017-10-30 — End: 2017-10-30
  Administered 2017-10-30: 4 mg via INTRAVENOUS

## 2017-10-30 MED ORDER — SODIUM CHLORIDE 0.9 % IR SOLN
Status: DC | PRN
  Administered 2017-10-30: 1000 mL

## 2017-10-30 MED ORDER — METHOCARBAMOL 500 MG PO TABS: 500.0000 mg | ORAL_TABLET | Freq: Four times a day (QID) | ORAL | 0 refills | Status: DC | PRN

## 2017-10-30 MED ORDER — MENTHOL 3 MG MT LOZG: 1.0000 | LOZENGE | OROMUCOSAL | Status: DC | PRN

## 2017-10-30 MED ORDER — CELECOXIB 200 MG PO CAPS
200.0000 mg | ORAL_CAPSULE | Freq: Two times a day (BID) | ORAL | Status: DC
  Administered 2017-10-31: 200 mg via ORAL
  Filled 2017-10-30 (×2): qty 1

## 2017-10-30 MED ORDER — KETOROLAC TROMETHAMINE 30 MG/ML IJ SOLN
INTRAMUSCULAR | Status: AC
  Filled 2017-10-30: qty 1

## 2017-10-30 MED ORDER — DEXAMETHASONE SODIUM PHOSPHATE 10 MG/ML IJ SOLN
INTRAMUSCULAR | Status: AC
  Filled 2017-10-30: qty 1

## 2017-10-30 MED ORDER — BUSPIRONE HCL 5 MG PO TABS
10.0000 mg | ORAL_TABLET | Freq: Every day | ORAL | Status: DC
  Administered 2017-10-31: 10 mg via ORAL
  Filled 2017-10-30: qty 2

## 2017-10-30 MED ORDER — ONDANSETRON HCL 4 MG PO TABS: 4.0000 mg | ORAL_TABLET | Freq: Four times a day (QID) | ORAL | Status: DC | PRN

## 2017-10-30 MED ORDER — SODIUM CHLORIDE 0.9 % IJ SOLN
INTRAMUSCULAR | Status: DC | PRN
  Administered 2017-10-30: 30 mL

## 2017-10-30 MED ORDER — BUPROPION HCL ER (XL) 150 MG PO TB24
150.0000 mg | ORAL_TABLET | Freq: Every day | ORAL | Status: DC
  Administered 2017-10-31: 150 mg via ORAL
  Filled 2017-10-30: qty 1

## 2017-10-30 MED ORDER — SODIUM CHLORIDE 0.9 % IJ SOLN
INTRAMUSCULAR | Status: AC
  Filled 2017-10-30: qty 50

## 2017-10-30 MED ORDER — OMEPRAZOLE 20 MG PO CPDR
20.0000 mg | DELAYED_RELEASE_CAPSULE | Freq: Every day | ORAL | Status: DC
  Administered 2017-10-31: 20 mg via ORAL
  Filled 2017-10-30: qty 1

## 2017-10-30 MED ORDER — ALLOPURINOL 300 MG PO TABS
300.0000 mg | ORAL_TABLET | Freq: Every day | ORAL | Status: DC
Start: 2017-10-31 — End: 2017-10-31
  Administered 2017-10-31: 300 mg via ORAL
  Filled 2017-10-30: qty 1

## 2017-10-30 MED ORDER — BUPIVACAINE IN DEXTROSE 0.75-8.25 % IT SOLN
INTRATHECAL | Status: DC | PRN
  Administered 2017-10-30: 1.8 mL via INTRATHECAL

## 2017-10-30 MED ORDER — PROPOFOL 10 MG/ML IV BOLUS
INTRAVENOUS | Status: AC
  Filled 2017-10-30: qty 40

## 2017-10-30 MED ORDER — ACETAMINOPHEN 325 MG PO TABS
650.0000 mg | ORAL_TABLET | ORAL | Status: DC | PRN
  Administered 2017-10-30 – 2017-10-31 (×2): 650 mg via ORAL
  Filled 2017-10-30 (×2): qty 2

## 2017-10-30 MED ORDER — POLYETHYLENE GLYCOL 3350 17 G PO PACK: 17.0000 g | PACK | Freq: Two times a day (BID) | ORAL | 0 refills | Status: DC

## 2017-10-30 MED ORDER — BISACODYL 10 MG RE SUPP: 10.0000 mg | Freq: Every day | RECTAL | Status: DC | PRN

## 2017-10-30 MED ORDER — FENTANYL CITRATE (PF) 100 MCG/2ML IJ SOLN: 25.0000 ug | INTRAMUSCULAR | Status: DC | PRN

## 2017-10-30 MED ORDER — HYDROMORPHONE HCL 1 MG/ML IJ SOLN
0.5000 mg | INTRAMUSCULAR | Status: DC | PRN
  Administered 2017-10-30: 1.5 mg via INTRAVENOUS
  Filled 2017-10-30: qty 2
  Filled 2017-10-30: qty 1

## 2017-10-30 MED ORDER — METHOCARBAMOL 1000 MG/10ML IJ SOLN
500.0000 mg | Freq: Four times a day (QID) | INTRAVENOUS | Status: DC | PRN
  Administered 2017-10-30: 500 mg via INTRAVENOUS
  Filled 2017-10-30: qty 550

## 2017-10-30 MED ORDER — LORATADINE 10 MG PO TABS
10.0000 mg | ORAL_TABLET | Freq: Every day | ORAL | Status: DC
  Administered 2017-10-31: 10 mg via ORAL
  Filled 2017-10-30: qty 1

## 2017-10-30 MED ORDER — POTASSIUM CITRATE ER 10 MEQ (1080 MG) PO TBCR
10.0000 meq | EXTENDED_RELEASE_TABLET | Freq: Every day | ORAL | Status: DC
  Administered 2017-10-31: 10 meq via ORAL
  Filled 2017-10-30: qty 1

## 2017-10-30 MED ORDER — ALBUTEROL SULFATE (2.5 MG/3ML) 0.083% IN NEBU: 2.5000 mg | INHALATION_SOLUTION | Freq: Four times a day (QID) | RESPIRATORY_TRACT | Status: DC | PRN

## 2017-10-30 MED ORDER — FERROUS SULFATE 325 (65 FE) MG PO TABS
325.0000 mg | ORAL_TABLET | Freq: Three times a day (TID) | ORAL | Status: DC
Start: 2017-10-30 — End: 2017-10-31
  Administered 2017-10-30 – 2017-10-31 (×2): 325 mg via ORAL
  Filled 2017-10-30 (×2): qty 1

## 2017-10-30 MED ORDER — POTASSIUM CITRATE ER 15 MEQ (1620 MG) PO TBCR: EXTENDED_RELEASE_TABLET | Freq: Every day | ORAL | Status: DC

## 2017-10-30 MED ORDER — HYDROMORPHONE HCL 2 MG PO TABS
2.0000 mg | ORAL_TABLET | ORAL | Status: DC | PRN
  Administered 2017-10-30 – 2017-10-31 (×4): 2 mg via ORAL
  Filled 2017-10-30 (×4): qty 1

## 2017-10-30 MED ORDER — ONDANSETRON HCL 4 MG/2ML IJ SOLN
INTRAMUSCULAR | Status: AC
  Filled 2017-10-30: qty 2

## 2017-10-30 MED ORDER — MIDAZOLAM HCL 2 MG/2ML IJ SOLN
1.0000 mg | INTRAMUSCULAR | Status: DC
  Administered 2017-10-30: 2 mg via INTRAVENOUS

## 2017-10-30 MED ORDER — HYDROMORPHONE HCL 2 MG PO TABS: 2.0000 mg | ORAL_TABLET | Freq: Four times a day (QID) | ORAL | 0 refills | Status: DC | PRN

## 2017-10-30 MED ORDER — CEFAZOLIN SODIUM-DEXTROSE 2-4 GM/100ML-% IV SOLN
2.0000 g | INTRAVENOUS | Status: AC
  Administered 2017-10-30: 2 g via INTRAVENOUS

## 2017-10-30 MED ORDER — MIDAZOLAM HCL 2 MG/2ML IJ SOLN
INTRAMUSCULAR | Status: AC
  Filled 2017-10-30: qty 2

## 2017-10-30 MED ORDER — METOCLOPRAMIDE HCL 5 MG/ML IJ SOLN: 5.0000 mg | Freq: Three times a day (TID) | INTRAMUSCULAR | Status: DC | PRN

## 2017-10-30 MED ORDER — CHLORHEXIDINE GLUCONATE 4 % EX LIQD: 60.0000 mL | Freq: Once | CUTANEOUS | Status: DC

## 2017-10-30 MED ORDER — PROPOFOL 10 MG/ML IV BOLUS
INTRAVENOUS | Status: AC
  Filled 2017-10-30: qty 20

## 2017-10-30 MED ORDER — METOCLOPRAMIDE HCL 5 MG PO TABS: 5.0000 mg | ORAL_TABLET | Freq: Three times a day (TID) | ORAL | Status: DC | PRN

## 2017-10-30 MED ORDER — TRANEXAMIC ACID 1000 MG/10ML IV SOLN
INTRAVENOUS | Status: AC | PRN
  Administered 2017-10-30: 2000 mg via TOPICAL

## 2017-10-30 MED ORDER — DEXAMETHASONE SODIUM PHOSPHATE 10 MG/ML IJ SOLN
10.0000 mg | Freq: Once | INTRAMUSCULAR | Status: AC
  Administered 2017-10-31: 10 mg via INTRAVENOUS
  Filled 2017-10-30: qty 1

## 2017-10-30 MED ORDER — BUPIVACAINE-EPINEPHRINE (PF) 0.25% -1:200000 IJ SOLN
INTRAMUSCULAR | Status: AC
  Filled 2017-10-30: qty 30

## 2017-10-30 MED ORDER — PHENYLEPHRINE HCL 10 MG/ML IJ SOLN
INTRAVENOUS | Status: DC | PRN
  Administered 2017-10-30: 30 ug/min via INTRAVENOUS

## 2017-10-30 MED ORDER — ROPIVACAINE HCL 7.5 MG/ML IJ SOLN
INTRAMUSCULAR | Status: DC | PRN
  Administered 2017-10-30: 20 mL via PERINEURAL

## 2017-10-30 MED ORDER — OLOPATADINE HCL 0.1 % OP SOLN
1.0000 [drp] | OPHTHALMIC | Status: DC | PRN
Start: 2017-10-30 — End: 2017-10-31

## 2017-10-30 MED ORDER — FENTANYL CITRATE (PF) 100 MCG/2ML IJ SOLN
INTRAMUSCULAR | Status: AC
  Filled 2017-10-30: qty 2

## 2017-10-30 MED ORDER — ALUM & MAG HYDROXIDE-SIMETH 200-200-20 MG/5ML PO SUSP: 15.0000 mL | ORAL | Status: DC | PRN

## 2017-10-30 MED ORDER — TRANEXAMIC ACID 1000 MG/10ML IV SOLN
2000.0000 mg | Freq: Once | INTRAVENOUS | Status: DC
  Filled 2017-10-30: qty 20

## 2017-10-30 MED ORDER — ONDANSETRON HCL 4 MG/2ML IJ SOLN: 4.0000 mg | Freq: Once | INTRAMUSCULAR | Status: DC | PRN

## 2017-10-30 MED ORDER — FENTANYL CITRATE (PF) 100 MCG/2ML IJ SOLN: 50.0000 ug | INTRAMUSCULAR | Status: DC

## 2017-10-30 MED ORDER — APIXABAN 2.5 MG PO TABS
2.5000 mg | ORAL_TABLET | Freq: Two times a day (BID) | ORAL | Status: DC
  Administered 2017-10-31: 2.5 mg via ORAL
  Filled 2017-10-30: qty 1

## 2017-10-30 MED ORDER — MAGNESIUM CITRATE PO SOLN: 1.0000 | Freq: Once | ORAL | Status: DC | PRN

## 2017-10-30 MED ORDER — BUPIVACAINE-EPINEPHRINE (PF) 0.25% -1:200000 IJ SOLN
INTRAMUSCULAR | Status: DC | PRN
  Administered 2017-10-30: 30 mL

## 2017-10-30 MED ORDER — ACETAMINOPHEN 650 MG RE SUPP: 650.0000 mg | RECTAL | Status: DC | PRN

## 2017-10-30 MED ORDER — LORAZEPAM 0.5 MG PO TABS: 0.5000 mg | ORAL_TABLET | Freq: Two times a day (BID) | ORAL | Status: DC | PRN

## 2017-10-30 MED ORDER — DIPHENHYDRAMINE HCL 12.5 MG/5ML PO ELIX: 12.5000 mg | ORAL_SOLUTION | ORAL | Status: DC | PRN

## 2017-10-30 MED ORDER — DEXAMETHASONE SODIUM PHOSPHATE 10 MG/ML IJ SOLN
10.0000 mg | Freq: Once | INTRAMUSCULAR | Status: AC
  Administered 2017-10-30: 10 mg via INTRAVENOUS

## 2017-10-30 MED ORDER — METHOCARBAMOL 500 MG PO TABS
500.0000 mg | ORAL_TABLET | Freq: Four times a day (QID) | ORAL | Status: DC | PRN
  Administered 2017-10-31 (×2): 500 mg via ORAL
  Filled 2017-10-30 (×2): qty 1

## 2017-10-30 MED ORDER — LACTATED RINGERS IV SOLN
INTRAVENOUS | Status: DC
  Administered 2017-10-30 (×2): via INTRAVENOUS

## 2017-10-30 MED ORDER — DOCUSATE SODIUM 100 MG PO CAPS
100.0000 mg | ORAL_CAPSULE | Freq: Two times a day (BID) | ORAL | Status: DC
  Administered 2017-10-30 – 2017-10-31 (×2): 100 mg via ORAL
  Filled 2017-10-30 (×2): qty 1

## 2017-10-30 MED ORDER — PHENYLEPHRINE HCL 10 MG/ML IJ SOLN
INTRAMUSCULAR | Status: DC | PRN
  Administered 2017-10-30 (×2): 80 ug via INTRAVENOUS

## 2017-10-30 MED ORDER — SODIUM CHLORIDE 0.9 % IV SOLN
INTRAVENOUS | Status: DC
  Administered 2017-10-30 – 2017-10-31 (×2): via INTRAVENOUS

## 2017-10-30 MED ORDER — ZOLPIDEM TARTRATE 5 MG PO TABS
5.0000 mg | ORAL_TABLET | Freq: Every evening | ORAL | Status: DC | PRN
  Administered 2017-10-30: 5 mg via ORAL
  Filled 2017-10-30: qty 1

## 2017-10-30 MED ORDER — NON FORMULARY: 20.0000 mg | Freq: Every day | Status: DC

## 2017-10-30 MED ORDER — DILTIAZEM HCL ER COATED BEADS 180 MG PO CP24
180.0000 mg | ORAL_CAPSULE | Freq: Every evening | ORAL | Status: DC
  Administered 2017-10-30: 180 mg via ORAL
  Filled 2017-10-30: qty 1

## 2017-10-30 MED ORDER — METOPROLOL SUCCINATE ER 25 MG PO TB24
25.0000 mg | ORAL_TABLET | Freq: Every day | ORAL | Status: DC
  Filled 2017-10-30 (×2): qty 1

## 2017-10-30 MED ORDER — HYDROMORPHONE HCL 2 MG PO TABS: 4.0000 mg | ORAL_TABLET | ORAL | Status: DC | PRN

## 2017-10-30 MED ORDER — ATORVASTATIN CALCIUM 20 MG PO TABS
20.0000 mg | ORAL_TABLET | Freq: Every evening | ORAL | Status: DC
  Administered 2017-10-30: 20 mg via ORAL
  Filled 2017-10-30: qty 1

## 2017-10-30 MED ORDER — MIDAZOLAM HCL 2 MG/2ML IJ SOLN
INTRAMUSCULAR | Status: AC
  Administered 2017-10-30: 2 mg via INTRAVENOUS
  Filled 2017-10-30: qty 2

## 2017-10-30 MED ORDER — ONDANSETRON HCL 4 MG/2ML IJ SOLN
4.0000 mg | Freq: Four times a day (QID) | INTRAMUSCULAR | Status: DC | PRN
  Administered 2017-10-30: 4 mg via INTRAVENOUS
  Filled 2017-10-30: qty 2

## 2017-10-30 MED ORDER — CEFAZOLIN SODIUM-DEXTROSE 2-4 GM/100ML-% IV SOLN
INTRAVENOUS | Status: AC
  Filled 2017-10-30: qty 100

## 2017-10-30 MED ORDER — PROPOFOL 500 MG/50ML IV EMUL
INTRAVENOUS | Status: DC | PRN
  Administered 2017-10-30: 75 ug/kg/min via INTRAVENOUS

## 2017-10-30 MED ORDER — PHENOL 1.4 % MT LIQD: 1.0000 | OROMUCOSAL | Status: DC | PRN

## 2017-10-30 MED ORDER — CEFAZOLIN SODIUM-DEXTROSE 2-4 GM/100ML-% IV SOLN
2.0000 g | Freq: Four times a day (QID) | INTRAVENOUS | Status: AC
  Administered 2017-10-30 (×2): 2 g via INTRAVENOUS
  Filled 2017-10-30 (×2): qty 100

## 2017-10-30 MED ORDER — DOCUSATE SODIUM 100 MG PO CAPS: 100.0000 mg | ORAL_CAPSULE | Freq: Two times a day (BID) | ORAL | 0 refills | Status: DC

## 2017-10-30 SURGICAL SUPPLY — 46 items
BAG DECANTER FOR FLEXI CONT (MISCELLANEOUS) IMPLANT
BAG SPEC THK2 15X12 ZIP CLS (MISCELLANEOUS)
BAG ZIPLOCK 12X15 (MISCELLANEOUS) IMPLANT
BANDAGE ACE 6X5 VEL STRL LF (GAUZE/BANDAGES/DRESSINGS) ×2 IMPLANT
BLADE SAW SGTL 11.0X1.19X90.0M (BLADE) IMPLANT
BLADE SAW SGTL 13.0X1.19X90.0M (BLADE) ×2 IMPLANT
BONE CEMENT GENTAMICIN (Cement) ×4 IMPLANT
BOWL SMART MIX CTS (DISPOSABLE) ×2 IMPLANT
CAPT KNEE TOTAL 3 ATTUNE ×2 IMPLANT
CEMENT BONE GENTAMICIN 40 (Cement) ×2 IMPLANT
COVER SURGICAL LIGHT HANDLE (MISCELLANEOUS) ×2 IMPLANT
CUFF TOURN SGL QUICK 34 (TOURNIQUET CUFF) ×1
CUFF TRNQT CYL 34X4X40X1 (TOURNIQUET CUFF) ×1 IMPLANT
DECANTER SPIKE VIAL GLASS SM (MISCELLANEOUS) ×2 IMPLANT
DERMABOND ADVANCED (GAUZE/BANDAGES/DRESSINGS) ×1
DERMABOND ADVANCED .7 DNX12 (GAUZE/BANDAGES/DRESSINGS) ×1 IMPLANT
DRAPE U-SHAPE 47X51 STRL (DRAPES) ×2 IMPLANT
DRESSING AQUACEL AG SP 3.5X10 (GAUZE/BANDAGES/DRESSINGS) ×1 IMPLANT
DRSG AQUACEL AG SP 3.5X10 (GAUZE/BANDAGES/DRESSINGS) ×2
DURAPREP 26ML APPLICATOR (WOUND CARE) ×4 IMPLANT
ELECT REM PT RETURN 15FT ADLT (MISCELLANEOUS) ×2 IMPLANT
GLOVE BIOGEL PI IND STRL 7.5 (GLOVE) ×5 IMPLANT
GLOVE BIOGEL PI IND STRL 8.5 (GLOVE) ×1 IMPLANT
GLOVE BIOGEL PI INDICATOR 7.5 (GLOVE) ×5
GLOVE BIOGEL PI INDICATOR 8.5 (GLOVE) ×1
GLOVE ECLIPSE 8.0 STRL XLNG CF (GLOVE) ×4 IMPLANT
GLOVE ORTHO TXT STRL SZ7.5 (GLOVE) ×2 IMPLANT
GOWN STRL REUS W/TWL LRG LVL3 (GOWN DISPOSABLE) ×6 IMPLANT
GOWN STRL REUS W/TWL XL LVL3 (GOWN DISPOSABLE) ×2 IMPLANT
HANDPIECE INTERPULSE COAX TIP (DISPOSABLE) ×1
MANIFOLD NEPTUNE II (INSTRUMENTS) ×2 IMPLANT
PACK TOTAL KNEE CUSTOM (KITS) ×2 IMPLANT
POSITIONER SURGICAL ARM (MISCELLANEOUS) ×2 IMPLANT
SET HNDPC FAN SPRY TIP SCT (DISPOSABLE) ×1 IMPLANT
SET PAD KNEE POSITIONER (MISCELLANEOUS) ×2 IMPLANT
SUT MNCRL AB 4-0 PS2 18 (SUTURE) ×2 IMPLANT
SUT STRATAFIX 0 PDS 27 VIOLET (SUTURE) ×2
SUT VIC AB 1 CT1 36 (SUTURE) ×2 IMPLANT
SUT VIC AB 2-0 CT1 27 (SUTURE) ×3
SUT VIC AB 2-0 CT1 TAPERPNT 27 (SUTURE) ×3 IMPLANT
SUTURE STRATFX 0 PDS 27 VIOLET (SUTURE) ×1 IMPLANT
SYR 50ML LL SCALE MARK (SYRINGE) ×2 IMPLANT
TRAY FOLEY W/METER SILVER 16FR (SET/KITS/TRAYS/PACK) ×2 IMPLANT
WATER STERILE IRR 1000ML POUR (IV SOLUTION) ×2 IMPLANT
WRAP KNEE MAXI GEL POST OP (GAUZE/BANDAGES/DRESSINGS) ×2 IMPLANT
YANKAUER SUCT BULB TIP 10FT TU (MISCELLANEOUS) ×2 IMPLANT

## 2017-10-30 NOTE — Progress Notes (Signed)
AssistedDr. Ellender with right, ultrasound guided, adductor canal block. Side rails up, monitors on throughout procedure. See vital signs in flow sheet. Tolerated Procedure well.  

## 2017-10-30 NOTE — Anesthesia Procedure Notes (Signed)
Spinal  Patient location during procedure: OR Start time: 10/30/2017 9:10 AM End time: 10/30/2017 9:20 AM Staffing Anesthesiologist: Murvin Natal, MD Performed: anesthesiologist  Preanesthetic Checklist Completed: patient identified, surgical consent, pre-op evaluation, timeout performed, IV checked, risks and benefits discussed and monitors and equipment checked Spinal Block Patient position: sitting Prep: DuraPrep Patient monitoring: cardiac monitor, continuous pulse ox and blood pressure Approach: midline Location: L4-5 Injection technique: single-shot Needle Needle type: Pencan  Needle gauge: 24 G Needle length: 9 cm Assessment Sensory level: T10 Additional Notes Functioning IV was confirmed and monitors were applied. Sterile prep and drape, including hand hygiene and sterile gloves were used. The patient was positioned and the spine was prepped. The skin was anesthetized with lidocaine.  Free flow of clear CSF was obtained prior to injecting local anesthetic into the CSF.  The spinal needle aspirated freely following injection.  The needle was carefully withdrawn.  The patient tolerated the procedure well.

## 2017-10-30 NOTE — Discharge Instructions (Addendum)
Information on my medicine - ELIQUIS (apixaban)  This medication education was reviewed with me or my healthcare representative as part of my discharge preparation.  Why was Eliquis prescribed for you? Eliquis was prescribed for you to reduce the risk of a blood clot forming that can cause a stroke if you have a medical condition called atrial fibrillation (a type of irregular heartbeat).  What do You need to know about Eliquis ? Take your Eliquis TWICE DAILY - one tablet in the morning and one tablet in the evening with or without food. If you have difficulty swallowing the tablet whole please discuss with your pharmacist how to take the medication safely.  Take Eliquis exactly as prescribed by your doctor and DO NOT stop taking Eliquis without talking to the doctor who prescribed the medication.  Stopping may increase your risk of developing a stroke.  Refill your prescription before you run out.  After discharge, you should have regular check-up appointments with your healthcare provider that is prescribing your Eliquis.  In the future your dose may need to be changed if your kidney function or weight changes by a significant amount or as you get older.  What do you do if you miss a dose? If you miss a dose, take it as soon as you remember on the same day and resume taking twice daily.  Do not take more than one dose of ELIQUIS at the same time to make up a missed dose.  Important Safety Information A possible side effect of Eliquis is bleeding. You should call your healthcare provider right away if you experience any of the following: ? Bleeding from an injury or your nose that does not stop. ? Unusual colored urine (red or dark brown) or unusual colored stools (red or black). ? Unusual bruising for unknown reasons. ? A serious fall or if you hit your head (even if there is no bleeding).  Some medicines may interact with Eliquis and might increase your risk of bleeding or clotting  while on Eliquis. To help avoid this, consult your healthcare provider or pharmacist prior to using any new prescription or non-prescription medications, including herbals, vitamins, non-steroidal anti-inflammatory drugs (NSAIDs) and supplements.  This website has more information on Eliquis (apixaban): http://www.eliquis.com/eliquis/home INSTRUCTIONS AFTER JOINT REPLACEMENT   o Remove items at home which could result in a fall. This includes throw rugs or furniture in walking pathways o ICE to the affected joint every three hours while awake for 30 minutes at a time, for at least the first 3-5 days, and then as needed for pain and swelling.  Continue to use ice for pain and swelling. You may notice swelling that will progress down to the foot and ankle.  This is normal after surgery.  Elevate your leg when you are not up walking on it.   o Continue to use the breathing machine you got in the hospital (incentive spirometer) which will help keep your temperature down.  It is common for your temperature to cycle up and down following surgery, especially at night when you are not up moving around and exerting yourself.  The breathing machine keeps your lungs expanded and your temperature down.   DIET:  As you were doing prior to hospitalization, we recommend a well-balanced diet.  DRESSING / WOUND CARE / SHOWERING  Keep the surgical dressing until follow up.  The dressing is water proof, so you can shower without any extra covering.  IF THE DRESSING FALLS OFF or the  wound gets wet inside, change the dressing with sterile gauze.  Please use good hand washing techniques before changing the dressing.  Do not use any lotions or creams on the incision until instructed by your surgeon.    ACTIVITY  o Increase activity slowly as tolerated, but follow the weight bearing instructions below.   o No driving for 6 weeks or until further direction given by your physician.  You cannot drive while taking  narcotics.  o No lifting or carrying greater than 10 lbs. until further directed by your surgeon. o Avoid periods of inactivity such as sitting longer than an hour when not asleep. This helps prevent blood clots.  o You may return to work once you are authorized by your doctor.     WEIGHT BEARING   Weight bearing as tolerated with assist device (walker, cane, etc) as directed, use it as long as suggested by your surgeon or therapist, typically at least 4-6 weeks.   EXERCISES  Results after joint replacement surgery are often greatly improved when you follow the exercise, range of motion and muscle strengthening exercises prescribed by your doctor. Safety measures are also important to protect the joint from further injury. Any time any of these exercises cause you to have increased pain or swelling, decrease what you are doing until you are comfortable again and then slowly increase them. If you have problems or questions, call your caregiver or physical therapist for advice.   Rehabilitation is important following a joint replacement. After just a few days of immobilization, the muscles of the leg can become weakened and shrink (atrophy).  These exercises are designed to build up the tone and strength of the thigh and leg muscles and to improve motion. Often times heat used for twenty to thirty minutes before working out will loosen up your tissues and help with improving the range of motion but do not use heat for the first two weeks following surgery (sometimes heat can increase post-operative swelling).   These exercises can be done on a training (exercise) mat, on the floor, on a table or on a bed. Use whatever works the best and is most comfortable for you.    Use music or television while you are exercising so that the exercises are a pleasant break in your day. This will make your life better with the exercises acting as a break in your routine that you can look forward to.   Perform all  exercises about fifteen times, three times per day or as directed.  You should exercise both the operative leg and the other leg as well.  Exercises include:    Quad Sets - Tighten up the muscle on the front of the thigh (Quad) and hold for 5-10 seconds.    Straight Leg Raises - With your knee straight (if you were given a brace, keep it on), lift the leg to 60 degrees, hold for 3 seconds, and slowly lower the leg.  Perform this exercise against resistance later as your leg gets stronger.   Leg Slides: Lying on your back, slowly slide your foot toward your buttocks, bending your knee up off the floor (only go as far as is comfortable). Then slowly slide your foot back down until your leg is flat on the floor again.   Angel Wings: Lying on your back spread your legs to the side as far apart as you can without causing discomfort.   Hamstring Strength:  Lying on your back, push your heel  against the floor with your leg straight by tightening up the muscles of your buttocks.  Repeat, but this time bend your knee to a comfortable angle, and push your heel against the floor.  You may put a pillow under the heel to make it more comfortable if necessary.   A rehabilitation program following joint replacement surgery can speed recovery and prevent re-injury in the future due to weakened muscles. Contact your doctor or a physical therapist for more information on knee rehabilitation.    CONSTIPATION  Constipation is defined medically as fewer than three stools per week and severe constipation as less than one stool per week.  Even if you have a regular bowel pattern at home, your normal regimen is likely to be disrupted due to multiple reasons following surgery.  Combination of anesthesia, postoperative narcotics, change in appetite and fluid intake all can affect your bowels.   YOU MUST use at least one of the following options; they are listed in order of increasing strength to get the job done.  They  are all available over the counter, and you may need to use some, POSSIBLY even all of these options:    Drink plenty of fluids (prune juice may be helpful) and high fiber foods Colace 100 mg by mouth twice a day  Senokot for constipation as directed and as needed Dulcolax (bisacodyl), take with full glass of water  Miralax (polyethylene glycol) once or twice a day as needed.  If you have tried all these things and are unable to have a bowel movement in the first 3-4 days after surgery call either your surgeon or your primary doctor.    If you experience loose stools or diarrhea, hold the medications until you stool forms back up.  If your symptoms do not get better within 1 week or if they get worse, check with your doctor.  If you experience "the worst abdominal pain ever" or develop nausea or vomiting, please contact the office immediately for further recommendations for treatment.   ITCHING:  If you experience itching with your medications, try taking only a single pain pill, or even half a pain pill at a time.  You can also use Benadryl over the counter for itching or also to help with sleep.   TED HOSE STOCKINGS:  Use stockings on both legs until for at least 2 weeks or as directed by physician office. They may be removed at night for sleeping.  MEDICATIONS:  See your medication summary on the After Visit Summary that nursing will review with you.  You may have some home medications which will be placed on hold until you complete the course of blood thinner medication.  It is important for you to complete the blood thinner medication as prescribed.  PRECAUTIONS:  If you experience chest pain or shortness of breath - call 911 immediately for transfer to the hospital emergency department.   If you develop a fever greater that 101 F, purulent drainage from wound, increased redness or drainage from wound, foul odor from the wound/dressing, or calf pain - CONTACT YOUR SURGEON.                                                    FOLLOW-UP APPOINTMENTS:  If you do not already have a post-op appointment, please call the office for an  appointment to be seen by your surgeon.  Guidelines for how soon to be seen are listed in your After Visit Summary, but are typically between 1-4 weeks after surgery.  OTHER INSTRUCTIONS:   Knee Replacement:  Do not place pillow under knee, focus on keeping the knee straight while resting.   MAKE SURE YOU:   Understand these instructions.   Get help right away if you are not doing well or get worse.    Thank you for letting us be a part of your medical care team.  It is a privilege we respect greatly.  We hope these instructions will help you stay on track for a fast and full recovery!

## 2017-10-30 NOTE — Evaluation (Signed)
Physical Therapy Evaluation Patient Details Name: Jeremy Tucker MRN: [id number] DOB: 08-14-1951 Today's Date: 10/30/2017   History of Present Illness  R TKA  Clinical Impression  Pt is s/p TKA resulting in the deficits listed below (see PT Problem List). Pt ambulated 54' with RW and performed TKA exercises with min assist. Good progress expected.  Pt will benefit from skilled PT to increase their independence and safety with mobility to allow discharge to the venue listed below.      Follow Up Recommendations DC plan and follow up therapy as arranged by surgeon    Equipment Recommendations  Rolling walker with 5" wheels    Recommendations for Other Services       Precautions / Restrictions        Mobility  Bed Mobility Overal bed mobility: Needs Assistance Bed Mobility: Supine to Sit     Supine to sit: Min assist     General bed mobility comments: assist for RLE  Transfers Overall transfer level: Needs assistance Equipment used: Rolling walker (2 wheeled) Transfers: Sit to/from Stand Sit to Stand: Min assist         General transfer comment: VCs hand placement  Ambulation/Gait Ambulation/Gait assistance: Min guard Ambulation Distance (Feet): 50 Feet Assistive device: Rolling walker (2 wheeled) Gait Pattern/deviations: Step-to pattern   Gait velocity interpretation: Below normal speed for age/gender General Gait Details: VCs sequencing, no LOB  Stairs            Wheelchair Mobility    Modified Rankin (Stroke Patients Only)       Balance Overall balance assessment: Modified Independent                                           Pertinent Vitals/Pain Pain Assessment: 0-10 Pain Score: 5  Pain Location: R knee  Pain Descriptors / Indicators: Sore;Aching Pain Intervention(s): Limited activity within patient's tolerance;Monitored during session;Premedicated before session;Repositioned;Ice applied    Home Living  Family/patient expects to be discharged to:: Private residence Living Arrangements: Spouse/significant other Available Help at Discharge: Family;Available 24 hours/day   Home Access: Stairs to enter Entrance Stairs-Rails: None Entrance Stairs-Number of Steps: 2 Home Layout: Two level;Able to live on main level with bedroom/bathroom Home Equipment: Walker - standard;Bedside commode;Cane - single point      Prior Function Level of Independence: Independent               Hand Dominance        Extremity/Trunk Assessment   Upper Extremity Assessment Upper Extremity Assessment: Overall WFL for tasks assessed    Lower Extremity Assessment Lower Extremity Assessment: RLE deficits/detail RLE Deficits / Details: 0-45* AAROM, SLR 3/5    Cervical / Trunk Assessment Cervical / Trunk Assessment: Normal  Communication   Communication: No difficulties  Cognition Arousal/Alertness: Awake/alert Behavior During Therapy: WFL for tasks assessed/performed Overall Cognitive Status: Within Functional Limits for tasks assessed                                        General Comments      Exercises Total Joint Exercises Ankle Circles/Pumps: AROM;Both;10 reps;Supine Quad Sets: AROM;Both;5 reps;Supine Heel Slides: AAROM;Right;10 reps;Supine Straight Leg Raises: AROM;Right;5 reps;Supine Long Arc Quad: AROM;Right;5 reps;Seated   Assessment/Plan    PT Assessment Patient needs continued PT  services  PT Problem List Decreased strength;Decreased range of motion;Decreased activity tolerance;Decreased mobility;Pain       PT Treatment Interventions Gait training;Therapeutic activities;Stair training;Therapeutic exercise;DME instruction;Patient/family education    PT Goals (Current goals can be found in the Care Plan section)  Acute Rehab PT Goals Patient Stated Goal: be able to walk PT Goal Formulation: With patient Time For Goal Achievement: 11/06/17 Potential to  Achieve Goals: Good    Frequency 7X/week   Barriers to discharge        Co-evaluation               AM-PAC PT "6 Clicks" Daily Activity  Outcome Measure Difficulty turning over in bed (including adjusting bedclothes, sheets and blankets)?: None Difficulty moving from lying on back to sitting on the side of the bed? : A Little Difficulty sitting down on and standing up from a chair with arms (e.g., wheelchair, bedside commode, etc,.)?: A Little Help needed moving to and from a bed to chair (including a wheelchair)?: A Little Help needed walking in hospital room?: A Little Help needed climbing 3-5 steps with a railing? : Total 6 Click Score: 17    End of Session Equipment Utilized During Treatment: Gait belt Activity Tolerance: Patient tolerated treatment well Patient left: in chair;with call bell/phone within reach;with chair alarm set Nurse Communication: Mobility status PT Visit Diagnosis: Muscle weakness (generalized) (M62.81);Difficulty in walking, not elsewhere classified (R26.2);Pain Pain - Right/Left: Right Pain - part of body: Knee    Time: 8850-2774 PT Time Calculation (min) (ACUTE ONLY): 19 min   Charges:   PT Evaluation $PT Eval Low Complexity: 1 Low     PT G Codes:   PT G-Codes **NOT FOR INPATIENT CLASS** Functional Assessment Tool Used: AM-PAC 6 Clicks Basic Mobility Functional Limitation: Mobility: Walking and moving around Mobility: Walking and Moving Around Current Status (J2878): At least 40 percent but less than 60 percent impaired, limited or restricted Mobility: Walking and Moving Around Goal Status [phone]): At least 20 percent but less than 40 percent impaired, limited or restricted      Philomena Doheny 10/30/2017, 4:43 PM [phone]

## 2017-10-30 NOTE — Anesthesia Postprocedure Evaluation (Signed)
Anesthesia Post Note  Patient: Jeremy Tucker  Procedure(s) Performed: RIGHT TOTAL KNEE ARTHROPLASTY (Right Knee)     Patient location during evaluation: PACU Anesthesia Type: Regional and Spinal Level of consciousness: oriented and awake and alert Pain management: pain level controlled Vital Signs Assessment: post-procedure vital signs reviewed and stable Respiratory status: spontaneous breathing, respiratory function stable and patient connected to nasal cannula oxygen Cardiovascular status: blood pressure returned to baseline and stable Postop Assessment: no headache, no backache, no apparent nausea or vomiting and spinal receding Anesthetic complications: no    Last Vitals:  Vitals:   10/30/17 1358 10/30/17 1400  BP: 121/62 120/61  Pulse: 62 61  Resp: 16 16  Temp: (!) 36.4 C   SpO2: 100% 100%    Last Pain:  Vitals:   10/30/17 1400  TempSrc: Oral  PainSc: (P) 0-No pain                 Ryan P Ellender

## 2017-10-30 NOTE — Op Note (Signed)
NAME:  Jeremy Tucker [facility]                      MEDICAL RECORD NO.:  [id number]                             FACILITY:  [facility]      PHYSICIAN:  Jeremy Tucker, M.D.  DATE OF BIRTH:  04/22/1951      DATE OF PROCEDURE:  10/30/2017                                     OPERATIVE REPORT         PREOPERATIVE DIAGNOSIS:  Right knee osteoarthritis.      POSTOPERATIVE DIAGNOSIS:  Right knee osteoarthritis.      FINDINGS:  The patient was noted to have complete loss of cartilage and   bone-on-bone arthritis with associated osteophytes in the medial and patellofemoral compartments of   the knee with a significant synovitis and associated effusion.      PROCEDURE:  Right total knee replacement.      COMPONENTS USED:  DePuy Attune rotating platform posterior stabilized knee   system, a size 6 femur, 6 tibia, size 7 mm PS AOX insert, and 38 anatomic patellar   button.      SURGEON:  Jeremy Tucker, M.D.      ASSISTANT:  Danae Orleans, PA-C.      ANESTHESIA:  Regional and Spinal.      SPECIMENS:  None.      COMPLICATION:  None.      DRAINS:  None.  EBL: <100cc      TOURNIQUET TIME:   Total Tourniquet Time Documented: Thigh (Right) - 25 minutes Total: Thigh (Right) - 25 minutes  .      The patient was stable to the recovery room.      INDICATION FOR PROCEDURE:  Jeremy Tucker is a 66 y.o. male patient of   mine.  The patient had been seen, evaluated, and treated conservatively in the   office with medication, activity modification, and injections.  The patient had   radiographic changes of bone-on-bone arthritis with endplate sclerosis and osteophytes noted.      The patient failed conservative measures including medication, injections, and activity modification, and at this point was ready for more definitive measures.   Based on the radiographic changes and failed conservative measures, the patient   decided to proceed with total knee replacement.  Risks of infection,   DVT,  component failure, need for revision surgery, postop course, and   expectations were all   discussed and reviewed.  Consent was obtained for benefit of pain   relief.      PROCEDURE IN DETAIL:  The patient was brought to the operative theater.   Once adequate anesthesia, preoperative antibiotics, 2 gm of Ancef, 10 mg of Decadron administered, the patient was positioned supine with the right thigh tourniquet placed.  The  right lower extremity was prepped and draped in sterile fashion.  A time-   out was performed identifying the patient, planned procedure, and   extremity.      The right lower extremity was placed in the [facility] leg holder.  The leg was   exsanguinated, tourniquet elevated to 250 mmHg.  A midline incision was   made followed by median parapatellar  arthrotomy.  Following initial   exposure, attention was first directed to the patella.  Precut   measurement was noted to be 26 mm.  I resected down to 15 mm and used a   38 anatomic patellar button to restore patellar height as well as cover the cut   surface.      The lug holes were drilled and a metal shim was placed to protect the   patella from retractors and saw blades.      At this point, attention was now directed to the femur.  The femoral   canal was opened with a drill, irrigated to try to prevent fat emboli.  An   intramedullary rod was passed at 5 degrees valgus, 9 mm of bone was   resected off the distal femur.  Following this resection, the tibia was   subluxated anteriorly.  Using the extramedullary guide, 2 mm of bone was resected off   the proximal medial tibia.  We confirmed the gap would be   stable medially and laterally with a size 5 spacer block as well as confirmed   the cut was perpendicular in the coronal plane, checking with an alignment rod.      Once this was done, I sized the femur to be a size 6 in the anterior-   posterior dimension, chose a standard component based on medial and   lateral  dimension.  The size 6 rotation block was then pinned in   position anterior referenced using the C-clamp to set rotation.  The   anterior, posterior, and  chamfer cuts were made without difficulty nor   notching making certain that I was along the anterior cortex to help   with flexion gap stability.      The final box cut was made off the lateral aspect of distal femur.      At this point, the tibia was sized to be a size 6, the size 6 tray was   then pinned in position through the medial third of the tubercle,   drilled, and keel punched.  Trial reduction was now carried with a 6 femur,  6 tibia, a size 6 then 7 mm PS insert, and the 38 anatomic patella botton.  The knee was brought to   extension, full extension with good flexion stability with the patella   tracking through the trochlea without application of pressure.  Given   all these findings then femoral lug holes were drilled and then the trial components removed.  Final components were   opened and cement was mixed.  The knee was irrigated with normal saline   solution and pulse lavage.  The synovial lining was   then injected with 30 cc of 0.25% Marcaine with epinephrine and 1 cc of Toradol plus 30 cc of NS for a total of 61 cc.      The knee was irrigated.  Final implants were then cemented onto clean and   dried cut surfaces of bone with the knee brought to extension with a size 7 mm PS trial insert.      Once the cement had fully cured, the excess cement was removed   throughout the knee.  I confirmed I was satisfied with the range of   motion and stability, and the final size 7 mm PS AOX insert was chosen.  It was   placed into the knee.      The tourniquet had been let down at 25 minutes.  No significant   hemostasis required.  The   extensor mechanism was then reapproximated using #1 Vicryl and #0 Stratafix sutureswith the knee in flexion.  Following closure of this layer we injected topical based 2 gm of Tranexamic  Acid.  The remaining wound was closed with 2-0 Vicryl and running 4-0 Monocryl. The knee was cleaned, dried, dressed sterilely using Dermabond and Aquacel dressing.  The patient was then brought to recovery room in stable condition, tolerating the procedure   well.   Please note that Physician Assistant, Danae Orleans, PA-C, was present for the entirety of the case, and was utilized for pre-operative positioning, peri-operative retractor management, general facilitation of the procedure.  He was also utilized for primary wound closure at the end of the case.              Jeremy Cassis Alvan Tucker, M.D.    10/30/2017 10:44 AM

## 2017-10-30 NOTE — Interval H&P Note (Signed)
History and Physical Interval Note:  10/30/2017 8:37 AM  Jeremy Tucker  has presented today for surgery, with the diagnosis of Right knee osteoarthritis  The various methods of treatment have been discussed with the patient and family. After consideration of risks, benefits and other options for treatment, the patient has consented to  Procedure(s) with comments: RIGHT TOTAL KNEE ARTHROPLASTY (Right) - 90 mins as a surgical intervention .  The patient's history has been reviewed, patient examined, no change in status, stable for surgery.  I have reviewed the patient's chart and labs.  Questions were answered to the patient's satisfaction.     Mauri Pole

## 2017-10-30 NOTE — Anesthesia Preprocedure Evaluation (Addendum)
Anesthesia Evaluation  Patient identified by MRN, date of birth, ID band Patient awake    Reviewed: Allergy & Precautions, NPO status , Patient's Chart, lab work & pertinent test results, reviewed documented beta blocker date and time   Airway Mallampati: II  TM Distance: >3 FB Neck ROM: Full    Dental no notable dental hx.    Pulmonary asthma , sleep apnea ,    Pulmonary exam normal breath sounds clear to auscultation       Cardiovascular hypertension, Pt. on medications and Pt. on home beta blockers Normal cardiovascular exam+ dysrhythmias Atrial Fibrillation + Valvular Problems/Murmurs (mild) AS  Rhythm:Regular Rate:Normal  ECG: SR, PAC's, rate 76  Sees cardiologist  ECHO: Compared to a prior echo in 2016, the LV is higher at 65-70%. There is still moderate aortic stenosis.  Battery status OK. Normal device function. No new symptom episodes, tachy episodes, brady, or pause episodes. 405 AF episodes (59.4% burden), +Eliquis, diltiazem, and metoprolol, avg V rates controlled. Monthly summary   Nuclear stress EF: 53%. Blood pressure demonstrated a normal response to exercise. There was no ST segment deviation noted during stress. No T wave inversion was noted during stress. The left ventricular ejection fraction is mildly decreased (45-54%). The study is normal. This is a low risk study.     Neuro/Psych PSYCHIATRIC DISORDERS Depression negative neurological ROS     GI/Hepatic Neg liver ROS, GERD  Medicated and Controlled,  Endo/Other  negative endocrine ROS  Renal/GU      Musculoskeletal  (+) Arthritis , Osteoarthritis,  Right knee osteoarthritis DVT   Abdominal (+) + obese,   Peds  Hematology Hyperlipidemia   Anesthesia Other Findings   Reproductive/Obstetrics                            Anesthesia Physical  Anesthesia Plan  ASA: III  Anesthesia Plan: Spinal and Regional    Post-op Pain Management:  Regional for Post-op pain   Induction: Intravenous  PONV Risk Score and Plan: 1 and Midazolam  Airway Management Planned: Natural Airway  Additional Equipment:   Intra-op Plan:   Post-operative Plan:   Informed Consent: I have reviewed the patients History and Physical, chart, labs and discussed the procedure including the risks, benefits and alternatives for the proposed anesthesia with the patient or authorized representative who has indicated his/her understanding and acceptance.   Dental advisory given  Plan Discussed with: CRNA  Anesthesia Plan Comments:         Anesthesia Quick Evaluation

## 2017-10-30 NOTE — Anesthesia Procedure Notes (Signed)
Procedure Name: MAC Date/Time: 10/30/2017 9:10 AM Performed by: West Pugh, CRNA Pre-anesthesia Checklist: Patient identified, Emergency Drugs available, Suction available, Patient being monitored and Timeout performed Patient Re-evaluated:Patient Re-evaluated prior to induction Oxygen Delivery Method: Nasal cannula Placement Confirmation: positive ETCO2 Dental Injury: Teeth and Oropharynx as per pre-operative assessment

## 2017-10-30 NOTE — Transfer of Care (Signed)
Immediate Anesthesia Transfer of Care Note  Patient: Jeremy Tucker  Procedure(s) Performed: RIGHT TOTAL KNEE ARTHROPLASTY (Right Knee)  Patient Location: PACU  Anesthesia Type:Spinal and MAC combined with regional for post-op pain  Level of Consciousness: awake, oriented and patient cooperative  Airway & Oxygen Therapy: Patient Spontanous Breathing and Patient connected to face mask oxygen  Post-op Assessment: Report given to RN and Post -op Vital signs reviewed and stable  Post vital signs: Reviewed and stable  Last Vitals:  Vitals:   10/30/17 0847 10/30/17 0851  BP:  117/85  Pulse: 73 (!) 35  Resp: (!) 22 (!) 21  Temp:    SpO2: 99% 94%    Last Pain:  Vitals:   10/30/17 0715  TempSrc: Oral         Complications: No apparent anesthesia complications

## 2017-10-30 NOTE — Anesthesia Procedure Notes (Signed)
Anesthesia Regional Block: Adductor canal block   Pre-Anesthetic Checklist: ,, timeout performed, Correct Patient, Correct Site, Correct Laterality, Correct Procedure,, site marked, risks and benefits discussed, Surgical consent,  Pre-op evaluation,  At surgeon's request and post-op pain management  Laterality: Right  Prep: chloraprep       Needles:  Injection technique: Single-shot  Needle Type: Echogenic Stimulator Needle     Needle Length: 9cm  Needle Gauge: 21     Additional Needles:   Procedures:,,,, ultrasound used (permanent image in chart),,,,  Narrative:  Start time: 10/30/2017 8:40 AM End time: 10/30/2017 8:50 AM Injection made incrementally with aspirations every 5 mL.  Performed by: Personally  Anesthesiologist: Murvin Natal, MD  Additional Notes: Functioning IV was confirmed and monitors were applied.  A 57mm 21ga Arrow echogenic stimulator needle was used. Sterile prep, hand hygiene and sterile gloves were used.  Negative aspiration and negative test dose prior to incremental administration of local anesthetic. The patient tolerated the procedure well.

## 2017-10-31 DIAGNOSIS — M1711 Unilateral primary osteoarthritis, right knee: Secondary | ICD-10-CM | POA: Diagnosis not present

## 2017-10-31 DIAGNOSIS — M109 Gout, unspecified: Secondary | ICD-10-CM | POA: Diagnosis not present

## 2017-10-31 LAB — BASIC METABOLIC PANEL
Anion gap: 8 (ref 5–15)
BUN: 13 mg/dL (ref 6–20)
CO2: 28 mmol/L (ref 22–32)
Calcium: 8.7 mg/dL — ABNORMAL LOW (ref 8.9–10.3)
Chloride: 100 mmol/L — ABNORMAL LOW (ref 101–111)
Creatinine, Ser: 0.72 mg/dL (ref 0.61–1.24)
GFR calc Af Amer: >60 mL/min (ref >60)
GFR calc non Af Amer: >60 mL/min (ref >60)
Glucose, Bld: 164 mg/dL — ABNORMAL HIGH (ref 65–99)
Potassium: 4.2 mmol/L (ref 3.5–5.1)
Sodium: 136 mmol/L (ref 135–145)

## 2017-10-31 LAB — CBC
HCT: 36.7 % — ABNORMAL LOW (ref 39.0–52.0)
Hemoglobin: 12.5 g/dL — ABNORMAL LOW (ref 13.0–17.0)
MCH: 31.3 pg (ref 26.0–34.0)
MCHC: 34.1 g/dL (ref 30.0–36.0)
MCV: 92 fL (ref 78.0–100.0)
Platelets: 191 10*3/uL (ref 150–400)
RBC: 3.99 MIL/uL — ABNORMAL LOW (ref 4.22–5.81)
RDW: 12.7 % (ref 11.5–15.5)
WBC: 14.1 10*3/uL — ABNORMAL HIGH (ref 4.0–10.5)

## 2017-10-31 LAB — GLUCOSE, CAPILLARY: Glucose-Capillary: 151 mg/dL — ABNORMAL HIGH (ref 65–99)

## 2017-10-31 NOTE — Progress Notes (Signed)
Discharge planning, no HH needs identified. Plan for OP PT, has DME but needs updated RW. Contacted [facility] to deliver to the room. [phone]

## 2017-10-31 NOTE — Discharge Summary (Signed)
Physician Discharge Summary  Patient ID: EMAD BRECHTEL MRN: [id number] DOB/AGE: 07-21-1951 66 y.o.  Admit date: 10/30/2017 Discharge date: 10/31/2017   Procedures:  Procedure(s) (LRB): RIGHT TOTAL KNEE ARTHROPLASTY (Right)  Attending Physician:  Dr. Paralee Cancel   Admission Diagnoses:   Right knee primary OA / pain  Discharge Diagnoses:  Principal Problem:   S/P right TKA  Past Medical History:  Diagnosis Date  . Aortic stenosis, mild   . BPH (benign prostatic hyperplasia)   . Bronchitis    patient mentions " i am prone to bronchitis"   . Controlled diabetes mellitus type 2 with complications ([place])   . Depression   . DJD (degenerative joint disease)   . DVT (deep venous thrombosis) ([place])    post op knee surgery in 2001 , behind calf  ; was resolved with blood thinners   . Dysrhythmia    Atrial fibrillation  . GERD (gastroesophageal reflux disease)   . Glucose intolerance (impaired glucose tolerance)   . Gout    denies  . Hyperlipidemia   . Hyperlipidemia associated with type 2 diabetes mellitus ([place])   . Hypertension    denies  . Hypertension associated with diabetes ([place])   . Kidney stones   . Obesity   . Persistent atrial fibrillation ([place])    a. CHADsVASc score at least 2  . Status post placement of implantable loop recorder 2 years ago ; 2016   left upper chest     HPI:    Jeremy Tucker, 66 y.o. male, has a history of pain and functional disability in the right knee due to arthritis and has failed non-surgical conservative treatments for greater than 12 weeks to includeNSAID's and/or analgesics, corticosteriod injections, viscosupplementation injections and activity modification.  Onset of symptoms was gradual, starting  years ago with gradually worsening course since that time. The patient noted prior procedures on the knee to include  arthroscopy on the right knee(s).  Patient currently rates pain in the right knee(s) at 9 out of 10 with activity. Patient  has worsening of pain with activity and weight bearing, pain that interferes with activities of daily living, pain with passive range of motion, crepitus and joint swelling.  Patient has evidence of periarticular osteophytes and joint space narrowing by imaging studies.  There is no active infection.   Risks, benefits and expectations were discussed with the patient.  Risks including but not limited to the risk of anesthesia, blood clots, nerve damage, blood vessel damage, failure of the prosthesis, infection and up to and including death.  Patient understand the risks, benefits and expectations and wishes to proceed with surgery.   PCP: Denita Lung, MD   Discharged Condition: good  Hospital Course:  Patient underwent the above stated procedure on 10/30/2017. Patient tolerated the procedure well and brought to the recovery room in good condition and subsequently to the floor.  POD #1 BP: 147/78 ; Pulse: 77 ; Temp: 98.7 F (37.1 C) ; Resp: 18 Patient reports pain as mild to moderate.  No events.  Up this am eating breakfast, feeling pretty good. Neurovascular intact and incision: dressing C/D/I.   LABS  Basename    HGB     12.5  HCT     36.7    Discharge Exam: General appearance: alert, cooperative and no distress Extremities: Homans sign is negative, no sign of DVT, no edema, redness or tenderness in the calves or thighs and no ulcers, gangrene or trophic changes  Disposition:  Home with follow up in 2 weeks   Follow-up Information    Paralee Cancel, MD. Schedule an appointment as soon as possible for a visit in 2 week(s).   Specialty:  Orthopedic Surgery Contact information: [address] [place] [address] [phone]           Discharge Instructions    Call MD / Call 911   Complete by:  As directed    If you experience chest pain or shortness of breath, CALL 911 and be transported to the hospital emergency room.  If you develope a fever above 101  F, pus (white drainage) or increased drainage or redness at the wound, or calf pain, call your surgeon's office.   Change dressing   Complete by:  As directed    Maintain surgical dressing until follow up in the clinic. If the edges start to pull up, may reinforce with tape. If the dressing is no longer working, may remove and cover with gauze and tape, but must keep the area dry and clean.  Call with any questions or concerns.   Constipation Prevention   Complete by:  As directed    Drink plenty of fluids.  Prune juice may be helpful.  You may use a stool softener, such as Colace (over the counter) 100 mg twice a day.  Use MiraLax (over the counter) for constipation as needed.   Diet - low sodium heart healthy   Complete by:  As directed    Discharge instructions   Complete by:  As directed    Maintain surgical dressing until follow up in the clinic. If the edges start to pull up, may reinforce with tape. If the dressing is no longer working, may remove and cover with gauze and tape, but must keep the area dry and clean.  Follow up in 2 weeks at [facility]. Call with any questions or concerns.   Increase activity slowly as tolerated   Complete by:  As directed    Weight bearing as tolerated with assist device (walker, cane, etc) as directed, use it as long as suggested by your surgeon or therapist, typically at least 4-6 weeks.   TED hose   Complete by:  As directed    Use stockings (TED hose) for 2 weeks on both leg(s).  You may remove them at night for sleeping.      Allergies as of 10/31/2017      Reactions   Codeine Itching   Oxycodone Itching      Medication List    TAKE these medications   acetaminophen 500 MG tablet Commonly known as:  TYLENOL Take 1,000 mg by mouth every 6 (six) hours as needed for headache.   albuterol 108 (90 Base) MCG/ACT inhaler Commonly known as:  PROVENTIL HFA;VENTOLIN HFA Inhale 2 puffs into the lungs every 6 (six) hours as needed for  wheezing or shortness of breath.   allopurinol 300 MG tablet Commonly known as:  ZYLOPRIM Take 300 mg by mouth daily.   ammonium lactate 12 % lotion Commonly known as:  LAC-HYDRIN Apply 1 application topically daily as needed for dry skin (3-4 TIMES WEEK).   apixaban 5 MG Tabs tablet Commonly known as:  ELIQUIS Take 1 tablet (5 mg total) by mouth 2 (two) times daily.   atorvastatin 20 MG tablet Commonly known as:  LIPITOR TAKE 1 TABLET BY MOUTH EVERY DAY What changed:    how much to take  how to take this  when  to take this   buPROPion 300 MG 24 hr tablet Commonly known as:  WELLBUTRIN XL Take 1 tablet (300 mg total) by mouth daily. What changed:  how much to take   busPIRone 10 MG tablet Commonly known as:  BUSPAR Take 10 mg by mouth daily.   diltiazem 180 MG 24 hr capsule Commonly known as:  CARDIZEM CD Take 1 capsule (180 mg total) by mouth daily. What changed:  when to take this   docusate sodium 100 MG capsule Commonly known as:  COLACE Take 1 capsule (100 mg total) by mouth 2 (two) times daily.   ferrous sulfate 325 (65 FE) MG tablet Commonly known as:  FERROUSUL Take 1 tablet (325 mg total) by mouth 3 (three) times daily with meals.   FISH OIL TRIPLE STRENGTH 1400 MG Caps Take 1,400 mg by mouth daily.   HYDROmorphone 2 MG tablet Commonly known as:  DILAUDID Take 1-2 tablets (2-4 mg total) by mouth every 6 (six) hours as needed for severe pain (breakthrough pain).   loratadine 10 MG tablet Commonly known as:  CLARITIN Take 10 mg by mouth daily.   LORazepam 0.5 MG tablet Commonly known as:  ATIVAN TAKE 1 TABLET TWICE DAILY AS NEEDED FOR ANXIETY.   methocarbamol 500 MG tablet Commonly known as:  ROBAXIN Take 1 tablet (500 mg total) by mouth every 6 (six) hours as needed for muscle spasms.   metoprolol succinate 25 MG 24 hr tablet Commonly known as:  TOPROL-XL TAKE 1 TABLET BY MOUTH DAILY What changed:    how much to take  how to take  this  when to take this   multivitamin with minerals tablet Take 1 tablet by mouth daily.   olopatadine 0.1 % ophthalmic solution Commonly known as:  PATANOL Place 1 drop into the left eye as needed (tearing).   omeprazole 20 MG capsule Commonly known as:  PRILOSEC Take 20 mg by mouth daily.   polyethylene glycol packet Commonly known as:  MIRALAX / GLYCOLAX Take 17 g by mouth 2 (two) times daily.   Potassium Citrate 15 MEQ (1620 MG) Tbcr Take 15 mEq by mouth daily.   tamsulosin 0.4 MG Caps capsule Commonly known as:  FLOMAX Take 0.4 mg by mouth at bedtime.   zolpidem 5 MG tablet Commonly known as:  AMBIEN Take 1 tablet (5 mg total) by mouth at bedtime as needed for sleep.            Durable Medical Equipment  (From admission, onward)        Start     Ordered   10/31/17 1035  For home use only DME Walker rolling  Once    Question:  Patient needs a walker to treat with the following condition  Answer:  Osteoarthrosis, localized, primary, knee, right   10/31/17 1035       Discharge Care Instructions  (From admission, onward)        Start     Ordered   10/31/17 0000  Change dressing    Comments:  Maintain surgical dressing until follow up in the clinic. If the edges start to pull up, may reinforce with tape. If the dressing is no longer working, may remove and cover with gauze and tape, but must keep the area dry and clean.  Call with any questions or concerns.   10/31/17 0913       Signed: West Pugh. Babish   PA-C  10/31/2017, 12:34 PM

## 2017-10-31 NOTE — Progress Notes (Signed)
Patient ID: Jeremy Tucker, male   DOB: 07-10-1951, 66 y.o.   MRN: [id number] Subjective: 1 Day Post-Op Procedure(s) (LRB): RIGHT TOTAL KNEE ARTHROPLASTY (Right)    Patient reports pain as mild to moderate.  No events.  Up this am eating breakfast, feeling pretty good  Objective:   VITALS:   Vitals:   10/31/17 0132 10/31/17 0550  BP: 139/73 (!) 147/78  Pulse: 86 77  Resp: 18 18  Temp: 98.6 F (37 C) 98.7 F (37.1 C)  SpO2: 98% 98%    Neurovascular intact Incision: dressing C/D/I  LABS Recent Labs    10/31/17 0515  HGB 12.5*  HCT 36.7*  WBC 14.1*  PLT 191    Recent Labs    10/31/17 0515  NA 136  K 4.2  BUN 13  CREATININE 0.72  GLUCOSE 164*    No results for input(s): LABPT, INR in the last 72 hours.   Assessment/Plan: 1 Day Post-Op Procedure(s) (LRB): RIGHT TOTAL KNEE ARTHROPLASTY (Right)   Advance diet Up with therapy  PT this am D/C to home after PT  Outpt PT already set up for this thursday

## 2017-10-31 NOTE — Evaluation (Signed)
Occupational Therapy Evaluation Patient Details Name: Jeremy Tucker MRN: [id number] DOB: Aug 22, 1951 Today's Date: 10/31/2017    History of Present Illness R TKA   Clinical Impression   This 66 year old man was admitted for the above sx. All education was completed. No further OT is needed at this time    Follow Up Recommendations  Supervision/Assistance - 24 hour    Equipment Recommendations  None recommended by OT    Recommendations for Other Services       Precautions / Restrictions Precautions Precautions: Fall;Knee Restrictions Weight Bearing Restrictions: No      Mobility Bed Mobility Overal bed mobility: Modified Independent                Transfers   Equipment used: Rolling walker (2 wheeled)   Sit to Stand: Supervision              Balance                                           ADL either performed or assessed with clinical judgement   ADL Overall ADL's : Needs assistance/impaired Eating/Feeding: Independent   Grooming: Supervision/safety;Standing   Upper Body Bathing: Set up;Sitting   Lower Body Bathing: Minimal assistance;Sit to/from stand   Upper Body Dressing : Set up;Sitting   Lower Body Dressing: Moderate assistance;Sit to/from stand   Toilet Transfer: Min guard;Ambulation;RW(back to bed)   Toileting- Clothing Manipulation and Hygiene: Supervision/safety;Sit to/from stand   Tub/ Shower Transfer: Walk-in shower;Min guard;Ambulation     General ADL Comments: pt donned clothing.  No  cues needed for safety walking to bathroom;' cues given for shower sequence. Reviewed precautions     Vision         Perception     Praxis      Pertinent Vitals/Pain Pain Score: 2  Pain Location: R knee  Pain Descriptors / Indicators: Sore Pain Intervention(s): Limited activity within patient's tolerance;Monitored during session;Premedicated before session;Repositioned;Ice applied     Hand Dominance      Extremity/Trunk Assessment Upper Extremity Assessment Upper Extremity Assessment: Overall WFL for tasks assessed           Communication Communication Communication: No difficulties   Cognition Arousal/Alertness: Awake/alert Behavior During Therapy: WFL for tasks assessed/performed Overall Cognitive Status: Within Functional Limits for tasks assessed                                     General Comments       Exercises     Shoulder Instructions      Home Living Family/patient expects to be discharged to:: Private residence Living Arrangements: Spouse/significant other Available Help at Discharge: Family;Available 24 hours/day               Bathroom Shower/Tub: Occupational psychologist: Standard     Home Equipment: Bedside commode;Shower seat          Prior Functioning/Environment Level of Independence: Independent                 OT Problem List:        OT Treatment/Interventions:      OT Goals(Current goals can be found in the care plan section) Acute Rehab OT Goals Patient Stated Goal: be able to walk OT Goal Formulation:  All assessment and education complete, DC therapy  OT Frequency:     Barriers to D/C:            Co-evaluation              AM-PAC PT "6 Clicks" Daily Activity     Outcome Measure Help from another person eating meals?: None Help from another person taking care of personal grooming?: A Little Help from another person toileting, which includes using toliet, bedpan, or urinal?: A Little Help from another person bathing (including washing, rinsing, drying)?: A Little Help from another person to put on and taking off regular upper body clothing?: A Little Help from another person to put on and taking off regular lower body clothing?: A Lot 6 Click Score: 18   End of Session    Activity Tolerance: Patient tolerated treatment well Patient left: in bed;with call bell/phone within reach  OT  Visit Diagnosis: Pain Pain - Right/Left: Right Pain - part of body: Knee                Time: 1638-4536 OT Time Calculation (min): 14 min Charges:  OT General Charges $OT Visit: 1 Visit OT Evaluation $OT Eval Low Complexity: 1 Low G-Codes: OT G-codes **NOT FOR INPATIENT CLASS** Functional Assessment Tool Used: Clinical judgement Functional Limitation: Self care Self Care Current Status (I6803): At least 20 percent but less than 40 percent impaired, limited or restricted Self Care Goal Status (O1224): At least 20 percent but less than 40 percent impaired, limited or restricted Self Care Discharge Status [phone]): At least 20 percent but less than 40 percent impaired, limited or restricted   Lesle Chris, OTR/L [phone] 10/31/2017  Tucker,Jeremy 10/31/2017, 9:52 AM

## 2017-10-31 NOTE — Progress Notes (Signed)
Physical Therapy Treatment Patient Details Name: Jeremy Tucker MRN: [id number] DOB: 10/03/1951 Today's Date: 10/31/2017    History of Present Illness R TKA    PT Comments    Pt ambulated in hallway and performed safe stair technique.  Pt also performed LE exercises.  Pt provided with handouts on stair technique and HEP.  Pt had no further questions and feels ready for d/c home today.   Follow Up Recommendations  DC plan and follow up therapy as arranged by surgeon     Equipment Recommendations  Rolling walker with 5" wheels    Recommendations for Other Services       Precautions / Restrictions Precautions Precautions: Fall;Knee Restrictions Weight Bearing Restrictions: No    Mobility  Bed Mobility Overal bed mobility: Modified Independent                Transfers Overall transfer level: Needs assistance Equipment used: Rolling walker (2 wheeled) Transfers: Sit to/from Stand Sit to Stand: Supervision         General transfer comment: VCs hand placement  Ambulation/Gait Ambulation/Gait assistance: Supervision Ambulation Distance (Feet): 160 Feet Assistive device: Rolling walker (2 wheeled) Gait Pattern/deviations: Step-to pattern;Decreased stance time - right;Antalgic     General Gait Details: verbal cues for RW positioning   Stairs Stairs: Yes   Stair Management: Backwards;With walker;Step to pattern Number of Stairs: 2 General stair comments: verbal cues for safety, sequence, RW positioning, pt reports understanding, provided handout (spouse not present for session)  Wheelchair Mobility    Modified Rankin (Stroke Patients Only)       Balance                                            Cognition Arousal/Alertness: Awake/alert Behavior During Therapy: WFL for tasks assessed/performed Overall Cognitive Status: Within Functional Limits for tasks assessed                                        Exercises  Total Joint Exercises Ankle Circles/Pumps: AROM;Both;10 reps;Supine Quad Sets: AROM;Both;Supine;10 reps Short Arc Quad: AROM;Right;10 reps Heel Slides: AAROM;Right;10 reps Hip ABduction/ADduction: AROM;Right;10 reps Straight Leg Raises: AROM;Right;10 reps    General Comments        Pertinent Vitals/Pain Pain Assessment: 0-10 Pain Score: 2  Pain Location: R knee  Pain Descriptors / Indicators: Sore Pain Intervention(s): Repositioned;Limited activity within patient's tolerance;Monitored during session;Ice applied    Home Living Family/patient expects to be discharged to:: Private residence Living Arrangements: Spouse/significant other Available Help at Discharge: Family;Available 24 hours/day         Home Equipment: Bedside commode;Shower seat      Prior Function Level of Independence: Independent          PT Goals (current goals can now be found in the care plan section) Acute Rehab PT Goals Patient Stated Goal: be able to walk Progress towards PT goals: Progressing toward goals    Frequency    7X/week      PT Plan Current plan remains appropriate    Co-evaluation              AM-PAC PT "6 Clicks" Daily Activity  Outcome Measure  Difficulty turning over in bed (including adjusting bedclothes, sheets and blankets)?: None Difficulty moving from lying on back  to sitting on the side of the bed? : None Difficulty sitting down on and standing up from a chair with arms (e.g., wheelchair, bedside commode, etc,.)?: None Help needed moving to and from a bed to chair (including a wheelchair)?: A Little Help needed walking in hospital room?: A Little Help needed climbing 3-5 steps with a railing? : A Little 6 Click Score: 21    End of Session Equipment Utilized During Treatment: Gait belt Activity Tolerance: Patient tolerated treatment well Patient left: with call bell/phone within reach;in bed   PT Visit Diagnosis: Difficulty in walking, not elsewhere  classified (R26.2)     Time: 9937-1696 PT Time Calculation (min) (ACUTE ONLY): 17 min  Charges:  $Gait Training: 8-22 mins                    G Codes:       Carmelia Bake, PT, DPT 10/31/2017 Pager: [phone]  York Ram E 10/31/2017, 11:52 AM

## 2017-11-02 DIAGNOSIS — M1711 Unilateral primary osteoarthritis, right knee: Secondary | ICD-10-CM | POA: Diagnosis not present

## 2017-11-03 ENCOUNTER — Ambulatory Visit (INDEPENDENT_AMBULATORY_CARE_PROVIDER_SITE_OTHER): Payer: PPO | Admitting: *Deleted

## 2017-11-03 DIAGNOSIS — I480 Paroxysmal atrial fibrillation: Secondary | ICD-10-CM | POA: Diagnosis not present

## 2017-11-08 DIAGNOSIS — M1711 Unilateral primary osteoarthritis, right knee: Secondary | ICD-10-CM | POA: Diagnosis not present

## 2017-11-08 NOTE — Progress Notes (Signed)
Carelink Summary Report / Loop Recorder 

## 2017-11-10 DIAGNOSIS — M1711 Unilateral primary osteoarthritis, right knee: Secondary | ICD-10-CM | POA: Diagnosis not present

## 2017-11-14 DIAGNOSIS — K529 Noninfective gastroenteritis and colitis, unspecified: Secondary | ICD-10-CM

## 2017-11-14 DIAGNOSIS — R911 Solitary pulmonary nodule: Secondary | ICD-10-CM

## 2017-11-14 HISTORY — DX: Solitary pulmonary nodule: R91.1

## 2017-11-14 HISTORY — DX: Noninfective gastroenteritis and colitis, unspecified: K52.9

## 2017-11-15 DIAGNOSIS — M25561 Pain in right knee: Secondary | ICD-10-CM | POA: Diagnosis not present

## 2017-11-16 ENCOUNTER — Telehealth: Payer: Self-pay | Admitting: Cardiology

## 2017-11-16 LAB — CUP PACEART REMOTE DEVICE CHECK
Date Time Interrogation Session: 20181221230953
Implantable Pulse Generator Implant Date: 20161101

## 2017-11-16 NOTE — Telephone Encounter (Signed)
Spoke w/ pt and requested that he send a manual transmission b/c his home monitor has not updated in at least 14 days.   

## 2017-11-17 DIAGNOSIS — M25561 Pain in right knee: Secondary | ICD-10-CM | POA: Diagnosis not present

## 2017-11-17 DIAGNOSIS — M25569 Pain in unspecified knee: Secondary | ICD-10-CM | POA: Diagnosis not present

## 2017-11-21 DIAGNOSIS — M25561 Pain in right knee: Secondary | ICD-10-CM | POA: Diagnosis not present

## 2017-11-23 DIAGNOSIS — M25561 Pain in right knee: Secondary | ICD-10-CM | POA: Diagnosis not present

## 2017-11-27 DIAGNOSIS — M25561 Pain in right knee: Secondary | ICD-10-CM | POA: Diagnosis not present

## 2017-11-30 ENCOUNTER — Other Ambulatory Visit: Payer: Self-pay | Admitting: Interventional Cardiology

## 2017-11-30 DIAGNOSIS — M25561 Pain in right knee: Secondary | ICD-10-CM | POA: Diagnosis not present

## 2017-11-30 NOTE — Telephone Encounter (Signed)
Pt last saw Dr Tamala Julian on 08/31/17, last labs 10/31/17 Creat 0.72, age 67, weight 112.5kg, based on specified criteria pt is on appropriate dosage of Eliquis 5mg  BID.  Will refill rx.

## 2017-12-04 ENCOUNTER — Ambulatory Visit (INDEPENDENT_AMBULATORY_CARE_PROVIDER_SITE_OTHER): Payer: PPO | Admitting: *Deleted

## 2017-12-04 DIAGNOSIS — I480 Paroxysmal atrial fibrillation: Secondary | ICD-10-CM

## 2017-12-04 DIAGNOSIS — M25569 Pain in unspecified knee: Secondary | ICD-10-CM | POA: Diagnosis not present

## 2017-12-04 NOTE — Progress Notes (Signed)
Carelink Summary Report / Loop Recorder 

## 2017-12-06 DIAGNOSIS — M25561 Pain in right knee: Secondary | ICD-10-CM | POA: Diagnosis not present

## 2017-12-08 DIAGNOSIS — M25561 Pain in right knee: Secondary | ICD-10-CM | POA: Diagnosis not present

## 2017-12-10 ENCOUNTER — Other Ambulatory Visit: Payer: Self-pay | Admitting: Family Medicine

## 2017-12-11 ENCOUNTER — Encounter: Payer: PPO | Admitting: Internal Medicine

## 2017-12-11 ENCOUNTER — Ambulatory Visit: Payer: PPO | Admitting: Internal Medicine

## 2017-12-11 ENCOUNTER — Encounter: Payer: Self-pay | Admitting: Internal Medicine

## 2017-12-11 VITALS — BP 132/68 | HR 52 | Ht 72.0 in | Wt 240.0 lb

## 2017-12-11 DIAGNOSIS — G4733 Obstructive sleep apnea (adult) (pediatric): Secondary | ICD-10-CM | POA: Diagnosis not present

## 2017-12-11 DIAGNOSIS — I10 Essential (primary) hypertension: Secondary | ICD-10-CM | POA: Diagnosis not present

## 2017-12-11 DIAGNOSIS — I480 Paroxysmal atrial fibrillation: Secondary | ICD-10-CM | POA: Diagnosis not present

## 2017-12-11 DIAGNOSIS — I350 Nonrheumatic aortic (valve) stenosis: Secondary | ICD-10-CM

## 2017-12-11 LAB — CUP PACEART REMOTE DEVICE CHECK
Date Time Interrogation Session: 20190120230940
Implantable Pulse Generator Implant Date: 20161101

## 2017-12-11 MED ORDER — BUSPIRONE HCL 10 MG PO TABS: 10.0000 mg | ORAL_TABLET | Freq: Every day | ORAL | 2 refills | Status: DC

## 2017-12-11 NOTE — Patient Instructions (Addendum)
Medication Instructions:  Your physician recommends that you continue on your current medications as directed. Please refer to the Current Medication list given to you today.   Labwork: None ordered   Testing/Procedures: None ordered   Follow-Up:  Your physician wants you to follow-up in: 6 months with Doristine Devoid, NP  Please call our office to schedule the follow-up appointment.   Any Other Special Instructions Will Be Listed Below (If Applicable).     If you need a refill on your cardiac medications before your next appointment, please call your pharmacy.

## 2017-12-11 NOTE — Progress Notes (Signed)
PCP: Denita Lung, MD Primary Cardiologist: Dr Tamala Julian Primary EP: Dr [name]  Jeremy Tucker is a 67 y.o. male who presents today for routine electrophysiology followup.  Since last being seen in our clinic, the patient reports doing very well.  He is unaware of his afib.  He had sharp L shoulder pain this am x 2 minutes but denies SSCP.  Today, he denies symptoms of palpitations, exertional chest pain, shortness of breath,  lower extremity edema, dizziness, presyncope, or syncope.  The patient is otherwise without complaint today.   Past Medical History:  Diagnosis Date  . Aortic stenosis, mild   . BPH (benign prostatic hyperplasia)   . Bronchitis    patient mentions " i am prone to bronchitis"   . Controlled diabetes mellitus type 2 with complications ([place])   . Depression   . DJD (degenerative joint disease)   . DVT (deep venous thrombosis) ([place])    post op knee surgery in 2001 , behind calf  ; was resolved with blood thinners   . Dysrhythmia    Atrial fibrillation  . GERD (gastroesophageal reflux disease)   . Glucose intolerance (impaired glucose tolerance)   . Gout    denies  . Hyperlipidemia   . Hyperlipidemia associated with type 2 diabetes mellitus ([place])   . Hypertension    denies  . Hypertension associated with diabetes ([place])   . Kidney stones   . Obesity   . Persistent atrial fibrillation ([place])    a. CHADsVASc score at least 2  . Status post placement of implantable loop recorder 2 years ago ; 2016   left upper chest    Past Surgical History:  Procedure Laterality Date  . ATRIAL FIBRILLATION ABLATION N/A 03/12/2015   Procedure: ATRIAL FIBRILLATION ABLATION;  Surgeon: [name], MD;  Location: [facility] CATH LAB;  Service: Cardiovascular;  Laterality: N/A;  . CARDIAC CATHETERIZATION  1990's   "Dr. Melvern Banker", no blockages per pt  . CARDIOVERSION N/A 02/25/2015   Procedure: CARDIOVERSION;  Surgeon: Sueanne Margarita, MD;  Location: [place];  Service: Cardiovascular;   Laterality: N/A;  . COLONOSCOPY  2010   [facility]  . CYSTOSCOPY W/ STONE MANIPULATION  1980's   "couldn't get stone so they had to cut me open"  . CYSTOSCOPY WITH RETROGRADE PYELOGRAM, URETEROSCOPY AND STENT PLACEMENT Left 04/24/2015   Procedure: URETHRAL MEATAL DILATION, CYSTOSCOPY WITH LEFT RETROGRADE PYELOGRAM, LEFT URETEROSCOPY, STONE BASKET EXTRACTION,  LEFT DOUBLE J STENT PLACEMENT;  Surgeon: Carolan Clines, MD;  Location: [facility];  Service: Urology;  Laterality: Left;  . EP IMPLANTABLE DEVICE N/A 09/15/2015   Procedure: Loop Recorder Insertion;  Surgeon: [name], MD;  Location: [place] CV LAB;  Service: Cardiovascular;  Laterality: N/A;  . EYE SURGERY Bilateral 2016   cataract surgery with implant ; dr Gerald Stabs grote   . FOREARM FRACTURE SURGERY Right ~ 1961  . FRACTURE SURGERY    . HOLMIUM LASER APPLICATION Left 1/74/9449   Procedure: HOLMIUM LASER APPLICATION;  Surgeon: Carolan Clines, MD;  Location: [facility];  Service: Urology;  Laterality: Left;  . JOINT REPLACEMENT    . KIDNEY STONE SURGERY  1980's   "stone lodged in the duct; had to cut me open to get it"  . KNEE ARTHROSCOPY Bilateral "multiple times"  . TEE WITHOUT CARDIOVERSION N/A 03/12/2015   Procedure: TRANSESOPHAGEAL ECHOCARDIOGRAM (TEE);  Surgeon: Pixie Casino, MD;  Location: [facility] ENDOSCOPY;  Service: Cardiovascular;  Laterality: N/A;  . TONSILLECTOMY    . TOTAL  KNEE ARTHROPLASTY Left 2001   [facility]  . TOTAL KNEE ARTHROPLASTY Right 10/30/2017   Procedure: RIGHT TOTAL KNEE ARTHROPLASTY;  Surgeon: Paralee Cancel, MD;  Location: [facility];  Service: Orthopedics;  Laterality: Right;  90 mins    ROS- all systems are reviewed and negatives except as per HPI above  Current Outpatient Medications  Medication Sig Dispense Refill  . acetaminophen (TYLENOL) 500 MG tablet Take 1,000 mg by mouth every 6 (six) hours as needed for headache.     . albuterol (PROVENTIL HFA;VENTOLIN HFA) 108 (90 Base) MCG/ACT inhaler Inhale 2  puffs into the lungs every 6 (six) hours as needed for wheezing or shortness of breath. 1 Inhaler 0  . allopurinol (ZYLOPRIM) 300 MG tablet Take 300 mg by mouth daily.      Marland Kitchen ammonium lactate (LAC-HYDRIN) 12 % lotion Apply 1 application topically daily as needed for dry skin (3-4 TIMES WEEK).     . buPROPion (WELLBUTRIN XL) 300 MG 24 hr tablet Take 1 tablet (300 mg total) by mouth daily. (Patient taking differently: Take 150 mg by mouth daily. ) 90 tablet 0  . busPIRone (BUSPAR) 10 MG tablet Take 10 mg by mouth daily.  2  . diltiazem (CARDIZEM CD) 180 MG 24 hr capsule Take 1 capsule (180 mg total) by mouth daily. (Patient taking differently: Take 180 mg by mouth every evening. ) 180 capsule 2  . docusate sodium (COLACE) 100 MG capsule Take 1 capsule (100 mg total) by mouth 2 (two) times daily. 10 capsule 0  . ELIQUIS 5 MG TABS tablet TAKE 1 TABLET BY MOUTH TWICE DAILY 60 tablet 9  . ferrous sulfate (FERROUSUL) 325 (65 FE) MG tablet Take 1 tablet (325 mg total) by mouth 3 (three) times daily with meals.  3  . HYDROmorphone (DILAUDID) 2 MG tablet Take 1-2 tablets (2-4 mg total) by mouth every 6 (six) hours as needed for severe pain (breakthrough pain). 16 tablet 0  . loratadine (CLARITIN) 10 MG tablet Take 10 mg by mouth daily.    Marland Kitchen LORazepam (ATIVAN) 0.5 MG tablet TAKE 1 TABLET TWICE DAILY AS NEEDED FOR ANXIETY. 20 tablet 0  . methocarbamol (ROBAXIN) 500 MG tablet Take 1 tablet (500 mg total) by mouth every 6 (six) hours as needed for muscle spasms. 40 tablet 0  . metoprolol succinate (TOPROL-XL) 25 MG 24 hr tablet TAKE 1 TABLET BY MOUTH DAILY (Patient taking differently: TAKE 1 TABLET BY MOUTH DAILY IN EVENING) 90 tablet 2  . Multiple Vitamins-Minerals (MULTIVITAMIN WITH MINERALS) tablet Take 1 tablet by mouth daily.      Marland Kitchen olopatadine (PATANOL) 0.1 % ophthalmic solution Place 1 drop into the left eye as needed (tearing).    . Omega-3 Fatty Acids (FISH OIL TRIPLE STRENGTH) 1400 MG CAPS Take 1,400 mg  by mouth daily.     Marland Kitchen omeprazole (PRILOSEC) 20 MG capsule Take 20 mg by mouth daily.    . polyethylene glycol (MIRALAX / GLYCOLAX) packet Take 17 g by mouth 2 (two) times daily. 14 each 0  . Potassium Citrate 15 MEQ (1620 MG) TBCR Take 15 mEq by mouth daily.  11  . tamsulosin (FLOMAX) 0.4 MG CAPS capsule Take 0.4 mg by mouth at bedtime.    Marland Kitchen zolpidem (AMBIEN) 5 MG tablet Take 1 tablet (5 mg total) by mouth at bedtime as needed for sleep. 30 tablet 0  . atorvastatin (LIPITOR) 20 MG tablet TAKE 1 TABLET BY MOUTH EVERY DAY 90 tablet 0   No current  facility-administered medications for this visit.     Physical Exam: Vitals:   12/11/17 1153  BP: 132/68  Pulse: (!) 52  SpO2: 97%  Weight: 240 lb (108.9 kg)  Height: 6' (1.829 m)    GEN- The patient is well appearing, alert and oriented x 3 today.  Walks with a cane Head- normocephalic, atraumatic Eyes-  Sclera clear, conjunctiva pink Ears- hearing intact Oropharynx- clear Lungs- Clear to ausculation bilaterally, normal work of breathing Heart- Regular rate and rhythm, 2/6 SEM LUSB GI- soft, NT, ND, + BS Extremities- no clubbing, cyanosis, or edema  EKG tracing ordered today is personally reviewed and shows sinus rhythm, no ischemic symptoms  Assessment and Plan:  1. Paroxysmal atrial fibrillation Reasonably well controlled Symptoms are minimal ILR reviewed at length today.  Many episodes classified as afib appear to be sinus with PACs He is on eliquis No changes today  2. Moderate AS Stable  3. HTN Stable No change required today  4. OSA Not able to use CPAP  Follow-up with Dr Tamala Julian as scheduled Return to see Butch Penny in the AF clinic every 6 months [facility]  [name] MD, [facility] 12/11/2017 12:32 PM

## 2017-12-12 DIAGNOSIS — M25561 Pain in right knee: Secondary | ICD-10-CM | POA: Diagnosis not present

## 2017-12-12 DIAGNOSIS — M25569 Pain in unspecified knee: Secondary | ICD-10-CM | POA: Diagnosis not present

## 2017-12-12 NOTE — Addendum Note (Signed)
Addended by: Marlis Edelson C on: 12/12/2017 12:41 PM   Modules accepted: Orders

## 2017-12-13 DIAGNOSIS — Z471 Aftercare following joint replacement surgery: Secondary | ICD-10-CM | POA: Diagnosis not present

## 2017-12-13 DIAGNOSIS — Z96651 Presence of right artificial knee joint: Secondary | ICD-10-CM | POA: Diagnosis not present

## 2017-12-18 DIAGNOSIS — M25561 Pain in right knee: Secondary | ICD-10-CM | POA: Diagnosis not present

## 2017-12-21 DIAGNOSIS — M25561 Pain in right knee: Secondary | ICD-10-CM | POA: Diagnosis not present

## 2017-12-25 DIAGNOSIS — M25569 Pain in unspecified knee: Secondary | ICD-10-CM | POA: Diagnosis not present

## 2017-12-26 DIAGNOSIS — Z85828 Personal history of other malignant neoplasm of skin: Secondary | ICD-10-CM | POA: Diagnosis not present

## 2017-12-26 DIAGNOSIS — L720 Epidermal cyst: Secondary | ICD-10-CM | POA: Diagnosis not present

## 2017-12-26 DIAGNOSIS — L570 Actinic keratosis: Secondary | ICD-10-CM | POA: Diagnosis not present

## 2017-12-28 DIAGNOSIS — M25561 Pain in right knee: Secondary | ICD-10-CM | POA: Diagnosis not present

## 2017-12-28 DIAGNOSIS — M25569 Pain in unspecified knee: Secondary | ICD-10-CM | POA: Diagnosis not present

## 2018-01-01 DIAGNOSIS — M25569 Pain in unspecified knee: Secondary | ICD-10-CM | POA: Diagnosis not present

## 2018-01-02 ENCOUNTER — Ambulatory Visit (INDEPENDENT_AMBULATORY_CARE_PROVIDER_SITE_OTHER): Payer: PPO | Admitting: *Deleted

## 2018-01-02 DIAGNOSIS — I480 Paroxysmal atrial fibrillation: Secondary | ICD-10-CM

## 2018-01-04 DIAGNOSIS — M25561 Pain in right knee: Secondary | ICD-10-CM | POA: Diagnosis not present

## 2018-01-04 NOTE — Progress Notes (Signed)
Carelink Summary Report / Loop Recorder 

## 2018-01-08 DIAGNOSIS — M25561 Pain in right knee: Secondary | ICD-10-CM | POA: Diagnosis not present

## 2018-01-09 DIAGNOSIS — L0291 Cutaneous abscess, unspecified: Secondary | ICD-10-CM | POA: Diagnosis not present

## 2018-01-09 DIAGNOSIS — L720 Epidermal cyst: Secondary | ICD-10-CM | POA: Diagnosis not present

## 2018-01-11 DIAGNOSIS — M25561 Pain in right knee: Secondary | ICD-10-CM | POA: Diagnosis not present

## 2018-01-11 DIAGNOSIS — M25569 Pain in unspecified knee: Secondary | ICD-10-CM | POA: Diagnosis not present

## 2018-01-12 DIAGNOSIS — R351 Nocturia: Secondary | ICD-10-CM | POA: Diagnosis not present

## 2018-01-12 DIAGNOSIS — N401 Enlarged prostate with lower urinary tract symptoms: Secondary | ICD-10-CM | POA: Diagnosis not present

## 2018-01-12 DIAGNOSIS — N5201 Erectile dysfunction due to arterial insufficiency: Secondary | ICD-10-CM | POA: Diagnosis not present

## 2018-01-12 DIAGNOSIS — N200 Calculus of kidney: Secondary | ICD-10-CM | POA: Diagnosis not present

## 2018-01-19 ENCOUNTER — Other Ambulatory Visit: Payer: Self-pay | Admitting: Medical

## 2018-01-19 DIAGNOSIS — M25561 Pain in right knee: Secondary | ICD-10-CM | POA: Diagnosis not present

## 2018-01-19 NOTE — Telephone Encounter (Signed)
Is this ok to refill?  

## 2018-01-22 DIAGNOSIS — M25561 Pain in right knee: Secondary | ICD-10-CM | POA: Diagnosis not present

## 2018-01-22 DIAGNOSIS — Z961 Presence of intraocular lens: Secondary | ICD-10-CM | POA: Diagnosis not present

## 2018-01-25 ENCOUNTER — Ambulatory Visit (INDEPENDENT_AMBULATORY_CARE_PROVIDER_SITE_OTHER): Payer: PPO | Admitting: Family Medicine

## 2018-01-25 ENCOUNTER — Encounter: Payer: Self-pay | Admitting: Family Medicine

## 2018-01-25 VITALS — BP 130/84 | HR 89 | Temp 98.7°F | Ht 72.0 in | Wt 229.0 lb

## 2018-01-25 DIAGNOSIS — J4521 Mild intermittent asthma with (acute) exacerbation: Secondary | ICD-10-CM

## 2018-01-25 DIAGNOSIS — J069 Acute upper respiratory infection, unspecified: Secondary | ICD-10-CM | POA: Diagnosis not present

## 2018-01-25 NOTE — Patient Instructions (Signed)
Stay on the Asmanex regularly and use the albuterol 4 times per day if you need to.  Down the road if you need your albuterol more than twice a week during the day or twice a month at night and we will change If you get worse over the weekend call me.

## 2018-01-25 NOTE — Progress Notes (Signed)
   Subjective:    Patient ID: Jeremy Tucker, male    DOB: [birth year], 67 y.o.   MRN: [id number]  HPI He has a 3-day history of started with nasal congestion, rhinorrhea and occasional wheezing with coughing and slight chills.  No sore throat, earache.  He does have underlying allergies and asthma.  The cough has become slightly productive.   Review of Systems     Objective:   Physical Exam Alert and in no distress. Tympanic membranes and canals are normal. Pharyngeal area is normal. Neck is supple without adenopathy or thyromegaly. Cardiac exam shows a regular sinus rhythm without murmurs or gallops. Lungs show expiratory wheezing        Assessment & Plan:  Mild intermittent asthmatic bronchitis with acute exacerbation  Viral upper respiratory tract infection  Recommend supportive care for the URI.  Hopefully by the end of the week and he will feel better.  If he runs into trouble, he will call me.  Recommend he continue to use his albuterol inhaler.  He was comfortable with that.

## 2018-01-28 MED ORDER — AZITHROMYCIN 500 MG PO TABS: 500.0000 mg | ORAL_TABLET | Freq: Every day | ORAL | 0 refills | Status: DC

## 2018-01-28 NOTE — Progress Notes (Signed)
He continues to have difficulty with cough, congestion and wheezing.  The cough is productive.  I will call in azithromycin

## 2018-01-29 ENCOUNTER — Encounter: Payer: Self-pay | Admitting: Family Medicine

## 2018-01-29 DIAGNOSIS — M25561 Pain in right knee: Secondary | ICD-10-CM | POA: Diagnosis not present

## 2018-01-31 DIAGNOSIS — D485 Neoplasm of uncertain behavior of skin: Secondary | ICD-10-CM | POA: Diagnosis not present

## 2018-01-31 DIAGNOSIS — L720 Epidermal cyst: Secondary | ICD-10-CM | POA: Diagnosis not present

## 2018-02-01 ENCOUNTER — Telehealth: Payer: Self-pay | Admitting: Family Medicine

## 2018-02-01 DIAGNOSIS — M25561 Pain in right knee: Secondary | ICD-10-CM | POA: Diagnosis not present

## 2018-02-01 NOTE — Telephone Encounter (Signed)
[name]. Pt say he was sick and you informed him to call you. Pt says he saw you this weekend. Phone number is [phone], [phone]. Please advise. [place]

## 2018-02-01 NOTE — Telephone Encounter (Signed)
New Message  Pt verbalized wanting to speak to Jill Alexanders [phone] or cell-[phone].  Please f/u

## 2018-02-02 MED ORDER — AZITHROMYCIN 500 MG PO TABS: 500.0000 mg | ORAL_TABLET | Freq: Every day | ORAL | 0 refills | Status: DC

## 2018-02-02 NOTE — Addendum Note (Signed)
Addended by: Denita Lung on: 02/02/2018 08:19 AM   Modules accepted: Orders

## 2018-02-02 NOTE — Telephone Encounter (Signed)
He states that he is roughly 85% better but usually requires 2 doses of the azithromycin.  I will call it in the recommend he wait a day or 2 before he takes it again.

## 2018-02-05 ENCOUNTER — Telehealth: Payer: Self-pay | Admitting: Family Medicine

## 2018-02-05 ENCOUNTER — Ambulatory Visit (INDEPENDENT_AMBULATORY_CARE_PROVIDER_SITE_OTHER): Payer: PPO | Admitting: *Deleted

## 2018-02-05 DIAGNOSIS — I480 Paroxysmal atrial fibrillation: Secondary | ICD-10-CM | POA: Diagnosis not present

## 2018-02-05 DIAGNOSIS — M25561 Pain in right knee: Secondary | ICD-10-CM | POA: Diagnosis not present

## 2018-02-05 LAB — CUP PACEART REMOTE DEVICE CHECK
Date Time Interrogation Session: 20190221004045
Implantable Pulse Generator Implant Date: 20161101

## 2018-02-05 NOTE — Telephone Encounter (Signed)
Pt called and stated that he has finished Zpack and is feeling better. Pt can be reached at [phone].

## 2018-02-06 NOTE — Progress Notes (Signed)
Carelink Summary Report / Loop Recorder 

## 2018-02-07 ENCOUNTER — Telehealth: Payer: Self-pay | Admitting: Cardiology

## 2018-02-07 NOTE — Telephone Encounter (Signed)
LMOVM requesting that pt send manual transmission b/c home monitor has not updated in at least 14 days.    

## 2018-02-08 DIAGNOSIS — M25561 Pain in right knee: Secondary | ICD-10-CM | POA: Diagnosis not present

## 2018-02-12 ENCOUNTER — Encounter: Payer: Self-pay | Admitting: Family Medicine

## 2018-02-12 DIAGNOSIS — M25561 Pain in right knee: Secondary | ICD-10-CM | POA: Diagnosis not present

## 2018-02-15 DIAGNOSIS — M25561 Pain in right knee: Secondary | ICD-10-CM | POA: Diagnosis not present

## 2018-02-19 ENCOUNTER — Encounter: Payer: Self-pay | Admitting: Family Medicine

## 2018-02-19 ENCOUNTER — Ambulatory Visit (INDEPENDENT_AMBULATORY_CARE_PROVIDER_SITE_OTHER): Payer: PPO | Admitting: Family Medicine

## 2018-02-19 VITALS — BP 124/78 | HR 71 | Ht 72.0 in | Wt 236.4 lb

## 2018-02-19 DIAGNOSIS — I480 Paroxysmal atrial fibrillation: Secondary | ICD-10-CM

## 2018-02-19 DIAGNOSIS — Z96651 Presence of right artificial knee joint: Secondary | ICD-10-CM

## 2018-02-19 DIAGNOSIS — Z23 Encounter for immunization: Secondary | ICD-10-CM | POA: Diagnosis not present

## 2018-02-19 DIAGNOSIS — E669 Obesity, unspecified: Secondary | ICD-10-CM

## 2018-02-19 DIAGNOSIS — E1169 Type 2 diabetes mellitus with other specified complication: Secondary | ICD-10-CM

## 2018-02-19 DIAGNOSIS — M109 Gout, unspecified: Secondary | ICD-10-CM

## 2018-02-19 DIAGNOSIS — E1159 Type 2 diabetes mellitus with other circulatory complications: Secondary | ICD-10-CM

## 2018-02-19 DIAGNOSIS — I10 Essential (primary) hypertension: Secondary | ICD-10-CM

## 2018-02-19 DIAGNOSIS — E785 Hyperlipidemia, unspecified: Secondary | ICD-10-CM | POA: Diagnosis not present

## 2018-02-19 DIAGNOSIS — I152 Hypertension secondary to endocrine disorders: Secondary | ICD-10-CM

## 2018-02-19 DIAGNOSIS — E118 Type 2 diabetes mellitus with unspecified complications: Secondary | ICD-10-CM | POA: Diagnosis not present

## 2018-02-19 DIAGNOSIS — R972 Elevated prostate specific antigen [PSA]: Secondary | ICD-10-CM | POA: Diagnosis not present

## 2018-02-19 NOTE — Progress Notes (Signed)
  Subjective:    Patient ID: Jeremy Tucker, male    DOB: [birth year], 67 y.o.   MRN: [id number]  Jeremy Tucker is a 67 y.o. male who presents for follow-up of Type 2 diabetes mellitus.  Patient no checking home blood sugars.   Home blood sugar records: no How often is blood sugars being checked: not being checked Current symptoms/problems include none and have been unchanged. Daily foot checks: no  Any foot concerns: no Last eye exam: last month 2019 Exercise: Exercise is limited by Recent knee surgery.  He still does not have full extension but does have good flexion.. He had knee surgery several months ago and is still in rehab for that.  He is also followed by urology.  His most recent PSA was 4.3.  He continues on bupropion and is having no difficulty with that.  He is also taking allopurinol.  His allergies seem to be under good control.  He continues to be followed by cardiology for his underlying A. fib The following portions of the patient's history were reviewed and updated as appropriate: allergies, current medications, past medical history, past social history and problem list.  ROS as in subjective above.     Objective:    Physical Exam Alert and in no distress otherwise not examined. Foot exam normal  Lab Review Diabetic Labs Latest Ref Rng & Units 10/31/2017 10/26/2017 03/16/2017 08/16/2016 04/22/2015  HbA1c 4.8 - 5.6 % - 7.2(H) 6.9 6.5(H) -  Chol 125 - 200 mg/dL - - - 163 -  HDL >=40 mg/dL - - - 45 -  Calc LDL <130 mg/dL - - - 90 -  Triglycerides <150 mg/dL - - - 140 -  Creatinine 0.61 - 1.24 mg/dL 0.72 0.69 - 0.75 0.65  GFR >60.00 mL/min - - - - -   BP/Weight 01/25/2018 12/11/2017 10/31/2017 10/30/2017 93/71/6967  Systolic BP 893 810 175 - 102  Diastolic BP 84 68 73 - 79  Wt. (Lbs) 229 240 - 248 248  BMI 31.06 32.55 - 33.63 33.63   A1c is 7.2 Jeremy Tucker  reports that he has never smoked. He has never used smokeless tobacco. He reports that he drinks alcohol. He reports that  he does not use drugs.     Assessment & Plan:    Paroxysmal atrial fibrillation (HCC)  Controlled type 2 diabetes mellitus with complication, without long-term current use of insulin (HCC)  Hyperlipidemia associated with type 2 diabetes mellitus ([place]) - Plan: Lipid panel  Need for pneumococcal vaccine - Plan: Pneumococcal polysaccharide vaccine 23-valent greater than or equal to 2yo subcutaneous/IM  Gout, unspecified cause, unspecified chronicity, unspecified site  Obesity (BMI 30-39.9)  Status post total right knee replacement  Hypertension associated with diabetes ([place])  Elevated PSA   1. Rx changes: none 2. Education: Reviewed 'ABCs' of diabetes management (respective goals in parentheses):  A1C (<7), blood pressure (<130/80), and cholesterol (LDL <100). 3. Compliance at present is estimated to be fair. Efforts to improve compliance (if necessary) will be directed at increased exercise. 4. Follow up: 4 months Encouraged him to remain active.  Continue to work on full extension and flexion of his knee.  Continue on present medications.  Did encourage him to stay as physically active as possible.

## 2018-02-20 LAB — LIPID PANEL
Chol/HDL Ratio: 4.3 ratio (ref 0.0–5.0)
Cholesterol, Total: 194 mg/dL (ref 100–199)
HDL: 45 mg/dL (ref >39)
LDL Calculated: 97 mg/dL (ref 0–99)
Triglycerides: 261 mg/dL — ABNORMAL HIGH (ref 0–149)
VLDL Cholesterol Cal: 52 mg/dL — ABNORMAL HIGH (ref 5–40)

## 2018-02-20 MED ORDER — ATORVASTATIN CALCIUM 20 MG PO TABS: 20.0000 mg | ORAL_TABLET | Freq: Every day | ORAL | 3 refills | Status: DC

## 2018-02-20 NOTE — Addendum Note (Signed)
Addended by: Denita Lung on: 02/20/2018 08:13 AM   Modules accepted: Orders

## 2018-02-22 DIAGNOSIS — M25561 Pain in right knee: Secondary | ICD-10-CM | POA: Diagnosis not present

## 2018-02-26 ENCOUNTER — Other Ambulatory Visit: Payer: Self-pay | Admitting: Nurse Practitioner

## 2018-02-26 DIAGNOSIS — M25561 Pain in right knee: Secondary | ICD-10-CM | POA: Diagnosis not present

## 2018-03-06 DIAGNOSIS — M25561 Pain in right knee: Secondary | ICD-10-CM | POA: Diagnosis not present

## 2018-03-12 ENCOUNTER — Ambulatory Visit (INDEPENDENT_AMBULATORY_CARE_PROVIDER_SITE_OTHER): Payer: PPO | Admitting: *Deleted

## 2018-03-12 DIAGNOSIS — I480 Paroxysmal atrial fibrillation: Secondary | ICD-10-CM

## 2018-03-12 NOTE — Progress Notes (Signed)
Carelink Summary Report / Loop Recorder 

## 2018-03-13 ENCOUNTER — Telehealth: Payer: Self-pay | Admitting: Interventional Cardiology

## 2018-03-13 DIAGNOSIS — M25561 Pain in right knee: Secondary | ICD-10-CM | POA: Diagnosis not present

## 2018-03-13 LAB — CUP PACEART REMOTE DEVICE CHECK
Date Time Interrogation Session: 20190326004014
Implantable Pulse Generator Implant Date: 20161101

## 2018-03-13 NOTE — Telephone Encounter (Signed)
New message     Pt c/o of Chest Pain: STAT if CP now or developed within 24 hours  1. Are you having CP right now? NO  2. Are you experiencing any other symptoms (ex. SOB, nausea, vomiting, sweating)? NO  3. How long have you been experiencing CP? 2 DAYS  4. Is your CP continuous or coming and going? COMING AND GOING  5. Have you taken Nitroglycerin? NO ?

## 2018-03-13 NOTE — Telephone Encounter (Signed)
Pt states for 2 days he has been having a pain right in the area that his loop recorder was placed.  Comes and goes.  Comes on quickly and is gone in a couple of seconds.  Denies SOB, dizziness, sweating or other cardiac issues.  States he feels fine.  Goes to therapy and exercises regularly with no issues.  Actually had therapy today with no issues.  No vitals available.  Advised I would send message to Dr. Tamala Julian for review but this did not sound cardiac related.  Pt appreciative for call.

## 2018-03-16 NOTE — Telephone Encounter (Signed)
Informed pt of Dr. Thompson Caul recommendations.  Pt states he has had no further episodes.  Pt appreciative for call.

## 2018-03-16 NOTE — Telephone Encounter (Signed)
I don't think this is a concern but if continues, will need to speak to implanter.

## 2018-03-20 DIAGNOSIS — M25561 Pain in right knee: Secondary | ICD-10-CM | POA: Diagnosis not present

## 2018-03-23 DIAGNOSIS — M25561 Pain in right knee: Secondary | ICD-10-CM | POA: Diagnosis not present

## 2018-03-27 DIAGNOSIS — M25561 Pain in right knee: Secondary | ICD-10-CM | POA: Diagnosis not present

## 2018-03-30 DIAGNOSIS — M25561 Pain in right knee: Secondary | ICD-10-CM | POA: Diagnosis not present

## 2018-04-04 LAB — CUP PACEART REMOTE DEVICE CHECK
Date Time Interrogation Session: 20190428013602
Implantable Pulse Generator Implant Date: 20161101

## 2018-04-12 ENCOUNTER — Ambulatory Visit (INDEPENDENT_AMBULATORY_CARE_PROVIDER_SITE_OTHER): Payer: PPO | Admitting: *Deleted

## 2018-04-12 DIAGNOSIS — I480 Paroxysmal atrial fibrillation: Secondary | ICD-10-CM

## 2018-04-13 NOTE — Progress Notes (Signed)
Carelink Summary Report / Loop Recorder 

## 2018-04-19 ENCOUNTER — Other Ambulatory Visit (HOSPITAL_COMMUNITY): Payer: Self-pay | Admitting: Nurse Practitioner

## 2018-04-26 ENCOUNTER — Other Ambulatory Visit (HOSPITAL_COMMUNITY): Payer: Self-pay | Admitting: Nurse Practitioner

## 2018-05-07 LAB — CUP PACEART REMOTE DEVICE CHECK
Date Time Interrogation Session: 20190531020526
Implantable Pulse Generator Implant Date: 20161101

## 2018-05-15 ENCOUNTER — Ambulatory Visit (HOSPITAL_COMMUNITY)
Admission: RE | Admit: 2018-05-15 | Discharge: 2018-05-15 | Disposition: A | Discharge status: Home / Self Care | Payer: PPO | Source: Ambulatory Visit | Attending: Nurse Practitioner | Admitting: Nurse Practitioner

## 2018-05-15 ENCOUNTER — Encounter (HOSPITAL_COMMUNITY): Payer: Self-pay | Admitting: Nurse Practitioner

## 2018-05-15 ENCOUNTER — Ambulatory Visit (INDEPENDENT_AMBULATORY_CARE_PROVIDER_SITE_OTHER): Payer: PPO | Admitting: *Deleted

## 2018-05-15 VITALS — BP 134/82 | HR 60 | Ht 72.0 in | Wt 243.0 lb

## 2018-05-15 DIAGNOSIS — Z95818 Presence of other cardiac implants and grafts: Secondary | ICD-10-CM | POA: Diagnosis not present

## 2018-05-15 DIAGNOSIS — I10 Essential (primary) hypertension: Secondary | ICD-10-CM | POA: Diagnosis not present

## 2018-05-15 DIAGNOSIS — Z86718 Personal history of other venous thrombosis and embolism: Secondary | ICD-10-CM | POA: Diagnosis not present

## 2018-05-15 DIAGNOSIS — F329 Major depressive disorder, single episode, unspecified: Secondary | ICD-10-CM | POA: Diagnosis not present

## 2018-05-15 DIAGNOSIS — I350 Nonrheumatic aortic (valve) stenosis: Secondary | ICD-10-CM | POA: Diagnosis not present

## 2018-05-15 DIAGNOSIS — Z961 Presence of intraocular lens: Secondary | ICD-10-CM | POA: Insufficient documentation

## 2018-05-15 DIAGNOSIS — Z79899 Other long term (current) drug therapy: Secondary | ICD-10-CM | POA: Insufficient documentation

## 2018-05-15 DIAGNOSIS — M109 Gout, unspecified: Secondary | ICD-10-CM | POA: Diagnosis not present

## 2018-05-15 DIAGNOSIS — E785 Hyperlipidemia, unspecified: Secondary | ICD-10-CM | POA: Diagnosis not present

## 2018-05-15 DIAGNOSIS — Z885 Allergy status to narcotic agent status: Secondary | ICD-10-CM | POA: Insufficient documentation

## 2018-05-15 DIAGNOSIS — Z96653 Presence of artificial knee joint, bilateral: Secondary | ICD-10-CM | POA: Insufficient documentation

## 2018-05-15 DIAGNOSIS — E119 Type 2 diabetes mellitus without complications: Secondary | ICD-10-CM | POA: Insufficient documentation

## 2018-05-15 DIAGNOSIS — Z7901 Long term (current) use of anticoagulants: Secondary | ICD-10-CM | POA: Insufficient documentation

## 2018-05-15 DIAGNOSIS — Z87442 Personal history of urinary calculi: Secondary | ICD-10-CM | POA: Diagnosis not present

## 2018-05-15 DIAGNOSIS — Z9841 Cataract extraction status, right eye: Secondary | ICD-10-CM | POA: Insufficient documentation

## 2018-05-15 DIAGNOSIS — Z9842 Cataract extraction status, left eye: Secondary | ICD-10-CM | POA: Diagnosis not present

## 2018-05-15 DIAGNOSIS — Z6832 Body mass index (BMI) 32.0-32.9, adult: Secondary | ICD-10-CM | POA: Insufficient documentation

## 2018-05-15 DIAGNOSIS — I481 Persistent atrial fibrillation: Secondary | ICD-10-CM | POA: Insufficient documentation

## 2018-05-15 DIAGNOSIS — Z8249 Family history of ischemic heart disease and other diseases of the circulatory system: Secondary | ICD-10-CM | POA: Insufficient documentation

## 2018-05-15 DIAGNOSIS — N400 Enlarged prostate without lower urinary tract symptoms: Secondary | ICD-10-CM | POA: Diagnosis not present

## 2018-05-15 DIAGNOSIS — E669 Obesity, unspecified: Secondary | ICD-10-CM | POA: Insufficient documentation

## 2018-05-15 DIAGNOSIS — K219 Gastro-esophageal reflux disease without esophagitis: Secondary | ICD-10-CM | POA: Diagnosis not present

## 2018-05-15 DIAGNOSIS — I480 Paroxysmal atrial fibrillation: Secondary | ICD-10-CM

## 2018-05-15 NOTE — Progress Notes (Signed)
Patient ID: Jeremy Tucker, male   DOB: 1951/10/15, 67 y.o.   MRN: [id number]     Primary Care Physician: Denita Lung, MD Referring Physician: Dr. Marlis Edelson Jeremy Tucker is a 67 y.o. male with a h/o persistent afib, s/p ablation, 03/12/15, and recently had a linq monitor placed 09/2015, for some ongoing palpitations. Site healed and remote report showed PAC's but no significant afib burden. He still reports that he has some weak mornings and has to go back to bed. He was encouraged to send a report when that occurs again to see if rhythm has anything to do with these spells.  He f/u's in the afib clinic 2/20 and linq is interrogated and reviewed with Tomi Bamberger, [employer] rep. It appears that the majority of his irregular heart beat is PAC's and dicussed with pt. Overall, he is now retired 6 months and he and his wife are exercising more. He still has some days that he is more tired but cannot specifically correlate with an arrhythmia. He continues on DOAC, no bleeding issues.  F/u 09/22/16, he is doing great. No afib burden mentioned. He is walking regularly. Describes a shooting pain down left arm that lasts seconds, is not exertional. Pending f/u echo for mild aortic stenosis. Continues on Eliquis 5 mg bid without bleeding issues.  F/u in afib clinic, 6/18, for episodes of head not feeling quite right at times, "lightheaded" and his whole body will ache, which he notices first thing in the am and going back to sleep for a few hours will abate the symptoms.He Does not note palpitations with this. Denies snoring. When he checks V/S, BP, HR is ok. He recently saw Cecilie Kicks, NP and she ordered a stress test which was low risk. Paceart reads out 43% afib burden but he is having bigeminal PAC's(chronic) which is being erroneously interpreted as afib, industry rep agrees. I had [employer] interrogate in the office today , noting dates in the am on 6/6, 6/5 and 6/11. There were no arrhythmia's identified. Pt  also has been sending a remote daily as he was confused how to transmit and [employer] rep informed pt of correct way to transmit. He has noted that BP is running lower than it has in the past.Continues on eliquis, no bleeding issues. Moderate AS was found to be stable on repeat echo last November.  F/u in afib clinic, 7/2. He reports that he is doing great. Not aware of any irregular HB. Had his knee replaced in December and rehab went well and is back to walking daily. No issues with blood thinner. Has moderate AS and is being followed by Dr. Tamala Julian.  Today, he denies symptoms of palpitations, chest pain, shortness of breath, orthopnea, PND, lower extremity edema, dizziness, presyncope, syncope, or neurologic sequela. The patient is tolerating medications without difficulties and is otherwise without complaint today.   Past Medical History:  Diagnosis Date  . Aortic stenosis, mild   . BPH (benign prostatic hyperplasia)   . Bronchitis    patient mentions " i am prone to bronchitis"   . Controlled diabetes mellitus type 2 with complications ([place])   . Depression   . DJD (degenerative joint disease)   . DVT (deep venous thrombosis) ([place])    post op knee surgery in 2001 , behind calf  ; was resolved with blood thinners   . Dysrhythmia    Atrial fibrillation  . GERD (gastroesophageal reflux disease)   . Glucose intolerance (impaired glucose tolerance)   .  Gout    denies  . Hyperlipidemia   . Hyperlipidemia associated with type 2 diabetes mellitus ([place])   . Hypertension    denies  . Hypertension associated with diabetes ([place])   . Kidney stones   . Obesity   . Persistent atrial fibrillation ([place])    a. CHADsVASc score at least 2  . Status post placement of implantable loop recorder 2 years ago ; 2016   left upper chest    Past Surgical History:  Procedure Laterality Date  . ATRIAL FIBRILLATION ABLATION N/A 03/12/2015   Procedure: ATRIAL FIBRILLATION ABLATION;  Surgeon: [name], MD;   Location: [facility] CATH LAB;  Service: Cardiovascular;  Laterality: N/A;  . CARDIAC CATHETERIZATION  1990's   "Dr. Melvern Banker", no blockages per pt  . CARDIOVERSION N/A 02/25/2015   Procedure: CARDIOVERSION;  Surgeon: Sueanne Margarita, MD;  Location: [facility];  Service: Cardiovascular;  Laterality: N/A;  . COLONOSCOPY  2010   [facility]  . CYSTOSCOPY W/ STONE MANIPULATION  1980's   "couldn't get stone so they had to cut me open"  . CYSTOSCOPY WITH RETROGRADE PYELOGRAM, URETEROSCOPY AND STENT PLACEMENT Left 04/24/2015   Procedure: URETHRAL MEATAL DILATION, CYSTOSCOPY WITH LEFT RETROGRADE PYELOGRAM, LEFT URETEROSCOPY, STONE BASKET EXTRACTION,  LEFT DOUBLE J STENT PLACEMENT;  Surgeon: Carolan Clines, MD;  Location: [facility] ORS;  Service: Urology;  Laterality: Left;  . EP IMPLANTABLE DEVICE N/A 09/15/2015   Procedure: Loop Recorder Insertion;  Surgeon: [name], MD;  Location: [facility] CV LAB;  Service: Cardiovascular;  Laterality: N/A;  . EYE SURGERY Bilateral 2016   cataract surgery with implant ; dr Gerald Stabs grote   . FOREARM FRACTURE SURGERY Right ~ 1961  . FRACTURE SURGERY    . HOLMIUM LASER APPLICATION Left 1/61/0960   Procedure: HOLMIUM LASER APPLICATION;  Surgeon: Carolan Clines, MD;  Location: [facility];  Service: Urology;  Laterality: Left;  . JOINT REPLACEMENT    . KIDNEY STONE SURGERY  1980's   "stone lodged in the duct; had to cut me open to get it"  . KNEE ARTHROSCOPY Bilateral "multiple times"  . TEE WITHOUT CARDIOVERSION N/A 03/12/2015   Procedure: TRANSESOPHAGEAL ECHOCARDIOGRAM (TEE);  Surgeon: Pixie Casino, MD;  Location: [facility] ENDOSCOPY;  Service: Cardiovascular;  Laterality: N/A;  . TONSILLECTOMY    . TOTAL KNEE ARTHROPLASTY Left 2001   [facility]  . TOTAL KNEE ARTHROPLASTY Right 10/30/2017   Procedure: RIGHT TOTAL KNEE ARTHROPLASTY;  Surgeon: Paralee Cancel, MD;  Location: [facility];  Service: Orthopedics;  Laterality: Right;  90 mins    Current Outpatient Medications  Medication Sig  Dispense Refill  . acetaminophen (TYLENOL) 500 MG tablet Take 1,000 mg by mouth every 6 (six) hours as needed for headache.     . albuterol (PROVENTIL HFA;VENTOLIN HFA) 108 (90 Base) MCG/ACT inhaler Inhale 2 puffs into the lungs every 6 (six) hours as needed for wheezing or shortness of breath. 1 Inhaler 0  . allopurinol (ZYLOPRIM) 300 MG tablet Take 300 mg by mouth daily.      Marland Kitchen ammonium lactate (LAC-HYDRIN) 12 % lotion Apply 1 application topically daily as needed for dry skin (3-4 TIMES WEEK).     Marland Kitchen atorvastatin (LIPITOR) 20 MG tablet Take 1 tablet (20 mg total) by mouth daily. 90 tablet 3  . buPROPion (WELLBUTRIN XL) 300 MG 24 hr tablet TAKE 1 TABLET(300 MG) BY MOUTH DAILY (Patient taking differently: TAKE 1/2  TABLET ) BY MOUTH DAILY) 90 tablet 0  . busPIRone (BUSPAR) 10 MG tablet Take  1 tablet (10 mg total) by mouth daily. 30 tablet 2  . diltiazem (CARDIZEM CD) 180 MG 24 hr capsule Take 1 capsule (180 mg total) by mouth daily. (Patient taking differently: Take 180 mg by mouth every evening. ) 180 capsule 2  . ELIQUIS 5 MG TABS tablet TAKE 1 TABLET BY MOUTH TWICE DAILY 60 tablet 9  . loratadine (CLARITIN) 10 MG tablet Take 10 mg by mouth daily.    Marland Kitchen LORazepam (ATIVAN) 0.5 MG tablet TAKE 1 TABLET TWICE DAILY AS NEEDED FOR ANXIETY. 20 tablet 0  . metoprolol succinate (TOPROL-XL) 25 MG 24 hr tablet TAKE 1 TABLET BY MOUTH DAILY IN EVENING 90 tablet 2  . Multiple Vitamins-Minerals (MULTIVITAMIN WITH MINERALS) tablet Take 1 tablet by mouth daily.      Marland Kitchen olopatadine (PATANOL) 0.1 % ophthalmic solution Place 1 drop into the left eye as needed (tearing).    . Omega-3 Fatty Acids (FISH OIL TRIPLE STRENGTH) 1400 MG CAPS Take 1,400 mg by mouth daily.     Marland Kitchen omeprazole (PRILOSEC) 20 MG capsule Take 20 mg by mouth daily.    . tamsulosin (FLOMAX) 0.4 MG CAPS capsule Take 0.4 mg by mouth at bedtime.    Marland Kitchen HYDROmorphone (DILAUDID) 2 MG tablet Take 1-2 tablets (2-4 mg total) by mouth every 6 (six) hours as needed  for severe pain (breakthrough pain). (Patient not taking: Reported on 05/15/2018) 16 tablet 0  . polyethylene glycol (MIRALAX / GLYCOLAX) packet Take 17 g by mouth 2 (two) times daily. (Patient not taking: Reported on 05/15/2018) 14 each 0  . Potassium Citrate 15 MEQ (1620 MG) TBCR Take 15 mEq by mouth daily.  11  . zolpidem (AMBIEN) 5 MG tablet Take 1 tablet (5 mg total) by mouth at bedtime as needed for sleep. (Patient not taking: Reported on 05/15/2018) 30 tablet 0   No current facility-administered medications for this encounter.     Allergies  Allergen Reactions  . Codeine Itching  . Oxycodone Itching    Social History   Socioeconomic History  . Marital status: Married    Spouse name: Not on file  . Number of children: Not on file  . Years of education: Not on file  . Highest education level: Not on file  Occupational History  . Not on file  Social Needs  . Financial resource strain: Not on file  . Food insecurity:    Worry: Not on file    Inability: Not on file  . Transportation needs:    Medical: Not on file    Non-medical: Not on file  Tobacco Use  . Smoking status: Never Smoker  . Smokeless tobacco: Never Used  Substance and Sexual Activity  . Alcohol use: Yes    Comment: seldom   . Drug use: No  . Sexual activity: Yes  Lifestyle  . Physical activity:    Days per week: Not on file    Minutes per session: Not on file  . Stress: Not on file  Relationships  . Social connections:    Talks on phone: Not on file    Gets together: Not on file    Attends religious service: Not on file    Active member of club or organization: Not on file    Attends meetings of clubs or organizations: Not on file    Relationship status: Not on file  . Intimate partner violence:    Fear of current or ex partner: Not on file    Emotionally abused: Not  on file    Physically abused: Not on file    Forced sexual activity: Not on file  Other Topics Concern  . Not on file  Social  History Narrative   Pt lives in [place] with spouse.  94 yo son is health.  Second son died in car accident.      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 Heart attack Mother 63       MI  . Heart attack Father 65       ? MI versus trauma to head  . Healthy Brother     ROS- All systems are reviewed and negative except as per the HPI above  Physical Exam: Vitals:   05/15/18 0930  BP: 134/82  Pulse: 60  Weight: 243 lb (110.2 kg)  Height: 6' (1.829 m)    GEN- The patient is well appearing, alert and oriented x 3 today.   Head- normocephalic, atraumatic Eyes-  Sclera clear, conjunctiva pink Ears- hearing intact Oropharynx- clear Neck- supple, no JVP Lymph- no cervical lymphadenopathy Lungs- Clear to ausculation bilaterally, normal work of breathing Heart- regular rate and rhythm, 2/6  murmurs, rubs or gallops, PMI not laterally displaced GI- soft, NT, ND, + BS Extremities- no clubbing, cyanosis, or edema MS- no significant deformity or atrophy Skin- no rash or lesion Psych- euthymic mood, full affect Neuro- strength and sensation are intact  EKG- NSR, rate 60 bpm, pr int 153 ms, qrs int 88 ms, qtc 410 ms. Epic records reviewed.  Assessment and Plan: 1. Afib In SR, not aware of any palpitations  Linq in place Continue with remote transmissions, Continue eliquis at 5mg  bid, chadsvasc of 2. Last creatinine, 0.72, 12/18 Continue Cardizem at 180 mg at hs , metoprolol succinate at 25 mg qd   2. Aortic stenosis Followed by Dr. Tamala Julian  F/u in afib clinic as needed  Geroge Baseman. [place], [facility] [address] [place], [place] [address] [phone]

## 2018-05-16 NOTE — Progress Notes (Signed)
Carelink Summary Report / Loop Recorder 

## 2018-06-18 ENCOUNTER — Ambulatory Visit (INDEPENDENT_AMBULATORY_CARE_PROVIDER_SITE_OTHER): Payer: PPO | Admitting: *Deleted

## 2018-06-18 DIAGNOSIS — I480 Paroxysmal atrial fibrillation: Secondary | ICD-10-CM

## 2018-06-20 NOTE — Progress Notes (Signed)
Carelink Summary Report / Loop Recorder 

## 2018-06-21 LAB — CUP PACEART REMOTE DEVICE CHECK
Date Time Interrogation Session: 20190703020928
Implantable Pulse Generator Implant Date: 20161101

## 2018-06-24 ENCOUNTER — Other Ambulatory Visit: Payer: Self-pay | Admitting: Internal Medicine

## 2018-06-25 ENCOUNTER — Telehealth: Payer: Self-pay | Admitting: Family Medicine

## 2018-06-25 ENCOUNTER — Telehealth: Payer: Self-pay | Admitting: Internal Medicine

## 2018-06-25 MED ORDER — BUSPIRONE HCL 10 MG PO TABS: ORAL_TABLET | ORAL | 1 refills | Status: DC

## 2018-06-25 NOTE — Telephone Encounter (Signed)
Pt called and is requesting 60 pills of his buspar, states his heart dr filled for only 30 and you are the one that normally fills it for him,  Pt uses  [place], [address] AT [place] pt can be reached at [phone]

## 2018-06-25 NOTE — Telephone Encounter (Signed)
New problem    Pt need to speak to nurse concerning a medication that he need but he didn't know the name of the medicine. Pt wish to speak to nurse.

## 2018-06-25 NOTE — Telephone Encounter (Signed)
Medication refilled, referring further refills to PCP

## 2018-06-25 NOTE — Telephone Encounter (Signed)
Sent in refill for Buspar, Dr. [name] authorized the last refill and the patient was leaving for a trip. Informed pharmacy that further refills should go through PCP.

## 2018-07-04 DIAGNOSIS — Z86010 Personal history of colonic polyps: Secondary | ICD-10-CM | POA: Insufficient documentation

## 2018-07-04 DIAGNOSIS — K219 Gastro-esophageal reflux disease without esophagitis: Secondary | ICD-10-CM | POA: Diagnosis not present

## 2018-07-04 DIAGNOSIS — Z860101 Personal history of adenomatous and serrated colon polyps: Secondary | ICD-10-CM | POA: Insufficient documentation

## 2018-07-04 DIAGNOSIS — E785 Hyperlipidemia, unspecified: Secondary | ICD-10-CM | POA: Insufficient documentation

## 2018-07-05 ENCOUNTER — Telehealth: Payer: Self-pay | Admitting: Interventional Cardiology

## 2018-07-05 NOTE — Telephone Encounter (Signed)
   [facility] Pre-operative Risk Assessment    Request for surgical clearance:  1. What type of surgery is being performed? Colonoscopy   2. When is this surgery scheduled?  08/02/18   3. What type of clearance is required (medical clearance vs. Pharmacy clearance to hold med vs. Both)?  Medication  4. Are there any medications that need to be held prior to surgery and how long? Eliquis   5. Practice name and name of physician performing surgery? Dr. Dellis Filbert Medoff   6. What is your office phone number? [phone]    7.   What is your office fax number? [phone]  8.   Anesthesia type (None, local, MAC, general) ? Not listed    _________________________________________________________________   (provider comments below)

## 2018-07-06 NOTE — Telephone Encounter (Signed)
Pt needs colonoscopy please address Eliquis

## 2018-07-09 NOTE — Telephone Encounter (Signed)
Pt takes Eliquis for afib with CHADS2VASc score of 3 (age, HTN, DM). Prior provoked DVT after knee surgery in 2001. Renal function is normal. Ok to hold Eliquis for 1-2 days prior to colonoscopy.

## 2018-07-10 NOTE — Telephone Encounter (Signed)
   Primary Cardiologist: [name], MD  Chart reviewed as part of pre-operative protocol coverage. Patient was contacted 07/10/2018 in reference to pre-operative risk assessment for pending surgery as outlined below.  Jeremy Tucker was last seen on 05/15/18 by Roderic Palau.  Since that day, Jeremy Tucker has done well. He can complete greater than 4.0 METS.  Regarding eliquis, please see the below note from our pharmacist: Pt takes Eliquis for afib with CHADS2VASc score of 3 (age, HTN, DM). Prior provoked DVT after knee surgery in 2001. Renal function is normal. Ok to hold Eliquis for 1-2 days prior to colonoscopy.  Therefore, based on ACC/AHA guidelines, the patient would be at acceptable risk for the planned procedure without further cardiovascular testing.   I will route this recommendation to the requesting party via Epic fax function and remove from pre-op pool.   Please call with questions.  Tami Lin Duke, PA 07/10/2018, 4:41 PM

## 2018-07-11 ENCOUNTER — Telehealth: Payer: Self-pay | Admitting: *Deleted

## 2018-07-11 NOTE — Telephone Encounter (Signed)
Patient presented to the office stating that he was told yesterday that he could come by today to pick up eliquis samples. He tells me that he is in the donut hole and I provided him with two weeks of samples and made him aware that we wouldn't be able to supply him with samples for the remainder of the year. I mentioned that since he is in the donut hole he could apply for patient assistance but he didn't seem interested. He verbalized his understanding and thanked me for the samples.

## 2018-07-19 DIAGNOSIS — N401 Enlarged prostate with lower urinary tract symptoms: Secondary | ICD-10-CM | POA: Diagnosis not present

## 2018-07-20 ENCOUNTER — Ambulatory Visit (INDEPENDENT_AMBULATORY_CARE_PROVIDER_SITE_OTHER): Payer: PPO | Admitting: *Deleted

## 2018-07-20 DIAGNOSIS — I480 Paroxysmal atrial fibrillation: Secondary | ICD-10-CM | POA: Diagnosis not present

## 2018-07-21 NOTE — Progress Notes (Signed)
Carelink Summary Report / Loop Recorder 

## 2018-07-30 LAB — CUP PACEART REMOTE DEVICE CHECK
Date Time Interrogation Session: 20190805021143
Implantable Pulse Generator Implant Date: 20161101

## 2018-08-02 DIAGNOSIS — D123 Benign neoplasm of transverse colon: Secondary | ICD-10-CM | POA: Diagnosis not present

## 2018-08-02 DIAGNOSIS — D124 Benign neoplasm of descending colon: Secondary | ICD-10-CM | POA: Diagnosis not present

## 2018-08-02 DIAGNOSIS — K529 Noninfective gastroenteritis and colitis, unspecified: Secondary | ICD-10-CM | POA: Diagnosis not present

## 2018-08-02 DIAGNOSIS — K449 Diaphragmatic hernia without obstruction or gangrene: Secondary | ICD-10-CM

## 2018-08-02 DIAGNOSIS — K5289 Other specified noninfective gastroenteritis and colitis: Secondary | ICD-10-CM | POA: Diagnosis not present

## 2018-08-02 DIAGNOSIS — Z1211 Encounter for screening for malignant neoplasm of colon: Secondary | ICD-10-CM | POA: Diagnosis not present

## 2018-08-02 DIAGNOSIS — K635 Polyp of colon: Secondary | ICD-10-CM | POA: Diagnosis not present

## 2018-08-02 DIAGNOSIS — D125 Benign neoplasm of sigmoid colon: Secondary | ICD-10-CM | POA: Diagnosis not present

## 2018-08-02 DIAGNOSIS — K219 Gastro-esophageal reflux disease without esophagitis: Secondary | ICD-10-CM | POA: Diagnosis not present

## 2018-08-02 DIAGNOSIS — K6389 Other specified diseases of intestine: Secondary | ICD-10-CM | POA: Diagnosis not present

## 2018-08-02 DIAGNOSIS — K3189 Other diseases of stomach and duodenum: Secondary | ICD-10-CM | POA: Diagnosis not present

## 2018-08-02 DIAGNOSIS — K317 Polyp of stomach and duodenum: Secondary | ICD-10-CM

## 2018-08-02 DIAGNOSIS — Z86010 Personal history of colonic polyps: Secondary | ICD-10-CM | POA: Diagnosis not present

## 2018-08-02 HISTORY — DX: Polyp of stomach and duodenum: K31.7

## 2018-08-02 HISTORY — DX: Diaphragmatic hernia without obstruction or gangrene: K44.9

## 2018-08-05 ENCOUNTER — Other Ambulatory Visit: Payer: Self-pay | Admitting: Family Medicine

## 2018-08-06 NOTE — Telephone Encounter (Signed)
Appt made 08-21-18. [place]

## 2018-08-06 NOTE — Telephone Encounter (Signed)
He needs a med check appointment. 

## 2018-08-06 NOTE — Telephone Encounter (Signed)
[facility] is requesting to fill pt wellbutin. Please advise [facility]

## 2018-08-07 ENCOUNTER — Other Ambulatory Visit (HOSPITAL_COMMUNITY): Payer: PPO

## 2018-08-09 ENCOUNTER — Encounter: Payer: Self-pay | Admitting: Family Medicine

## 2018-08-10 LAB — CUP PACEART REMOTE DEVICE CHECK
Date Time Interrogation Session: 20190907024019
Implantable Pulse Generator Implant Date: 20161101

## 2018-08-15 DIAGNOSIS — L309 Dermatitis, unspecified: Secondary | ICD-10-CM | POA: Diagnosis not present

## 2018-08-15 DIAGNOSIS — Z85828 Personal history of other malignant neoplasm of skin: Secondary | ICD-10-CM | POA: Diagnosis not present

## 2018-08-15 DIAGNOSIS — L905 Scar conditions and fibrosis of skin: Secondary | ICD-10-CM | POA: Diagnosis not present

## 2018-08-21 ENCOUNTER — Encounter: Payer: Self-pay | Admitting: Family Medicine

## 2018-08-21 ENCOUNTER — Ambulatory Visit (INDEPENDENT_AMBULATORY_CARE_PROVIDER_SITE_OTHER): Payer: PPO | Admitting: Family Medicine

## 2018-08-21 VITALS — BP 126/78 | HR 64 | Temp 98.7°F | Wt 238.8 lb

## 2018-08-21 DIAGNOSIS — E118 Type 2 diabetes mellitus with unspecified complications: Secondary | ICD-10-CM | POA: Diagnosis not present

## 2018-08-21 DIAGNOSIS — M109 Gout, unspecified: Secondary | ICD-10-CM | POA: Diagnosis not present

## 2018-08-21 DIAGNOSIS — E669 Obesity, unspecified: Secondary | ICD-10-CM | POA: Diagnosis not present

## 2018-08-21 DIAGNOSIS — E785 Hyperlipidemia, unspecified: Secondary | ICD-10-CM

## 2018-08-21 DIAGNOSIS — Z9849 Cataract extraction status, unspecified eye: Secondary | ICD-10-CM | POA: Diagnosis not present

## 2018-08-21 DIAGNOSIS — I480 Paroxysmal atrial fibrillation: Secondary | ICD-10-CM

## 2018-08-21 DIAGNOSIS — E1169 Type 2 diabetes mellitus with other specified complication: Secondary | ICD-10-CM | POA: Diagnosis not present

## 2018-08-21 NOTE — Patient Instructions (Signed)
Add either Pepcid or Axid to your regimen for the itching

## 2018-08-21 NOTE — Progress Notes (Signed)
  Subjective:    Patient ID: Jeremy Tucker, male    DOB: [birth year], 67 y.o.   MRN: [id number]  ALF DOYLE is a 67 y.o. male who presents for follow-up of Type 2 diabetes mellitus.  Patient is not checking home blood sugars.   Home blood sugar records:none How often is blood sugars being checked: not Current symptoms/problems include none and have been unchanged. Daily foot checks: yes   Any foot concerns: none Last eye exam: 10/2017 Exercise: walking  He was recently seen by dermatology and is now being treated for a rash.  He also recently had a colonoscopy as well as endoscopy.  He was found to be in atrial fibrillation.  He continues on Eliquis as well as metoprolol and Cardizem.  He wants to continue to take his Wellbutrin and BuSpar and use Ativan on an as-needed basis for anxiety.  He also rarely uses a sleep med.  He is not interested in counseling and would like to continue to use these.  He does see urology and presently is taking Flomax.  He has had PSA tested in the past.  He is also taking allopurinol as much for renal stones and states that he has never had a gout attack.  Also has a history of cataract surgery. The following portions of the patient's history were reviewed and updated as appropriate: allergies, current medications, past medical history, past social history and problem list.  ROS as in subjective above.     Objective:    Physical Exam Alert and in no distress otherwise not examined.  Hemoglobin A1c is 6.7 Lab Review Diabetic Labs Latest Ref Rng & Units 02/19/2018 10/31/2017 10/26/2017 03/16/2017 08/16/2016  HbA1c 4.8 - 5.6 % - - 7.2(H) 6.9 6.5(H)  Chol 100 - 199 mg/dL 194 - - - 163  HDL >39 mg/dL 45 - - - 45  Calc LDL 0 - 99 mg/dL 97 - - - 90  Triglycerides 0 - 149 mg/dL 261(H) - - - 140  Creatinine 0.61 - 1.24 mg/dL - 0.72 0.69 - 0.75  GFR >60.00 mL/min - - - - -   BP/Weight 08/21/2018 05/15/2018 02/19/2018 01/25/2018 3/81/8299  Systolic BP 371 696 789 381 017   Diastolic BP 78 82 78 84 68  Wt. (Lbs) 238.8 243 236.4 229 240  BMI 32.39 32.96 32.06 31.06 32.55   Foot/eye exam completion dates 02/19/2018  Foot Form Completion Done    [place]  reports that he has never smoked. He has never used smokeless tobacco. He reports that he drinks alcohol. He reports that he does not use drugs.     Assessment & Plan:    Paroxysmal atrial fibrillation (HCC)  Controlled type 2 diabetes mellitus with complication, without long-term current use of insulin (HCC)  Hyperlipidemia associated with type 2 diabetes mellitus (HCC)  Gout, unspecified cause, unspecified chronicity, unspecified site  Obesity (BMI 30-39.9)  History of cataract extraction, unspecified laterality   1. Rx changes: none 2. Education: Reviewed 'ABCs' of diabetes management (respective goals in parentheses):  A1C (<7), blood pressure (<130/80), and cholesterol (LDL <100). 3. Compliance at present is estimated to be fair. Efforts to improve compliance (if necessary) will be directed at increased exercise. 4. Follow up: 4 months He will continue on his present medications.  I again discussed exercise with him which she seems to be doing a good job of it did recommend he cut back on carbohydrates.

## 2018-08-23 ENCOUNTER — Other Ambulatory Visit: Payer: Self-pay | Admitting: Interventional Cardiology

## 2018-08-23 ENCOUNTER — Ambulatory Visit (INDEPENDENT_AMBULATORY_CARE_PROVIDER_SITE_OTHER): Payer: PPO | Admitting: *Deleted

## 2018-08-23 DIAGNOSIS — I480 Paroxysmal atrial fibrillation: Secondary | ICD-10-CM | POA: Diagnosis not present

## 2018-08-23 NOTE — Progress Notes (Signed)
Carelink Summary Report / Loop Recorder 

## 2018-08-23 NOTE — Telephone Encounter (Signed)
Pt who is 67 yrs old, saw Afib clinic on 05/15/18. Last weight on 02/19/18 was 107.2Kg. SCr was 10/31/17 ws 0.72. Will refill Eliquis 5mg  BID.

## 2018-09-03 ENCOUNTER — Telehealth: Payer: Self-pay | Admitting: Interventional Cardiology

## 2018-09-03 NOTE — Telephone Encounter (Signed)
Returned pts call.  He wanted to change his appt from the 09/05/18.  He stated that when we called him to reschedule from 09/07/18, he didn't realize he already had an appt scheduled. Pt was r/s for 09/10/18 @ 3:40. Pt thanked me for the call.

## 2018-09-03 NOTE — Telephone Encounter (Signed)
New message    Pt is calling to speak with nurse about his appointment on Wednesday.

## 2018-09-04 ENCOUNTER — Other Ambulatory Visit (HOSPITAL_COMMUNITY): Payer: PPO

## 2018-09-05 ENCOUNTER — Ambulatory Visit: Payer: PPO | Admitting: Interventional Cardiology

## 2018-09-07 ENCOUNTER — Ambulatory Visit: Payer: PPO | Admitting: Interventional Cardiology

## 2018-09-07 ENCOUNTER — Other Ambulatory Visit: Payer: Self-pay | Admitting: Family Medicine

## 2018-09-07 NOTE — Telephone Encounter (Signed)
Is this ok to refill?  

## 2018-09-09 NOTE — Progress Notes (Signed)
Cardiology Office Note:    Date:  09/10/2018   ID:  Jeremy Tucker, DOB Jul 27, 1951, MRN [id number]  PCP:  Jeremy Lung, MD  Cardiologist:  Jeremy Grooms, MD   Referring MD: Jeremy Lung, MD   No chief complaint on file.   History of Present Illness:    Jeremy Tucker is a 67 y.o. male with a hx of PAF with hx of ablation 02/2015, HTN, mod aortic stenosis, GERD, HLD and anxiety disorder, chronic anticoagulation with Eliquis.  Atypical chest discomfort described as left pectoral discomfort that can last up to 20 to 30 minutes, 8 of 10 in intensity, and associated with radiation to the anterior aspect of the left upper arm.  2 episodes over the past 7 days.  Has had intermittent episodes for greater than 2 years.  No exertional component.  He describes the discomfort as outlined but cannot give a description of quality.  Even though he has had 2 episodes in the last 2 weeks, he has been able to walk greater than 2 miles each of the last 3 days without difficulty.  Past Medical History:  Diagnosis Date  . Aortic stenosis, mild   . BPH (benign prostatic hyperplasia)   . Bronchitis    patient mentions " i am prone to bronchitis"   . Controlled diabetes mellitus type 2 with complications ([place])   . Depression   . DJD (degenerative joint disease)   . DVT (deep venous thrombosis) ([place])    post op knee surgery in 2001 , behind calf  ; was resolved with blood thinners   . Dysrhythmia    Atrial fibrillation  . Gastric polyp 08/02/2018   see report in chart  . GERD (gastroesophageal reflux disease)   . Glucose intolerance (impaired glucose tolerance)   . Gout    denies  . Hiatal hernia 08/02/2018   see report in chart  . Hyperlipidemia   . Hyperlipidemia associated with type 2 diabetes mellitus ([place])   . Hypertension    denies  . Hypertension associated with diabetes ([place])   . Kidney stones   . Obesity   . Persistent atrial fibrillation    a. CHADsVASc score at least 2  .  Status post placement of implantable loop recorder 2 years ago ; 2016   left upper chest     Past Surgical History:  Procedure Laterality Date  . ATRIAL FIBRILLATION ABLATION N/A 03/12/2015   Procedure: ATRIAL FIBRILLATION ABLATION;  Surgeon: Jeremy Grayer, MD;  Location: [facility] CATH LAB;  Service: Cardiovascular;  Laterality: N/A;  . CARDIAC CATHETERIZATION  1990's   "Dr. Melvern Banker", no blockages per pt  . CARDIOVERSION N/A 02/25/2015   Procedure: CARDIOVERSION;  Surgeon: Jeremy Margarita, MD;  Location: [place];  Service: Cardiovascular;  Laterality: N/A;  . COLONOSCOPY  2010   [facility]  . CYSTOSCOPY W/ STONE MANIPULATION  1980's   "couldn't get stone so they had to cut me open"  . CYSTOSCOPY WITH RETROGRADE PYELOGRAM, URETEROSCOPY AND STENT PLACEMENT Left 04/24/2015   Procedure: URETHRAL MEATAL DILATION, CYSTOSCOPY WITH LEFT RETROGRADE PYELOGRAM, LEFT URETEROSCOPY, STONE BASKET EXTRACTION,  LEFT DOUBLE J STENT PLACEMENT;  Surgeon: Jeremy Clines, MD;  Location: [facility];  Service: Urology;  Laterality: Left;  . EP IMPLANTABLE DEVICE N/A 09/15/2015   Procedure: Loop Recorder Insertion;  Surgeon: Jeremy Grayer, MD;  Location: [facility] CV LAB;  Service: Cardiovascular;  Laterality: N/A;  . EYE SURGERY Bilateral 2016   cataract surgery with implant ;  dr Jeremy Tucker   . FOREARM FRACTURE SURGERY Right ~ 1961  . FRACTURE SURGERY    . HOLMIUM LASER APPLICATION Left 02/02/253   Procedure: HOLMIUM LASER APPLICATION;  Surgeon: Jeremy Clines, MD;  Location: [facility];  Service: Urology;  Laterality: Left;  . JOINT REPLACEMENT    . KIDNEY STONE SURGERY  1980's   "stone lodged in the duct; had to cut me open to get it"  . KNEE ARTHROSCOPY Bilateral "multiple times"  . TEE WITHOUT CARDIOVERSION N/A 03/12/2015   Procedure: TRANSESOPHAGEAL ECHOCARDIOGRAM (TEE);  Surgeon: Jeremy Casino, MD;  Location[facility] ENDOSCOPY;  Service: Cardiovascular;  Laterality: N/A;  . TONSILLECTOMY    . TOTAL KNEE  ARTHROPLASTY Left 2001   [facility]  . TOTAL KNEE ARTHROPLASTY Right 10/30/2017   Procedure: RIGHT TOTAL KNEE ARTHROPLASTY;  Surgeon: Jeremy Cancel, MD;  Location: [facility];  Service: Orthopedics;  Laterality: Right;  90 mins    Current Medications: Current Meds  Medication Sig  . acetaminophen (TYLENOL) 500 MG tablet Take 1,000 mg by mouth every 6 (six) hours as needed for headache.   . albuterol (PROVENTIL HFA;VENTOLIN HFA) 108 (90 Base) MCG/ACT inhaler Inhale 2 puffs into the lungs every 6 (six) hours as needed for wheezing or shortness of breath.  . allopurinol (ZYLOPRIM) 300 MG tablet Take 300 mg by mouth daily.    Jeremy Tucker ammonium lactate (LAC-HYDRIN) 12 % lotion Apply 1 application topically daily as needed for dry skin (3-4 TIMES WEEK).   Jeremy Tucker atorvastatin (LIPITOR) 20 MG tablet Take 1 tablet (20 mg total) by mouth daily.  Jeremy Tucker buPROPion (WELLBUTRIN XL) 300 MG 24 hr tablet TAKE 1 TABLET(300 MG) BY MOUTH DAILY  . busPIRone (BUSPAR) 10 MG tablet TAKE 1 TABLET(10 MG) BY MOUTH DAILY  . diltiazem (CARDIZEM CD) 180 MG 24 hr capsule Take 1 capsule (180 mg total) by mouth daily. (Patient taking differently: Take 180 mg by mouth every evening. )  . ELIQUIS 5 MG TABS tablet TAKE 1 TABLET BY MOUTH TWICE DAILY  . HYDROmorphone (DILAUDID) 2 MG tablet Take 1-2 tablets (2-4 mg total) by mouth every 6 (six) hours as needed for severe pain (breakthrough pain).  Jeremy Tucker loratadine (CLARITIN) 10 MG tablet Take 10 mg by mouth daily.  Jeremy Tucker LORazepam (ATIVAN) 0.5 MG tablet TAKE 1 TABLET TWICE DAILY AS NEEDED FOR ANXIETY.  . metoprolol succinate (TOPROL-XL) 25 MG 24 hr tablet TAKE 1 TABLET BY MOUTH DAILY IN EVENING  . Multiple Vitamins-Minerals (MULTIVITAMIN WITH MINERALS) tablet Take 1 tablet by mouth daily.    Jeremy Tucker olopatadine (PATANOL) 0.1 % ophthalmic solution Place 1 drop into the left eye as needed (tearing).  . Omega-3 Fatty Acids (FISH OIL TRIPLE STRENGTH) 1400 MG CAPS Take 1,400 mg by mouth daily.   Jeremy Tucker omeprazole (PRILOSEC) 20  MG capsule Take 20 mg by mouth daily.  . polyethylene glycol (MIRALAX / GLYCOLAX) packet Take 17 g by mouth 2 (two) times daily.  . Potassium Citrate 15 MEQ (1620 MG) TBCR Take 15 mEq by mouth daily.  . tamsulosin (FLOMAX) 0.4 MG CAPS capsule Take 0.4 mg by mouth at bedtime.  . triamcinolone cream (KENALOG) 0.1 % Apply 1 application topically.  Jeremy Tucker zolpidem (AMBIEN) 5 MG tablet Take 1 tablet (5 mg total) by mouth at bedtime as needed for sleep.     Allergies:   Codeine and Oxycodone   Social History   Socioeconomic History  . Marital status: Married    Spouse name: Not on file  . Number of  children: Not on file  . Years of education: Not on file  . Highest education level: Not on file  Occupational History  . Not on file  Social Needs  . Financial resource strain: Not on file  . Food insecurity:    Worry: Not on file    Inability: Not on file  . Transportation needs:    Medical: Not on file    Non-medical: Not on file  Tobacco Use  . Smoking status: Never Smoker  . Smokeless tobacco: Never Used  Substance and Sexual Activity  . Alcohol use: Yes    Comment: seldom   . Drug use: No  . Sexual activity: Yes  Lifestyle  . Physical activity:    Days per week: Not on file    Minutes per session: Not on file  . Stress: Not on file  Relationships  . Social connections:    Talks on phone: Not on file    Gets together: Not on file    Attends religious service: Not on file    Active member of club or organization: Not on file    Attends meetings of clubs or organizations: Not on file    Relationship status: Not on file  Other Topics Concern  . Not on file  Social History Narrative   Pt lives in [place] with spouse.  76 yo son is health.  Second son died in car accident.      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  ROS:   Please see the history of present illness.    Anxiety concerning heart health.  All other systems reviewed and are negative.  EKGs/Labs/Other Studies Reviewed:    The following studies were reviewed today: 2D Doppler echocardiogram October 11, 2016:  Study Conclusions  - Left ventricle: The cavity size was normal. Wall thickness was   increased in a pattern of mild LVH. There was moderate focal   basal hypertrophy of the septum. Systolic function was vigorous.   The estimated ejection fraction was in the range of 65% to 70%.   The study is not technically sufficient to allow evaluation of LV   diastolic function. - Aortic valve: Moderately calcified trileaflet valve with moderate   stenosis. There was mild regurgitation. Mean gradient (S): 17 mm   Hg. Peak gradient (S): 33 mm Hg. Valve area (Vmax): 1.3 cm^2. - Left atrium: The atrium was mildly dilated. - Tricuspid valve: There was trivial regurgitation. - Pulmonary arteries: PA peak pressure: 25 mm Hg (S). - Inferior vena cava: The vessel was normal in size. The   respirophasic diameter changes were in the normal range (>= 50%),   consistent with normal central venous pressure.  Impressions:  - Compared to a prior echo in 2016, the LV is higher at 65-70%.   There is still moderate aortic stenosis.   EKG:  EKG is not ordered today.  The last tracing was performed during visit in the EP clinic for A. fib demonstrating sinus rhythm/bradycardia with no abnormalities noted.  Recent Labs: 10/31/2017: BUN 13; Creatinine, Ser 0.72; Hemoglobin 12.5; Platelets 191; Potassium 4.2; Sodium 136  Recent Lipid Panel    Component Value Date/Time   CHOL 194 02/19/2018 1110   TRIG 261 (H) 02/19/2018 1110   HDL 45 02/19/2018 1110   CHOLHDL 4.3  02/19/2018 1110   CHOLHDL 3.6 08/16/2016 0925   VLDL 28 08/16/2016 0925   LDLCALC 97 02/19/2018 1110    Physical Exam:    VS:  BP 112/70   Pulse 70   Ht 6' (1.829  m)   Wt 236 lb 12.8 oz (107.4 kg)   BMI 32.12 kg/m     Wt Readings from Last 3 Encounters:  09/10/18 236 lb 12.8 oz (107.4 kg)  08/21/18 238 lb 12.8 oz (108.3 kg)  05/15/18 243 lb (110.2 kg)     GEN:  Well nourished, well developed in no acute distress HEENT: Normal NECK: No JVD. LYMPHATICS: No lymphadenopathy CARDIAC: RRR, 2/6 to 3/6 crescendo decrescendo systolic murmur, no gallop, no edema. VASCULAR: 2+ bilateral radial pulses.  No bruits. RESPIRATORY:  Clear to auscultation without rales, wheezing or rhonchi  ABDOMEN: Soft, non-tender, non-distended, No pulsatile mass, MUSCULOSKELETAL: No deformity  SKIN: Warm and dry NEUROLOGIC:  Alert and oriented x 3 PSYCHIATRIC:  Normal affect   ASSESSMENT:    1. Aortic stenosis, moderate   2. Chest pain at rest   3. Paroxysmal atrial fibrillation (HCC)   4. Essential hypertension   5. Chronic anticoagulation   6. Hyperlipidemia associated with type 2 diabetes mellitus ([place])   7. OSA (obstructive sleep apnea)    PLAN:    In order of problems listed above:  1. 2D Doppler echocardiogram to assess for progression of aortic stenosis in the setting of calcified tricuspid aortic valve documented by echo in 2017 with mild stenosis based upon hemodynamic/Doppler data. 2. Coronary CT Angio with morphology and FFR if indicated.  3. No clinical recurrences of atrial fibrillation although he was told he had some atrial fibrillation during his colonoscopy.  Colonoscopy was performed this summer. CHADS VASC 2. 4. Blood pressures under excellent control. 5. No bleeding complications on apixaban for atrial fibrillation. CHADS VASC 2. 6. Target LDL less than 70.  Most recent LDL 97.  Depending upon CT may need to advance statin therapy to high intensity to get LDL less than 70.  If CT is unremarkable, no additional changes will be made.  Long discussion concerning aortic stenosis and the possibility of coronary disease.  Since he had atrial  fibrillation during colonoscopy and was unaware, he could be having atrial fib without symptoms or perhaps his symptoms are related in someway to the chest pain.  The echocardiogram and coronary CT will help risk stratify.  Greater than 50% of the time during this office visit was spent in education, counseling, and coordination of care related to underlying disease process and testing as outlined.    Medication Adjustments/Labs and Tests Ordered: Current medicines are reviewed at length with the patient today.  Concerns regarding medicines are outlined above.  Orders Placed This Encounter  Procedures  . CT CORONARY MORPH W/CTA COR W/SCORE W/CA W/CM &/OR WO/CM  . CT CORONARY FRACTIONAL FLOW RESERVE DATA PREP  . CT CORONARY FRACTIONAL FLOW RESERVE FLUID ANALYSIS  . Basic Metabolic Panel (BMET)  . ECHOCARDIOGRAM COMPLETE   No orders of the defined types were placed in this encounter.   Patient Instructions  Medication Instructions:  Your physician recommends that you continue on your current medications as directed. Please refer to the Current Medication list given to you today.  If you need a refill on your cardiac medications before your next appointment, please call your pharmacy.   Lab work: Lab work to be done today--BMP If you have labs (blood work) drawn today and  your tests are completely normal, you will receive your results only by: Jeremy Tucker MyChart Message (if you have MyChart) OR . A paper copy in the mail If you have any lab test that is abnormal or we need to change your treatment, we will call you to review the results.  Testing/Procedures: Your physician has requested that you have an echocardiogram. Echocardiography is a painless test that uses sound waves to create images of your heart. It provides your doctor with information about the size and shape of your heart and how well your heart's chambers and valves are working. This procedure takes approximately one hour. There  are no restrictions for this procedure.  Your physician has requested that you have cardiac CT. Cardiac computed tomography (CT) is a painless test that uses an x-ray machine to take clear, detailed pictures of your heart. For further information please visit [email or web address][email or web address]. Please follow instruction sheet as given.     Follow-Up: At [facility], you and your health needs are our priority.  As part of our continuing mission to provide you with exceptional heart care, we have created designated Provider Care Teams.  These Care Teams include your primary Cardiologist (physician) and Advanced Practice Providers (APPs -  Physician Assistants and Nurse Practitioners) who all work together to provide you with the care you need, when you need it. You will need a follow up appointment in 12 months.  Please call our office 2 months in advance to schedule this appointment.  You may see Jeremy Grooms, MD or one of the following Advanced Practice Providers on your designated Care Team:   Truitt Merle, NP Cecilie Kicks, NP . Kathyrn Drown, NP  Any Other Special Instructions Will Be Listed Below (If Applicable).    Please arrive at the [facility] main entrance of [facility] at  AM (30-45 minutes prior to test start time)  [facility] [place], [place] [address] [phone]  Proceed to the [facility] Radiology Department (First Floor).  Please follow these instructions carefully (unless otherwise directed):  Hold all erectile dysfunction medications at least 48 hours prior to test.  On the Night Before the Test: . Be sure to Drink plenty of water. . Do not consume any caffeinated/decaffeinated beverages or chocolate 12 hours prior to your test. . Do not take any antihistamines 12 hours prior to your test. . If you take Metformin do not take 24 hours prior to test. IOn the Day of the Test: . Drink plenty of water. Do not drink any water within  one hour of the test. . Do not eat any food 4 hours prior to the test. . You may take your regular medications prior to the test.  Take metoprolol 2 hours prior to test         After the Test: . Drink plenty of water. . After receiving IV contrast, you may experience a mild flushed feeling. This is normal. . On occasion, you may experience a mild rash up to 24 hours after the test. This is not dangerous. If this occurs, you can take Benadryl 25 mg and increase your fluid intake. . If you experience trouble breathing, this can be serious. If it is severe call 911 IMMEDIATELY. If it is mild, please call our office. . If you take any of these medications: Glipizide/Metformin, Avandament, Glucavance, please do not take 48 hours after completing test.       Signed, [name],  MD  09/10/2018 5:51 PM    [place]

## 2018-09-09 NOTE — H&P (View-Only) (Signed)
Cardiology Office Note:    Date:  09/10/2018   ID:  Jeremy Tucker, DOB 1951/04/26, MRN [id number]  PCP:  Denita Lung, MD  Cardiologist:  [name], MD   Referring MD: Denita Lung, MD   No chief complaint on file.   History of Present Illness:    Jeremy Tucker is a 67 y.o. male with a hx of PAF with hx of ablation 02/2015, HTN, mod aortic stenosis, GERD, HLD and anxiety disorder, chronic anticoagulation with Eliquis.  Atypical chest discomfort described as left pectoral discomfort that can last up to 20 to 30 minutes, 8 of 10 in intensity, and associated with radiation to the anterior aspect of the left upper arm.  2 episodes over the past 7 days.  Has had intermittent episodes for greater than 2 years.  No exertional component.  He describes the discomfort as outlined but cannot give a description of quality.  Even though he has had 2 episodes in the last 2 weeks, he has been able to walk greater than 2 miles each of the last 3 days without difficulty.  Past Medical History:  Diagnosis Date  . Aortic stenosis, mild   . BPH (benign prostatic hyperplasia)   . Bronchitis    patient mentions " i am prone to bronchitis"   . Controlled diabetes mellitus type 2 with complications ([place])   . Depression   . DJD (degenerative joint disease)   . DVT (deep venous thrombosis) ([place])    post op knee surgery in 2001 , behind calf  ; was resolved with blood thinners   . Dysrhythmia    Atrial fibrillation  . Gastric polyp 08/02/2018   see report in chart  . GERD (gastroesophageal reflux disease)   . Glucose intolerance (impaired glucose tolerance)   . Gout    denies  . Hiatal hernia 08/02/2018   see report in chart  . Hyperlipidemia   . Hyperlipidemia associated with type 2 diabetes mellitus ([place])   . Hypertension    denies  . Hypertension associated with diabetes ([place])   . Kidney stones   . Obesity   . Persistent atrial fibrillation    a. CHADsVASc score at least 2  .  Status post placement of implantable loop recorder 2 years ago ; 2016   left upper chest     Past Surgical History:  Procedure Laterality Date  . ATRIAL FIBRILLATION ABLATION N/A 03/12/2015   Procedure: ATRIAL FIBRILLATION ABLATION;  Surgeon: [name], MD;  Location: [facility] CATH LAB;  Service: Cardiovascular;  Laterality: N/A;  . CARDIAC CATHETERIZATION  1990's   "Dr. Melvern Banker", no blockages per pt  . CARDIOVERSION N/A 02/25/2015   Procedure: CARDIOVERSION;  Surgeon: Sueanne Margarita, MD;  Location: [place];  Service: Cardiovascular;  Laterality: N/A;  . COLONOSCOPY  2010   [facility]  . CYSTOSCOPY W/ STONE MANIPULATION  1980's   "couldn't get stone so they had to cut me open"  . CYSTOSCOPY WITH RETROGRADE PYELOGRAM, URETEROSCOPY AND STENT PLACEMENT Left 04/24/2015   Procedure: URETHRAL MEATAL DILATION, CYSTOSCOPY WITH LEFT RETROGRADE PYELOGRAM, LEFT URETEROSCOPY, STONE BASKET EXTRACTION,  LEFT DOUBLE J STENT PLACEMENT;  Surgeon: Carolan Clines, MD;  Location: [facility];  Service: Urology;  Laterality: Left;  . EP IMPLANTABLE DEVICE N/A 09/15/2015   Procedure: Loop Recorder Insertion;  Surgeon: [name], MD;  Location: [facility] CV LAB;  Service: Cardiovascular;  Laterality: N/A;  . EYE SURGERY Bilateral 2016   cataract surgery with implant ;  dr Gerald Stabs grote   . FOREARM FRACTURE SURGERY Right ~ 1961  . FRACTURE SURGERY    . HOLMIUM LASER APPLICATION Left 7/62/8315   Procedure: HOLMIUM LASER APPLICATION;  Surgeon: Carolan Clines, MD;  Location: [facility];  Service: Urology;  Laterality: Left;  . JOINT REPLACEMENT    . KIDNEY STONE SURGERY  1980's   "stone lodged in the duct; had to cut me open to get it"  . KNEE ARTHROSCOPY Bilateral "multiple times"  . TEE WITHOUT CARDIOVERSION N/A 03/12/2015   Procedure: TRANSESOPHAGEAL ECHOCARDIOGRAM (TEE);  Surgeon: Pixie Casino, MD;  Location: [facility] ENDOSCOPY;  Service: Cardiovascular;  Laterality: N/A;  . TONSILLECTOMY    . TOTAL KNEE  ARTHROPLASTY Left 2001   [facility]  . TOTAL KNEE ARTHROPLASTY Right 10/30/2017   Procedure: RIGHT TOTAL KNEE ARTHROPLASTY;  Surgeon: Paralee Cancel, MD;  Location: [facility];  Service: Orthopedics;  Laterality: Right;  90 mins    Current Medications: Current Meds  Medication Sig  . acetaminophen (TYLENOL) 500 MG tablet Take 1,000 mg by mouth every 6 (six) hours as needed for headache.   . albuterol (PROVENTIL HFA;VENTOLIN HFA) 108 (90 Base) MCG/ACT inhaler Inhale 2 puffs into the lungs every 6 (six) hours as needed for wheezing or shortness of breath.  . allopurinol (ZYLOPRIM) 300 MG tablet Take 300 mg by mouth daily.    Marland Kitchen ammonium lactate (LAC-HYDRIN) 12 % lotion Apply 1 application topically daily as needed for dry skin (3-4 TIMES WEEK).   Marland Kitchen atorvastatin (LIPITOR) 20 MG tablet Take 1 tablet (20 mg total) by mouth daily.  Marland Kitchen buPROPion (WELLBUTRIN XL) 300 MG 24 hr tablet TAKE 1 TABLET(300 MG) BY MOUTH DAILY  . busPIRone (BUSPAR) 10 MG tablet TAKE 1 TABLET(10 MG) BY MOUTH DAILY  . diltiazem (CARDIZEM CD) 180 MG 24 hr capsule Take 1 capsule (180 mg total) by mouth daily. (Patient taking differently: Take 180 mg by mouth every evening. )  . ELIQUIS 5 MG TABS tablet TAKE 1 TABLET BY MOUTH TWICE DAILY  . HYDROmorphone (DILAUDID) 2 MG tablet Take 1-2 tablets (2-4 mg total) by mouth every 6 (six) hours as needed for severe pain (breakthrough pain).  Marland Kitchen loratadine (CLARITIN) 10 MG tablet Take 10 mg by mouth daily.  Marland Kitchen LORazepam (ATIVAN) 0.5 MG tablet TAKE 1 TABLET TWICE DAILY AS NEEDED FOR ANXIETY.  . metoprolol succinate (TOPROL-XL) 25 MG 24 hr tablet TAKE 1 TABLET BY MOUTH DAILY IN EVENING  . Multiple Vitamins-Minerals (MULTIVITAMIN WITH MINERALS) tablet Take 1 tablet by mouth daily.    Marland Kitchen olopatadine (PATANOL) 0.1 % ophthalmic solution Place 1 drop into the left eye as needed (tearing).  . Omega-3 Fatty Acids (FISH OIL TRIPLE STRENGTH) 1400 MG CAPS Take 1,400 mg by mouth daily.   Marland Kitchen omeprazole (PRILOSEC) 20  MG capsule Take 20 mg by mouth daily.  . polyethylene glycol (MIRALAX / GLYCOLAX) packet Take 17 g by mouth 2 (two) times daily.  . Potassium Citrate 15 MEQ (1620 MG) TBCR Take 15 mEq by mouth daily.  . tamsulosin (FLOMAX) 0.4 MG CAPS capsule Take 0.4 mg by mouth at bedtime.  . triamcinolone cream (KENALOG) 0.1 % Apply 1 application topically.  Marland Kitchen zolpidem (AMBIEN) 5 MG tablet Take 1 tablet (5 mg total) by mouth at bedtime as needed for sleep.     Allergies:   Codeine and Oxycodone   Social History   Socioeconomic History  . Marital status: Married    Spouse name: Not on file  . Number of  children: Not on file  . Years of education: Not on file  . Highest education level: Not on file  Occupational History  . Not on file  Social Needs  . Financial resource strain: Not on file  . Food insecurity:    Worry: Not on file    Inability: Not on file  . Transportation needs:    Medical: Not on file    Non-medical: Not on file  Tobacco Use  . Smoking status: Never Smoker  . Smokeless tobacco: Never Used  Substance and Sexual Activity  . Alcohol use: Yes    Comment: seldom   . Drug use: No  . Sexual activity: Yes  Lifestyle  . Physical activity:    Days per week: Not on file    Minutes per session: Not on file  . Stress: Not on file  Relationships  . Social connections:    Talks on phone: Not on file    Gets together: Not on file    Attends religious service: Not on file    Active member of club or organization: Not on file    Attends meetings of clubs or organizations: Not on file    Relationship status: Not on file  Other Topics Concern  . Not on file  Social History Narrative   Pt lives in [place] with spouse.  51 yo son is health.  Second son died in car accident.      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  ROS:   Please see the history of present illness.    Anxiety concerning heart health.  All other systems reviewed and are negative.  EKGs/Labs/Other Studies Reviewed:    The following studies were reviewed today: 2D Doppler echocardiogram October 11, 2016:  Study Conclusions  - Left ventricle: The cavity size was normal. Wall thickness was   increased in a pattern of mild LVH. There was moderate focal   basal hypertrophy of the septum. Systolic function was vigorous.   The estimated ejection fraction was in the range of 65% to 70%.   The study is not technically sufficient to allow evaluation of LV   diastolic function. - Aortic valve: Moderately calcified trileaflet valve with moderate   stenosis. There was mild regurgitation. Mean gradient (S): 17 mm   Hg. Peak gradient (S): 33 mm Hg. Valve area (Vmax): 1.3 cm^2. - Left atrium: The atrium was mildly dilated. - Tricuspid valve: There was trivial regurgitation. - Pulmonary arteries: PA peak pressure: 25 mm Hg (S). - Inferior vena cava: The vessel was normal in size. The   respirophasic diameter changes were in the normal range (>= 50%),   consistent with normal central venous pressure.  Impressions:  - Compared to a prior echo in 2016, the LV is higher at 65-70%.   There is still moderate aortic stenosis.   EKG:  EKG is not ordered today.  The last tracing was performed during visit in the EP clinic for A. fib demonstrating sinus rhythm/bradycardia with no abnormalities noted.  Recent Labs: 10/31/2017: BUN 13; Creatinine, Ser 0.72; Hemoglobin 12.5; Platelets 191; Potassium 4.2; Sodium 136  Recent Lipid Panel    Component Value Date/Time   CHOL 194 02/19/2018 1110   TRIG 261 (H) 02/19/2018 1110   HDL 45 02/19/2018 1110   CHOLHDL 4.3  02/19/2018 1110   CHOLHDL 3.6 08/16/2016 0925   VLDL 28 08/16/2016 0925   LDLCALC 97 02/19/2018 1110    Physical Exam:    VS:  BP 112/70   Pulse 70   Ht 6' (1.829  m)   Wt 236 lb 12.8 oz (107.4 kg)   BMI 32.12 kg/m     Wt Readings from Last 3 Encounters:  09/10/18 236 lb 12.8 oz (107.4 kg)  08/21/18 238 lb 12.8 oz (108.3 kg)  05/15/18 243 lb (110.2 kg)     GEN:  Well nourished, well developed in no acute distress HEENT: Normal NECK: No JVD. LYMPHATICS: No lymphadenopathy CARDIAC: RRR, 2/6 to 3/6 crescendo decrescendo systolic murmur, no gallop, no edema. VASCULAR: 2+ bilateral radial pulses.  No bruits. RESPIRATORY:  Clear to auscultation without rales, wheezing or rhonchi  ABDOMEN: Soft, non-tender, non-distended, No pulsatile mass, MUSCULOSKELETAL: No deformity  SKIN: Warm and dry NEUROLOGIC:  Alert and oriented x 3 PSYCHIATRIC:  Normal affect   ASSESSMENT:    1. Aortic stenosis, moderate   2. Chest pain at rest   3. Paroxysmal atrial fibrillation (HCC)   4. Essential hypertension   5. Chronic anticoagulation   6. Hyperlipidemia associated with type 2 diabetes mellitus ([place])   7. OSA (obstructive sleep apnea)    PLAN:    In order of problems listed above:  1. 2D Doppler echocardiogram to assess for progression of aortic stenosis in the setting of calcified tricuspid aortic valve documented by echo in 2017 with mild stenosis based upon hemodynamic/Doppler data. 2. Coronary CT Angio with morphology and FFR if indicated.  3. No clinical recurrences of atrial fibrillation although he was told he had some atrial fibrillation during his colonoscopy.  Colonoscopy was performed this summer. CHADS VASC 2. 4. Blood pressures under excellent control. 5. No bleeding complications on apixaban for atrial fibrillation. CHADS VASC 2. 6. Target LDL less than 70.  Most recent LDL 97.  Depending upon CT may need to advance statin therapy to high intensity to get LDL less than 70.  If CT is unremarkable, no additional changes will be made.  Long discussion concerning aortic stenosis and the possibility of coronary disease.  Since he had atrial  fibrillation during colonoscopy and was unaware, he could be having atrial fib without symptoms or perhaps his symptoms are related in someway to the chest pain.  The echocardiogram and coronary CT will help risk stratify.  Greater than 50% of the time during this office visit was spent in education, counseling, and coordination of care related to underlying disease process and testing as outlined.    Medication Adjustments/Labs and Tests Ordered: Current medicines are reviewed at length with the patient today.  Concerns regarding medicines are outlined above.  Orders Placed This Encounter  Procedures  . CT CORONARY MORPH W/CTA COR W/SCORE W/CA W/CM &/OR WO/CM  . CT CORONARY FRACTIONAL FLOW RESERVE DATA PREP  . CT CORONARY FRACTIONAL FLOW RESERVE FLUID ANALYSIS  . Basic Metabolic Panel (BMET)  . ECHOCARDIOGRAM COMPLETE   No orders of the defined types were placed in this encounter.   Patient Instructions  Medication Instructions:  Your physician recommends that you continue on your current medications as directed. Please refer to the Current Medication list given to you today.  If you need a refill on your cardiac medications before your next appointment, please call your pharmacy.   Lab work: Lab work to be done today--BMP If you have labs (blood work) drawn today and  your tests are completely normal, you will receive your results only by: Marland Kitchen MyChart Message (if you have MyChart) OR . A paper copy in the mail If you have any lab test that is abnormal or we need to change your treatment, we will call you to review the results.  Testing/Procedures: Your physician has requested that you have an echocardiogram. Echocardiography is a painless test that uses sound waves to create images of your heart. It provides your doctor with information about the size and shape of your heart and how well your heart's chambers and valves are working. This procedure takes approximately one hour. There  are no restrictions for this procedure.  Your physician has requested that you have cardiac CT. Cardiac computed tomography (CT) is a painless test that uses an x-ray machine to take clear, detailed pictures of your heart. For further information please visit HugeFiesta.[email or web address]. Please follow instruction sheet as given.     Follow-Up: At [facility], you and your health needs are our priority.  As part of our continuing mission to provide you with exceptional heart care, we have created designated Provider Care Teams.  These Care Teams include your primary Cardiologist (physician) and Advanced Practice Providers (APPs -  Physician Assistants and Nurse Practitioners) who all work together to provide you with the care you need, when you need it. You will need a follow up appointment in 12 months.  Please call our office 2 months in advance to schedule this appointment.  You may see [name], MD or one of the following Advanced Practice Providers on your designated Care Team:   Truitt Merle, NP Cecilie Kicks, NP . Kathyrn Drown, NP  Any Other Special Instructions Will Be Listed Below (If Applicable).    Please arrive at the [facility] main entrance of [facility] at  AM (30-45 minutes prior to test start time)  [facility] [address] [place], [place] [address] [phone]  Proceed to the [facility] Radiology Department (First Floor).  Please follow these instructions carefully (unless otherwise directed):  Hold all erectile dysfunction medications at least 48 hours prior to test.  On the Night Before the Test: . Be sure to Drink plenty of water. . Do not consume any caffeinated/decaffeinated beverages or chocolate 12 hours prior to your test. . Do not take any antihistamines 12 hours prior to your test. . If you take Metformin do not take 24 hours prior to test. IOn the Day of the Test: . Drink plenty of water. Do not drink any water within  one hour of the test. . Do not eat any food 4 hours prior to the test. . You may take your regular medications prior to the test.  Take metoprolol 2 hours prior to test         After the Test: . Drink plenty of water. . After receiving IV contrast, you may experience a mild flushed feeling. This is normal. . On occasion, you may experience a mild rash up to 24 hours after the test. This is not dangerous. If this occurs, you can take Benadryl 25 mg and increase your fluid intake. . If you experience trouble breathing, this can be serious. If it is severe call 911 IMMEDIATELY. If it is mild, please call our office. . If you take any of these medications: Glipizide/Metformin, Avandament, Glucavance, please do not take 48 hours after completing test.       Signed, [name],  MD  09/10/2018 5:51 PM    [place]

## 2018-09-10 ENCOUNTER — Encounter: Payer: Self-pay | Admitting: Interventional Cardiology

## 2018-09-10 ENCOUNTER — Ambulatory Visit: Payer: PPO | Admitting: Interventional Cardiology

## 2018-09-10 VITALS — BP 112/70 | HR 70 | Ht 72.0 in | Wt 236.8 lb

## 2018-09-10 DIAGNOSIS — I480 Paroxysmal atrial fibrillation: Secondary | ICD-10-CM

## 2018-09-10 DIAGNOSIS — R079 Chest pain, unspecified: Secondary | ICD-10-CM

## 2018-09-10 DIAGNOSIS — E1169 Type 2 diabetes mellitus with other specified complication: Secondary | ICD-10-CM | POA: Diagnosis not present

## 2018-09-10 DIAGNOSIS — G4733 Obstructive sleep apnea (adult) (pediatric): Secondary | ICD-10-CM

## 2018-09-10 DIAGNOSIS — I10 Essential (primary) hypertension: Secondary | ICD-10-CM

## 2018-09-10 DIAGNOSIS — I350 Nonrheumatic aortic (valve) stenosis: Secondary | ICD-10-CM | POA: Diagnosis not present

## 2018-09-10 DIAGNOSIS — E785 Hyperlipidemia, unspecified: Secondary | ICD-10-CM | POA: Diagnosis not present

## 2018-09-10 DIAGNOSIS — Z7901 Long term (current) use of anticoagulants: Secondary | ICD-10-CM | POA: Diagnosis not present

## 2018-09-10 LAB — CUP PACEART REMOTE DEVICE CHECK
Date Time Interrogation Session: 20191010033916
Implantable Pulse Generator Implant Date: 20161101

## 2018-09-10 NOTE — Patient Instructions (Addendum)
Medication Instructions:  Your physician recommends that you continue on your current medications as directed. Please refer to the Current Medication list given to you today.  If you need a refill on your cardiac medications before your next appointment, please call your pharmacy.   Lab work: Lab work to be done today--BMP If you have labs (blood work) drawn today and your tests are completely normal, you will receive your results only by: Marland Kitchen MyChart Message (if you have MyChart) OR . A paper copy in the mail If you have any lab test that is abnormal or we need to change your treatment, we will call you to review the results.  Testing/Procedures: Your physician has requested that you have an echocardiogram. Echocardiography is a painless test that uses sound waves to create images of your heart. It provides your doctor with information about the size and shape of your heart and how well your heart's chambers and valves are working. This procedure takes approximately one hour. There are no restrictions for this procedure.  Your physician has requested that you have cardiac CT. Cardiac computed tomography (CT) is a painless test that uses an x-ray machine to take clear, detailed pictures of your heart. For further information please visit HugeFiesta.[email or web address]. Please follow instruction sheet as given.     Follow-Up: At [facility], you and your health needs are our priority.  As part of our continuing mission to provide you with exceptional heart care, we have created designated Provider Care Teams.  These Care Teams include your primary Cardiologist (physician) and Advanced Practice Providers (APPs -  Physician Assistants and Nurse Practitioners) who all work together to provide you with the care you need, when you need it. You will need a follow up appointment in 12 months.  Please call our office 2 months in advance to schedule this appointment.  You may see [name], MD or one of  the following Advanced Practice Providers on your designated Care Team:   Truitt Merle, NP Cecilie Kicks, NP . Kathyrn Drown, NP  Any Other Special Instructions Will Be Listed Below (If Applicable).    Please arrive at the [facility] main entrance of [facility] at  AM (30-45 minutes prior to test start time)  [facility] [place], [place] [address] [phone]  Proceed to the [facility] Radiology Department (First Floor).  Please follow these instructions carefully (unless otherwise directed):  Hold all erectile dysfunction medications at least 48 hours prior to test.  On the Night Before the Test: . Be sure to Drink plenty of water. . Do not consume any caffeinated/decaffeinated beverages or chocolate 12 hours prior to your test. . Do not take any antihistamines 12 hours prior to your test. . If you take Metformin do not take 24 hours prior to test. IOn the Day of the Test: . Drink plenty of water. Do not drink any water within one hour of the test. . Do not eat any food 4 hours prior to the test. . You may take your regular medications prior to the test.  Take metoprolol 2 hours prior to test         After the Test: . Drink plenty of water. . After receiving IV contrast, you may experience a mild flushed feeling. This is normal. . On occasion, you may experience a mild rash up to 24 hours after the test. This is not dangerous. If this occurs, you can take Benadryl 25  mg and increase your fluid intake. . If you experience trouble breathing, this can be serious. If it is severe call 911 IMMEDIATELY. If it is mild, please call our office. . If you take any of these medications: Glipizide/Metformin, Avandament, Glucavance, please do not take 48 hours after completing test.

## 2018-09-11 LAB — BASIC METABOLIC PANEL
BUN/Creatinine Ratio: 21 (ref 10–24)
BUN: 16 mg/dL (ref 8–27)
CO2: 23 mmol/L (ref 20–29)
Calcium: 9.4 mg/dL (ref 8.6–10.2)
Chloride: 101 mmol/L (ref 96–106)
Creatinine, Ser: 0.78 mg/dL (ref 0.76–1.27)
GFR calc Af Amer: 107 mL/min/{1.73_m2} (ref >59)
GFR calc non Af Amer: 93 mL/min/{1.73_m2} (ref >59)
Glucose: 144 mg/dL — ABNORMAL HIGH (ref 65–99)
Potassium: 4.2 mmol/L (ref 3.5–5.2)
Sodium: 141 mmol/L (ref 134–144)

## 2018-09-13 ENCOUNTER — Ambulatory Visit (HOSPITAL_COMMUNITY): Payer: PPO | Attending: Cardiology

## 2018-09-13 ENCOUNTER — Other Ambulatory Visit: Payer: Self-pay

## 2018-09-13 DIAGNOSIS — I480 Paroxysmal atrial fibrillation: Secondary | ICD-10-CM | POA: Insufficient documentation

## 2018-09-13 DIAGNOSIS — I350 Nonrheumatic aortic (valve) stenosis: Secondary | ICD-10-CM | POA: Diagnosis not present

## 2018-09-14 ENCOUNTER — Encounter (HOSPITAL_COMMUNITY): Payer: Self-pay | Admitting: Emergency Medicine

## 2018-09-14 ENCOUNTER — Emergency Department (HOSPITAL_COMMUNITY): Payer: PPO

## 2018-09-14 ENCOUNTER — Telehealth: Payer: Self-pay | Admitting: Interventional Cardiology

## 2018-09-14 ENCOUNTER — Emergency Department (HOSPITAL_COMMUNITY)
Admission: EM | Admit: 2018-09-14 | Discharge: 2018-09-14 | Discharge status: Against Medical Advice | Payer: PPO | Attending: Emergency Medicine | Admitting: Emergency Medicine

## 2018-09-14 ENCOUNTER — Other Ambulatory Visit: Payer: Self-pay

## 2018-09-14 DIAGNOSIS — R0789 Other chest pain: Secondary | ICD-10-CM | POA: Diagnosis not present

## 2018-09-14 DIAGNOSIS — E119 Type 2 diabetes mellitus without complications: Secondary | ICD-10-CM | POA: Insufficient documentation

## 2018-09-14 DIAGNOSIS — I10 Essential (primary) hypertension: Secondary | ICD-10-CM | POA: Diagnosis not present

## 2018-09-14 DIAGNOSIS — Z7901 Long term (current) use of anticoagulants: Secondary | ICD-10-CM | POA: Insufficient documentation

## 2018-09-14 DIAGNOSIS — Z79899 Other long term (current) drug therapy: Secondary | ICD-10-CM | POA: Diagnosis not present

## 2018-09-14 DIAGNOSIS — R079 Chest pain, unspecified: Secondary | ICD-10-CM | POA: Diagnosis not present

## 2018-09-14 DIAGNOSIS — Z8679 Personal history of other diseases of the circulatory system: Secondary | ICD-10-CM | POA: Diagnosis not present

## 2018-09-14 DIAGNOSIS — J45909 Unspecified asthma, uncomplicated: Secondary | ICD-10-CM | POA: Diagnosis not present

## 2018-09-14 LAB — BASIC METABOLIC PANEL
Anion gap: 7 (ref 5–15)
BUN: 13 mg/dL (ref 8–23)
CO2: 26 mmol/L (ref 22–32)
Calcium: 9.1 mg/dL (ref 8.9–10.3)
Chloride: 105 mmol/L (ref 98–111)
Creatinine, Ser: 0.89 mg/dL (ref 0.61–1.24)
GFR calc Af Amer: >60 mL/min (ref >60)
GFR calc non Af Amer: >60 mL/min (ref >60)
Glucose, Bld: 151 mg/dL — ABNORMAL HIGH (ref 70–99)
Potassium: 3.9 mmol/L (ref 3.5–5.1)
Sodium: 138 mmol/L (ref 135–145)

## 2018-09-14 LAB — I-STAT TROPONIN, ED
Troponin i, poc: 0.01 ng/mL (ref 0.00–0.08)
Troponin i, poc: 0.01 ng/mL (ref 0.00–0.08)

## 2018-09-14 LAB — CBC
HCT: 44.7 % (ref 39.0–52.0)
Hemoglobin: 14.9 g/dL (ref 13.0–17.0)
MCH: 31.4 pg (ref 26.0–34.0)
MCHC: 33.3 g/dL (ref 30.0–36.0)
MCV: 94.3 fL (ref 80.0–100.0)
Platelets: 218 10*3/uL (ref 150–400)
RBC: 4.74 MIL/uL (ref 4.22–5.81)
RDW: 12.5 % (ref 11.5–15.5)
WBC: 11 10*3/uL — ABNORMAL HIGH (ref 4.0–10.5)
nRBC: 0 % (ref 0.0–0.2)

## 2018-09-14 NOTE — Telephone Encounter (Signed)
Pt states about an hour ago he developed terrible CP.  He rested and it improved some but has not gone away.  Denies SOB or dizziness.  Did become diaphoretic when pain started.  Pt very concerned with pain still lingering.  Advised pt to go to ER for eval.  He states wife will bring him over.  Advised EMS.  Pt verbalized understanding and states wife will bring him right over and he will call EMS if any changes.

## 2018-09-14 NOTE — Telephone Encounter (Signed)
Pt c/o of Chest Pain: STAT if CP now or developed within 24 hours  1. Are you having CP right now? Yes-left breast  Today it is on a scale of about 31/2 to a 4 2. Are you experiencing any other symptoms (ex. SOB, nausea, vomiting, sweating)?no  3. How long have you been experiencing CP? Over 1 hour  4. Is your CP continuous or coming and going? Continuous  5. Have you taken Nitroglycerin? No- do not have any ?

## 2018-09-14 NOTE — ED Provider Notes (Signed)
[facility] EMERGENCY DEPARTMENT Provider Note   CSN: [id number] Arrival date & time: 09/14/18  1543     History   Chief Complaint Chief Complaint  Patient presents with  . Chest Pain    HPI Jeremy Tucker is a 67 y.o. male.  The history is provided by the patient.  Chest Pain   This is a recurrent problem. The current episode started more than 2 days ago. The problem occurs every several days. The problem has been resolved. The pain is associated with rest. The pain is present in the lateral region. The pain is at a severity of 6/10. The pain is moderate. The quality of the pain is described as pressure-like. The pain does not radiate. Pertinent negatives include no abdominal pain, no back pain, no claudication, no cough, no diaphoresis, no fever, no hemoptysis, no irregular heartbeat, no lower extremity edema, no malaise/fatigue, no nausea, no near-syncope, no numbness, no palpitations, no shortness of breath, no syncope, no vomiting and no weakness. He has tried rest for the symptoms. The treatment provided moderate relief.  His past medical history is significant for diabetes, hyperlipidemia and hypertension.  Pertinent negatives for past medical history include no seizures. Past medical history comments: Afib on eloquis  Procedure history is negative for cardiac catheterization.    Past Medical History:  Diagnosis Date  . Aortic stenosis, mild   . BPH (benign prostatic hyperplasia)   . Bronchitis    patient mentions " i am prone to bronchitis"   . Controlled diabetes mellitus type 2 with complications ([place])   . Depression   . DJD (degenerative joint disease)   . DVT (deep venous thrombosis) ([place])    post op knee surgery in 2001 , behind calf  ; was resolved with blood thinners   . Dysrhythmia    Atrial fibrillation  . Gastric polyp 08/02/2018   see report in chart  . GERD (gastroesophageal reflux disease)   . Glucose intolerance (impaired glucose  tolerance)   . Gout    denies  . Hiatal hernia 08/02/2018   see report in chart  . Hyperlipidemia   . Hyperlipidemia associated with type 2 diabetes mellitus ([place])   . Hypertension    denies  . Hypertension associated with diabetes ([place])   . Kidney stones   . Obesity   . Persistent atrial fibrillation    a. CHADsVASc score at least 2  . Status post placement of implantable loop recorder 2 years ago ; 2016   left upper chest     Patient Active Problem List   Diagnosis Date Noted  . History of cataract extraction 08/21/2018  . Elevated PSA 02/19/2018  . S/P right TKA 10/30/2017  . Chronic anticoagulation 08/31/2017  . Insomnia 03/16/2017  . History of renal stone 06/10/2015  . Hypertension associated with diabetes ([place]) 05/25/2015  . Paroxysmal atrial fibrillation ([facility])   . Aortic stenosis, moderate   . OSA (obstructive sleep apnea) 03/09/2015  . GERD (gastroesophageal reflux disease)   . BPH (benign prostatic hyperplasia)   . Gout   . Diastolic dysfunction-grade 15 Sep 2014 01/21/2015  . Hyperlipidemia associated with type 2 diabetes mellitus ([place]) 12/05/2011  . Obesity (BMI 30-39.9) 12/05/2011  . Asthma 12/05/2011  . Arthritis 12/05/2011  . ED (erectile dysfunction) 12/05/2011    Past Surgical History:  Procedure Laterality Date  . ATRIAL FIBRILLATION ABLATION N/A 03/12/2015   Procedure: ATRIAL FIBRILLATION ABLATION;  Surgeon: [name], MD;  Location: [facility] CATH LAB;  Service: Cardiovascular;  Laterality: N/A;  . CARDIAC CATHETERIZATION  1990's   "Dr. Melvern Banker", no blockages per pt  . CARDIOVERSION N/A 02/25/2015   Procedure: CARDIOVERSION;  Surgeon: Sueanne Margarita, MD;  Location: [facility];  Service: Cardiovascular;  Laterality: N/A;  . COLONOSCOPY  2010   [facility]  . CYSTOSCOPY W/ STONE MANIPULATION  1980's   "couldn't get stone so they had to cut me open"  . CYSTOSCOPY WITH RETROGRADE PYELOGRAM, URETEROSCOPY AND STENT PLACEMENT Left 04/24/2015   Procedure:  URETHRAL MEATAL DILATION, CYSTOSCOPY WITH LEFT RETROGRADE PYELOGRAM, LEFT URETEROSCOPY, STONE BASKET EXTRACTION,  LEFT DOUBLE J STENT PLACEMENT;  Surgeon: Carolan Clines, MD;  Location: [facility];  Service: Urology;  Laterality: Left;  . EP IMPLANTABLE DEVICE N/A 09/15/2015   Procedure: Loop Recorder Insertion;  Surgeon: [name], MD;  Location: [facility] CV LAB;  Service: Cardiovascular;  Laterality: N/A;  . EYE SURGERY Bilateral 2016   cataract surgery with implant ; dr Gerald Stabs grote   . FOREARM FRACTURE SURGERY Right ~ 1961  . FRACTURE SURGERY    . HOLMIUM LASER APPLICATION Left 7/35/3299   Procedure: HOLMIUM LASER APPLICATION;  Surgeon: Carolan Clines, MD;  Location: [facility];  Service: Urology;  Laterality: Left;  . JOINT REPLACEMENT    . KIDNEY STONE SURGERY  1980's   "stone lodged in the duct; had to cut me open to get it"  . KNEE ARTHROSCOPY Bilateral "multiple times"  . TEE WITHOUT CARDIOVERSION N/A 03/12/2015   Procedure: TRANSESOPHAGEAL ECHOCARDIOGRAM (TEE);  Surgeon: Pixie Casino, MD;  Location: [facility] ENDOSCOPY;  Service: Cardiovascular;  Laterality: N/A;  . TONSILLECTOMY    . TOTAL KNEE ARTHROPLASTY Left 2001   [facility]  . TOTAL KNEE ARTHROPLASTY Right 10/30/2017   Procedure: RIGHT TOTAL KNEE ARTHROPLASTY;  Surgeon: Paralee Cancel, MD;  Location: [facility];  Service: Orthopedics;  Laterality: Right;  90 mins        Home Medications    Prior to Admission medications   Medication Sig Start Date End Date Taking? Authorizing Provider  acetaminophen (TYLENOL) 500 MG tablet Take 1,000 mg by mouth every 6 (six) hours as needed for headache.     [provider]  albuterol (PROVENTIL HFA;VENTOLIN HFA) 108 (90 Base) MCG/ACT inhaler Inhale 2 puffs into the lungs every 6 (six) hours as needed for wheezing or shortness of breath. 08/09/17   Tysinger, Camelia Eng, PA-C  allopurinol (ZYLOPRIM) 300 MG tablet Take 300 mg by mouth daily.      [provider]  ammonium lactate  (LAC-HYDRIN) 12 % lotion Apply 1 application topically daily as needed for dry skin (3-4 TIMES WEEK).     [provider]  atorvastatin (LIPITOR) 20 MG tablet Take 1 tablet (20 mg total) by mouth daily. 02/20/18   Denita Lung, MD  buPROPion (WELLBUTRIN XL) 300 MG 24 hr tablet TAKE 1 TABLET(300 MG) BY MOUTH DAILY 09/07/18   Denita Lung, MD  busPIRone (BUSPAR) 10 MG tablet TAKE 1 TABLET(10 MG) BY MOUTH DAILY 06/25/18   Denita Lung, MD  diltiazem (CARDIZEM CD) 180 MG 24 hr capsule Take 1 capsule (180 mg total) by mouth daily. Patient taking differently: Take 180 mg by mouth every evening.  05/01/17   Sherran Needs, NP  ELIQUIS 5 MG TABS tablet TAKE 1 TABLET BY MOUTH TWICE DAILY 08/23/18   Allred, Jeneen Rinks, MD  HYDROmorphone (DILAUDID) 2 MG tablet Take 1-2 tablets (2-4 mg total) by mouth every 6 (six) hours as needed for severe  pain (breakthrough pain). 10/30/17   Danae Orleans, PA-C  loratadine (CLARITIN) 10 MG tablet Take 10 mg by mouth daily.    [provider]  LORazepam (ATIVAN) 0.5 MG tablet TAKE 1 TABLET TWICE DAILY AS NEEDED FOR ANXIETY. 03/22/16   Denita Lung, MD  metoprolol succinate (TOPROL-XL) 25 MG 24 hr tablet TAKE 1 TABLET BY MOUTH DAILY IN EVENING 04/27/18   Sherran Needs, NP  Multiple Vitamins-Minerals (MULTIVITAMIN WITH MINERALS) tablet Take 1 tablet by mouth daily.      [provider]  olopatadine (PATANOL) 0.1 % ophthalmic solution Place 1 drop into the left eye as needed (tearing).    [provider]  Omega-3 Fatty Acids (FISH OIL TRIPLE STRENGTH) 1400 MG CAPS Take 1,400 mg by mouth daily.     [provider]  omeprazole (PRILOSEC) 20 MG capsule Take 20 mg by mouth daily.    [provider]  polyethylene glycol (MIRALAX / GLYCOLAX) packet Take 17 g by mouth 2 (two) times daily. 10/30/17   Danae Orleans, PA-C  Potassium Citrate 15 MEQ (1620 MG) TBCR Take 15 mEq by mouth daily. 08/08/16   [provider]    tamsulosin (FLOMAX) 0.4 MG CAPS capsule Take 0.4 mg by mouth at bedtime.    [provider]  triamcinolone cream (KENALOG) 0.1 % Apply 1 application topically.    [provider]  zolpidem (AMBIEN) 5 MG tablet Take 1 tablet (5 mg total) by mouth at bedtime as needed for sleep. 03/16/17   Denita Lung, MD    Family History Family History  Problem Relation Age of Onset  . Heart attack Mother 14       MI  . Heart attack Father 40       ? MI versus trauma to head  . Healthy Brother     Social History Social History   Tobacco Use  . Smoking status: Never Smoker  . Smokeless tobacco: Never Used  Substance Use Topics  . Alcohol use: Yes    Comment: seldom   . Drug use: No     Allergies   Codeine and Oxycodone   Review of Systems Review of Systems  Constitutional: Negative for chills, diaphoresis, fever and malaise/fatigue.  HENT: Negative for ear pain and sore throat.   Eyes: Negative for pain and visual disturbance.  Respiratory: Negative for cough, hemoptysis and shortness of breath.   Cardiovascular: Positive for chest pain. Negative for palpitations, claudication, syncope and near-syncope.  Gastrointestinal: Negative for abdominal pain, nausea and vomiting.  Genitourinary: Negative for dysuria and hematuria.  Musculoskeletal: Negative for arthralgias and back pain.  Skin: Negative for color change and rash.  Neurological: Negative for seizures, syncope, weakness and numbness.  All other systems reviewed and are negative.    Physical Exam Updated Vital Signs  ED Triage Vitals  Enc Vitals Group     BP 09/14/18 1549 (!) 109/57     Pulse Rate 09/14/18 1549 (!) 158     Resp 09/14/18 1549 18     Temp 09/14/18 1549 98.1 F (36.7 C)     Temp src --      SpO2 09/14/18 1549 100 %     Weight --      Height --      Head Circumference --      Peak Flow --      Pain Score 09/14/18 1550 1     Pain Loc --      Pain Edu? --  Excl. in [place]? --      Physical Exam  Constitutional: He is oriented to person, place, and time. He appears well-developed and well-nourished.  HENT:  Head: Normocephalic and atraumatic.  Eyes: Pupils are equal, round, and reactive to light. Conjunctivae and EOM are normal.  Neck: Normal range of motion. Neck supple.  Cardiovascular: Normal rate, regular rhythm, intact distal pulses and normal pulses.  No murmur heard. Pulmonary/Chest: Effort normal and breath sounds normal. No respiratory distress. He has no decreased breath sounds. He has no wheezes. He has no rhonchi. He has no rales.  Abdominal: Soft. Bowel sounds are normal. There is no tenderness.  Musculoskeletal: He exhibits no edema.       Right lower leg: He exhibits no edema.       Left lower leg: He exhibits no edema.  Neurological: He is alert and oriented to person, place, and time.  Skin: Skin is warm and dry.  Psychiatric: He has a normal mood and affect.  Nursing note and vitals reviewed.    ED Treatments / Results  Labs (all labs ordered are listed, but only abnormal results are displayed) Labs Reviewed  BASIC METABOLIC PANEL - Abnormal; Notable for the following components:      Result Value   Glucose, Bld 151 (*)    All other components within normal limits  CBC - Abnormal; Notable for the following components:   WBC 11.0 (*)    All other components within normal limits  I-STAT TROPONIN, ED  I-STAT TROPONIN, ED    EKG EKG Interpretation  Date/Time:  Friday September 14 2018 15:48:11 EDT Ventricular Rate:  71 PR Interval:  148 QRS Duration: 90 QT Interval:  392 QTC Calculation: 425 R Axis:   25 Text Interpretation:  Normal sinus rhythm Normal ECG Confirmed by Lennice Sites [phone]) on 09/14/2018 7:45:45 PM   Radiology Dg Chest 2 View  Result Date: 09/14/2018 CLINICAL DATA:  Ongoing left mid chest and breast pain. EXAM: CHEST - 2 VIEW COMPARISON:  01/26/2015 FINDINGS: The heart size and mediastinal contours are  within normal limits. Loop recording device projects over the normal sized cardiac silhouette. Both lungs are clear. Mild degenerative change along the thoracic spine without acute osseous abnormality. IMPRESSION: No active cardiopulmonary disease. Electronically Signed   By: Ashley Royalty M.D.   On: 09/14/2018 16:13    Procedures Procedures (including critical care time)  Medications Ordered in ED Medications - No data to display   Initial Impression / Assessment and Plan / ED Course  I have reviewed the triage vital signs and the nursing notes.  Pertinent labs & imaging results that were available during my care of the patient were reviewed by me and considered in my medical decision making (see chart for details).     SHEMAR PLEMMONS is a 67 year old male with history of high cholesterol, fibrillation on Eliquis, hypertension, high cholesterol presents to the ED with chest pain.  Patient with unremarkable vitals.  No fever.  EKG shows sinus rhythm.  No new ischemic changes.  Patient with chest pain on and off for the last several weeks.  Recently saw cardiologist and was scheduled for CT imaging to evaluate for coronary artery disease.  Patient had echocardiogram that showed some aortic stenosis but otherwise was unremarkable.  Patient had episode of chest pain today that has now resolved.  Patient denies any shortness of breath.  Has normal vitals.  No hypoxia.  Patient is on blood thinners.  No concern  for DVT or PE at this time.  Patient with unremarkable exam.  Clear breath sounds.  No signs of volume overload.  Troponins negative x2.  No significant anemia, electrolyte abnormality, kidney injury.  No significant leukocytosis.  Chest x-ray shows no signs of pneumonia, pneumothorax, pleural effusion.  Page cardiology several times but was unable to get a call back.  Patient left AGAINST MEDICAL ADVICE as he was upset about having to wait for further disposition planning.  Discussed with the  patient that I want to talk with his cardiologist about their plan as they appear to be pursuing outpatient work-up.  Patient has had this chest pain and was recently seen in clinic 2 days ago with similar type chest pain.  At this time he wants to leave [facility].  He states that he will call his cardiologist.  Patient eloped from the ED prior to me talking to cardiologist.  This chart was dictated using voice recognition software.  Despite best efforts to proofread,  errors can occur which can change the documentation meaning.   Final Clinical Impressions(s) / ED Diagnoses   Final diagnoses:  Chest pain, unspecified type    ED Discharge Orders    None       Lennice Sites, DO 09/15/18 0003

## 2018-09-14 NOTE — ED Notes (Signed)
Patient very upset with wait. Stated he "waited 4 hours to get a room and another 2 hours for Cardiology." Explained to patient the process and tried to provide support. Patient loudly stating "this is fucking ridiculous, I am going to rip all this shit off and go home." EDP notified and went to console patient but patient highly aggravated and expressing the want to leave. Patient left AMA and refused to sign any paperwork or signature pad.

## 2018-09-14 NOTE — Telephone Encounter (Signed)
I looked at the EKG done today and it looks fine.  I will stop by the ED to see him.

## 2018-09-14 NOTE — ED Triage Notes (Signed)
Pt reports left chest and breast pain that is ongoing. He sees Dr. Tamala Julian who has been monitoring. Recently had an echo. Today pain is more severe and he was diaphoretic. The patient is alert and oriented x4 with no acute distress at triage.

## 2018-09-17 ENCOUNTER — Other Ambulatory Visit: Payer: Self-pay | Admitting: Interventional Cardiology

## 2018-09-17 ENCOUNTER — Telehealth: Payer: Self-pay | Admitting: Interventional Cardiology

## 2018-09-17 DIAGNOSIS — R079 Chest pain, unspecified: Secondary | ICD-10-CM

## 2018-09-17 NOTE — Telephone Encounter (Signed)
   Please set up left heart cath with coronary angiogram and LV gram for Wednesday, 09/19/2018 at 11:30 AM.  The patient has been instructed to stop Eliquis this evening and have no more until after the procedure on Wednesday evening or Thursday morning.  He has had recent labs performed in the emergency room on Friday with normal hemoglobin and creatinine.  I have placed his name on the cath list at [facility] in the 1130 slot.  He will be a right radial approach.  He needs to be further instructed on preparation and what time to report.  I have written precath orders.

## 2018-09-17 NOTE — Telephone Encounter (Signed)
New Message   Pt states he is calling because he spoke with Anderson Malta B. On 09/14/18 and also went to the er for chest pain and that he only wants to speak with a nurse and would not explain further. Please call

## 2018-09-17 NOTE — Telephone Encounter (Signed)
Called pt and went over instructions for cath.  Message sent to pre cert.

## 2018-09-17 NOTE — Telephone Encounter (Signed)
Spoke with pt and is upset with ED experience was there hours and never seen cardiologist . Discussed with Dr Tamala Julian and Dr Tamala Julian will call pt ./cy

## 2018-09-18 ENCOUNTER — Telehealth: Payer: Self-pay | Admitting: *Deleted

## 2018-09-18 NOTE — Telephone Encounter (Signed)
Pt contacted pre-catheterization scheduled at [facility] for: Wednesday September 19, 2018 11:30 AM Verified arrival time and place: [place] Entrance A at: 9 AM  No solid food after midnight prior to cath, clear liquids until 5 AM day of procedure. Contrast allergy: no Verified no diabetes medications.  Hold: Eliquis-last dose AM 09/17/18 until post procedure.  Except hold medications AM meds can be  taken pre-cath with sip of water including: ASA 81 mg  Confirmed patient has responsible person to drive home post procedure and for 24 hours after you arrive home: yes

## 2018-09-19 ENCOUNTER — Other Ambulatory Visit: Payer: Self-pay

## 2018-09-19 ENCOUNTER — Telehealth: Payer: Self-pay

## 2018-09-19 ENCOUNTER — Encounter (HOSPITAL_COMMUNITY)
Admission: RE | Disposition: A | Discharge status: Home / Self Care | Payer: Self-pay | Source: Ambulatory Visit | Attending: Interventional Cardiology

## 2018-09-19 ENCOUNTER — Ambulatory Visit (HOSPITAL_COMMUNITY)
Admission: RE | Admit: 2018-09-19 | Discharge: 2018-09-19 | Disposition: A | Discharge status: Home / Self Care | Payer: PPO | Source: Ambulatory Visit | Attending: Interventional Cardiology | Admitting: Interventional Cardiology

## 2018-09-19 DIAGNOSIS — Z8249 Family history of ischemic heart disease and other diseases of the circulatory system: Secondary | ICD-10-CM | POA: Insufficient documentation

## 2018-09-19 DIAGNOSIS — Z79899 Other long term (current) drug therapy: Secondary | ICD-10-CM | POA: Diagnosis not present

## 2018-09-19 DIAGNOSIS — M109 Gout, unspecified: Secondary | ICD-10-CM | POA: Diagnosis not present

## 2018-09-19 DIAGNOSIS — I480 Paroxysmal atrial fibrillation: Secondary | ICD-10-CM | POA: Diagnosis not present

## 2018-09-19 DIAGNOSIS — N400 Enlarged prostate without lower urinary tract symptoms: Secondary | ICD-10-CM | POA: Insufficient documentation

## 2018-09-19 DIAGNOSIS — Z87442 Personal history of urinary calculi: Secondary | ICD-10-CM | POA: Insufficient documentation

## 2018-09-19 DIAGNOSIS — F419 Anxiety disorder, unspecified: Secondary | ICD-10-CM | POA: Diagnosis not present

## 2018-09-19 DIAGNOSIS — Z7901 Long term (current) use of anticoagulants: Secondary | ICD-10-CM

## 2018-09-19 DIAGNOSIS — I2510 Atherosclerotic heart disease of native coronary artery without angina pectoris: Secondary | ICD-10-CM | POA: Diagnosis not present

## 2018-09-19 DIAGNOSIS — Z9889 Other specified postprocedural states: Secondary | ICD-10-CM | POA: Diagnosis not present

## 2018-09-19 DIAGNOSIS — R079 Chest pain, unspecified: Secondary | ICD-10-CM

## 2018-09-19 DIAGNOSIS — K219 Gastro-esophageal reflux disease without esophagitis: Secondary | ICD-10-CM | POA: Diagnosis not present

## 2018-09-19 DIAGNOSIS — Z96653 Presence of artificial knee joint, bilateral: Secondary | ICD-10-CM | POA: Diagnosis not present

## 2018-09-19 DIAGNOSIS — Z960 Presence of urogenital implants: Secondary | ICD-10-CM | POA: Diagnosis not present

## 2018-09-19 DIAGNOSIS — E119 Type 2 diabetes mellitus without complications: Secondary | ICD-10-CM | POA: Insufficient documentation

## 2018-09-19 DIAGNOSIS — E785 Hyperlipidemia, unspecified: Secondary | ICD-10-CM | POA: Diagnosis not present

## 2018-09-19 DIAGNOSIS — M1990 Unspecified osteoarthritis, unspecified site: Secondary | ICD-10-CM | POA: Insufficient documentation

## 2018-09-19 DIAGNOSIS — G4733 Obstructive sleep apnea (adult) (pediatric): Secondary | ICD-10-CM | POA: Diagnosis not present

## 2018-09-19 DIAGNOSIS — E669 Obesity, unspecified: Secondary | ICD-10-CM | POA: Insufficient documentation

## 2018-09-19 DIAGNOSIS — F329 Major depressive disorder, single episode, unspecified: Secondary | ICD-10-CM | POA: Insufficient documentation

## 2018-09-19 DIAGNOSIS — I10 Essential (primary) hypertension: Secondary | ICD-10-CM | POA: Diagnosis not present

## 2018-09-19 DIAGNOSIS — I350 Nonrheumatic aortic (valve) stenosis: Secondary | ICD-10-CM

## 2018-09-19 DIAGNOSIS — Z6832 Body mass index (BMI) 32.0-32.9, adult: Secondary | ICD-10-CM | POA: Diagnosis not present

## 2018-09-19 DIAGNOSIS — I25119 Atherosclerotic heart disease of native coronary artery with unspecified angina pectoris: Secondary | ICD-10-CM | POA: Insufficient documentation

## 2018-09-19 DIAGNOSIS — Z885 Allergy status to narcotic agent status: Secondary | ICD-10-CM | POA: Insufficient documentation

## 2018-09-19 DIAGNOSIS — Z86718 Personal history of other venous thrombosis and embolism: Secondary | ICD-10-CM | POA: Insufficient documentation

## 2018-09-19 DIAGNOSIS — E1169 Type 2 diabetes mellitus with other specified complication: Secondary | ICD-10-CM | POA: Diagnosis present

## 2018-09-19 HISTORY — PX: LEFT HEART CATH AND CORONARY ANGIOGRAPHY: CATH118249

## 2018-09-19 SURGERY — LEFT HEART CATH AND CORONARY ANGIOGRAPHY
Anesthesia: LOCAL

## 2018-09-19 MED ORDER — FENTANYL CITRATE (PF) 100 MCG/2ML IJ SOLN
INTRAMUSCULAR | Status: AC
  Filled 2018-09-19: qty 2

## 2018-09-19 MED ORDER — VERAPAMIL HCL 2.5 MG/ML IV SOLN
INTRAVENOUS | Status: AC
  Filled 2018-09-19: qty 2

## 2018-09-19 MED ORDER — SODIUM CHLORIDE 0.9% FLUSH: 3.0000 mL | Freq: Two times a day (BID) | INTRAVENOUS | Status: DC

## 2018-09-19 MED ORDER — SODIUM CHLORIDE 0.9 % WEIGHT BASED INFUSION
3.0000 mL/kg/h | INTRAVENOUS | Status: AC
  Administered 2018-09-19: 3 mL/kg/h via INTRAVENOUS

## 2018-09-19 MED ORDER — SODIUM CHLORIDE 0.9 % IV SOLN: INTRAVENOUS | Status: DC

## 2018-09-19 MED ORDER — FENTANYL CITRATE (PF) 100 MCG/2ML IJ SOLN
INTRAMUSCULAR | Status: DC | PRN
  Administered 2018-09-19 (×2): 25 ug via INTRAVENOUS

## 2018-09-19 MED ORDER — SODIUM CHLORIDE 0.9% FLUSH: 3.0000 mL | INTRAVENOUS | Status: DC | PRN

## 2018-09-19 MED ORDER — SODIUM CHLORIDE 0.9 % WEIGHT BASED INFUSION: 1.0000 mL/kg/h | INTRAVENOUS | Status: DC

## 2018-09-19 MED ORDER — MIDAZOLAM HCL 2 MG/2ML IJ SOLN
INTRAMUSCULAR | Status: AC
  Filled 2018-09-19: qty 2

## 2018-09-19 MED ORDER — VERAPAMIL HCL 2.5 MG/ML IV SOLN
INTRAVENOUS | Status: DC | PRN
  Administered 2018-09-19: 10 mL via INTRA_ARTERIAL

## 2018-09-19 MED ORDER — LIDOCAINE HCL (PF) 1 % IJ SOLN
INTRAMUSCULAR | Status: AC
  Filled 2018-09-19: qty 30

## 2018-09-19 MED ORDER — ONDANSETRON HCL 4 MG/2ML IJ SOLN: 4.0000 mg | Freq: Four times a day (QID) | INTRAMUSCULAR | Status: DC | PRN

## 2018-09-19 MED ORDER — HEPARIN (PORCINE) IN NACL 1000-0.9 UT/500ML-% IV SOLN
INTRAVENOUS | Status: AC
  Filled 2018-09-19: qty 1000

## 2018-09-19 MED ORDER — ACETAMINOPHEN 325 MG PO TABS: 650.0000 mg | ORAL_TABLET | ORAL | Status: DC | PRN

## 2018-09-19 MED ORDER — SODIUM CHLORIDE 0.9 % IV SOLN: 250.0000 mL | INTRAVENOUS | Status: DC | PRN

## 2018-09-19 MED ORDER — HEPARIN SODIUM (PORCINE) 1000 UNIT/ML IJ SOLN
INTRAMUSCULAR | Status: DC | PRN
  Administered 2018-09-19: 5000 [IU] via INTRAVENOUS

## 2018-09-19 MED ORDER — MIDAZOLAM HCL 2 MG/2ML IJ SOLN
INTRAMUSCULAR | Status: DC | PRN
  Administered 2018-09-19: 0.5 mg via INTRAVENOUS
  Administered 2018-09-19 (×2): 1 mg via INTRAVENOUS

## 2018-09-19 MED ORDER — ASPIRIN 81 MG PO CHEW: 81.0000 mg | CHEWABLE_TABLET | Freq: Once | ORAL | Status: DC

## 2018-09-19 MED ORDER — IOHEXOL 350 MG/ML SOLN
INTRAVENOUS | Status: DC | PRN
  Administered 2018-09-19: 55 mL via INTRA_ARTERIAL

## 2018-09-19 MED ORDER — LIDOCAINE HCL (PF) 1 % IJ SOLN
INTRAMUSCULAR | Status: DC | PRN
  Administered 2018-09-19: 2 mL

## 2018-09-19 MED ORDER — HEPARIN (PORCINE) IN NACL 1000-0.9 UT/500ML-% IV SOLN
INTRAVENOUS | Status: DC | PRN
  Administered 2018-09-19 (×2): 500 mL

## 2018-09-19 SURGICAL SUPPLY — 13 items
CATH 5FR JL3.5 JR4 ANG PIG MP (CATHETERS) ×2 IMPLANT
CATH LAUNCHER 5F RADR (CATHETERS) ×1 IMPLANT
CATHETER LAUNCHER 5F RADR (CATHETERS) ×2
DEVICE RAD COMP TR BAND LRG (VASCULAR PRODUCTS) ×2 IMPLANT
GLIDESHEATH SLEND A-KIT 6F 22G (SHEATH) ×2 IMPLANT
GUIDEWIRE INQWIRE 1.5J.035X260 (WIRE) ×1 IMPLANT
INQWIRE 1.5J .035X260CM (WIRE) ×2
KIT HEART LEFT (KITS) ×2 IMPLANT
PACK CARDIAC CATHETERIZATION (CUSTOM PROCEDURE TRAY) ×2 IMPLANT
SHEATH PROBE COVER 6X72 (BAG) ×2 IMPLANT
TRANSDUCER W/STOPCOCK (MISCELLANEOUS) ×2 IMPLANT
TUBING CIL FLEX 10 FLL-RA (TUBING) ×2 IMPLANT
WIRE EMERALD ST .035X260CM (WIRE) ×2 IMPLANT

## 2018-09-19 NOTE — CV Procedure (Signed)
   Coronary angiography and left ventricular hemodynamic assessment performed via the right radial approach with difficulty due to marked subclavian/innominate/ascending aortic angulation.  30% proximal RCA.  Coronaries otherwise normal.  33 mm peak to peak transaortic valve gradient.  This is recognized from echo done earlier this month and essentially equivalent.  Heavily calcified aortic valve with decreased leaflet mobility.  No complications to this point.  Future angiography should be considered from left radial or femoral approach although the procedure can be done from the right radial but is technically challenging.

## 2018-09-19 NOTE — Interval H&P Note (Signed)
Cath Lab Visit (complete for each Cath Lab visit)  Clinical Evaluation Leading to the Procedure:   ACS: No.  Non-ACS:    Anginal Classification: CCS Tucker  Anti-ischemic medical therapy: Minimal Therapy (1 class of medications)  Non-Invasive Test Results: No non-invasive testing performed  Prior CABG: No previous CABG      History and Physical Interval Note:  09/19/2018 11:34 AM  Jeremy Tucker  has presented today for surgery, with the diagnosis of chest pain  The various methods of treatment have been discussed with the patient and family. After consideration of risks, benefits and other options for treatment, the patient has consented to  Procedure(s): LEFT HEART CATH AND CORONARY ANGIOGRAPHY (N/A) as a surgical intervention .  The patient's history has been reviewed, patient examined, no change in status, stable for surgery.  I have reviewed the patient's chart and labs.  Questions were answered to the patient's satisfaction.     Jeremy Tucker

## 2018-09-19 NOTE — Telephone Encounter (Signed)
LMOVM requesting that pt send manual transmission b/c home monitor has not updated in at least 14 days.    

## 2018-09-19 NOTE — Progress Notes (Addendum)
Client c/o sudden 8/10 left arm pain from elbow to wrist; Lindsay,PA notified and in to see client; and client states pain decreased to 2/10; IV left posterior forearm d/ced

## 2018-09-19 NOTE — Discharge Instructions (Signed)
Radial Site Care °Refer to this sheet in the next few weeks. These instructions provide you with information about caring for yourself after your procedure. Your health care provider may also give you more specific instructions. Your treatment has been planned according to current medical practices, but problems sometimes occur. Call your health care provider if you have any problems or questions after your procedure. °What can I expect after the procedure? °After your procedure, it is typical to have the following: °· Bruising at the radial site that usually fades within 1-2 weeks. °· Blood collecting in the tissue (hematoma) that may be painful to the touch. It should usually decrease in size and tenderness within 1-2 weeks. ° °Follow these instructions at home: °· Take medicines only as directed by your health care provider. °· You may shower 24-48 hours after the procedure or as directed by your health care provider. Remove the bandage (dressing) and gently wash the site with plain soap and water. Pat the area dry with a clean towel. Do not rub the site, because this may cause bleeding. °· Do not take baths, swim, or use a hot tub until your health care provider approves. °· Check your insertion site every day for redness, swelling, or drainage. °· Do not apply powder or lotion to the site. °· Do not flex or bend the affected arm for 24 hours or as directed by your health care provider. °· Do not push or pull heavy objects with the affected arm for 24 hours or as directed by your health care provider. °· Do not lift over 10 lb (4.5 kg) for 5 days after your procedure or as directed by your health care provider. °· Ask your health care provider when it is okay to: °? Return to work or school. °? Resume usual physical activities or sports. °? Resume sexual activity. °· Do not drive home if you are discharged the same day as the procedure. Have someone else drive you. °· You may drive 24 hours after the procedure  unless otherwise instructed by your health care provider. °· Do not operate machinery or power tools for 24 hours after the procedure. °· If your procedure was done as an outpatient procedure, which means that you went home the same day as your procedure, a responsible adult should be with you for the first 24 hours after you arrive home. °· Keep all follow-up visits as directed by your health care provider. This is important. °Contact a health care provider if: °· You have a fever. °· You have chills. °· You have increased bleeding from the radial site. Hold pressure on the site. °Get help right away if: °· You have unusual pain at the radial site. °· You have redness, warmth, or swelling at the radial site. °· You have drainage (other than a small amount of blood on the dressing) from the radial site. °· The radial site is bleeding, and the bleeding does not stop after 30 minutes of holding steady pressure on the site. °· Your arm or hand becomes pale, cool, tingly, or numb. °This information is not intended to replace advice given to you by your health care provider. Make sure you discuss any questions you have with your health care provider. °Document Released: 12/03/2010 Document Revised: 04/07/2016 Document Reviewed: 05/19/2014 °Elsevier Interactive Patient Education © 2018 Elsevier Inc. ° ° ° °Moderate Conscious Sedation, Adult, Care After °These instructions provide you with information about caring for yourself after your procedure. Your health care provider   may also give you more specific instructions. Your treatment has been planned according to current medical practices, but problems sometimes occur. Call your health care provider if you have any problems or questions after your procedure. °What can I expect after the procedure? °After your procedure, it is common: °· To feel sleepy for several hours. °· To feel clumsy and have poor balance for several hours. °· To have poor judgment for several  hours. °· To vomit if you eat too soon. ° °Follow these instructions at home: °For at least 24 hours after the procedure: ° °· Do not: °? Participate in activities where you could fall or become injured. °? Drive. °? Use heavy machinery. °? Drink alcohol. °? Take sleeping pills or medicines that cause drowsiness. °? Make important decisions or sign legal documents. °? Take care of children on your own. °· Rest. °Eating and drinking °· Follow the diet recommended by your health care provider. °· If you vomit: °? Drink water, juice, or soup when you can drink without vomiting. °? Make sure you have little or no nausea before eating solid foods. °General instructions °· Have a responsible adult stay with you until you are awake and alert. °· Take over-the-counter and prescription medicines only as told by your health care provider. °· If you smoke, do not smoke without supervision. °· Keep all follow-up visits as told by your health care provider. This is important. °Contact a health care provider if: °· You keep feeling nauseous or you keep vomiting. °· You feel light-headed. °· You develop a rash. °· You have a fever. °Get help right away if: °· You have trouble breathing. °This information is not intended to replace advice given to you by your health care provider. Make sure you discuss any questions you have with your health care provider. °Document Released: 08/21/2013 Document Revised: 04/04/2016 Document Reviewed: 02/20/2016 °Elsevier Interactive Patient Education © 2018 Elsevier Inc. ° °

## 2018-09-20 ENCOUNTER — Encounter (HOSPITAL_COMMUNITY): Payer: Self-pay | Admitting: Interventional Cardiology

## 2018-09-20 ENCOUNTER — Telehealth: Payer: Self-pay | Admitting: *Deleted

## 2018-09-20 DIAGNOSIS — R079 Chest pain, unspecified: Secondary | ICD-10-CM

## 2018-09-20 NOTE — Telephone Encounter (Signed)
CTA ordered °

## 2018-09-20 NOTE — Telephone Encounter (Signed)
-----   Message from Belva Crome, MD sent at 09/19/2018  2:51 PM EST ----- [name] needs an aortic CT angiogram to rule out ascending aortic aneurysm..  Counseled the coronary CTA that was previously scheduled.

## 2018-09-24 ENCOUNTER — Ambulatory Visit (INDEPENDENT_AMBULATORY_CARE_PROVIDER_SITE_OTHER): Payer: PPO | Admitting: *Deleted

## 2018-09-24 DIAGNOSIS — I480 Paroxysmal atrial fibrillation: Secondary | ICD-10-CM | POA: Diagnosis not present

## 2018-09-24 DIAGNOSIS — I10 Essential (primary) hypertension: Secondary | ICD-10-CM

## 2018-09-25 ENCOUNTER — Ambulatory Visit (INDEPENDENT_AMBULATORY_CARE_PROVIDER_SITE_OTHER)
Admission: RE | Admit: 2018-09-25 | Discharge: 2018-09-25 | Disposition: A | Discharge status: Home / Self Care | Payer: PPO | Source: Ambulatory Visit | Attending: Interventional Cardiology | Admitting: Interventional Cardiology

## 2018-09-25 DIAGNOSIS — R079 Chest pain, unspecified: Secondary | ICD-10-CM

## 2018-09-25 MED ORDER — IOPAMIDOL (ISOVUE-370) INJECTION 76%
100.0000 mL | Freq: Once | INTRAVENOUS | Status: AC | PRN
  Administered 2018-09-25: 100 mL via INTRAVENOUS

## 2018-09-25 NOTE — Progress Notes (Signed)
Carelink Summary Report / Loop Recorder 

## 2018-10-26 ENCOUNTER — Encounter: Payer: Self-pay | Admitting: Family Medicine

## 2018-10-26 ENCOUNTER — Ambulatory Visit (INDEPENDENT_AMBULATORY_CARE_PROVIDER_SITE_OTHER): Payer: PPO | Admitting: Family Medicine

## 2018-10-26 VITALS — BP 130/82 | HR 81 | Temp 98.3°F | Wt 223.8 lb

## 2018-10-26 DIAGNOSIS — F32A Depression, unspecified: Secondary | ICD-10-CM

## 2018-10-26 DIAGNOSIS — M1711 Unilateral primary osteoarthritis, right knee: Secondary | ICD-10-CM | POA: Diagnosis not present

## 2018-10-26 DIAGNOSIS — F329 Major depressive disorder, single episode, unspecified: Secondary | ICD-10-CM | POA: Diagnosis not present

## 2018-10-26 DIAGNOSIS — F419 Anxiety disorder, unspecified: Secondary | ICD-10-CM | POA: Diagnosis not present

## 2018-10-26 MED ORDER — SERTRALINE HCL 50 MG PO TABS: 50.0000 mg | ORAL_TABLET | Freq: Every day | ORAL | 3 refills | Status: DC
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
## 2018-10-29 ENCOUNTER — Telehealth: Payer: Self-pay | Admitting: Family Medicine

## 2018-10-29 ENCOUNTER — Ambulatory Visit (INDEPENDENT_AMBULATORY_CARE_PROVIDER_SITE_OTHER): Payer: PPO

## 2018-10-29 DIAGNOSIS — I480 Paroxysmal atrial fibrillation: Secondary | ICD-10-CM

## 2018-10-29 NOTE — Telephone Encounter (Signed)
Pt called and states he would like for you to call him he would not tell me what about, please call at [phone]

## 2018-10-29 NOTE — Progress Notes (Signed)
Carelink Summary Report / Loop Recorder 

## 2018-11-08 DIAGNOSIS — M545 Low back pain: Secondary | ICD-10-CM | POA: Diagnosis not present

## 2018-11-13 LAB — CUP PACEART REMOTE DEVICE CHECK
Date Time Interrogation Session: 20191112033559
Implantable Pulse Generator Implant Date: 20161101

## 2018-11-19 ENCOUNTER — Encounter: Payer: Self-pay | Admitting: Family Medicine

## 2018-11-19 ENCOUNTER — Ambulatory Visit (INDEPENDENT_AMBULATORY_CARE_PROVIDER_SITE_OTHER): Payer: PPO | Admitting: Family Medicine

## 2018-11-19 VITALS — BP 122/82 | HR 70 | Temp 98.3°F | Wt 228.0 lb

## 2018-11-19 DIAGNOSIS — F329 Major depressive disorder, single episode, unspecified: Secondary | ICD-10-CM | POA: Diagnosis not present

## 2018-11-19 DIAGNOSIS — F419 Anxiety disorder, unspecified: Secondary | ICD-10-CM | POA: Diagnosis not present

## 2018-11-19 DIAGNOSIS — F32A Depression, unspecified: Secondary | ICD-10-CM

## 2018-11-19 NOTE — Progress Notes (Signed)
   Subjective:    Patient ID: Jeremy Tucker, male    DOB: [birth year], 68 y.o.   MRN: [id number]  HPI He is here for a recheck.  He states that he is roughly 80% better.  He has not used Ambien or lorazepam.  Is doing well on Wellbutrin and sertraline.  He is scheduled to see his therapist in the near future.  Presently he is also having some L5 radiculopathy but is being followed by Dr. Alvan Dame for this.   Review of Systems     Objective:   Physical Exam Alert and in no distress otherwise not examined       Assessment & Plan:  Anxiety and depression Encouraged him to continue in counseling and stay on his present medications.  Recheck here in roughly 6 weeks to see if he is getting any better.  Explained to 80% is good but not good enough.  He will also follow-up with orthopedics concerning his L5 radiculopathy.

## 2018-11-22 ENCOUNTER — Telehealth: Payer: Self-pay | Admitting: Family Medicine

## 2018-11-22 MED ORDER — DIAZEPAM 5 MG PO TABS: 5.0000 mg | ORAL_TABLET | Freq: Two times a day (BID) | ORAL | 0 refills | Status: DC | PRN

## 2018-11-22 NOTE — Telephone Encounter (Signed)
Pt called and would like you to call him he is going to have his MRI and he would like a RX for Diazepam 5mg  states he only got 1 pill but one like 1 more, to calm him nerves, he would like to talk to you about tomorrow pt can be reached at [phone] and pt uses [facility], [address] AT [address] [place]

## 2018-11-23 DIAGNOSIS — M5416 Radiculopathy, lumbar region: Secondary | ICD-10-CM | POA: Diagnosis not present

## 2018-11-27 ENCOUNTER — Telehealth: Payer: Self-pay

## 2018-11-27 NOTE — Telephone Encounter (Signed)
Patient called requesting a call from his pcp. He said to inform you that his Bronchitis is acting up. Please advise.

## 2018-11-30 ENCOUNTER — Ambulatory Visit (INDEPENDENT_AMBULATORY_CARE_PROVIDER_SITE_OTHER): Payer: PPO

## 2018-11-30 DIAGNOSIS — I480 Paroxysmal atrial fibrillation: Secondary | ICD-10-CM

## 2018-11-30 NOTE — Telephone Encounter (Signed)
I called the patient.  2 weeks ago he developed a foot drop, there has been no pain or discomfort with this, he was seen by Dr. Sherwood Gambler and MRI of the lumbar spine was done and was unremarkable.  The patient was treated with a course of prednisone by Dr. Alvan Dame.  Dr. Alvan Dame has made a referral to our office, will get the patient worked in sometime in the next week or 2.

## 2018-11-30 NOTE — Telephone Encounter (Signed)
Jeremy Tucker called and said he is being referred to you by Dr Paralee Cancel for drop foot. I do not have the referral yet. He said he knows you personally and wants you to call him. Call the mobile number.

## 2018-12-01 LAB — CUP PACEART REMOTE DEVICE CHECK
Date Time Interrogation Session: 20200117104209
Implantable Pulse Generator Implant Date: 20161101

## 2018-12-02 ENCOUNTER — Other Ambulatory Visit: Payer: Self-pay | Admitting: Internal Medicine

## 2018-12-02 LAB — CUP PACEART REMOTE DEVICE CHECK
Date Time Interrogation Session: 20191215084048
Implantable Pulse Generator Implant Date: 20161101

## 2018-12-03 ENCOUNTER — Telehealth: Payer: Self-pay | Admitting: Family Medicine

## 2018-12-03 MED ORDER — ALBUTEROL SULFATE HFA 108 (90 BASE) MCG/ACT IN AERS: 2.0000 | INHALATION_SPRAY | Freq: Four times a day (QID) | RESPIRATORY_TRACT | 0 refills | Status: DC | PRN

## 2018-12-03 NOTE — Progress Notes (Signed)
Carelink Summary Report / Loop Recorder 

## 2018-12-03 NOTE — Telephone Encounter (Signed)
Pt called answering service over the weekend. He needed a albuterol inhaler refilled. He never got refill or call. Please send to [facility] [address]. Pt would also like a call concerning answering service. Pt can be reached at [phone].

## 2018-12-05 ENCOUNTER — Ambulatory Visit (INDEPENDENT_AMBULATORY_CARE_PROVIDER_SITE_OTHER): Payer: PPO | Admitting: Neurology

## 2018-12-05 ENCOUNTER — Encounter: Payer: Self-pay | Admitting: Neurology

## 2018-12-05 VITALS — BP 116/71 | HR 77 | Ht 72.0 in | Wt 228.0 lb

## 2018-12-05 DIAGNOSIS — E538 Deficiency of other specified B group vitamins: Secondary | ICD-10-CM

## 2018-12-05 DIAGNOSIS — M21371 Foot drop, right foot: Secondary | ICD-10-CM

## 2018-12-05 DIAGNOSIS — G5731 Lesion of lateral popliteal nerve, right lower limb: Secondary | ICD-10-CM | POA: Diagnosis not present

## 2018-12-05 HISTORY — DX: Lesion of lateral popliteal nerve, right lower limb: G57.31

## 2018-12-05 NOTE — Progress Notes (Signed)
Reason for visit: Foot drop, right   Jeremy Tucker is a 68 y.o. male  History of present illness:  Jeremy Tucker is a 68 year old right-handed white male with a history of diabetes that is diet controlled.  The patient noted spontaneous onset of a foot drop that occurred while walking in the park about 3 weeks ago.  The patient had painless onset, he has not had any back discomfort whatsoever or any discomfort down either leg.  He does have some mild neck pain and some discomfort down the left arm.  He reports some numbness in the toes on the right foot, he denies issues controlling the bowels or the bladder.  He has not had any weakness on the left leg or on the arms or on the face.  He does have a history of vertigo, this may occur off and on, he occasionally may have some double vision.  He is on anticoagulant therapy.  In December 2018, his hemoglobin A1c was 7.2.  The patient claims that he has been able to get his hemoglobin A1c down to 6.0.  He is on no medications for diabetes.  The patient now has an AFO brace on the right which has helped him walk a little bit better.  He returns to this office for an evaluation.  Past Medical History:  Diagnosis Date  . Aortic stenosis, mild   . Atrial fibrillation ([place])   . BPH (benign prostatic hyperplasia)   . Bronchitis    patient mentions " i am prone to bronchitis"   . Controlled diabetes mellitus type 2 with complications ([place])   . Depression   . DJD (degenerative joint disease)   . DVT (deep venous thrombosis) ([place])    post op knee surgery in 2001 , behind calf  ; was resolved with blood thinners   . Dysrhythmia    Atrial fibrillation  . Gastric polyp 08/02/2018   see report in chart  . GERD (gastroesophageal reflux disease)   . Glucose intolerance (impaired glucose tolerance)   . Gout    denies  . Hiatal hernia 08/02/2018   see report in chart  . Hyperlipidemia   . Hyperlipidemia associated with type 2 diabetes mellitus ([place])     . Hypertension    denies  . Hypertension associated with diabetes ([place])   . Kidney stones   . Obesity   . Persistent atrial fibrillation    a. CHADsVASc score at least 2  . Status post placement of implantable loop recorder 2 years ago ; 2016   left upper chest     Past Surgical History:  Procedure Laterality Date  . ATRIAL FIBRILLATION ABLATION N/A 03/12/2015   Procedure: ATRIAL FIBRILLATION ABLATION;  Surgeon: [name], MD;  Location: [facility] CATH LAB;  Service: Cardiovascular;  Laterality: N/A;  . CARDIAC CATHETERIZATION  1990's   "Dr. Melvern Banker", no blockages per pt  . CARDIOVERSION N/A 02/25/2015   Procedure: CARDIOVERSION;  Surgeon: Sueanne Margarita, MD;  Location: [place];  Service: Cardiovascular;  Laterality: N/A;  . COLONOSCOPY  2010   [facility]  . CYSTOSCOPY W/ STONE MANIPULATION  1980's   "couldn't get stone so they had to cut me open"  . CYSTOSCOPY WITH RETROGRADE PYELOGRAM, URETEROSCOPY AND STENT PLACEMENT Left 04/24/2015   Procedure: URETHRAL MEATAL DILATION, CYSTOSCOPY WITH LEFT RETROGRADE PYELOGRAM, LEFT URETEROSCOPY, STONE BASKET EXTRACTION,  LEFT DOUBLE J STENT PLACEMENT;  Surgeon: Carolan Clines, MD;  Location: [facility];  Service: Urology;  Laterality: Left;  .  EP IMPLANTABLE DEVICE N/A 09/15/2015   Procedure: Loop Recorder Insertion;  Surgeon: [name], MD;  Location: [facility] CV LAB;  Service: Cardiovascular;  Laterality: N/A;  . EYE SURGERY Bilateral 2016   cataract surgery with implant ; dr Gerald Stabs grote   . FOREARM FRACTURE SURGERY Right ~ 1961  . FRACTURE SURGERY    . HOLMIUM LASER APPLICATION Left 6/33/3545   Procedure: HOLMIUM LASER APPLICATION;  Surgeon: Carolan Clines, MD;  Location: [facility];  Service: Urology;  Laterality: Left;  . JOINT REPLACEMENT    . KIDNEY STONE SURGERY  1980's   "stone lodged in the duct; had to cut me open to get it"  . KNEE ARTHROSCOPY Bilateral "multiple times"  . LEFT HEART CATH AND CORONARY ANGIOGRAPHY N/A 09/19/2018    Procedure: LEFT HEART CATH AND CORONARY ANGIOGRAPHY;  Surgeon: Belva Crome, MD;  Location: [place] CV LAB;  Service: Cardiovascular;  Laterality: N/A;  . TEE WITHOUT CARDIOVERSION N/A 03/12/2015   Procedure: TRANSESOPHAGEAL ECHOCARDIOGRAM (TEE);  Surgeon: Pixie Casino, MD;  Location: [facility] ENDOSCOPY;  Service: Cardiovascular;  Laterality: N/A;  . TONSILLECTOMY    . TOTAL KNEE ARTHROPLASTY Left 2001   [facility]  . TOTAL KNEE ARTHROPLASTY Right 10/30/2017   Procedure: RIGHT TOTAL KNEE ARTHROPLASTY;  Surgeon: Paralee Cancel, MD;  Location: [facility];  Service: Orthopedics;  Laterality: Right;  90 mins    Family History  Problem Relation Age of Onset  . Heart attack Mother 38       MI  . Heart attack Father 83       ? MI versus trauma to head  . Healthy Brother     Social history:  reports that he has never smoked. He has never used smokeless tobacco. He reports current alcohol use. He reports that he does not use drugs.  Medications:  Prior to Admission medications   Medication Sig Start Date End Date Taking? Authorizing Provider  acetaminophen (TYLENOL) 500 MG tablet Take 1,000 mg by mouth every 8 (eight) hours as needed for mild pain.    Yes [provider]  albuterol (PROVENTIL HFA;VENTOLIN HFA) 108 (90 Base) MCG/ACT inhaler Inhale 2 puffs into the lungs every 6 (six) hours as needed for wheezing or shortness of breath. 12/03/18  Yes Denita Lung, MD  allopurinol (ZYLOPRIM) 300 MG tablet Take 300 mg by mouth daily.     Yes [provider]  ammonium lactate (LAC-HYDRIN) 12 % lotion Apply 1 application topically daily as needed for dry skin (3-4 TIMES WEEK).    Yes [provider]  atorvastatin (LIPITOR) 20 MG tablet Take 1 tablet (20 mg total) by mouth daily. Patient taking differently: Take 20 mg by mouth daily at 6 PM.  02/20/18  Yes Denita Lung, MD  buPROPion (WELLBUTRIN XL) 300 MG 24 hr tablet TAKE 1 TABLET(300 MG) BY MOUTH DAILY Patient taking  differently: Take 150 mg by mouth daily.  09/07/18  Yes Denita Lung, MD  busPIRone (BUSPAR) 10 MG tablet TAKE 1 TABLET(10 MG) BY MOUTH DAILY 06/25/18  Yes Denita Lung, MD  diazepam (VALIUM) 5 MG tablet Take 1 tablet (5 mg total) by mouth every 12 (twelve) hours as needed for anxiety. 11/22/18  Yes Denita Lung, MD  diltiazem (CARDIZEM CD) 180 MG 24 hr capsule Take 1 capsule (180 mg total) by mouth daily. Patient taking differently: Take 180 mg by mouth every evening.  05/01/17  Yes Sherran Needs, NP  ELIQUIS 5 MG TABS  tablet TAKE 1 TABLET BY MOUTH TWICE DAILY 12/03/18  Yes Allred, Jeneen Rinks, MD  HYDROmorphone (DILAUDID) 2 MG tablet Take 1-2 tablets (2-4 mg total) by mouth every 6 (six) hours as needed for severe pain (breakthrough pain). 10/30/17  Yes Babish, Rodman Key, PA-C  HYDROmorphone (DILAUDID) 4 MG tablet Take 4 mg by mouth every 4 (four) hours as needed for severe pain. Uses for kidney stones   Yes [provider]  loratadine (CLARITIN) 10 MG tablet Take 10 mg by mouth daily.   Yes [provider]  LORazepam (ATIVAN) 0.5 MG tablet TAKE 1 TABLET TWICE DAILY AS NEEDED FOR ANXIETY. Patient taking differently: Take 0.5 mg by mouth daily as needed for anxiety.  03/22/16  Yes Denita Lung, MD  metoprolol succinate (TOPROL-XL) 25 MG 24 hr tablet TAKE 1 TABLET BY MOUTH DAILY IN EVENING Patient taking differently: Take 25 mg by mouth at bedtime.  04/27/18  Yes Sherran Needs, NP  Multiple Vitamins-Minerals (MULTIVITAMIN WITH MINERALS) tablet Take 1 tablet by mouth daily.     Yes [provider]  olopatadine (PATANOL) 0.1 % ophthalmic solution Place 1 drop into both eyes daily as needed for allergies (tearing).    Yes [provider]  Omega-3 Fatty Acids (FISH OIL TRIPLE STRENGTH) 1400 MG CAPS Take 1,400 mg by mouth daily.    Yes [provider]  omeprazole (PRILOSEC) 20 MG capsule Take 20 mg by mouth daily.   Yes [provider]  polyethylene  glycol (MIRALAX / GLYCOLAX) packet Take 17 g by mouth 2 (two) times daily. Patient taking differently: Take 17 g by mouth daily as needed for mild constipation.  10/30/17  Yes Babish, Rodman Key, PA-C  sertraline (ZOLOFT) 50 MG tablet Take 1 tablet (50 mg total) by mouth daily. 10/26/18  Yes Denita Lung, MD  tamsulosin (FLOMAX) 0.4 MG CAPS capsule Take 0.4 mg by mouth daily.    Yes [provider]  triamcinolone cream (KENALOG) 0.1 % Apply 1 application topically 2 (two) times daily.    Yes [provider]  zolpidem (AMBIEN) 10 MG tablet Take 10 mg by mouth at bedtime as needed for sleep.   Yes [provider]      Allergies  Allergen Reactions  . Codeine Itching  . Oxycodone Itching    ROS:  Out of a complete 14 system review of symptoms, the patient complains only of the following symptoms, and all other reviewed systems are negative.  Weight loss Double vision Cough, wheezing  Blood pressure 116/71, pulse 77, height 6' (1.829 m), weight 228 lb (103.4 kg).  Physical Exam  General: The patient is alert and cooperative at the time of the examination.  The patient is moderately obese.  Eyes: Pupils are equal, round, and reactive to light. Discs are flat bilaterally.  Neck: The neck is supple, no carotid bruits are noted.  Respiratory: The respiratory examination is clear.  Cardiovascular: The cardiovascular examination reveals a regular rate and rhythm, no obvious murmurs or rubs are noted.  Skin: Extremities are without significant edema.  Neurologic Exam  Mental status: The patient is alert and oriented x 3 at the time of the examination. The patient has apparent normal recent and remote memory, with an apparently normal attention span and concentration ability.  Cranial nerves: Facial symmetry is present. There is good sensation of the face to pinprick and soft touch bilaterally. The strength of the facial muscles and the muscles to head  turning and shoulder shrug are  normal bilaterally. Speech is well enunciated, no aphasia or dysarthria is noted. Extraocular movements are full. Visual fields are full. The tongue is midline, and the patient has symmetric elevation of the soft palate. No obvious hearing deficits are noted.  Motor: The motor testing reveals 5 over 5 strength of all 4 extremities, with exception of a prominent foot drop on the right, weakness with extension of the toes on the right and weakness with eversion but not inversion of the right foot. Good symmetric motor tone is noted throughout.  Sensory: Sensory testing is intact to pinprick, soft touch, vibration sensation, and position sense on all 4 extremities, with exception of the slight decrease in pinprick sensation on the dorsum of the right foot and the distal lateral aspect of the lower leg on the right, slight decrease in vibration sensation on the right foot. No evidence of extinction is noted.  Coordination: Cerebellar testing reveals good finger-nose-finger and heel-to-shin bilaterally.  Gait and station: Gait is normal. Tandem gait is minimally unsteady. Romberg is negative. No drift is seen.  The patient can walk on the toes bilaterally.  Reflexes: Deep tendon reflexes are symmetric and normal bilaterally, with exception that there is a trace ankle jerk reflex on the right, decreased on the left. Toes are downgoing bilaterally.   Assessment/Plan:  1.  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  The patient has undergone MRI of the lumbar spine, this was reviewed online and does not show evidence of nerve root impingement.  The patient will be set up for blood work today.  It is likely that the foot drop is related to the diabetes.  The patient will undergo nerve conduction studies of both legs and EMG on the right leg.  The patient will otherwise follow-up in about 5  months.  The patient does have an AFO brace.  Jill Alexanders MD 12/05/2018 7:30 AM  [facility] [address] [place], [place] [address]  Phone [phone] Fax [phone]

## 2018-12-07 LAB — MULTIPLE MYELOMA PANEL, SERUM
Albumin SerPl Elph-Mcnc: 3.8 g/dL (ref 2.9–4.4)
Albumin/Glob SerPl: 1.4 (ref 0.7–1.7)
Alpha 1: 0.2 g/dL (ref 0.0–0.4)
Alpha2 Glob SerPl Elph-Mcnc: 1.1 g/dL — ABNORMAL HIGH (ref 0.4–1.0)
B-Globulin SerPl Elph-Mcnc: 0.9 g/dL (ref 0.7–1.3)
Gamma Glob SerPl Elph-Mcnc: 0.5 g/dL (ref 0.4–1.8)
Globulin, Total: 2.8 g/dL (ref 2.2–3.9)
IgA/Immunoglobulin A, Serum: 229 mg/dL (ref 61–437)
IgG (Immunoglobin G), Serum: 655 mg/dL — ABNORMAL LOW (ref 700–1600)
IgM (Immunoglobulin M), Srm: 96 mg/dL (ref 20–172)
Total Protein: 6.6 g/dL (ref 6.0–8.5)

## 2018-12-07 LAB — PAN-ANCA
ANCA Proteinase 3: <3.5 U/mL (ref 0.0–3.5)
Atypical pANCA: <1:20 {titer}
C-ANCA: <1:20 {titer}
Myeloperoxidase Ab: <9 U/mL (ref 0.0–9.0)
P-ANCA: <1:20 {titer}

## 2018-12-07 LAB — B. BURGDORFI ANTIBODIES: Lyme IgG/IgM Ab: <0.91 {ISR} (ref 0.00–0.90)

## 2018-12-07 LAB — ANA W/REFLEX: Anti Nuclear Antibody(ANA): NEGATIVE

## 2018-12-07 LAB — VITAMIN B12: Vitamin B-12: 768 pg/mL (ref 232–1245)

## 2018-12-07 LAB — SEDIMENTATION RATE: Sed Rate: 41 mm/hr — ABNORMAL HIGH (ref 0–30)

## 2018-12-07 LAB — ANGIOTENSIN CONVERTING ENZYME: Angio Convert Enzyme: 18 U/L (ref 14–82)

## 2018-12-10 ENCOUNTER — Telehealth: Payer: Self-pay | Admitting: Neurology

## 2018-12-10 NOTE — Telephone Encounter (Signed)
I contacted the pt and reviewed lab results from 12/09/18.   Notes recorded by Kathrynn Ducking, MD on 12/09/2018 at 4:17 PM EST Blood work is unremarkable with exception of a slight elevation in sedimentation rate which is a nonspecific measure of inflammation. This can be followed in the future. No etiology of neuropathy seen.  Pt verbalized understanding. Pt was advised the next step is to complete EMG nerve conduction test scheduled for 01/07/19. Pt was agreeable and had no further question/concerns.

## 2018-12-10 NOTE — Telephone Encounter (Signed)
Patient called the new pt referral line and lm on vm wanting to speak with Dr. Jannifer Franklin nurse about his blood test result that he received in the mail

## 2018-12-13 ENCOUNTER — Telehealth: Payer: Self-pay | Admitting: Family Medicine

## 2018-12-13 ENCOUNTER — Ambulatory Visit: Payer: PPO | Admitting: Psychology

## 2018-12-13 ENCOUNTER — Other Ambulatory Visit: Payer: Self-pay | Admitting: Family Medicine

## 2018-12-13 DIAGNOSIS — F320 Major depressive disorder, single episode, mild: Secondary | ICD-10-CM | POA: Diagnosis not present

## 2018-12-13 MED ORDER — OSELTAMIVIR PHOSPHATE 75 MG PO CAPS: 75.0000 mg | ORAL_CAPSULE | Freq: Every day | ORAL | 0 refills | Status: DC

## 2018-12-13 NOTE — Telephone Encounter (Signed)
Let him know that I called Tamiflu again and he is to take it daily for prevention while he is up there

## 2018-12-13 NOTE — Telephone Encounter (Signed)
Pt called and stated that he is going to [place] over the weekend to babysit. Grandchild was just diagnosed with flu. Pt is requesting a rx to take with them just in case he starts experiencing symptoms. Pt can be reached at [phone] or [phone].

## 2018-12-14 NOTE — Telephone Encounter (Signed)
Pt was advised [facility] 

## 2018-12-20 DIAGNOSIS — S72042A Displaced fracture of base of neck of left femur, initial encounter for closed fracture: Secondary | ICD-10-CM | POA: Diagnosis not present

## 2018-12-20 DIAGNOSIS — J45909 Unspecified asthma, uncomplicated: Secondary | ICD-10-CM | POA: Diagnosis not present

## 2018-12-20 DIAGNOSIS — Z9181 History of falling: Secondary | ICD-10-CM | POA: Diagnosis not present

## 2018-12-20 DIAGNOSIS — R079 Chest pain, unspecified: Secondary | ICD-10-CM | POA: Diagnosis not present

## 2018-12-20 DIAGNOSIS — R102 Pelvic and perineal pain: Secondary | ICD-10-CM | POA: Diagnosis not present

## 2018-12-20 DIAGNOSIS — Z0389 Encounter for observation for other suspected diseases and conditions ruled out: Secondary | ICD-10-CM | POA: Diagnosis not present

## 2018-12-20 DIAGNOSIS — S72001A Fracture of unspecified part of neck of right femur, initial encounter for closed fracture: Secondary | ICD-10-CM | POA: Diagnosis not present

## 2018-12-20 DIAGNOSIS — S72142D Displaced intertrochanteric fracture of left femur, subsequent encounter for closed fracture with routine healing: Secondary | ICD-10-CM | POA: Diagnosis not present

## 2018-12-20 DIAGNOSIS — M542 Cervicalgia: Secondary | ICD-10-CM | POA: Diagnosis not present

## 2018-12-20 DIAGNOSIS — Z7901 Long term (current) use of anticoagulants: Secondary | ICD-10-CM | POA: Diagnosis not present

## 2018-12-20 DIAGNOSIS — M21371 Foot drop, right foot: Secondary | ICD-10-CM | POA: Diagnosis not present

## 2018-12-20 DIAGNOSIS — Z86718 Personal history of other venous thrombosis and embolism: Secondary | ICD-10-CM | POA: Diagnosis not present

## 2018-12-20 DIAGNOSIS — F329 Major depressive disorder, single episode, unspecified: Secondary | ICD-10-CM | POA: Diagnosis not present

## 2018-12-20 DIAGNOSIS — F419 Anxiety disorder, unspecified: Secondary | ICD-10-CM | POA: Diagnosis not present

## 2018-12-20 DIAGNOSIS — S72009S Fracture of unspecified part of neck of unspecified femur, sequela: Secondary | ICD-10-CM | POA: Diagnosis not present

## 2018-12-20 DIAGNOSIS — S72002A Fracture of unspecified part of neck of left femur, initial encounter for closed fracture: Secondary | ICD-10-CM | POA: Diagnosis not present

## 2018-12-20 DIAGNOSIS — I4891 Unspecified atrial fibrillation: Secondary | ICD-10-CM | POA: Diagnosis not present

## 2018-12-20 DIAGNOSIS — N400 Enlarged prostate without lower urinary tract symptoms: Secondary | ICD-10-CM | POA: Diagnosis not present

## 2018-12-20 DIAGNOSIS — K219 Gastro-esophageal reflux disease without esophagitis: Secondary | ICD-10-CM | POA: Diagnosis not present

## 2018-12-20 DIAGNOSIS — N200 Calculus of kidney: Secondary | ICD-10-CM | POA: Diagnosis not present

## 2018-12-20 DIAGNOSIS — M25561 Pain in right knee: Secondary | ICD-10-CM | POA: Diagnosis not present

## 2018-12-20 DIAGNOSIS — S72142A Displaced intertrochanteric fracture of left femur, initial encounter for closed fracture: Secondary | ICD-10-CM | POA: Diagnosis not present

## 2018-12-20 DIAGNOSIS — I10 Essential (primary) hypertension: Secondary | ICD-10-CM | POA: Diagnosis not present

## 2018-12-20 DIAGNOSIS — S0990XA Unspecified injury of head, initial encounter: Secondary | ICD-10-CM | POA: Diagnosis not present

## 2018-12-20 DIAGNOSIS — M25551 Pain in right hip: Secondary | ICD-10-CM | POA: Diagnosis not present

## 2018-12-20 DIAGNOSIS — T148XXA Other injury of unspecified body region, initial encounter: Secondary | ICD-10-CM | POA: Diagnosis not present

## 2018-12-20 DIAGNOSIS — M109 Gout, unspecified: Secondary | ICD-10-CM | POA: Diagnosis not present

## 2018-12-27 ENCOUNTER — Ambulatory Visit: Payer: PPO | Admitting: Psychology

## 2018-12-28 ENCOUNTER — Telehealth: Payer: Self-pay

## 2018-12-28 NOTE — Telephone Encounter (Signed)
Left message for patient regarding disconnected monitor.  

## 2019-01-02 ENCOUNTER — Other Ambulatory Visit: Payer: Self-pay

## 2019-01-02 ENCOUNTER — Ambulatory Visit (INDEPENDENT_AMBULATORY_CARE_PROVIDER_SITE_OTHER): Payer: PPO

## 2019-01-02 DIAGNOSIS — I480 Paroxysmal atrial fibrillation: Secondary | ICD-10-CM | POA: Diagnosis not present

## 2019-01-02 NOTE — Patient Outreach (Signed)
[facility]) Care Management  01/02/2019  EMITT MAGLIONE [birth year] [id number]   Patient discharged from [facility] on 12/26/2018.  Referral received. No outreach warranted at this time. Transition of Care  will be completed by primary care provider office who will refer to [facility] care management if needed.  Plan: RN CM will close case.  Jone Baseman, RN, MSN [place] [facility] Care Management Coordinator Direct Line [phone] Cell [phone] Toll Free: [phone]  Fax: [phone]

## 2019-01-03 ENCOUNTER — Telehealth: Payer: Self-pay | Admitting: Family Medicine

## 2019-01-03 LAB — CUP PACEART REMOTE DEVICE CHECK
Date Time Interrogation Session: 20200219131101
Implantable Pulse Generator Implant Date: 20161101

## 2019-01-03 NOTE — Telephone Encounter (Signed)
Pt called and stated that while he was in [place] he fell and broke his leg. He wanted to make you aware. Pt can be reached at [phone]. He will following up with Dr. Alvan Dame here in [place].

## 2019-01-04 ENCOUNTER — Ambulatory Visit (INDEPENDENT_AMBULATORY_CARE_PROVIDER_SITE_OTHER): Payer: PPO | Admitting: Family Medicine

## 2019-01-04 ENCOUNTER — Encounter: Payer: Self-pay | Admitting: Family Medicine

## 2019-01-04 VITALS — BP 110/78 | HR 73 | Temp 97.9°F | Wt 226.6 lb

## 2019-01-04 DIAGNOSIS — F419 Anxiety disorder, unspecified: Secondary | ICD-10-CM

## 2019-01-04 DIAGNOSIS — F329 Major depressive disorder, single episode, unspecified: Secondary | ICD-10-CM | POA: Diagnosis not present

## 2019-01-04 DIAGNOSIS — M25552 Pain in left hip: Secondary | ICD-10-CM | POA: Diagnosis not present

## 2019-01-04 DIAGNOSIS — F32A Depression, unspecified: Secondary | ICD-10-CM

## 2019-01-04 DIAGNOSIS — Z8781 Personal history of (healed) traumatic fracture: Secondary | ICD-10-CM | POA: Diagnosis not present

## 2019-01-04 DIAGNOSIS — S72145A Nondisplaced intertrochanteric fracture of left femur, initial encounter for closed fracture: Secondary | ICD-10-CM | POA: Diagnosis not present

## 2019-01-04 MED ORDER — ZOLPIDEM TARTRATE 10 MG PO TABS: 10.0000 mg | ORAL_TABLET | Freq: Every evening | ORAL | 0 refills | Status: DC | PRN

## 2019-01-04 NOTE — Progress Notes (Signed)
   Subjective:    Patient ID: Jeremy Tucker, male    DOB: [birth year], 68 y.o.   MRN: [id number]  HPI He is here for follow-up concerning his underlying anxiety and depression. Presently he is mainly taking Wellbutrin and seems to be doing quite nicely on that.  He states that he is at least 90% better.  He is involved in counseling.  Apparently the counselor is mainly going to be talking to him initially and then with his wife.  Review of Systems     Objective:   Physical Exam Alert and in no distress but complaining of left hip pain.  He is using a walker.       Assessment & Plan:  Anxiety and depression  S/p left hip fracture Since he is doing well on Wellbutrin he will continue on this and I will see him in 2 months.  Discussed better communication skills with him and encouraged him to continue in counseling which she will definitely do.  I get the impression that some of the issues have been longstanding but unaddressed for decades.  This is especially true with some a comments his wife has made.

## 2019-01-04 NOTE — Telephone Encounter (Signed)
Patient called back and stated he needs a refill on his Ambien to be sent to [facility] on [address]

## 2019-01-07 ENCOUNTER — Ambulatory Visit (INDEPENDENT_AMBULATORY_CARE_PROVIDER_SITE_OTHER): Payer: PPO | Admitting: Neurology

## 2019-01-07 ENCOUNTER — Encounter: Payer: Self-pay | Admitting: Neurology

## 2019-01-07 DIAGNOSIS — M21371 Foot drop, right foot: Secondary | ICD-10-CM

## 2019-01-07 DIAGNOSIS — E1142 Type 2 diabetes mellitus with diabetic polyneuropathy: Secondary | ICD-10-CM

## 2019-01-07 DIAGNOSIS — G5601 Carpal tunnel syndrome, right upper limb: Secondary | ICD-10-CM | POA: Insufficient documentation

## 2019-01-07 HISTORY — DX: Carpal tunnel syndrome, right upper limb: G56.01

## 2019-01-07 HISTORY — DX: Type 2 diabetes mellitus with diabetic polyneuropathy: E11.42

## 2019-01-07 NOTE — Procedures (Signed)
     HISTORY:  Jeremy Tucker is a 69 year old gentleman with a history of diabetes who has developed a right foot drop.  The patient reports no numbness of the feet per se, he denies any back pain or pain down the leg.  He is being evaluated for possible peripheral neuropathy.  NERVE CONDUCTION STUDIES:  Nerve conduction studies were performed on the right upper extremity.  The distal motor latency for the right median nerve was prolonged with a normal motor amplitude.  Distal motor latency motor amplitudes for the right ulnar nerve were normal.  The nerve conduction velocities for the right median and ulnar nerves were normal.  The sensory latency for the right median nerve was prolonged, normal for the right ulnar nerve.  The right ulnar F-wave latency was normal.   Nerve conduction studies were performed on both lower extremities.  The distal motor latencies and motor amplitudes for the peroneal and posterior tibial nerves were normal bilaterally with slowing seen above and below the knee for the peroneal nerves bilaterally, borderline normal for the posterior tibial nerves bilaterally.  The sensory latencies for the sural and peroneal nerves were unobtainable bilaterally.  The F-wave latencies for the posterior tibial nerves were prolonged bilaterally.  EMG STUDIES:  EMG study was performed on the right lower extremity:  The tibialis anterior muscle reveals 1 to 3K motor units with markedly reduced recruitment. 3+ fibrillations and positive waves were seen. The peroneus tertius muscle reveals 1 to 2K motor units with markedly reduced recruitment. 3+ fibrillations and positive waves were seen. The medial gastrocnemius muscle reveals 1 to 3K motor units with full recruitment. No fibrillations or positive waves were seen. The vastus lateralis muscle reveals 2 to 4K motor units with full recruitment. No fibrillations or positive waves were seen. The iliopsoas muscle reveals 2 to 4K motor units  with full recruitment. No fibrillations or positive waves were seen. The biceps femoris muscle (long head) reveals 2 to 4K motor units with full recruitment. No fibrillations or positive waves were seen. The lumbosacral paraspinal muscles were tested at 3 levels, and revealed no abnormalities of insertional activity at all 3 levels tested. There was good relaxation.   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  There is evidence of a mild right carpal tunnel syndrome and involvement of the peroneal nerves bilaterally at the fibular head.  EMG evaluation of the right lower extremity shows denervation consistent with a peroneal neuropathy at the fibular head, no evidence of an overlying lumbosacral radiculopathy was seen.  Jill Alexanders MD 01/07/2019 9:36 AM  [facility] [address] [place], [place] [address]  Phone [phone] Fax [phone]

## 2019-01-07 NOTE — Progress Notes (Addendum)
The patient returns for EMG nerve conduction study evaluation.  Nerve conduction studies show evidence of a peripheral neuropathy, likely secondary to diabetes.  EMG evaluation of the right lower extremity shows findings consistent with a severe right peroneal neuropathy at the fibular head.  The patient has some dysfunction of the left peroneal nerve as well.  The patient apparently has fallen and fractured his left hip.  He is recovering from this.     [place]    Nerve / Sites Muscle Latency Ref. Amplitude Ref. Rel Amp Segments Distance Velocity Ref. Area    ms ms mV mV %  cm m/s m/s mVms  R Median - APB     Wrist APB 4.2 ?4.4 6.8 ?4.0 100 Wrist - APB 7   19.5     Upper arm APB 8.9  6.1  89.7 Upper arm - Wrist 23 49 ?49 18.2  R Ulnar - ADM     Wrist ADM 2.5 ?3.3 8.4 ?6.0 100 Wrist - ADM 7   28.0     B.Elbow ADM 7.2  8.0  95.1 B.Elbow - Wrist 23 49 ?49 28.3     A.Elbow ADM 9.3  7.9  98.9 A.Elbow - B.Elbow 10 49 ?49 28.2         A.Elbow - Wrist      R Peroneal - EDB     Ankle EDB 5.2 ?6.5 3.1 ?2.0 100 Ankle - EDB 9   16.4     Fib head EDB 14.2  2.8  89.8 Fib head - Ankle 32 36 ?44 16.8     Pop fossa EDB 18.6  0.5  18 Pop fossa - Fib head 10 23 ?44 2.1         Pop fossa - Ankle      L Peroneal - EDB     Ankle EDB 4.9 ?6.5 4.2 ?2.0 100 Ankle - EDB 9   9.8     Fib head EDB 13.2  3.3  78.3 Fib head - Ankle 32 38 ?44 9.1     Pop fossa EDB 15.9  2.9  89.1 Pop fossa - Fib head 10 37 ?44 8.4         Pop fossa - Ankle      R Tibial - AH     Ankle AH 3.9 ?5.8 12.8 ?4.0 100 Ankle - AH 9   36.0     Pop fossa AH 13.6  8.3  65.2 Pop fossa - Ankle 40 41 ?41 30.3  L Tibial - AH     Ankle AH 4.8 ?5.8 10.2 ?4.0 100 Ankle - AH 9   23.0     Pop fossa AH 14.5  7.3  71.4 Pop fossa - Ankle 40 41 ?41 20.7                 SNC    Nerve / Sites Rec. Site Peak Lat Ref.  Amp Ref. Segments Distance    ms ms V V  cm  R Sural - Ankle (Calf)     Calf Ankle NR ?4.4 NR ?6 Calf - Ankle 14  L Sural - Ankle (Calf)      Calf Ankle NR ?4.4 NR ?6 Calf - Ankle 14  R Superficial peroneal - Ankle     Lat leg Ankle NR ?4.4 NR ?6 Lat leg - Ankle 14  L Superficial peroneal - Ankle     Lat leg Ankle NR ?4.4 NR ?6 Lat leg - Ankle 14  R Median - Orthodromic (Dig II, Mid palm)     Dig II Wrist 3.7 ?3.4 10 ?10 Dig II - Wrist 13  R Ulnar - Orthodromic, (Dig V, Mid palm)     Dig V Wrist 2.7 ?3.1 7 ?5 Dig V - Wrist 31                  F  Wave    Nerve F Lat Ref.   ms ms  R Tibial - AH 65.3 ?56.0  L Tibial - AH 62.3 ?56.0  R Ulnar - ADM 31.9 ?32.0

## 2019-01-07 NOTE — Progress Notes (Signed)
Please refer to EMG and nerve conduction procedure note.  

## 2019-01-10 ENCOUNTER — Ambulatory Visit (INDEPENDENT_AMBULATORY_CARE_PROVIDER_SITE_OTHER): Payer: PPO | Admitting: Psychology

## 2019-01-10 DIAGNOSIS — F320 Major depressive disorder, single episode, mild: Secondary | ICD-10-CM

## 2019-01-10 NOTE — Progress Notes (Signed)
Carelink Summary Report / Loop Recorder 

## 2019-01-25 DIAGNOSIS — Z9889 Other specified postprocedural states: Secondary | ICD-10-CM | POA: Diagnosis not present

## 2019-01-25 DIAGNOSIS — Z961 Presence of intraocular lens: Secondary | ICD-10-CM | POA: Diagnosis not present

## 2019-01-25 DIAGNOSIS — J301 Allergic rhinitis due to pollen: Secondary | ICD-10-CM | POA: Diagnosis not present

## 2019-01-25 DIAGNOSIS — H43813 Vitreous degeneration, bilateral: Secondary | ICD-10-CM | POA: Diagnosis not present

## 2019-01-26 ENCOUNTER — Other Ambulatory Visit: Payer: Self-pay | Admitting: Family Medicine

## 2019-01-26 DIAGNOSIS — F419 Anxiety disorder, unspecified: Secondary | ICD-10-CM

## 2019-01-26 DIAGNOSIS — F329 Major depressive disorder, single episode, unspecified: Secondary | ICD-10-CM

## 2019-01-26 DIAGNOSIS — F32A Depression, unspecified: Secondary | ICD-10-CM

## 2019-01-28 NOTE — Telephone Encounter (Signed)
[facility] Is requesting to fill pt zoloft. Please advise [facility]

## 2019-01-29 DIAGNOSIS — M898X5 Other specified disorders of bone, thigh: Secondary | ICD-10-CM | POA: Insufficient documentation

## 2019-02-04 ENCOUNTER — Other Ambulatory Visit: Payer: Self-pay

## 2019-02-04 ENCOUNTER — Encounter: Payer: PPO | Admitting: *Deleted

## 2019-02-13 NOTE — Progress Notes (Signed)
No show letter

## 2019-03-12 ENCOUNTER — Other Ambulatory Visit: Payer: Self-pay | Admitting: Nurse Practitioner

## 2019-04-17 ENCOUNTER — Other Ambulatory Visit: Payer: Self-pay | Admitting: Family Medicine

## 2019-04-17 DIAGNOSIS — F32A Depression, unspecified: Secondary | ICD-10-CM

## 2019-04-17 DIAGNOSIS — F419 Anxiety disorder, unspecified: Secondary | ICD-10-CM

## 2019-04-17 DIAGNOSIS — F329 Major depressive disorder, single episode, unspecified: Secondary | ICD-10-CM

## 2019-04-17 NOTE — Telephone Encounter (Signed)
[facility] is requesting to fill pt zoloft. Please advise [facility]

## 2019-04-20 ENCOUNTER — Other Ambulatory Visit: Payer: Self-pay | Admitting: Family Medicine

## 2019-05-06 ENCOUNTER — Telehealth: Payer: Self-pay | Admitting: Neurology

## 2019-05-06 NOTE — Telephone Encounter (Signed)
Due to current COVID 19 pandemic, our office is severely reducing in office visits until further notice, in order to minimize the risk to our patients and healthcare providers.   I called patient and LVM to discuss a virtual visit via mychart for 6/24 appt. Requested pt call back. Office contact info provided.

## 2019-05-06 NOTE — Telephone Encounter (Signed)
Pt has called back and RN Jinny Blossom confirmed it would be okay for a in office visit.  Pt was asked Covid-19 questions and his response to all was no. Pt will check in at 8:00 on Wed 06-24

## 2019-05-06 NOTE — Telephone Encounter (Signed)
Noted  

## 2019-05-08 ENCOUNTER — Other Ambulatory Visit: Payer: Self-pay

## 2019-05-08 ENCOUNTER — Ambulatory Visit: Payer: PPO | Admitting: Neurology

## 2019-05-08 ENCOUNTER — Encounter: Payer: Self-pay | Admitting: Neurology

## 2019-05-08 VITALS — BP 138/78 | HR 67 | Temp 95.9°F | Ht 72.0 in | Wt 240.0 lb

## 2019-05-08 DIAGNOSIS — G5601 Carpal tunnel syndrome, right upper limb: Secondary | ICD-10-CM | POA: Diagnosis not present

## 2019-05-08 DIAGNOSIS — G5731 Lesion of lateral popliteal nerve, right lower limb: Secondary | ICD-10-CM | POA: Diagnosis not present

## 2019-05-08 DIAGNOSIS — E1142 Type 2 diabetes mellitus with diabetic polyneuropathy: Secondary | ICD-10-CM

## 2019-05-08 NOTE — Progress Notes (Signed)
Reason for visit: Right foot drop  Jeremy Tucker is an 68 y.o. male  History of present illness:  Jeremy Tucker is a 68 year old right-handed white male with a history of diabetes and a diabetic peripheral neuropathy.  The patient has had nerve conduction studies done previously that showed evidence of a right peroneal neuropathy.  The patient does have a tendency to cross his legs right over left.  He comes in today indicating that he is not getting any better with his foot drop, he has not had any further falls.  He has had ongoing bilateral knee pain, he has had bilateral total knee replacements, he fell previously fracturing his left hip and he may have injured his knees as well.  He has not yet been back to the orthopedic surgeon.  He comes in today for an evaluation.  Past Medical History:  Diagnosis Date  . Aortic stenosis, mild   . Atrial fibrillation ([place])   . BPH (benign prostatic hyperplasia)   . Bronchitis    patient mentions " i am prone to bronchitis"   . Controlled diabetes mellitus type 2 with complications ([place])   . Depression   . Diabetic peripheral neuropathy ([place]) 01/07/2019  . DJD (degenerative joint disease)   . DVT (deep venous thrombosis) ([place])    post op knee surgery in 2001 , behind calf  ; was resolved with blood thinners   . Dysrhythmia    Atrial fibrillation  . Gastric polyp 08/02/2018   see report in chart  . GERD (gastroesophageal reflux disease)   . Glucose intolerance (impaired glucose tolerance)   . Gout    denies  . Hiatal hernia 08/02/2018   see report in chart  . Hyperlipidemia   . Hyperlipidemia associated with type 2 diabetes mellitus ([place])   . Hypertension    denies  . Hypertension associated with diabetes ([place])   . Kidney stones   . Obesity   . 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 Persistent atrial fibrillation    a. CHADsVASc score at least 2  . Right carpal tunnel syndrome 01/07/2019  . Status post placement of  implantable loop recorder 2 years ago ; 2016   left upper chest     Past Surgical History:  Procedure Laterality Date  . ATRIAL FIBRILLATION ABLATION N/A 03/12/2015   Procedure: ATRIAL FIBRILLATION ABLATION;  Surgeon: [name], MD;  Location: [facility] CATH LAB;  Service: Cardiovascular;  Laterality: N/A;  . CARDIAC CATHETERIZATION  1990's   "Dr. Melvern Banker", no blockages per pt  . CARDIOVERSION N/A 02/25/2015   Procedure: CARDIOVERSION;  Surgeon: Sueanne Margarita, MD;  Location: [place];  Service: Cardiovascular;  Laterality: N/A;  . COLONOSCOPY  2010   [facility]  . CYSTOSCOPY W/ STONE MANIPULATION  1980's   "couldn't get stone so they had to cut me open"  . CYSTOSCOPY WITH RETROGRADE PYELOGRAM, URETEROSCOPY AND STENT PLACEMENT Left 04/24/2015   Procedure: URETHRAL MEATAL DILATION, CYSTOSCOPY WITH LEFT RETROGRADE PYELOGRAM, LEFT URETEROSCOPY, STONE BASKET EXTRACTION,  LEFT DOUBLE J STENT PLACEMENT;  Surgeon: Carolan Clines, MD;  Location: [facility];  Service: Urology;  Laterality: Left;  . EP IMPLANTABLE DEVICE N/A 09/15/2015   Procedure: Loop Recorder Insertion;  Surgeon: [name], MD;  Location: [facility] CV LAB;  Service: Cardiovascular;  Laterality: N/A;  . EYE SURGERY Bilateral 2016   cataract surgery with implant ; dr Gerald Stabs grote   . FOREARM FRACTURE SURGERY Right ~ 1961  . FRACTURE SURGERY    .  HOLMIUM LASER APPLICATION Left 01/12/6009   Procedure: HOLMIUM LASER APPLICATION;  Surgeon: Carolan Clines, MD;  Location: [facility];  Service: Urology;  Laterality: Left;  . JOINT REPLACEMENT    . KIDNEY STONE SURGERY  1980's   "stone lodged in the duct; had to cut me open to get it"  . KNEE ARTHROSCOPY Bilateral "multiple times"  . LEFT HEART CATH AND CORONARY ANGIOGRAPHY N/A 09/19/2018   Procedure: LEFT HEART CATH AND CORONARY ANGIOGRAPHY;  Surgeon: Belva Crome, MD;  Location: [facility] CV LAB;  Service: Cardiovascular;  Laterality: N/A;  . TEE WITHOUT CARDIOVERSION N/A 03/12/2015    Procedure: TRANSESOPHAGEAL ECHOCARDIOGRAM (TEE);  Surgeon: Pixie Casino, MD;  Location: [facility] ENDOSCOPY;  Service: Cardiovascular;  Laterality: N/A;  . TONSILLECTOMY    . TOTAL KNEE ARTHROPLASTY Left 2001   [facility]  . TOTAL KNEE ARTHROPLASTY Right 10/30/2017   Procedure: RIGHT TOTAL KNEE ARTHROPLASTY;  Surgeon: Paralee Cancel, MD;  Location: [facility];  Service: Orthopedics;  Laterality: Right;  90 mins    Family History  Problem Relation Age of Onset  . Heart attack Mother 37       MI  . Heart attack Father 23       ? MI versus trauma to head  . Healthy Brother     Social history:  reports that he has never smoked. He has never used smokeless tobacco. He reports current alcohol use. He reports that he does not use drugs.    Allergies  Allergen Reactions  . Codeine Itching  . Oxycodone Itching    Medications:  Prior to Admission medications   Medication Sig Start Date End Date Taking? Authorizing Provider  acetaminophen (TYLENOL) 500 MG tablet Take 1,000 mg by mouth every 8 (eight) hours as needed for mild pain.    Yes [provider]  albuterol (PROVENTIL HFA;VENTOLIN HFA) 108 (90 Base) MCG/ACT inhaler Inhale 2 puffs into the lungs every 6 (six) hours as needed for wheezing or shortness of breath. 12/03/18  Yes Denita Lung, MD  allopurinol (ZYLOPRIM) 300 MG tablet Take 300 mg by mouth daily.     Yes [provider]  ammonium lactate (LAC-HYDRIN) 12 % lotion Apply 1 application topically daily as needed for dry skin (3-4 TIMES WEEK).    Yes [provider]  atorvastatin (LIPITOR) 20 MG tablet TAKE 1 TABLET(20 MG) BY MOUTH DAILY 04/22/19  Yes Denita Lung, MD  buPROPion (WELLBUTRIN XL) 300 MG 24 hr tablet TAKE 1 TABLET(300 MG) BY MOUTH DAILY Patient taking differently: Take 150 mg by mouth daily.  09/07/18  Yes Denita Lung, MD  diltiazem (CARDIZEM CD) 180 MG 24 hr capsule TAKE 1 CAPSULE BY MOUTH TWICE DAILY Patient taking differently: Take 180 mg by  mouth daily. 1 daily 03/12/19  Yes Belva Crome, MD  ELIQUIS 5 MG TABS tablet TAKE 1 TABLET BY MOUTH TWICE DAILY 12/03/18  Yes Allred, Jeneen Rinks, MD  HYDROmorphone (DILAUDID) 2 MG tablet Take 1-2 tablets (2-4 mg total) by mouth every 6 (six) hours as needed for severe pain (breakthrough pain). 10/30/17  Yes Babish, Rodman Key, PA-C  HYDROmorphone (DILAUDID) 4 MG tablet Take 4 mg by mouth every 4 (four) hours as needed for severe pain. Uses for kidney stones   Yes [provider]  loratadine (CLARITIN) 10 MG tablet Take 10 mg by mouth daily.   Yes [provider]  LORazepam (ATIVAN) 0.5 MG tablet TAKE 1 TABLET TWICE DAILY AS NEEDED FOR ANXIETY. Patient taking  differently: Take 0.5 mg by mouth daily as needed for anxiety.  03/22/16  Yes Denita Lung, MD  metoprolol succinate (TOPROL-XL) 25 MG 24 hr tablet TAKE 1 TABLET BY MOUTH DAILY IN EVENING Patient taking differently: Take 25 mg by mouth at bedtime.  04/27/18  Yes Sherran Needs, NP  Multiple Vitamins-Minerals (MULTIVITAMIN WITH MINERALS) tablet Take 1 tablet by mouth daily.     Yes [provider]  olopatadine (PATANOL) 0.1 % ophthalmic solution Place 1 drop into both eyes daily as needed for allergies (tearing).    Yes [provider]  Omega-3 Fatty Acids (FISH OIL TRIPLE STRENGTH) 1400 MG CAPS Take 1,400 mg by mouth daily.    Yes [provider]  omeprazole (PRILOSEC) 20 MG capsule Take 20 mg by mouth daily.   Yes [provider]  polyethylene glycol (MIRALAX / GLYCOLAX) packet Take 17 g by mouth 2 (two) times daily. 10/30/17  Yes Babish, Rodman Key, PA-C  sertraline (ZOLOFT) 50 MG tablet TAKE 1 TABLET(50 MG) BY MOUTH DAILY 04/17/19  Yes Denita Lung, MD  tamsulosin (FLOMAX) 0.4 MG CAPS capsule Take 0.4 mg by mouth daily.    Yes [provider]  triamcinolone cream (KENALOG) 0.1 % Apply 1 application topically 2 (two) times daily.    Yes [provider]  zolpidem (AMBIEN) 10 MG  tablet Take 1 tablet (10 mg total) by mouth at bedtime as needed for sleep. Patient not taking: Reported on 05/08/2019 01/04/19   Denita Lung, MD    ROS:  Out of a complete 14 system review of symptoms, the patient complains only of the following symptoms, and all other reviewed systems are negative.  Knee pain Right foot drop  Blood pressure 138/78, pulse 67, temperature (!) 95.9 F (35.5 C), temperature source Oral, height 6' (1.829 m), weight 240 lb (108.9 kg).  Physical Exam  General: The patient is alert and cooperative at the time of the examination.  The patient is moderately to markedly obese.  Skin: No significant peripheral edema is noted.   Neurologic Exam  Mental status: The patient is alert and oriented x 3 at the time of the examination. The patient has apparent normal recent and remote memory, with an apparently normal attention span and concentration ability.   Cranial nerves: Facial symmetry is present. Speech is normal, no aphasia or dysarthria is noted. Extraocular movements are full. Visual fields are full.  Motor: The patient has good strength in all 4 extremities, with exception of a mild right foot drop.  Sensory examination: Soft touch sensation is symmetric on the face, arms, and legs.  Coordination: The patient has good finger-nose-finger and heel-to-shin bilaterally.  Gait and station: The patient has a normal gait. Tandem gait is unsteady. Romberg is negative. No drift is seen.  The patient is able to walk on the toes bilaterally, he is not able to walk on the heel on the right, can perform on the left.  Reflexes: Deep tendon reflexes are symmetric, but are depressed.   Assessment/Plan:  1.  Diabetic peripheral neuropathy  2.  Mild right carpal tunnel syndrome  3.  Right peroneal neuropathy, improving  The patient has significant improvement of strength with the right foot drop.  The patient will need to continue to restrain himself when it  comes to crossing his legs, right over left.  He is still doing this on a regular basis.  At this point, the patient will follow-up through this office if needed.  Jill Alexanders MD 05/08/2019 8:27 AM  [facility] [address] [place], [place] [address]  Phone [phone] Fax [phone]

## 2019-05-20 DIAGNOSIS — R351 Nocturia: Secondary | ICD-10-CM | POA: Diagnosis not present

## 2019-05-20 DIAGNOSIS — N401 Enlarged prostate with lower urinary tract symptoms: Secondary | ICD-10-CM | POA: Diagnosis not present

## 2019-05-20 DIAGNOSIS — N200 Calculus of kidney: Secondary | ICD-10-CM | POA: Diagnosis not present

## 2019-05-20 DIAGNOSIS — N5201 Erectile dysfunction due to arterial insufficiency: Secondary | ICD-10-CM | POA: Diagnosis not present

## 2019-06-11 ENCOUNTER — Other Ambulatory Visit: Payer: Self-pay | Admitting: Internal Medicine

## 2019-06-11 ENCOUNTER — Telehealth: Payer: Self-pay

## 2019-06-11 ENCOUNTER — Other Ambulatory Visit (HOSPITAL_COMMUNITY): Payer: Self-pay | Admitting: Nurse Practitioner

## 2019-06-11 ENCOUNTER — Other Ambulatory Visit: Payer: Self-pay

## 2019-06-11 DIAGNOSIS — Z20822 Contact with and (suspected) exposure to covid-19: Secondary | ICD-10-CM

## 2019-06-11 DIAGNOSIS — Z20828 Contact with and (suspected) exposure to other viral communicable diseases: Secondary | ICD-10-CM

## 2019-06-11 DIAGNOSIS — R6889 Other general symptoms and signs: Secondary | ICD-10-CM | POA: Diagnosis not present

## 2019-06-11 NOTE — Telephone Encounter (Signed)
Orders are being put in. 

## 2019-06-11 NOTE — Telephone Encounter (Signed)
Thank you :)

## 2019-06-11 NOTE — Addendum Note (Signed)
Addended by: Edgar Frisk on: 06/11/2019 08:45 AM   Modules accepted: Orders

## 2019-06-11 NOTE — Telephone Encounter (Signed)
Pt called and stated his wife recently went to her hair dresser and now hair dresser has covid. Pt and his wife would like to be tested due to possible exposure. Is it ok to put these orders in?

## 2019-06-11 NOTE — Telephone Encounter (Signed)
Eliquis 5mg  refill request received; pt is 68 yrs old, wt-108.9kg, Crea-0.89 on 09/14/2018, last seen by Dr. Tamala Julian on 09/10/2018, Diagnosis Afib; will send in refill.

## 2019-06-13 LAB — NOVEL CORONAVIRUS, NAA: SARS-CoV-2, NAA: NOT DETECTED

## 2019-06-20 ENCOUNTER — Ambulatory Visit: Payer: PPO | Admitting: Family Medicine

## 2019-07-17 ENCOUNTER — Encounter: Payer: Self-pay | Admitting: Family Medicine

## 2019-07-24 ENCOUNTER — Encounter: Payer: Self-pay | Admitting: Family Medicine

## 2019-07-24 ENCOUNTER — Ambulatory Visit (INDEPENDENT_AMBULATORY_CARE_PROVIDER_SITE_OTHER): Payer: PPO | Admitting: Family Medicine

## 2019-07-24 ENCOUNTER — Other Ambulatory Visit: Payer: Self-pay

## 2019-07-24 VITALS — BP 127/75 | HR 71 | Wt 240.0 lb

## 2019-07-24 DIAGNOSIS — G5731 Lesion of lateral popliteal nerve, right lower limb: Secondary | ICD-10-CM | POA: Diagnosis not present

## 2019-07-24 DIAGNOSIS — F329 Major depressive disorder, single episode, unspecified: Secondary | ICD-10-CM | POA: Diagnosis not present

## 2019-07-24 DIAGNOSIS — F419 Anxiety disorder, unspecified: Secondary | ICD-10-CM

## 2019-07-24 DIAGNOSIS — F32A Depression, unspecified: Secondary | ICD-10-CM

## 2019-07-24 NOTE — Progress Notes (Signed)
   Subjective:    Patient ID: Jeremy Tucker, male    DOB: [birth year], 68 y.o.   MRN: [id number]  HPI Documentation for virtual telephone encounter.  Documentation for virtual audio and video telecommunications through [place] encounter: The patient was located at home. The provider was located in the office. The patient did consent to this visit and is aware of possible charges through their insurance for this visit. The other persons participating in this telemedicine service were none. Time spent on call was 10  minutes total. This virtual service is not related to other E/M service within previous 7 days. This is a medication check.  He states that he is taking Zoloft and finds that this is quite useful making him much more relaxed.  He does have a previous history of a peroneal neuropathy and is now walking and having no difficulty with that.  Review of record indicates he has not had follow-up concerning his diabetes recently.    Review of Systems     Objective:   Physical Exam Alert and in no distress otherwise not examined      Assessment & Plan:  Anxiety and depression  Peroneal neuropathy at knee, right He will continue on his present medication.  Did recommend he come in for a diabetes follow-up visit as we do need blood work as well as check his feet etc.  He will set up an appointment for that.

## 2019-07-26 ENCOUNTER — Other Ambulatory Visit: Payer: Self-pay | Admitting: Family Medicine

## 2019-07-26 DIAGNOSIS — F419 Anxiety disorder, unspecified: Secondary | ICD-10-CM

## 2019-07-26 DIAGNOSIS — F32A Depression, unspecified: Secondary | ICD-10-CM

## 2019-07-26 DIAGNOSIS — F329 Major depressive disorder, single episode, unspecified: Secondary | ICD-10-CM

## 2019-07-26 NOTE — Telephone Encounter (Signed)
Pt called and needs the Zoloft refilled at the [facility] on [address] [place]

## 2019-07-29 ENCOUNTER — Ambulatory Visit: Payer: PPO | Admitting: Family Medicine

## 2019-08-08 DIAGNOSIS — S72142D Displaced intertrochanteric fracture of left femur, subsequent encounter for closed fracture with routine healing: Secondary | ICD-10-CM | POA: Diagnosis not present

## 2019-08-08 DIAGNOSIS — M25562 Pain in left knee: Secondary | ICD-10-CM | POA: Diagnosis not present

## 2019-08-08 DIAGNOSIS — M25561 Pain in right knee: Secondary | ICD-10-CM | POA: Diagnosis not present

## 2019-08-08 DIAGNOSIS — Z96651 Presence of right artificial knee joint: Secondary | ICD-10-CM | POA: Diagnosis not present

## 2019-08-08 DIAGNOSIS — M79659 Pain in unspecified thigh: Secondary | ICD-10-CM | POA: Diagnosis not present

## 2019-08-08 DIAGNOSIS — M25552 Pain in left hip: Secondary | ICD-10-CM | POA: Diagnosis not present

## 2019-08-18 ENCOUNTER — Other Ambulatory Visit: Payer: Self-pay | Admitting: Family Medicine

## 2019-08-19 NOTE — Telephone Encounter (Signed)
[facility] is requesting to fill pt welbutrin. Please advise [facility]

## 2019-09-02 IMAGING — CT CT ANGIO CHEST
2 of 7 series · 17 of 46 positions shown · IV contrast (iopamidol)
Comparison: Chest radiograph September 14, 2018

Addendum:
CLINICAL DATA: Chest pain.  History of aortic stenosis

EXAM:
CT ANGIOGRAPHY CHEST WITH CONTRAST
TECHNIQUE: Multidetector CT imaging of the chest was performed using the
standard protocol during bolus administration of intravenous
contrast. Multiplanar CT image reconstructions and MIPs were
obtained to evaluate the vascular anatomy.
CONTRAST:  100mL [id number] IOPAMIDOL ([id number]-LVK) INJECTION 76%

[Series 4: aorta 3.0 i31f 2 · axial · 0.86mm/px · z∈[-312,-21]mm · 14 of 105 slices shown]
[im 4/105  lung]
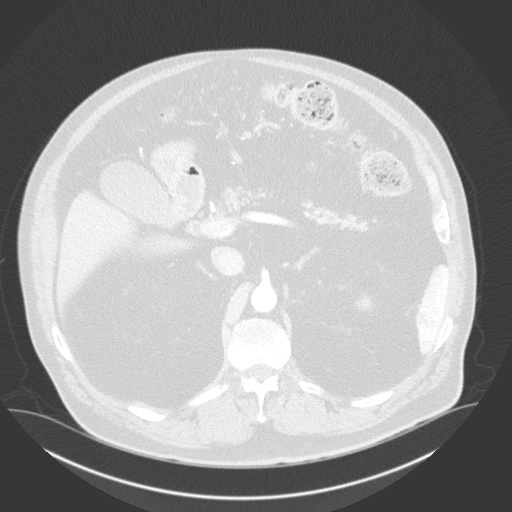
[im 12/105  soft-tissue]
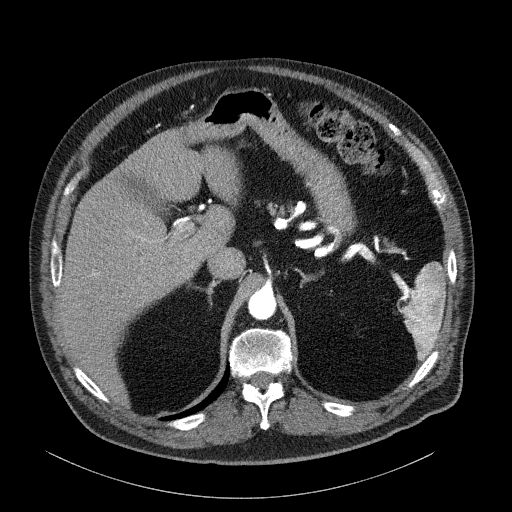
[im 19/105  lung]
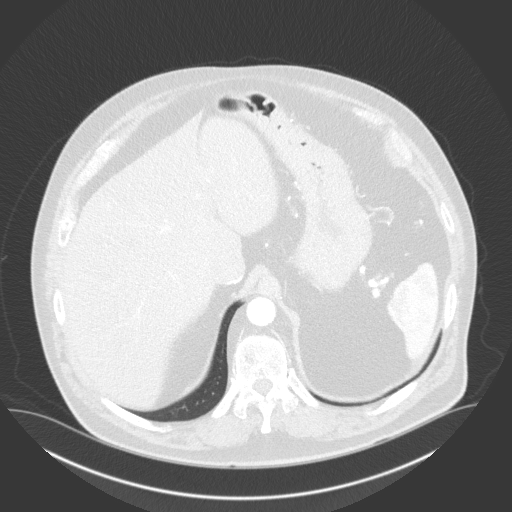
[im 27/105  soft-tissue]
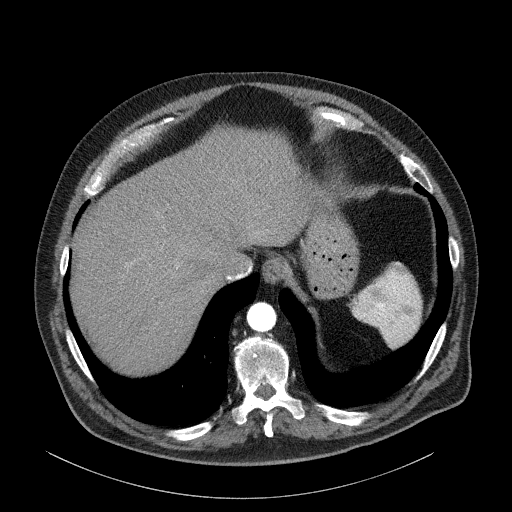
[im 34/105  lung]
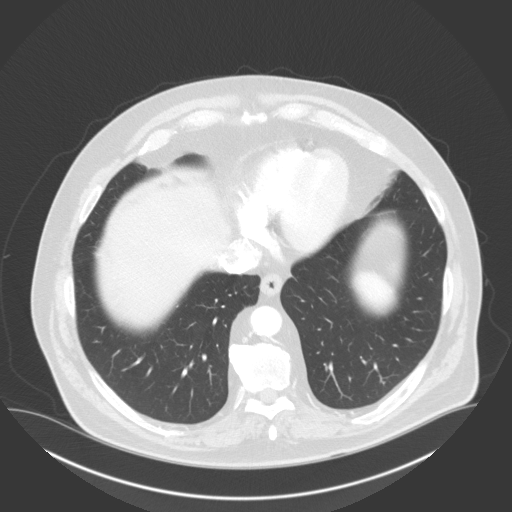
[im 41/105  soft-tissue]
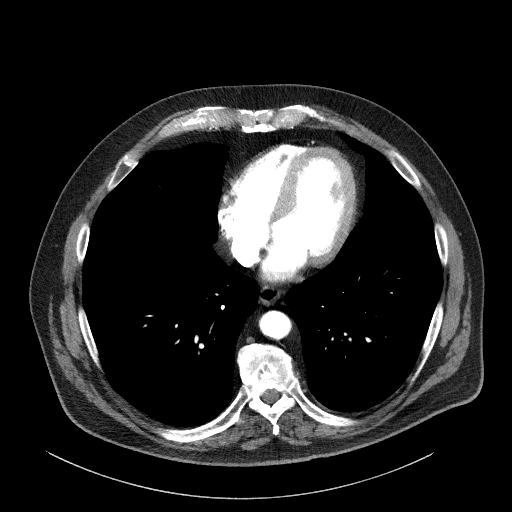
[im 49/105  lung]
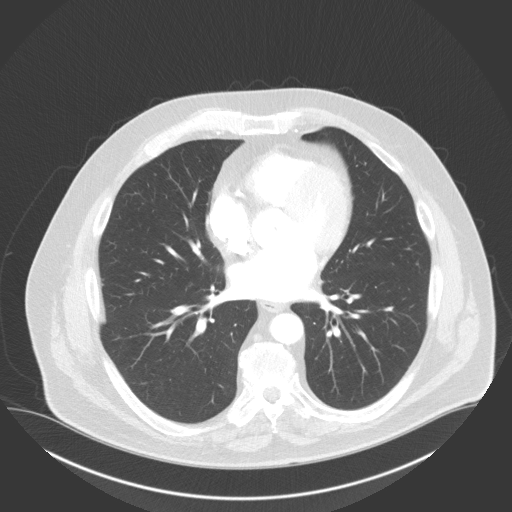
[im 56/105  soft-tissue]
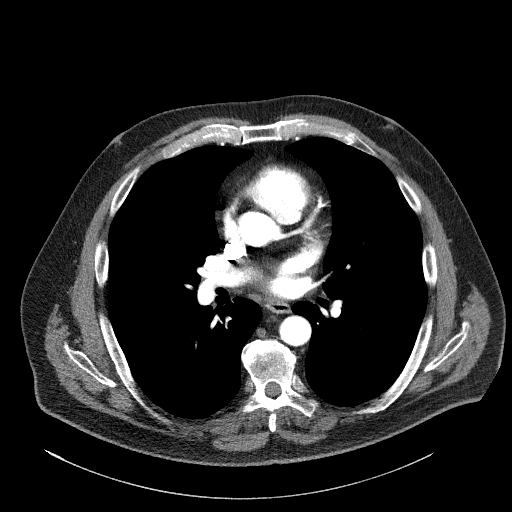
[im 64/105  lung]
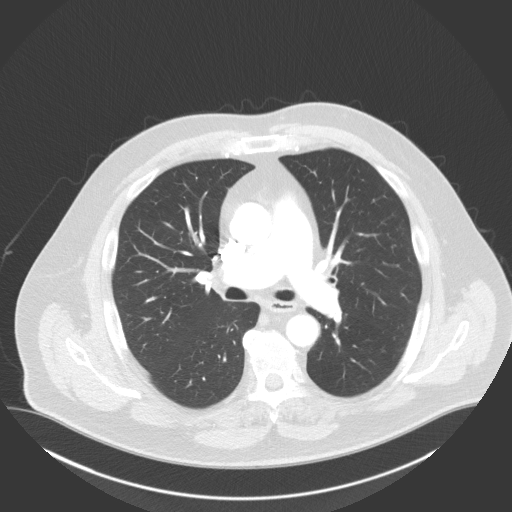
[im 71/105  soft-tissue]
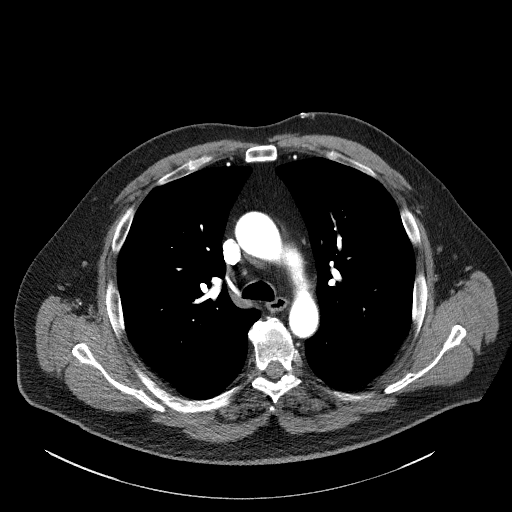
[im 79/105  lung]
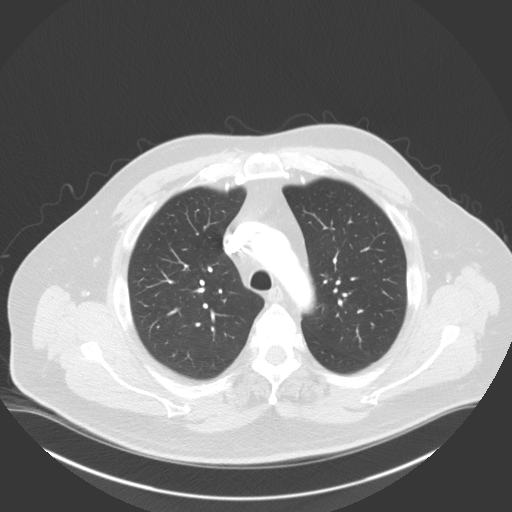
[im 86/105  soft-tissue]
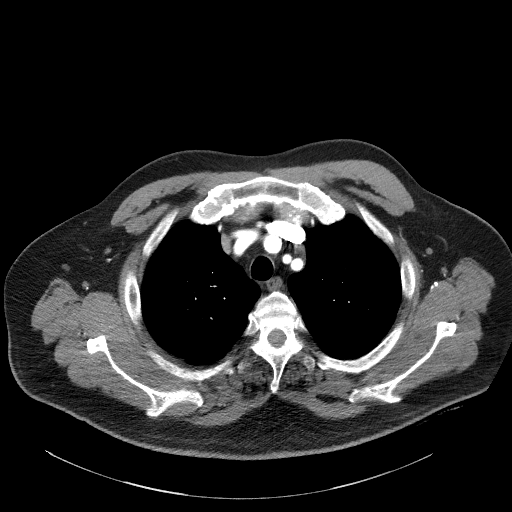
[im 93/105  lung]
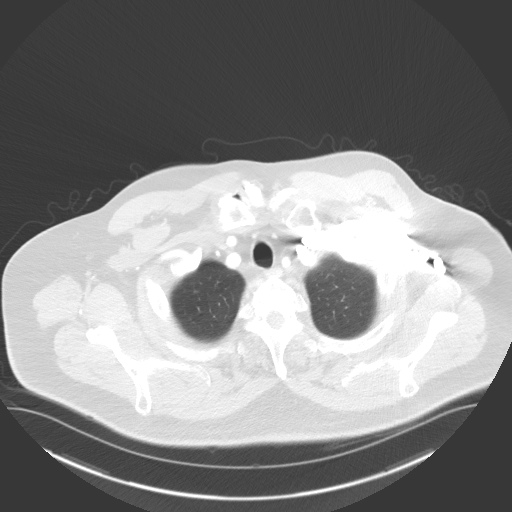
[im 101/105  soft-tissue]
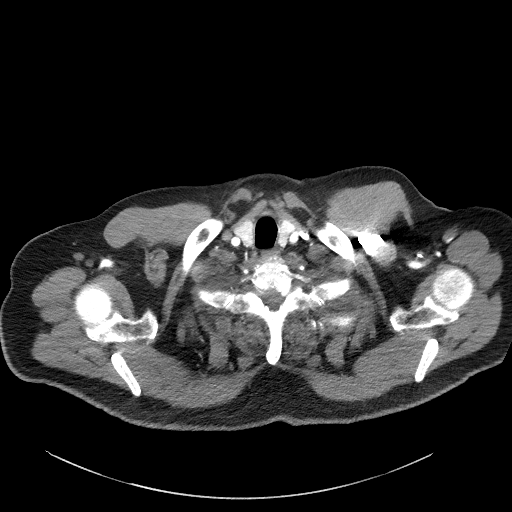

[Series 7: coronals · coronal · 0.63mm/px · 3 of 156 slices shown]
[im 39/156  soft-tissue]
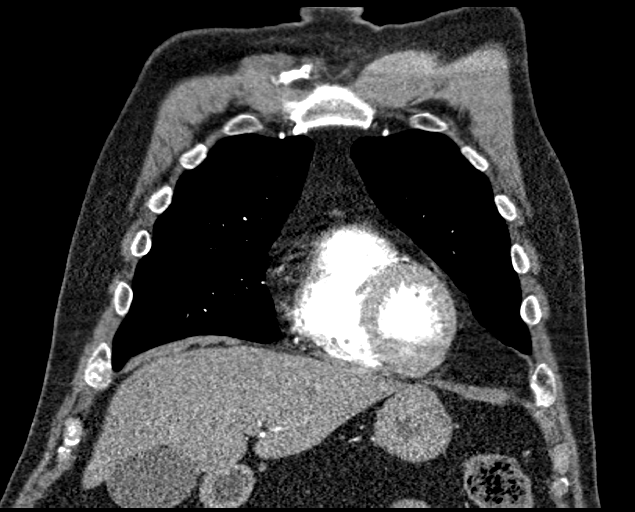
[im 78/156  soft-tissue]
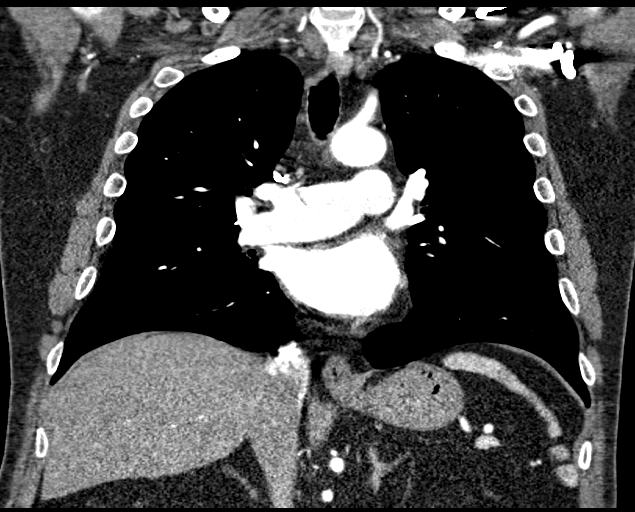
[im 117/156  soft-tissue]
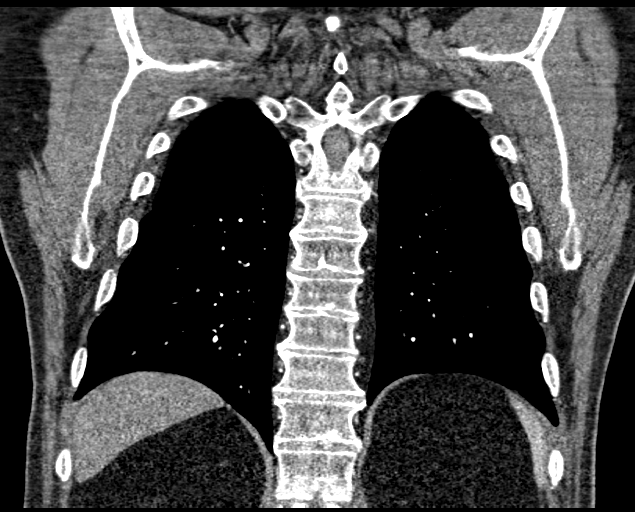

[17 of 46 positions shown; findings below may reference images not displayed]

FINDINGS: Cardiovascular: There is no demonstrable thoracic aortic aneurysm or
dissection. Ascending thoracic aortic diameter measures 3.6 x
cm. Visualized great vessels appear unremarkable. There is no
demonstrable pulmonary embolus. There are occasional foci of
coronary artery calcification. There is no pericardial effusion or
pericardial thickening.

The main pulmonary outflow tract measures 3.4 cm, prominent.

Mediastinum/Nodes: Visualized thyroid appears unremarkable. There is
no appreciable thoracic adenopathy. No esophageal lesions are
evident.

Lungs/Pleura: There is no lung edema or consolidation. There is no
appreciable pleural effusion or pleural thickening. On axial slice
51 series 5, there is a 2 mm nodular opacity in the lateral segment
right middle lobe. On axial slice 59 series 5, there is a 3 mm
nodular opacity in the lateral segment right middle lobe. On axial
slice 59 series 5, there is a 1 mm nodular opacity in the posterior
aspect of the superior segment of the left lower lobe. On axial
slice 70 series 5, there is a 3 mm nodular opacity in the lateral
segment of the left lower lobe. No larger parenchymal nodular
opacities are evident.

Upper Abdomen: Visualized upper abdominal structures appear
unremarkable.

Musculoskeletal: There are foci of degenerative change in the
thoracic spine. There is arthropathy at the sternoclavicular
junction on the right with vacuum phenomenon within the joint at
this site. There are no appreciable blastic or lytic bone lesions.
No chest wall lesions evident.

Review of the MIP images confirms the above findings.
IMPRESSION: 1. No demonstrable thoracic aortic aneurysm or dissection. No
demonstrable pulmonary embolus. Scattered foci of coronary artery
calcification noted.

2. Prominence of the main pulmonary outflow tract, a finding felt to
be indicative of a degree of pulmonary arterial hypertension.

3. No lung edema or consolidation. 1-3 mm nodular opacities noted as
described. No follow-up needed if patient is low-risk (and has no
known or suspected primary neoplasm). Non-contrast chest CT can be
considered in 12 months if patient is high-risk. This recommendation
follows the consensus statement: Guidelines for Management of
Incidental Pulmonary Nodules Detected on CT Images: From the

4.  No evident thoracic adenopathy.

5.  Extensive arthropathy in the right sternoclavicular joint.

ADDENDUM:
Comment: Loop recorder noted on left anteriorly.

*** End of Addendum ***

## 2019-09-11 ENCOUNTER — Other Ambulatory Visit: Payer: Self-pay | Admitting: Interventional Cardiology

## 2019-09-11 MED ORDER — METOPROLOL SUCCINATE ER 25 MG PO TB24: ORAL_TABLET | ORAL | 0 refills | Status: DC

## 2019-09-12 ENCOUNTER — Other Ambulatory Visit: Payer: Self-pay | Admitting: Interventional Cardiology

## 2019-09-12 MED ORDER — METOPROLOL SUCCINATE ER 25 MG PO TB24: ORAL_TABLET | ORAL | 0 refills | Status: DC

## 2019-09-12 NOTE — Telephone Encounter (Signed)
Pt's medication was sent to pt's pharmacy as requested. Confirmation received.  °

## 2019-09-12 NOTE — Telephone Encounter (Signed)
New Message   *STAT* If patient is at the pharmacy, call can be transferred to refill team.   1. Which medications need to be refilled? (please list name of each medication and dose if known) metoprolol succinate (TOPROL-XL) 25 MG 24 hr tablet  2. Which pharmacy/location (including street and city if local pharmacy) is medication to be sent to? [facility] [place] AT [address]  3. Do they need a 30 day or 90 day supply? 90 day   Patient needs refilled today, is out of medication.

## 2019-09-23 ENCOUNTER — Telehealth: Payer: Self-pay | Admitting: Family Medicine

## 2019-09-23 NOTE — Telephone Encounter (Signed)
Lets set this up for a virtual visit

## 2019-09-23 NOTE — Telephone Encounter (Signed)
Pt called and states that once again he has bronchitis. Pt was offered a virtual appt several times and he declined. He states he just want a message back. Pt can be reached at [phone] and uses [facility] on [address].

## 2019-09-23 NOTE — Telephone Encounter (Signed)
Appt. Made [place]

## 2019-09-24 ENCOUNTER — Other Ambulatory Visit: Payer: Self-pay

## 2019-09-24 ENCOUNTER — Other Ambulatory Visit: Payer: Self-pay | Admitting: Family Medicine

## 2019-09-24 ENCOUNTER — Ambulatory Visit (INDEPENDENT_AMBULATORY_CARE_PROVIDER_SITE_OTHER): Payer: PPO | Admitting: Family Medicine

## 2019-09-24 VITALS — BP 128/68 | HR 71 | Wt 240.0 lb

## 2019-09-24 DIAGNOSIS — Z20822 Contact with and (suspected) exposure to covid-19: Secondary | ICD-10-CM

## 2019-09-24 DIAGNOSIS — J4521 Mild intermittent asthma with (acute) exacerbation: Secondary | ICD-10-CM

## 2019-09-24 MED ORDER — ALBUTEROL SULFATE HFA 108 (90 BASE) MCG/ACT IN AERS: 2.0000 | INHALATION_SPRAY | Freq: Four times a day (QID) | RESPIRATORY_TRACT | 0 refills | Status: DC | PRN

## 2019-09-24 MED ORDER — ASMANEX (60 METERED DOSES) 220 MCG/INH IN AEPB: 2.0000 | INHALATION_SPRAY | Freq: Every day | RESPIRATORY_TRACT | 12 refills | Status: DC

## 2019-09-24 NOTE — Progress Notes (Signed)
   Subjective:    Patient ID: Jeremy Tucker, male    DOB: [birth year], 68 y.o.   MRN: [id number]  HPI Documentation for virtual telephone encounter. Documentation for virtual audio and video telecommunications through [place] encounter: The patient was located at home. The provider was located in the office. The patient did consent to this visit and is aware of possible charges through their insurance for this visit. The other persons participating in this telemedicine service were none. This virtual service is not related to other E/M service within previous 7 days. He was apparently around his grandchild last week who had viral illness symptoms.  Last Thursday he developed malaise, chest congestion, coughing, wheezing but no fever, chills, sore throat.    Review of Systems     Objective:   Physical Exam Alert and in no distress.  Minimal coughing was noted.       Assessment & Plan:  Mild intermittent asthmatic bronchitis with acute exacerbation - Plan: albuterol (VENTOLIN HFA) 108 (90 Base) MCG/ACT inhaler, mometasone (ASMANEX, 60 METERED DOSES,) 220 MCG/INH inhaler I explained that this is more likely a viral upper respiratory infection but getting Covid testing will be reasonable.  He should continue on his Asmanex and use Ventolin to help with coughing.

## 2019-09-25 LAB — NOVEL CORONAVIRUS, NAA: SARS-CoV-2, NAA: NOT DETECTED

## 2019-10-14 ENCOUNTER — Telehealth: Payer: Self-pay | Admitting: Family Medicine

## 2019-10-14 MED ORDER — AMOXICILLIN-POT CLAVULANATE 875-125 MG PO TABS: 1.0000 | ORAL_TABLET | Freq: Two times a day (BID) | ORAL | 0 refills | Status: DC

## 2019-10-14 NOTE — Telephone Encounter (Signed)
Pt called and states that he is still sick. Please call pt at [phone]. pt uses [facility] [employer] [employer] and [phone].

## 2019-10-14 NOTE — Telephone Encounter (Signed)
He called stating he is still having difficulty with cough and congestion as well as wheezing.  He continues on Asmanex.  He is also had Covid testing which was negative.  He also had a visitor in his home over the weekend and is concerned about possible exposure. I will call in Augmentin as I think he probably has a secondary bacterial infection.  Also informed that if he is going to be tested he should wait at least 5 days from possible exposure.

## 2019-10-24 ENCOUNTER — Telehealth: Payer: Self-pay | Admitting: Family Medicine

## 2019-10-24 MED ORDER — AMOXICILLIN-POT CLAVULANATE 875-125 MG PO TABS: 1.0000 | ORAL_TABLET | Freq: Two times a day (BID) | ORAL | 0 refills | Status: DC

## 2019-10-24 NOTE — Telephone Encounter (Signed)
Pt called and states that he is still congested and clogged up and that he would like you to give him call regarding this, states he thinks he needs another round of antitiobics but wants to talk to you pt can be reached at [phone]

## 2019-11-26 DIAGNOSIS — N401 Enlarged prostate with lower urinary tract symptoms: Secondary | ICD-10-CM | POA: Diagnosis not present

## 2019-12-04 DIAGNOSIS — R972 Elevated prostate specific antigen [PSA]: Secondary | ICD-10-CM | POA: Diagnosis not present

## 2019-12-04 DIAGNOSIS — N401 Enlarged prostate with lower urinary tract symptoms: Secondary | ICD-10-CM | POA: Diagnosis not present

## 2019-12-04 DIAGNOSIS — R350 Frequency of micturition: Secondary | ICD-10-CM | POA: Diagnosis not present

## 2019-12-05 ENCOUNTER — Telehealth: Payer: Self-pay | Admitting: *Deleted

## 2019-12-05 DIAGNOSIS — Z7901 Long term (current) use of anticoagulants: Secondary | ICD-10-CM

## 2019-12-05 NOTE — Telephone Encounter (Signed)
Patient with diagnosis of afib on Eliquis for anticoagulation.    Procedure: PROSTATE BX  Date of procedure: TBD  CHADS2-VASc score of  4 (CHF, HTN, AGE, DM2)  Patient does have a history of a single DVT post knee surgery  Normal renal function.  Per office protocol, patient can hold Eliquis for 3 days prior to procedure.

## 2019-12-05 NOTE — Telephone Encounter (Signed)
   [facility] Pre-operative Risk Assessment    Request for surgical clearance:  1. What type of surgery is being performed? PROSTATE BX   2. When is this surgery scheduled? TBD   3. What type of clearance is required (medical clearance vs. Pharmacy clearance to hold med vs. Both)? BOTH  4. Are there any medications that need to be held prior to surgery and how long? ELIQUIS 3 DAYS PRIOR TO PROCEDURE   5. Practice name and name of physician performing surgery? [facility]; DR. Annie Main DAHLSTEDT    6. What is your office phone number [phone]    7.   What is your office fax number [phone]  8.   Anesthesia type (None, local, MAC, general) ? NOT LISTED   Jeremy Tucker 12/05/2019, 12:58 PM  _________________________________________________________________   (provider comments below)

## 2019-12-05 NOTE — Telephone Encounter (Signed)
Pt has moderate aortic stenosis. He is overdue for follow up. He has an appt with Dr. Tamala Julian on 1/26.  I will route this clearance request to Dr. Tamala Julian.  I will also route request to pharmacy for input on anticoagulation.

## 2019-12-06 DIAGNOSIS — J342 Deviated nasal septum: Secondary | ICD-10-CM | POA: Diagnosis not present

## 2019-12-06 DIAGNOSIS — J324 Chronic pansinusitis: Secondary | ICD-10-CM | POA: Insufficient documentation

## 2019-12-06 DIAGNOSIS — J3489 Other specified disorders of nose and nasal sinuses: Secondary | ICD-10-CM | POA: Insufficient documentation

## 2019-12-06 DIAGNOSIS — J339 Nasal polyp, unspecified: Secondary | ICD-10-CM | POA: Insufficient documentation

## 2019-12-06 NOTE — Telephone Encounter (Signed)
See instruction below

## 2019-12-06 NOTE — Telephone Encounter (Signed)
If no new cardiac symptoms, cleared for the biopsy. Okay to hold Eliquis for 72 hours  prior to biopsy or longer if Urology feels necessary.

## 2019-12-06 NOTE — Telephone Encounter (Signed)
Left message for pt to call back to preop clinic to review any changes in status since last seen.

## 2019-12-09 NOTE — Progress Notes (Signed)
Cardiology Office Note:    Date:  12/10/2019   ID:  KELON CONKIN, DOB 1950-12-16, MRN [id number]  PCP:  Denita Lung, MD  Cardiologist:  [name], MD   Referring MD: Denita Lung, MD   Chief Complaint  Patient presents with  . Cardiac Valve Problem    Aortic stenosis  . Atrial Fibrillation    History of Present Illness:    Jeremy Tucker is a 69 y.o. male with a hx of PAF with hx of ablation 02/2015, HTN, mod aortic stenosis, GERD, HLD and anxiety disorder, chronic anticoagulation with Eliquis.  Fractured his hip in February 2020.  He was in hospital for 7 days.  Had to have rehab.  No cardiac complications during surgery or in rehab.  Over the last 12 months he has had no chest discomfort, syncope, palpitations, orthopnea, PND, or exertional dyspnea.  He has been less active due to the COVID-19 pandemic and isolation.  He has not been affected by COVID-19.  Both he and his wife had significant falls this past 12 months with fracture.  Seems to be doing quite well now.  Past Medical History:  Diagnosis Date  . Aortic stenosis, mild   . Atrial fibrillation ([place])   . BPH (benign prostatic hyperplasia)   . Bronchitis    patient mentions " i am prone to bronchitis"   . Controlled diabetes mellitus type 2 with complications ([place])   . Depression   . Diabetic peripheral neuropathy ([place]) 01/07/2019  . DJD (degenerative joint disease)   . DVT (deep venous thrombosis) ([place])    post op knee surgery in 2001 , behind calf  ; was resolved with blood thinners   . Dysrhythmia    Atrial fibrillation  . Gastric polyp 08/02/2018   see report in chart  . GERD (gastroesophageal reflux disease)   . Glucose intolerance (impaired glucose tolerance)   . Gout    denies  . Hiatal hernia 08/02/2018   see report in chart  . Hyperlipidemia   . Hyperlipidemia associated with type 2 diabetes mellitus ([place])   . Hypertension    denies  . Hypertension associated with diabetes ([place])   .  Kidney stones   . Obesity   . 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 Persistent atrial fibrillation ([place])    a. CHADsVASc score at least 2  . Right carpal tunnel syndrome 01/07/2019  . Status post placement of implantable loop recorder 2 years ago ; 2016   left upper chest     Past Surgical History:  Procedure Laterality Date  . ATRIAL FIBRILLATION ABLATION N/A 03/12/2015   Procedure: ATRIAL FIBRILLATION ABLATION;  Surgeon: [name], MD;  [facility] C[facility] LAB;  Service: Cardiovascular;  Laterality: N/A;  . CARDIAC CATHETERIZATION  1990's   "Dr. Melvern Banker", no blockages per pt  . CARDIOVERSION N/A 02/25/2015   Procedure: CARDIOVERSION;  Surgeon: Sueanne Margarita, MD;  Location: [facility];  Service: Cardiovascular;  Laterality: N/A;  . COLONOSCOPY  2010   [facility]OFF  . CYSTOSCOPY W/ STONE MANIPULATION  1980's   "couldn't get stone so they had to cut me open"  . CYSTOSCOPY WITH RETROGRADE PYELOGRAM, URETEROSCOPY AND STENT PLACEMENT Left 04/24/2015   Procedure: URETHRAL MEATAL DILATION, CYSTOSCOPY WITH LEFT RETROGRADE PYELOGRAM, LEFT URETEROSCOPY, STONE BASKET EXTRACTION,  LEFT DOUBLE J STENT PLACEMENT;  Surgeon: Carolan Clines, MD;  Location: [facility];  Service: Urology;  Laterality: Left;  . EP IMPLANTABLE DEVICE N/A 09/15/2015  Procedure: Loop Recorder Insertion;  Surgeon: [name], MD;  Location: [facility] CV LAB;  Service: Cardiovascular;  Laterality: N/A;  . EYE SURGERY Bilateral 2016   cataract surgery with implant ; dr Gerald Stabs grote   . FOREARM FRACTURE SURGERY Right ~ 1961  . FRACTURE SURGERY    . HOLMIUM LASER APPLICATION Left [id number]   Procedure: HOLMIUM LASER APPLICATION;  Surgeon: Carolan Clines, MD;  Location: [facility];  Service: Urology;  Laterality: Left;  . JOINT REPLACEMENT    . KIDNEY STONE SURGERY  1980's   "stone lodged in the duct; had to cut me open to get it"  . KNEE ARTHROSCOPY Bilateral "multiple times"  . LEFT HEART CATH AND CORONARY  ANGIOGRAPHY N/A 09/19/2018   Procedure: LEFT HEART CATH AND CORONARY ANGIOGRAPHY;  Surgeon: Belva Crome, MD;  Location: [facility] CV LAB;  Service: Cardiovascular;  Laterality: N/A;  . TEE WITHOUT CARDIOVERSION N/A 03/12/2015   Procedure: TRANSESOPHAGEAL ECHOCARDIOGRAM (TEE);  Surgeon: Pixie Casino, MD;  Location: [facility];  Service: Cardiovascular;  Laterality: N/A;  . TONSILLECTOMY    . TOTAL KNEE ARTHROPLASTY Left 2001   [facility]UR[facility]  . TOTAL KNEE ARTHROPLASTY Right 10/30/2017   Procedure: RIGHT TOTAL KNEE ARTHROPLASTY;  Surgeon: Paralee Cancel, MD;  Location: [facility];  Service: Orthopedics;  Laterality: Right;  90 mins    Current Medications: Current Meds  Medication Sig  . acetaminophen (TYLENOL) 500 MG tablet Take 1,000 mg by mouth every 8 (eight) hours as needed for mild pain.   Marland Kitchen albuterol (VENTOLIN HFA) 108 (90 Base) MCG/ACT inhaler INHALE 2 PUFFS INTO THE LUNGS EVERY 6 HOURS AS NEEDED FOR WHEEZING OR SHORTNESS OF BREATH  . allopurinol (ZYLOPRIM) 300 MG tablet Take 300 mg by mouth daily.    Marland Kitchen ammonium lactate (LAC-HYDRIN) 12 % lotion Apply 1 application topically daily as needed for dry skin (3-4 TIMES WEEK).   Marland Kitchen amoxicillin-clavulanate (AUGMENTIN) 875-125 MG tablet Take 1 tablet by mouth 2 (two) times daily.  Marland Kitchen atorvastatin (LIPITOR) 20 MG tablet TAKE 1 TABLET(20 MG) BY MOUTH DAILY  . buPROPion (WELLBUTRIN XL) 300 MG 24 hr tablet Take 1 tablet (300 mg total) by mouth daily.  Marland Kitchen diltiazem (TIAZAC) 180 MG 24 hr capsule Take 180 mg by mouth daily.  Marland Kitchen ELIQUIS 5 MG TABS tablet TAKE 1 TABLET BY MOUTH TWICE DAILY  . loratadine (CLARITIN) 10 MG tablet Take 10 mg by mouth daily.  Marland Kitchen LORazepam (ATIVAN) 0.5 MG tablet TAKE 1 TABLET TWICE DAILY AS NEEDED FOR ANXIETY.  . metoprolol succinate (TOPROL-XL) 25 MG 24 hr tablet TAKE 1 TABLET BY MOUTH DAILY IN THE EVENING. Please keep upcoming appt in January with Dr. Tamala Julian before anymore refills. Thank you  . mometasone (ASMANEX, 60 METERED DOSES,)  220 MCG/INH inhaler Inhale 2 puffs into the lungs daily.  . Multiple Vitamins-Minerals (MULTIVITAMIN WITH MINERALS) tablet Take 1 tablet by mouth daily.    Marland Kitchen olopatadine (PATANOL) 0.1 % ophthalmic solution Place 1 drop into both eyes daily as needed for allergies (tearing).   . Omega-3 Fatty Acids (FISH OIL TRIPLE STRENGTH) 1400 MG CAPS Take 1,400 mg by mouth daily.   Marland Kitchen omeprazole (PRILOSEC) 20 MG capsule Take 20 mg by mouth daily.  . polyethylene glycol (MIRALAX / GLYCOLAX) packet Take 17 g by mouth 2 (two) times daily.  . sertraline (ZOLOFT) 50 MG tablet TAKE 1 TABLET(50 MG) BY MOUTH DAILY  . tamsulosin (FLOMAX) 0.4 MG CAPS capsule Take 0.4 mg by mouth daily.  Allergies:   Codeine and Oxycodone   Social History   Socioeconomic History  . Marital status: Married    Spouse name: Not on file  . Number of children: 1  . Years of education: Not on file  . Highest education level: Some college, no degree  Occupational History  . Occupation: Retired  Tobacco Use  . Smoking status: Never Smoker  . Smokeless tobacco: Never Used  Substance and Sexual Activity  . Alcohol use: Yes    Comment: seldom   . Drug use: No  . Sexual activity: Yes  Other Topics Concern  . Not on file  Social History Narrative   Pt lives in [place] with spouse.  12 yo son is health.  Second son died in car accident.      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 Difficulty of Paying Living Expenses: Not on file  Food Insecurity:   . Worried About Charity fundraiser in the Last Year: Not on file  . Ran Out of Food in the Last Year: Not on file  Transportation Needs:   . Lack of Transportation (Medical): Not on file  . Lack of Transportation (Non-Medical): Not on file  Physical Activity:   . Days of Exercise per Week: Not on file  . Minutes of Exercise per Session: Not on file  Stress:   .  Feeling of Stress : Not on file  Social Connections:   . Frequency of Communication with Friends and Family: Not on file  . Frequency of Social Gatherings with Friends and Family: Not on file  . Attends Religious Services: Not on file  . Active Member of Clubs or Organizations: Not on file  . Attends Archivist Meetings: Not on file  . Marital Status: Not on file     Family History: The patient's family history includes Healthy in his brother; Heart attack (age of onset: 47) in his father; Heart attack (age of onset: 34) in his mother.  ROS:   Please see the history of present illness.    Has a prostate biopsy coming up by Dr. Diona Fanti.  All other systems reviewed and are negative.  EKGs/Labs/Other Studies Reviewed:    The following studies were reviewed today: No new data  EKG:  EKG normal sinus rhythm, EKG.  PR interval is normal.  Recent Labs: No results found for requested labs within last 8760 hours.  Recent Lipid Panel    Component Value Date/Time   CHOL 194 02/19/2018 1110   TRIG 261 (H) 02/19/2018 1110   HDL 45 02/19/2018 1110   CHOLHDL 4.3 02/19/2018 1110   CHOLHDL 3.6 08/16/2016 0925   VLDL 28 08/16/2016 0925   LDLCALC 97 02/19/2018 1110    Physical Exam:    VS:  BP 134/76   Pulse 67   Ht 6' (1.829 m)   Wt 244 lb 1.9 oz (110.7 kg)   SpO2 96%   BMI 33.11 kg/m     Wt Readings from Last 3 Encounters:  12/10/19 244 lb 1.9 oz (110.7 kg)  09/24/19 240 lb (108.9 kg)  07/24/19 240 lb (108.9 kg)     GEN: Abdominal obesity. No acute distress HEENT: Normal NECK: No JVD.  The carotid upstroke is slightly delayed and of low amplitude LYMPHATICS: No lymphadenopathy CARDIAC: 3/6 right upper sternal border systolic crescendo decrescendo murmur.  RRR without diastolic murmur, gallop, or edema. VASCULAR:  Normal Pulses. No bruits. RESPIRATORY:  Clear to auscultation without rales, wheezing or rhonchi  ABDOMEN: Soft, non-tender, non-distended, No  pulsatile mass, MUSCULOSKELETAL: No deformity  SKIN: Warm and dry NEUROLOGIC:  Alert and oriented x 3 PSYCHIATRIC:  Normal affect   ASSESSMENT:    1. Aortic stenosis, moderate   2. Preoperative clearance   3. Chronic anticoagulation   4. Paroxysmal atrial fibrillation (HCC)   5. Essential hypertension   6. Hyperlipidemia associated with type 2 diabetes mellitus ([place])   7. OSA (obstructive sleep apnea)   8. Educated about COVID-19 virus infection    PLAN:    In order of problems listed above:  1. At least moderate aortic stenosis.  2D Doppler echocardiogram soon to establish current status compared with most recent study in 2019. 2. CLEARED FOR PROSTATE BIOPSY.  The patient is clinically stable.  There are absolutely no symptoms to suggest significant progression of aortic stenosis.. 3. No bleeding on Eliquis.  Instructed to hold Eliquis for 72 hours prior to biopsy and resume when instructed by Dr. Diona Fanti 4. No symptomatic episodes of atrial fibrillation that he can recall during this past 12 months. 5. Blood pressure control is adequate 6. LDL cholesterol is reasonable with 97 and April 2019.  Given aortic stenosis and other risk factors I would prefer a target of 70. 7. CPAP compliance advocated 8. 3W's is being practiced.  COVID-19 vaccination is advocated and accepted by the patient.   Medication Adjustments/Labs and Tests Ordered: Current medicines are reviewed at length with the patient today.  Concerns regarding medicines are outlined above.  Orders Placed This Encounter  Procedures  . EKG 12-Lead  . ECHOCARDIOGRAM COMPLETE   No orders of the defined types were placed in this encounter.   Patient Instructions  Medication Instructions:  Your physician recommends that you continue on your current medications as directed. Please refer to the Current Medication list given to you today.  *If you need a refill on your cardiac medications before your next appointment,  please call your pharmacy*  Lab Work: None If you have labs (blood work) drawn today and your tests are completely normal, you will receive your results only by: Marland Kitchen MyChart Message (if you have MyChart) OR . A paper copy in the mail If you have any lab test that is abnormal or we need to change your treatment, we will call you to review the results.  Testing/Procedures: Your physician has requested that you have an echocardiogram. Echocardiography is a painless test that uses sound waves to create images of your heart. It provides your doctor with information about the size and shape of your heart and how well your heart's chambers and valves are working. This procedure takes approximately one hour. There are no restrictions for this procedure.   Follow-Up: At [facility], you and your health needs are our priority.  As part of our continuing mission to provide you with exceptional heart care, we have created designated Provider Care Teams.  These Care Teams include your primary Cardiologist (physician) and Advanced Practice Providers (APPs -  Physician Assistants and Nurse Practitioners) who all work together to provide you with the care you need, when you need it.  Your next appointment:   12 month(s)  The format for your next appointment:   In Person  Provider:   You may see [name], MD or one of the following Advanced Practice Providers on your  designated Care Team:    Truitt Merle, NP  Cecilie Kicks, NP  Kathyrn Drown, NP   Other Instructions      Signed, [name], MD  12/10/2019 11:39 AM    [place]

## 2019-12-10 ENCOUNTER — Other Ambulatory Visit: Payer: Self-pay

## 2019-12-10 ENCOUNTER — Other Ambulatory Visit: Payer: PPO

## 2019-12-10 ENCOUNTER — Ambulatory Visit: Payer: PPO | Admitting: Interventional Cardiology

## 2019-12-10 ENCOUNTER — Encounter: Payer: Self-pay | Admitting: Interventional Cardiology

## 2019-12-10 VITALS — BP 134/76 | HR 67 | Ht 72.0 in | Wt 244.1 lb

## 2019-12-10 DIAGNOSIS — I480 Paroxysmal atrial fibrillation: Secondary | ICD-10-CM | POA: Diagnosis not present

## 2019-12-10 DIAGNOSIS — Z7901 Long term (current) use of anticoagulants: Secondary | ICD-10-CM

## 2019-12-10 DIAGNOSIS — G4733 Obstructive sleep apnea (adult) (pediatric): Secondary | ICD-10-CM | POA: Diagnosis not present

## 2019-12-10 DIAGNOSIS — I10 Essential (primary) hypertension: Secondary | ICD-10-CM

## 2019-12-10 DIAGNOSIS — Z7189 Other specified counseling: Secondary | ICD-10-CM | POA: Diagnosis not present

## 2019-12-10 DIAGNOSIS — E785 Hyperlipidemia, unspecified: Secondary | ICD-10-CM

## 2019-12-10 DIAGNOSIS — Z01818 Encounter for other preprocedural examination: Secondary | ICD-10-CM

## 2019-12-10 DIAGNOSIS — I350 Nonrheumatic aortic (valve) stenosis: Secondary | ICD-10-CM | POA: Diagnosis not present

## 2019-12-10 DIAGNOSIS — E1169 Type 2 diabetes mellitus with other specified complication: Secondary | ICD-10-CM | POA: Diagnosis not present

## 2019-12-10 NOTE — Telephone Encounter (Signed)
Pt ha appt today with Dr. Tamala Julian @ 10:20 for pre op clearance.

## 2019-12-10 NOTE — Patient Instructions (Signed)
Medication Instructions:  Your physician recommends that you continue on your current medications as directed. Please refer to the Current Medication list given to you today.  *If you need a refill on your cardiac medications before your next appointment, please call your pharmacy*  Lab Work: None If you have labs (blood work) drawn today and your tests are completely normal, you will receive your results only by: Marland Kitchen MyChart Message (if you have MyChart) OR . A paper copy in the mail If you have any lab test that is abnormal or we need to change your treatment, we will call you to review the results.  Testing/Procedures: Your physician has requested that you have an echocardiogram. Echocardiography is a painless test that uses sound waves to create images of your heart. It provides your doctor with information about the size and shape of your heart and how well your heart's chambers and valves are working. This procedure takes approximately one hour. There are no restrictions for this procedure.   Follow-Up: At [facility], you and your health needs are our priority.  As part of our continuing mission to provide you with exceptional heart care, we have created designated Provider Care Teams.  These Care Teams include your primary Cardiologist (physician) and Advanced Practice Providers (APPs -  Physician Assistants and Nurse Practitioners) who all work together to provide you with the care you need, when you need it.  Your next appointment:   12 month(s)  The format for your next appointment:   In Person  Provider:   You may see [name], MD or one of the following Advanced Practice Providers on your designated Care Team:    Truitt Merle, NP  Cecilie Kicks, NP  Kathyrn Drown, NP   Other Instructions

## 2019-12-10 NOTE — Telephone Encounter (Signed)
DOAC instructions also addressed by Dr Tamala Julian (see OV note for detail)

## 2019-12-11 ENCOUNTER — Other Ambulatory Visit: Payer: Self-pay | Admitting: Interventional Cardiology

## 2019-12-11 LAB — BASIC METABOLIC PANEL
BUN/Creatinine Ratio: 18 (ref 10–24)
BUN: 15 mg/dL (ref 8–27)
CO2: 24 mmol/L (ref 20–29)
Calcium: 9.6 mg/dL (ref 8.6–10.2)
Chloride: 101 mmol/L (ref 96–106)
Creatinine, Ser: 0.84 mg/dL (ref 0.76–1.27)
GFR calc Af Amer: 103 mL/min/{1.73_m2} (ref >59)
GFR calc non Af Amer: 89 mL/min/{1.73_m2} (ref >59)
Glucose: 169 mg/dL — ABNORMAL HIGH (ref 65–99)
Potassium: 4.5 mmol/L (ref 3.5–5.2)
Sodium: 142 mmol/L (ref 134–144)

## 2019-12-11 MED ORDER — APIXABAN 5 MG PO TABS: 5.0000 mg | ORAL_TABLET | Freq: Two times a day (BID) | ORAL | 1 refills | Status: DC

## 2019-12-11 MED ORDER — METOPROLOL SUCCINATE ER 25 MG PO TB24: ORAL_TABLET | ORAL | 3 refills | Status: DC

## 2019-12-11 NOTE — Telephone Encounter (Signed)
Pt last saw Dr Tamala Julian 12/10/19, last labs 12/10/19 Creat 0.84, age 69, weight 110.7kg, based on specified criteria pt is on appropriate dosage of Eliquis 5mg  BID.  Will refill rx.

## 2019-12-24 ENCOUNTER — Ambulatory Visit (HOSPITAL_COMMUNITY): Payer: PPO | Attending: Interventional Cardiology

## 2019-12-24 ENCOUNTER — Other Ambulatory Visit: Payer: Self-pay

## 2019-12-24 DIAGNOSIS — I350 Nonrheumatic aortic (valve) stenosis: Secondary | ICD-10-CM | POA: Diagnosis not present

## 2020-01-18 ENCOUNTER — Other Ambulatory Visit: Payer: Self-pay | Admitting: Family Medicine

## 2020-01-18 DIAGNOSIS — F329 Major depressive disorder, single episode, unspecified: Secondary | ICD-10-CM

## 2020-01-18 DIAGNOSIS — F32A Depression, unspecified: Secondary | ICD-10-CM

## 2020-01-18 DIAGNOSIS — F419 Anxiety disorder, unspecified: Secondary | ICD-10-CM

## 2020-01-20 ENCOUNTER — Other Ambulatory Visit: Payer: Self-pay | Admitting: Family Medicine

## 2020-01-20 DIAGNOSIS — F419 Anxiety disorder, unspecified: Secondary | ICD-10-CM

## 2020-01-20 DIAGNOSIS — J329 Chronic sinusitis, unspecified: Secondary | ICD-10-CM | POA: Diagnosis not present

## 2020-01-20 DIAGNOSIS — F32A Depression, unspecified: Secondary | ICD-10-CM

## 2020-01-20 DIAGNOSIS — J339 Nasal polyp, unspecified: Secondary | ICD-10-CM | POA: Diagnosis not present

## 2020-01-20 DIAGNOSIS — J3489 Other specified disorders of nose and nasal sinuses: Secondary | ICD-10-CM | POA: Diagnosis not present

## 2020-01-20 DIAGNOSIS — F329 Major depressive disorder, single episode, unspecified: Secondary | ICD-10-CM

## 2020-01-20 MED ORDER — SERTRALINE HCL 50 MG PO TABS: ORAL_TABLET | ORAL | 0 refills | Status: DC

## 2020-01-20 NOTE — Telephone Encounter (Signed)
He needs an office visit.  

## 2020-01-20 NOTE — Telephone Encounter (Signed)
Pt will call back to schedule his appt. For a med check [facility]

## 2020-01-20 NOTE — Telephone Encounter (Signed)
Received fax from Krist[facility] Oppenheim for a refill on the pts. Bupropion for a 90 day supply pt. Lats apt 09/24/19.

## 2020-01-20 NOTE — Telephone Encounter (Signed)
[facility] is requesting to fill pt zoloft. Please advise [facility] 

## 2020-01-21 ENCOUNTER — Telehealth: Payer: Self-pay | Admitting: Family Medicine

## 2020-01-21 MED ORDER — BUPROPION HCL ER (XL) 300 MG PO TB24: 300.0000 mg | ORAL_TABLET | Freq: Every day | ORAL | 0 refills | Status: DC

## 2020-01-21 NOTE — Telephone Encounter (Signed)
Pt called and is requesting a refill on his wellbutrin please send to the [facility], [place]  Pt made a medcheck appt march the 16th

## 2020-01-24 DIAGNOSIS — R972 Elevated prostate specific antigen [PSA]: Secondary | ICD-10-CM | POA: Diagnosis not present

## 2020-01-24 DIAGNOSIS — C61 Malignant neoplasm of prostate: Secondary | ICD-10-CM | POA: Diagnosis not present

## 2020-01-28 ENCOUNTER — Ambulatory Visit (INDEPENDENT_AMBULATORY_CARE_PROVIDER_SITE_OTHER): Payer: PPO | Admitting: Family Medicine

## 2020-01-28 ENCOUNTER — Other Ambulatory Visit: Payer: Self-pay

## 2020-01-28 ENCOUNTER — Encounter: Payer: Self-pay | Admitting: Family Medicine

## 2020-01-28 VITALS — BP 138/80 | HR 69 | Temp 98.7°F | Wt 240.8 lb

## 2020-01-28 DIAGNOSIS — E785 Hyperlipidemia, unspecified: Secondary | ICD-10-CM | POA: Diagnosis not present

## 2020-01-28 DIAGNOSIS — G4733 Obstructive sleep apnea (adult) (pediatric): Secondary | ICD-10-CM | POA: Diagnosis not present

## 2020-01-28 DIAGNOSIS — Z87442 Personal history of urinary calculi: Secondary | ICD-10-CM

## 2020-01-28 DIAGNOSIS — I350 Nonrheumatic aortic (valve) stenosis: Secondary | ICD-10-CM | POA: Diagnosis not present

## 2020-01-28 DIAGNOSIS — I480 Paroxysmal atrial fibrillation: Secondary | ICD-10-CM

## 2020-01-28 DIAGNOSIS — E118 Type 2 diabetes mellitus with unspecified complications: Secondary | ICD-10-CM

## 2020-01-28 DIAGNOSIS — I10 Essential (primary) hypertension: Secondary | ICD-10-CM | POA: Diagnosis not present

## 2020-01-28 DIAGNOSIS — K219 Gastro-esophageal reflux disease without esophagitis: Secondary | ICD-10-CM | POA: Diagnosis not present

## 2020-01-28 DIAGNOSIS — F419 Anxiety disorder, unspecified: Secondary | ICD-10-CM

## 2020-01-28 DIAGNOSIS — G5731 Lesion of lateral popliteal nerve, right lower limb: Secondary | ICD-10-CM

## 2020-01-28 DIAGNOSIS — I152 Hypertension secondary to endocrine disorders: Secondary | ICD-10-CM

## 2020-01-28 DIAGNOSIS — E1159 Type 2 diabetes mellitus with other circulatory complications: Secondary | ICD-10-CM | POA: Diagnosis not present

## 2020-01-28 DIAGNOSIS — E1169 Type 2 diabetes mellitus with other specified complication: Secondary | ICD-10-CM | POA: Diagnosis not present

## 2020-01-28 DIAGNOSIS — M1990 Unspecified osteoarthritis, unspecified site: Secondary | ICD-10-CM

## 2020-01-28 DIAGNOSIS — J4531 Mild persistent asthma with (acute) exacerbation: Secondary | ICD-10-CM | POA: Diagnosis not present

## 2020-01-28 DIAGNOSIS — Z7901 Long term (current) use of anticoagulants: Secondary | ICD-10-CM | POA: Diagnosis not present

## 2020-01-28 DIAGNOSIS — F32A Depression, unspecified: Secondary | ICD-10-CM

## 2020-01-28 DIAGNOSIS — E669 Obesity, unspecified: Secondary | ICD-10-CM | POA: Diagnosis not present

## 2020-01-28 DIAGNOSIS — F329 Major depressive disorder, single episode, unspecified: Secondary | ICD-10-CM

## 2020-01-28 LAB — POCT GLYCOSYLATED HEMOGLOBIN (HGB A1C): Hemoglobin A1C: 7.3 % — AB (ref 4.0–5.6)

## 2020-01-28 LAB — POCT UA - MICROALBUMIN
Albumin/Creatinine Ratio, Urine, POC: 35.4
Creatinine, POC: 230.9 mg/dL
Microalbumin Ur, POC: 81.8 mg/L

## 2020-01-28 MED ORDER — METOPROLOL SUCCINATE ER 25 MG PO TB24: ORAL_TABLET | ORAL | 3 refills | Status: DC

## 2020-01-28 MED ORDER — ATORVASTATIN CALCIUM 20 MG PO TABS: ORAL_TABLET | ORAL | 3 refills | Status: DC

## 2020-01-28 MED ORDER — SERTRALINE HCL 50 MG PO TABS: 50.0000 mg | ORAL_TABLET | Freq: Every day | ORAL | 3 refills | Status: DC

## 2020-01-28 MED ORDER — DILTIAZEM HCL ER BEADS 180 MG PO CP24: 180.0000 mg | ORAL_CAPSULE | Freq: Every day | ORAL | 3 refills | Status: DC

## 2020-01-28 MED ORDER — BUPROPION HCL ER (XL) 300 MG PO TB24: 300.0000 mg | ORAL_TABLET | Freq: Every day | ORAL | 0 refills | Status: DC

## 2020-01-28 MED ORDER — ALLOPURINOL 300 MG PO TABS: 300.0000 mg | ORAL_TABLET | Freq: Every day | ORAL | 3 refills | Status: DC

## 2020-01-28 NOTE — Progress Notes (Signed)
Subjective:    Patient ID: Jeremy Tucker, male    DOB: [birth year], 69 y.o.   MRN: [id number]  Jeremy Tucker is a 69 y.o. male who presents for follow-up of Type 2 diabetes mellitus. Home blood sugar records: not checkng Current symptoms/problems include none at this time. Daily foot checks: yes   Any foot concerns: none Exercise: walking Diet: regular.  Admits to dietary indiscretion. He is on metoprolol and diltiazem for his blood pressure.  He also has underlying atrial fibrillation and aortic stenosis.  He follows up regularly with cardiology concerning that.  He is also being followed by urology.  He did have a PSA of 6.9 and is now waiting on biopsy results.  Is also seen within the last several months and diagnosed with a peroneal neuropathy but states that he is getting stronger and stronger with less foot drop.  His asthma seems to be under good control.  He has seen ENT and apparently does have a polyp that will be followed up on.  Psychologically he seems to doing quite nicely on Wellbutrin and sertraline.  He is very comfortable with this.  He does occasionally take an Ativan to help with anxiety but has not taken one in several months.  He does have underlying OSA.  He continues on allopurinol to help treat history of underlying renal stones.  He does have underlying asthma and is handling this very well The following portions of the patient's history were reviewed and updated as appropriate: allergies, current medications, past medical history, past social history and problem list.  ROS as in subjective above.     Objective:    Physical Exam Alert and in no distress. Tympanic membranes and canals are normal. Pharyngeal area is normal. Neck is supple without adenopathy or thyromegaly. Cardiac exam shows a regular sinus rhythm without murmurs or gallops. Lungs are clear to auscultation. Hemoglobin A1c is 7.3 Lab Review Diabetic Labs Latest Ref Rng & Units 12/10/2019 09/14/2018  09/10/2018 02/19/2018 10/31/2017  HbA1c 4.8 - 5.6 % - - - - -  Chol 100 - 199 mg/dL - - - 194 -  HDL >39 mg/dL - - - 45 -  Calc LDL 0 - 99 mg/dL - - - 97 -  Triglycerides 0 - 149 mg/dL - - - 261(H) -  Creatinine 0.76 - 1.27 mg/dL 0.84 0.89 0.78 - 0.72  GFR >60.00 mL/min - - - - -   BP/Weight 12/10/2019 09/24/2019 07/24/2019 05/08/2019 [id number]  Systolic BP Q000111Q 0000000 [id number]  Diastolic BP 76 68 75 78 78  Wt. (Lbs) 244.12 240 240 240 226.6  BMI 33.11 32.55 32.55 32.55 30.73   Foot/eye exam completion dates 02/19/2018  Foot Form Completion Done    Jeremy Tucker  reports that he has never smoked. He has never used smokeless tobacco. He reports current alcohol use. He reports that he does not use drugs.     Assessment & Plan:    Controlled type 2 diabetes mellitus with complication, without long-term current use of insulin ([place]) - Plan: POCT glycosylated hemoglobin (Hb A1C), POCT UA - Microalbumin, CBC with Differential/Platelet, Lipid panel  Peroneal neuropathy at knee, right  Paroxysmal atrial fibrillation (HCC) - Plan: diltiazem (TIAZAC) 180 MG 24 hr capsule  OSA (obstructive sleep apnea)  Obesity (BMI 30-39.9)  Hypertension associated with diabetes ([place]) - Plan: CBC with Differential/Platelet  Hyperlipidemia associated with type 2 diabetes mellitus ([place]) - Plan: Lipid panel, atorvastatin (LIPITOR) 20 MG tablet  History of  renal stone - Plan: allopurinol (ZYLOPRIM) 300 MG tablet  Gastroesophageal reflux disease without esophagitis  Chronic anticoagulation  Mild persistent asthma with acute exacerbation  Aortic stenosis, moderate - Plan: metoprolol succinate (TOPROL-XL) 25 MG 24 hr tablet  Arthritis  Anxiety and depression - Plan: buPROPion (WELLBUTRIN XL) 300 MG 24 hr tablet, sertraline (ZOLOFT) 50 MG tablet   1. Rx changes: none 2. Education: Reviewed 'ABCs' of diabetes management (respective goals in parentheses):  A1C (<7), blood pressure (<130/80), and cholesterol (LDL  <100). 3. Compliance at present is estimated to be good. Efforts to improve compliance (if necessary) will be directed at increased exercise. 4. Follow up: 4 months Greater than 45 minutes spent discussing all these issues with him

## 2020-01-29 LAB — LIPID PANEL
Chol/HDL Ratio: 4.4 ratio (ref 0.0–5.0)
Cholesterol, Total: 195 mg/dL (ref 100–199)
HDL: 44 mg/dL (ref >39)
LDL Chol Calc (NIH): 112 mg/dL — ABNORMAL HIGH (ref 0–99)
Triglycerides: 223 mg/dL — ABNORMAL HIGH (ref 0–149)
VLDL Cholesterol Cal: 39 mg/dL (ref 5–40)

## 2020-01-29 LAB — CBC WITH DIFFERENTIAL/PLATELET
Basophils Absolute: 0.1 10*3/uL (ref 0.0–0.2)
Basos: 1 %
EOS (ABSOLUTE): 0.2 10*3/uL (ref 0.0–0.4)
Eos: 2 %
Hematocrit: 45.5 % (ref 37.5–51.0)
Hemoglobin: 15.4 g/dL (ref 13.0–17.7)
Immature Grans (Abs): 0.1 10*3/uL (ref 0.0–0.1)
Immature Granulocytes: 1 %
Lymphocytes Absolute: 1.7 10*3/uL (ref 0.7–3.1)
Lymphs: 17 %
MCH: 32 pg (ref 26.6–33.0)
MCHC: 33.8 g/dL (ref 31.5–35.7)
MCV: 94 fL (ref 79–97)
Monocytes Absolute: 0.7 10*3/uL (ref 0.1–0.9)
Monocytes: 8 %
Neutrophils Absolute: 6.9 10*3/uL (ref 1.4–7.0)
Neutrophils: 71 %
Platelets: 244 10*3/uL (ref 150–450)
RBC: 4.82 x10E6/uL (ref 4.14–5.80)
RDW: 12.3 % (ref 11.6–15.4)
WBC: 9.5 10*3/uL (ref 3.4–10.8)

## 2020-01-29 MED ORDER — ATORVASTATIN CALCIUM 40 MG PO TABS: 40.0000 mg | ORAL_TABLET | Freq: Every day | ORAL | 3 refills | Status: DC

## 2020-01-29 NOTE — Addendum Note (Signed)
Addended by: Denita Lung on: 01/29/2020 09:17 AM   Modules accepted: Orders

## 2020-01-31 DIAGNOSIS — C61 Malignant neoplasm of prostate: Secondary | ICD-10-CM | POA: Insufficient documentation

## 2020-02-04 ENCOUNTER — Encounter: Payer: Self-pay | Admitting: Family Medicine

## 2020-02-20 DIAGNOSIS — C61 Malignant neoplasm of prostate: Secondary | ICD-10-CM | POA: Diagnosis not present

## 2020-03-30 DIAGNOSIS — N201 Calculus of ureter: Secondary | ICD-10-CM | POA: Diagnosis not present

## 2020-03-30 DIAGNOSIS — N132 Hydronephrosis with renal and ureteral calculous obstruction: Secondary | ICD-10-CM | POA: Diagnosis not present

## 2020-03-31 ENCOUNTER — Telehealth: Payer: Self-pay | Admitting: Interventional Cardiology

## 2020-03-31 ENCOUNTER — Other Ambulatory Visit (HOSPITAL_COMMUNITY)
Admission: RE | Admit: 2020-03-31 | Discharge: 2020-03-31 | Disposition: A | Discharge status: Home / Self Care | Payer: PPO | Source: Ambulatory Visit | Attending: Urology | Admitting: Urology

## 2020-03-31 ENCOUNTER — Other Ambulatory Visit: Payer: Self-pay | Admitting: Urology

## 2020-03-31 DIAGNOSIS — N200 Calculus of kidney: Secondary | ICD-10-CM

## 2020-03-31 DIAGNOSIS — Z01812 Encounter for preprocedural laboratory examination: Secondary | ICD-10-CM | POA: Diagnosis not present

## 2020-03-31 DIAGNOSIS — Z20822 Contact with and (suspected) exposure to covid-19: Secondary | ICD-10-CM | POA: Insufficient documentation

## 2020-03-31 LAB — SARS CORONAVIRUS 2 (TAT 6-24 HRS): SARS Coronavirus 2: NEGATIVE

## 2020-03-31 NOTE — Telephone Encounter (Signed)
   [facility] Pre-operative Risk Assessment    HEARTCARE STAFF: - Please ensure there is not already an duplicate clearance open for this procedure - Under Visit Info/Reason for Call, type in Other and utilize the format Clearance MM/DD/YY or Clearance TBD  Request for surgical clearance:  1. What type of surgery is being performed? Lithotripsy  2. When is this surgery scheduled? 04/02/20   3. What type of clearance is required (medical clearance vs. Pharmacy clearance to hold med vs. Both)? medical  4. Are there any medications that need to be held prior to surgery and how long? n/a  5. Practice name and name of physician performing surgery? Dr. Nicolette Bang at [facility]  6. What is the office phone number? [phone] ext [phone]   7.   What is the office fax number? [phone]  8.   Anesthesia type (None, local, MAC, general) ? Local    Jeremy Tucker 03/31/2020, 3:23 PM  _________________________________________________________________   (provider comments below)

## 2020-04-01 NOTE — Telephone Encounter (Signed)
Pharm team  Spoke with urology office for procedure that is scheduled for tomorrow. They are ok with proceeding with him not holding however, if he could hold this evening she states that would be ideal. Please send your recs back and ill send to their pre-procedure team ASAP.    Thanks  [employer]

## 2020-04-01 NOTE — Telephone Encounter (Addendum)
Patient with diagnosis of afib on Eliquis for anticoagulation.    Procedure: lithotripsy Date of procedure: 04/02/20  CHADS2-VASc score of 4 (age, CHF, HTN, DM, CAD noted on chest CT 2019). Also has history of single provoked DVT post knee surgery.  CrCl >165mL/min Platelet count 244k  Ok to hold Eliquis tonight for procedure tomorrow AM.

## 2020-04-01 NOTE — Progress Notes (Signed)
Patient to arrive at [facility] on 04/02/20. Instructed to hold eliquis this evening and in am pre-procedure per cardiology and pharmacy notes.  NPO after midnight. Will take am medications with sip of water. Pre-procedure instructions given. Driver secured.

## 2020-04-02 ENCOUNTER — Encounter (HOSPITAL_BASED_OUTPATIENT_CLINIC_OR_DEPARTMENT_OTHER): Payer: Self-pay | Admitting: Urology

## 2020-04-02 ENCOUNTER — Ambulatory Visit (HOSPITAL_COMMUNITY): Payer: PPO

## 2020-04-02 ENCOUNTER — Encounter (HOSPITAL_BASED_OUTPATIENT_CLINIC_OR_DEPARTMENT_OTHER)
Admission: RE | Disposition: A | Discharge status: Home / Self Care | Payer: Self-pay | Source: Home / Self Care | Attending: Urology

## 2020-04-02 ENCOUNTER — Other Ambulatory Visit: Payer: Self-pay

## 2020-04-02 ENCOUNTER — Other Ambulatory Visit: Payer: Self-pay | Admitting: Urology

## 2020-04-02 ENCOUNTER — Ambulatory Visit (HOSPITAL_BASED_OUTPATIENT_CLINIC_OR_DEPARTMENT_OTHER)
Admission: RE | Admit: 2020-04-02 | Discharge: 2020-04-02 | Disposition: A | Discharge status: Home / Self Care | Payer: PPO | Attending: Urology | Admitting: Urology

## 2020-04-02 DIAGNOSIS — J4530 Mild persistent asthma, uncomplicated: Secondary | ICD-10-CM | POA: Diagnosis not present

## 2020-04-02 DIAGNOSIS — Z7901 Long term (current) use of anticoagulants: Secondary | ICD-10-CM | POA: Insufficient documentation

## 2020-04-02 DIAGNOSIS — N201 Calculus of ureter: Secondary | ICD-10-CM

## 2020-04-02 DIAGNOSIS — E119 Type 2 diabetes mellitus without complications: Secondary | ICD-10-CM | POA: Diagnosis not present

## 2020-04-02 DIAGNOSIS — N200 Calculus of kidney: Secondary | ICD-10-CM | POA: Diagnosis not present

## 2020-04-02 DIAGNOSIS — I10 Essential (primary) hypertension: Secondary | ICD-10-CM | POA: Insufficient documentation

## 2020-04-02 DIAGNOSIS — K219 Gastro-esophageal reflux disease without esophagitis: Secondary | ICD-10-CM | POA: Diagnosis not present

## 2020-04-02 DIAGNOSIS — G4733 Obstructive sleep apnea (adult) (pediatric): Secondary | ICD-10-CM | POA: Diagnosis not present

## 2020-04-02 DIAGNOSIS — F329 Major depressive disorder, single episode, unspecified: Secondary | ICD-10-CM | POA: Diagnosis not present

## 2020-04-02 DIAGNOSIS — Z79899 Other long term (current) drug therapy: Secondary | ICD-10-CM | POA: Insufficient documentation

## 2020-04-02 DIAGNOSIS — F419 Anxiety disorder, unspecified: Secondary | ICD-10-CM | POA: Diagnosis not present

## 2020-04-02 DIAGNOSIS — I480 Paroxysmal atrial fibrillation: Secondary | ICD-10-CM | POA: Diagnosis not present

## 2020-04-02 HISTORY — PX: EXTRACORPOREAL SHOCK WAVE LITHOTRIPSY: SHX1557

## 2020-04-02 SURGERY — LITHOTRIPSY, ESWL
Anesthesia: LOCAL | Laterality: Bilateral

## 2020-04-02 MED ORDER — DIAZEPAM 5 MG PO TABS
ORAL_TABLET | ORAL | Status: AC
  Filled 2020-04-02: qty 2

## 2020-04-02 MED ORDER — DIPHENHYDRAMINE HCL 25 MG PO CAPS
ORAL_CAPSULE | ORAL | Status: AC
  Filled 2020-04-02: qty 1

## 2020-04-02 MED ORDER — HYDROMORPHONE HCL 2 MG PO TABS: 2.0000 mg | ORAL_TABLET | ORAL | 0 refills | Status: AC | PRN

## 2020-04-02 MED ORDER — DIPHENHYDRAMINE HCL 25 MG PO CAPS
25.0000 mg | ORAL_CAPSULE | ORAL | Status: AC
  Administered 2020-04-02: 25 mg via ORAL

## 2020-04-02 MED ORDER — CIPROFLOXACIN HCL 500 MG PO TABS
ORAL_TABLET | ORAL | Status: AC
  Filled 2020-04-02: qty 1

## 2020-04-02 MED ORDER — DIAZEPAM 5 MG PO TABS
10.0000 mg | ORAL_TABLET | ORAL | Status: AC
  Administered 2020-04-02: 10 mg via ORAL

## 2020-04-02 MED ORDER — CIPROFLOXACIN HCL 500 MG PO TABS
500.0000 mg | ORAL_TABLET | ORAL | Status: AC
  Administered 2020-04-02: 500 mg via ORAL

## 2020-04-02 MED ORDER — SODIUM CHLORIDE 0.9 % IV SOLN: INTRAVENOUS | Status: DC

## 2020-04-02 NOTE — Progress Notes (Signed)
Patient rescheduled for 04/06/20. Instructed to resume eliquis this evening and stop again after evening dose on 01/05/20. To arrive at 6:00am. NPO after midnight, am medications with sip of water in water.

## 2020-04-03 ENCOUNTER — Other Ambulatory Visit (HOSPITAL_COMMUNITY)
Admission: RE | Admit: 2020-04-03 | Discharge: 2020-04-03 | Disposition: A | Discharge status: Home / Self Care | Payer: PPO | Source: Ambulatory Visit | Attending: Urology | Admitting: Urology

## 2020-04-03 DIAGNOSIS — Z20822 Contact with and (suspected) exposure to covid-19: Secondary | ICD-10-CM | POA: Insufficient documentation

## 2020-04-03 DIAGNOSIS — Z01812 Encounter for preprocedural laboratory examination: Secondary | ICD-10-CM | POA: Insufficient documentation

## 2020-04-03 LAB — SARS CORONAVIRUS 2 (TAT 6-24 HRS): SARS Coronavirus 2: NEGATIVE

## 2020-04-04 ENCOUNTER — Other Ambulatory Visit (HOSPITAL_COMMUNITY): Payer: PPO

## 2020-04-06 ENCOUNTER — Ambulatory Visit (HOSPITAL_COMMUNITY): Payer: PPO

## 2020-04-06 ENCOUNTER — Encounter (HOSPITAL_BASED_OUTPATIENT_CLINIC_OR_DEPARTMENT_OTHER): Payer: Self-pay | Admitting: Urology

## 2020-04-06 ENCOUNTER — Ambulatory Visit (HOSPITAL_BASED_OUTPATIENT_CLINIC_OR_DEPARTMENT_OTHER)
Admission: RE | Admit: 2020-04-06 | Discharge: 2020-04-06 | Disposition: A | Discharge status: Home / Self Care | Payer: PPO | Attending: Urology | Admitting: Urology

## 2020-04-06 ENCOUNTER — Encounter (HOSPITAL_BASED_OUTPATIENT_CLINIC_OR_DEPARTMENT_OTHER)
Admission: RE | Disposition: A | Discharge status: Home / Self Care | Payer: Self-pay | Source: Home / Self Care | Attending: Urology

## 2020-04-06 DIAGNOSIS — I4891 Unspecified atrial fibrillation: Secondary | ICD-10-CM | POA: Diagnosis not present

## 2020-04-06 DIAGNOSIS — Z7901 Long term (current) use of anticoagulants: Secondary | ICD-10-CM | POA: Diagnosis not present

## 2020-04-06 DIAGNOSIS — Z885 Allergy status to narcotic agent status: Secondary | ICD-10-CM | POA: Diagnosis not present

## 2020-04-06 DIAGNOSIS — Z792 Long term (current) use of antibiotics: Secondary | ICD-10-CM | POA: Diagnosis not present

## 2020-04-06 DIAGNOSIS — E669 Obesity, unspecified: Secondary | ICD-10-CM | POA: Insufficient documentation

## 2020-04-06 DIAGNOSIS — N201 Calculus of ureter: Secondary | ICD-10-CM | POA: Diagnosis not present

## 2020-04-06 DIAGNOSIS — Z79899 Other long term (current) drug therapy: Secondary | ICD-10-CM | POA: Diagnosis not present

## 2020-04-06 DIAGNOSIS — Z01818 Encounter for other preprocedural examination: Secondary | ICD-10-CM | POA: Diagnosis not present

## 2020-04-06 DIAGNOSIS — I209 Angina pectoris, unspecified: Secondary | ICD-10-CM | POA: Insufficient documentation

## 2020-04-06 DIAGNOSIS — J45909 Unspecified asthma, uncomplicated: Secondary | ICD-10-CM | POA: Insufficient documentation

## 2020-04-06 DIAGNOSIS — Z7951 Long term (current) use of inhaled steroids: Secondary | ICD-10-CM | POA: Insufficient documentation

## 2020-04-06 DIAGNOSIS — N200 Calculus of kidney: Secondary | ICD-10-CM | POA: Diagnosis not present

## 2020-04-06 DIAGNOSIS — N202 Calculus of kidney with calculus of ureter: Secondary | ICD-10-CM | POA: Insufficient documentation

## 2020-04-06 HISTORY — PX: EXTRACORPOREAL SHOCK WAVE LITHOTRIPSY: SHX1557

## 2020-04-06 SURGERY — LITHOTRIPSY, ESWL
Anesthesia: LOCAL | Laterality: Left

## 2020-04-06 MED ORDER — SODIUM CHLORIDE 0.9 % IV SOLN: INTRAVENOUS | Status: DC

## 2020-04-06 MED ORDER — DIPHENHYDRAMINE HCL 25 MG PO CAPS
25.0000 mg | ORAL_CAPSULE | ORAL | Status: AC
  Administered 2020-04-06: 25 mg via ORAL

## 2020-04-06 MED ORDER — DIAZEPAM 5 MG PO TABS
ORAL_TABLET | ORAL | Status: AC
  Filled 2020-04-06: qty 2

## 2020-04-06 MED ORDER — APIXABAN 5 MG PO TABS: 5.0000 mg | ORAL_TABLET | Freq: Two times a day (BID) | ORAL | 1 refills | Status: DC

## 2020-04-06 MED ORDER — CIPROFLOXACIN HCL 500 MG PO TABS
500.0000 mg | ORAL_TABLET | ORAL | Status: AC
  Administered 2020-04-06: 500 mg via ORAL

## 2020-04-06 MED ORDER — DIAZEPAM 5 MG PO TABS
10.0000 mg | ORAL_TABLET | ORAL | Status: AC
  Administered 2020-04-06: 10 mg via ORAL

## 2020-04-06 MED ORDER — DIPHENHYDRAMINE HCL 25 MG PO CAPS
ORAL_CAPSULE | ORAL | Status: AC
  Filled 2020-04-06: qty 1

## 2020-04-06 MED ORDER — HYDROMORPHONE HCL 2 MG PO TABS: 2.0000 mg | ORAL_TABLET | ORAL | Status: DC | PRN

## 2020-04-06 MED ORDER — CIPROFLOXACIN HCL 500 MG PO TABS
ORAL_TABLET | ORAL | Status: AC
  Filled 2020-04-06: qty 1

## 2020-04-06 NOTE — Op Note (Signed)
See [facility] OP note scanned into chart. Also because of the size, density, location and other factors that cannot be anticipated I feel this will likely be a staged procedure. This fact supersedes any indication in the scanned [facility] operative note to the contrary.  

## 2020-04-06 NOTE — Interval H&P Note (Signed)
History and Physical Interval Note:  04/06/2020 7:53 AM  Jeremy Tucker  has presented today for surgery, with the diagnosis of LEFT URETERAL STONE.  The various methods of treatment have been discussed with the patient and family. After consideration of risks, benefits and other options for treatment, the patient has consented to  bilateral distal extracorporal shockwave lithotripsy as a surgical intervention.  The patient's history has been reviewed, patient examined, no change in status, stable for surgery.  I have reviewed the patient's chart and labs.  Questions were answered to the patient's satisfaction.     [name]

## 2020-04-06 NOTE — H&P (Signed)
03/30/20: Jeremy Tucker was a walk in today for severe left sided pain. He reports a sudden onset associated with difficulty voiding that began early this AM. He has a history of stone and his last stone event was 3 years ago. He is currently on allopurinol and tamsulsoin. He reports nausea and 10/10 intermittent left flank pain. He denies fevers, chills and gross hematuria.     ALLERGIES: OxyCODONE HCl TABS    MEDICATIONS: Allopurinol 300 mg tablet TAKE ONE TABLET BY MOUTH DAILY  Allopurinol 300 mg tablet TAKE ONE TABLET BY MOUTH DAILY  Tamsulosin Hcl 0.4 mg capsule TAKE 1 CAPSULE BY MOUTH DAILY  Asmanex Hfa 200 mcg/actuation hfa aerosol with adapter Inhalation  Cardizem Cd 180 mg capsule, ext release 24 hr Oral  Eliquis 5 MG Oral Tablet BID  Fluoxetine Hcl  Levofloxacin 750 mg tablet 1 tablet PO Morning of procedure  Loratadine 10 mg tablet PRN  Multiple Vitamins tablet Oral  Omega-3 300 mg-1,000 mg capsule Oral  Omeprazole 20 mg capsule,delayed release Oral  Proair Hfa 90 mcg hfa aerosol with adapter Inhalation  Tylenol Extra Strength 500 mg tablet 2 PO Q AM  Wellbutrin Xl 150 mg tablet, extended release 24 hr Oral  Zolpidem Tartrate 10 mg tablet Oral     GU PSH: Cysto Uretero Lithotripsy - 2016 Cystoscopy Insert Stent - 2016 Initial Male VB Sounds - 2016 Prostate Needle Biopsy - 01/24/2020       PSH Notes: Repair Of Esophageal Stricture, Knee Surgery Left, Kidney Surgery   NON-GU PSH: Anesth, Hip Joint Surgery, plate/screws after falls Heart Surgery (Unspecified), ablation Surgical Pathology, Gross And Microscopic Examination For Prostate Needle - 01/24/2020     GU PMH: Elevated PSA - 01/24/2020, PSA continues to increase on recheck - now up to 6.69 from his baseline of ~4. His percent free PSA was 13, which would indicate that his elevation is more likely due to possible cancer. We will move forward with TRUSP bx. , - 12/04/2019 BPH w/LUTS, No new urinary complaints since last  visit. - 12/04/2019, (Stable), Reports general satisfaction with moderate symptoms. Prostate shows no indications of cancer -- will continue to monitor. , - 05/20/2019 (Stable), Urinary symptoms mild on tamsulosin. Digital rectal exam normal, - 2019, - 2018, Benign localized hyperplasia of prostate with urinary obstruction, - 2015 ED due to arterial insufficiency (Improving), Patient satisfied with sildenafil use -- occasionally does not require medication. - 05/20/2019, (Stable), He does well on sildenafil, - 2019, - 2018, Erectile dysfunction due to arterial insufficiency, - 2014 Renal calculus (Improving), Denies any recent stones. Advised to increase fluids and decrease sodium and animal proteins. - 05/20/2019, (Stable), Bilateral 9 mm. calculi, the largest of which is just under 9 mm., - 2019, - 2018, Nephrolithiasis, - 2016 Urinary Urgency - [id number] Renal colic, Renal colic on left side - Q000111Q Urinary Frequency, Urinary frequency - 2015      PMH Notes:  2006-12-28 13:20:14 - Note: Venous Thrombosis   dropped foot   NON-GU PMH: Encounter for general adult medical examination without abnormal findings, Encounter for preventive health examination - 2016 Personal history of other diseases of the circulatory system, History of atrial fibrillation - 2016 Decreased libido, Decreased libido - 2014 Personal history of other diseases of the digestive system, History of esophageal reflux - 2014 Personal history of other endocrine, nutritional and metabolic disease, History of hypercholesterolemia - 2014 Atrial Fibrillation    FAMILY HISTORY: Son 1-1 Deceased - Son   SOCIAL HISTORY: Marital Status:  Married Preferred Language: English; Ethnicity: Not Hispanic Or Latino; Race: White Current Smoking Status: Patient has never smoked.   Tobacco Use Assessment Completed: Used Tobacco in last 30 days? Does drink.  Does not drink caffeine. Patient's occupation is/was Retired.     Notes: 2 sons   REVIEW OF  SYSTEMS:    GU Review Male:   Patient denies frequent urination, hard to postpone urination, burning/ pain with urination, get up at night to urinate, leakage of urine, stream starts and stops, trouble starting your stream, have to strain to urinate , erection problems, and penile pain.  Gastrointestinal (Upper):   Patient reports nausea. Patient denies vomiting and indigestion/ heartburn.  Gastrointestinal (Lower):   Patient denies diarrhea and constipation.  Constitutional:   Patient denies fever, night sweats, weight loss, and fatigue.  Skin:   Patient denies skin rash/ lesion and itching.  Eyes:   Patient denies blurred vision and double vision.  Ears/ Nose/ Throat:   Patient denies sore throat and sinus problems.  Hematologic/Lymphatic:   Patient denies swollen glands and easy bruising.  Cardiovascular:   Patient denies leg swelling and chest pains.  Respiratory:   Patient denies cough and shortness of breath.  Endocrine:   Patient denies excessive thirst.  Musculoskeletal:   Patient denies back pain and joint pain.  Neurological:   Patient denies headaches and dizziness.  Psychologic:   Patient denies depression and anxiety.   Notes: left flank pain    VITAL SIGNS:      03/30/2020 11:31 AM  Weight 240 lb / 108.86 kg  Height 72 in / 182.88 cm  BP 158/79 mmHg  Pulse 69 /min  Temperature 96.9 F / 36.0 C  BMI 32.5 kg/m   MULTI-SYSTEM PHYSICAL EXAMINATION:    Constitutional: Well-nourished. No physical deformities. Normally developed. Good grooming. Patient pacing room, asking for a pain shot, visibly uncomfortable  Respiratory: No labored breathing, no use of accessory muscles.   Cardiovascular: Normal temperature, normal extremity pulses, no swelling, no varicosities.  Skin: No paleness, no jaundice, no cyanosis. No lesion, no ulcer, no rash.  Neurologic / Psychiatric: Oriented to time, oriented to place, oriented to person. No depression, no anxiety, no agitation.   Gastrointestinal: No mass, no tenderness, no rigidity, non obese abdomen. LEft flank tenderness  Musculoskeletal: Normal gait and station of head and neck.     Complexity of Data:  Source Of History:  Patient, Medical Record Summary  Records Review:   Previous Doctor Records, Previous Patient Records  Urine Test Review:   Urinalysis, Urine Culture  X-Ray Review: C.T. Abdomen/Pelvis: Reviewed Films.     11/27/19 05/20/19 07/19/18 01/12/18 01/31/17 04/08/15 09/23/14 09/25/13  PSA  Total PSA 6.69 ng/mL 4.49 ng/mL 4.18 ng/mL 4.33 ng/mL 3.94 ng/dl 3.87  3.55  2.68   Free PSA 0.87 ng/mL  0.91 ng/mL       % Free PSA 13 % PSA  22 % PSA         03/25/11  Hormones  Testosterone, Total 480.07     PROCEDURES:         C.T. Urogram - [id number]      Patient confirmed No Neulasta OnPro Device.          KUB - [id number]  A single view of the abdomen is obtained.      Patient confirmed No Neulasta OnPro Device.            Urinalysis w/Scope Micro  WBC/hpf: 0 - 5/hpf  RBC/hpf: >60/hpf  Bacteria: NS (Not Seen)  Cystals: NS (Not Seen)  Casts: NS (Not Seen)  Trichomonas: Not Present  Mucous: Present  Epithelial Cells: NS (Not Seen)  Yeast: NS (Not Seen)  Sperm: Not Present         Morphine 4mg  - [id number], [id number] Qty: 4 Adm. By: Alleen Borne McDougald  Unit: mg Lot No [id number]  Route: IM Exp. Date 04/14/2021  Freq: None Mfgr.:   Site: Left Buttock         Phenergan 25mg  - [id number], [id number] Qty: 25 Adm. By: Pricilla Riffle  Unit: mg Lot No [id number]  Route: IM Exp. Date 07/15/2021  Freq: None Mfgr.:   Site: Left Hip   ASSESSMENT:      ICD-10 Details  1 GU:   Ureteral calculus - N20.1 Undiagnosed New Problem   PLAN:            Medications New Meds: Hydromorphone Hcl 2 mg tablet 1-2 tablet PO Q 6 H PRN For Pain  #20  0 Refill(s)  Zofran 4 mg tablet 1 tablet PO Q 6 H   #10  0 Refill(s)            Orders Labs Urinalysis, CULTURE, URINE  X-Rays: C[facility]. Stone Protocol Without Contrast    KUB   X-Ray Notes: History:   Hematuria: Yes/No  Patient to see MD after exam: Yes/No  Previous exam: CT / IVP/ US/ KUB/ None  When:  Where:  Diabetic: Yes/ No  High Blood Pressure: Yes/ No  BUN/ Creatine:  Date of last BUN Creatinine:  Weight in pounds:  Allergy- Contrasts/ Shellfish: Yes/ No  Conflicting diabetic meds: Yes/ No  Oral contrast and instructions given to patient:   Prior Authorization #: no auth required             Schedule Return Visit/Planned Activity: Next Available Appointment - Schedule Surgery  Procedure: 03/30/2020 at [facility] - [phone] - Morphine 8mg  (Ther/Proph/Diag Inj, [place]/Im) [id number], [id number] Notes: Please give only 4mg , for a total of 8mg , he had 4mg  earlier  Procedure: 03/30/2020 at [facility] - [phone] - Morphine 4mg  (Ther/Proph/Diag Inj, [place]/Im) [id number], [id number]  Procedure: 03/30/2020 at [facility] - [phone] - Phenergan 25mg  (Phenergan Per 50 Mg) - [id number], [id number]          Document Letter(s):  Created for Patient: Clinical Summary         Notes:   Urinalysis sent for precautionary culture. CT concerning for a left sided distal 28mm obstructing stone and a right sided non-obstructing distal ureteral stone. I discussed his case with his urologist who believes the patient could undergo a ESWL procedure as his stones are well visualized on KUB. The patient is agreeable to trying this first. He was given narcotic pain medication prescription as well as a short course of nausea medication if needed. He is unable to tolerate NSAIDS due to taking Eliquis for AFIB. I advised very strict return precautions in the interval. He voiced understanding.

## 2020-04-06 NOTE — Discharge Instructions (Signed)
Lithotripsy, Care After This sheet gives you information about how to care for yourself after your procedure. Your health care provider may also give you more specific instructions. If you have problems or questions, contact your health care provider. What can I expect after the procedure? After the procedure, it is common to have:  Some blood in your urine. This should only last for a few days.  Soreness in your back, sides, or upper abdomen for a few days.  Blotches or bruises on your back where the pressure wave entered the skin.  Pain, discomfort, or nausea when pieces (fragments) of the kidney stone move through the tube that carries urine from the kidney to the bladder (ureter). Stone fragments may pass soon after the procedure, but they may continue to pass for up to 4-8 weeks. ? If you have severe pain or nausea, contact your health care provider. This may be caused by a large stone that was not broken up, and this may mean that you need more treatment.  Some pain or discomfort during urination.  Some pain or discomfort in the lower abdomen or (in men) at the base of the penis. Follow these instructions at home: Medicines  Take over-the-counter and prescription medicines only as told by your health care provider.  If you were prescribed an antibiotic medicine, take it as told by your health care provider. Do not stop taking the antibiotic even if you start to feel better.  Do not drive for 24 hours if you were given a medicine to help you relax (sedative).  Do not drive or use heavy machinery while taking prescription pain medicine. Eating and drinking      Drink enough water and fluids to keep your urine clear or pale yellow. This helps any remaining pieces of the stone to pass. It can also help prevent new stones from forming.  Eat plenty of fresh fruits and vegetables.  Follow instructions from your health care provider about eating and drinking restrictions. You may be  instructed: ? To reduce how much salt (sodium) you eat or drink. Check ingredients and nutrition facts on packaged foods and beverages. ? To reduce how much meat you eat.  Eat the recommended amount of calcium for your age and gender. Ask your health care provider how much calcium you should have. General instructions  Get plenty of rest.  Most people can resume normal activities 1-2 days after the procedure. Ask your health care provider what activities are safe for you.  Your health care provider may direct you to lie in a certain position (postural drainage) and tap firmly (percuss) over your kidney area to help stone fragments pass. Follow instructions as told by your health care provider.  If directed, strain all urine through the strainer that was provided by your health care provider. ? Keep all fragments for your health care provider to see. Any stones that are found may be sent to a medical lab for examination. The stone may be as small as a grain of salt.  Keep all follow-up visits as told by your health care provider. This is important. Contact a health care provider if:  You have pain that is severe or does not get better with medicine.  You have nausea that is severe or does not go away.  You have blood in your urine longer than your health care provider told you to expect.  You have more blood in your urine.  You have pain during urination that does   not go away.  You urinate more frequently than usual and this does not go away.  You develop a rash or any other possible signs of an allergic reaction. Get help right away if:  You have severe pain in your back, sides, or upper abdomen.  You have severe pain while urinating.  Your urine is very dark red.  You have blood in your stool (feces).  You cannot pass any urine at all.  You feel a strong urge to urinate after emptying your bladder.  You have a fever or chills.  You develop shortness of breath,  difficulty breathing, or chest pain.  You have severe nausea that leads to persistent vomiting.  You faint. Summary  After this procedure, it is common to have some pain, discomfort, or nausea when pieces (fragments) of the kidney stone move through the tube that carries urine from the kidney to the bladder (ureter). If this pain or nausea is severe, however, you should contact your health care provider.  Most people can resume normal activities 1-2 days after the procedure. Ask your health care provider what activities are safe for you.  Drink enough water and fluids to keep your urine clear or pale yellow. This helps any remaining pieces of the stone to pass, and it can help prevent new stones from forming.  If directed, strain your urine and keep all fragments for your health care provider to see. Fragments or stones may be as small as a grain of salt.  Get help right away if you have severe pain in your back, sides, or upper abdomen or have severe pain while urinating. This information is not intended to replace advice given to you by your health care provider. Make sure you discuss any questions you have with your health care provider. Document Revised: 02/11/2019 Document Reviewed: 09/21/2016 Elsevier Patient Education  2020 [employer]. See [facility] discharge instructions in chart. 

## 2020-04-20 DIAGNOSIS — N201 Calculus of ureter: Secondary | ICD-10-CM | POA: Diagnosis not present

## 2020-04-28 ENCOUNTER — Other Ambulatory Visit: Payer: Self-pay | Admitting: Otolaryngology

## 2020-04-28 DIAGNOSIS — J339 Nasal polyp, unspecified: Secondary | ICD-10-CM

## 2020-05-06 DIAGNOSIS — L249 Irritant contact dermatitis, unspecified cause: Secondary | ICD-10-CM | POA: Diagnosis not present

## 2020-05-06 DIAGNOSIS — D1801 Hemangioma of skin and subcutaneous tissue: Secondary | ICD-10-CM | POA: Diagnosis not present

## 2020-05-06 DIAGNOSIS — D225 Melanocytic nevi of trunk: Secondary | ICD-10-CM | POA: Diagnosis not present

## 2020-05-06 DIAGNOSIS — L300 Nummular dermatitis: Secondary | ICD-10-CM | POA: Diagnosis not present

## 2020-05-06 DIAGNOSIS — Z85828 Personal history of other malignant neoplasm of skin: Secondary | ICD-10-CM | POA: Diagnosis not present

## 2020-05-06 DIAGNOSIS — D2362 Other benign neoplasm of skin of left upper limb, including shoulder: Secondary | ICD-10-CM | POA: Diagnosis not present

## 2020-05-06 DIAGNOSIS — D485 Neoplasm of uncertain behavior of skin: Secondary | ICD-10-CM | POA: Diagnosis not present

## 2020-05-06 DIAGNOSIS — L570 Actinic keratosis: Secondary | ICD-10-CM | POA: Diagnosis not present

## 2020-05-06 DIAGNOSIS — L821 Other seborrheic keratosis: Secondary | ICD-10-CM | POA: Diagnosis not present

## 2020-05-06 DIAGNOSIS — L905 Scar conditions and fibrosis of skin: Secondary | ICD-10-CM | POA: Diagnosis not present

## 2020-05-06 HISTORY — PX: SKIN BIOPSY: SHX1

## 2020-05-15 ENCOUNTER — Encounter: Payer: Self-pay | Admitting: Family Medicine

## 2020-06-08 DIAGNOSIS — R972 Elevated prostate specific antigen [PSA]: Secondary | ICD-10-CM | POA: Diagnosis not present

## 2020-06-10 DIAGNOSIS — N200 Calculus of kidney: Secondary | ICD-10-CM | POA: Diagnosis not present

## 2020-06-19 ENCOUNTER — Other Ambulatory Visit: Payer: Self-pay | Admitting: Interventional Cardiology

## 2020-06-19 ENCOUNTER — Other Ambulatory Visit: Payer: Self-pay | Admitting: Family Medicine

## 2020-06-19 DIAGNOSIS — F419 Anxiety disorder, unspecified: Secondary | ICD-10-CM

## 2020-06-19 DIAGNOSIS — F32A Depression, unspecified: Secondary | ICD-10-CM

## 2020-06-19 NOTE — Telephone Encounter (Signed)
Age 69, weight 106kg, SCr 0.84 on 12/10/19, last visit Jan 2021, afib indication

## 2020-06-19 NOTE — Telephone Encounter (Signed)
[facility] is requesting to fill pt zoloft. Please advise [facility]

## 2020-06-30 ENCOUNTER — Telehealth: Payer: Self-pay | Admitting: *Deleted

## 2020-06-30 NOTE — Telephone Encounter (Signed)
   [facility] Pre-operative Risk Assessment    HEARTCARE STAFF: - Please ensure there is not already an duplicate clearance open for this procedure. - Under Visit Info/Reason for Call, type in Other and utilize the format Clearance MM/DD/YY or Clearance TBD. Do not use dashes or single digits. - If request is for dental extraction, please clarify the # of teeth to be extracted.  Request for surgical clearance:  1. What type of surgery is being performed? PROSTATE Bx   2. When is this surgery scheduled? TBD   3. What type of clearance is required (medical clearance vs. Pharmacy clearance to hold med vs. Both)? BOTH  4. Are there any medications that need to be held prior to surgery and how long? ELIQUIS   5. Practice name and name of physician performing surgery? [facility]; DR. Diona Fanti   6. What is the office phone number? [phone]   7.   What is the office fax number? [phone]  8.   Anesthesia type (None, local, MAC, general) ? NOT LISTED   Julaine Hua 06/30/2020, 11:09 AM  _________________________________________________________________   (provider comments below)

## 2020-06-30 NOTE — Telephone Encounter (Signed)
   Primary Cardiologist:Henry Nicholes Stairs III, MD  Chart reviewed as part of pre-operative protocol coverage. Because of TAICHI REPKA past medical history and time since last visit, they will require a follow-up visit in order to better assess preoperative cardiovascular risk.  Pre-op covering staff: - Please schedule appointment and call patient to inform them.  - Please add "pre-op clearance" to the appointment notes so provider is aware. - Please contact requesting surgeon's office via preferred method (i.e, phone, fax) to inform them of need for appointment prior to surgery.  If applicable, this message will also be routed to pharmacy pool and/or primary cardiologist for input on holding anticoagulant/antiplatelet agent as requested below so that this information is available to the clearing provider at time of patient's appointment.   Richardson Dopp, PA-C  06/30/2020, 4:57 PM

## 2020-07-01 NOTE — Telephone Encounter (Signed)
Patient returned call, he will be out of town and can not make the appt on 8/20.  He stated he will callback when he returns.

## 2020-07-01 NOTE — Telephone Encounter (Signed)
Patient with diagnosis of afib on Eliquis for anticoagulation.    Procedure: prostate biopsy Date of procedure: TBD  CHADS2-VASc score of 5 (age, CHF, HTN, DM, CAD - scattered calcifications noted on chest CT in Nov 2019). Also has history of single provoked DVT post knee surgery in 2001.  CrCl >122mL/min using adjusted body weight Platelet count 244K  Per office protocol, patient can hold Eliquis for 2 days prior to procedure.

## 2020-07-01 NOTE — Telephone Encounter (Signed)
Left a detailed voice message for the surgery scheduler for the requesting office stating that the patient will need an appointment for clearance with out office and that the patient is currently out of town and will give our office a call to set up an appointment. Also stated that if there were an questions to give our office a call and ask for he Pre-Op pool.

## 2020-07-03 ENCOUNTER — Ambulatory Visit: Payer: PPO | Admitting: Interventional Cardiology

## 2020-07-14 DIAGNOSIS — R202 Paresthesia of skin: Secondary | ICD-10-CM | POA: Diagnosis not present

## 2020-07-16 ENCOUNTER — Other Ambulatory Visit: Payer: Self-pay | Admitting: Family Medicine

## 2020-07-16 ENCOUNTER — Other Ambulatory Visit: Payer: Self-pay | Admitting: Interventional Cardiology

## 2020-07-16 DIAGNOSIS — F32A Depression, unspecified: Secondary | ICD-10-CM

## 2020-07-16 DIAGNOSIS — F419 Anxiety disorder, unspecified: Secondary | ICD-10-CM

## 2020-07-16 NOTE — Telephone Encounter (Signed)
Is this okay to refill? 

## 2020-07-16 NOTE — Telephone Encounter (Signed)
Pt last saw Dr Tamala Julian 12/10/19, last labs 12/10/19 Creat 0.84, age 69, weight 106.1kg, based on specified criteria pt is on appropriate dosage of Eliquis 5mg  BID.  Will refill rx.

## 2020-07-29 ENCOUNTER — Other Ambulatory Visit: Payer: Self-pay | Admitting: Urology

## 2020-07-29 DIAGNOSIS — C61 Malignant neoplasm of prostate: Secondary | ICD-10-CM

## 2020-08-03 ENCOUNTER — Encounter: Payer: Self-pay | Admitting: Family Medicine

## 2020-08-03 ENCOUNTER — Other Ambulatory Visit (INDEPENDENT_AMBULATORY_CARE_PROVIDER_SITE_OTHER): Payer: PPO

## 2020-08-03 ENCOUNTER — Telehealth: Payer: Self-pay

## 2020-08-03 ENCOUNTER — Telehealth (INDEPENDENT_AMBULATORY_CARE_PROVIDER_SITE_OTHER): Payer: PPO | Admitting: Family Medicine

## 2020-08-03 ENCOUNTER — Other Ambulatory Visit: Payer: Self-pay

## 2020-08-03 VITALS — Temp 98.0°F | Wt 233.0 lb

## 2020-08-03 DIAGNOSIS — R05 Cough: Secondary | ICD-10-CM | POA: Diagnosis not present

## 2020-08-03 DIAGNOSIS — Z20822 Contact with and (suspected) exposure to covid-19: Secondary | ICD-10-CM

## 2020-08-03 DIAGNOSIS — J4531 Mild persistent asthma with (acute) exacerbation: Secondary | ICD-10-CM | POA: Diagnosis not present

## 2020-08-03 DIAGNOSIS — R059 Cough, unspecified: Secondary | ICD-10-CM

## 2020-08-03 LAB — POCT INFLUENZA A/B
Influenza A, POC: NEGATIVE
Influenza B, POC: NEGATIVE

## 2020-08-03 LAB — POC COVID19 BINAXNOW: SARS Coronavirus 2 Ag: NEGATIVE

## 2020-08-03 NOTE — Progress Notes (Signed)
   Subjective:    Patient ID: Jeremy Tucker, male    DOB: 03/01/1951, 69 y.o.   MRN: [id number]  HPI I connected with  Jeremy Tucker on 08/03/20 by a video enabled telemedicine application and verified that I am speaking with the correct person using two identifiers.  caregility used.  I am in my office and he is at home I discussed the limitations of evaluation and management by telemedicine. The patient expressed understanding and agreed to proceed. He states that on Saturday he developed a slight cough with some asthma and started taking his albuterol and Asmanex again.  He also had a slight sore throat and nasal congestion with some fatigue but no fever, chills, smell or taste change.   Review of Systems     Objective:   Physical Exam Alert and in no distress otherwise not examined Covid testing is negative.       Assessment & Plan:  Encounter by telehealth for suspected COVID-19  Mild persistent asthma with acute exacerbation I explained that we wait for the PCR but in the meantime he should treat his symptoms and get back on taking Asmanex on a regular basis.  He is to use the albuterol for rescue if needed.  Recommend using Robitussin-DM if he needs a cough suppressant.  10 minutes spent discussing this with him.

## 2020-08-03 NOTE — Telephone Encounter (Signed)
Pt. Called wanting to speak with. He said he started getting a cold Saturday cough, ST, fatigue had friends over to celebrate jewish holiday and one of them was sick. He wanted to know if he could come in to get a covid test I offered him a virtual with Dr. Tomi Bamberger and he wanted to know if he could just come in for a covid test wanted to make sure he did not have covid.

## 2020-08-04 ENCOUNTER — Telehealth: Payer: Self-pay | Admitting: Family Medicine

## 2020-08-04 NOTE — Telephone Encounter (Signed)
Sent proof of Covid vaccine card to patient

## 2020-08-05 ENCOUNTER — Telehealth: Payer: Self-pay | Admitting: Family Medicine

## 2020-08-05 LAB — NOVEL CORONAVIRUS, NAA: SARS-CoV-2, NAA: NOT DETECTED

## 2020-08-05 LAB — SARS-COV-2, NAA 2 DAY TAT

## 2020-08-05 NOTE — Telephone Encounter (Signed)
Patient wants to talk to you about his bronchitis

## 2020-08-05 NOTE — Progress Notes (Deleted)
Cardiology Office Note:    Date:  08/05/2020   ID:  Jeremy Tucker, DOB 02-Nov-1951, MRN [id number]  PCP:  Denita Lung, MD  Cardiologist:  [name], MD   Referring MD: Denita Lung, MD   No chief complaint on file.   History of Present Illness:    Jeremy Tucker is a 69 y.o. male with a hx of PAF with hx of ablation 02/2015, HTN, mod aortic stenosis, GERD, HLD and anxiety disorder, chronic anticoagulation with Eliquis.  ***  Past Medical History:  Diagnosis Date  . Aortic stenosis, mild   . Atrial fibrillation ([place])   . BPH (benign prostatic hyperplasia)   . Bronchitis    patient mentions " i am prone to bronchitis"   . Controlled diabetes mellitus type 2 with complications ([place])   . Depression   . Diabetic peripheral neuropathy ([place]) 01/07/2019  . DJD (degenerative joint disease)   . DVT (deep venous thrombosis) ([place])    post op knee surgery in 2001 , behind calf  ; was resolved with blood thinners   . Dysrhythmia    Atrial fibrillation  . Gastric polyp 08/02/2018   see report in chart  . GERD (gastroesophageal reflux disease)   . Glucose intolerance (impaired glucose tolerance)   . Gout    denies  . Hiatal hernia 08/02/2018   see report in chart  . Hyperlipidemia   . Hyperlipidemia associated with type 2 diabetes mellitus ([place])   . Hypertension    denies  . Hypertension associated with diabetes ([place])   . Kidney stones   . Obesity   . 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 Persistent atrial fibrillation ([place])    a. CHADsVASc score at least 2  . Right carpal tunnel syndrome 01/07/2019  . Status post placement of implantable loop recorder 2 years ago ; 2016   left upper chest     Past Surgical History:  Procedure Laterality Date  . ATRIAL FIBRILLATION ABLATION N/A 03/12/2015   Procedure: ATRIAL FIBRILLATION ABLATION;  Surgeon: [name], MD;  Location: [facility] CATH LAB;  Service: Cardiovascular;  Laterality: N/A;  . CARDIAC CATHETERIZATION   1990's   "Dr. Melvern Banker", no blockages per pt  . CARDIOVERSION N/A 02/25/2015   Procedure: CARDIOVERSION;  Surgeon: Sueanne Margarita, MD;  Location: [facility];  Service: Cardiovascular;  Laterality: N/A;  . COLONOSCOPY  2010   [facility]  . CYSTOSCOPY W/ STONE MANIPULATION  1980's   "couldn't get stone so they had to cut me open"  . CYSTOSCOPY WITH RETROGRADE PYELOGRAM, URETEROSCOPY AND STENT PLACEMENT Left 04/24/2015   Procedure: URETHRAL MEATAL DILATION, CYSTOSCOPY WITH LEFT RETROGRADE PYELOGRAM, LEFT URETEROSCOPY, STONE BASKET EXTRACTION,  LEFT DOUBLE J STENT PLACEMENT;  Surgeon: Carolan Clines, MD;  Location: [facility];  Service: Urology;  Laterality: Left;  . EP IMPLANTABLE DEVICE N/A 09/15/2015   Procedure: Loop Recorder Insertion;  Surgeon: [name], MD;  Location: [facility];  Service: Cardiovascular;  Laterality: N/A;  . EXTRACORPOREAL SHOCK WAVE LITHOTRIPSY Bilateral 04/02/2020   Procedure: EXTRACORPOREAL SHOCK WAVE LITHOTRIPSY (ESWL);  Surgeon: Cleon Gustin, MD;  [facility];  Service: Urology;  Laterality: Bilateral;  . EXTRACORPOREAL SHOCK WAVE LITHOTRIPSY Left 04/06/2020   Procedure: EXTRACORPOREAL SHOCK WAVE LITHOTRIPSY (ESWL);  Surgeon: [name], MD;  Location: [facility];  Service: Urology;  Laterality: Left;  . EYE SURGERY Bilateral 2016   cataract surgery with implant ; dr Gerald Stabs grote   .  FOREARM FRACTURE SURGERY Right ~ 1961  . FRACTURE SURGERY    . HOLMIUM LASER APPLICATION Left 1/91/4782   Procedure: HOLMIUM LASER APPLICATION;  Surgeon: Carolan Clines, MD;  Location: [facility];  Service: Urology;  Laterality: Left;  . JOINT REPLACEMENT    . KIDNEY STONE SURGERY  1980's   "stone lodged in the duct; had to cut me open to get it"  . KNEE ARTHROSCOPY Bilateral "multiple times"  . LEFT HEART CATH AND CORONARY ANGIOGRAPHY N/A 09/19/2018   Procedure: LEFT HEART CATH AND CORONARY ANGIOGRAPHY;  Surgeon: Belva Crome, MD;  Location: [facility];  Service: Cardiovascular;  Laterality: N/A;  . SKIN BIOPSY Left 05/06/2020   Shave biopsy left posterior hand neurofiberoma.  . TEE WITHOUT CARDIOVERSION N/A 03/12/2015   Procedure: TRANSESOPHAGEAL ECHOCARDIOGRAM (TEE);  Surgeon: Pixie Casino, MD;  Location: [facility] ENDOSCOPY;  Service: Cardiovascular;  Laterality: N/A;  . TONSILLECTOMY    . TOTAL KNEE ARTHROPLASTY Left 2001   [facility]  . TOTAL KNEE ARTHROPLASTY Right 10/30/2017   Procedure: RIGHT TOTAL KNEE ARTHROPLASTY;  Surgeon: Paralee Cancel, MD;  Location: [facility];  Service: Orthopedics;  Laterality: Right;  90 mins    Current Medications: No outpatient medications have been marked as taking for the 08/07/20 encounter (Appointment) with Belva Crome, MD.     Allergies:   Codeine and Oxycodone   Social History   Socioeconomic History  . Marital status: Married    Spouse name: Not on file  . Number of children: 1  . Years of education: Not on file  . Highest education level: Some college, no degree  Occupational History  . Occupation: Retired  Tobacco Use  . Smoking status: Never Smoker  . Smokeless tobacco: Never Used  Vaping Use  . Vaping Use: Never used  Substance and Sexual Activity  . Alcohol use: Yes    Comment: seldom   . Drug use: No  . Sexual activity: Yes  Other Topics Concern  . Not on file  Social History Narrative   Pt lives in [place] with spouse.  40 yo son is health.  Second son died in car accident.      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 Difficulty of Paying Living Expenses: Not on file  Food Insecurity:   . Worried About Charity fundraiser in the Last Year: Not on file  . Ran Out of Food in the Last Year: Not on file  Transportation Needs:   . Lack of Transportation (Medical): Not on file  . Lack of Transportation (Non-Medical): Not on file    Physical Activity:   . Days of Exercise per Week: Not on file  . Minutes of Exercise per Session: Not on file  Stress:   . Feeling of Stress : Not on file  Social Connections:   . Frequency of Communication with Friends and Family: Not on file  . Frequency of Social Gatherings with Friends and Family: Not on file  . Attends Religious Services: Not on file  . Active Member of Clubs or Organizations: Not on file  . Attends Archivist Meetings: Not on file  . Marital Status: Not on file     Family History: The patient's family history includes Healthy in his brother; Heart attack (age of onset: 42) in his father; Heart attack (age of onset:  53) in his mother.  ROS:   Please see the history of present illness.    *** All other systems reviewed and are negative.  EKGs/Labs/Other Studies Reviewed:    The following studies were reviewed today:  Echocardiogram 12/2019: IMPRESSIONS    1. Left ventricular ejection fraction, by estimation, is 65 to 70%. The  left ventricle has hyperdynamic function. The left ventrical has no  regional wall motion abnormalities. There is moderately increased left  ventricular hypertrophy. Left ventricular  diastolic parameters are indeterminate.  2. Right ventricular systolic function is normal. The right ventricular  size is normal. There is mildly elevated pulmonary artery systolic  pressure.  3. Mild mitral valve regurgitation.  4. The aortic valve is tricuspid. Aortic valve regurgitation is trivial .  Moderate aortic valve stenosis. Aortic valve area, by VTI measures 1.22  cm. Aortic valve mean gradient measures 29.2 mmHg. Aortic valve Vmax  measures 3.60 m/s.  5. The inferior vena cava is normal in size with greater than 50%  respiratory variability, suggesting right atrial pressure of 3 mmHg.   Comparison(s): Mild increase in aortic stenosis when compared to prior  (Vmax 3.46m/s).     EKG:  EKG ***  Recent  Labs: 12/10/2019: BUN 15; Creatinine, Ser 0.84; Potassium 4.5; Sodium 142 01/28/2020: Hemoglobin 15.4; Platelets 244  Recent Lipid Panel    Component Value Date/Time   CHOL 195 01/28/2020 1023   TRIG 223 (H) 01/28/2020 1023   HDL 44 01/28/2020 1023   CHOLHDL 4.4 01/28/2020 1023   CHOLHDL 3.6 08/16/2016 0925   VLDL 28 08/16/2016 0925   LDLCALC 112 (H) 01/28/2020 1023    Physical Exam:    VS:  There were no vitals taken for this visit.    Wt Readings from Last 3 Encounters:  08/03/20 233 lb (105.7 kg)  04/06/20 233 lb 14.4 oz (106.1 kg)  04/02/20 236 lb (107 kg)     GEN: ***. No acute distress HEENT: Normal NECK: No JVD. LYMPHATICS: No lymphadenopathy CARDIAC: *** RRR without murmur, gallop, or edema. VASCULAR: *** Normal Pulses. No bruits. RESPIRATORY:  Clear to auscultation without rales, wheezing or rhonchi  ABDOMEN: Soft, non-tender, non-distended, No pulsatile mass, MUSCULOSKELETAL: No deformity  SKIN: Warm and dry NEUROLOGIC:  Alert and oriented x 3 PSYCHIATRIC:  Normal affect   ASSESSMENT:    1. Aortic stenosis, moderate   2. Chronic anticoagulation   3. Paroxysmal atrial fibrillation (HCC)   4. Essential hypertension   5. Hyperlipidemia associated with type 2 diabetes mellitus ([place])   6. Educated about COVID-19 virus infection    PLAN:    In order of problems listed above:  1. ***   Medication Adjustments/Labs and Tests Ordered: Current medicines are reviewed at length with the patient today.  Concerns regarding medicines are outlined above.  No orders of the defined types were placed in this encounter.  No orders of the defined types were placed in this encounter.   There are no Patient Instructions on file for this visit.   Signed, [name], MD  08/05/2020 10:11 PM    [place]

## 2020-08-06 ENCOUNTER — Telehealth: Payer: Self-pay

## 2020-08-06 NOTE — Telephone Encounter (Signed)
.   Called wanting PCR covid test results I told him per Verdis Frederickson looked it up and it was Negative. He wanted to know if you could give him call later today when you have a chance please. [phone].

## 2020-08-07 ENCOUNTER — Ambulatory Visit: Payer: PPO | Admitting: Interventional Cardiology

## 2020-08-07 NOTE — Telephone Encounter (Signed)
Thank you :)

## 2020-08-07 NOTE — Telephone Encounter (Signed)
S/w pt and he has been rescheduled to see Dr. Tamala Julian 08/21/20 @ 2:40 ok per Marveen Reeks RN for for Dr. Tamala Julian. Pt thanked me for the help. I will forward the clearance notes to MD for upcoming appt.

## 2020-08-07 NOTE — Telephone Encounter (Signed)
Patient called to reschedule his appointment due to a cold. Patient stated he needed to only see Dr. Tamala Julian. I did not see anything available until November, but he states he much be seen sooner and would like to know if he can be worked in.

## 2020-08-07 NOTE — Telephone Encounter (Signed)
Looks like pt cancelled 2 appts that he did have with Dr. Tamala Julian and that he needs a pre op appt. I will call him and see what we can do. Not sure Dr. Tamala Julian has anything sooner than what the operator say. I may need to reach out to Dr. Thompson Caul nurse for help as well.

## 2020-08-09 ENCOUNTER — Other Ambulatory Visit: Payer: Self-pay | Admitting: Family Medicine

## 2020-08-09 DIAGNOSIS — F419 Anxiety disorder, unspecified: Secondary | ICD-10-CM

## 2020-08-09 DIAGNOSIS — F32A Depression, unspecified: Secondary | ICD-10-CM

## 2020-08-10 NOTE — Telephone Encounter (Signed)
[facility] is requesting to fill pt wellbutrin. [place]

## 2020-08-20 ENCOUNTER — Encounter: Payer: Self-pay | Admitting: Family Medicine

## 2020-08-20 NOTE — Progress Notes (Signed)
Cardiology Office Note:    Date:  08/21/2020   ID:  Jeremy Tucker, DOB Aug 27, 1951, MRN [id number]  PCP:  Denita Lung, MD  Cardiologist:  [name], MD   Referring MD: Denita Lung, MD   Chief Complaint  Patient presents with  . Cardiac Valve Problem    Aortic stenosis  . Advice Only    Cardiac clearance for prostate surgery    History of Present Illness:    Jeremy Tucker is a 69 y.o. male with a hx of 02/2015, HTN, mod aortic stenosis, GERD, HLD and anxiety disorder, paroxysmal atrial fibrillation, chronic anticoagulation with Eliquis.  He denies angina, change in exertional tolerance, orthopnea, PND, dyspnea on exertion, and syncope. If he bends over and resumes the upright position quickly, he will feel somewhat dizzy. He has not noticed swelling. He is having no significant palpitations.  He is to have a prostate biopsy by Dr. Dorina Hoyer. PSA is slowly climbing. Previous biopsy revealed slow-growing prostate CA. Being rechecked to make sure there is not a change in aggressiveness. He is on chronic anticoagulation therapy due to history of paroxysmal atrial fibrillation. No bleeding issues...   Past Medical History:  Diagnosis Date  . Aortic stenosis, mild   . Atrial fibrillation ([place])   . BPH (benign prostatic hyperplasia)   . Bronchitis    patient mentions " i am prone to bronchitis"   . Controlled diabetes mellitus type 2 with complications ([place])   . Depression   . Diabetic peripheral neuropathy ([place]) 01/07/2019  . DJD (degenerative joint disease)   . DVT (deep venous thrombosis) ([place])    post op knee surgery in 2001 , behind calf  ; was resolved with blood thinners   . Dysrhythmia    Atrial fibrillation  . Gastric polyp 08/02/2018   see report in chart  . GERD (gastroesophageal reflux disease)   . Glucose intolerance (impaired glucose tolerance)   . Gout    denies  . Hiatal hernia 08/02/2018   see report in chart  . Hyperlipidemia   . Hyperlipidemia  associated with type 2 diabetes mellitus ([place])   . Hypertension    denies  . Hypertension associated with diabetes ([place])   . Kidney stones   . Obesity   . 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 Persistent atrial fibrillation ([place])    a. CHADsVASc score at least 2  . Right carpal tunnel syndrome 01/07/2019  . Status post placement of implantable loop recorder 2 years ago ; 2016   left upper chest     Past Surgical History:  Procedure Laterality Date  . ATRIAL FIBRILLATION ABLATION N/A 03/12/2015   Procedure: ATRIAL FIBRILLATION ABLATION;  Surgeon: [name], MD;  Location: [facility] CATH LAB;  Service: Cardiovascular;  Laterality: N/A;  . CARDIAC CATHETERIZATION  1990's   "Dr. Melvern Banker", no blockages per pt  . CARDIOVERSION N/A 02/25/2015   Procedure: CARDIOVERSION;  Surgeon: Sueanne Margarita, MD;  Location: [facility];  Service: Cardiovascular;  Laterality: N/A;  . COLONOSCOPY  2010   MEDOFF  . CYSTOSCOPY W/ STONE MANIPULATION  1980's   "couldn't get stone so they had to cut me open"  . CYSTOSCOPY WITH RETROGRADE PYELOGRAM, URETEROSCOPY AND STENT PLACEMENT Left 04/24/2015   Procedure: URETHRAL MEATAL DILATION, CYSTOSCOPY WITH LEFT RETROGRADE PYELOGRAM, LEFT URETEROSCOPY, STONE BASKET EXTRACTION,  LEFT DOUBLE J STENT PLACEMENT;  Surgeon: Carolan Clines, MD;  Location: [facility];  Service: Urology;  Laterality: Left;  Marland Kitchen EP  IMPLANTABLE DEVICE N/A 09/15/2015   Procedure: Loop Recorder Insertion;  Surgeon: [name], MD;  Location: [facility];  Service: Cardiovascular;  Laterality: N/A;  . EXTRACORPOREAL SHOCK WAVE LITHOTRIPSY Bilateral 04/02/2020   Procedure: EXTRACORPOREAL SHOCK WAVE LITHOTRIPSY (ESWL);  Surgeon: Cleon Gustin, MD;  Location: [facility];  Service: Urology;  Laterality: Bilateral;  . EXTRACORPOREAL SHOCK WAVE LITHOTRIPSY Left 04/06/2020   Procedure: EXTRACORPOREAL SHOCK WAVE LITHOTRIPSY (ESWL);  Surgeon: [name], MD;  Location:  [facility];  Service: Urology;  Laterality: Left;  . EYE SURGERY Bilateral 2016   cataract surgery with implant ; dr Gerald Stabs grote   . FOREARM FRACTURE SURGERY Right ~ 1961  . FRACTURE SURGERY    . HOLMIUM LASER APPLICATION Left 06/23/1750   Procedure: HOLMIUM LASER APPLICATION;  Surgeon: Carolan Clines, MD;  Location: [facility];  Service: Urology;  Laterality: Left;  . JOINT REPLACEMENT    . KIDNEY STONE SURGERY  1980's   "stone lodged in the duct; had to cut me open to get it"  . KNEE ARTHROSCOPY Bilateral "multiple times"  . LEFT HEART CATH AND CORONARY ANGIOGRAPHY N/A 09/19/2018   Procedure: LEFT HEART CATH AND CORONARY ANGIOGRAPHY;  Surgeon: Belva Crome, MD;  Location: [facility];  Service: Cardiovascular;  Laterality: N/A;  . SKIN BIOPSY Left 05/06/2020   Shave biopsy left posterior hand neurofiberoma.  . TEE WITHOUT CARDIOVERSION N/A 03/12/2015   Procedure: TRANSESOPHAGEAL ECHOCARDIOGRAM (TEE);  Surgeon: Pixie Casino, MD;  Location: [facility] ENDOSCOPY;  Service: Cardiovascular;  Laterality: N/A;  . TONSILLECTOMY    . TOTAL KNEE ARTHROPLASTY Left 2001   [facility]  . TOTAL KNEE ARTHROPLASTY Right 10/30/2017   Procedure: RIGHT TOTAL KNEE ARTHROPLASTY;  Surgeon: Paralee Cancel, MD;  Location: [facility];  Service: Orthopedics;  Laterality: Right;  90 mins    Current Medications: Current Meds  Medication Sig  . acetaminophen (TYLENOL) 500 MG tablet Take 1,000 mg by mouth every 8 (eight) hours as needed for mild pain.   Marland Kitchen albuterol (VENTOLIN HFA) 108 (90 Base) MCG/ACT inhaler INHALE 2 PUFFS INTO THE LUNGS EVERY 6 HOURS AS NEEDED FOR WHEEZING OR SHORTNESS OF BREATH  . allopurinol (ZYLOPRIM) 300 MG tablet Take 1 tablet (300 mg total) by mouth daily.  Marland Kitchen ammonium lactate (LAC-HYDRIN) 12 % lotion Apply 1 application topically daily as needed for dry skin (3-4 TIMES WEEK).   Marland Kitchen atorvastatin (LIPITOR) 40 MG tablet Take 1 tablet (40 mg total) by mouth daily.  Marland Kitchen buPROPion  (WELLBUTRIN XL) 300 MG 24 hr tablet Take 150 mg by mouth daily.  Marland Kitchen diltiazem (TIAZAC) 180 MG 24 hr capsule Take 1 capsule (180 mg total) by mouth daily.  Marland Kitchen ELIQUIS 5 MG TABS tablet TAKE ONE TABLET BY MOUTH TWICE A DAY  . loratadine (CLARITIN) 10 MG tablet Take 10 mg by mouth daily.  Marland Kitchen LORazepam (ATIVAN) 0.5 MG tablet TAKE 1 TABLET TWICE DAILY AS NEEDED FOR ANXIETY.  . metoprolol succinate (TOPROL-XL) 25 MG 24 hr tablet TAKE 1 TABLET BY MOUTH DAILY IN THE EVENING.  . mometasone (ASMANEX, 60 METERED DOSES,) 220 MCG/INH inhaler Inhale 2 puffs into the lungs daily.  . Multiple Vitamins-Minerals (MULTIVITAMIN WITH MINERALS) tablet Take 1 tablet by mouth daily.    Marland Kitchen olopatadine (PATANOL) 0.1 % ophthalmic solution Place 1 drop into both eyes daily as needed for allergies (tearing).   . Omega-3 Fatty Acids (FISH OIL TRIPLE STRENGTH) 1400 MG CAPS Take 1,400 mg by mouth daily.   Marland Kitchen  omeprazole (PRILOSEC) 20 MG capsule Take 20 mg by mouth daily.  . polyethylene glycol (MIRALAX / GLYCOLAX) packet Take 17 g by mouth 2 (two) times daily.  . sertraline (ZOLOFT) 50 MG tablet TAKE ONE TABLET BY MOUTH DAILY  . tamsulosin (FLOMAX) 0.4 MG CAPS capsule Take 0.4 mg by mouth daily.   . [DISCONTINUED] buPROPion (WELLBUTRIN XL) 300 MG 24 hr tablet TAKE ONE TABLET BY MOUTH DAILY     Allergies:   Codeine and Oxycodone   Social History   Socioeconomic History  . Marital status: Married    Spouse name: Not on file  . Number of children: 1  . Years of education: Not on file  . Highest education level: Some college, no degree  Occupational History  . Occupation: Retired  Tobacco Use  . Smoking status: Never Smoker  . Smokeless tobacco: Never Used  Vaping Use  . Vaping Use: Never used  Substance and Sexual Activity  . Alcohol use: Yes    Comment: seldom   . Drug use: No  . Sexual activity: Yes  Other Topics Concern  . Not on file  Social History Narrative   Pt lives in [place] with spouse.  51 yo son is  health.  Second son died in car accident.      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 Difficulty of Paying Living Expenses: Not on file  Food Insecurity:   . Worried About Charity fundraiser in the Last Year: Not on file  . Ran Out of Food in the Last Year: Not on file  Transportation Needs:   . Lack of Transportation (Medical): Not on file  . Lack of Transportation (Non-Medical): Not on file  Physical Activity:   . Days of Exercise per Week: Not on file  . Minutes of Exercise per Session: Not on file  Stress:   . Feeling of Stress : Not on file  Social Connections:   . Frequency of Communication with Friends and Family: Not on file  . Frequency of Social Gatherings with Friends and Family: Not on file  . Attends Religious Services: Not on file  . Active Member of Clubs or Organizations: Not on file  . Attends Archivist Meetings: Not on file  . Marital Status: Not on file     Family History: The patient's family history includes Healthy in his brother; Heart attack (age of onset: 32) in his father; Heart attack (age of onset: 89) in his mother.  ROS:   Please see the history of present illness.    Some anxiety concerning his prostate situation. Also some depression related to illnesses among friends. All other systems reviewed and are negative.  EKGs/Labs/Other Studies Reviewed:    The following studies were reviewed today:  2D Doppler echocardiogram February 2021: IMPRESSIONS    1. Left ventricular ejection fraction, by estimation, is 65 to 70%. The  left ventricle has hyperdynamic function. The left ventrical has no  regional wall motion abnormalities. There is moderately increased left  ventricular hypertrophy. Left ventricular  diastolic parameters are indeterminate.  2. Right ventricular systolic function is normal. The right ventricular  size  is normal. There is mildly elevated pulmonary artery systolic  pressure.  3. Mild mitral valve regurgitation.  4. The aortic valve is tricuspid. Aortic valve regurgitation is trivial .  Moderate aortic valve stenosis.  Aortic valve area, by VTI measures 1.22  cm. Aortic valve mean gradient measures 29.2 mmHg. Aortic valve Vmax  measures 3.60 m/s.  5. The inferior vena cava is normal in size with greater than 50%  respiratory variability, suggesting right atrial pressure of 3 mmHg.   EKG:  EKG normal sinus rhythm, prominent voltage, inferior T wave abnormality in III and aVF. No changes noted when compared to January 2021.  Recent Labs: 12/10/2019: BUN 15; Creatinine, Ser 0.84; Potassium 4.5; Sodium 142 01/28/2020: Hemoglobin 15.4; Platelets 244  Recent Lipid Panel    Component Value Date/Time   CHOL 195 01/28/2020 1023   TRIG 223 (H) 01/28/2020 1023   HDL 44 01/28/2020 1023   CHOLHDL 4.4 01/28/2020 1023   CHOLHDL 3.6 08/16/2016 0925   VLDL 28 08/16/2016 0925   LDLCALC 112 (H) 01/28/2020 1023    Physical Exam:    VS:  BP 132/78   Pulse 74   Ht 6' (1.829 m)   Wt 243 lb 12.8 oz (110.6 kg)   SpO2 94%   BMI 33.07 kg/m     Wt Readings from Last 3 Encounters:  08/21/20 243 lb 12.8 oz (110.6 kg)  08/03/20 233 lb (105.7 kg)  04/06/20 233 lb 14.4 oz (106.1 kg)     GEN: Overweight.. No acute distress HEENT: Normal NECK: No JVD. LYMPHATICS: No lymphadenopathy CARDIAC: 2-3 over six right upper sternal border crescendo decrescendo systolic murmur. Murmur increases in intensity with the patient lying at 30 degrees. There is much more transmission of the murmur into the carotids bilaterally. RRR without diastolic murmur, gallop, or edema. VASCULAR:  Normal Pulses. No bruits. RESPIRATORY:  Clear to auscultation without rales, wheezing or rhonchi  ABDOMEN: Soft, non-tender, non-distended, No pulsatile mass, MUSCULOSKELETAL: No deformity  SKIN: Warm and dry NEUROLOGIC:  Alert and  oriented x 3 PSYCHIATRIC:  Normal affect   ASSESSMENT:    1. Aortic stenosis, moderate   2. Paroxysmal atrial fibrillation (HCC)   3. Preoperative clearance   4. Chronic anticoagulation   5. Essential hypertension   6. Hyperlipidemia associated with type 2 diabetes mellitus ([place])   7. OSA (obstructive sleep apnea)   8. Educated about COVID-19 virus infection    PLAN:    In order of problems listed above:  1. Moderate aortic stenosis without new symptoms or change in clinical status since echo performed in January 2021. We will plan to see him again in 6 months at which time an echo will be repeated to continue to monitor progression. He is cautioned to call if angina, syncope, or dyspnea. 2. No current symptoms suggestive of atrial fibrillation. He is on Eliquis without bleeding complications. 3. He is cleared to proceed with prostate biopsy. It is okay for him to hold Eliquis for 72 hours prior to the procedure. 4. Continue Eliquis. Pause Eliquis therapy for surgery as noted above. 5. Continue Tiazac 180 mg/day, Toprol 25 mg daily (succinate) 6. Continue high intensity Lipitor 40 mg/day. LDL will need to be done later this year or early next year. 7. He endorses compliance with CPAP. 8. He has been vaccinated. Has not gotten a boost. Audiological scientist.  Plan follow-up in 6 months. This will be yearly follow-up of aortic stenosis. An echo will be done prior to that office visit.   Medication Adjustments/Labs and Tests Ordered: Current medicines are reviewed at length with the patient today.  Concerns regarding medicines are outlined above.  Orders Placed This Encounter  Procedures  . EKG 12-Lead  No orders of the defined types were placed in this encounter.   Patient Instructions  Medication Instructions:  Your physician recommends that you continue on your current medications as directed. Please refer to the Current Medication list given to you today.  *If you need a  refill on your cardiac medications before your next appointment, please call your pharmacy*   Lab Work: None  If you have labs (blood work) drawn today and your tests are completely normal, you will receive your results only by: Marland Kitchen MyChart Message (if you have MyChart) OR . A paper copy in the mail If you have any lab test that is abnormal or we need to change your treatment, we will call you to review the results.   Testing/Procedures: Your physician has requested that you have an echocardiogram in March. Echocardiography is a painless test that uses sound waves to create images of your heart. It provides your doctor with information about the size and shape of your heart and how well your heart's chambers and valves are working. This procedure takes approximately one hour. There are no restrictions for this procedure.     Follow-Up: At [facility], you and your health needs are our priority.  As part of our continuing mission to provide you with exceptional heart care, we have created designated Provider Care Teams.  These Care Teams include your primary Cardiologist (physician) and Advanced Practice Providers (APPs -  Physician Assistants and Nurse Practitioners) who all work together to provide you with the care you need, when you need it.  We recommend signing up for the patient portal called "MyChart".  Sign up information is provided on this After Visit Summary.  MyChart is used to connect with patients for Virtual Visits (Telemedicine).  Patients are able to view lab/test results, encounter notes, upcoming appointments, etc.  Non-urgent messages can be sent to your provider as well.   To learn more about what you can do with MyChart, go to [email or web address].    Your next appointment:   6 month(s)  The format for your next appointment:   In Person  Provider:   You may see [name], MD or one of the following Advanced Practice Providers on your designated Care  Team:    Truitt Merle, NP  Cecilie Kicks, NP  Kathyrn Drown, NP    Other Instructions      Signed, [name], MD  08/21/2020 3:23 PM    [place]

## 2020-08-21 ENCOUNTER — Ambulatory Visit: Payer: PPO | Admitting: Interventional Cardiology

## 2020-08-21 ENCOUNTER — Encounter: Payer: Self-pay | Admitting: Interventional Cardiology

## 2020-08-21 ENCOUNTER — Other Ambulatory Visit: Payer: Self-pay

## 2020-08-21 VITALS — BP 132/78 | HR 74 | Ht 72.0 in | Wt 243.8 lb

## 2020-08-21 DIAGNOSIS — E1169 Type 2 diabetes mellitus with other specified complication: Secondary | ICD-10-CM | POA: Diagnosis not present

## 2020-08-21 DIAGNOSIS — Z7901 Long term (current) use of anticoagulants: Secondary | ICD-10-CM | POA: Diagnosis not present

## 2020-08-21 DIAGNOSIS — Z01818 Encounter for other preprocedural examination: Secondary | ICD-10-CM

## 2020-08-21 DIAGNOSIS — I350 Nonrheumatic aortic (valve) stenosis: Secondary | ICD-10-CM

## 2020-08-21 DIAGNOSIS — I10 Essential (primary) hypertension: Secondary | ICD-10-CM

## 2020-08-21 DIAGNOSIS — E785 Hyperlipidemia, unspecified: Secondary | ICD-10-CM | POA: Diagnosis not present

## 2020-08-21 DIAGNOSIS — I480 Paroxysmal atrial fibrillation: Secondary | ICD-10-CM

## 2020-08-21 DIAGNOSIS — G4733 Obstructive sleep apnea (adult) (pediatric): Secondary | ICD-10-CM | POA: Diagnosis not present

## 2020-08-21 DIAGNOSIS — Z7189 Other specified counseling: Secondary | ICD-10-CM

## 2020-08-21 NOTE — Patient Instructions (Signed)
Medication Instructions:  Your physician recommends that you continue on your current medications as directed. Please refer to the Current Medication list given to you today.  *If you need a refill on your cardiac medications before your next appointment, please call your pharmacy*   Lab Work: None  If you have labs (blood work) drawn today and your tests are completely normal, you will receive your results only by: Marland Kitchen MyChart Message (if you have MyChart) OR . A paper copy in the mail If you have any lab test that is abnormal or we need to change your treatment, we will call you to review the results.   Testing/Procedures: Your physician has requested that you have an echocardiogram in March. Echocardiography is a painless test that uses sound waves to create images of your heart. It provides your doctor with information about the size and shape of your heart and how well your heart's chambers and valves are working. This procedure takes approximately one hour. There are no restrictions for this procedure.     Follow-Up: At [facility], you and your health needs are our priority.  As part of our continuing mission to provide you with exceptional heart care, we have created designated Provider Care Teams.  These Care Teams include your primary Cardiologist (physician) and Advanced Practice Providers (APPs -  Physician Assistants and Nurse Practitioners) who all work together to provide you with the care you need, when you need it.  We recommend signing up for the patient portal called "MyChart".  Sign up information is provided on this After Visit Summary.  MyChart is used to connect with patients for Virtual Visits (Telemedicine).  Patients are able to view lab/test results, encounter notes, upcoming appointments, etc.  Non-urgent messages can be sent to your provider as well.   To learn more about what you can do with MyChart, go to [email or web address].    Your next appointment:    6 month(s)  The format for your next appointment:   In Person  Provider:   You may see [name], MD or one of the following Advanced Practice Providers on your designated Care Team:    Truitt Merle, NP  Cecilie Kicks, NP  Kathyrn Drown, NP    Other Instructions

## 2020-08-31 ENCOUNTER — Telehealth: Payer: Self-pay | Admitting: Interventional Cardiology

## 2020-08-31 NOTE — Telephone Encounter (Signed)
Patient states he would like to discuss a procedure that he heard about. He states he does not remember the name of it, but it is supposed to help with afib.

## 2020-08-31 NOTE — Telephone Encounter (Signed)
Agree likely referring to Watchman. Not a candidate since no contraindication to anticoagulation.

## 2020-08-31 NOTE — Telephone Encounter (Signed)
Patient states that his cousin is a doctor who told him about a "procedure that helps with the valves and Afib where you will not clot and do not have to have to take a blood thinner". Asked the patient if he was referring to the [facility] implant but he can not remember the name of the procedure at this time. He would like to know if Dr. Tamala Julian thinks he would be a canidate.

## 2020-08-31 NOTE — Telephone Encounter (Signed)
Informed the patient that Dr. Tamala Julian advised that he is not a candidate for the Watchman due to him not having any contraindication to anticoagulation.   Patient states he would like Dr. Tamala Julian to give him a call at his earliest convenience to discuss. Informed the patient that I would give Dr. Tamala Julian the message but he is currently not in the office so it may be several days before he receives a call back. He verbalized understanding.

## 2020-09-22 ENCOUNTER — Ambulatory Visit
Admission: RE | Admit: 2020-09-22 | Discharge: 2020-09-22 | Disposition: A | Discharge status: Home / Self Care | Payer: PPO | Source: Ambulatory Visit | Attending: Urology | Admitting: Urology

## 2020-09-22 ENCOUNTER — Other Ambulatory Visit: Payer: Self-pay

## 2020-09-22 DIAGNOSIS — M533 Sacrococcygeal disorders, not elsewhere classified: Secondary | ICD-10-CM | POA: Diagnosis not present

## 2020-09-22 DIAGNOSIS — R972 Elevated prostate specific antigen [PSA]: Secondary | ICD-10-CM | POA: Diagnosis not present

## 2020-09-22 DIAGNOSIS — N400 Enlarged prostate without lower urinary tract symptoms: Secondary | ICD-10-CM | POA: Diagnosis not present

## 2020-09-22 DIAGNOSIS — C61 Malignant neoplasm of prostate: Secondary | ICD-10-CM

## 2020-09-22 DIAGNOSIS — R935 Abnormal findings on diagnostic imaging of other abdominal regions, including retroperitoneum: Secondary | ICD-10-CM | POA: Diagnosis not present

## 2020-09-22 MED ORDER — GADOBENATE DIMEGLUMINE 529 MG/ML IV SOLN
20.0000 mL | Freq: Once | INTRAVENOUS | Status: AC | PRN
  Administered 2020-09-22: 20 mL via INTRAVENOUS

## 2020-09-24 ENCOUNTER — Telehealth: Payer: Self-pay

## 2020-09-24 NOTE — Telephone Encounter (Signed)
Called pt to advise the need for a awv. [place]

## 2020-10-02 ENCOUNTER — Encounter: Payer: Self-pay | Admitting: Family Medicine

## 2020-10-02 ENCOUNTER — Ambulatory Visit (INDEPENDENT_AMBULATORY_CARE_PROVIDER_SITE_OTHER): Payer: PPO | Admitting: Family Medicine

## 2020-10-02 ENCOUNTER — Other Ambulatory Visit: Payer: Self-pay

## 2020-10-02 VITALS — BP 122/76 | HR 68 | Temp 97.6°F | Wt 245.6 lb

## 2020-10-02 DIAGNOSIS — Z7901 Long term (current) use of anticoagulants: Secondary | ICD-10-CM | POA: Diagnosis not present

## 2020-10-02 DIAGNOSIS — E785 Hyperlipidemia, unspecified: Secondary | ICD-10-CM

## 2020-10-02 DIAGNOSIS — J4531 Mild persistent asthma with (acute) exacerbation: Secondary | ICD-10-CM

## 2020-10-02 DIAGNOSIS — I152 Hypertension secondary to endocrine disorders: Secondary | ICD-10-CM

## 2020-10-02 DIAGNOSIS — E1159 Type 2 diabetes mellitus with other circulatory complications: Secondary | ICD-10-CM

## 2020-10-02 DIAGNOSIS — Z23 Encounter for immunization: Secondary | ICD-10-CM | POA: Diagnosis not present

## 2020-10-02 DIAGNOSIS — I480 Paroxysmal atrial fibrillation: Secondary | ICD-10-CM | POA: Diagnosis not present

## 2020-10-02 DIAGNOSIS — F4320 Adjustment disorder, unspecified: Secondary | ICD-10-CM

## 2020-10-02 DIAGNOSIS — G4733 Obstructive sleep apnea (adult) (pediatric): Secondary | ICD-10-CM | POA: Diagnosis not present

## 2020-10-02 DIAGNOSIS — Z87442 Personal history of urinary calculi: Secondary | ICD-10-CM

## 2020-10-02 DIAGNOSIS — E118 Type 2 diabetes mellitus with unspecified complications: Secondary | ICD-10-CM | POA: Diagnosis not present

## 2020-10-02 DIAGNOSIS — E1169 Type 2 diabetes mellitus with other specified complication: Secondary | ICD-10-CM

## 2020-10-02 DIAGNOSIS — C61 Malignant neoplasm of prostate: Secondary | ICD-10-CM | POA: Diagnosis not present

## 2020-10-02 DIAGNOSIS — E669 Obesity, unspecified: Secondary | ICD-10-CM

## 2020-10-02 LAB — POCT GLYCOSYLATED HEMOGLOBIN (HGB A1C): Hemoglobin A1C: 7.6 % — AB (ref 4.0–5.6)

## 2020-10-02 MED ORDER — METFORMIN HCL 500 MG PO TABS: 500.0000 mg | ORAL_TABLET | Freq: Two times a day (BID) | ORAL | 3 refills | Status: DC

## 2020-10-02 MED ORDER — FLUTICASONE-SALMETEROL 250-50 MCG/DOSE IN AEPB: 1.0000 | INHALATION_SPRAY | Freq: Two times a day (BID) | RESPIRATORY_TRACT | 3 refills | Status: DC

## 2020-10-02 NOTE — Progress Notes (Signed)
Subjective:    Patient ID: Jeremy Tucker, male    DOB: 05/10/1951, 69 y.o.   MRN: [id number]  Jeremy Tucker is a 69 y.o. male who presents for follow-up of Type 2 diabetes mellitus.  Home blood sugar records: pt not checking Current symptoms/problems include none at this time. Daily foot checks:yes   Any foot concerns: none Exercise: staying active Diet:good He continues on Zoloft and Wellbutrin and seems to be doing quite nicely on it.  He has been on it for over a year and is very happy with that.  He is no longer using any sleep meds.  He continues on Eliquis for his underlying atrial fibrillation.  He has had no chest pain, shortness of breath, PND or DOE.  He is also taking diltiazem, metoprolol.  He continues on allopurinol to help with kidney stones.  He has been having difficulty with breathing requiring the use of his inhaler on a daily basis.  He has been using Asmanex as well.  He does have OSA but is not on CPAP.  He did not tolerate the mask.  He has underlying prostate cancer is in the process of getting this further evaluated for possible radiation.  He recognizes that he is not want to deal with surgery at the present time due to potential for erectile dysfunction. The following portions of the patient's history were reviewed and updated as appropriate: allergies, current medications, past medical history, past social history and problem list.  ROS as in subjective above.     Objective:    Physical Exam Alert and in no distress otherwise not examined.  Hemoglobin A1c is 7.6  Lab Review Diabetic Labs Latest Ref Rng & Units 01/28/2020 12/10/2019 09/14/2018 09/10/2018 02/19/2018  HbA1c 4.0 - 5.6 % 7.3(A) - - - -  Microalbumin mg/L 81.8 - - - -  Micro/Creat Ratio - 35.4 - - - -  Chol 100 - 199 mg/dL 195 - - - 194  HDL >39 mg/dL 44 - - - 45  Calc LDL 0 - 99 mg/dL 112(H) - - - 97  Triglycerides 0 - 149 mg/dL 223(H) - - - 261(H)  Creatinine 0.76 - 1.27 mg/dL - 0.84 0.89 0.78 -    GFR >60.00 mL/min - - - - -   BP/Weight 08/21/2020 08/03/2020 04/06/2020 04/02/2020 04/01/8415  Systolic BP 606 - 301 601 093  Diastolic BP 78 - 95 83 80  Wt. (Lbs) 243.8 233 233.9 236 240.8  BMI 33.07 31.6 31.72 32.01 32.66   Foot/eye exam completion dates 02/19/2018  Foot Form Completion Done    Ejay  reports that he has never smoked. He has never used smokeless tobacco. He reports current alcohol use. He reports that he does not use drugs.     Assessment & Plan:    Controlled type 2 diabetes mellitus with complication, without long-term current use of insulin ([place]) - Plan: POCT glycosylated hemoglobin (Hb A1C), metFORMIN (GLUCOPHAGE) 500 MG tablet, Flu Vaccine QUAD High Dose(Fluad)  Hyperlipidemia associated with type 2 diabetes mellitus (HCC)  Mild persistent asthma with acute exacerbation - Plan: Fluticasone-Salmeterol (ADVAIR DISKUS) 250-50 MCG/DOSE AEPB  Obesity (BMI 30-39.9)  Prostate cancer ([place])  Chronic anticoagulation  History of renal stone  Hypertension associated with diabetes ([place])  Paroxysmal atrial fibrillation ([place])  Need for influenza vaccination - Plan: CANCELED: Flu vaccine HIGH DOSE PF (Fluzone High dose)  Immunization, viral disease - Plan: Pfizer SARS-COV-2 Vaccine  OSA (obstructive sleep apnea)  Adjustment reaction of adult  life  I will add Metformin to his regimen to get his A1c under better control.  Also discussed his weight gain and strongly encouraged him to get back on his diet and exercise regimen. We will also switch him to Advair and have him stop Asmanex.  Explained that he needs to be using his rescue inhaler less than twice per week and if there is a question, he will call me. He is comfortable with not using a CPAP. He is doing well on his present psychotropic medication.  Discussed possibly stopping the medication next spring.  We will readdress the issue at that point. He will continue on his other medications. 1.

## 2020-10-12 DIAGNOSIS — C61 Malignant neoplasm of prostate: Secondary | ICD-10-CM | POA: Diagnosis not present

## 2020-10-15 ENCOUNTER — Encounter: Payer: Self-pay | Admitting: Family Medicine

## 2020-10-19 DIAGNOSIS — C61 Malignant neoplasm of prostate: Secondary | ICD-10-CM | POA: Diagnosis not present

## 2020-10-20 ENCOUNTER — Ambulatory Visit: Payer: PPO | Admitting: Family Medicine

## 2020-10-20 DIAGNOSIS — L720 Epidermal cyst: Secondary | ICD-10-CM | POA: Diagnosis not present

## 2020-10-20 DIAGNOSIS — L080 Pyoderma: Secondary | ICD-10-CM | POA: Diagnosis not present

## 2020-10-30 ENCOUNTER — Encounter: Payer: Self-pay | Admitting: Family Medicine

## 2020-12-02 ENCOUNTER — Telehealth: Payer: Self-pay | Admitting: *Deleted

## 2020-12-02 DIAGNOSIS — K420 Umbilical hernia with obstruction, without gangrene: Secondary | ICD-10-CM | POA: Diagnosis not present

## 2020-12-02 NOTE — Telephone Encounter (Signed)
   [facility] Pre-operative Risk Assessment    HEARTCARE STAFF: - Please ensure there is not already an duplicate clearance open for this procedure. - Under Visit Info/Reason for Call, type in Other and utilize the format Clearance MM/DD/YY or Clearance TBD. Do not use dashes or single digits. - If request is for dental extraction, please clarify the # of teeth to be extracted.  Request for surgical clearance:  1. What type of surgery is being performed? HERNIA REPAIR   2. When is this surgery scheduled? TBD   3. What type of clearance is required (medical clearance vs. Pharmacy clearance to hold med vs. Both)? BOTH  4. Are there any medications that need to be held prior to surgery and how long? ELIQUIS  X 2 DAYS PRIOR TO PROCEDURE   5. Practice name and name of physician performing surgery? [facility]; DR. Grandville Silos   6. What is the office phone number? [phone]   7.   What is the office fax number? Elaine: [place], CMA  8.   Anesthesia type (None, local, MAC, general) ? GENERAL   Julaine Hua 12/02/2020, 12:12 PM  _________________________________________________________________   (provider comments below)

## 2020-12-03 NOTE — Telephone Encounter (Signed)
Patient with diagnosis of afib on Eliquis for anticoagulation.    Procedure: HERNIA REPAIR  Date of procedure: TBD    CHA2DS2-VASc Score = 3  This indicates a 3.2% annual risk of stroke. The patient's score is based upon: CHF History: No HTN History: Yes Diabetes History: Yes Stroke History: No Vascular Disease History: No Age Score: 1 Gender Score: 0     Of note patient does have a hx of DVT after knee surgery  CrCl 105 ml/min  Per office protocol, patient can hold Eliquis for 2 days prior to procedure.

## 2020-12-04 NOTE — Telephone Encounter (Signed)
Left message to call back at 803 AM 12/04/2020

## 2020-12-04 NOTE — Telephone Encounter (Signed)
   Primary Cardiologist: [name], MD  Chart reviewed as part of pre-operative protocol coverage. Given past medical history and time since last visit, based on ACC/AHA guidelines, Jeremy Tucker would be at acceptable risk for the planned procedure without further cardiovascular testing.   Patient was advised that if he develops new symptoms prior to surgery to contact our office to arrange a follow-up appointment.  He verbalized understanding.  Patient with diagnosis of afib on Eliquis for anticoagulation.    Procedure: HERNIA REPAIR Date of procedure: TBD    CHA2DS2-VASc Score = 3  This indicates a 3.2% annual risk of stroke. The patient's score is based upon: CHF History: No HTN History: Yes Diabetes History: Yes Stroke History: No Vascular Disease History: No Age Score: 1 Gender Score: 0     Of note patient does have a hx of DVT after knee surgery  CrCl 105 ml/min  Per office protocol, patient can hold Eliquis for 2 days prior to procedure.  I will route this recommendation to the requesting party via Epic fax function and remove from pre-op pool.  Please call with questions.  [name]. Miraj Truss NP-C    12/04/2020, 12:58 PM [place] [address] Office [phone] Fax [phone]

## 2020-12-04 NOTE — Telephone Encounter (Signed)
Patient returning call.

## 2020-12-07 DIAGNOSIS — C61 Malignant neoplasm of prostate: Secondary | ICD-10-CM | POA: Diagnosis not present

## 2020-12-14 ENCOUNTER — Telehealth: Payer: Self-pay | Admitting: Radiation Oncology

## 2020-12-14 ENCOUNTER — Encounter: Payer: Self-pay | Admitting: Radiation Oncology

## 2020-12-14 DIAGNOSIS — N201 Calculus of ureter: Secondary | ICD-10-CM | POA: Insufficient documentation

## 2020-12-14 NOTE — Progress Notes (Signed)
GU Location of Tumor / Histology: prostatic adenocarcinoma  If Prostate Cancer, Gleason Score is (3 + 4) and PSA is (6.02). Prostate volume: 83 grams.  Biopsies of prostate (if applicable) revealed:   Past/Anticipated interventions by urology, if any: prostate biopsy, prescribed tamsulosin, started finasteride on 12/07/20, referred to Dr. Tammi Klippel for consideration of radiation therapy  Past/Anticipated interventions by medical oncology, if any: no  Weight changes, if any: no  Bowel/Bladder complaints, if any: IPSS 6. SHIM 23 with aid of Viagra. Reports nocturia. Denies dysuria, hematuria, urinary leakage or incontinence. Denies any bowel complaints.  Nausea/Vomiting, if any: denies  Pain issues, if any:  denies  SAFETY ISSUES:  Prior radiation? denies  Pacemaker/ICD? Yes, loop recorder  Possible current pregnancy? no, male patient  Is the patient on methotrexate? no  Current Complaints / other details:  70 year old male. Married with two sons but one passed in an automobile accident. Resides in [place].

## 2020-12-14 NOTE — Telephone Encounter (Signed)
Received voicemail message from patient requesting return call. Phoned patient back. Patient understood his consultation appointment was scheduled for today at 0800. Explained his appointment is tomorrow. Patient confirmed appointment for tomorrow. All questions answered to the best of my ability. Patient verbalized understanding and appreciation for the call.

## 2020-12-15 ENCOUNTER — Encounter: Payer: Self-pay | Admitting: Radiation Oncology

## 2020-12-15 ENCOUNTER — Ambulatory Visit
Admission: RE | Admit: 2020-12-15 | Discharge: 2020-12-15 | Disposition: A | Discharge status: Home / Self Care | Payer: PPO | Source: Ambulatory Visit | Attending: Radiation Oncology | Admitting: Radiation Oncology

## 2020-12-15 ENCOUNTER — Other Ambulatory Visit: Payer: Self-pay

## 2020-12-15 VITALS — Ht 72.0 in | Wt 239.0 lb

## 2020-12-15 DIAGNOSIS — C61 Malignant neoplasm of prostate: Secondary | ICD-10-CM | POA: Diagnosis not present

## 2020-12-15 DIAGNOSIS — R972 Elevated prostate specific antigen [PSA]: Secondary | ICD-10-CM | POA: Diagnosis not present

## 2020-12-15 HISTORY — DX: Malignant neoplasm of prostate: C61

## 2020-12-15 NOTE — Progress Notes (Signed)
Radiation Oncology         [phone] ________________________________  Initial Outpatient Consultation - Conducted via Telephone due to current COVID-19 concerns for limiting patient exposure  Name: Jeremy Tucker MRN: [id number]  Date: 12/15/2020  DOB: December 17, 1950  [id number], [name], MD  Franchot Gallo, MD   REFERRING PHYSICIAN: Franchot Gallo, MD  DIAGNOSIS: 70 y.o. gentleman with Stage T1c adenocarcinoma of the prostate with Gleason score of 3+4, and PSA of 6.95.    ICD-10-CM   1. Malignant neoplasm of prostate ([place])  C61     HISTORY OF PRESENT ILLNESS: Jeremy Tucker is a 70 y.o. male with a diagnosis of prostate cancer. He is an established urology patient with a history of BPH with BOO and nephrolithiasis, having undergone cystouretero lithotripsy and stent placement in 04/2015 under the care of Dr. Gaynelle Arabian. His LUTS have been managed with Flomax and his PSA has slowly risen over the years, initially reaching the 4 range in 01/2018 and gradually rising since that time, most recently reaching 6.69 in 11/2019.  Prior PSAs were 4.18 in September 2019 and 4.49 in July 2020.  The patient proceeded to transrectal ultrasound with 12 biopsies of the prostate on 01/24/2020 under the care of Dr. Diona Fanti.  The prostate volume measured 68.16 cc.  Out of 12 core biopsies, 3 were positive.  The maximum Gleason score was 3+4, and this was seen in the right mid. Additionally, Gleason 3+3 was seen in the right apex lateral and right apex.  Genomic testing was performed on the biopsy sample showing a score of 25, indicating favorable lower risk disease. In light of this, they opted to proceed with active surveillance.  He underwent a surveillance prostate MRI on 09/22/2020 which showed a small, PI-RADS 4 lesion in the right posterolateral peripheral zone lesion at the apex and a PI-RADS 3 lesion in the right anterior transition zone at the base and mid gland with a thickened right neurovascular  bundle as compared to left, suspicious for possible right neurovascular bundle involvement.  The prostate volume was estimated to be 82.81 cc.  A repeat PSA on 10/19/2020 remained stable elevated at 6.95. His digital rectal examination has also remained benign. He proceeded to surveillance MRI fusion biopsy on 10/19/2020. Out of 20 core samples, 8 were positive. The maximum Gleason score was 3+4, and this was seen in two of the 8 ROI MRI lesion samples (one from the right apex, one from the right base/mid). Additionally, Gleason 3+3 was seen in the remaining three right apex ROI MRI lesion samples, two of the three additional right base/mid ROI MRI lesion samples, and a small focus in the right apex.  He was started on finasteride on 12/07/2020, in addition to his daily Flomax, in an effort to potentially shrink the prostate in consideration of brachytherapy for management of his disease.  The patient reviewed the biopsy results with his urologist and he has kindly been referred today for discussion of potential radiation treatment options.   PREVIOUS RADIATION THERAPY: No  PAST MEDICAL HISTORY:  Past Medical History:  Diagnosis Date  . Aortic stenosis, mild   . Atrial fibrillation ([place])   . BPH (benign prostatic hyperplasia)   . Bronchitis    patient mentions " i am prone to bronchitis"   . Controlled diabetes mellitus type 2 with complications ([place])   . Depression   . Diabetic peripheral neuropathy ([place]) 01/07/2019  . DJD (degenerative joint disease)   . DVT (deep venous thrombosis) (  [place])    post op knee surgery in 2001 , behind calf  ; was resolved with blood thinners   . Dysrhythmia    Atrial fibrillation  . Gastric polyp 08/02/2018   see report in chart  . GERD (gastroesophageal reflux disease)   . Glucose intolerance (impaired glucose tolerance)   . Gout    denies  . Hiatal hernia 08/02/2018   see report in chart  . Hyperlipidemia   . Hyperlipidemia associated with type 2 diabetes  mellitus ([place])   . Hypertension    denies  . Hypertension associated with diabetes ([place])   . Kidney stones   . Obesity   . 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 Persistent atrial fibrillation ([place])    a. CHADsVASc score at least 2  . Prostate cancer ([place])   . Right carpal tunnel syndrome 01/07/2019  . Status post placement of implantable loop recorder 2 years ago ; 2016   left upper chest       PAST SURGICAL HISTORY: Past Surgical History:  Procedure Laterality Date  . ATRIAL FIBRILLATION ABLATION N/A 03/12/2015   Procedure: ATRIAL FIBRILLATION ABLATION;  Surgeon: [name], MD;  Location: [facility] C[facility] LAB;  Service: Cardiovascular;  Laterality: N/A;  . CARDIAC CATHETERIZATION  1990's   "Dr. Melvern Banker", no blockages per pt  . CARDIOVERSION N/A 02/25/2015   Procedure: CARDIOVERSION;  Surgeon: Sueanne Margarita, MD;  Location: [facility];  Service: Cardiovascular;  Laterality: N/A;  . COLONOSCOPY  2010   [facility]  . CYSTOSCOPY W/ STONE MANIPULATION  1980's   "couldn't get stone so they had to cut me open"  . CYSTOSCOPY WITH RETROGRADE PYELOGRAM, URETEROSCOPY AND STENT PLACEMENT Left 04/24/2015   Procedure: URETHRAL MEATAL DILATION, CYSTOSCOPY WITH LEFT RETROGRADE PYELOGRAM, LEFT URETEROSCOPY, STONE BASKET EXTRACTION,  LEFT DOUBLE J STENT PLACEMENT;  Surgeon: Carolan Clines, MD;  Location: [facility];  Service: Urology;  Laterality: Left;  . EP IMPLANTABLE DEVICE N/A 09/15/2015   Procedure: Loop Recorder Insertion;  Surgeon: [name], MD;  Location: [facility] CV LAB;  Service: Cardiovascular;  Laterality: N/A;  . EXTRACORPOREAL SHOCK WAVE LITHOTRIPSY Bilateral 04/02/2020   Procedure: EXTRACORPOREAL SHOCK WAVE LITHOTRIPSY (ESWL);  Surgeon: Cleon Gustin, MD;  Location: [facility];  Service: Urology;  Laterality: Bilateral;  . EXTRACORPOREAL SHOCK WAVE LITHOTRIPSY Left 04/06/2020   Procedure: EXTRACORPOREAL SHOCK WAVE LITHOTRIPSY (ESWL);  Surgeon: [name], MD;  Location: [facility];  Service: Urology;  Laterality: Left;  . EYE SURGERY Bilateral 2016   cataract surgery with implant ; dr Gerald Stabs grote   . FOREARM FRACTURE SURGERY Right ~ 1961  . FRACTURE SURGERY    . HOLMIUM LASER APPLICATION Left [id number]   Procedure: HOLMIUM LASER APPLICATION;  Surgeon: Carolan Clines, MD;  Location: [facility];  Service: Urology;  Laterality: Left;  . JOINT REPLACEMENT    . KIDNEY STONE SURGERY  1980's   "stone lodged in the duct; had to cut me open to get it"  . KNEE ARTHROSCOPY Bilateral "multiple times"  . LEFT HEART CATH AND CORONARY ANGIOGRAPHY N/A 09/19/2018   Procedure: LEFT HEART CATH AND CORONARY ANGIOGRAPHY;  Surgeon: Belva Crome, MD;  Location: [facility] CV LAB;  Service: Cardiovascular;  Laterality: N/A;  . SKIN BIOPSY Left 05/06/2020   Shave biopsy left posterior hand neurofiberoma.  . TEE WITHOUT CARDIOVERSION N/A 03/12/2015   Procedure: TRANSESOPHAGEAL ECHOCARDIOGRAM (TEE);  Surgeon: Pixie Casino, MD;  Location: [place] [facility];  Service: Cardiovascular;  Laterality: N/A;  . TONSILLECTOMY    . TOTAL KNEE ARTHROPLASTY Left 2001   [facility]  . TOTAL KNEE ARTHROPLASTY Right 10/30/2017   Procedure: RIGHT TOTAL KNEE ARTHROPLASTY;  Surgeon: Paralee Cancel, MD;  Location: [facility];  Service: Orthopedics;  Laterality: Right;  90 mins    FAMILY HISTORY:  Family History  Problem Relation Age of Onset  . Heart attack Mother 4       MI  . Heart attack Father 31       ? MI versus trauma to head  . Healthy Brother   . Cancer Maternal Grandfather        had prostate removed  . Breast cancer Neg Hx   . Colon cancer Neg Hx   . Pancreatic cancer Neg Hx   . Prostate cancer Neg Hx     SOCIAL HISTORY:  Social History   Socioeconomic History  . Marital status: Married    Spouse name: Not on file  . Number of children: 1  . Years of education: Not on file  . Highest education level: Some college, no degree   Occupational History  . Occupation: Retired  Tobacco Use  . Smoking status: Never Smoker  . Smokeless tobacco: Never Used  Vaping Use  . Vaping Use: Never used  Substance and Sexual Activity  . Alcohol use: Yes    Comment: seldom   . Drug use: No  . Sexual activity: Yes  Other Topics Concern  . Not on file  Social History Narrative   Pt lives in [place] with spouse.  67 yo son is health.  Second son died in car accident.      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  MEDICATIONS:  Current Outpatient Medications  Medication Sig Dispense Refill  . acetaminophen (TYLENOL) 500 MG tablet Take 1,000 mg by mouth every 8 (eight) hours as needed for mild pain.     Marland Kitchen albuterol (VENTOLIN HFA) 108 (90 Base) MCG/ACT inhaler INHALE 2 PUFFS INTO THE LUNGS EVERY 6 HOURS AS NEEDED FOR WHEEZING OR SHORTNESS OF BREATH 54 g 0  . allopurinol (ZYLOPRIM) 300 MG tablet Take 1 tablet (300 mg total) by mouth daily. 90 tablet 3  . ammonium lactate (LAC-HYDRIN) 12 % lotion Apply 1 application topically daily as needed for dry skin (3-4 TIMES WEEK).     Marland Kitchen atorvastatin (LIPITOR) 20 MG tablet     . buPROPion (WELLBUTRIN XL) 300 MG 24 hr tablet Take 150 mg by mouth daily.    Marland Kitchen diltiazem (TIAZAC) 180 MG 24 hr capsule Take 1 capsule (180 mg total) by mouth daily. 90 capsule 3  . doxycycline (VIBRAMYCIN) 100 MG capsule Take 1 pill by mouth twice a day with a meal    . ELIQUIS 5 MG TABS tablet TAKE ONE TABLET BY MOUTH TWICE A DAY 180 tablet 1  . finasteride (PROSCAR) 5 MG tablet Take 5 mg by mouth daily.    . fluticasone (FLONASE) 50 MCG/ACT nasal spray 2  SPRAYS NASALLY DAILY    . Fluticasone-Salmeterol (ADVAIR DISKUS) 250-50 MCG/DOSE AEPB Inhale 1 puff into the  lungs 2 (two) times daily. 1 each 3  . loratadine (CLARITIN) 10 MG tablet Take 10 mg by mouth daily.    Marland Kitchen LORazepam (ATIVAN) 0.5 MG tablet TAKE 1 TABLET TWICE DAILY AS NEEDED FOR ANXIETY. 20 tablet 0  . metFORMIN (GLUCOPHAGE) 500 MG tablet Take 1 tablet (500 mg total) by mouth 2 (two) times daily with a meal. 180 tablet 3  . metoprolol succinate (TOPROL-XL) 25 MG 24 hr tablet TAKE 1 TABLET BY MOUTH DAILY IN THE EVENING. 90 tablet 3  . Multiple Vitamins-Minerals (MULTIVITAMIN WITH MINERALS) tablet Take 1 tablet by mouth daily.    . mupirocin ointment (BACTROBAN) 2 % Apply to buttock after each dressing    . olopatadine (PATANOL) 0.1 % ophthalmic solution Place 1 drop into both eyes daily as needed for allergies (tearing).     . Omega-3 Fatty Acids (FISH OIL TRIPLE STRENGTH) 1400 MG CAPS Take 1,400 mg by mouth daily.     Marland Kitchen omeprazole (PRILOSEC) 20 MG capsule Take 20 mg by mouth daily.    . sertraline (ZOLOFT) 50 MG tablet TAKE ONE TABLET BY MOUTH DAILY 90 tablet 0  . sildenafil (VIAGRA) 100 MG tablet Take 100 mg by mouth daily as needed for erectile dysfunction.    . tamsulosin (FLOMAX) 0.4 MG CAPS capsule Take 0.4 mg by mouth daily.      No current facility-administered medications for this encounter.    REVIEW OF SYSTEMS:  On review of systems, the patient reports that he is doing well overall. He denies any chest pain, shortness of breath, cough, fevers, chills, night sweats, unintended weight changes. He denies any bowel disturbances, and denies abdominal pain, nausea or vomiting. He denies any new musculoskeletal or joint aches or pains. His IPSS was 6, indicating mild urinary symptoms on Flomax and finasteride daily. His main complaint is nocturia. His SHIM was 23 with aid the of Viagra, indicating he has well-controlled erectile dysfunction. A complete review of systems is obtained and  is otherwise negative.    PHYSICAL EXAM:  Wt Readings from Last 3 Encounters:  12/15/20 239 lb (108.4 kg)  10/02/20 245 lb 9.6 oz (111.4 kg)  08/21/20 243 lb 12.8 oz (110.6 kg)   Temp Readings from Last 3 Encounters:  10/02/20 97.6 F (36.4 C)  08/03/20 98 F (36.7 C)  04/06/20 (!) 97.3 F (36.3 C)   BP Readings from Last 3 Encounters:  10/02/20 122/76  08/21/20 132/78  04/06/20 (!) 143/95   Pulse Readings from Last 3 Encounters:  10/02/20 68  08/21/20 74  04/06/20 64   Pain Assessment Pain Score: 0-No pain/10  Physical exam not performed in light of telephone consult visit format.   KPS = 100  100 - Normal; no complaints; no evidence of disease. 90   - Able to carry on normal activity; minor signs or symptoms of disease. 80   - Normal activity with effort; some signs or symptoms of disease. 4   - Cares for self; unable to carry on normal activity or to do active work. 60   - Requires occasional assistance, but is able to care for most of his personal needs. 50   - Requires considerable assistance and frequent medical care. 77   - Disabled; requires special care and assistance. 61   - Severely disabled; hospital admission is indicated although death not imminent. 17   - Very sick; hospital admission necessary; active supportive treatment necessary. 10   - Moribund; fatal processes progressing rapidly. 0     -  Dead  Karnofsky DA, Abelmann [place], Craver LS and Burchenal [facility] [phone]) The use of the nitrogen mustards in the palliative treatment of carcinoma: with particular reference to bronchogenic carcinoma Cancer 1 634-56  LABORATORY DATA:  Lab Results  Component Value Date   WBC 9.5 01/28/2020   HGB 15.4 01/28/2020   HCT 45.5 01/28/2020   MCV 94 01/28/2020   PLT 244 01/28/2020   Lab Results  Component Value Date   NA 142 12/10/2019   K 4.5 12/10/2019   CL 101 12/10/2019   CO2 24 12/10/2019   Lab Results  Component Value Date   ALT 48 (H) 08/16/2016   AST  37 (H) 08/16/2016   ALKPHOS 77 08/16/2016   BILITOT 0.5 08/16/2016     RADIOGRAPHY: No results found.    IMPRESSION/PLAN: This visit was conducted via Telephone to spare the patient unnecessary potential exposure in the healthcare setting during the current COVID-19 pandemic. 1. 70 y.o. gentleman with Stage T1c adenocarcinoma of the prostate with Gleason Score of 3+4, and PSA of 6.95. We discussed the patient's workup and outlined the nature of prostate cancer in this setting. The patient's T stage, Gleason's score, and PSA put him into the favorable intermediate risk group. Accordingly, he is eligible for a variety of potential treatment options including brachytherapy, 5.5 weeks of external radiation, or prostatectomy. We discussed the available radiation techniques, and focused on the details and logistics and delivery. The patient may not be an ideal candidate for brachytherapy with a prostate volume of 82 cc prior to downsizing though he does have well managed LUTS on Flomax. We discussed that based on his prostate volume, he would require treatment with a 5 alpha reductase inhibitor, which he has already started on 12/07/2020, for at least 3 months, to allow for downsizing of the prostate prior to brachytherapy. We discussed and outlined the risks, benefits, short and long-term effects associated with radiotherapy and compared and contrasted these with prostatectomy. We discussed the role of SpaceOAR in reducing the rectal toxicity associated with radiotherapy. He appears to have a good understanding of his disease and our treatment recommendations which are of curative intent.  He was encouraged to ask questions that were answered to his stated satisfaction.  At the end of the conversation, the patient is interested in moving forward with brachytherapy and use of SpaceOAR gel to reduce rectal toxicity from radiotherapy.  We will share our discussion with Dr. Diona Fanti and move forward with  scheduling his [facility] planning appointment in late April or early May 2022 to allow adequate time for the 5-ARI medication to downsize the prostate prior to his procedure.  The patient will be contacted by Romie Jumper in our office who will be working closely with him to coordinate OR scheduling and pre and post procedure appointments.  We will contact the pharmaceutical rep to ensure that [place] is available at the time of procedure.  We enjoyed meeting him today and look forward to continuing to participate in his care.  Given current concerns for patient exposure during the COVID-19 pandemic, this encounter was conducted via telephone. The patient was notified in advance and was offered a MyChart meeting to allow for face to face communication but unfortunately reported that he did not have the appropriate resources/technology to support such a visit and instead preferred to proceed with telephone consult. The patient has given verbal consent for this type of encounter. The time spent during this encounter was 45 minutes. The attendants for  this meeting include Tyler Pita MD, Ashlyn Bruning PA-C, [place], and patient, Jeremy Tucker. During the encounter, Tyler Pita MD, Ashlyn Bruning PA-C, and scribe, Wilburn Mylar were located at [place].  Patient, Jeremy Tucker was located at home.    Nicholos Johns, PA-C    Tyler Pita, MD  [facility] Direct Dial: [phone]  Fax: [phone] [email or web address]  Skype  [email or web address]  This document serves as a record of services personally performed by Tyler Pita, MD and Freeman Caldron, PA-C. It was created on their behalf by Wilburn Mylar, a trained medical scribe. The creation of this record is based on the scribe's personal observations and the provider's statements to them. This document has been checked and approved by the attending  provider.

## 2020-12-29 ENCOUNTER — Telehealth: Payer: Self-pay | Admitting: *Deleted

## 2020-12-29 NOTE — Telephone Encounter (Signed)
Called patient to inform of implant and space oar placement, spoke with patient and he is aware of this procedure.

## 2020-12-30 ENCOUNTER — Telehealth: Payer: Self-pay | Admitting: Interventional Cardiology

## 2020-12-30 NOTE — Telephone Encounter (Signed)
Pt has f/u echocardiogram scheduled 3/2. Date of surgery is 4/25. He is due for f/u with Dr. Tamala Julian in 02/2021. Please see if we can get his OV scheduled with Dr. Tamala Julian sometime b/t his echocardiogram on 3/2 and the first week of April. If that is possible, will ask Dr. Tamala Julian to give clearance rec's at that appt. If we cannot arrange, preop can call pt after echocardiogram done. Richardson Dopp, PA-C    12/30/2020 12:07 PM

## 2020-12-30 NOTE — Telephone Encounter (Signed)
   [facility] Pre-operative Risk Assessment    HEARTCARE STAFF: - Please ensure there is not already an duplicate clearance open for this procedure. - Under Visit Info/Reason for Call, type in Other and utilize the format Clearance MM/DD/YY or Clearance TBD. Do not use dashes or single digits. - If request is for dental extraction, please clarify the # of teeth to be extracted.  Request for surgical clearance:  1. What type of surgery is being performed?   RADIOACTIVE SEED IMPLANT/BRACHYTHERAPY IMPLANT  2. When is this surgery scheduled? 03/08/2021  3. What type of clearance is required (medical clearance vs. Pharmacy clearance to hold med vs. Both)? Both   4. Are there any medications that need to be held prior to surgery and how long? Eliquis stop 48 before surgery   5. Practice name and name of physician performing surgery? [facility]    6. What is the office phone number? [phone] ext [phone]   7.   What is the office fax number? [phone]  8.   Anesthesia type (None, local, MAC, general) ? General    Jeremy Tucker 12/30/2020, 10:31 AM  _________________________________________________________________   (provider comments below)

## 2020-12-30 NOTE — Telephone Encounter (Signed)
Pt has been scheduled to see Melina Copa, [facility] 02/15/21 @ 8:45 for pre op clearance , same day Dr. Tamala Julian is in the office. Confirmed appt for echo is on 01/13/21 @ 11:15 with Will, and his appt with Melina Copa, [facility] is on April 4th @ 8:45. Pt thanked me for the call and the help. I will send notes to [facility] for upcoming appt and send FYI to requesting office pt has testing and f/u with for pre op assessment.

## 2020-12-30 NOTE — Telephone Encounter (Signed)
Patient with diagnosis of afib on Eliquis for anticoagulation.    Procedure: RADIOACTIVE SEED IMPLANT/BRACHYTHERAPY IMPLANT Date of procedure: 03/08/21  CHA2DS2-VASc Score = 3  This indicates a 3.2% annual risk of stroke. The patient's score is based upon: CHF History: No HTN History: Yes Diabetes History: Yes Stroke History: No Vascular Disease History: No Age Score: 1 Gender Score: 0     Of note patient does have a hx of DVT after knee surgery  CrCl 105 ml/min  Per office protocol, patient can hold Elqiuis for 2 days prior to procedure.

## 2020-12-30 NOTE — Telephone Encounter (Signed)
Will route to PharmD for rec's re: holding anticoagulation. Richardson Dopp, PA-C    12/30/2020 12:05 PM

## 2021-01-04 ENCOUNTER — Other Ambulatory Visit: Payer: Self-pay | Admitting: Family Medicine

## 2021-01-04 DIAGNOSIS — I480 Paroxysmal atrial fibrillation: Secondary | ICD-10-CM

## 2021-01-06 ENCOUNTER — Other Ambulatory Visit: Payer: Self-pay | Admitting: Urology

## 2021-01-13 ENCOUNTER — Other Ambulatory Visit: Payer: Self-pay

## 2021-01-13 ENCOUNTER — Ambulatory Visit (HOSPITAL_COMMUNITY): Payer: PPO | Attending: Cardiology

## 2021-01-13 DIAGNOSIS — I350 Nonrheumatic aortic (valve) stenosis: Secondary | ICD-10-CM | POA: Diagnosis not present

## 2021-01-13 LAB — ECHOCARDIOGRAM COMPLETE
AR max vel: 1.29 cm2
AV Area VTI: 1.18 cm2
AV Area mean vel: 1.19 cm2
AV Mean grad: 31.6 mmHg
AV Peak grad: 55.8 mmHg
Ao pk vel: 3.73 m/s
Area-P 1/2: 3.54 cm2
S' Lateral: 3 cm

## 2021-01-14 ENCOUNTER — Telehealth: Payer: Self-pay | Admitting: *Deleted

## 2021-01-14 DIAGNOSIS — I350 Nonrheumatic aortic (valve) stenosis: Secondary | ICD-10-CM

## 2021-01-14 NOTE — Telephone Encounter (Signed)
Spoke with pt and reviewed results and recommendations.  Pt agreeable to plan.

## 2021-01-14 NOTE — Telephone Encounter (Signed)
-----   Message from Belva Crome, MD sent at 01/14/2021  7:00 AM EST ----- Let the patient know the valve is slowly progressing and is just below criteria for severe stenosis. Needs repeat echo in 6 months. How is he feeling? Are there any symptoms of CHF/Angina/Syncope? A copy will be sent to Denita Lung, MD

## 2021-02-07 ENCOUNTER — Other Ambulatory Visit: Payer: Self-pay | Admitting: Family Medicine

## 2021-02-07 DIAGNOSIS — I350 Nonrheumatic aortic (valve) stenosis: Secondary | ICD-10-CM

## 2021-02-08 NOTE — Telephone Encounter (Signed)
[facility] is requesting to fill pt wellbutrin. Please advise [facility]

## 2021-02-10 ENCOUNTER — Telehealth: Payer: Self-pay | Admitting: *Deleted

## 2021-02-10 NOTE — Telephone Encounter (Signed)
CALLED PATIENT TO REMIND OF PRE-SEED APPTS. FOR 02-11-21, SPOKE WITH PATIENT AND HE IS AWARE OF THESE APPTS.

## 2021-02-10 NOTE — Progress Notes (Signed)
  Radiation Oncology         [phone] ________________________________  Name: SUMEET GETER MRN: [id number]  Date: 02/11/2021  DOB: [birth year]  SIMULATION AND TREATMENT PLANNING NOTE PUBIC ARCH STUDY  [id number], [name], MD  Franchot Gallo, MD  DIAGNOSIS: 70 y.o. gentleman with Stage T1c adenocarcinoma of the prostate with Gleason score of 3+4, and PSA of 6.95.  Oncology History  Malignant neoplasm of prostate ([place])  01/31/2020 Initial Diagnosis   Prostate cancer ([place])   10/19/2020 Cancer Staging   Staging form: Prostate, AJCC 8th Edition - Clinical stage from 10/19/2020: Stage IIB (cT1c, cN0, cM0, PSA: 7, Grade Group: 2) - Signed by Freeman Caldron, PA-C on 12/15/2020 Histopathologic type: Adenocarcinoma, NOS Stage prefix: Initial diagnosis Prostate specific antigen (PSA) range: Less than 10 Gleason primary pattern: 3 Gleason secondary pattern: 4 Gleason score: 7 Histologic grading system: 5 grade system Number of biopsy cores examined: 18 Number of biopsy cores positive: 8 Location of positive needle core biopsies: One side       ICD-10-CM   1. Malignant neoplasm of prostate ([place])  C61     COMPLEX SIMULATION:  The patient presented today for evaluation for possible prostate seed implant. He was brought to the radiation planning suite and placed supine on the CT couch. A 3-dimensional image study set was obtained in upload to the planning computer. There, on each axial slice, I contoured the prostate gland. Then, using three-dimensional radiation planning tools I reconstructed the prostate in view of the structures from the transperineal needle pathway to assess for possible pubic arch interference. In doing so, I did not appreciate any pubic arch interference. Also, the patient's prostate volume was estimated based on the drawn structure. The volume was 65 cc.  Given the pubic arch appearance and prostate volume, patient remains a good candidate to proceed with prostate seed  implant. Today, he freely provided informed written consent to proceed.    PLAN: The patient will undergo prostate seed implant.   ________________________________  Sheral Apley. Tammi Klippel, M.D.

## 2021-02-11 ENCOUNTER — Encounter (HOSPITAL_COMMUNITY): Admission: RE | Admit: 2021-02-11 | Payer: PPO | Source: Ambulatory Visit

## 2021-02-11 ENCOUNTER — Encounter: Payer: Self-pay | Admitting: Medical Oncology

## 2021-02-11 ENCOUNTER — Other Ambulatory Visit: Payer: Self-pay

## 2021-02-11 ENCOUNTER — Ambulatory Visit
Admission: RE | Admit: 2021-02-11 | Discharge: 2021-02-11 | Disposition: A | Discharge status: Home / Self Care | Payer: PPO | Source: Ambulatory Visit | Attending: Urology | Admitting: Urology

## 2021-02-11 ENCOUNTER — Telehealth: Payer: Self-pay

## 2021-02-11 ENCOUNTER — Ambulatory Visit
Admission: RE | Admit: 2021-02-11 | Discharge: 2021-02-11 | Disposition: A | Discharge status: Home / Self Care | Payer: PPO | Source: Ambulatory Visit | Attending: Radiation Oncology | Admitting: Radiation Oncology

## 2021-02-11 DIAGNOSIS — C61 Malignant neoplasm of prostate: Secondary | ICD-10-CM | POA: Diagnosis not present

## 2021-02-11 NOTE — Telephone Encounter (Signed)
Received fax from [facility] for a refill on the pts. Diltiazem pt. Last apt was 10/02/20.

## 2021-02-12 ENCOUNTER — Ambulatory Visit (HOSPITAL_COMMUNITY)
Admission: RE | Admit: 2021-02-12 | Discharge: 2021-02-12 | Disposition: A | Discharge status: Home / Self Care | Payer: PPO | Source: Ambulatory Visit | Attending: Urology | Admitting: Urology

## 2021-02-12 ENCOUNTER — Other Ambulatory Visit: Payer: Self-pay

## 2021-02-12 ENCOUNTER — Encounter (HOSPITAL_COMMUNITY)
Admission: RE | Admit: 2021-02-12 | Discharge: 2021-02-12 | Disposition: A | Discharge status: Home / Self Care | Payer: PPO | Source: Ambulatory Visit | Attending: Urology | Admitting: Urology

## 2021-02-12 DIAGNOSIS — Z01818 Encounter for other preprocedural examination: Secondary | ICD-10-CM

## 2021-02-12 DIAGNOSIS — I480 Paroxysmal atrial fibrillation: Secondary | ICD-10-CM

## 2021-02-12 MED ORDER — DILTIAZEM HCL ER BEADS 180 MG PO CP24: 180.0000 mg | ORAL_CAPSULE | Freq: Every day | ORAL | 0 refills | Status: DC

## 2021-02-12 NOTE — Telephone Encounter (Signed)
Sent to [facility]. Jeremy Tucker

## 2021-02-12 NOTE — Telephone Encounter (Signed)
Please advise if this can be filled . It appears it is not be covered . Plus has not been in for this in a while. Please advise if ok to fill. Jeremy Tucker

## 2021-02-13 ENCOUNTER — Encounter: Payer: Self-pay | Admitting: Physician Assistant

## 2021-02-13 NOTE — Progress Notes (Addendum)
Cardiology Office Note    Date:  02/15/2021   ID:  Jeremy Tucker, DOB 09-18-1951, MRN [id number]  PCP:  Denita Lung, MD  Cardiologist:  [name], MD  Electrophysiologist:  [name], MD   Chief Complaint: pre-op cardiovascular examination  History of Present Illness:   Jeremy Tucker is a 70 y.o. male with history of moderate to severe aortic stenosis, paroxysmal atrial fib (remote ablation with recurrence), HTN, HLD (managed by PCP), GERD, DM with neuropathy, OSA (did not tolerate CPAP), BPH, remote DVT after knee surgery, hiatal hernia and lung nodules. He has a prior history of chest pain with cath in 2019 showing minimal coronary atherosclerosis as outlined below (30% RCA), chest pain felt noncardiac. CT at that time did demonstrate 1-70mm nodular opacities (no f/u if low risk, 12 month CT if high-risk, do not see repeat since that time). He has previously had ablation with Dr. [name]. Subsequent to this he had palpitations so had ILR implanted remotely in 2016 confirming recurrence. Some of the episodes were also felt to represent NSR with PACs. Last Linq interrogation was in 12/2018. He has been on anticoagulation. He is seen for pre-op clearance today for prostate surgery (radioactive seed/brachytherapy implant). In preparation for this he had his routine follow-up echo performed 01/13/21 showing EF 60-65%, moderate ABSH, grade 2 DD, normal RV, mild BAE, mild dilation of ascending aorta, severe calcification of the AV, with moderate-severe aortic stenosis.  He is seen back for follow-up overall doing well. He denies any acute change in symptoms over the last few years. He has a history of atypical left sided chest pain (which prompted cath above) about every 2-4 weeks, lasting 5 minutes, without any relationship to exertion, resolves spontaneously. He states Dr. Tamala Julian worked this up in the past and they determined it was likely musculoskeletal. He is able to exercise on the  treadmill 4-5 days a week without any provocation of this pain, no anginal-type discomfort, dyspnea, pre-syncope or syncope. He reports prior history of dizziness occasionally when bending over and standing up that he has not seen any increase in. Overall he feels he is doing well. Tentatively his procedure is schedule for 03/08/21.  Labwork independently reviewed: 01/2020 TChol 223, LDL 112, Hgb 15.4 2019 K 3.9, Cr 0.89  2017 TSH wnl   Past Medical History:  Diagnosis Date  . Aortic stenosis   . BPH (benign prostatic hyperplasia)   . Bronchitis    patient mentions " i am prone to bronchitis"   . Controlled diabetes mellitus type 2 with complications ([place])   . Depression   . Diabetic peripheral neuropathy ([place]) 01/07/2019  . DJD (degenerative joint disease)   . DVT (deep venous thrombosis) ([place])    post op knee surgery in 2001 , behind calf  ; was resolved with blood thinners   . Dysrhythmia    Atrial fibrillation  . Gastric polyp 08/02/2018   see report in chart  . GERD (gastroesophageal reflux disease)   . Glucose intolerance (impaired glucose tolerance)   . Gout    denies  . Hiatal hernia 08/02/2018   see report in chart  . Hyperlipidemia   . Hyperlipidemia associated with type 2 diabetes mellitus ([place])   . Hypertension    denies  . Hypertension associated with diabetes ([place])   . Kidney stones   . Mild CAD   . Mild dilation of ascending aorta ([facility])   . Obesity   . PAF (paroxysmal  atrial fibrillation) ([place])   . 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 Prostate cancer ([place])   . Pulmonary nodule   . Right carpal tunnel syndrome 01/07/2019  . Status post placement of implantable loop recorder 2 years ago ; 2016   left upper chest     Past Surgical History:  Procedure Laterality Date  . ATRIAL FIBRILLATION ABLATION N/A 03/12/2015   Procedure: ATRIAL FIBRILLATION ABLATION;  Surgeon: [name], MD;  Location: [facility] CATH LAB;  Service: Cardiovascular;  Laterality: N/A;   . CARDIAC CATHETERIZATION  1990's   "Dr. Melvern Banker", no blockages per pt  . CARDIOVERSION N/A 02/25/2015   Procedure: CARDIOVERSION;  Surgeon: Sueanne Margarita, MD;  Location: [facility];  Service: Cardiovascular;  Laterality: N/A;  . COLONOSCOPY  2010   [facility]  . CYSTOSCOPY W/ STONE MANIPULATION  1980's   "couldn't get stone so they had to cut me open"  . CYSTOSCOPY WITH RETROGRADE PYELOGRAM, URETEROSCOPY AND STENT PLACEMENT Left 04/24/2015   Procedure: URETHRAL MEATAL DILATION, CYSTOSCOPY WITH LEFT RETROGRADE PYELOGRAM, LEFT URETEROSCOPY, STONE BASKET EXTRACTION,  LEFT DOUBLE J STENT PLACEMENT;  Surgeon: Carolan Clines, MD;  Location: [facility];  Service: Urology;  Laterality: Left;  . EP IMPLANTABLE DEVICE N/A 09/15/2015   Procedure: Loop Recorder Insertion;  Surgeon: [name], MD;  Location: [place] [facility] CV LAB;  Service: Cardiovascular;  Laterality: N/A;  . EXTRACORPOREAL SHOCK WAVE LITHOTRIPSY Bilateral 04/02/2020   Procedure: EXTRACORPOREAL SHOCK WAVE LITHOTRIPSY (ESWL);  Surgeon: Cleon Gustin, MD;  Location: [facility];  Service: Urology;  Laterality: Bilateral;  . EXTRACORPOREAL SHOCK WAVE LITHOTRIPSY Left 04/06/2020   Procedure: EXTRACORPOREAL SHOCK WAVE LITHOTRIPSY (ESWL);  Surgeon: [name], MD;  Location: [facility];  Service: Urology;  Laterality: Left;  . EYE SURGERY Bilateral 2016   cataract surgery with implant ; dr Gerald Stabs grote   . FOREARM FRACTURE SURGERY Right ~ 1961  . FRACTURE SURGERY    . HOLMIUM LASER APPLICATION Left 9/51/8841   Procedure: HOLMIUM LASER APPLICATION;  Surgeon: Carolan Clines, MD;  Location: [facility];  Service: Urology;  Laterality: Left;  . JOINT REPLACEMENT    . KIDNEY STONE SURGERY  1980's   "stone lodged in the duct; had to cut me open to get it"  . KNEE ARTHROSCOPY Bilateral "multiple times"  . LEFT HEART CATH AND CORONARY ANGIOGRAPHY N/A 09/19/2018   Procedure: LEFT HEART CATH AND CORONARY  ANGIOGRAPHY;  Surgeon: Belva Crome, MD;  Location: [facility] CV LAB;  Service: Cardiovascular;  Laterality: N/A;  . SKIN BIOPSY Left 05/06/2020   Shave biopsy left posterior hand neurofiberoma.  . TEE WITHOUT CARDIOVERSION N/A 03/12/2015   Procedure: TRANSESOPHAGEAL ECHOCARDIOGRAM (TEE);  Surgeon: Pixie Casino, MD;  Location: [facility] ENDOSCOPY;  Service: Cardiovascular;  Laterality: N/A;  . TONSILLECTOMY    . TOTAL KNEE ARTHROPLASTY Left 2001   [facility]  . TOTAL KNEE ARTHROPLASTY Right 10/30/2017   Procedure: RIGHT TOTAL KNEE ARTHROPLASTY;  Surgeon: Paralee Cancel, MD;  Location: [facility];  Service: Orthopedics;  Laterality: Right;  90 mins    Current Medications: Current Meds  Medication Sig  . acetaminophen (TYLENOL) 500 MG tablet Take 1,000 mg by mouth every 8 (eight) hours as needed for mild pain.   Marland Kitchen albuterol (VENTOLIN HFA) 108 (90 Base) MCG/ACT inhaler INHALE 2 PUFFS INTO THE LUNGS EVERY 6 HOURS AS NEEDED FOR WHEEZING OR SHORTNESS OF BREATH  . allopurinol (ZYLOPRIM) 300 MG tablet Take 1 tablet (300 mg total) by mouth  daily.  . ammonium lactate (LAC-HYDRIN) 12 % lotion Apply 1 application topically daily as needed for dry skin (3-4 TIMES WEEK).   Marland Kitchen atorvastatin (LIPITOR) 20 MG tablet TAKE ONE TABLET BY MOUTH DAILY  . buPROPion (WELLBUTRIN XL) 300 MG 24 hr tablet TAKE ONE TABLET BY MOUTH DAILY  . diltiazem (TIAZAC) 180 MG 24 hr capsule Take 1 capsule (180 mg total) by mouth daily.  Marland Kitchen doxycycline (VIBRAMYCIN) 100 MG capsule Take 1 pill by mouth twice a day with a meal  . ELIQUIS 5 MG TABS tablet TAKE ONE TABLET BY MOUTH TWICE A DAY  . finasteride (PROSCAR) 5 MG tablet Take 5 mg by mouth daily.  . fluticasone (FLONASE) 50 MCG/ACT nasal spray 2 SPRAYS NASALLY DAILY  . Fluticasone-Salmeterol (ADVAIR DISKUS) 250-50 MCG/DOSE AEPB Inhale 1 puff into the lungs 2 (two) times daily.  Marland Kitchen loratadine (CLARITIN) 10 MG tablet Take 10 mg by mouth daily.  Marland Kitchen LORazepam (ATIVAN) 0.5 MG tablet TAKE 1 TABLET  TWICE DAILY AS NEEDED FOR ANXIETY.  . metFORMIN (GLUCOPHAGE) 500 MG tablet Take 1 tablet (500 mg total) by mouth 2 (two) times daily with a meal.  . metoprolol succinate (TOPROL-XL) 25 MG 24 hr tablet TAKE ONE TABLET BY MOUTH EVERY EVENING  . Multiple Vitamins-Minerals (MULTIVITAMIN WITH MINERALS) tablet Take 1 tablet by mouth daily.  . mupirocin ointment (BACTROBAN) 2 % Apply to buttock after each dressing  . olopatadine (PATANOL) 0.1 % ophthalmic solution Place 1 drop into both eyes daily as needed for allergies (tearing).   . Omega-3 Fatty Acids (FISH OIL TRIPLE STRENGTH) 1400 MG CAPS Take 1,400 mg by mouth daily.   Marland Kitchen omeprazole (PRILOSEC) 20 MG capsule Take 20 mg by mouth daily.  . sertraline (ZOLOFT) 50 MG tablet TAKE ONE TABLET BY MOUTH DAILY  . sildenafil (VIAGRA) 100 MG tablet Take 100 mg by mouth daily as needed for erectile dysfunction.  . tamsulosin (FLOMAX) 0.4 MG CAPS capsule Take 0.4 mg by mouth daily.      Allergies:   Patient has no active allergies.   Social History   Socioeconomic History  . Marital status: Married    Spouse name: Not on file  . Number of children: 1  . Years of education: Not on file  . Highest education level: Some college, no degree  Occupational History  . Occupation: Retired  Tobacco Use  . Smoking status: Never Smoker  . Smokeless tobacco: Never Used  Vaping Use  . Vaping Use: Never used  Substance and Sexual Activity  . Alcohol use: Yes    Comment: seldom   . Drug use: No  . Sexual activity: Yes  Other Topics Concern  . Not on file  Social History Narrative   Pt lives in [place] with spouse.  47 yo son is health.  Second son died in car accident.      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 There is no history of Breast cancer, Colon  cancer, Pancreatic cancer, or Prostate cancer.  ROS:   Please see the history of present illness.  All other systems are reviewed and otherwise negative.    EKGs/Labs/Other Studies Reviewed:    Studies reviewed are outlined and summarized above. Reports included below if pertinent.  2D Echo 01/13/21  1. Left ventricular ejection fraction, by estimation, is 60 to 65%. The  left ventricle has normal function. The left ventricle has no regional  wall motion abnormalities. There is moderate asymmetric left ventricular  hypertrophy of the basal-septal  segment. Left ventricular diastolic parameters are consistent with Grade  II diastolic dysfunction (pseudonormalization). Elevated left atrial  pressure.  2. Right ventricular systolic function is normal. The right ventricular  size is normal. Tricuspid regurgitation signal is inadequate for assessing  PA pressure.  3. Left atrial size was mildly dilated.  4. Right atrial size was mildly dilated.  5. The mitral valve is normal in structure. Trivial mitral valve  regurgitation.  6. Aortic dilatation noted. There is mild dilatation of the ascending  aorta, measuring 38 mm.  7. The inferior vena cava is normal in size with <50% respiratory  variability, suggesting right atrial pressure of 8 mmHg.  8. The aortic valve is calcified. There is severe calcifcation of the  aortic valve. Aortic valve regurgitation is trivial. Moderate to severe  aortic valve stenosis. Vmax 3.9 m/s, MG 37 mmHg, AVA 1.1 cm^2, DI 0.22   Cath 2019  Right dominant coronary anatomy.  Normal left main.  Normal LAD.  Normal circumflex giving origin to 4 obtuse marginals  with the second marginal being a large vessel supplying much of the lateral wall.  RCA contains eccentric proximal 30% narrowing.  Otherwise widely patent without any significant obstructive disease.  Heavily calcified aortic valve with restricted motion.  Transvalvular peak to peak gradient 33 mmHg; mean gradient 30 mmHg.  Normal left ventricular LVEDP, 15 mmHg.  Atypical episodes of chest pain occurring at rest lasting up to 30 to 40 minutes, characterized as severe and in the left pectoral region.  Suspect neurogenic/musculoskeletal.  Rule out aortic related.  RECOMMENDATIONS:   CT aortic angiography to rule out aneurysm/dissection.  We will set up to be done as an outpatient.  Continue aerobic activity.  Resume apixaban a.m. 09/20/2018.  No indication for antiplatelet therapy at this time.     EKG:  EKG is ordered today, personally reviewed, demonstrating NSR 60bpm, baseline wander in lead V1-V2, low P wave amplitude, no acute STT changes  Recent Labs: No results found for requested labs within last 8760 hours.  Recent Lipid Panel    Component Value Date/Time   CHOL 195 01/28/2020 1023   TRIG 223 (H) 01/28/2020 1023   HDL 44 01/28/2020 1023   CHOLHDL 4.4 01/28/2020 1023   CHOLHDL 3.6 08/16/2016 0925   VLDL 28 08/16/2016 0925   LDLCALC 112 (H) 01/28/2020 1023    PHYSICAL EXAM:    VS:  BP 122/80   Pulse 60   Ht 6' (1.829 m)   Wt 244 lb 9.6 oz (110.9 kg)   BMI 33.17 kg/m   BMI: Body mass index is 33.17 kg/m.  GEN: Well nourished, well developed male in no acute distress HEENT: normocephalic, atraumatic Neck: no JVD, carotid bruits, or masses Cardiac: RRR; 2/6 SEM, soft S2, no rubs or gallops, no edema  Respiratory:  clear to auscultation bilaterally, normal work of breathing GI: soft, nontender, nondistended, + BS MS: no deformity or atrophy Skin: warm and dry, no  rash Neuro:  Alert and Oriented x 3, Strength and sensation are intact, follows  commands Psych: euthymic mood, full affect  Wt Readings from Last 3 Encounters:  02/15/21 244 lb 9.6 oz (110.9 kg)  12/15/20 239 lb (108.4 kg)  10/02/20 245 lb 9.6 oz (111.4 kg)     ASSESSMENT & PLAN:   1. Pre-op cardiovascular examination, most notably with aortic stenosis - RCRI 0.4% indicating low risk of CV complications but this does not necessarily factor in his aortic stenosis. However, he is able to perform over 4 METS without precipitating any angina, dyspnea, pre-syncope or syncope. He has not had any recent decline in his exercise capacity. I discussed his clinical case and echo with Dr. Tamala Julian. Given clinical stability, Jeremy Tucker would be at acceptable risk for the planned procedure without further cardiovascular testing. Per pharmD review, per office protocol, patient can hold Eliquiss for 2 days prior to procedure. Will route this note to requesting surgeon after signing. He was encouraged to reach out if he develops any new symptoms at any time. Per d/w MD, we will arrange a 6 month follow-up with Dr. Tamala Julian at which time it will be decided if he should repeat echo then or wait until the 1 year mark.  2. Mild CAD - no recent anginal symptoms. Minimal by cath in 2019. Is not on ASA due to concomitant Eliquis. Continue BB, statin as tolerated. Lipids historically followed by PCP but has not had any recent labs. Will get CMET, CBC, lipid profile today.  3. Paroxysmal atrial fib - maintaining NSR. He still has his loop recorder in place. Last interrogation was in 2020. It appears he was previously supposed to f/u afib clinic but did not do so. Given that he has not seen Dr. [name] in recent years, will arrange follow-up as well as to discuss what to do with the ILR from here (leave in vs explant). Update CBC and creatinine today.  4. Dilated ascending aorta - common finding but anticipate following with subsequent echocardiograms and/or CT in follow-up at discretion of primary  cardiologist. BP controlled.  5. Pulmonary nodules - noted on 2019 CT. Have recommended he f/u with primary care to discuss whether further imaging is needed.  Disposition: F/u with Dr. Tamala Julian in 6 months. As above will also request f/u with Dr. [name].   Medication Adjustments/Labs and Tests Ordered: Current medicines are reviewed at length with the patient today.  Concerns regarding medicines are outlined above. Medication changes, Labs and Tests ordered today are summarized above and listed in the Patient Instructions accessible in Encounters.   Signed, Charlie Pitter, PA-C  02/15/2021 9:32 AM    [address], [place], [place]  [address] Phone: [phone]; Fax: [phone]

## 2021-02-15 ENCOUNTER — Ambulatory Visit (INDEPENDENT_AMBULATORY_CARE_PROVIDER_SITE_OTHER): Payer: PPO | Admitting: Physician Assistant

## 2021-02-15 ENCOUNTER — Encounter: Payer: Self-pay | Admitting: Physician Assistant

## 2021-02-15 ENCOUNTER — Other Ambulatory Visit: Payer: Self-pay

## 2021-02-15 VITALS — BP 122/80 | HR 60 | Ht 72.0 in | Wt 244.6 lb

## 2021-02-15 DIAGNOSIS — I2510 Atherosclerotic heart disease of native coronary artery without angina pectoris: Secondary | ICD-10-CM | POA: Diagnosis not present

## 2021-02-15 DIAGNOSIS — R911 Solitary pulmonary nodule: Secondary | ICD-10-CM

## 2021-02-15 DIAGNOSIS — I77810 Thoracic aortic ectasia: Secondary | ICD-10-CM

## 2021-02-15 DIAGNOSIS — I350 Nonrheumatic aortic (valve) stenosis: Secondary | ICD-10-CM

## 2021-02-15 DIAGNOSIS — Z01810 Encounter for preprocedural cardiovascular examination: Secondary | ICD-10-CM

## 2021-02-15 DIAGNOSIS — I480 Paroxysmal atrial fibrillation: Secondary | ICD-10-CM

## 2021-02-15 LAB — COMPREHENSIVE METABOLIC PANEL
ALT: 35 IU/L (ref 0–44)
AST: 26 IU/L (ref 0–40)
Albumin/Globulin Ratio: 1.8 (ref 1.2–2.2)
Albumin: 4.4 g/dL (ref 3.8–4.8)
Alkaline Phosphatase: 82 IU/L (ref 44–121)
BUN/Creatinine Ratio: 20 (ref 10–24)
BUN: 15 mg/dL (ref 8–27)
Bilirubin Total: 0.3 mg/dL (ref 0.0–1.2)
CO2: 24 mmol/L (ref 20–29)
Calcium: 9.1 mg/dL (ref 8.6–10.2)
Chloride: 101 mmol/L (ref 96–106)
Creatinine, Ser: 0.74 mg/dL — ABNORMAL LOW (ref 0.76–1.27)
Globulin, Total: 2.5 g/dL (ref 1.5–4.5)
Glucose: 181 mg/dL — ABNORMAL HIGH (ref 65–99)
Potassium: 4.4 mmol/L (ref 3.5–5.2)
Sodium: 141 mmol/L (ref 134–144)
Total Protein: 6.9 g/dL (ref 6.0–8.5)
eGFR: 97 mL/min/{1.73_m2} (ref >59)

## 2021-02-15 LAB — LIPID PANEL
Chol/HDL Ratio: 3.3 ratio (ref 0.0–5.0)
Cholesterol, Total: 172 mg/dL (ref 100–199)
HDL: 52 mg/dL (ref >39)
LDL Chol Calc (NIH): 93 mg/dL (ref 0–99)
Triglycerides: 154 mg/dL — ABNORMAL HIGH (ref 0–149)
VLDL Cholesterol Cal: 27 mg/dL (ref 5–40)

## 2021-02-15 LAB — CBC
Hematocrit: 42.1 % (ref 37.5–51.0)
Hemoglobin: 14.6 g/dL (ref 13.0–17.7)
MCH: 32.4 pg (ref 26.6–33.0)
MCHC: 34.7 g/dL (ref 31.5–35.7)
MCV: 94 fL (ref 79–97)
Platelets: 193 10*3/uL (ref 150–450)
RBC: 4.5 x10E6/uL (ref 4.14–5.80)
RDW: 12 % (ref 11.6–15.4)
WBC: 8.2 10*3/uL (ref 3.4–10.8)

## 2021-02-15 NOTE — Patient Instructions (Addendum)
Medication Instructions:  Your physician recommends that you continue on your current medications as directed. Please refer to the Current Medication list given to you today.  *If you need a refill on your cardiac medications before your next appointment, please call your pharmacy*   Lab Work: TODAY:  CMET, CBC, & LIPID   If you have labs (blood work) drawn today and your tests are completely normal, you will receive your results only by: Marland Kitchen MyChart Message (if you have MyChart) OR . A paper copy in the mail If you have any lab test that is abnormal or we need to change your treatment, we will call you to review the results.   Testing/Procedures: None ordered   Follow-Up: At [facility], you and your health needs are our priority.  As part of our continuing mission to provide you with exceptional heart care, we have created designated Provider Care Teams.  These Care Teams include your primary Cardiologist (physician) and Advanced Practice Providers (APPs -  Physician Assistants and Nurse Practitioners) who all work together to provide you with the care you need, when you need it.  We recommend signing up for the patient portal called "MyChart".  Sign up information is provided on this After Visit Summary.  MyChart is used to connect with patients for Virtual Visits (Telemedicine).  Patients are able to view lab/test results, encounter notes, upcoming appointments, etc.  Non-urgent messages can be sent to your provider as well.   To learn more about what you can do with MyChart, go to [email or web address].    Your next appointment:   6 month(s)  The format for your next appointment:   In Person  Provider:   You may see [name], MD or one of the following Advanced Practice Providers on your designated Care Team:    Kathyrn Drown, NP   Your physician recommends that you schedule a follow-up appointment in: 1ST available with Dr. [name] for a-fib.   Other  Instructions Your clearance has been granted to hold the Eliquis the 2 days prior to your surgery  Follow up with your primary care dr for the pulmonary nodules

## 2021-02-16 ENCOUNTER — Other Ambulatory Visit: Payer: Self-pay | Admitting: *Deleted

## 2021-02-16 ENCOUNTER — Telehealth: Payer: Self-pay | Admitting: *Deleted

## 2021-02-16 DIAGNOSIS — Z79899 Other long term (current) drug therapy: Secondary | ICD-10-CM

## 2021-02-16 MED ORDER — ATORVASTATIN CALCIUM 40 MG PO TABS: 40.0000 mg | ORAL_TABLET | Freq: Every day | ORAL | 3 refills | Status: DC

## 2021-02-16 NOTE — Telephone Encounter (Signed)
error 

## 2021-02-16 NOTE — Telephone Encounter (Signed)
-----   Message from Charlie Pitter, [place] sent at 02/15/2021  5:08 PM EDT ----- Pls let pt know labs overall look good. Blood sugar is elevated, has diabetes. Blood count, kidney, liver normal. Cholesterol could use some work. Given diabetes and his coronary calcification LDL goal is <70 and his is 93 with elevated triglycerides as well. Continue to work on [employer]. Would recommend increasing atorvastatin to 40mg  daily with f/u liver/lipids in about 6 weeks. Of note I see he has an echo scheduled in Sept already (generated from March echo) When I had talked to Dr. Tamala Julian today he said to have the patient follow up in clinic in 6 months and he would decide whether to do an echo at that time or wait til the 12 month mark. Thx.

## 2021-02-19 ENCOUNTER — Encounter: Payer: Self-pay | Admitting: *Deleted

## 2021-02-22 ENCOUNTER — Ambulatory Visit: Payer: PPO | Admitting: Internal Medicine

## 2021-02-22 ENCOUNTER — Other Ambulatory Visit: Payer: Self-pay

## 2021-02-22 ENCOUNTER — Encounter: Payer: Self-pay | Admitting: Internal Medicine

## 2021-02-22 VITALS — BP 120/72 | HR 60 | Ht 72.0 in | Wt 243.4 lb

## 2021-02-22 DIAGNOSIS — R350 Frequency of micturition: Secondary | ICD-10-CM | POA: Diagnosis not present

## 2021-02-22 DIAGNOSIS — C61 Malignant neoplasm of prostate: Secondary | ICD-10-CM | POA: Diagnosis not present

## 2021-02-22 DIAGNOSIS — I10 Essential (primary) hypertension: Secondary | ICD-10-CM

## 2021-02-22 DIAGNOSIS — I480 Paroxysmal atrial fibrillation: Secondary | ICD-10-CM | POA: Diagnosis not present

## 2021-02-22 DIAGNOSIS — D6869 Other thrombophilia: Secondary | ICD-10-CM | POA: Diagnosis not present

## 2021-02-22 DIAGNOSIS — N401 Enlarged prostate with lower urinary tract symptoms: Secondary | ICD-10-CM | POA: Diagnosis not present

## 2021-02-22 DIAGNOSIS — N200 Calculus of kidney: Secondary | ICD-10-CM | POA: Diagnosis not present

## 2021-02-22 HISTORY — PX: OTHER SURGICAL HISTORY: SHX169

## 2021-02-22 NOTE — Progress Notes (Deleted)
PCP: Denita Lung, MD Primary Cardiologist: *** Primary EP: Dr [name]  Jeremy Tucker is a 70 y.o. male who presents today for routine electrophysiology followup.  Since last being seen in our clinic, the patient reports doing very well.  Today, he denies symptoms of palpitations, chest pain, shortness of breath,  lower extremity edema, dizziness, presyncope, or syncope.  The patient is otherwise without complaint today.   Past Medical History:  Diagnosis Date  . Aortic stenosis   . BPH (benign prostatic hyperplasia)   . Bronchitis    patient mentions " i am prone to bronchitis"   . Controlled diabetes mellitus type 2 with complications ([place])   . Depression   . Diabetic peripheral neuropathy ([place]) 01/07/2019  . DJD (degenerative joint disease)   . DVT (deep venous thrombosis) ([place])    post op knee surgery in 2001 , behind calf  ; was resolved with blood thinners   . Dysrhythmia    Atrial fibrillation  . Gastric polyp 08/02/2018   see report in chart  . GERD (gastroesophageal reflux disease)   . Glucose intolerance (impaired glucose tolerance)   . Gout    denies  . Hiatal hernia 08/02/2018   see report in chart  . Hyperlipidemia   . Hyperlipidemia associated with type 2 diabetes mellitus ([place])   . Hypertension    denies  . Hypertension associated with diabetes ([place])   . Kidney stones   . Mild CAD   . Mild dilation of ascending aorta (HCC)   . Obesity   . PAF (paroxysmal atrial fibrillation) ([place])   . 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 Prostate cancer ([place])   . Pulmonary nodule   . Right carpal tunnel syndrome 01/07/2019  . Status post placement of implantable loop recorder 2 years ago ; 2016   left upper chest    Past Surgical History:  Procedure Laterality Date  . ATRIAL FIBRILLATION ABLATION N/A 03/12/2015   Procedure: ATRIAL FIBRILLATION ABLATION;  Surgeon: [name], MD;  Location: [facility] CATH LAB;  Service: Cardiovascular;  Laterality: N/A;  .  CARDIAC CATHETERIZATION  1990's   "Dr. Melvern Banker", no blockages per pt  . CARDIOVERSION N/A 02/25/2015   Procedure: CARDIOVERSION;  Surgeon: Sueanne Margarita, MD;  Location: [place];  Service: Cardiovascular;  Laterality: N/A;  . COLONOSCOPY  2010   MEDOFF  . CYSTOSCOPY W/ STONE MANIPULATION  1980's   "couldn't get stone so they had to cut me open"  . CYSTOSCOPY WITH RETROGRADE PYELOGRAM, URETEROSCOPY AND STENT PLACEMENT Left 04/24/2015   Procedure: URETHRAL MEATAL DILATION, CYSTOSCOPY WITH LEFT RETROGRADE PYELOGRAM, LEFT URETEROSCOPY, STONE BASKET EXTRACTION,  LEFT DOUBLE J STENT PLACEMENT;  Surgeon: Carolan Clines, MD;  [facility];  Service: Urology;  Laterality: Left;  . EP IMPLANTABLE DEVICE N/A 09/15/2015   Procedure: Loop Recorder Insertion;  Surgeon: [name], MD;  Location: [place] CV LAB;  Service: Cardiovascular;  Laterality: N/A;  . EXTRACORPOREAL SHOCK WAVE LITHOTRIPSY Bilateral 04/02/2020   Procedure: EXTRACORPOREAL SHOCK WAVE LITHOTRIPSY (ESWL);  Surgeon: Cleon Gustin, MD;  [facility];  Service: Urology;  Laterality: Bilateral;  . EXTRACORPOREAL SHOCK WAVE LITHOTRIPSY Left 04/06/2020   Procedure: EXTRACORPOREAL SHOCK WAVE LITHOTRIPSY (ESWL);  Surgeon: [name], MD;  Location: [facility];  Service: Urology;  Laterality: Left;  . EYE SURGERY Bilateral 2016   cataract surgery with implant ; dr Gerald Stabs grote   . FOREARM FRACTURE SURGERY Right ~ 1961  .  FRACTURE SURGERY    . HOLMIUM LASER APPLICATION Left 4/62/7035   Procedure: HOLMIUM LASER APPLICATION;  Surgeon: Carolan Clines, MD;  [facility];  Service: Urology;  Laterality: Left;  . JOINT REPLACEMENT    . KIDNEY STONE SURGERY  1980's   "stone lodged in the duct; had to cut me open to get it"  . KNEE ARTHROSCOPY Bilateral "multiple times"  . LEFT HEART CATH AND CORONARY ANGIOGRAPHY N/A 09/19/2018   Procedure: LEFT HEART CATH AND CORONARY  ANGIOGRAPHY;  Surgeon: Belva Crome, MD;  Location: [place] CV LAB;  Service: Cardiovascular;  Laterality: N/A;  . SKIN BIOPSY Left 05/06/2020   Shave biopsy left posterior hand neurofiberoma.  . TEE WITHOUT CARDIOVERSION N/A 03/12/2015   Procedure: TRANSESOPHAGEAL ECHOCARDIOGRAM (TEE);  Surgeon: Pixie Casino, MD;  Location: [facility] ENDOSCOPY;  Service: Cardiovascular;  Laterality: N/A;  . TONSILLECTOMY    . TOTAL KNEE ARTHROPLASTY Left 2001   [facility]URPHY  . TOTAL KNEE ARTHROPLASTY Right 10/30/2017   Procedure: RIGHT TOTAL KNEE ARTHROPLASTY;  Surgeon: Paralee Cancel, MD;  [facility];  Service: Orthopedics;  Laterality: Right;  90 mins    ROS- all systems are reviewed and negatives except as per HPI above  Current Outpatient Medications  Medication Sig Dispense Refill  . acetaminophen (TYLENOL) 500 MG tablet Take 1,000 mg by mouth every 8 (eight) hours as needed for mild pain.     Marland Kitchen albuterol (VENTOLIN HFA) 108 (90 Base) MCG/ACT inhaler INHALE 2 PUFFS INTO THE LUNGS EVERY 6 HOURS AS NEEDED FOR WHEEZING OR SHORTNESS OF BREATH 54 g 0  . allopurinol (ZYLOPRIM) 300 MG tablet Take 1 tablet (300 mg total) by mouth daily. 90 tablet 3  . ammonium lactate (LAC-HYDRIN) 12 % lotion Apply 1 application topically daily as needed for dry skin (3-4 TIMES WEEK).     Marland Kitchen atorvastatin (LIPITOR) 40 MG tablet Take 1 tablet (40 mg total) by mouth daily. 90 tablet 3  . buPROPion (WELLBUTRIN XL) 300 MG 24 hr tablet TAKE ONE TABLET BY MOUTH DAILY 90 tablet 0  . diltiazem (TIAZAC) 180 MG 24 hr capsule Take 1 capsule (180 mg total) by mouth daily. 60 capsule 0  . doxycycline (VIBRAMYCIN) 100 MG capsule Take 1 pill by mouth twice a day with a meal    . ELIQUIS 5 MG TABS tablet TAKE ONE TABLET BY MOUTH TWICE A DAY 180 tablet 1  . finasteride (PROSCAR) 5 MG tablet Take 5 mg by mouth daily.    . fluticasone (FLONASE) 50 MCG/ACT nasal spray 2 SPRAYS NASALLY DAILY    . Fluticasone-Salmeterol (ADVAIR DISKUS) 250-50  MCG/DOSE AEPB Inhale 1 puff into the lungs 2 (two) times daily. 1 each 3  . loratadine (CLARITIN) 10 MG tablet Take 10 mg by mouth daily.    Marland Kitchen LORazepam (ATIVAN) 0.5 MG tablet TAKE 1 TABLET TWICE DAILY AS NEEDED FOR ANXIETY. 20 tablet 0  . metFORMIN (GLUCOPHAGE) 500 MG tablet Take 1 tablet (500 mg total) by mouth 2 (two) times daily with a meal. 180 tablet 3  . metoprolol succinate (TOPROL-XL) 25 MG 24 hr tablet TAKE ONE TABLET BY MOUTH EVERY EVENING 90 tablet 0  . Multiple Vitamins-Minerals (MULTIVITAMIN WITH MINERALS) tablet Take 1 tablet by mouth daily.    . mupirocin ointment (BACTROBAN) 2 % Apply to buttock after each dressing    . olopatadine (PATANOL) 0.1 % ophthalmic solution Place 1 drop into both eyes daily as needed for allergies (tearing).     . Omega-3  Fatty Acids (FISH OIL TRIPLE STRENGTH) 1400 MG CAPS Take 1,400 mg by mouth daily.     Marland Kitchen omeprazole (PRILOSEC) 20 MG capsule Take 20 mg by mouth daily.    . sertraline (ZOLOFT) 50 MG tablet TAKE ONE TABLET BY MOUTH DAILY 90 tablet 0  . sildenafil (VIAGRA) 100 MG tablet Take 100 mg by mouth daily as needed for erectile dysfunction.    . tamsulosin (FLOMAX) 0.4 MG CAPS capsule Take 0.4 mg by mouth daily.      No current facility-administered medications for this visit.    Physical Exam: Vitals:   02/22/21 1054  BP: 120/72  Pulse: 60  SpO2: 95%  Weight: 243 lb 6.4 oz (110.4 kg)  Height: 6' (1.829 m)    GEN- The patient is well appearing, alert and oriented x 3 today.   Head- normocephalic, atraumatic Eyes-  Sclera clear, conjunctiva pink Ears- hearing intact Oropharynx- clear Lungs- Clear to ausculation bilaterally, normal work of breathing Heart- Regular rate and rhythm, no murmurs, rubs or gallops, PMI not laterally displaced GI- soft, NT, ND, + BS Extremities- no clubbing, cyanosis, or edema  Wt Readings from Last 3 Encounters:  02/22/21 243 lb 6.4 oz (110.4 kg)  02/15/21 244 lb 9.6 oz (110.9 kg)  12/15/20 239 lb  (108.4 kg)    EKG tracing ordered today is personally reviewed and shows ***  Assessment and Plan:  ***  Risks, benefits and potential toxicities for medications prescribed and/or refilled reviewed with patient today.   [name] MD, [facility] 02/22/2021 11:14 AM

## 2021-02-22 NOTE — Progress Notes (Signed)
PCP: Denita Lung, MD Primary Cardiologist: Dr Tamala Julian Primary EP: Dr [name]  Jeremy Tucker is a 70 y.o. male who presents today for routine electrophysiology followup.  Since last being seen in our clinic, the patient reports doing very well. He reports that he gets a "twing" occasionally. He has some dizziness and shortness of breath when bending down. However denies any syncopal episodes. He is taking his Eliquis constantly. Today, he denies symptoms of palpitations, chest pain, shortness of breath,  lower extremity edema.  The patient is otherwise without complaint today. Today, he denies symptoms of palpitations, chest pain, shortness of breath, or lower extremity edema.  The patient is otherwise without complaint today. He reports that he was diagnosed with prostate cancer, he is being managed by oncology.   Past Medical History:  Diagnosis Date  . Aortic stenosis   . BPH (benign prostatic hyperplasia)   . Bronchitis    patient mentions " i am prone to bronchitis"   . Controlled diabetes mellitus type 2 with complications ([place])   . Depression   . Diabetic peripheral neuropathy ([place]) 01/07/2019  . DJD (degenerative joint disease)   . DVT (deep venous thrombosis) ([place])    post op knee surgery in 2001 , behind calf  ; was resolved with blood thinners   . Dysrhythmia    Atrial fibrillation  . Gastric polyp 08/02/2018   see report in chart  . GERD (gastroesophageal reflux disease)   . Glucose intolerance (impaired glucose tolerance)   . Gout    denies  . Hiatal hernia 08/02/2018   see report in chart  . Hyperlipidemia   . Hyperlipidemia associated with type 2 diabetes mellitus ([place])   . Hypertension    denies  . Hypertension associated with diabetes ([place])   . Kidney stones   . Mild CAD   . Mild dilation of ascending aorta (H[facility])   . Obesity   . PAF (paroxysmal atrial fibrillation) ([place])   . 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 Prostate cancer ([place])   . Pulmonary  nodule   . Right carpal tunnel syndrome 01/07/2019  . Status post placement of implantable loop recorder 2 years ago ; 2016   left upper chest    Past Surgical History:  Procedure Laterality Date  . ATRIAL FIBRILLATION ABLATION N/A 03/12/2015   Procedure: ATRIAL FIBRILLATION ABLATION;  Surgeon: [name], MD;  Location: [facility] CATH LAB;  Service: Cardiovascular;  Laterality: N/A;  . CARDIAC CATHETERIZATION  1990's   "Dr. Melvern Banker", no blockages per pt  . CARDIOVERSION N/A 02/25/2015   Procedure: CARDIOVERSION;  Surgeon: Sueanne Margarita, MD;  Location: [place];  Service: Cardiovascular;  Laterality: N/A;  . COLONOSCOPY  2010   MEDOFF  . CYSTOSCOPY W/ STONE MANIPULATION  1980's   "couldn't get stone so they had to cut me open"  . CYSTOSCOPY WITH RETROGRADE PYELOGRAM, URETEROSCOPY AND STENT PLACEMENT Left 04/24/2015   Procedure: URETHRAL MEATAL DILATION, CYSTOSCOPY WITH LEFT RETROGRADE PYELOGRAM, LEFT URETEROSCOPY, STONE BASKET EXTRACTION,  LEFT DOUBLE J STENT PLACEMENT;  Surgeon: Carolan Clines, MD;  [facility];  Service: Urology;  Laterality: Left;  . EP IMPLANTABLE DEVICE N/A 09/15/2015   Procedure: Loop Recorder Insertion;  Surgeon: [name], MD;  Location: [place] CV LAB;  Service: Cardiovascular;  Laterality: N/A;  . EXTRACORPOREAL SHOCK WAVE LITHOTRIPSY Bilateral 04/02/2020   Procedure: EXTRACORPOREAL SHOCK WAVE LITHOTRIPSY (ESWL);  Surgeon: Cleon Gustin, MD;  Location: [facility];  Service:  Urology;  Laterality: Bilateral;  . EXTRACORPOREAL SHOCK WAVE LITHOTRIPSY Left 04/06/2020   Procedure: EXTRACORPOREAL SHOCK WAVE LITHOTRIPSY (ESWL);  Surgeon: [name], MD;  Location[facility];  Service: Urology;  Laterality: Left;  . EYE SURGERY Bilateral 2016   cataract surgery with implant ; dr Gerald Stabs grote   . FOREARM FRACTURE SURGERY Right ~ 1961  . FRACTURE SURGERY    . HOLMIUM LASER APPLICATION Left 5/70/1779   Procedure:  HOLMIUM LASER APPLICATION;  Surgeon: Carolan Clines, MD;  [facility];  Service: Urology;  Laterality: Left;  . JOINT REPLACEMENT    . KIDNEY STONE SURGERY  1980's   "stone lodged in the duct; had to cut me open to get it"  . KNEE ARTHROSCOPY Bilateral "multiple times"  . LEFT HEART CATH AND CORONARY ANGIOGRAPHY N/A 09/19/2018   Procedure: LEFT HEART CATH AND CORONARY ANGIOGRAPHY;  Surgeon: Belva Crome, MD;  Location: [place] CV LAB;  Service: Cardiovascular;  Laterality: N/A;  . SKIN BIOPSY Left 05/06/2020   Shave biopsy left posterior hand neurofiberoma.  . TEE WITHOUT CARDIOVERSION N/A 03/12/2015   Procedure: TRANSESOPHAGEAL ECHOCARDIOGRAM (TEE);  Surgeon: Pixie Casino, MD;  Location: [facility] ENDOSCOPY;  Service: Cardiovascular;  Laterality: N/A;  . TONSILLECTOMY    . TOTAL KNEE ARTHROPLASTY Left 2001   [facility]URPHY  . TOTAL KNEE ARTHROPLASTY Right 10/30/2017   Procedure: RIGHT TOTAL KNEE ARTHROPLASTY;  Surgeon: Paralee Cancel, MD;  [facility];  Service: Orthopedics;  Laterality: Right;  90 mins    ROS- all systems are reviewed and negatives except as per HPI above  Current Outpatient Medications  Medication Sig Dispense Refill  . acetaminophen (TYLENOL) 500 MG tablet Take 1,000 mg by mouth every 8 (eight) hours as needed for mild pain.     Marland Kitchen albuterol (VENTOLIN HFA) 108 (90 Base) MCG/ACT inhaler INHALE 2 PUFFS INTO THE LUNGS EVERY 6 HOURS AS NEEDED FOR WHEEZING OR SHORTNESS OF BREATH 54 g 0  . allopurinol (ZYLOPRIM) 300 MG tablet Take 1 tablet (300 mg total) by mouth daily. 90 tablet 3  . ammonium lactate (LAC-HYDRIN) 12 % lotion Apply 1 application topically daily as needed for dry skin (3-4 TIMES WEEK).     Marland Kitchen atorvastatin (LIPITOR) 40 MG tablet Take 1 tablet (40 mg total) by mouth daily. 90 tablet 3  . buPROPion (WELLBUTRIN XL) 300 MG 24 hr tablet TAKE ONE TABLET BY MOUTH DAILY 90 tablet 0  . diltiazem (TIAZAC) 180 MG 24 hr capsule Take 1 capsule (180 mg total) by mouth  daily. 60 capsule 0  . doxycycline (VIBRAMYCIN) 100 MG capsule Take 1 pill by mouth twice a day with a meal    . ELIQUIS 5 MG TABS tablet TAKE ONE TABLET BY MOUTH TWICE A DAY 180 tablet 1  . finasteride (PROSCAR) 5 MG tablet Take 5 mg by mouth daily.    . fluticasone (FLONASE) 50 MCG/ACT nasal spray 2 SPRAYS NASALLY DAILY    . Fluticasone-Salmeterol (ADVAIR DISKUS) 250-50 MCG/DOSE AEPB Inhale 1 puff into the lungs 2 (two) times daily. 1 each 3  . loratadine (CLARITIN) 10 MG tablet Take 10 mg by mouth daily.    Marland Kitchen LORazepam (ATIVAN) 0.5 MG tablet TAKE 1 TABLET TWICE DAILY AS NEEDED FOR ANXIETY. 20 tablet 0  . metFORMIN (GLUCOPHAGE) 500 MG tablet Take 1 tablet (500 mg total) by mouth 2 (two) times daily with a meal. 180 tablet 3  . metoprolol succinate (TOPROL-XL) 25 MG 24 hr tablet TAKE ONE  TABLET BY MOUTH EVERY EVENING 90 tablet 0  . Multiple Vitamins-Minerals (MULTIVITAMIN WITH MINERALS) tablet Take 1 tablet by mouth daily.    . mupirocin ointment (BACTROBAN) 2 % Apply to buttock after each dressing    . olopatadine (PATANOL) 0.1 % ophthalmic solution Place 1 drop into both eyes daily as needed for allergies (tearing).     . Omega-3 Fatty Acids (FISH OIL TRIPLE STRENGTH) 1400 MG CAPS Take 1,400 mg by mouth daily.     Marland Kitchen omeprazole (PRILOSEC) 20 MG capsule Take 20 mg by mouth daily.    . sertraline (ZOLOFT) 50 MG tablet TAKE ONE TABLET BY MOUTH DAILY 90 tablet 0  . sildenafil (VIAGRA) 100 MG tablet Take 100 mg by mouth daily as needed for erectile dysfunction.    . tamsulosin (FLOMAX) 0.4 MG CAPS capsule Take 0.4 mg by mouth daily.      No current facility-administered medications for this visit.    Physical Exam: Vitals:   02/22/21 1054  BP: 120/72  Pulse: 60  SpO2: 95%  Weight: 243 lb 6.4 oz (110.4 kg)  Height: 6' (1.829 m)    GEN- The patient is well appearing, alert and oriented x 3 today.   Head- normocephalic, atraumatic Eyes-  Sclera clear, conjunctiva pink Ears- hearing  intact Oropharynx- clear Lungs- rhonchi but clear with cough, clear to ausculation bilaterally, normal work of breathing Heart- Regular rate and rhythm,  2/6 systolic injection murmur, rubs or gallops, PMI not laterally displaced GI- soft, NT, ND, + BS Extremities- no clubbing, cyanosis, or edema  Wt Readings from Last 3 Encounters:  02/22/21 243 lb 6.4 oz (110.4 kg)  02/15/21 244 lb 9.6 oz (110.9 kg)  12/15/20 239 lb (108.4 kg)    Assessment and Plan:  1. Paroxsymal atrial fibrillation Doing very well with minimal symptoms Continue long term anticoagulation His ILR is at RRT He wishes to have this removed.  Risks and benefits to ILR removal were discussed with the patient today who wishes to proceed.  2. HTN Stable No change required today  3. Moderate AS Follows with Dr Tamala Julian  4. Overweight Body mass index is 33.01 kg/m. Lifestyle modification advised  Risks, benefits and potential toxicities for medications prescribed and/or refilled reviewed with patient today.    Follow up in 6 month in the A-fib clinc  I,Alexis Bryant,acting as a scribe for [name], MD.,have documented all relevant documentation on the behalf of [name], MD,as directed by  [name], MD while in the presence of [name], MD.   I, [name], MD, have reviewed all documentation for this visit. The documentation on 02/22/21 for the exam, diagnosis, procedures, and orders are all accurate and complete.   [name] MD, [facility] 02/22/2021 11:15 AM   PROCEDURES:   1. Implantable loop recorder explantation       DESCRIPTION OF PROCEDURE:  Informed written consent was obtained.  The patient required no sedation for the procedure today.   The patients left chest was therefore prepped and draped in the usual sterile fashion.  The skin overlying the ILR monitor was infiltrated with lidocaine for local analgesia.  A 0.5-cm incision was made over the site.  The previously implanted ILR was  exposed and removed using a combination of sharp and blunt dissection.  Steri- Strips and a sterile dressing were then applied. EBL<61ml.  There were no early apparent complications.     CONCLUSIONS:   1. Successful explantation of a [employer] Reveal LINQ implantable loop  recorder   2. No early apparent complications.        [name] MD, [facility] 02/22/2021 11:47 AM

## 2021-02-22 NOTE — Patient Instructions (Addendum)
Medication Instructions:  Your physician recommends that you continue on your current medications as directed. Please refer to the Current Medication list given to you today.  Labwork: None ordered.  Testing/Procedures: None ordered.  Follow-Up:  Your physician wants you to follow-up in: 6 months with the Afib Clinic. They will contact you to schedule.    Implantable Loop Recorder Removal, Care After This sheet gives you information about how to care for yourself after your procedure. Your health care provider may also give you more specific instructions. If you have problems or questions, contact your health care provider. What can I expect after the procedure? After the procedure, it is common to have:  Soreness or discomfort near the incision.  Some swelling or bruising near the incision.  Follow these instructions at home: Incision care  1.  Leave your outer dressing on for 24 hours.  After 24 hours you can remove your outer dressing and shower. 2. Leave adhesive strips in place. These skin closures may need to stay in place for 1-2 weeks. If adhesive strip edges start to loosen and curl up, you may trim the loose edges.  You may remove the strips if they have not fallen off after 2 weeks. 3. Check your incision area every day for signs of infection. Check for: a. Redness, swelling, or pain. b. Fluid or blood. c. Warmth. d. Pus or a bad smell. 4. Do not take baths, swim, or use a hot tub until your incision is completely healed. 5. If your wound site starts to bleed apply pressure.      If you have any questions/concerns please call the device clinic at [phone].  Activity  Return to your normal activities.  Contact a health care provider if:  You have redness, swelling, or pain around your incision.  You have a fever.

## 2021-03-01 ENCOUNTER — Telehealth: Payer: Self-pay

## 2021-03-01 DIAGNOSIS — I480 Paroxysmal atrial fibrillation: Secondary | ICD-10-CM

## 2021-03-01 MED ORDER — DILTIAZEM HCL ER BEADS 180 MG PO CP24
180.0000 mg | ORAL_CAPSULE | Freq: Every day | ORAL | 3 refills | Status: DC
Start: 2021-03-01 — End: 2021-09-24

## 2021-03-01 NOTE — Telephone Encounter (Signed)
Pt called regarding his Diltiazem refill, advised it was sent to wrong pharmacy, said hasn't had anything filled there in over a year.  Also would like It sent to correct pharmacy for 90 days not 60 days please to [place].  He wanted me to let you know that he has prostate cancer and is seeing lots of doctors and having lots of appointments right now, just saw cardiologist twice and couldn't really commit to scheduling anything here at this time.

## 2021-03-02 ENCOUNTER — Telehealth: Payer: Self-pay | Admitting: *Deleted

## 2021-03-02 ENCOUNTER — Other Ambulatory Visit: Payer: Self-pay

## 2021-03-02 ENCOUNTER — Encounter (HOSPITAL_BASED_OUTPATIENT_CLINIC_OR_DEPARTMENT_OTHER): Payer: Self-pay | Admitting: Urology

## 2021-03-02 NOTE — Progress Notes (Addendum)
ADDENDUM:  Chart reviewed w/ anesthesia, Dr Collins [place] MDA,  Stated ok to proceed.   Spoke w/ via phone for pre-op interview--- PT Lab needs dos----  no             Lab results------ pt getting CBC, CMP, PT/ PTT done 03-05-2021 @ 1000 COVID test ------  03-05-2021 @ 1130  Arrive at -------  0530 on 03-08-2021 NPO after MN NO Solid Food.  Clear liquids from MN until--- 0430 Med rec completed Medications to take morning of surgery ----- Toprol Diabetic medication ----- do not take metformin morning of surgery  Patient instructed to bring photo id and insurance card day of surgery Patient aware to have Driver (ride ) / caregiver  for 24 hours after surgery-- wife, Rhoda   Patient Special Instructions ----- pt to do one fleet enema morning of surgery and asked to bring rescue inhaler dos Pre-Op special Istructions ----- pt has cardiac clearance office note by Melina Copa PA dated 02-15-2021 in epic/ chart  Patient verbalized understanding of instructions that were given at this phone interview. Patient denies shortness of breath, chest pain, fever, cough at this phone interview.   Anesthesia Review:  HTN;  PAF s/p ablation 2016;  Moderate AV stenosis;  Mild CAD;  Hx chronic bronchitis;  Pt denies any cardiac s&s, sob, and no peripheral.  PCP:  Dr Glo Herring (08-03-2020 epic) Cardiologist : Dr Linard Millers (lov 02-15-2021 epic) EP: Dr [name]  Cassell Clement 02-22-2021 epic)  All results in epic Chest x-ray :  02-12-2021  EKG :  02-15-2021 Echo :   01-13-2021 Stress test: 04-14-2017 Cardiac Cath : 09-19-2018  Activity level:  Denies sob w/ any activity  Sleep Study/ CPAP :  Yes/  Refused cpap (moderate osa per study 03-08-2015) Fasting Blood Sugar :      / Checks Blood Sugar -- times a day:  Stated does not check Blood Thinner/ Instructions Maryjane Hurter Dose: Eliquis ASA / Instructions/ Last Dose : NO  Per pt was given instructions from cardiology to stop 2 days prior to surgery, stated last dose will be  03-05-2021 evening.

## 2021-03-02 NOTE — Telephone Encounter (Signed)
CALLED PATIENT TO REMIND OF LAB AND COVID TESTING FOR 03-05-21, SPOKE WITH PATIENT AND HE IS AWARE OF THESE APPTS.

## 2021-03-04 ENCOUNTER — Telehealth: Payer: Self-pay

## 2021-03-04 NOTE — Telephone Encounter (Signed)
Spoke to pt and he advised he is going will schedule in sept due to all the appointments he has coming up and surgeries. [place]

## 2021-03-04 NOTE — Telephone Encounter (Signed)
[facility] closed [facility]

## 2021-03-05 ENCOUNTER — Telehealth: Payer: Self-pay | Admitting: *Deleted

## 2021-03-05 ENCOUNTER — Other Ambulatory Visit (HOSPITAL_COMMUNITY)
Admission: RE | Admit: 2021-03-05 | Discharge: 2021-03-05 | Disposition: A | Discharge status: Home / Self Care | Payer: PPO | Source: Ambulatory Visit | Attending: Urology | Admitting: Urology

## 2021-03-05 ENCOUNTER — Other Ambulatory Visit: Payer: Self-pay

## 2021-03-05 ENCOUNTER — Encounter (HOSPITAL_COMMUNITY)
Admission: RE | Admit: 2021-03-05 | Discharge: 2021-03-05 | Disposition: A | Discharge status: Home / Self Care | Payer: PPO | Source: Ambulatory Visit | Attending: Urology | Admitting: Urology

## 2021-03-05 DIAGNOSIS — Z01812 Encounter for preprocedural laboratory examination: Secondary | ICD-10-CM | POA: Insufficient documentation

## 2021-03-05 DIAGNOSIS — Z20822 Contact with and (suspected) exposure to covid-19: Secondary | ICD-10-CM | POA: Insufficient documentation

## 2021-03-05 LAB — COMPREHENSIVE METABOLIC PANEL
ALT: 36 U/L (ref 0–44)
AST: 30 U/L (ref 15–41)
Albumin: 4.1 g/dL (ref 3.5–5.0)
Alkaline Phosphatase: 68 U/L (ref 38–126)
Anion gap: 9 (ref 5–15)
BUN: 21 mg/dL (ref 8–23)
CO2: 25 mmol/L (ref 22–32)
Calcium: 9.3 mg/dL (ref 8.9–10.3)
Chloride: 104 mmol/L (ref 98–111)
Creatinine, Ser: 0.73 mg/dL (ref 0.61–1.24)
GFR, Estimated: >60 mL/min (ref >60)
Glucose, Bld: 159 mg/dL — ABNORMAL HIGH (ref 70–99)
Potassium: 4.3 mmol/L (ref 3.5–5.1)
Sodium: 138 mmol/L (ref 135–145)
Total Bilirubin: 0.7 mg/dL (ref 0.3–1.2)
Total Protein: 7.6 g/dL (ref 6.5–8.1)

## 2021-03-05 LAB — CBC
HCT: 42 % (ref 39.0–52.0)
Hemoglobin: 14.4 g/dL (ref 13.0–17.0)
MCH: 32.7 pg (ref 26.0–34.0)
MCHC: 34.3 g/dL (ref 30.0–36.0)
MCV: 95.5 fL (ref 80.0–100.0)
Platelets: 194 10*3/uL (ref 150–400)
RBC: 4.4 MIL/uL (ref 4.22–5.81)
RDW: 12.7 % (ref 11.5–15.5)
WBC: 11.8 10*3/uL — ABNORMAL HIGH (ref 4.0–10.5)
nRBC: 0 % (ref 0.0–0.2)

## 2021-03-05 LAB — PROTIME-INR
INR: 1.1 (ref 0.8–1.2)
Prothrombin Time: 14.6 seconds (ref 11.4–15.2)

## 2021-03-05 LAB — APTT: aPTT: 35 seconds (ref 24–36)

## 2021-03-05 NOTE — Telephone Encounter (Signed)
CALLED PATIENT TO REMIND OF PROCEDURE FOR 03-08-21, SPOKE WITH PATIENT AND HE IS AWARE OF THIS PROCEDURE

## 2021-03-06 LAB — SARS CORONAVIRUS 2 (TAT 6-24 HRS): SARS Coronavirus 2: NEGATIVE

## 2021-03-08 ENCOUNTER — Encounter (HOSPITAL_BASED_OUTPATIENT_CLINIC_OR_DEPARTMENT_OTHER): Payer: Self-pay | Admitting: Urology

## 2021-03-08 ENCOUNTER — Ambulatory Visit (HOSPITAL_BASED_OUTPATIENT_CLINIC_OR_DEPARTMENT_OTHER): Payer: PPO | Admitting: Anesthesiology

## 2021-03-08 ENCOUNTER — Ambulatory Visit (HOSPITAL_COMMUNITY): Payer: PPO

## 2021-03-08 ENCOUNTER — Ambulatory Visit (HOSPITAL_BASED_OUTPATIENT_CLINIC_OR_DEPARTMENT_OTHER)
Admission: RE | Admit: 2021-03-08 | Discharge: 2021-03-08 | Disposition: A | Discharge status: Home / Self Care | Payer: PPO | Source: Ambulatory Visit | Attending: Urology | Admitting: Urology

## 2021-03-08 ENCOUNTER — Other Ambulatory Visit: Payer: Self-pay

## 2021-03-08 ENCOUNTER — Encounter (HOSPITAL_BASED_OUTPATIENT_CLINIC_OR_DEPARTMENT_OTHER)
Admission: RE | Disposition: A | Discharge status: Home / Self Care | Payer: Self-pay | Source: Ambulatory Visit | Attending: Urology

## 2021-03-08 DIAGNOSIS — Z79899 Other long term (current) drug therapy: Secondary | ICD-10-CM | POA: Insufficient documentation

## 2021-03-08 DIAGNOSIS — Z86718 Personal history of other venous thrombosis and embolism: Secondary | ICD-10-CM | POA: Insufficient documentation

## 2021-03-08 DIAGNOSIS — Z96653 Presence of artificial knee joint, bilateral: Secondary | ICD-10-CM | POA: Insufficient documentation

## 2021-03-08 DIAGNOSIS — N210 Calculus in bladder: Secondary | ICD-10-CM | POA: Diagnosis not present

## 2021-03-08 DIAGNOSIS — Z9119 Patient's noncompliance with other medical treatment and regimen: Secondary | ICD-10-CM | POA: Insufficient documentation

## 2021-03-08 DIAGNOSIS — I350 Nonrheumatic aortic (valve) stenosis: Secondary | ICD-10-CM | POA: Diagnosis not present

## 2021-03-08 DIAGNOSIS — C61 Malignant neoplasm of prostate: Secondary | ICD-10-CM | POA: Diagnosis not present

## 2021-03-08 DIAGNOSIS — Z885 Allergy status to narcotic agent status: Secondary | ICD-10-CM | POA: Diagnosis not present

## 2021-03-08 DIAGNOSIS — I480 Paroxysmal atrial fibrillation: Secondary | ICD-10-CM | POA: Diagnosis not present

## 2021-03-08 HISTORY — DX: Personal history of urinary calculi: Z87.442

## 2021-03-08 HISTORY — DX: Unspecified osteoarthritis, unspecified site: M19.90

## 2021-03-08 HISTORY — DX: Umbilical hernia without obstruction or gangrene: K42.9

## 2021-03-08 HISTORY — DX: Personal history of other diseases of the digestive system: Z87.19

## 2021-03-08 HISTORY — PX: SPACE OAR INSTILLATION: SHX6769

## 2021-03-08 HISTORY — DX: Obstructive sleep apnea (adult) (pediatric): G47.33

## 2021-03-08 HISTORY — DX: Personal history of other diseases of the respiratory system: Z87.09

## 2021-03-08 HISTORY — DX: Benign prostatic hyperplasia without lower urinary tract symptoms: N40.0

## 2021-03-08 HISTORY — DX: Essential (primary) hypertension: I10

## 2021-03-08 HISTORY — PX: CYSTOSCOPY: SHX5120

## 2021-03-08 HISTORY — DX: Type 2 diabetes mellitus without complications: E11.9

## 2021-03-08 HISTORY — DX: Male erectile dysfunction, unspecified: N52.9

## 2021-03-08 HISTORY — DX: Calculus of kidney: N20.0

## 2021-03-08 HISTORY — PX: RADIOACTIVE SEED IMPLANT: SHX5150

## 2021-03-08 LAB — GLUCOSE, CAPILLARY
Glucose-Capillary: 153 mg/dL — ABNORMAL HIGH (ref 70–99)
Glucose-Capillary: 167 mg/dL — ABNORMAL HIGH (ref 70–99)

## 2021-03-08 SURGERY — INSERTION, RADIATION SOURCE, PROSTATE
Anesthesia: General | Site: Rectum

## 2021-03-08 MED ORDER — DEXAMETHASONE SODIUM PHOSPHATE 10 MG/ML IJ SOLN
INTRAMUSCULAR | Status: DC | PRN
  Administered 2021-03-08: 4 mg via INTRAVENOUS

## 2021-03-08 MED ORDER — HYDROCODONE-ACETAMINOPHEN 7.5-325 MG PO TABS: 1.0000 | ORAL_TABLET | Freq: Once | ORAL | Status: DC | PRN

## 2021-03-08 MED ORDER — EPHEDRINE 5 MG/ML INJ
INTRAVENOUS | Status: AC
  Filled 2021-03-08: qty 10

## 2021-03-08 MED ORDER — PROPOFOL 10 MG/ML IV BOLUS
INTRAVENOUS | Status: AC
  Filled 2021-03-08: qty 40

## 2021-03-08 MED ORDER — LACTATED RINGERS IV SOLN
INTRAVENOUS | Status: DC
  Administered 2021-03-08: 1000 mL via INTRAVENOUS

## 2021-03-08 MED ORDER — SODIUM CHLORIDE 0.9 % IV SOLN
INTRAVENOUS | Status: AC | PRN
  Administered 2021-03-08: 150 mL

## 2021-03-08 MED ORDER — FLEET ENEMA 7-19 GM/118ML RE ENEM: 1.0000 | ENEMA | Freq: Once | RECTAL | Status: DC

## 2021-03-08 MED ORDER — MIDAZOLAM HCL 2 MG/2ML IJ SOLN
INTRAMUSCULAR | Status: AC
  Filled 2021-03-08: qty 2

## 2021-03-08 MED ORDER — STERILE WATER FOR IRRIGATION IR SOLN
Status: DC | PRN
  Administered 2021-03-08: 3 mL

## 2021-03-08 MED ORDER — DEXAMETHASONE SODIUM PHOSPHATE 10 MG/ML IJ SOLN
INTRAMUSCULAR | Status: AC
  Filled 2021-03-08: qty 1

## 2021-03-08 MED ORDER — ACETAMINOPHEN 500 MG PO TABS
1000.0000 mg | ORAL_TABLET | Freq: Once | ORAL | Status: AC
  Administered 2021-03-08: 1000 mg via ORAL

## 2021-03-08 MED ORDER — FENTANYL CITRATE (PF) 100 MCG/2ML IJ SOLN: 25.0000 ug | INTRAMUSCULAR | Status: DC | PRN

## 2021-03-08 MED ORDER — ONDANSETRON HCL 4 MG/2ML IJ SOLN
INTRAMUSCULAR | Status: DC | PRN
  Administered 2021-03-08: 4 mg via INTRAVENOUS

## 2021-03-08 MED ORDER — FENTANYL CITRATE (PF) 100 MCG/2ML IJ SOLN
INTRAMUSCULAR | Status: DC | PRN
  Administered 2021-03-08: 25 ug via INTRAVENOUS
  Administered 2021-03-08: 50 ug via INTRAVENOUS
  Administered 2021-03-08: 25 ug via INTRAVENOUS

## 2021-03-08 MED ORDER — ONDANSETRON HCL 4 MG/2ML IJ SOLN
INTRAMUSCULAR | Status: AC
  Filled 2021-03-08: qty 2

## 2021-03-08 MED ORDER — SODIUM CHLORIDE 0.9 % IV SOLN
INTRAVENOUS | Status: DC | PRN
  Administered 2021-03-08: 30 ug/min via INTRAVENOUS

## 2021-03-08 MED ORDER — CEFAZOLIN SODIUM-DEXTROSE 2-4 GM/100ML-% IV SOLN
2.0000 g | Freq: Once | INTRAVENOUS | Status: AC
  Administered 2021-03-08: 2 g via INTRAVENOUS

## 2021-03-08 MED ORDER — MIDAZOLAM HCL 2 MG/2ML IJ SOLN
INTRAMUSCULAR | Status: DC | PRN
  Administered 2021-03-08: 2 mg via INTRAVENOUS

## 2021-03-08 MED ORDER — ALBUTEROL SULFATE HFA 108 (90 BASE) MCG/ACT IN AERS
INHALATION_SPRAY | RESPIRATORY_TRACT | Status: DC | PRN
  Administered 2021-03-08: 6 via RESPIRATORY_TRACT

## 2021-03-08 MED ORDER — FENTANYL CITRATE (PF) 100 MCG/2ML IJ SOLN
INTRAMUSCULAR | Status: AC
  Filled 2021-03-08: qty 2

## 2021-03-08 MED ORDER — PHENYLEPHRINE 40 MCG/ML (10ML) SYRINGE FOR IV PUSH (FOR BLOOD PRESSURE SUPPORT)
PREFILLED_SYRINGE | INTRAVENOUS | Status: AC
  Filled 2021-03-08: qty 10

## 2021-03-08 MED ORDER — EPHEDRINE SULFATE 50 MG/ML IJ SOLN
INTRAMUSCULAR | Status: DC | PRN
  Administered 2021-03-08: 5 mg via INTRAVENOUS
  Administered 2021-03-08: 10 mg via INTRAVENOUS
  Administered 2021-03-08: 5 mg via INTRAVENOUS
  Administered 2021-03-08: 10 mg via INTRAVENOUS
  Administered 2021-03-08 (×2): 5 mg via INTRAVENOUS
  Administered 2021-03-08: 15 mg via INTRAVENOUS
  Administered 2021-03-08: 5 mg via INTRAVENOUS

## 2021-03-08 MED ORDER — SULFAMETHOXAZOLE-TRIMETHOPRIM 800-160 MG PO TABS: 1.0000 | ORAL_TABLET | Freq: Two times a day (BID) | ORAL | 0 refills | Status: DC

## 2021-03-08 MED ORDER — PHENYLEPHRINE HCL (PRESSORS) 10 MG/ML IV SOLN
INTRAVENOUS | Status: AC
  Filled 2021-03-08: qty 1

## 2021-03-08 MED ORDER — ONDANSETRON HCL 4 MG/2ML IJ SOLN: 4.0000 mg | Freq: Once | INTRAMUSCULAR | Status: DC | PRN

## 2021-03-08 MED ORDER — PROPOFOL 10 MG/ML IV BOLUS
INTRAVENOUS | Status: DC | PRN
  Administered 2021-03-08: 25 mg via INTRAVENOUS
  Administered 2021-03-08 (×2): 10 mg via INTRAVENOUS
  Administered 2021-03-08: 25 mg via INTRAVENOUS
  Administered 2021-03-08: 100 mg via INTRAVENOUS

## 2021-03-08 MED ORDER — ARTIFICIAL TEARS OPHTHALMIC OINT
TOPICAL_OINTMENT | OPHTHALMIC | Status: AC
  Filled 2021-03-08: qty 3.5

## 2021-03-08 MED ORDER — IOHEXOL 300 MG/ML  SOLN
INTRAMUSCULAR | Status: DC | PRN
  Administered 2021-03-08: 7 mL via URETHRAL

## 2021-03-08 MED ORDER — LIDOCAINE 2% (20 MG/ML) 5 ML SYRINGE
INTRAMUSCULAR | Status: DC | PRN
  Administered 2021-03-08: 60 mg via INTRAVENOUS

## 2021-03-08 MED ORDER — ACETAMINOPHEN 500 MG PO TABS
ORAL_TABLET | ORAL | Status: AC
  Filled 2021-03-08: qty 2

## 2021-03-08 MED ORDER — LIDOCAINE 2% (20 MG/ML) 5 ML SYRINGE
INTRAMUSCULAR | Status: AC
  Filled 2021-03-08: qty 5

## 2021-03-08 MED ORDER — CEFAZOLIN SODIUM-DEXTROSE 2-4 GM/100ML-% IV SOLN
INTRAVENOUS | Status: AC
  Filled 2021-03-08: qty 100

## 2021-03-08 MED ORDER — SODIUM CHLORIDE (PF) 0.9 % IJ SOLN
INTRAMUSCULAR | Status: DC | PRN
  Administered 2021-03-08: 3 mL via INTRAVENOUS

## 2021-03-08 MED ORDER — PHENYLEPHRINE HCL (PRESSORS) 10 MG/ML IV SOLN
INTRAVENOUS | Status: DC | PRN
  Administered 2021-03-08: 80 ug via INTRAVENOUS
  Administered 2021-03-08: 120 ug via INTRAVENOUS
  Administered 2021-03-08: 80 ug via INTRAVENOUS
  Administered 2021-03-08: 40 ug via INTRAVENOUS
  Administered 2021-03-08: 80 ug via INTRAVENOUS

## 2021-03-08 SURGICAL SUPPLY — 40 items
BAG DRN RND TRDRP ANRFLXCHMBR (UROLOGICAL SUPPLIES) ×3
BAG URINE DRAIN 2000ML AR STRL (UROLOGICAL SUPPLIES) ×4 IMPLANT
BASKET ZERO TIP NITINOL 2.4FR (BASKET) ×4 IMPLANT
BLADE CLIPPER SENSICLIP SURGIC (BLADE) ×4 IMPLANT
BSKT STON RTRVL ZERO TP 2.4FR (BASKET) ×3
CATH FOLEY 2WAY SLVR  5CC 16FR (CATHETERS) ×4
CATH FOLEY 2WAY SLVR 5CC 16FR (CATHETERS) ×3 IMPLANT
CATH ROBINSON RED A/P 16FR (CATHETERS) IMPLANT
CATH ROBINSON RED A/P 20FR (CATHETERS) ×4 IMPLANT
CLOTH BEACON ORANGE TIMEOUT ST (SAFETY) ×4 IMPLANT
CNTNR URN SCR LID CUP LEK RST (MISCELLANEOUS) ×6 IMPLANT
CONT SPEC 4OZ STRL OR WHT (MISCELLANEOUS) ×8
COVER BACK TABLE 60X90IN (DRAPES) ×4 IMPLANT
COVER MAYO STAND STRL (DRAPES) ×4 IMPLANT
DRAPE C-ARM 35X43 STRL (DRAPES) IMPLANT
DRSG TEGADERM 4X4.75 (GAUZE/BANDAGES/DRESSINGS) IMPLANT
DRSG TEGADERM 8X12 (GAUZE/BANDAGES/DRESSINGS) ×4 IMPLANT
GLOVE SURG ENC MOIS LTX SZ6.5 (GLOVE) IMPLANT
GLOVE SURG ENC MOIS LTX SZ7.5 (GLOVE) ×4 IMPLANT
GLOVE SURG ENC MOIS LTX SZ8 (GLOVE) ×4 IMPLANT
GLOVE SURG ORTHO LTX SZ8.5 (GLOVE) ×12 IMPLANT
GLOVE SURG POLYISO LF SZ6.5 (GLOVE) ×4 IMPLANT
GLOVE SURG UNDER POLY LF SZ7 (GLOVE) ×4 IMPLANT
GLOVE SURG UNDER POLY LF SZ7.5 (GLOVE) ×4 IMPLANT
GOWN STRL REUS W/TWL LRG LVL3 (GOWN DISPOSABLE) ×8 IMPLANT
GOWN STRL REUS W/TWL XL LVL3 (GOWN DISPOSABLE) ×8 IMPLANT
HOLDER FOLEY CATH W/STRAP (MISCELLANEOUS) IMPLANT
I-SEED AGX100 ×264 IMPLANT
IMPL SPACEOAR VUE SYSTEM (Spacer) ×3 IMPLANT
IMPLANT SPACEOAR VUE SYSTEM (Spacer) ×4 IMPLANT
IV NS 1000ML (IV SOLUTION) ×4
IV NS 1000ML BAXH (IV SOLUTION) ×3 IMPLANT
KIT TURNOVER CYSTO (KITS) ×4 IMPLANT
MARKER SKIN DUAL TIP RULER LAB (MISCELLANEOUS) ×4 IMPLANT
PACK CYSTO (CUSTOM PROCEDURE TRAY) ×4 IMPLANT
SUT BONE WAX W31G (SUTURE) IMPLANT
SYR 10ML LL (SYRINGE) ×4 IMPLANT
TOWEL OR 17X26 10 PK STRL BLUE (TOWEL DISPOSABLE) ×4 IMPLANT
UNDERPAD 30X36 HEAVY ABSORB (UNDERPADS AND DIAPERS) ×8 IMPLANT
WATER STERILE IRR 500ML POUR (IV SOLUTION) ×4 IMPLANT

## 2021-03-08 NOTE — Op Note (Signed)
Preoperative diagnosis: Clinical stage TI C adenocarcinoma the prostate   Postoperative diagnosis: Same, bladder calculi  Procedure: I-125 prostate seed implantation, SpaceOAR placement, flexible cystoscopy, extraction of bladder calculi  Surgeon: Lillette Boxer. Chantz Montefusco M.D.  Radiation Oncologist: Tyler Pita, M.D.  Anesthesia: Gen.  Specimens: Bladder calculi  Indications: Patient  was diagnosed with clinical stage TI C prostate cancer. We had extensive discussion with him about treatment options versus. He elected to proceed with seed implantation. He underwent consultation my office as well as with Dr. Tammi Klippel. He appeared to understand the advantages disadvantages potential risks of this treatment option. Full informed consent has been obtained.   Technique and findings: Patient was brought the operating room where he had successful induction of general anesthesia. He was placed in dorso-lithotomy position and prepped and draped in usual manner. Appropriate surgical timeout was performed. Radiation oncology department placed a transrectal ultrasound probe anchoring stand. Foley catheter with contrast in the balloon was inserted without difficulty. Anchoring needles were placed within the prostate. Rectal tube was placed. Real-time contouring of the urethra prostate and rectum were performed and the dosing parameters were established. Targeted dose was 145 gray.  I was then called  to the operating suite suite for placement of the needles. A second timeout was performed. All needle passage was done with real-time transrectal ultrasound guidance with the sagittal plane. A total of 19 needles were placed.  66 active seeds were implanted.  I then proceeded with placement of SpaceOAR by introducing a needle with the bevel angled inferiorly approximately 2 cm superior to the anus. This was angled downward and under direct ultrasound was placed within the space between the prostatic capsule and  rectum. This was confirmed with a small amount of sterile saline injected and this was performed under direct ultrasound. I then attached the SpaceOAR to the needle and injected this in the space between the prostate and rectum with good placement noted. The Foley catheter was removed and flexible cystoscopy failed to show any seeds outside the prostate.  However, there were 2 bladder calculi, the largest of which was 8 mm in size.  These were removed using a 0 tip nitinol basket.  The patient was brought to recovery room in stable condition, having tolerated the procedure well.Marland Kitchen

## 2021-03-08 NOTE — Anesthesia Procedure Notes (Addendum)
Procedure Name: LMA Insertion Date/Time: 03/08/2021 7:46 AM Performed by: Mechele Claude, CRNA Pre-anesthesia Checklist: Patient identified, Emergency Drugs available, Suction available and Patient being monitored Patient Re-evaluated:Patient Re-evaluated prior to induction Oxygen Delivery Method: Circle system utilized Preoxygenation: Pre-oxygenation with 100% oxygen Induction Type: IV induction Ventilation: Mask ventilation without difficulty LMA: LMA with gastric port inserted LMA Size: 5.0 Number of attempts: 1 Airway Equipment and Method: Bite block Placement Confirmation: positive ETCO2 Tube secured with: Tape Dental Injury: Teeth and Oropharynx as per pre-operative assessment

## 2021-03-08 NOTE — Progress Notes (Signed)
  Radiation Oncology         [phone] ________________________________  Name: RYLEIGH BUENGER MRN: [id number]  Date: 03/08/2021  DOB: 1951/04/16       Prostate Seed Implant  TZ:[id number], [name], MD  No ref. provider found  DIAGNOSIS: 70 y.o. gentleman with Stage T1c adenocarcinoma of the prostate with Gleason score of 3+4, and PSA of 6.95.  Oncology History  Malignant neoplasm of prostate ([place])  01/31/2020 Initial Diagnosis   Prostate cancer ([place])   10/19/2020 Cancer Staging   Staging form: Prostate, AJCC 8th Edition - Clinical stage from 10/19/2020: Stage IIB (cT1c, cN0, cM0, PSA: 7, Grade Group: 2) - Signed by Freeman Caldron, PA-C on 12/15/2020 Histopathologic type: Adenocarcinoma, NOS Stage prefix: Initial diagnosis Prostate specific antigen (PSA) range: Less than 10 Gleason primary pattern: 3 Gleason secondary pattern: 4 Gleason score: 7 Histologic grading system: 5 grade system Number of biopsy cores examined: 18 Number of biopsy cores positive: 8 Location of positive needle core biopsies: One side     No diagnosis found.  PROCEDURE: Insertion of radioactive I-125 seeds into the prostate gland.  RADIATION DOSE: 145 Gy, definitive therapy.  TECHNIQUE: NIV DARLEY was brought to the operating room with the urologist. He was placed in the dorsolithotomy position. He was catheterized and a rectal tube was inserted. The perineum was shaved, prepped and draped. The ultrasound probe was then introduced into the rectum to see the prostate gland.  TREATMENT DEVICE: A needle grid was attached to the ultrasound probe stand and anchor needles were placed.  3D PLANNING: The prostate was imaged in 3D using a sagittal sweep of the prostate probe. These images were transferred to the planning computer. There, the prostate, urethra and rectum were defined on each axial reconstructed image. Then, the software created an optimized 3D plan and a few seed positions were adjusted. The quality  of the plan was reviewed using [facility] information for the target and the following two organs at risk:  Urethra and Rectum.  Then the accepted plan was printed and handed off to the radiation therapist.  Under my supervision, the custom loading of the seeds and spacers was carried out and loaded into sealed vicryl sleeves.  These pre-loaded needles were then placed into the needle holder.Marland Kitchen  PROSTATE VOLUME STUDY:  Using transrectal ultrasound the volume of the prostate was verified to be 63.5 cc.  SPECIAL TREATMENT PROCEDURE/SUPERVISION AND HANDLING: The pre-loaded needles were then delivered under sagittal guidance. A total of 19 needles were used to deposit 66 seeds in the prostate gland. The individual seed activity was 0.648 mCi.  SpaceOAR:  Yes  COMPLEX SIMULATION: At the end of the procedure, an anterior radiograph of the pelvis was obtained to document seed positioning and count. Cystoscopy was performed to check the urethra and bladder.  MICRODOSIMETRY: At the end of the procedure, the patient was emitting 0.064 mR/hr at 1 meter. Accordingly, he was considered safe for hospital discharge.  PLAN: The patient will return to the radiation oncology clinic for post implant CT dosimetry in three weeks.   ________________________________  Sheral Apley Tammi Klippel, M.D.

## 2021-03-08 NOTE — Anesthesia Postprocedure Evaluation (Signed)
Anesthesia Post Note  Patient: Jeremy Tucker  Procedure(s) Performed: RADIOACTIVE SEED IMPLANT/BRACHYTHERAPY IMPLANT (N/A Prostate) SPACE OAR INSTILLATION (N/A Rectum) CYSTOSCOPY BLADDER CALCULI EXTRACTION  (N/A Bladder)     Patient location during evaluation: PACU Anesthesia Type: General Level of consciousness: awake and alert, oriented and patient cooperative Pain management: pain level controlled Vital Signs Assessment: post-procedure vital signs reviewed and stable Respiratory status: spontaneous breathing, nonlabored ventilation and respiratory function stable Cardiovascular status: blood pressure returned to baseline and stable Postop Assessment: no apparent nausea or vomiting Anesthetic complications: no   No complications documented.  Last Vitals:  Vitals:   03/08/21 0918 03/08/21 0930  BP: 132/68 (!) 127/56  Pulse: 77 70  Resp: 14 19  Temp: 37.1 C   SpO2: 95% 97%    Last Pain:  Vitals:   03/08/21 0918  TempSrc:   PainSc: 0-No pain                 Pervis Hocking

## 2021-03-08 NOTE — Discharge Instructions (Signed)
Radioactive Seed Implant Home Care Instructions   Activity:    Rest for the remainder of the day.  Do not drive or operate equipment today.  You may resume normal  activities in a few days as instructed by your physician, without risk of harmful radiation exposure to those around you, provided you follow the time and distance precautions on the Radiation Oncology Instruction Sheet.   Meals: Drink plenty of lipuids and eat light foods, such as gelatin or soup this evening .  You may return to normal meal plan tomorrow.  Return To Work: You may return to work as instructed by your physician.  Special Instruction:   If any seeds are found, use tweezers to pick up seeds and place in a glass container of any kind and bring to your physician's office.  Call your physician if any of these symptoms occur:  Persistent or heavy bleeding Urine stream diminishes or stops completely after catheter is removed Fever equal to or greater than 101 degrees F Cloudy urine with a strong foul odor Severe pain  You may feel some burning pain and/or hesitancy when you urinate after the catheter is removed.  These symptoms may increase over the next few weeks, but should diminish within forur to six weeks.  Applying moist heat to the lower abdomen or a hot tub bath may help relieve the pain.  If the discomfort becomes severe, please call your physician for additional medications.  PROSTATE CANCER TREATMENT WITH RADIOACTIVE IODINE-125 SEED IMPLANT  This instruction sheet is intended to discuss implantation of Iodine-125 seeds as treatment for cancer of the prostate. It will explain in detail what you may expect from this treatment and what precautions are necessary as a result of the treatment. Iodine-125 emits a relatively low energy radiation. The radioactive seeds are surgically implanted directly into the prostate gland. Most of the radiation is contained within the prostate gland. A very small amount is  present outside the body.The precautions that we ask you to take are to ensure that those around you are protected from unnecessary radiation. The principles of radiation safety that you need to understand are:  DISTANCE: The further a person is from the radioactive implant the less radiation they will be receiving. The amount of radiation received falls off quite rapidly with distance. More specific guidelines are given in the table on the last page.  TIME: The amount of radiation a person is exposed to is directly proportional to the amount of time that is spent in close proximity to the radioactive implant. Very little radiation will be received during short periods. See the table on the last page for more specific guideline.  CHILDREN UNDER AGE 18 Children should not be allowed to sit on your lap or otherwise be in very close contact for more than a few minutes for the first 6-8 weeks following the implant. You may affectionately greet (hug/kiss) a child for a short period of time, but remember, the longer you are in close proximity with that child the more radiation they are being exposed to. At a distance of 6 feet there is no limit to the length of time you may spend together. See specific guidelines on the last page.  PREGNANT OR POSSIBLY PREGNANT WOMEN Pregnant women should avoid prolonged close physical contact with you for the first 6-8 weeks after implant. At a distance of 6 feet there is no limit to the length of time you may spend together. Pregnant women or possibly pregnant   women can safely be in close contact with you for a limited period of time. See the last page for guidelines.  FAMILY RELATIONS You may sleep in the same bed as your partner (provided she is not pregnant or under the age of 45). Sexual intercourse, using a condom, may be resumed 2 weeks after the implant. Your semen may be discolored, dark brown or black. This is normal and is the result of bleeding that may have  occurred during the implant. After 3-4 weeks it will not be necessary to use a condom.  DAILY ACTIVITIES You may resume normal activities in a few days (example: work, shopping, church) without the risk of harmful radiation exposure to those around you provided you keep in mind the time and distance precautions. Objects that you touch or item that you use do not become radioactive. Linens, clothing, tableware, and dishes may be used by other persons without special precautions. Your bodily wastes (urine and stool) are not radioactive.  SPECIAL PRECAUTIONS It is possible to lose implanted Iodine-125 seed(s) through urination. Although it is possible to pass seeds indefinitely, it is most likely to occur immediately after catheter removal. To prevent this from happening the catheter that was in place during the implant procedure is removed immediately after the implant and a cystoscopy procedure is performed. The process of removing the catheter and the cystoscopy procedure should dislodge and remove any seeds that are not firmly imbedded in the prostate tissue. However, you should watch for seeds if/when you remove your catheter at home. The seeds are silver colored and the size of a grain of rice. In the unlikely event that a seed is seen after urination, simply flush the seed down the toilet. The seed should not be handled with your fingers, not even with a glove or napkin. A spoon or tweezers can be used to pick up a seed. The Radiation Oncology department is open Monday - Friday from 8:00 am to 5:30 pm with a Radiation Oncologist on call at all times. He or she may be reached by calling [phone]. If you are to be hospitalized or if death should occur, your family should notify the Radiation Safety officer.  SIDE EFFECTS There are very few side effects associate with the implant procedure. Minor burning with urination, weak stream, hesitancy, intermittency, frequency, mild pain or feeling unable to  pass your urine freely are common and usually stop in one to four months. If these symptoms are extremely uncomfortable, contact your physician.  RADIATION SAFETY GUIDELINES PROSTATE CANCER TREATMENT WITH RADIOACTIVE IODINE-125 SEED IMPLANT  The following guidelines will limit exposure to less than naturally occurring background radiation.  PERSONS AGE 18-45 (if able to become pregnant)  FOR 8 WEEKS FOLLOWING IMPLANT  At a distance of 1 foot: limit time to less than 2 hours/week At a distance of 3 feet: limit time to 20 hours/week At a distance of 6 feet: no restrictions  AFTER 8 WEEKS No restrictions  CHILDREN UNDER AGE 18, PREGNANT WOMEN OR POSSIBLY PREGNANT WOMEN  FOR 8 WEEKS FOLLOWING IMPLANT At a distance of 1 foot: limit time to 10 minutes/week At a distance of 3 feet: limit time to 2 hours/week At a distance of 6 feet: no restrictions  AFTER 8 WEEKS No restrictions  PERSONS OVER THE AGE OF 45 AND DO NOT EXPECT TO HAVE ANY MORE CHILDREN No restrictions  Updated by [facility] in January 2020      Post Anesthesia Home Care Instructions  Activity: Get   plenty of rest for the remainder of the day. A responsible individual must stay with you for 24 hours following the procedure.  For the next 24 hours, DO NOT: -Drive a car -Operate machinery -Drink alcoholic beverages -Take any medication unless instructed by your physician -Make any legal decisions or sign important papers.  Meals: Start with liquid foods such as gelatin or soup. Progress to regular foods as tolerated. Avoid greasy, spicy, heavy foods. If nausea and/or vomiting occur, drink only clear liquids until the nausea and/or vomiting subsides. Call your physician if vomiting continues.  Special Instructions/Symptoms: Your throat may feel dry or sore from the anesthesia or the breathing tube placed in your throat during surgery. If this causes discomfort, gargle with warm salt water. The discomfort should  disappear within 24 hours.  

## 2021-03-08 NOTE — Transfer of Care (Signed)
Immediate Anesthesia Transfer of Care Note  Patient: Jeremy Tucker  Procedure(s) Performed: Procedure(s) (LRB): RADIOACTIVE SEED IMPLANT/BRACHYTHERAPY IMPLANT (N/A) SPACE OAR INSTILLATION (N/A) CYSTOSCOPY BLADDER CALCULI EXTRACTION  (N/A)  Patient Location: PACU  Anesthesia Type: General  Level of Consciousness: awake, alert  and oriented  Airway & Oxygen Therapy: Patient Spontanous Breathing and Patient connected to face mask oxygen  Post-op Assessment: Report given to PACU RN and Post -op Vital signs reviewed and stable  Post vital signs: Reviewed and stable  Complications: No apparent anesthesia complications  Last Vitals:  Vitals Value Taken Time  BP 132/68 03/08/21 0918  Temp 37.1 C 03/08/21 0918  Pulse 72 03/08/21 0921  Resp 14 03/08/21 0921  SpO2 97 % 03/08/21 0921  Vitals shown include unvalidated device data.  Last Pain:  Vitals:   03/08/21 0601  TempSrc: Oral  PainSc: 0-No pain      Patients Stated Pain Goal: 6 (48/25/00 3704)  Complications: No complications documented.

## 2021-03-08 NOTE — H&P (Signed)
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  Past Medical History:  Diagnosis Date  . BPH (benign prostatic hyperplasia)   . BPH without obstruction/lower urinary tract symptoms   . Depression   . Diabetic peripheral neuropathy ([place]) 01/07/2019  . ED (erectile dysfunction)   . Essential hypertension   . GERD (gastroesophageal reflux disease)   . Hiatal hernia   . History of chronic bronchitis    per pt used inhaler as needed  . History of DVT of lower extremity 2001   s/p  left TKA completed blood thinner treatment,  per pt no previous clot and none since  . History of gastric polyp    per pt benign  . History of kidney stones   . Hyperlipidemia   . Mild CAD cardiologist--- dr h. Tamala Julian   nuclear study 04-14-2017 normal , low risk, nuclear ef 53%;  cardiac cath 09-19-2018  midRCA 30% stenosis otherwise normal coronaries,  heavy AV calicification with decreased mobility with peak grandiant 52mmHg  . Mild dilation of ascending aorta (H[facility])    per echo 01-13-2021  45mm  . Nephrolithiasis   . OA (osteoarthritis)   . OSA (obstructive sleep apnea)    per pt refused cpap, intolerant;  moderate osa per study in epic 03-08-2015  . PAF (paroxysmal atrial fibrillation) ([place])    followed by dr h. 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 and ep ablation 04/ 2016  . 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 Prostate cancer [facility]) urologist--- dr Keshauna Degraffenreid/  oncology-- dr Tammi Klippel   first dx 03/ 2021,  [place] 3+4  PSA 6.95  . Pulmonary nodule 2019   incidently finding on CTA 09-25-2018, stable  . Right carpal tunnel syndrome 01/07/2019  . Type 2 diabetes mellitus ([place])    followed by pcp   (03-02-2021  pt stated does not check blood sugar at home)  . Umbilical hernia     Past Surgical History:  Procedure Laterality Date  . ATRIAL FIBRILLATION ABLATION N/A 03/12/2015   Procedure: ATRIAL FIBRILLATION  ABLATION;  Surgeon: [name], MD;  Location: [facility] CATH LAB;  Service: Cardiovascular;  Laterality: N/A;  . CARDIAC CATHETERIZATION  1990's   "Dr. Melvern Banker", no blockages per pt  . CARDIOVERSION N/A 02/25/2015   Procedure: CARDIOVERSION;  Surgeon: Sueanne Margarita, MD;  Location: [place];  Service: Cardiovascular;  Laterality: N/A;  . CATARACT EXTRACTION W/ INTRAOCULAR LENS  IMPLANT, BILATERAL  2016  . COLONOSCOPY  last one 2013  dr Earlean Shawl  . CYSTOSCOPY WITH RETROGRADE PYELOGRAM, URETEROSCOPY AND STENT PLACEMENT Left 04/24/2015   Procedure: URETHRAL MEATAL DILATION, CYSTOSCOPY WITH LEFT RETROGRADE PYELOGRAM, LEFT URETEROSCOPY, STONE BASKET EXTRACTION,  LEFT DOUBLE J STENT PLACEMENT;  Surgeon: Carolan Clines, MD;  Location: [facility];  Service: Urology;  Laterality: Left;  . EP IMPLANTABLE DEVICE N/A 09/15/2015   Procedure: Loop Recorder Insertion;  Surgeon: [name], MD;  Location: [place] CV LAB;  Service: Cardiovascular;  Laterality: N/A;  . ESOPHAGOGASTRODUODENOSCOPY  last one 2017  . EXTRACORPOREAL SHOCK WAVE LITHOTRIPSY Bilateral 04/02/2020   Procedure: EXTRACORPOREAL SHOCK WAVE LITHOTRIPSY (ESWL);  Surgeon: Cleon Gustin, MD;  Location: [facility];  Service: Urology;  Laterality: Bilateral;  . EXTRACORPOREAL SHOCK WAVE LITHOTRIPSY Left 04/06/2020   Procedure: EXTRACORPOREAL SHOCK WAVE LITHOTRIPSY (ESWL);  Surgeon: [name], MD;  Location[facility];  Service: Urology;  Laterality:  Left;  . HOLMIUM LASER APPLICATION Left 8/65/7846   Procedure: HOLMIUM LASER APPLICATION;  Surgeon: Carolan Clines, MD;  Location: [facility];  Service: Urology;  Laterality: Left;  . implantable loopr recorder removal  02/22/2021   MDT LINQ removed in office by Dr [name]  . KNEE ARTHROSCOPY Bilateral "multiple times"  . LEFT HEART CATH AND CORONARY ANGIOGRAPHY N/A 09/19/2018   Procedure: LEFT HEART CATH AND CORONARY ANGIOGRAPHY;  Surgeon: Belva Crome,  MD;  Location: [place] CV LAB;  Service: Cardiovascular;  Laterality: N/A;  . PERCUTANEOUS NEPHROSTOLITHOTOMY  1980s and 1990s  . SKIN BIOPSY Left 05/06/2020   Shave biopsy left posterior hand neurofiberoma.  . TEE WITHOUT CARDIOVERSION N/A 03/12/2015   Procedure: TRANSESOPHAGEAL ECHOCARDIOGRAM (TEE);  Surgeon: Pixie Casino, MD;  Location: [facility] ENDOSCOPY;  Service: Cardiovascular;  Laterality: N/A;  . TONSILLECTOMY  age 71  . TOTAL KNEE ARTHROPLASTY Left 11-22-1999  @[facility]   . TOTAL KNEE ARTHROPLASTY Right 10/30/2017   Procedure: RIGHT TOTAL KNEE ARTHROPLASTY;  Surgeon: Paralee Cancel, MD;  Location: [facility];  Service: Orthopedics;  Laterality: Right;  90 mins    Home Medications:    Allergies:  Allergies  Allergen Reactions  . Oxycodone Itching    Family History  Problem Relation Age of Onset  . Heart attack Mother 41       MI  . Heart attack Father 65       ? MI versus trauma to head  . Healthy Brother   . Cancer Maternal Grandfather        had prostate removed  . Breast cancer Neg Hx   . Colon cancer Neg Hx   . Pancreatic cancer Neg Hx   . Prostate cancer Neg Hx     Social History:  reports that he has never smoked. He has never used smokeless tobacco. He reports current alcohol use. He reports that he does not use drugs.  ROS: A complete review of systems was performed.  All systems are negative except for pertinent findings as noted.  Physical Exam:  Vital signs in last 24 hours: BP 139/73   Pulse 61   Temp (!) 96.9 F (36.1 C) (Oral)   Resp 15   Ht 6' (1.829 m)   Wt 107.7 kg   SpO2 97%   BMI 32.21 kg/m  Constitutional:  Alert and oriented, No acute distress Cardiovascular: Regular rate  Respiratory: Normal respiratory effort GI: Abdomen is soft, nontender, nondistended, no abdominal masses. No CVAT.  Genitourinary: Normal male phallus, testes are descended bilaterally and non-tender and without masses, scrotum is normal in appearance without lesions or  masses, perineum is normal on inspection. Lymphatic: No lymphadenopathy Neurologic: Grossly intact, no focal deficits Psychiatric: Normal mood and affect  Laboratory Data:  Recent Labs    03/05/21 0926  WBC 11.8*  HGB 14.4  HCT 42.0  PLT 194    Recent Labs    03/05/21 0926  NA 138  K 4.3  CL 104  GLUCOSE 159*  BUN 21  CALCIUM 9.3  CREATININE 0.73     Results for orders placed or performed during the hospital encounter of 03/08/21 (from the past 24 hour(s))  Glucose, capillary     Status: Abnormal   Collection Time: 03/08/21  6:12 AM  Result Value Ref Range   Glucose-Capillary 153 (H) 70 - 99 mg/dL   Recent Results (from the past 240 hour(s))  SARS CORONAVIRUS 2 (TAT 6-24 HRS) Nasopharyngeal Nasopharyngeal Swab     Status: None  Collection Time: 03/05/21 10:21 AM   Specimen: Nasopharyngeal Swab  Result Value Ref Range Status   SARS Coronavirus 2 NEGATIVE NEGATIVE Final    Comment: (NOTE) SARS-CoV-2 target nucleic acids are NOT DETECTED.  The SARS-CoV-2 RNA is generally detectable in upper and lower respiratory specimens during the acute phase of infection. Negative results do not preclude SARS-CoV-2 infection, do not rule out co-infections with other pathogens, and should not be used as the sole basis for treatment or other patient management decisions. Negative results must be combined with clinical observations, patient history, and epidemiological information. The expected result is Negative.  Fact Sheet for Patients: SugarRoll.be  Fact Sheet for Healthcare Providers: https://www.woods-mathews.com/  This test is not yet approved or cleared by the [place] FDA and  has been authorized for detection and/or diagnosis of SARS-CoV-2 by FDA under an Emergency Use Authorization (EUA). This EUA will remain  in effect (meaning this test can be used) for the duration of the COVID-19 declaration under Se ction  564(b)(1) of the Act, 21 U.S.C. section 360bbb-3(b)(1), unless the authorization is terminated or revoked sooner.  Performed at [facility], [place] [address] [place],  [address]     Renal Function: Recent Labs    03/05/21 8466  CREATININE 0.73   Estimated Creatinine Clearance: 108.9 mL/min (by C-G formula based on SCr of 0.73 mg/dL).  Radiologic Imaging: No results found.  Impression/Assessment:  PCa  Plan:  I 125 brachytherapy, Space OAR

## 2021-03-08 NOTE — Anesthesia Preprocedure Evaluation (Signed)
Anesthesia Evaluation  Patient identified by MRN, date of birth, ID band Patient awake    Reviewed: Allergy & Precautions, NPO status , Patient's Chart, lab work & pertinent test results, reviewed documented beta blocker date and time   Airway Mallampati: II  TM Distance: >3 FB Neck ROM: Full    Dental no notable dental hx.    Pulmonary asthma , sleep apnea (non-compliant with CPAP) ,    Pulmonary exam normal breath sounds clear to auscultation       Cardiovascular hypertension, Pt. on medications and Pt. on home beta blockers + DVT  Normal cardiovascular exam+ dysrhythmias (eliquis) Atrial Fibrillation + Valvular Problems/Murmurs (mod-severe) AS  Rhythm:Regular Rate:Normal  Echo 01/2021: 1. Left ventricular ejection fraction, by estimation, is 60 to 65%. The  left ventricle has normal function. The left ventricle has no regional  wall motion abnormalities. There is moderate asymmetric left ventricular  hypertrophy of the basal-septal  segment. Left ventricular diastolic parameters are consistent with Grade  II diastolic dysfunction (pseudonormalization). Elevated left atrial  pressure.  2. Right ventricular systolic function is normal. The right ventricular  size is normal. Tricuspid regurgitation signal is inadequate for assessing  PA pressure.  3. Left atrial size was mildly dilated.  4. Right atrial size was mildly dilated.  5. The mitral valve is normal in structure. Trivial mitral valve  regurgitation.  6. Aortic dilatation noted. There is mild dilatation of the ascending  aorta, measuring 38 mm.  7. The inferior vena cava is normal in size with <50% respiratory  variability, suggesting right atrial pressure of 8 mmHg.  8. The aortic valve is calcified. There is severe calcifcation of the  aortic valve. Aortic valve regurgitation is trivial. Moderate to severe  aortic valve stenosis. Vmax 3.9 m/s, MG 37 mmHg, AVA  1.1 cm^2, DI 0.22     has some dizziness and shortness of breath when bending down. However denies any syncopal episodes.  Stress test 2018:  Nuclear stress EF: 53%.  Blood pressure demonstrated a normal response to exercise.  There was no ST segment deviation noted during stress.  No T wave inversion was noted during stress.  The left ventricular ejection fraction is mildly decreased (45-54%).  The study is normal.  This is a low risk study.     Neuro/Psych PSYCHIATRIC DISORDERS Depression negative neurological ROS     GI/Hepatic Neg liver ROS, hiatal hernia, GERD  Medicated and Controlled,  Endo/Other  diabetes, Well Controlled, Type 2, Oral Hypoglycemic AgentsObesity BMI 32 a1c 7.6  Renal/GU   negative genitourinary   Musculoskeletal  (+) Arthritis , Osteoarthritis,    Abdominal (+) + obese,   Peds  Hematology hct 42   Anesthesia Other Findings   Reproductive/Obstetrics negative OB ROS                            Anesthesia Physical Anesthesia Plan  ASA: IV  Anesthesia Plan: General   Post-op Pain Management:    Induction: Intravenous  PONV Risk Score and Plan: 2 and Ondansetron, Dexamethasone and Treatment may vary due to age or medical condition  Airway Management Planned: LMA  Additional Equipment: None  Intra-op Plan:   Post-operative Plan: Extubation in OR  Informed Consent: I have reviewed the patients History and Physical, chart, labs and discussed the procedure including the risks, benefits and alternatives for the proposed anesthesia with the patient or authorized representative who has indicated his/her understanding and acceptance.  Dental advisory given  Plan Discussed with: CRNA  Anesthesia Plan Comments:        Anesthesia Quick Evaluation

## 2021-03-09 ENCOUNTER — Encounter (HOSPITAL_BASED_OUTPATIENT_CLINIC_OR_DEPARTMENT_OTHER): Payer: Self-pay | Admitting: Urology

## 2021-03-22 ENCOUNTER — Telehealth: Payer: Self-pay | Admitting: Family Medicine

## 2021-03-22 NOTE — Telephone Encounter (Signed)
Called pt he needs diab ov and cpe/awv . Pt states he is coming in September and he is in [place].  He asked that I call him tomorrow.

## 2021-03-24 ENCOUNTER — Telehealth: Payer: Self-pay | Admitting: *Deleted

## 2021-03-24 NOTE — Telephone Encounter (Signed)
RETURNED PATIENT'S PHONE CALL, SPOKE WITH PATIENT. ?

## 2021-04-01 ENCOUNTER — Other Ambulatory Visit: Payer: PPO

## 2021-04-01 ENCOUNTER — Other Ambulatory Visit: Payer: Self-pay

## 2021-04-01 DIAGNOSIS — Z79899 Other long term (current) drug therapy: Secondary | ICD-10-CM

## 2021-04-01 LAB — HEPATIC FUNCTION PANEL
ALT: 25 IU/L (ref 0–44)
AST: 23 IU/L (ref 0–40)
Albumin: 4.3 g/dL (ref 3.8–4.8)
Alkaline Phosphatase: 83 IU/L (ref 44–121)
Bilirubin Total: 0.4 mg/dL (ref 0.0–1.2)
Bilirubin, Direct: 0.16 mg/dL (ref 0.00–0.40)
Total Protein: 6.9 g/dL (ref 6.0–8.5)

## 2021-04-01 LAB — LIPID PANEL
Chol/HDL Ratio: 3.2 ratio (ref 0.0–5.0)
Cholesterol, Total: 137 mg/dL (ref 100–199)
HDL: 43 mg/dL (ref >39)
LDL Chol Calc (NIH): 66 mg/dL (ref 0–99)
Triglycerides: 166 mg/dL — ABNORMAL HIGH (ref 0–149)
VLDL Cholesterol Cal: 28 mg/dL (ref 5–40)

## 2021-04-07 ENCOUNTER — Telehealth: Payer: Self-pay | Admitting: *Deleted

## 2021-04-07 NOTE — Telephone Encounter (Signed)
CALLED PATIENT TO REMIND OF POST SEED APPTS. FOR 04-08-21, LVM FOR A RETURN CALL

## 2021-04-07 NOTE — Progress Notes (Signed)
Radiation Oncology         [phone] ________________________________  Name: Jeremy Tucker MRN: [id number]  Date: 04/08/2021  DOB: 1951-07-18  Post-Seed Follow-Up Visit Note  CC: Denita Lung, MD  Franchot Gallo, MD  Diagnosis:   70 y.o. gentleman with Stage T1c adenocarcinoma of the prostate with Gleason score of 3+4, and PSA of 6.95.    ICD-10-CM   1. Malignant neoplasm of prostate ([place])  C61     Interval Since Last Radiation:  4.5 weeks 03/08/21:  Insertion of radioactive I-125 seeds into the prostate gland; 145 Gy, definitive therapy with placement of SpaceOAR Vue gel.  Narrative:  The patient returns today for routine follow-up.  He is complaining of increased urinary frequency and urinary hesitation symptoms. He filled out a questionnaire regarding urinary function today providing and overall IPSS score of 15 characterizing his symptoms as moderate with nocturia x4, increased urgency, hesitancy, intermittency, incomplete bladder emptying and mild dysuria.  He specifically denies gross hematuria, fever, chills or night sweats.  His pre-implant score was 6 on Flomax and finasteride daily.  He is continued taking the Flomax and finasteride daily and does feel that his LUTS are gradually improving.  He denies any abdominal pain or bowel symptoms.  He had some pretty significant fatigue in the first week after the procedure but this has greatly improved and no longer bothersome.  He saw Jiles Crocker, NP at [facility] on 03/29/2021 and is scheduled for a follow-up visit with Dr. Diona Fanti on 06/07/2021.  ALLERGIES:  is allergic to oxycodone.  Meds: Current Outpatient Medications  Medication Sig Dispense Refill  . acetaminophen (TYLENOL) 500 MG tablet Take 1,000 mg by mouth every 8 (eight) hours as needed for mild pain.     Marland Kitchen albuterol (VENTOLIN HFA) 108 (90 Base) MCG/ACT inhaler INHALE 2 PUFFS INTO THE LUNGS EVERY 6 HOURS AS NEEDED FOR WHEEZING OR SHORTNESS OF BREATH 54 g 0   . allopurinol (ZYLOPRIM) 300 MG tablet Take 1 tablet (300 mg total) by mouth daily. (Patient taking differently: Take 300 mg by mouth daily.) 90 tablet 3  . ammonium lactate (LAC-HYDRIN) 12 % lotion Apply 1 application topically daily as needed for dry skin (3-4 TIMES WEEK).     Marland Kitchen atorvastatin (LIPITOR) 40 MG tablet Take 1 tablet (40 mg total) by mouth daily. (Patient taking differently: Take 40 mg by mouth at bedtime.) 90 tablet 3  . buPROPion (WELLBUTRIN XL) 300 MG 24 hr tablet TAKE ONE TABLET BY MOUTH DAILY (Patient taking differently: Take 150 mg by mouth.) 90 tablet 0  . diltiazem (TIAZAC) 180 MG 24 hr capsule Take 1 capsule (180 mg total) by mouth daily. (Patient taking differently: Take 180 mg by mouth at bedtime.) 90 capsule 3  . ELIQUIS 5 MG TABS tablet TAKE ONE TABLET BY MOUTH TWICE A DAY 180 tablet 1  . finasteride (PROSCAR) 5 MG tablet Take 5 mg by mouth daily.    . fluticasone (FLONASE) 50 MCG/ACT nasal spray Place 2 sprays into both nostrils daily.    . Fluticasone-Salmeterol (ADVAIR DISKUS) 250-50 MCG/DOSE AEPB Inhale 1 puff into the lungs 2 (two) times daily. (Patient taking differently: Inhale 1 puff into the lungs 2 (two) times daily as needed.) 1 each 3  . loratadine (CLARITIN) 10 MG tablet Take 10 mg by mouth daily.    Marland Kitchen LORazepam (ATIVAN) 0.5 MG tablet TAKE 1 TABLET TWICE DAILY AS NEEDED FOR ANXIETY. 20 tablet 0  . metFORMIN (GLUCOPHAGE) 500 MG tablet  Take 1 tablet (500 mg total) by mouth 2 (two) times daily with a meal. 180 tablet 3  . metoprolol succinate (TOPROL-XL) 25 MG 24 hr tablet TAKE ONE TABLET BY MOUTH EVERY EVENING (Patient taking differently: Take 25 mg by mouth daily. TAKE ONE TABLET BY MOUTH EVERY EVENING) 90 tablet 0  . Multiple Vitamins-Minerals (MULTIVITAMIN WITH MINERALS) tablet Take 1 tablet by mouth daily.    Marland Kitchen olopatadine (PATANOL) 0.1 % ophthalmic solution Place 1 drop into both eyes daily as needed for allergies (tearing).     . Omega-3 Fatty Acids (FISH  OIL TRIPLE STRENGTH) 1400 MG CAPS Take 1,400 mg by mouth daily.     Marland Kitchen omeprazole (PRILOSEC) 20 MG capsule Take 20 mg by mouth daily.    . sertraline (ZOLOFT) 50 MG tablet TAKE ONE TABLET BY MOUTH DAILY 90 tablet 0  . sildenafil (VIAGRA) 100 MG tablet Take 100 mg by mouth daily as needed for erectile dysfunction.    . sulfamethoxazole-trimethoprim (BACTRIM DS) 800-160 MG tablet Take 1 tablet by mouth 2 (two) times daily. 6 tablet 0  . tamsulosin (FLOMAX) 0.4 MG CAPS capsule Take 0.4 mg by mouth daily.      No current facility-administered medications for this visit.    Physical Findings: In general this is a well appearing Caucasian male in no acute distress. He's alert and oriented x4 and appropriate throughout the examination. Cardiopulmonary assessment is negative for acute distress and he exhibits normal effort.   Lab Findings: Lab Results  Component Value Date   WBC 11.8 (H) 03/05/2021   HGB 14.4 03/05/2021   HCT 42.0 03/05/2021   MCV 95.5 03/05/2021   PLT 194 03/05/2021    Radiographic Findings:  Patient underwent CT imaging in our clinic for post implant dosimetry. The CT will be reviewed by Dr. Tammi Klippel to confirm there is an adequate distribution of radioactive seeds throughout the prostate gland and ensure that there are no seeds in or near the rectum. We suspect the final radiation plan and dosimetry will show appropriate coverage of the prostate gland. He understands that we will call and inform him of any unexpected findings on further review of his imaging and dosimetry.  Impression/Plan: 70 y.o. gentleman with Stage T1c adenocarcinoma of the prostate with Gleason score of 3+4, and PSA of 6.95. The patient is recovering from the effects of radiation. His urinary symptoms should gradually improve over the next 4-6 months. We talked about this today. He is encouraged by his improvement already and is otherwise pleased with his outcome. We also talked about long-term follow-up for  prostate cancer following seed implant. He understands that ongoing PSA determinations and digital rectal exams will help perform surveillance to rule out disease recurrence. He has a follow up appointment scheduled with Dr. Diona Fanti on 06/07/2021. He understands what to expect with his PSA measures. Patient was also educated today about some of the long-term effects from radiation including a small risk for rectal bleeding and possibly erectile dysfunction. We talked about some of the general management approaches to these potential complications. However, I did encourage the patient to contact our office or return at any point if he has questions or concerns related to his previous radiation and prostate cancer.    Nicholos Johns, PA-C

## 2021-04-07 NOTE — Progress Notes (Signed)
  Radiation Oncology         [phone] ________________________________  Name: Jeremy Tucker MRN: [id number]  Date: 04/08/2021  DOB: 1951/01/25  COMPLEX SIMULATION NOTE  NARRATIVE:  The patient was brought to the [facility] today following prostate seed implantation approximately one month ago.  Identity was confirmed.  All relevant records and images related to the planned course of therapy were reviewed.  Then, the patient was set-up supine.  CT images were obtained.  The CT images were loaded into the planning software.  Then the prostate and rectum were contoured.  Treatment planning then occurred.  The implanted iodine 125 seeds were identified by the physics staff for projection of radiation distribution  I have requested : 3D Simulation  I have requested a DVH of the following structures: Prostate and rectum.    ________________________________  Sheral Apley Tammi Klippel, M.D.

## 2021-04-08 ENCOUNTER — Ambulatory Visit
Admission: RE | Admit: 2021-04-08 | Discharge: 2021-04-08 | Disposition: A | Discharge status: Home / Self Care | Payer: PPO | Source: Ambulatory Visit | Attending: Radiation Oncology | Admitting: Radiation Oncology

## 2021-04-08 ENCOUNTER — Ambulatory Visit
Admission: RE | Admit: 2021-04-08 | Discharge: 2021-04-08 | Disposition: A | Discharge status: Home / Self Care | Payer: PPO | Source: Ambulatory Visit | Attending: Urology | Admitting: Urology

## 2021-04-08 ENCOUNTER — Other Ambulatory Visit: Payer: Self-pay

## 2021-04-08 ENCOUNTER — Encounter: Payer: Self-pay | Admitting: Urology

## 2021-04-08 VITALS — BP 131/74 | HR 79 | Temp 97.9°F | Resp 20 | Ht 72.0 in | Wt 239.0 lb

## 2021-04-08 DIAGNOSIS — Z7984 Long term (current) use of oral hypoglycemic drugs: Secondary | ICD-10-CM | POA: Insufficient documentation

## 2021-04-08 DIAGNOSIS — R300 Dysuria: Secondary | ICD-10-CM | POA: Insufficient documentation

## 2021-04-08 DIAGNOSIS — C61 Malignant neoplasm of prostate: Secondary | ICD-10-CM | POA: Insufficient documentation

## 2021-04-08 DIAGNOSIS — Z79899 Other long term (current) drug therapy: Secondary | ICD-10-CM | POA: Diagnosis not present

## 2021-04-08 DIAGNOSIS — R339 Retention of urine, unspecified: Secondary | ICD-10-CM | POA: Diagnosis not present

## 2021-04-08 DIAGNOSIS — R351 Nocturia: Secondary | ICD-10-CM | POA: Diagnosis not present

## 2021-04-08 DIAGNOSIS — Z7901 Long term (current) use of anticoagulants: Secondary | ICD-10-CM | POA: Diagnosis not present

## 2021-04-08 NOTE — Progress Notes (Signed)
Patient reports some dysuria . Denies any hematuria. Patient reports urgency. Denies any leakage.Patient reports nocturia 3-4. Patient states that he does not empty his bladder . Patient reports a moderate stream. Patient states that he has to push and strain to start his stream. Patient states that he stop and go during urination. Patient states that he seen th NP for the urologist a  week ago and has a follow up appointment in three months. Patient denies any issues with his bowels . States that he has some burning when he has a bowel movement.   Vitals:   04/08/21 0800  BP: 131/74  Pulse: 79  Resp: 20  Temp: 97.9 F (36.6 C)  SpO2: 97%  Weight: 108.4 kg  Height: 6' (1.829 m)

## 2021-04-18 ENCOUNTER — Other Ambulatory Visit: Payer: Self-pay | Admitting: Family Medicine

## 2021-04-18 DIAGNOSIS — I350 Nonrheumatic aortic (valve) stenosis: Secondary | ICD-10-CM

## 2021-04-19 NOTE — Telephone Encounter (Signed)
[facility] is requesting to fill pt wellbutrin. Pt has been sent a my chart message to advise of the need for an appointment. I will also send another message. [place]

## 2021-04-19 NOTE — Telephone Encounter (Signed)
Its time for a visit 

## 2021-04-23 ENCOUNTER — Ambulatory Visit
Admission: RE | Admit: 2021-04-23 | Discharge: 2021-04-23 | Disposition: A | Discharge status: Home / Self Care | Payer: PPO | Source: Ambulatory Visit | Attending: Radiation Oncology | Admitting: Radiation Oncology

## 2021-04-23 ENCOUNTER — Encounter: Payer: Self-pay | Admitting: Radiation Oncology

## 2021-04-23 DIAGNOSIS — Z510 Encounter for antineoplastic radiation therapy: Secondary | ICD-10-CM | POA: Insufficient documentation

## 2021-04-23 DIAGNOSIS — C61 Malignant neoplasm of prostate: Secondary | ICD-10-CM | POA: Diagnosis not present

## 2021-04-27 DIAGNOSIS — J342 Deviated nasal septum: Secondary | ICD-10-CM | POA: Diagnosis not present

## 2021-04-27 DIAGNOSIS — H6121 Impacted cerumen, right ear: Secondary | ICD-10-CM | POA: Diagnosis not present

## 2021-04-27 DIAGNOSIS — J330 Polyp of nasal cavity: Secondary | ICD-10-CM | POA: Diagnosis not present

## 2021-04-30 ENCOUNTER — Telehealth: Payer: Self-pay | Admitting: Interventional Cardiology

## 2021-04-30 NOTE — Telephone Encounter (Signed)
pt is wanting to know if he is able to take eliquis by cutting it in half... currently positive for covid-19.

## 2021-04-30 NOTE — Telephone Encounter (Signed)
Pt states wife has tested positive for covid but so far he is testing negative.  He does have cough and congestion.  States MD called in Plaxlovid for both of them.  He was calling to inquire about his Eliquis dose while on Plaxlovid.  Spoke with [place], [facility].  She states pt needs to half his Eliquis and not take Flomax, Advair or Viagra while on Plaxlovid.  Made pt aware of information.  Pt appreciative for call.

## 2021-05-12 NOTE — Progress Notes (Signed)
  Radiation Oncology         [phone] ________________________________  Name: Jeremy Tucker MRN: [id number]  Date: 04/23/2021  DOB: 10-17-1951  3D Planning Note   Prostate Brachytherapy Post-Implant Dosimetry  Diagnosis: 70 y.o. gentleman with Stage T1c adenocarcinoma of the prostate with Gleason score of 3+4, and PSA of 6.95.  Narrative: On a previous date, Jeremy Tucker returned following prostate seed implantation for post implant planning. He underwent CT scan complex simulation to delineate the three-dimensional structures of the pelvis and demonstrate the radiation distribution.  Since that time, the seed localization, and complex isodose planning with dose volume histograms have now been completed.  Results:   Prostate Coverage - The dose of radiation delivered to the 90% or more of the prostate gland (D90) was 105.57% of the prescription dose. This exceeds our goal of greater than 90%. Rectal Sparing - The volume of rectal tissue receiving the prescription dose or higher was 0.0 cc. This falls under our thresholds tolerance of 1.0 cc.  Impression: The prostate seed implant appears to show adequate target coverage and appropriate rectal sparing.  Plan:  The patient will continue to follow with urology for ongoing PSA determinations. I would anticipate a high likelihood for local tumor control with minimal risk for rectal morbidity.  ________________________________  Sheral Apley Tammi Klippel, M.D.

## 2021-06-02 DIAGNOSIS — C61 Malignant neoplasm of prostate: Secondary | ICD-10-CM | POA: Diagnosis not present

## 2021-06-07 DIAGNOSIS — N5201 Erectile dysfunction due to arterial insufficiency: Secondary | ICD-10-CM | POA: Diagnosis not present

## 2021-06-07 DIAGNOSIS — R3915 Urgency of urination: Secondary | ICD-10-CM | POA: Diagnosis not present

## 2021-06-07 DIAGNOSIS — N401 Enlarged prostate with lower urinary tract symptoms: Secondary | ICD-10-CM | POA: Diagnosis not present

## 2021-06-07 DIAGNOSIS — R351 Nocturia: Secondary | ICD-10-CM | POA: Diagnosis not present

## 2021-06-07 DIAGNOSIS — C61 Malignant neoplasm of prostate: Secondary | ICD-10-CM | POA: Diagnosis not present

## 2021-06-07 DIAGNOSIS — R350 Frequency of micturition: Secondary | ICD-10-CM | POA: Diagnosis not present

## 2021-06-15 DIAGNOSIS — J3489 Other specified disorders of nose and nasal sinuses: Secondary | ICD-10-CM | POA: Diagnosis not present

## 2021-06-15 DIAGNOSIS — J342 Deviated nasal septum: Secondary | ICD-10-CM | POA: Diagnosis not present

## 2021-06-15 DIAGNOSIS — J339 Nasal polyp, unspecified: Secondary | ICD-10-CM | POA: Diagnosis not present

## 2021-06-24 ENCOUNTER — Other Ambulatory Visit: Payer: Self-pay | Admitting: Family Medicine

## 2021-06-24 ENCOUNTER — Telehealth: Payer: Self-pay

## 2021-06-24 DIAGNOSIS — F32A Depression, unspecified: Secondary | ICD-10-CM

## 2021-06-24 DIAGNOSIS — F419 Anxiety disorder, unspecified: Secondary | ICD-10-CM

## 2021-06-24 NOTE — Telephone Encounter (Signed)
Per Dr. Redmond School pt needs appt. Pt advised he ill call in the am to schedule . [place]

## 2021-06-24 NOTE — Telephone Encounter (Signed)
Is this okay to refill? 

## 2021-06-25 NOTE — Progress Notes (Addendum)
Surgical Instructions    Your procedure is scheduled on Monday, August 22nd, 2022.   Report to [facility] Main Entrance "A" at 05:30 A.M., then check in with the Admitting office.  Call this number if you have problems the morning of surgery:  [phone]   If you have any questions prior to your surgery date call [phone]: Open Monday-Friday 8am-4pm    Remember:  Do not eat after midnight the night before your surgery  You may drink clear liquids until 04:30 the morning of your surgery.   Clear liquids allowed are: Water, Non-Citrus Juices (without pulp), Carbonated Beverages, Clear Tea, Black Coffee Only, and Gatorade   Take these medicines the morning of surgery with A SIP OF WATER:  allopurinol (ZYLOPRIM) buPROPion (WELLBUTRIN XL)  diltiazem (TIAZAC)  finasteride (PROSCAR) loratadine (CLARITIN)  omeprazole (PRILOSEC) sertraline (ZOLOFT) sulfamethoxazole-trimethoprim (BACTRIM DS)  If needed:  acetaminophen (TYLENOL) albuterol (VENTOLIN HFA) - please, bring the inhaler with you the day of surgery fluticasone (FLONASE) Fluticasone-Salmeterol (ADVAIR DISKUS) - please, bring the inhaler with you the day of surgery LORazepam (ATIVAN) olopatadine (PATANOL)   Follow your surgeon's instructions on when to stop Eliquis.  If no instructions were given by your surgeon then you will need to call the office to get those instructions.     As of today, STOP taking any Aspirin (unless otherwise instructed by your surgeon) Aleve, Naproxen, Ibuprofen, Motrin, Advil, Goody's, BC's, all herbal medications, fish oil, and all vitamins.  WHAT DO I DO ABOUT MY DIABETES MEDICATION?   Do not take metFORMIN (GLUCOPHAGE) the morning of surgery.  HOW TO MANAGE YOUR DIABETES BEFORE AND AFTER SURGERY  Why is it important to control my blood sugar before and after surgery? Improving blood sugar levels before and after surgery helps healing and can limit problems. A way of improving blood  sugar control is eating a healthy diet by:  Eating less sugar and carbohydrates  Increasing activity/exercise  Talking with your doctor about reaching your blood sugar goals High blood sugars (greater than 180 mg/dL) can raise your risk of infections and slow your recovery, so you will need to focus on controlling your diabetes during the weeks before surgery. Make sure that the doctor who takes care of your diabetes knows about your planned surgery including the date and location.  How do I manage my blood sugar before surgery? Check your blood sugar at least 4 times a day, starting 2 days before surgery, to make sure that the level is not too high or low.  Check your blood sugar the morning of your surgery when you wake up and every 2 hours until you get to the Short Stay unit.  If your blood sugar is less than 70 mg/dL, you will need to treat for low blood sugar: Do not take insulin. Treat a low blood sugar (less than 70 mg/dL) with  cup of clear juice (cranberry or apple), 4 glucose tablets, OR glucose gel. Recheck blood sugar in 15 minutes after treatment (to make sure it is greater than 70 mg/dL). If your blood sugar is not greater than 70 mg/dL on recheck, call [phone] for further instructions. Report your blood sugar to the short stay nurse when you get to Short Stay.  If you are admitted to the hospital after surgery: Your blood sugar will be checked by the staff and you will probably be given insulin after surgery (instead of oral diabetes medicines) to make sure you have good blood sugar levels. The goal  for blood sugar control after surgery is 80-180 mg/dL.           Do not wear jewelry  Do not wear lotions, powders, colognes, or deodorant. Men may shave face and neck. Do not bring valuables to the hospital. DO Not wear nail polish, gel polish, artificial nails, or any other type of covering on natural nails including finger and toenails. If patients have artificial  nails, gel coating, etc. that need to be removed by a nail salon please have this removed prior to surgery or surgery may need to be canceled/delayed if the surgeon/ anesthesia feels like the patient is unable to be adequately monitored.             [facility] is not responsible for any belongings or valuables.  Do NOT Smoke (Tobacco/Vaping) or drink Alcohol 24 hours prior to your procedure If you use a CPAP at night, you may bring all equipment for your overnight stay.   Contacts, glasses, dentures or bridgework may not be worn into surgery, please bring cases for these belongings   For patients admitted to the hospital, discharge time will be determined by your treatment team.   Patients discharged the day of surgery will not be allowed to drive home, and someone needs to stay with them for 24 hours.  ONLY 1 SUPPORT PERSON MAY BE PRESENT WHILE YOU ARE IN SURGERY. IF YOU ARE TO BE ADMITTED ONCE YOU ARE IN YOUR ROOM YOU WILL BE ALLOWED TWO (2) VISITORS.  Minor children may have two parents present. Special consideration for safety and communication needs will be reviewed on a case by case basis.  Special instructions:    Oral Hygiene is also important to reduce your risk of infection.  Remember - BRUSH YOUR TEETH THE MORNING OF SURGERY WITH YOUR REGULAR TOOTHPASTE   [facility]- Preparing For Surgery  Before surgery, you can play an important role. Because skin is not sterile, your skin needs to be as free of germs as possible. You can reduce the number of germs on your skin by washing with CHG (chlorahexidine gluconate) Soap before surgery.  CHG is an antiseptic cleaner which kills germs and bonds with the skin to continue killing germs even after washing.     Please do not use if you have an allergy to CHG or antibacterial soaps. If your skin becomes reddened/irritated stop using the CHG.  Do not shave (including legs and underarms) for at least 48 hours prior to first CHG shower. It is  OK to shave your face.  Please follow these instructions carefully.     Shower the NIGHT BEFORE SURGERY and the MORNING OF SURGERY with CHG Soap.   If you chose to wash your hair, wash your hair first as usual with your normal shampoo. After you shampoo, rinse your hair and body thoroughly to remove the shampoo.  Then [employer] and genitals (private parts) with your normal soap and rinse thoroughly to remove soap.  After that Use CHG Soap as you would any other liquid soap. You can apply CHG directly to the skin and wash gently with a scrungie or a clean washcloth.   Apply the CHG Soap to your body ONLY FROM THE NECK DOWN.  Do not use on open wounds or open sores. Avoid contact with your eyes, ears, mouth and genitals (private parts). Wash Face and genitals (private parts)  with your normal soap.   Wash thoroughly, paying special attention to the area where your  surgery will be performed.  Thoroughly rinse your body with warm water from the neck down.  DO NOT shower/wash with your normal soap after using and rinsing off the CHG Soap.  Pat yourself dry with a CLEAN TOWEL.  Wear CLEAN PAJAMAS to bed the night before surgery  Place CLEAN SHEETS on your bed the night before your surgery  DO NOT SLEEP WITH PETS.   Day of Surgery:  Take a shower with CHG soap. Wear Clean/Comfortable clothing the morning of surgery Do not apply any deodorants/lotions.   Remember to brush your teeth WITH YOUR REGULAR TOOTHPASTE.   Please read over the following fact sheets that you were given.

## 2021-06-27 ENCOUNTER — Ambulatory Visit: Payer: Self-pay | Admitting: General Surgery

## 2021-06-28 ENCOUNTER — Encounter (HOSPITAL_COMMUNITY): Payer: Self-pay

## 2021-06-28 ENCOUNTER — Encounter (HOSPITAL_COMMUNITY)
Admission: RE | Admit: 2021-06-28 | Discharge: 2021-06-28 | Disposition: A | Discharge status: Home / Self Care | Payer: PPO | Source: Ambulatory Visit | Attending: General Surgery | Admitting: General Surgery

## 2021-06-28 ENCOUNTER — Other Ambulatory Visit: Payer: Self-pay

## 2021-06-28 DIAGNOSIS — G4733 Obstructive sleep apnea (adult) (pediatric): Secondary | ICD-10-CM | POA: Insufficient documentation

## 2021-06-28 DIAGNOSIS — J339 Nasal polyp, unspecified: Secondary | ICD-10-CM | POA: Diagnosis not present

## 2021-06-28 DIAGNOSIS — Z01812 Encounter for preprocedural laboratory examination: Secondary | ICD-10-CM | POA: Diagnosis not present

## 2021-06-28 DIAGNOSIS — Z86718 Personal history of other venous thrombosis and embolism: Secondary | ICD-10-CM | POA: Diagnosis not present

## 2021-06-28 DIAGNOSIS — J329 Chronic sinusitis, unspecified: Secondary | ICD-10-CM | POA: Diagnosis not present

## 2021-06-28 DIAGNOSIS — J3489 Other specified disorders of nose and nasal sinuses: Secondary | ICD-10-CM | POA: Diagnosis not present

## 2021-06-28 DIAGNOSIS — J343 Hypertrophy of nasal turbinates: Secondary | ICD-10-CM | POA: Diagnosis not present

## 2021-06-28 DIAGNOSIS — J330 Polyp of nasal cavity: Secondary | ICD-10-CM | POA: Diagnosis not present

## 2021-06-28 HISTORY — DX: Anxiety disorder, unspecified: F41.9

## 2021-06-28 LAB — GLUCOSE, CAPILLARY: Glucose-Capillary: 187 mg/dL — ABNORMAL HIGH (ref 70–99)

## 2021-06-28 LAB — HEMOGLOBIN A1C
Hgb A1c MFr Bld: 6.9 % — ABNORMAL HIGH (ref 4.8–5.6)
Mean Plasma Glucose: 151.33 mg/dL

## 2021-06-28 LAB — BASIC METABOLIC PANEL
Anion gap: 10 (ref 5–15)
BUN: 13 mg/dL (ref 8–23)
CO2: 25 mmol/L (ref 22–32)
Calcium: 9.4 mg/dL (ref 8.9–10.3)
Chloride: 101 mmol/L (ref 98–111)
Creatinine, Ser: 0.74 mg/dL (ref 0.61–1.24)
GFR, Estimated: >60 mL/min (ref >60)
Glucose, Bld: 179 mg/dL — ABNORMAL HIGH (ref 70–99)
Potassium: 3.9 mmol/L (ref 3.5–5.1)
Sodium: 136 mmol/L (ref 135–145)

## 2021-06-28 LAB — CBC
HCT: 42 % (ref 39.0–52.0)
Hemoglobin: 14.3 g/dL (ref 13.0–17.0)
MCH: 32 pg (ref 26.0–34.0)
MCHC: 34 g/dL (ref 30.0–36.0)
MCV: 94 fL (ref 80.0–100.0)
Platelets: 246 10*3/uL (ref 150–400)
RBC: 4.47 MIL/uL (ref 4.22–5.81)
RDW: 12.7 % (ref 11.5–15.5)
WBC: 9.6 10*3/uL (ref 4.0–10.5)
nRBC: 0 % (ref 0.0–0.2)

## 2021-06-28 NOTE — Progress Notes (Signed)
PCP - Jill Alexanders, MD Cardiologist - Daneen Schick, MD  PPM/ICD - denies Device Orders - N/A Rep Notified - N/A  Chest x-ray - 02/12/2021 EKG - 02/15/2021 Stress Test - 04/26/2017 ECHO - 01/13/2021 Cardiac Cath - 09/19/2018  Sleep Study - yes CPAP - patient is not wearing  Fasting Blood Sugar - patient is not checking CBG CBG today - 187 A1C - done in PAT - 06/28/2021  Blood Thinner Instructions: Eliquis - last dose - 07/01/2021 per patient  Aspirin Instructions: Patient was instructed: As of today, STOP taking any Aspirin (unless otherwise instructed by your surgeon) Aleve, Naproxen, Ibuprofen, Motrin, Advil, Goody's, BC's, all herbal medications, fish oil, and all vitamins.  ERAS Protcol - No  COVID TEST- no - ambulatory surgery   Anesthesia review: yes;cardiac history  Patient denies shortness of breath, fever, cough and chest pain at PAT appointment   All instructions explained to the patient, with a verbal understanding of the material. Patient agrees to go over the instructions while at home for a better understanding. Patient also instructed to self quarantine after being tested for COVID-19. The opportunity to ask questions was provided.

## 2021-06-29 NOTE — Anesthesia Preprocedure Evaluation (Addendum)
Anesthesia Evaluation  Patient identified by MRN, date of birth, ID band Patient awake    Reviewed: Allergy & Precautions, NPO status , Patient's Chart, lab work & pertinent test results  Airway Mallampati: II       Dental no notable dental hx.    Pulmonary asthma ,    Pulmonary exam normal        Cardiovascular hypertension, Pt. on medications Normal cardiovascular exam     Neuro/Psych    GI/Hepatic GERD  Medicated and Controlled,  Endo/Other  diabetes, Oral Hypoglycemic Agents  Renal/GU      Musculoskeletal   Abdominal (+) + obese,   Peds  Hematology negative hematology ROS (+)   Anesthesia Other Findings Follows with cardiology for history of moderate to severe aortic stenosis, paroxysmal atrial fib (remote ablation with recurrence), HTN, HLD, OSA (did not tolerate CPAP), remote DVT after knee surgery. He has a prior history of chest pain with cath in 2019 showing minimal coronary atherosclerosis.  Most recent echo performed 01/13/21 showing EF 60-65%, moderate ABSH, grade 2 DD, normal RV, mild BAE, mild dilation of ascending aorta, severe calcification of the AV, with moderate-severe aortic stenosis.  He was last seen by cardiology 02/15/2021 for preop clearance prior to undergoing prostate surgery.  Per note, "Pre-op cardiovascular examination, most notably with aortic stenosis- RCRI 0.4% indicating low risk of CV complications but this does not necessarily factor in his aortic stenosis. However, he is able to perform over 4 METS without precipitating any angina, dyspnea, pre-syncope or syncope. He has not had any recent decline in his exercise capacity. I discussed his clinical case and echo with Dr. Tamala Julian. Given clinical stability,Jeremy S Winepolwould be at acceptable risk for the planned procedure without further cardiovascular testing. Per pharmD review, per office protocol, patient can holdEliquissfor 2days prior to  procedure. Will route this note to requesting surgeon after signing. He was encouraged to reach out if he develops any new symptoms at any time.Per d/w MD, wewill arrange a 6 month follow-up with Dr. Tamala Julian at which time it will be decided if he should repeat echo then or wait until the 1 year mark."  Patient had also previously been cleared to undergo hernia surgery per telephone encounter 12/04/2020, however it does not appear he underwent surgery at that time.  Patient reported last dose Eliquis 07/01/2021.  DM2, A1c 6.9 on preop labs.  Remainder of preop labs unremarkable.  EKG 02/15/2021: NSR.  Rate 60.  TTE 01/13/2021: 1. Left ventricular ejection fraction, by estimation, is 60 to 65%. The  left ventricle has normal function. The left ventricle has no regional  wall motion abnormalities. There is moderate asymmetric left ventricular  hypertrophy of the basal-septal  segment. Left ventricular diastolic parameters are consistent with Grade  II diastolic dysfunction (pseudonormalization). Elevated left atrial  pressure.  2. Right ventricular systolic function is normal. The right ventricular  size is normal. Tricuspid regurgitation signal is inadequate for assessing  PA pressure.  3. Left atrial size was mildly dilated.  4. Right atrial size was mildly dilated.  5. The mitral valve is normal in structure. Trivial mitral valve  regurgitation.  6. Aortic dilatation noted. There is mild dilatation of the ascending  aorta, measuring 38 mm.  7. The inferior vena cava is normal in size with <50% respiratory  variability, suggesting right atrial pressure of 8 mmHg.  8. The aortic valve is calcified. There is severe calcifcation of the  aortic valve. Aortic valve regurgitation is trivial.  Moderate to severe  aortic valve stenosis. Vmax 3.9 m/s, MG 37 mmHg, AVA 1.1 cm^2, DI 0.22   Cath 09/19/2018: ? Right dominant coronary anatomy. ? Normal left main. ? Normal LAD. ? Normal  circumflex giving origin to 4 obtuse marginals with the second marginal being a large vessel supplying much of the lateral wall. ? RCA contains eccentric proximal 30% narrowing. Otherwise widely patent without any significant obstructive disease. ? Heavily calcified aortic valve with restricted motion. Transvalvular peak to peak gradient 33 mmHg; mean gradient 30 mmHg. ? Normal left ventricular LVEDP, 15 mmHg. ? Atypical episodes of chest pain occurring at rest lasting up to 30 to 40 minutes, characterized as severe and in the left pectoral region. Suspect neurogenic/musculoskeletal. Rule out aortic related.  RECOMMENDATIONS:  ? CT aortic angiography to rule out aneurysm/dissection. We will set up to be done as an outpatient. ? Continue aerobic activity. ? Resume apixaban a.m. 09/20/2018.   Wynonia Musty [facility] Short Stay Center/Anesthesiology Phone [phone] 06/29/2021 4:09 PM   Electronically signed    Reproductive/Obstetrics                                                          Anesthesia Evaluation  Patient identified by MRN, date of birth, ID band Patient awake    Reviewed: Allergy & Precautions, NPO status , Patient's Chart, lab work & pertinent test results, reviewed documented beta blocker date and time   Airway Mallampati: II  TM Distance: >3 FB Neck ROM: Full    Dental no notable dental hx.    Pulmonary asthma , sleep apnea (non-compliant with CPAP) ,    Pulmonary exam normal breath sounds clear to auscultation       Cardiovascular hypertension, Pt. on medications and Pt. on home beta blockers + DVT  Normal cardiovascular exam+ dysrhythmias (eliquis) Atrial Fibrillation + Valvular Problems/Murmurs (mod-severe) AS  Rhythm:Regular Rate:Normal  Echo 01/2021: 1. Left ventricular ejection fraction, by estimation, is 60 to 65%. The  left ventricle has normal function. The left ventricle has no regional  wall motion  abnormalities. There is moderate asymmetric left ventricular  hypertrophy of the basal-septal  segment. Left ventricular diastolic parameters are consistent with Grade  II diastolic dysfunction (pseudonormalization). Elevated left atrial  pressure.  2. Right ventricular systolic function is normal. The right ventricular  size is normal. Tricuspid regurgitation signal is inadequate for assessing  PA pressure.  3. Left atrial size was mildly dilated.  4. Right atrial size was mildly dilated.  5. The mitral valve is normal in structure. Trivial mitral valve  regurgitation.  6. Aortic dilatation noted. There is mild dilatation of the ascending  aorta, measuring 38 mm.  7. The inferior vena cava is normal in size with <50% respiratory  variability, suggesting right atrial pressure of 8 mmHg.  8. The aortic valve is calcified. There is severe calcifcation of the  aortic valve. Aortic valve regurgitation is trivial. Moderate to severe  aortic valve stenosis. Vmax 3.9 m/s, MG 37 mmHg, AVA 1.1 cm^2, DI 0.22     has some dizziness and shortness of breath when bending down. However denies any syncopal episodes.  Stress test 2018:  Nuclear stress EF: 53%.  Blood pressure demonstrated a normal response to exercise.  There was no ST segment deviation noted during  stress.  No T wave inversion was noted during stress.  The left ventricular ejection fraction is mildly decreased (45-54%).  The study is normal.  This is a low risk study.     Neuro/Psych PSYCHIATRIC DISORDERS Depression negative neurological ROS     GI/Hepatic Neg liver ROS, hiatal hernia, GERD  Medicated and Controlled,  Endo/Other  diabetes, Well Controlled, Type 2, Oral Hypoglycemic AgentsObesity BMI 32 a1c 7.6  Renal/GU   negative genitourinary   Musculoskeletal  (+) Arthritis , Osteoarthritis,    Abdominal (+) + obese,   Peds  Hematology hct 42   Anesthesia Other Findings    Reproductive/Obstetrics negative OB ROS                            Anesthesia Physical Anesthesia Plan  ASA: IV  Anesthesia Plan: General   Post-op Pain Management:    Induction: Intravenous  PONV Risk Score and Plan: 2 and Ondansetron, Dexamethasone and Treatment may vary due to age or medical condition  Airway Management Planned: LMA  Additional Equipment: None  Intra-op Plan:   Post-operative Plan: Extubation in OR  Informed Consent: I have reviewed the patients History and Physical, chart, labs and discussed the procedure including the risks, benefits and alternatives for the proposed anesthesia with the patient or authorized representative who has indicated his/her understanding and acceptance.     Dental advisory given  Plan Discussed with: CRNA  Anesthesia Plan Comments:        Anesthesia Quick Evaluation  Anesthesia Physical Anesthesia Plan  ASA: 3  Anesthesia Plan: General   Post-op Pain Management:    Induction: Intravenous  PONV Risk Score and Plan: Ondansetron  Airway Management Planned: Oral ETT  Additional Equipment: None  Intra-op Plan:   Post-operative Plan: Extubation in OR  Informed Consent: I have reviewed the patients History and Physical, chart, labs and discussed the procedure including the risks, benefits and alternatives for the proposed anesthesia with the patient or authorized representative who has indicated his/her understanding and acceptance.     Dental advisory given  Plan Discussed with: CRNA  Anesthesia Plan Comments: (PAT note by Karoline Caldwell, PA-C: Follows with cardiology for history of moderate to severe aortic stenosis, paroxysmal atrial fib (remote ablation with recurrence), HTN, HLD, OSA (did not tolerate CPAP), remote DVT after knee surgery. He has a prior history of chest pain with cath in 2019 showing minimal coronary atherosclerosis.  Most recent echo performed 01/13/21 showing EF  60-65%, moderate ABSH, grade 2 DD, normal RV, mild BAE, mild dilation of ascending aorta, severe calcification of the AV, with moderate-severe aortic stenosis.  He was last seen by cardiology 02/15/2021 for preop clearance prior to undergoing prostate surgery.  Per note, "Pre-op cardiovascular examination, most notably with aortic stenosis- RCRI 0.4% indicating low risk of CV complications but this does not necessarily factor in his aortic stenosis. However, he is able to perform over 4 METS without precipitating any angina, dyspnea, pre-syncope or syncope. He has not had any recent decline in his exercise capacity. I discussed his clinical case and echo with Dr. Tamala Julian. Given clinical stability,Jeremy S Winepolwould be at acceptable risk for the planned procedure without further cardiovascular testing. Per pharmD review, per office protocol, patient can holdEliquissfor 2days prior to procedure. Will route this note to requesting surgeon after signing. He was encouraged to reach out if he develops any new symptoms at any time.Per d/w MD, wewill arrange a 6 month  follow-up with Dr. Tamala Julian at which time it will be decided if he should repeat echo then or wait until the 1 year mark."  Patient had also previously been cleared to undergo hernia surgery per telephone encounter 12/04/2020, however it does not appear he underwent surgery at that time.  Patient reported last dose Eliquis 07/01/2021.  DM2, A1c 6.9 on preop labs.  Remainder of preop labs unremarkable.  EKG 02/15/2021: NSR.  Rate 60.  TTE 01/13/2021: 1. Left ventricular ejection fraction, by estimation, is 60 to 65%. The  left ventricle has normal function. The left ventricle has no regional  wall motion abnormalities. There is moderate asymmetric left ventricular  hypertrophy of the basal-septal  segment. Left ventricular diastolic parameters are consistent with Grade  II diastolic dysfunction (pseudonormalization). Elevated left atrial  pressure.   2. Right ventricular systolic function is normal. The right ventricular  size is normal. Tricuspid regurgitation signal is inadequate for assessing  PA pressure.  3. Left atrial size was mildly dilated.  4. Right atrial size was mildly dilated.  5. The mitral valve is normal in structure. Trivial mitral valve  regurgitation.  6. Aortic dilatation noted. There is mild dilatation of the ascending  aorta, measuring 38 mm.  7. The inferior vena cava is normal in size with <50% respiratory  variability, suggesting right atrial pressure of 8 mmHg.  8. The aortic valve is calcified. There is severe calcifcation of the  aortic valve. Aortic valve regurgitation is trivial. Moderate to severe  aortic valve stenosis. Vmax 3.9 m/s, MG 37 mmHg, AVA 1.1 cm^2, DI 0.22   Cath 09/19/2018: . Right dominant coronary anatomy. . Normal left main. . Normal LAD. Marland Kitchen Normal circumflex giving origin to 4 obtuse marginals with the second marginal being a large vessel supplying much of the lateral wall. Marland Kitchen RCA contains eccentric proximal 30% narrowing. Otherwise widely patent without any significant obstructive disease. Marland Kitchen Heavily calcified aortic valve with restricted motion. Transvalvular peak to peak gradient 33 mmHg; mean gradient 30 mmHg. Marland Kitchen Normal left ventricular LVEDP, 15 mmHg. Marland Kitchen Atypical episodes of chest pain occurring at rest lasting up to 30 to 40 minutes, characterized as severe and in the left pectoral region. Suspect neurogenic/musculoskeletal. Rule out aortic related.  RECOMMENDATIONS:  . CT aortic angiography to rule out aneurysm/dissection. We will set up to be done as an outpatient. . Continue aerobic activity. . Resume apixaban a.m. 09/20/2018. He will use albuterol prior to OR and will get neosynephrine at induction as well as Etomidate/Ketamine  )     Anesthesia Quick Evaluation

## 2021-06-29 NOTE — Progress Notes (Addendum)
Anesthesia Chart Review:  Follows with cardiology for history of moderate to severe aortic stenosis, paroxysmal atrial fib (remote ablation with recurrence), HTN, HLD, OSA (did not tolerate CPAP), remote DVT after knee surgery. He has a prior history of chest pain with cath in 2019 showing minimal coronary atherosclerosis.  Most recent echo performed 01/13/21 showing EF 60-65%, moderate ABSH, grade 2 DD, normal RV, mild BAE, mild dilation of ascending aorta, severe calcification of the AV, with moderate-severe aortic stenosis.  He was last seen by cardiology 02/15/2021 for preop clearance prior to undergoing prostate surgery.  Per note, " Pre-op cardiovascular examination, most notably with aortic stenosis - RCRI 0.4% indicating low risk of CV complications but this does not necessarily factor in his aortic stenosis. However, he is able to perform over 4 METS without precipitating any angina, dyspnea, pre-syncope or syncope. He has not had any recent decline in his exercise capacity. I discussed his clinical case and echo with Dr. Tamala Julian. Given clinical stability, RUSTEN Jeremy Tucker would be at acceptable risk for the planned procedure without further cardiovascular testing. Per pharmD review, per office protocol, patient can hold Eliquiss for 2 days prior to procedure. Will route this note to requesting surgeon after signing. He was encouraged to reach out if he develops any new symptoms at any time. Per d/w MD, we will arrange a 6 month follow-up with Dr. Tamala Julian at which time it will be decided if he should repeat echo then or wait until the 1 year mark."  Patient had also previously been cleared to undergo hernia surgery per telephone encounter 12/04/2020, however it does not appear he underwent surgery at that time.  Patient reported last dose Eliquis 07/01/2021.  DM2, A1c 6.9 on preop labs.  Remainder of preop labs unremarkable.  EKG 02/15/2021: NSR.  Rate 60.  TTE 01/13/2021:  1. Left ventricular ejection fraction,  by estimation, is 60 to 65%. The  left ventricle has normal function. The left ventricle has no regional  wall motion abnormalities. There is moderate asymmetric left ventricular  hypertrophy of the basal-septal  segment. Left ventricular diastolic parameters are consistent with Grade  II diastolic dysfunction (pseudonormalization). Elevated left atrial  pressure.   2. Right ventricular systolic function is normal. The right ventricular  size is normal. Tricuspid regurgitation signal is inadequate for assessing  PA pressure.   3. Left atrial size was mildly dilated.   4. Right atrial size was mildly dilated.   5. The mitral valve is normal in structure. Trivial mitral valve  regurgitation.   6. Aortic dilatation noted. There is mild dilatation of the ascending  aorta, measuring 38 mm.   7. The inferior vena cava is normal in size with <50% respiratory  variability, suggesting right atrial pressure of 8 mmHg.   8. The aortic valve is calcified. There is severe calcifcation of the  aortic valve. Aortic valve regurgitation is trivial. Moderate to severe  aortic valve stenosis. Vmax 3.9 m/s, MG 37 mmHg, AVA 1.1 cm^2, DI 0.22   Cath 09/19/2018: Right dominant coronary anatomy. Normal left main. Normal LAD. Normal circumflex giving origin to 4 obtuse marginals with the second marginal being a large vessel supplying much of the lateral wall. RCA contains eccentric proximal 30% narrowing.  Otherwise widely patent without any significant obstructive disease. Heavily calcified aortic valve with restricted motion.  Transvalvular peak to peak gradient 33 mmHg; mean gradient 30 mmHg. Normal left ventricular LVEDP, 15 mmHg. Atypical episodes of chest pain occurring at rest lasting  up to 30 to 40 minutes, characterized as severe and in the left pectoral region.  Suspect neurogenic/musculoskeletal.  Rule out aortic related.   RECOMMENDATIONS:   CT aortic angiography to rule out aneurysm/dissection.   We will set up to be done as an outpatient. Continue aerobic activity. Resume apixaban a.m. 09/20/2018.   Wynonia Musty [facility] Stay [facility]/Anesthesiology Phone [phone] 06/29/2021 4:09 PM

## 2021-07-05 ENCOUNTER — Encounter (HOSPITAL_COMMUNITY): Payer: Self-pay | Admitting: General Surgery

## 2021-07-05 ENCOUNTER — Ambulatory Visit (HOSPITAL_COMMUNITY): Payer: PPO | Admitting: Anesthesiology

## 2021-07-05 ENCOUNTER — Other Ambulatory Visit: Payer: Self-pay

## 2021-07-05 ENCOUNTER — Encounter (HOSPITAL_COMMUNITY)
Admission: RE | Disposition: A | Discharge status: Home / Self Care | Payer: Self-pay | Source: Home / Self Care | Attending: General Surgery

## 2021-07-05 ENCOUNTER — Ambulatory Visit (HOSPITAL_COMMUNITY): Payer: PPO | Admitting: Physician Assistant

## 2021-07-05 ENCOUNTER — Ambulatory Visit (HOSPITAL_COMMUNITY)
Admission: RE | Admit: 2021-07-05 | Discharge: 2021-07-05 | Disposition: A | Discharge status: Home / Self Care | Payer: PPO | Attending: General Surgery | Admitting: General Surgery

## 2021-07-05 DIAGNOSIS — Z79899 Other long term (current) drug therapy: Secondary | ICD-10-CM | POA: Insufficient documentation

## 2021-07-05 DIAGNOSIS — Z9989 Dependence on other enabling machines and devices: Secondary | ICD-10-CM | POA: Diagnosis not present

## 2021-07-05 DIAGNOSIS — Z6831 Body mass index (BMI) 31.0-31.9, adult: Secondary | ICD-10-CM | POA: Insufficient documentation

## 2021-07-05 DIAGNOSIS — Z8546 Personal history of malignant neoplasm of prostate: Secondary | ICD-10-CM | POA: Diagnosis not present

## 2021-07-05 DIAGNOSIS — Z7951 Long term (current) use of inhaled steroids: Secondary | ICD-10-CM | POA: Diagnosis not present

## 2021-07-05 DIAGNOSIS — E1142 Type 2 diabetes mellitus with diabetic polyneuropathy: Secondary | ICD-10-CM | POA: Diagnosis not present

## 2021-07-05 DIAGNOSIS — Z885 Allergy status to narcotic agent status: Secondary | ICD-10-CM | POA: Diagnosis not present

## 2021-07-05 DIAGNOSIS — E669 Obesity, unspecified: Secondary | ICD-10-CM | POA: Diagnosis not present

## 2021-07-05 DIAGNOSIS — K420 Umbilical hernia with obstruction, without gangrene: Secondary | ICD-10-CM | POA: Diagnosis not present

## 2021-07-05 DIAGNOSIS — Z8042 Family history of malignant neoplasm of prostate: Secondary | ICD-10-CM | POA: Diagnosis not present

## 2021-07-05 DIAGNOSIS — Z7901 Long term (current) use of anticoagulants: Secondary | ICD-10-CM | POA: Diagnosis not present

## 2021-07-05 DIAGNOSIS — Z7984 Long term (current) use of oral hypoglycemic drugs: Secondary | ICD-10-CM | POA: Diagnosis not present

## 2021-07-05 DIAGNOSIS — Z86718 Personal history of other venous thrombosis and embolism: Secondary | ICD-10-CM | POA: Diagnosis not present

## 2021-07-05 DIAGNOSIS — G4733 Obstructive sleep apnea (adult) (pediatric): Secondary | ICD-10-CM | POA: Diagnosis not present

## 2021-07-05 DIAGNOSIS — K219 Gastro-esophageal reflux disease without esophagitis: Secondary | ICD-10-CM | POA: Diagnosis not present

## 2021-07-05 DIAGNOSIS — I480 Paroxysmal atrial fibrillation: Secondary | ICD-10-CM | POA: Diagnosis not present

## 2021-07-05 DIAGNOSIS — Z8249 Family history of ischemic heart disease and other diseases of the circulatory system: Secondary | ICD-10-CM | POA: Diagnosis not present

## 2021-07-05 HISTORY — PX: UMBILICAL HERNIA REPAIR: SHX196

## 2021-07-05 LAB — GLUCOSE, CAPILLARY
Glucose-Capillary: 201 mg/dL — ABNORMAL HIGH (ref 70–99)
Glucose-Capillary: 154 mg/dL — ABNORMAL HIGH (ref 70–99)

## 2021-07-05 SURGERY — REPAIR, HERNIA, UMBILICAL, ADULT
Anesthesia: General

## 2021-07-05 MED ORDER — LACTATED RINGERS IV SOLN: INTRAVENOUS | Status: DC

## 2021-07-05 MED ORDER — ROCURONIUM BROMIDE 10 MG/ML (PF) SYRINGE
PREFILLED_SYRINGE | INTRAVENOUS | Status: AC
  Filled 2021-07-05: qty 10

## 2021-07-05 MED ORDER — HYDROCODONE-ACETAMINOPHEN 5-325 MG PO TABS: 1.0000 | ORAL_TABLET | Freq: Four times a day (QID) | ORAL | 0 refills | Status: DC | PRN

## 2021-07-05 MED ORDER — PHENYLEPHRINE HCL-NACL 20-0.9 MG/250ML-% IV SOLN
INTRAVENOUS | Status: DC | PRN
  Administered 2021-07-05: 75 ug/min via INTRAVENOUS

## 2021-07-05 MED ORDER — SUGAMMADEX SODIUM 200 MG/2ML IV SOLN
INTRAVENOUS | Status: DC | PRN
  Administered 2021-07-05: 200 mg via INTRAVENOUS

## 2021-07-05 MED ORDER — MIDAZOLAM HCL 5 MG/5ML IJ SOLN
INTRAMUSCULAR | Status: DC | PRN
  Administered 2021-07-05: 2 mg via INTRAVENOUS

## 2021-07-05 MED ORDER — ONDANSETRON HCL 4 MG/2ML IJ SOLN
INTRAMUSCULAR | Status: AC
  Filled 2021-07-05: qty 2

## 2021-07-05 MED ORDER — BUPIVACAINE HCL (PF) 0.5 % IJ SOLN
INTRAMUSCULAR | Status: AC
  Filled 2021-07-05: qty 30

## 2021-07-05 MED ORDER — CHLORHEXIDINE GLUCONATE CLOTH 2 % EX PADS: 6.0000 | MEDICATED_PAD | Freq: Once | CUTANEOUS | Status: DC

## 2021-07-05 MED ORDER — GABAPENTIN 300 MG PO CAPS
300.0000 mg | ORAL_CAPSULE | ORAL | Status: AC
  Administered 2021-07-05: 300 mg via ORAL
  Filled 2021-07-05: qty 1

## 2021-07-05 MED ORDER — EPHEDRINE 5 MG/ML INJ
INTRAVENOUS | Status: AC
  Filled 2021-07-05: qty 5

## 2021-07-05 MED ORDER — ACETAMINOPHEN 500 MG PO TABS
1000.0000 mg | ORAL_TABLET | ORAL | Status: AC
  Administered 2021-07-05: 1000 mg via ORAL
  Filled 2021-07-05: qty 2

## 2021-07-05 MED ORDER — CEFAZOLIN SODIUM-DEXTROSE 2-4 GM/100ML-% IV SOLN
2.0000 g | INTRAVENOUS | Status: AC
  Administered 2021-07-05: 2 g via INTRAVENOUS
  Filled 2021-07-05: qty 100

## 2021-07-05 MED ORDER — PROPOFOL 10 MG/ML IV BOLUS
INTRAVENOUS | Status: AC
  Filled 2021-07-05: qty 20

## 2021-07-05 MED ORDER — PHENYLEPHRINE 40 MCG/ML (10ML) SYRINGE FOR IV PUSH (FOR BLOOD PRESSURE SUPPORT)
PREFILLED_SYRINGE | INTRAVENOUS | Status: DC | PRN
  Administered 2021-07-05 (×2): 80 ug via INTRAVENOUS
  Administered 2021-07-05: 120 ug via INTRAVENOUS

## 2021-07-05 MED ORDER — FENTANYL CITRATE (PF) 250 MCG/5ML IJ SOLN
INTRAMUSCULAR | Status: AC
  Filled 2021-07-05: qty 5

## 2021-07-05 MED ORDER — LIDOCAINE 2% (20 MG/ML) 5 ML SYRINGE
INTRAMUSCULAR | Status: DC | PRN
  Administered 2021-07-05: 60 mg via INTRAVENOUS

## 2021-07-05 MED ORDER — DEXAMETHASONE SODIUM PHOSPHATE 4 MG/ML IJ SOLN
INTRAMUSCULAR | Status: DC | PRN
  Administered 2021-07-05: 10 mg via INTRAVENOUS

## 2021-07-05 MED ORDER — PHENYLEPHRINE 40 MCG/ML (10ML) SYRINGE FOR IV PUSH (FOR BLOOD PRESSURE SUPPORT)
PREFILLED_SYRINGE | INTRAVENOUS | Status: AC
  Filled 2021-07-05: qty 10

## 2021-07-05 MED ORDER — BUPIVACAINE-EPINEPHRINE 0.5% -1:200000 IJ SOLN
INTRAMUSCULAR | Status: DC | PRN
  Administered 2021-07-05: 20 mL

## 2021-07-05 MED ORDER — EPHEDRINE SULFATE-NACL 50-0.9 MG/10ML-% IV SOSY
PREFILLED_SYRINGE | INTRAVENOUS | Status: DC | PRN
  Administered 2021-07-05 (×2): 10 mg via INTRAVENOUS
  Administered 2021-07-05: 5 mg via INTRAVENOUS

## 2021-07-05 MED ORDER — ORAL CARE MOUTH RINSE: 15.0000 mL | Freq: Once | OROMUCOSAL | Status: AC

## 2021-07-05 MED ORDER — FENTANYL CITRATE (PF) 100 MCG/2ML IJ SOLN
INTRAMUSCULAR | Status: DC | PRN
  Administered 2021-07-05 (×2): 50 ug via INTRAVENOUS

## 2021-07-05 MED ORDER — MIDAZOLAM HCL 2 MG/2ML IJ SOLN
INTRAMUSCULAR | Status: AC
  Filled 2021-07-05: qty 2

## 2021-07-05 MED ORDER — CHLORHEXIDINE GLUCONATE 0.12 % MT SOLN
15.0000 mL | Freq: Once | OROMUCOSAL | Status: AC
  Administered 2021-07-05: 15 mL via OROMUCOSAL
  Filled 2021-07-05: qty 15

## 2021-07-05 MED ORDER — ROCURONIUM BROMIDE 100 MG/10ML IV SOLN
INTRAVENOUS | Status: DC | PRN
  Administered 2021-07-05: 60 mg via INTRAVENOUS

## 2021-07-05 MED ORDER — DEXAMETHASONE SODIUM PHOSPHATE 10 MG/ML IJ SOLN
INTRAMUSCULAR | Status: AC
  Filled 2021-07-05: qty 1

## 2021-07-05 MED ORDER — PROPOFOL 10 MG/ML IV BOLUS
INTRAVENOUS | Status: AC
  Filled 2021-07-05: qty 40

## 2021-07-05 MED ORDER — ONDANSETRON HCL 4 MG/2ML IJ SOLN
INTRAMUSCULAR | Status: DC | PRN
  Administered 2021-07-05: 4 mg via INTRAVENOUS

## 2021-07-05 MED ORDER — PROPOFOL 10 MG/ML IV BOLUS
INTRAVENOUS | Status: DC | PRN
  Administered 2021-07-05: 30 mg via INTRAVENOUS
  Administered 2021-07-05: 40 mg via INTRAVENOUS
  Administered 2021-07-05: 170 mg via INTRAVENOUS

## 2021-07-05 SURGICAL SUPPLY — 32 items
BLADE CLIPPER SURG (BLADE) ×2 IMPLANT
CANISTER SUCT 3000ML PPV (MISCELLANEOUS) ×2 IMPLANT
CHLORAPREP W/TINT 26 (MISCELLANEOUS) ×2 IMPLANT
COVER SURGICAL LIGHT HANDLE (MISCELLANEOUS) ×2 IMPLANT
DERMABOND ADHESIVE PROPEN (GAUZE/BANDAGES/DRESSINGS) ×1
DERMABOND ADVANCED .7 DNX6 (GAUZE/BANDAGES/DRESSINGS) ×1 IMPLANT
DRAPE LAPAROTOMY 100X72 PEDS (DRAPES) ×2 IMPLANT
ELECT REM PT RETURN 9FT ADLT (ELECTROSURGICAL) ×2
ELECTRODE REM PT RTRN 9FT ADLT (ELECTROSURGICAL) ×1 IMPLANT
GLOVE SRG 8 PF TXTR STRL LF DI (GLOVE) ×1 IMPLANT
GLOVE SURG ENC MOIS LTX SZ8 (GLOVE) ×2 IMPLANT
GLOVE SURG UNDER POLY LF SZ8 (GLOVE) ×2
GOWN STRL REUS W/ TWL LRG LVL3 (GOWN DISPOSABLE) ×1 IMPLANT
GOWN STRL REUS W/ TWL XL LVL3 (GOWN DISPOSABLE) ×1 IMPLANT
GOWN STRL REUS W/TWL LRG LVL3 (GOWN DISPOSABLE) ×2
GOWN STRL REUS W/TWL XL LVL3 (GOWN DISPOSABLE) ×2
KIT BASIN OR (CUSTOM PROCEDURE TRAY) ×2 IMPLANT
KIT TURNOVER KIT B (KITS) ×2 IMPLANT
MESH VENTRALEX ST 1-7/10 CRC S (Mesh General) ×2 IMPLANT
NEEDLE 22X1 1/2 (OR ONLY) (NEEDLE) ×2 IMPLANT
NS IRRIG 1000ML POUR BTL (IV SOLUTION) ×2 IMPLANT
PACK GENERAL/GYN (CUSTOM PROCEDURE TRAY) ×2 IMPLANT
PAD ARMBOARD 7.5X6 YLW CONV (MISCELLANEOUS) ×2 IMPLANT
SUT MNCRL AB 4-0 PS2 18 (SUTURE) ×2 IMPLANT
SUT VIC AB 2-0 CT1 27 (SUTURE) ×2
SUT VIC AB 2-0 CT1 TAPERPNT 27 (SUTURE) ×1 IMPLANT
SUT VIC AB 2-0 SH 27 (SUTURE) ×2
SUT VIC AB 2-0 SH 27XBRD (SUTURE) ×1 IMPLANT
SUT VIC AB 3-0 SH 27 (SUTURE) ×2
SUT VIC AB 3-0 SH 27XBRD (SUTURE) ×1 IMPLANT
SYR CONTROL 10ML LL (SYRINGE) ×2 IMPLANT
TOWEL GREEN STERILE (TOWEL DISPOSABLE) ×2 IMPLANT

## 2021-07-05 NOTE — Transfer of Care (Signed)
Immediate Anesthesia Transfer of Care Note  Patient: Jeremy Tucker  Procedure(s) Performed: REPAIR INCARCERATED UMBILICAL HERNIA WITH MESH  Patient Location: PACU  Anesthesia Type:General  Level of Consciousness: awake and alert   Airway & Oxygen Therapy: Patient Spontanous Breathing and Patient connected to face mask oxygen  Post-op Assessment: Report given to RN and Post -op Vital signs reviewed and stable  Post vital signs: Reviewed and stable  Last Vitals:  Vitals Value Taken Time  BP 125/71 07/05/21 0840  Temp    Pulse 82 07/05/21 0841  Resp 22 07/05/21 0841  SpO2 88 % 07/05/21 0841  Vitals shown include unvalidated device data.  Last Pain:  Vitals:   07/05/21 0641  TempSrc:   PainSc: 0-No pain         Complications: No notable events documented.

## 2021-07-05 NOTE — Addendum Note (Signed)
Addendum  created 07/05/21 [id number] by Lyn Hollingshead, MD   Clinical Note Signed

## 2021-07-05 NOTE — Anesthesia Postprocedure Evaluation (Signed)
Anesthesia Post Note  Patient: Jeremy Tucker  Procedure(s) Performed: REPAIR INCARCERATED UMBILICAL HERNIA WITH MESH     Patient location during evaluation: PACU Anesthesia Type: General Level of consciousness: awake and sedated Pain management: pain level controlled Vital Signs Assessment: post-procedure vital signs reviewed and stable Respiratory status: spontaneous breathing Cardiovascular status: stable Postop Assessment: no apparent nausea or vomiting Anesthetic complications: no   No notable events documented.  Last Vitals:  Vitals:   07/05/21 0554 07/05/21 0840  BP: (!) 163/67 125/71  Pulse: 75   Resp: 19 (!) 22  Temp: 36.8 C 36.8 C  SpO2: 95% 92%    Last Pain:  Vitals:   07/05/21 0641  TempSrc:   PainSc: 0-No pain   Pain Goal:                   Huston Foley

## 2021-07-05 NOTE — Anesthesia Procedure Notes (Signed)
Procedure Name: Intubation Date/Time: 07/05/2021 7:32 AM Performed by: Lieutenant Diego, CRNA Pre-anesthesia Checklist: Patient identified, Emergency Drugs available, Suction available and Patient being monitored Patient Re-evaluated:Patient Re-evaluated prior to induction Oxygen Delivery Method: Circle system utilized Preoxygenation: Pre-oxygenation with 100% oxygen Induction Type: IV induction Ventilation: Mask ventilation without difficulty Laryngoscope Size: Miller and 2 Grade View: Grade II Tube type: Oral Number of attempts: 1 Airway Equipment and Method: Stylet Placement Confirmation: ETT inserted through vocal cords under direct vision, positive ETCO2 and breath sounds checked- equal and bilateral Secured at: 23 cm Tube secured with: Tape Dental Injury: Teeth and Oropharynx as per pre-operative assessment

## 2021-07-05 NOTE — Op Note (Signed)
  07/05/2021  8:27 AM  PATIENT:  Jeremy Tucker  70 y.o. male  PRE-OPERATIVE DIAGNOSIS:  INCARCERATED UMBILICAL HERNIA  POST-OPERATIVE DIAGNOSIS:  INCARCERATED UMBILICAL HERNIA  PROCEDURE:  Procedure(s): REPAIR INCARCERATED UMBILICAL HERNIA WITH MESH (4.3CM VENTRILEX)  SURGEON:  Surgeon(s): Georganna Skeans, MD  ASSISTANTS: none   ANESTHESIA:   local and general  EBL:  Total I/O In: 400 [I.V.:400] Out: 20 [Blood:20]  BLOOD ADMINISTERED:none  DRAINS: none   SPECIMEN:  No Specimen  DISPOSITION OF SPECIMEN:  N/A  COUNTS:  YES  DICTATION: .Dragon Dictation Findings: Incarcerated umbilical hernia containing omentum  Procedure in detail: Informed consent was obtained.  He was brought to the operating room.  General endotracheal anesthesia was administered by the anesthesia staff.  His abdomen was prepped and draped in a sterile fashion.  We did a timeout procedure.  Local anesthetic was injected and an infraumbilical incision was made.  Subcutaneous tissues were dissected down.  I circumferentially dissected the umbilicus and freed the umbilical skin from the underlying tissues.  The hernia sac was entered.  It just contained omentum.  The sac was excised.  This chronically incarcerated small piece of omentum was excised.  Hemostasis was obtained with cautery.  There was a little bit of bleeding along the right fascial edge so I placed a figure-of-eight 2-0 Vicryl.  There was good hemostasis.  The hernia defect was about 2 cm.  I repaired the hernia with a 4.3 cm circular mesh.  This was inserted and laid flat.  Superiorly and inferiorly was tacked to the fascia with interrupted 0 Prolene sutures.  I then placed a few 0 Prolene sutures to close the fascia over the top of the mesh.  The area was irrigated.  Hemostasis was ensured.  I injected some further local.  The umbilicus was tacked back down to the underlying tissues with 2-0 Vicryl.  Subcutaneous tissues were closed with interrupted  3-0 Vicryl.  The skin was closed with 4-0 Monocryl followed by Dermabond.  All counts were correct.  He tolerated the procedure without apparent complication and was taken recovery in stable condition. PATIENT DISPOSITION:  PACU - hemodynamically stable.   Delay start of Pharmacological VTE agent (>24hrs) due to surgical blood loss or risk of bleeding:  no  Georganna Skeans, MD, MPH, FACS Pager: [phone]  8/22/20228:27 AM

## 2021-07-05 NOTE — H&P (Signed)
Jeremy Tucker is an 70 y.o. male.   Chief Complaint: incarcerated umbilical hernia HPI: Presents for repair incarcerated umbilical hernia with mesh. He had undergone treatment for prostate cancer since I saw him in the office. No changes in his hernia.  Past Medical History:  Diagnosis Date   Anxiety    BPH (benign prostatic hyperplasia)    BPH without obstruction/lower urinary tract symptoms    Depression    Diabetic peripheral neuropathy ([place]) 01/07/2019   Dysrhythmia    ED (erectile dysfunction)    Essential hypertension    GERD (gastroesophageal reflux disease)    Heart murmur    Hiatal hernia    History of chronic bronchitis    per pt used inhaler as needed   History of DVT of lower extremity 2001   s/p  left TKA completed blood thinner treatment,  per pt no previous clot and none since   History of gastric polyp    per pt benign   History of kidney stones    Hyperlipidemia    Mild CAD cardiologist--- dr h. Tamala Julian   nuclear study 04-14-2017 normal , low risk, nuclear ef 53%;  cardiac cath 09-19-2018  midRCA 30% stenosis otherwise normal coronaries,  heavy AV calicification with decreased mobility with peak grandiant 7mHg   Mild dilation of ascending [place]    per echo 01-13-2021  360m  Nephrolithiasis    OA (osteoarthritis)    OSA (obstructive sleep apnea)    per pt refused cpap, intolerant;  moderate osa per study in epic 03-08-2015   PAF (paroxysmal atrial fibrillation) ([facility]   followed by dr h. 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   [facility]/  oncology-- dr maTammi Klippel first dx 03/ 2021,  Glesaon 3+4  PSA 6.95   Pulmonary nodule 2019   incidently finding on CTA 09-25-2018, stable   Right carpal tunnel syndrome 01/07/2019   Type 2 diabetes mellitus ([facility]   followed by pcp   (03-02-2021  pt stated does not check blood sugar at home)   Umbilical hernia      Past Surgical History:  Procedure Laterality Date   ATRIAL FIBRILLATION ABLATION N/A 03/12/2015   Procedure: ATRIAL FIBRILLATION ABLATION;  Surgeon: JaThompson GrayerMD;  [facility];  Service: Cardiovascular;  Laterality: N/A;   CARDIAC CATHETERIZATION  1990's   "Dr. GaMelvern Banker no blockages per pt   CARDIOVERSION N/A 02/25/2015   Procedure: CARDIOVERSION;  Surgeon: TrSueanne MargaritaMD;  [facility];  Service: Cardiovascular;  Laterality: N/A;   CATARACT EXTRACTION W/ INTRAOCULAR LENS  IMPLANT, BILATERAL  2016   COLONOSCOPY  last one 2013  dr meEarlean Shawl CYSTOSCOPY N/A 03/08/2021   Procedure: CYSTOSCOPY BLADDER CALCULI EXTRACTION ;  Surgeon: DaFranchot GalloMD;  [facility];  Laterality: N/A;   CYSTOSCOPY WITH RETROGRADE PYELOGRAM, URETEROSCOPY AND STENT PLACEMENT Left 04/24/2015   Procedure: URETHRAL MEATAL DILATION, CYSTOSCOPY WITH LEFT RETROGRADE PYELOGRAM, LEFT URETEROSCOPY, STONE BASKET EXTRACTION,  LEFT DOUBLE J STENT PLACEMENT;  Surgeon: SiCarolan ClinesMD;  [facility];  Service: Urology;  Laterality: Left;   EP IMPLANTABLE DEVICE N/A 09/15/2015   Procedure: Loop Recorder Insertion;  Surgeon: JaThompson GrayerMD;  [facility];  Service: Cardiovascular;  Laterality: N/A;   ESOPHAGOGASTRODUODENOSCOPY  last one 2017   EXTRACORPOREAL SHOCK WAVE  LITHOTRIPSY Bilateral 04/02/2020   Procedure: EXTRACORPOREAL SHOCK WAVE LITHOTRIPSY (ESWL);  Surgeon: Cleon Gustin, MD;  Location[facility];  Service: Urology;  Laterality: Bilateral;   EXTRACORPOREAL SHOCK WAVE LITHOTRIPSY Left 04/06/2020   Procedure: EXTRACORPOREAL SHOCK WAVE LITHOTRIPSY (ESWL);  Surgeon: [name], MD;  Location[facility];  Service: Urology;  Laterality: Left;   EYE SURGERY     HOLMIUM LASER APPLICATION Left [id number]   Procedure: HOLMIUM LASER APPLICATION;  Surgeon: Carolan Clines,  MD;  [facility];  Service: Urology;  Laterality: Left;   implantable loopr recorder removal  02/22/2021   MDT LINQ removed in office by Dr Allred   JOINT REPLACEMENT     KNEE ARTHROSCOPY Bilateral "multiple times"   LEFT HEART CATH AND CORONARY ANGIOGRAPHY N/A 09/19/2018   Procedure: LEFT HEART CATH AND CORONARY ANGIOGRAPHY;  Surgeon: Belva Crome, MD;  Location: [place] CV LAB;  Service: Cardiovascular;  Laterality: N/A;   PERCUTANEOUS NEPHROSTOLITHOTOMY  1980s and 1990s   RADIOACTIVE SEED IMPLANT N/A 03/08/2021   Procedure: RADIOACTIVE SEED IMPLANT/BRACHYTHERAPY IMPLANT;  Surgeon: Franchot Gallo, MD;  Location: [facility];  Service: Urology;  Laterality: N/A;  90 MINS   SKIN BIOPSY Left 05/06/2020   Shave biopsy left posterior hand neurofiberoma.   SPACE OAR INSTILLATION N/A 03/08/2021   Procedure: SPACE OAR INSTILLATION;  Surgeon: Franchot Gallo, MD;  [facility];  Service: Urology;  Laterality: N/A;   TEE WITHOUT CARDIOVERSION N/A 03/12/2015   Procedure: TRANSESOPHAGEAL ECHOCARDIOGRAM (TEE);  Surgeon: Pixie Casino, MD;  Location[facility];  Service: Cardiovascular;  Laterality: N/A;   TONSILLECTOMY  age 39   TOTAL KNEE ARTHROPLASTY Left 11-22-1999  '@[facility]    TOTAL KNEE ARTHROPLASTY Right 10/30/2017   Procedure: RIGHT TOTAL KNEE ARTHROPLASTY;  Surgeon: Paralee Cancel, MD;  [facility];  Service: Orthopedics;  Laterality: Right;  90 mins    Family History  Problem Relation Age of Onset   Heart attack Mother 25       MI   Heart attack Father 66       ? MI versus trauma to head   Healthy Brother    Cancer Maternal Grandfather        had prostate removed   Breast cancer Neg Hx    Colon cancer Neg Hx    Pancreatic cancer Neg Hx    Prostate cancer Neg Hx    Social History:  reports that he has never smoked. He has never used smokeless tobacco. He reports current alcohol use. He reports that he does not use  drugs.  Allergies:  Allergies  Allergen Reactions   Oxycodone Itching    Medications Prior to Admission  Medication Sig Dispense Refill   acetaminophen (TYLENOL) 500 MG tablet Take 1,000 mg by mouth every 8 (eight) hours as needed for mild pain.      albuterol (VENTOLIN HFA) 108 (90 Base) MCG/ACT inhaler INHALE 2 PUFFS INTO THE LUNGS EVERY 6 HOURS AS NEEDED FOR WHEEZING OR SHORTNESS OF BREATH 54 g 0   allopurinol (ZYLOPRIM) 300 MG tablet Take 1 tablet (300 mg total) by mouth daily. 90 tablet 3   ammonium lactate (LAC-HYDRIN) 12 % lotion Apply 1 application topically daily as needed for dry skin.     atorvastatin (LIPITOR) 40 MG tablet Take 1 tablet (40 mg total) by mouth daily. (Patient taking differently: Take 40 mg by mouth at bedtime.) 90 tablet 3   buPROPion (WELLBUTRIN XL) 300  MG 24 hr tablet TAKE ONE TABLET BY MOUTH DAILY (Patient taking differently: Take 150 mg by mouth daily.) 30 tablet 0   diltiazem (TIAZAC) 180 MG 24 hr capsule Take 1 capsule (180 mg total) by mouth daily. 90 capsule 3   ELIQUIS 5 MG TABS tablet TAKE ONE TABLET BY MOUTH TWICE A DAY 180 tablet 1   fluticasone (FLONASE) 50 MCG/ACT nasal spray Place 2 sprays into both nostrils daily as needed for allergies.     loratadine (CLARITIN) 10 MG tablet Take 10 mg by mouth daily.     LORazepam (ATIVAN) 0.5 MG tablet TAKE 1 TABLET TWICE DAILY AS NEEDED FOR ANXIETY. 20 tablet 0   metFORMIN (GLUCOPHAGE) 500 MG tablet Take 1 tablet (500 mg total) by mouth 2 (two) times daily with a meal. 180 tablet 3   Multiple Vitamins-Minerals (MULTIVITAMIN WITH MINERALS) tablet Take 1 tablet by mouth daily.     Omega-3 Fatty Acids (FISH OIL) 1000 MG CAPS Take 1,000 mg by mouth daily.     omeprazole (PRILOSEC) 20 MG capsule Take 20 mg by mouth daily.     sertraline (ZOLOFT) 50 MG tablet TAKE ONE TABLET BY MOUTH DAILY 90 tablet 0   sildenafil (VIAGRA) 100 MG tablet Take 100 mg by mouth daily as needed for erectile dysfunction.     tamsulosin  (FLOMAX) 0.4 MG CAPS capsule Take 0.8 mg by mouth in the morning and at bedtime.     zolpidem (AMBIEN) 10 MG tablet Take 10 mg by mouth at bedtime as needed for sleep.     atorvastatin (LIPITOR) 20 MG tablet TAKE ONE TABLET BY MOUTH DAILY (Patient not taking: No sig reported) 30 tablet 0   finasteride (PROSCAR) 5 MG tablet Take 5 mg by mouth daily.     Fluticasone-Salmeterol (ADVAIR DISKUS) 250-50 MCG/DOSE AEPB Inhale 1 puff into the lungs 2 (two) times daily. (Patient taking differently: Inhale 1 puff into the lungs 2 (two) times daily as needed.) 1 each 3   metoprolol succinate (TOPROL-XL) 25 MG 24 hr tablet TAKE ONE TABLET BY MOUTH EVERY EVENING (Patient not taking: No sig reported) 30 tablet 0   olopatadine (PATANOL) 0.1 % ophthalmic solution Place 1 drop into both eyes daily as needed for allergies (tearing).      sulfamethoxazole-trimethoprim (BACTRIM DS) 800-160 MG tablet Take 1 tablet by mouth 2 (two) times daily. 6 tablet 0    Results for orders placed or performed during the hospital encounter of 07/05/21 (from the past 48 hour(s))  Glucose, capillary     Status: Abnormal   Collection Time: 07/05/21  5:58 AM  Result Value Ref Range   Glucose-Capillary 201 (H) 70 - 99 mg/dL    Comment: Glucose reference range applies only to samples taken after fasting for at least 8 hours.   No results found.  Review of Systems  Blood pressure (!) 163/67, pulse 75, temperature 98.2 F (36.8 C), temperature source Oral, resp. rate 19, height 6' (1.829 m), weight 106.6 kg, SpO2 95 %. Physical Exam Constitutional:      Appearance: He is obese.  HENT:     Head: Normocephalic.     Nose: Nose normal.  Cardiovascular:     Rate and Rhythm: Normal rate and regular rhythm.  Pulmonary:     Effort: Pulmonary effort is normal.     Breath sounds: Normal breath sounds.  Abdominal:     General: Abdomen is flat.     Palpations: Abdomen is soft.  Tenderness: There is no abdominal tenderness. There is  no guarding or rebound.     Hernia: A hernia is present.     Comments: Incarcerated umbilical hernia  Musculoskeletal:        General: Normal range of motion.     Cervical back: Normal range of motion.  Skin:    General: Skin is warm.  Neurological:     Mental Status: He is alert and oriented to person, place, and time.  Psychiatric:        Mood and Affect: Mood normal.     Assessment/Plan Incarcerated umbilical hernia - for repair with mesh. Procedure, risks, and benefits D/W him and I discussed the expected post-op course. He agrees.  Zenovia Jarred, MD 07/05/2021, 6:51 AM

## 2021-07-06 ENCOUNTER — Encounter (HOSPITAL_COMMUNITY): Payer: Self-pay | Admitting: General Surgery

## 2021-07-07 ENCOUNTER — Encounter (HOSPITAL_COMMUNITY): Payer: Self-pay | Admitting: General Surgery

## 2021-07-07 MED ORDER — 0.9 % SODIUM CHLORIDE (POUR BTL) OPTIME
TOPICAL | Status: DC | PRN
Start: 2021-07-05 — End: 2021-07-07
  Administered 2021-07-05: 1000 mL

## 2021-07-10 ENCOUNTER — Other Ambulatory Visit: Payer: Self-pay | Admitting: Interventional Cardiology

## 2021-07-12 NOTE — Telephone Encounter (Signed)
Pt last saw Dr [name] 02/22/21, last labs 06/28/21 Creat 0.74, age 70, weight 106.6kg, based on specified criteria pt is on appropriate dosage of Eliquis '5mg'$  BID for afib.  Will refill rx.

## 2021-07-15 ENCOUNTER — Encounter (HOSPITAL_COMMUNITY): Payer: Self-pay | Admitting: General Surgery

## 2021-07-20 ENCOUNTER — Other Ambulatory Visit: Payer: Self-pay

## 2021-07-20 ENCOUNTER — Other Ambulatory Visit: Payer: Self-pay | Admitting: Otolaryngology

## 2021-07-20 ENCOUNTER — Ambulatory Visit (HOSPITAL_COMMUNITY): Payer: PPO | Attending: Internal Medicine

## 2021-07-20 DIAGNOSIS — I350 Nonrheumatic aortic (valve) stenosis: Secondary | ICD-10-CM | POA: Diagnosis not present

## 2021-07-20 LAB — ECHOCARDIOGRAM COMPLETE
AR max vel: 1.09 cm2
AV Area VTI: 0.98 cm2
AV Area mean vel: 1 cm2
AV Mean grad: 44 mmHg
AV Peak grad: 82.4 mmHg
Ao pk vel: 4.54 m/s
Area-P 1/2: 3.19 cm2
S' Lateral: 3.3 cm

## 2021-07-21 ENCOUNTER — Telehealth: Payer: Self-pay | Admitting: Interventional Cardiology

## 2021-07-21 DIAGNOSIS — J301 Allergic rhinitis due to pollen: Secondary | ICD-10-CM | POA: Diagnosis not present

## 2021-07-21 DIAGNOSIS — Z9889 Other specified postprocedural states: Secondary | ICD-10-CM | POA: Diagnosis not present

## 2021-07-21 DIAGNOSIS — H04123 Dry eye syndrome of bilateral lacrimal glands: Secondary | ICD-10-CM | POA: Diagnosis not present

## 2021-07-21 DIAGNOSIS — H43813 Vitreous degeneration, bilateral: Secondary | ICD-10-CM | POA: Diagnosis not present

## 2021-07-21 DIAGNOSIS — Z961 Presence of intraocular lens: Secondary | ICD-10-CM | POA: Diagnosis not present

## 2021-07-21 NOTE — Telephone Encounter (Signed)
[name] is calling because he wants Dr. Tamala Julian to view his MY chart message and he ONLY wants Dr. Tamala Julian to contact him back because he knows Hank personally. He is having a procedure on his nose next Wednesday and he wants Dr. Tamala Julian to advise him if he should be put to sleep.

## 2021-07-21 NOTE — Telephone Encounter (Signed)
He should avoid the upcoming surgery unless it is absolutely necessary or life-threatening to wait.

## 2021-07-21 NOTE — Telephone Encounter (Signed)
Spoke with the patient who states that Dr. Redmond School called him about his echocardiogram results and he would like to know if based on those results the patient can proceed with his procedure next week. He is scheduled for a nasal endoscopic sinus procedure to have polyps removed.   Belva Crome, MD  07/21/2021  8:28 AM EDT     Let the patient know the aortic valve is now severely stenosed. Need to start planning for referral for valve replacement. Needs Left and right heart cath with Cor Angio followed by referral to valve clinic. A copy will be sent to Denita Lung, MD   I have also reviewed results of echocardiogram with the patient. He will need an office visit prior to being set up for heart cath. He currently has a FU visit scheduled for 10/14. Will send to Dr. Tamala Julian and his nurse to see if this appointment needs to be moved up.

## 2021-07-21 NOTE — Progress Notes (Signed)
Chart reviewed with Dr. Daiva Huge. Due to most recent echo, patient will need to have cardiac work up completed prior to surgery. Jeremy Tucker office called and left message that patient will need to be taken off surgery schedule.

## 2021-07-22 NOTE — Telephone Encounter (Signed)
Pt called back and was hoping to get a sooner appt because he is very concerned and would like to get the ball rolling on everything ASAP.  Rescheduled appt to 9/13 with Dayna Dunn, PA-C to discuss scheduling R/L heart cath.  Pt appreciative for call.

## 2021-07-22 NOTE — Telephone Encounter (Signed)
Spoke with pt and made him aware Dr. Tamala Julian recommending holding off on upcoming surgery.  Pt agreeable.  Moved appt with Dr. Tamala Julian up.

## 2021-07-23 ENCOUNTER — Encounter: Payer: Self-pay | Admitting: Physician Assistant

## 2021-07-23 NOTE — H&P (View-Only) (Signed)
Cardiology Office Note    Date:  07/27/2021   ID:  Jeremy Tucker, DOB 04-May-1951, MRN [id number]  PCP:  Denita Lung, MD  Cardiologist:  [name], MD  Electrophysiologist:  [name], MD   Chief Complaint: discuss heart cath and further workup for AS  History of Present Illness:   Jeremy Tucker is a 70 y.o. male with history of progressive aortic stenosis, paroxysmal atrial fib (remote ablation with recurrence), HTN, HLD (managed by PCP), GERD, DM with neuropathy, OSA (did not tolerate CPAP), BPH, remote DVT after knee surgery, hiatal hernia and lung nodules.  He has a prior history of atypical chest pain with cath in 2019 showing minimal coronary atherosclerosis as outlined below (30% RCA), chest pain felt noncardiac. CT at that time did demonstrate 1-47m nodular opacities (no f/u if low risk, 12 month CT if high-risk, do not see repeat since that time). He has previously had ablation with Dr. ARayann Heman Subsequent to this he had palpitations so had ILR implanted remotely in 2016 confirming recurrence of PAF. Some of the episodes were also felt to represent NSR with PACs. Last Linq interrogation was in 12/2018, explanted 02/2021. He has been on anticoagulation. His aortic stenosis has been followed by serial echocardiograms with last surveillance echo 60-65%, moderate asymmetric basal septal hypertrophy, grade 1 DD moderate LAE, mild MR, mild AI, severe AS. He now presents for pre-surgical planning.   He is seen back for follow-up today overall clinically stable. Overall he is doing OK, but retrospectively he has been noticing progressive exertional dyspnea for several months now. He still has rare fleeting chest pains lasting a few seconds a few times a day. No exertional anginal. He stopped his metoprolol about a month ago because he had some dizziness and BP was 89/59 on occasion. His BP has since normalized. He has not had any syncope or edema. He has urology follow-up on Friday to  discuss next steps of his prostate treatment.  Labwork independently reviewed: 06/2021 CBC wnl, K 3.9, Cr 0.74 03/2021 LDL 66, LFTs wnl 2017 TSH wnl   Past Medical History:  Diagnosis Date   Anxiety    Aortic stenosis    BPH (benign prostatic hyperplasia)    BPH without obstruction/lower urinary tract symptoms    Depression    Diabetic peripheral neuropathy ([place] 01/07/2019   ED (erectile dysfunction)    Essential hypertension    GERD (gastroesophageal reflux disease)    Heart murmur    Hiatal hernia    History of chronic bronchitis    per pt used inhaler as needed   History of DVT of lower extremity 2001   s/p  left TKA completed blood thinner treatment,  per pt no previous clot and none since   History of gastric polyp    per pt benign   History of kidney stones    Hyperlipidemia    Mild CAD cardiologist--- dr h. sTamala Julian  nuclear study 04-14-2017 normal , low risk, nuclear ef 53%;  cardiac cath 09-19-2018  midRCA 30% stenosis otherwise normal coronaries,  heavy AV calicification with decreased mobility with peak grandiant 349mg   Mild dilation of ascending aorta ([facility]   per echo 01-13-2021  3842m Nephrolithiasis    OA (osteoarthritis)    OSA (obstructive sleep apnea)    per pt refused cpap, intolerant;  moderate osa per study in epic 03-08-2015   PAF ([place]oxysmal [place] fibrillation) [place]  followed by  dr h. 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/2018   Prostate cancer [facility]) urologist--- dr dahlstedt/  oncology-- dr Tammi Klippel   first dx 03/ 2021,  Glesaon 3+4  PSA 6.95   Pulmonary nodule 2019   incidently finding on CTA 09-25-2018, stable   Right carpal tunnel syndrome 01/07/2019   Type 2 diabetes mellitus ([place])    followed by pcp   (03-02-2021  pt stated does not check blood sugar at home)   Umbilical hernia     Past Surgical History:  Procedure Laterality Date   ATRIAL FIBRILLATION ABLATION N/A  03/12/2015   Procedure: ATRIAL FIBRILLATION ABLATION;  Surgeon: [name], MD;  Location: [facility] CATH LAB;  Service: Cardiovascular;  Laterality: N/A;   CARDIAC CATHETERIZATION  1990's   "Dr. Melvern Banker", no blockages per pt   CARDIOVERSION N/A 02/25/2015   Procedure: CARDIOVERSION;  Surgeon: Sueanne Margarita, MD;  Location: [facility];  Service: Cardiovascular;  Laterality: N/A;   CATARACT EXTRACTION W/ INTRAOCULAR LENS  IMPLANT, BILATERAL  2016   COLONOSCOPY  last one 2013  dr Earlean Shawl   CYSTOSCOPY N/A 03/08/2021   Procedure: CYSTOSCOPY BLADDER CALCULI EXTRACTION ;  Surgeon: Franchot Gallo, MD;  Location: [facility];  Service: Urology;  Laterality: N/A;   CYSTOSCOPY WITH RETROGRADE PYELOGRAM, URETEROSCOPY AND STENT PLACEMENT Left 04/24/2015   Procedure: URETHRAL MEATAL DILATION, CYSTOSCOPY WITH LEFT RETROGRADE PYELOGRAM, LEFT URETEROSCOPY, STONE BASKET EXTRACTION,  LEFT DOUBLE J STENT PLACEMENT;  Surgeon: Carolan Clines, MD;  Location: [facility];  Service: Urology;  Laterality: Left;   EP IMPLANTABLE DEVICE N/A 09/15/2015   Procedure: Loop Recorder Insertion;  Surgeon: [name], MD;  Location: [facility] CV LAB;  Service: Cardiovascular;  Laterality: N/A;   ESOPHAGOGASTRODUODENOSCOPY  last one 2017   EXTRACORPOREAL SHOCK WAVE LITHOTRIPSY Bilateral 04/02/2020   Procedure: EXTRACORPOREAL SHOCK WAVE LITHOTRIPSY (ESWL);  Surgeon: Cleon Gustin, MD;  Location: [facility];  Service: Urology;  Laterality: Bilateral;   EXTRACORPOREAL SHOCK WAVE LITHOTRIPSY Left 04/06/2020   Procedure: EXTRACORPOREAL SHOCK WAVE LITHOTRIPSY (ESWL);  Surgeon: [name], MD;  Location: [facility];  Service: Urology;  Laterality: Left;   EYE SURGERY     HOLMIUM LASER APPLICATION Left [id number]   Procedure: HOLMIUM LASER APPLICATION;  Surgeon: Carolan Clines, MD;  Location: [facility];  Service: Urology;  Laterality: Left;   implantable loopr recorder  removal  02/22/2021   MDT LINQ removed in office by Dr Allred   JOINT REPLACEMENT     KNEE ARTHROSCOPY Bilateral "multiple times"   LEFT HEART CATH AND CORONARY ANGIOGRAPHY N/A 09/19/2018   Procedure: LEFT HEART CATH AND CORONARY ANGIOGRAPHY;  Surgeon: Belva Crome, MD;  Location: [facility] CV [facility];  Service: Cardiovascular;  Laterality: N/A;   PERCUTANEOUS NEPHROSTOLITHOTOMY  1980s and 1990s   RADIOACTIVE SEED IMPLANT N/A 03/08/2021   Procedure: RADIOACTIVE SEED IMPLANT/BRACHYTHERAPY IMPLANT;  Surgeon: Franchot Gallo, MD;  Location: [facility];  Service: Urology;  Laterality: N/A;  90 MINS   SKIN BIOPSY Left 05/06/2020   Shave biopsy left posterior hand neurofiberoma.   SPACE OAR INSTILLATION N/A 03/08/2021   Procedure: SPACE OAR INSTILLATION;  Surgeon: Franchot Gallo, MD;  Location: [facility];  Service: Urology;  Laterality: N/A;   TEE WITHOUT CARDIOVERSION N/A 03/12/2015   Procedure: TRANSESOPHAGEAL ECHOCARDIOGRAM (TEE);  Surgeon: Pixie Casino, MD;  Location: [place];  Service: Cardiovascular;  Laterality:  N/A;   TONSILLECTOMY  age 9   TOTAL KNEE ARTHROPLASTY Left 11-22-1999  '@[facility]'$    TOTAL KNEE ARTHROPLASTY Right 10/30/2017   Procedure: RIGHT TOTAL KNEE ARTHROPLASTY;  Surgeon: Paralee Cancel, MD;  Location: [facility];  Service: Orthopedics;  Laterality: Right;  90 mins   UMBILICAL HERNIA REPAIR N/A 07/05/2021   Procedure: REPAIR INCARCERATED UMBILICAL HERNIA WITH MESH;  Surgeon: Georganna Skeans, MD;  Location: [facility];  Service: General;  Laterality: N/A;    Current Medications: Current Meds  Medication Sig   acetaminophen (TYLENOL) 500 MG tablet Take 1,000 mg by mouth every 8 (eight) hours as needed for mild pain.    albuterol (VENTOLIN HFA) 108 (90 Base) MCG/ACT inhaler INHALE 2 PUFFS INTO THE LUNGS EVERY 6 HOURS AS NEEDED FOR WHEEZING OR SHORTNESS OF BREATH   allopurinol (ZYLOPRIM) 300 MG tablet Take 1 tablet (300 mg total) by mouth daily.    ammonium lactate (LAC-HYDRIN) 12 % lotion Apply 1 application topically daily as needed for dry skin.   buPROPion (WELLBUTRIN XL) 300 MG 24 hr tablet Take 1 tablet (300 mg total) by mouth daily.   diltiazem (TIAZAC) 180 MG 24 hr capsule Take 1 capsule (180 mg total) by mouth daily.   ELIQUIS 5 MG TABS tablet TAKE ONE TABLET BY MOUTH TWICE A DAY   finasteride (PROSCAR) 5 MG tablet Take 5 mg by mouth daily.   fluticasone (FLONASE) 50 MCG/ACT nasal spray Place 2 sprays into both nostrils daily as needed for allergies.   Fluticasone-Salmeterol (ADVAIR DISKUS) 250-50 MCG/DOSE AEPB Inhale 1 puff into the lungs 2 (two) times daily. (Patient taking differently: Inhale 1 puff into the lungs 2 (two) times daily as needed.)   HYDROcodone-acetaminophen (NORCO/VICODIN) 5-325 MG tablet Take 1 tablet by mouth every 6 (six) hours as needed for moderate pain.   loratadine (CLARITIN) 10 MG tablet Take 10 mg by mouth daily.   LORazepam (ATIVAN) 0.5 MG tablet TAKE 1 TABLET TWICE DAILY AS NEEDED FOR ANXIETY.   metFORMIN (GLUCOPHAGE) 500 MG tablet Take 1 tablet (500 mg total) by mouth 2 (two) times daily with a meal.   Multiple Vitamins-Minerals (MULTIVITAMIN WITH MINERALS) tablet Take 1 tablet by mouth daily.   olopatadine (PATANOL) 0.1 % ophthalmic solution Place 1 drop into both eyes daily as needed for allergies (tearing).    Omega-3 Fatty Acids (FISH OIL) 1000 MG CAPS Take 1,000 mg by mouth daily.   omeprazole (PRILOSEC) 20 MG capsule Take 20 mg by mouth daily.   sertraline (ZOLOFT) 50 MG tablet TAKE ONE TABLET BY MOUTH DAILY   sildenafil (VIAGRA) 100 MG tablet Take 100 mg by mouth daily as needed for erectile dysfunction.   tamsulosin (FLOMAX) 0.4 MG CAPS capsule Take 0.8 mg by mouth in the morning and at bedtime.   zolpidem (AMBIEN) 10 MG tablet Take 10 mg by mouth at bedtime as needed for sleep.      Allergies:   Oxycodone   Social History   Socioeconomic History   Marital status: Married    Spouse  name: Not on file   Number of children: 1   Years of education: Not on file   Highest education level: Some college, no degree  Occupational History   Occupation: Retired  Tobacco Use   Smoking status: Never   Smokeless tobacco: Never  Vaping Use   Vaping Use: Never used  Substance and Sexual Activity   Alcohol use: Yes    Comment: very rare   Drug use: Never  Sexual activity: Yes  Other Topics Concern   Not on file  Social History Narrative   Pt lives in [place] with spouse.  5 yo son is health.  Second son died in car accident.      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 There is no history of Breast cancer, Colon cancer, Pancreatic cancer, or Prostate cancer.  ROS:   Please see the history of present illness.  All other systems are reviewed and otherwise negative.    EKGs/Labs/Other Studies Reviewed:    Studies reviewed are outlined and summarized above. Reports included below if pertinent.  2D echo 07/20/21 IMPRESSIONS     1. Left ventricular ejection fraction, by estimation, is 60 to 65%. The  left ventricle has normal function. The left ventricle has no regional  wall motion abnormalities. There is moderate asymmetric left ventricular  hypertrophy of the basal-septal  segment. Left ventricular diastolic parameters are consistent with Grade I  diastolic dysfunction (impaired relaxation).   2. Right ventricular systolic function is normal. The right ventricular  size is normal.  Tricuspid regurgitation signal is inadequate for assessing  PA pressure.   3. Left atrial size was moderately dilated.   4. The mitral valve is grossly normal. Mild mitral valve regurgitation.   5. The aortic valve is tricuspid. There is moderate calcification of the  aortic valve. Aortic valve regurgitation is mild. Severe aortic valve  stenosis. Aortic valve area, by VTI measures 0.98 cm. Aortic valve mean  gradient measures 44.0 mmHg. Aortic  valve Vmax measures 4.54 m/s. Peak gradient of 82 mmHg. DI is 0.20.   6. The inferior vena cava is normal in size with <50% respiratory  variability, suggesting right atrial pressure of 8 mmHg.   Comparison(s): Changes from prior study are noted. 01/13/2021: LVEF 60-65%,  moderate to severe AS - mean gradient 37 mmHg.   2D Echo 01/13/21   1. Left ventricular ejection fraction, by estimation, is 60 to 65%. The  left ventricle has normal function. The left ventricle has no regional  wall motion abnormalities. There is moderate asymmetric left ventricular  hypertrophy of the basal-septal  segment. Left ventricular diastolic parameters are consistent with Grade  II diastolic dysfunction (pseudonormalization). Elevated left atrial  pressure.   2. Right ventricular systolic function is normal. The right ventricular  size is normal. Tricuspid regurgitation signal is inadequate for assessing  PA pressure.   3. Left atrial size was mildly dilated.   4. Right atrial size was mildly dilated.   5. The mitral valve is normal in structure. Trivial mitral valve  regurgitation.   6. Aortic dilatation noted. There is mild dilatation of the ascending  aorta, measuring 38 mm.   7. The inferior vena cava is normal in size with <50% respiratory  variability, suggesting right atrial pressure of 8 mmHg.   8. The aortic valve is calcified. There is severe calcifcation of the  aortic valve. Aortic valve regurgitation is trivial. Moderate  to severe  aortic valve  stenosis. Vmax 3.9 m/s, MG 37 mmHg, AVA 1.1 cm^2, DI 0.22    Cath 2019 Right dominant coronary anatomy. Normal left main. Normal LAD. Normal circumflex giving origin to 4 obtuse marginals with the second marginal being a large vessel supplying much of the lateral wall. RCA contains eccentric proximal 30% narrowing.  Otherwise widely patent without any significant obstructive disease. Heavily calcified aortic valve with restricted motion.  Transvalvular peak to peak gradient 33 mmHg; mean gradient 30 mmHg. Normal left ventricular LVEDP, 15 mmHg. Atypical episodes of chest pain occurring at rest lasting up to 30 to 40 minutes, characterized as severe and in the left pectoral region.  Suspect neurogenic/musculoskeletal.  Rule out aortic related.   RECOMMENDATIONS:   CT aortic angiography to rule out aneurysm/dissection.  We will set up to be done as an outpatient. Continue aerobic activity. Resume apixaban a.m. 09/20/2018.   No indication for antiplatelet therapy at this time.    EKG:  EKG is ordered today, personally reviewed, demonstrating NSR 77bpm, one PVC, nonspecific TW changes inferiorly similar to prior (see 08/2020)  Recent Labs: 04/01/2021: ALT 25 06/28/2021: BUN 13; Creatinine, Ser 0.74; Hemoglobin 14.3; Platelets 246; Potassium 3.9; Sodium 136  Recent Lipid Panel    Component Value Date/Time   CHOL 137 04/01/2021 1002   TRIG 166 (H) 04/01/2021 1002   HDL 43 04/01/2021 1002   CHOLHDL 3.2 04/01/2021 1002   CHOLHDL 3.6 08/16/2016 0925   VLDL 28 08/16/2016 0925   LDLCALC 66 04/01/2021 1002    PHYSICAL EXAM:    VS:  BP 124/72   Pulse 77   Ht 6' (1.829 m)   Wt 236 lb 6.4 oz (107.2 kg)   SpO2 96%   BMI 32.06 kg/m   BMI: Body mass index is 32.06 kg/m.  GEN: Well nourished, well developed male in no acute distress HEENT: normocephalic, atraumatic Neck: no JVD, carotid bruits, or masses Cardiac: RRR; 3/6 SEM heard over precordium but best over RUSB, S2 not prominent,  no rubs or gallops, no edema  Respiratory:  clear to auscultation bilaterally, normal work of breathing GI: soft, nontender, nondistended, + BS MS: no deformity or atrophy Skin: warm and dry, no rash Neuro:  Alert and Oriented x 3, Strength and sensation are intact, follows commands Psych: euthymic mood, full affect  Wt Readings from Last 3 Encounters:  07/27/21 236 lb 6.4 oz (107.2 kg)  07/05/21 235 lb (106.6 kg)  06/28/21 235 lb 2 oz (106.7 kg)     ASSESSMENT & PLAN:   1. Severe aortic stenosis - per Dr. Thompson Caul request, will set up for [facility] (The patient can do most days but just not Friday when he has his urology appt.) Originally we planned to set up with our structural team members but then spoke with Dr. Tamala Julian who prefers to do the heart cath himself. We will adjust his AVS to reflect this. Jeanann Lewandowsky called the patient to give him the update. Will check pre-cath BMET/CBC today. Instructions will be provided regarding holding his Eliquis and metformin.  Shared Decision Making/Informed Consent The risks [stroke (1 in 1000), death (1 in 1000), kidney failure [usually temporary] (1 in 500), bleeding (1 in 200), allergic reaction [possibly serious] (1 in 200)], benefits (diagnostic support and management of coronary artery disease) and alternatives of a cardiac catheterization were discussed in detail with Mr. Trimnal and he is willing to proceed.  2. Paroxysmal atrial fibrillation - maintaining NSR with rare ectopy.  Continue apixaban except anticipate holding per protocol for cath. Continue with diltiazem. Patient discontinued BB.  3. Mild CAD - continues with rare atypical CP but no progressive anginal symptoms. His coronaries will be evaluated as above. Not on standing ASA due to apixaban and previously only mild disease. LDL at goal by last check. Continue statin.  4. Dilated ascending aorta - this was noted on a prior study but his recent echo 07/2021 showed normal aorta caliber.  CT scans are typically part of the pre-structural workup so I will defer to structural team whether they feel this needs further evaluation in his CT protocol.   5. Pulmonary nodules seen on 2019 CT - I discussed this finding with the patient at last OV and we also added to his AVS to speak with his PCP about this. He does not recall doing so. As above, anticipate he will have CT scans as part of his structural workup but in the event that he is considered for SAVR instead, will need re-look at the plan for these.   6. Hypotension - noted about 1 month ago. Patient self discontinued metoprolol with improvement. Check BMET and CBC as above. Will also get baseline TSH.   Disposition: Will refer to structural team post-cath. Will also keep f/u as previously scheduled with Dr. Tamala Julian as a post-cath follow-up in October in case OV is needed depending on structural team's availability.  Medication Adjustments/Labs and Tests Ordered: Current medicines are reviewed at length with the patient today.  Concerns regarding medicines are outlined above. Medication changes, Labs and Tests ordered today are summarized above and listed in the Patient Instructions accessible in Encounters.   Signed, Charlie Pitter, PA-C  07/27/2021 8:28 AM    [address], [place], [place]  [id number] Phone: [phone]; Fax: [phone]

## 2021-07-23 NOTE — Progress Notes (Signed)
Cardiology Office Note    Date:  07/27/2021   ID:  Jeremy Tucker, DOB Apr 13, 1951, MRN [id number]  PCP:  Denita Lung, MD  Cardiologist:  [name], MD  Electrophysiologist:  [name], MD   Chief Complaint: discuss heart cath and further workup for AS  History of Present Illness:   Jeremy Tucker is a 70 y.o. male with history of progressive aortic stenosis, paroxysmal atrial fib (remote ablation with recurrence), HTN, HLD (managed by PCP), GERD, DM with neuropathy, OSA (did not tolerate CPAP), BPH, remote DVT after knee surgery, hiatal hernia and lung nodules.  He has a prior history of atypical chest pain with cath in 2019 showing minimal coronary atherosclerosis as outlined below (30% RCA), chest pain felt noncardiac. CT at that time did demonstrate 1-43m nodular opacities (no f/u if low risk, 12 month CT if high-risk, do not see repeat since that time). He has previously had ablation with Dr. ARayann Heman Subsequent to this he had palpitations so had ILR implanted remotely in 2016 confirming recurrence of PAF. Some of the episodes were also felt to represent NSR with PACs. Last Linq interrogation was in 12/2018, explanted 02/2021. He has been on anticoagulation. His aortic stenosis has been followed by serial echocardiograms with last surveillance echo 60-65%, moderate asymmetric basal septal hypertrophy, grade 1 DD moderate LAE, mild MR, mild AI, severe AS. He now presents for pre-surgical planning.   He is seen back for follow-up today overall clinically stable. Overall he is doing OK, but retrospectively he has been noticing progressive exertional dyspnea for several months now. He still has rare fleeting chest pains lasting a few seconds a few times a day. No exertional anginal. He stopped his metoprolol about a month ago because he had some dizziness and BP was 89/59 on occasion. His BP has since normalized. He has not had any syncope or edema. He has urology follow-up on Friday to  discuss next steps of his prostate treatment.  Labwork independently reviewed: 06/2021 CBC wnl, K 3.9, Cr 0.74 03/2021 LDL 66, LFTs wnl 2017 TSH wnl   Past Medical History:  Diagnosis Date   Anxiety    Aortic stenosis    BPH (benign prostatic hyperplasia)    BPH without obstruction/lower urinary tract symptoms    Depression    Diabetic peripheral neuropathy ([place] 01/07/2019   ED (erectile dysfunction)    Essential hypertension    GERD (gastroesophageal reflux disease)    Heart murmur    Hiatal hernia    History of chronic bronchitis    per pt used inhaler as needed   History of DVT of lower extremity 2001   s/p  left TKA completed blood thinner treatment,  per pt no previous clot and none since   History of gastric polyp    per pt benign   History of kidney stones    Hyperlipidemia    Mild CAD cardiologist--- dr h. sTamala Julian  nuclear study 04-14-2017 normal , low risk, nuclear ef 53%;  cardiac cath 09-19-2018  midRCA 30% stenosis otherwise normal coronaries,  heavy AV calicification with decreased mobility with peak grandiant 395mg   Mild dilation of ascending aorta ([facility]   per echo 01-13-2021  3863m Nephrolithiasis    OA (osteoarthritis)    OSA (obstructive sleep apnea)    per pt refused cpap, intolerant;  moderate osa per study in epic 03-08-2015   PAF (paroxysmal atrial fibrillation) ([place]  followed by  dr h. 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/2018   Prostate cancer [facility]) urologist--- dr dahlstedt/  oncology-- dr Tammi Klippel   first dx 03/ 2021,  G[place]on 3+4  PSA 6.95   Pulmonary nodule 2019   incidently finding on CTA 09-25-2018, stable   Right carpal tunnel syndrome 01/07/2019   Type 2 diabetes mellitus ([place])    followed by pcp   (03-02-2021  pt stated does not check blood sugar at home)   Umbilical hernia     Past Surgical History:  Procedure Laterality Date   ATRIAL FIBRILLATION ABLATION N/A  03/12/2015   Procedure: ATRIAL FIBRILLATION ABLATION;  Surgeon: [name], MD;  Location: [facility] CATH LAB;  Service: Cardiovascular;  Laterality: N/A;   CARDIAC CATHETERIZATION  1990's   "Dr. Melvern Banker", no blockages per pt   CARDIOVERSION N/A 02/25/2015   Procedure: CARDIOVERSION;  Surgeon: Sueanne Margarita, MD;  Location: [facility];  Service: Cardiovascular;  Laterality: N/A;   CATARACT EXTRACTION W/ INTRAOCULAR LENS  IMPLANT, BILATERAL  2016   COLONOSCOPY  last one 2013  dr Earlean Shawl   CYSTOSCOPY N/A 03/08/2021   Procedure: CYSTOSCOPY BLADDER CALCULI EXTRACTION ;  Surgeon: Franchot Gallo, MD;  Location: [facility];  Service: Urology;  Laterality: N/A;   CYSTOSCOPY WITH RETROGRADE PYELOGRAM, URETEROSCOPY AND STENT PLACEMENT Left 04/24/2015   Procedure: URETHRAL MEATAL DILATION, CYSTOSCOPY WITH LEFT RETROGRADE PYELOGRAM, LEFT URETEROSCOPY, STONE BASKET EXTRACTION,  LEFT DOUBLE J STENT PLACEMENT;  Surgeon: Carolan Clines, MD;  Location: [facility];  Service: Urology;  Laterality: Left;   EP IMPLANTABLE DEVICE N/A 09/15/2015   Procedure: Loop Recorder Insertion;  Surgeon: [name], MD;  Location: [place] [facility] CV LAB;  Service: Cardiovascular;  Laterality: N/A;   ESOPHAGOGASTRODUODENOSCOPY  last one 2017   EXTRACORPOREAL SHOCK WAVE LITHOTRIPSY Bilateral 04/02/2020   Procedure: EXTRACORPOREAL SHOCK WAVE LITHOTRIPSY (ESWL);  Surgeon: Cleon Gustin, MD;  Location: [facility];  Service: Urology;  Laterality: Bilateral;   EXTRACORPOREAL SHOCK WAVE LITHOTRIPSY Left 04/06/2020   Procedure: EXTRACORPOREAL SHOCK WAVE LITHOTRIPSY (ESWL);  Surgeon: [name], MD;  Location[facility];  Service: Urology;  Laterality: Left;   EYE SURGERY     HOLMIUM LASER APPLICATION Left [id number]   Procedure: HOLMIUM LASER APPLICATION;  Surgeon: Carolan Clines, MD;  [facility]: [facility];  Service: Urology;  Laterality: Left;   implantable loopr recorder  removal  02/22/2021   MDT LINQ removed in office by Dr Allred   JOINT REPLACEMENT     KNEE ARTHROSCOPY Bilateral "multiple times"   LEFT HEART CATH AND CORONARY ANGIOGRAPHY N/A 09/19/2018   Procedure: LEFT HEART CATH AND CORONARY ANGIOGRAPHY;  Surgeon: Belva Crome, MD;  Location: [facility] CV LAB;  Service: Cardiovascular;  Laterality: N/A;   PERCUTANEOUS NEPHROSTOLITHOTOMY  1980s and 1990s   RADIOACTIVE SEED IMPLANT N/A 03/08/2021   Procedure: RADIOACTIVE SEED IMPLANT/BRACHYTHERAPY IMPLANT;  Surgeon: Franchot Gallo, MD;  Location: [facility];  Service: Urology;  Laterality: N/A;  90 MINS   SKIN BIOPSY Left 05/06/2020   Shave biopsy left posterior hand neurofiberoma.   SPACE OAR INSTILLATION N/A 03/08/2021   Procedure: SPACE OAR INSTILLATION;  Surgeon: Franchot Gallo, MD;  Location: [facility];  Service: Urology;  Laterality: N/A;   TEE WITHOUT CARDIOVERSION N/A 03/12/2015   Procedure: TRANSESOPHAGEAL ECHOCARDIOGRAM (TEE);  Surgeon: Pixie Casino, MD;  Location: [facility];  Service: Cardiovascular;  Laterality:  N/A;   TONSILLECTOMY  age 50   TOTAL KNEE ARTHROPLASTY Left 11-22-1999  '@[facility]'$    TOTAL KNEE ARTHROPLASTY Right 10/30/2017   Procedure: RIGHT TOTAL KNEE ARTHROPLASTY;  Surgeon: Paralee Cancel, MD;  [facility];  Service: Orthopedics;  Laterality: Right;  90 mins   UMBILICAL HERNIA REPAIR N/A 07/05/2021   Procedure: REPAIR INCARCERATED UMBILICAL HERNIA WITH MESH;  Surgeon: Georganna Skeans, MD;  Location: [facility];  Service: General;  Laterality: N/A;    Current Medications: Current Meds  Medication Sig   acetaminophen (TYLENOL) 500 MG tablet Take 1,000 mg by mouth every 8 (eight) hours as needed for mild pain.    albuterol (VENTOLIN HFA) 108 (90 Base) MCG/ACT inhaler INHALE 2 PUFFS INTO THE LUNGS EVERY 6 HOURS AS NEEDED FOR WHEEZING OR SHORTNESS OF BREATH   allopurinol (ZYLOPRIM) 300 MG tablet Take 1 tablet (300 mg total) by mouth daily.    ammonium lactate (LAC-HYDRIN) 12 % lotion Apply 1 application topically daily as needed for dry skin.   buPROPion (WELLBUTRIN XL) 300 MG 24 hr tablet Take 1 tablet (300 mg total) by mouth daily.   diltiazem (TIAZAC) 180 MG 24 hr capsule Take 1 capsule (180 mg total) by mouth daily.   ELIQUIS 5 MG TABS tablet TAKE ONE TABLET BY MOUTH TWICE A DAY   finasteride (PROSCAR) 5 MG tablet Take 5 mg by mouth daily.   fluticasone (FLONASE) 50 MCG/ACT nasal spray Place 2 sprays into both nostrils daily as needed for allergies.   Fluticasone-Salmeterol (ADVAIR DISKUS) 250-50 MCG/DOSE AEPB Inhale 1 puff into the lungs 2 (two) times daily. (Patient taking differently: Inhale 1 puff into the lungs 2 (two) times daily as needed.)   HYDROcodone-acetaminophen (NORCO/VICODIN) 5-325 MG tablet Take 1 tablet by mouth every 6 (six) hours as needed for moderate pain.   loratadine (CLARITIN) 10 MG tablet Take 10 mg by mouth daily.   LORazepam (ATIVAN) 0.5 MG tablet TAKE 1 TABLET TWICE DAILY AS NEEDED FOR ANXIETY.   metFORMIN (GLUCOPHAGE) 500 MG tablet Take 1 tablet (500 mg total) by mouth 2 (two) times daily with a meal.   Multiple Vitamins-Minerals (MULTIVITAMIN WITH MINERALS) tablet Take 1 tablet by mouth daily.   olopatadine (PATANOL) 0.1 % ophthalmic solution Place 1 drop into both eyes daily as needed for allergies (tearing).    Omega-3 Fatty Acids (FISH OIL) 1000 MG CAPS Take 1,000 mg by mouth daily.   omeprazole (PRILOSEC) 20 MG capsule Take 20 mg by mouth daily.   sertraline (ZOLOFT) 50 MG tablet TAKE ONE TABLET BY MOUTH DAILY   sildenafil (VIAGRA) 100 MG tablet Take 100 mg by mouth daily as needed for erectile dysfunction.   tamsulosin (FLOMAX) 0.4 MG CAPS capsule Take 0.8 mg by mouth in the morning and at bedtime.   zolpidem (AMBIEN) 10 MG tablet Take 10 mg by mouth at bedtime as needed for sleep.      Allergies:   Oxycodone   Social History   Socioeconomic History   Marital status: Married    Spouse  name: Not on file   Number of children: 1   Years of education: Not on file   Highest education level: Some college, no degree  Occupational History   Occupation: Retired  Tobacco Use   Smoking status: Never   Smokeless tobacco: Never  Vaping Use   Vaping Use: Never used  Substance and Sexual Activity   Alcohol use: Yes    Comment: very rare   Drug use: Never  Sexual activity: Yes  Other Topics Concern   Not on file  Social History Narrative   Pt lives in [place] with spouse.  34 yo son is health.  Second son died in car accident.      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 There is no history of Breast cancer, Colon cancer, Pancreatic cancer, or Prostate cancer.  ROS:   Please see the history of present illness.  All other systems are reviewed and otherwise negative.    EKGs/Labs/Other Studies Reviewed:    Studies reviewed are outlined and summarized above. Reports included below if pertinent.  2D echo 07/20/21 IMPRESSIONS     1. Left ventricular ejection fraction, by estimation, is 60 to 65%. The  left ventricle has normal function. The left ventricle has no regional  wall motion abnormalities. There is moderate asymmetric left ventricular  hypertrophy of the basal-septal  segment. Left ventricular diastolic parameters are consistent with Grade I  diastolic dysfunction (impaired relaxation).   2. Right ventricular systolic function is normal. The right ventricular  size is normal.  Tricuspid regurgitation signal is inadequate for assessing  PA pressure.   3. Left atrial size was moderately dilated.   4. The mitral valve is grossly normal. Mild mitral valve regurgitation.   5. The aortic valve is tricuspid. There is moderate calcification of the  aortic valve. Aortic valve regurgitation is mild. Severe aortic valve  stenosis. Aortic valve area, by VTI measures 0.98 cm. Aortic valve mean  gradient measures 44.0 mmHg. Aortic  valve Vmax measures 4.54 m/s. Peak gradient of 82 mmHg. DI is 0.20.   6. The inferior vena cava is normal in size with <50% respiratory  variability, suggesting right atrial pressure of 8 mmHg.   Comparison(s): Changes from prior study are noted. 01/13/2021: LVEF 60-65%,  moderate to severe AS - mean gradient 37 mmHg.   2D Echo 01/13/21   1. Left ventricular ejection fraction, by estimation, is 60 to 65%. The  left ventricle has normal function. The left ventricle has no regional  wall motion abnormalities. There is moderate asymmetric left ventricular  hypertrophy of the basal-septal  segment. Left ventricular diastolic parameters are consistent with Grade  II diastolic dysfunction (pseudonormalization). Elevated left atrial  pressure.   2. Right ventricular systolic function is normal. The right ventricular  size is normal. Tricuspid regurgitation signal is inadequate for assessing  PA pressure.   3. Left atrial size was mildly dilated.   4. Right atrial size was mildly dilated.   5. The mitral valve is normal in structure. Trivial mitral valve  regurgitation.   6. Aortic dilatation noted. There is mild dilatation of the ascending  aorta, measuring 38 mm.   7. The inferior vena cava is normal in size with <50% respiratory  variability, suggesting right atrial pressure of 8 mmHg.   8. The aortic valve is calcified. There is severe calcifcation of the  aortic valve. Aortic valve regurgitation is trivial. Moderate  to severe  aortic valve  stenosis. Vmax 3.9 m/s, MG 37 mmHg, AVA 1.1 cm^2, DI 0.22    Cath 2019 Right dominant coronary anatomy. Normal left main. Normal LAD. Normal circumflex giving origin to 4 obtuse marginals with the second marginal being a large vessel supplying much of the lateral wall. RCA contains eccentric proximal 30% narrowing.  Otherwise widely patent without any significant obstructive disease. Heavily calcified aortic valve with restricted motion.  Transvalvular peak to peak gradient 33 mmHg; mean gradient 30 mmHg. Normal left ventricular LVEDP, 15 mmHg. Atypical episodes of chest pain occurring at rest lasting up to 30 to 40 minutes, characterized as severe and in the left pectoral region.  Suspect neurogenic/musculoskeletal.  Rule out aortic related.   RECOMMENDATIONS:   CT aortic angiography to rule out aneurysm/dissection.  We will set up to be done as an outpatient. Continue aerobic activity. Resume apixaban a.m. 09/20/2018.   No indication for antiplatelet therapy at this time.    EKG:  EKG is ordered today, personally reviewed, demonstrating NSR 77bpm, one PVC, nonspecific TW changes inferiorly similar to prior (see 08/2020)  Recent Labs: 04/01/2021: ALT 25 06/28/2021: BUN 13; Creatinine, Ser 0.74; Hemoglobin 14.3; Platelets 246; Potassium 3.9; Sodium 136  Recent Lipid Panel    Component Value Date/Time   CHOL 137 04/01/2021 1002   TRIG 166 (H) 04/01/2021 1002   HDL 43 04/01/2021 1002   CHOLHDL 3.2 04/01/2021 1002   CHOLHDL 3.6 08/16/2016 0925   VLDL 28 08/16/2016 0925   LDLCALC 66 04/01/2021 1002    PHYSICAL EXAM:    VS:  BP 124/72   Pulse 77   Ht 6' (1.829 m)   Wt 236 lb 6.4 oz (107.2 kg)   SpO2 96%   BMI 32.06 kg/m   BMI: Body mass index is 32.06 kg/m.  GEN: Well nourished, well developed male in no acute distress HEENT: normocephalic, atraumatic Neck: no JVD, carotid bruits, or masses Cardiac: RRR; 3/6 SEM heard over precordium but best over RUSB, S2 not prominent,  no rubs or gallops, no edema  Respiratory:  clear to auscultation bilaterally, normal work of breathing GI: soft, nontender, nondistended, + BS MS: no deformity or atrophy Skin: warm and dry, no rash Neuro:  Alert and Oriented x 3, Strength and sensation are intact, follows commands Psych: euthymic mood, full affect  Wt Readings from Last 3 Encounters:  07/27/21 236 lb 6.4 oz (107.2 kg)  07/05/21 235 lb (106.6 kg)  06/28/21 235 lb 2 oz (106.7 kg)     ASSESSMENT & PLAN:   1. Severe aortic stenosis - per Dr. Thompson Caul request, will set up for [facility]. (The patient can do most days but just not Friday when he has his urology appt.) Originally we planned to set up with our structural team members but then spoke with Dr. Tamala Julian who prefers to do the heart cath himself. We will adjust his AVS to reflect this. Jeanann Lewandowsky called the patient to give him the update. Will check pre-cath BMET/CBC today. Instructions will be provided regarding holding his Eliquis and metformin.  Shared Decision Making/Informed Consent The risks [stroke (1 in 1000), death (1 in 1000), kidney failure [usually temporary] (1 in 500), bleeding (1 in 200), allergic reaction [possibly serious] (1 in 200)], benefits (diagnostic support and management of coronary artery disease) and alternatives of a cardiac catheterization were discussed in detail with Jeremy Tucker and he is willing to proceed.  2. Paroxysmal atrial fibrillation - maintaining NSR with rare ectopy.  Continue apixaban except anticipate holding per protocol for cath. Continue with diltiazem. Patient discontinued BB.  3. Mild CAD - continues with rare atypical CP but no progressive anginal symptoms. His coronaries will be evaluated as above. Not on standing ASA due to apixaban and previously only mild disease. LDL at goal by last check. Continue statin.  4. Dilated ascending aorta - this was noted on a prior study but his recent echo 07/2021 showed normal aorta caliber.  CT scans are typically part of the pre-structural workup so I will defer to structural team whether they feel this needs further evaluation in his CT protocol.   5. Pulmonary nodules seen on 2019 CT - I discussed this finding with the patient at last OV and we also added to his AVS to speak with his PCP about this. He does not recall doing so. As above, anticipate he will have CT scans as part of his structural workup but in the event that he is considered for SAVR instead, will need re-look at the plan for these.   6. Hypotension - noted about 1 month ago. Patient self discontinued metoprolol with improvement. Check BMET and CBC as above. Will also get baseline TSH.   Disposition: Will refer to structural team post-cath. Will also keep f/u as previously scheduled with Dr. Tamala Julian as a post-cath follow-up in October in case OV is needed depending on structural team's availability.  Medication Adjustments/Labs and Tests Ordered: Current medicines are reviewed at length with the patient today.  Concerns regarding medicines are outlined above. Medication changes, Labs and Tests ordered today are summarized above and listed in the Patient Instructions accessible in Encounters.   Signed, Charlie Pitter, PA-C  07/27/2021 8:28 AM    [address], [place], [place]  [address] Phone: [phone]; Fax: [phone]

## 2021-07-26 ENCOUNTER — Telehealth: Payer: Self-pay

## 2021-07-26 MED ORDER — BUPROPION HCL ER (XL) 300 MG PO TB24: 300.0000 mg | ORAL_TABLET | Freq: Every day | ORAL | 0 refills | Status: DC

## 2021-07-26 NOTE — Addendum Note (Signed)
Addended by: Denita Lung on: 07/26/2021 04:45 PM   Modules accepted: Orders

## 2021-07-26 NOTE — Telephone Encounter (Signed)
Received fax from [facility] for a refill on the pts. Bupropion last apt was 10/02/20 and next apt is 08/03/21.

## 2021-07-27 ENCOUNTER — Other Ambulatory Visit: Payer: Self-pay | Admitting: Physician Assistant

## 2021-07-27 ENCOUNTER — Ambulatory Visit: Payer: PPO | Admitting: Physician Assistant

## 2021-07-27 ENCOUNTER — Encounter: Payer: Self-pay | Admitting: Physician Assistant

## 2021-07-27 ENCOUNTER — Other Ambulatory Visit: Payer: Self-pay

## 2021-07-27 VITALS — BP 124/72 | HR 77 | Ht 72.0 in | Wt 236.4 lb

## 2021-07-27 DIAGNOSIS — I2510 Atherosclerotic heart disease of native coronary artery without angina pectoris: Secondary | ICD-10-CM

## 2021-07-27 DIAGNOSIS — I77810 Thoracic aortic ectasia: Secondary | ICD-10-CM

## 2021-07-27 DIAGNOSIS — I959 Hypotension, unspecified: Secondary | ICD-10-CM | POA: Diagnosis not present

## 2021-07-27 DIAGNOSIS — I350 Nonrheumatic aortic (valve) stenosis: Secondary | ICD-10-CM | POA: Diagnosis not present

## 2021-07-27 DIAGNOSIS — I480 Paroxysmal atrial fibrillation: Secondary | ICD-10-CM

## 2021-07-27 DIAGNOSIS — R918 Other nonspecific abnormal finding of lung field: Secondary | ICD-10-CM

## 2021-07-27 LAB — CBC
Hematocrit: 41.1 % (ref 37.5–51.0)
Hemoglobin: 14 g/dL (ref 13.0–17.7)
MCH: 31.1 pg (ref 26.6–33.0)
MCHC: 34.1 g/dL (ref 31.5–35.7)
MCV: 91 fL (ref 79–97)
Platelets: 211 10*3/uL (ref 150–450)
RBC: 4.5 x10E6/uL (ref 4.14–5.80)
RDW: 12.1 % (ref 11.6–15.4)
WBC: 7.8 10*3/uL (ref 3.4–10.8)

## 2021-07-27 LAB — BASIC METABOLIC PANEL
BUN/Creatinine Ratio: 17 (ref 10–24)
BUN: 14 mg/dL (ref 8–27)
CO2: 24 mmol/L (ref 20–29)
Calcium: 9.4 mg/dL (ref 8.6–10.2)
Chloride: 102 mmol/L (ref 96–106)
Creatinine, Ser: 0.82 mg/dL (ref 0.76–1.27)
Glucose: 160 mg/dL — ABNORMAL HIGH (ref 65–99)
Potassium: 3.8 mmol/L (ref 3.5–5.2)
Sodium: 142 mmol/L (ref 134–144)
eGFR: 94 mL/min/{1.73_m2} (ref >59)

## 2021-07-27 LAB — TSH: TSH: 2.28 u[IU]/mL (ref 0.450–4.500)

## 2021-07-27 MED ORDER — SODIUM CHLORIDE 0.9% FLUSH
3.0000 mL | Freq: Two times a day (BID) | INTRAVENOUS | Status: DC
Start: 2021-07-27 — End: 2021-09-21

## 2021-07-27 NOTE — Patient Instructions (Addendum)
Medication Instructions:  Your physician recommends that you continue on your current medications as directed. Please refer to the Current Medication list given to you today.  *If you need a refill on your cardiac medications before your next appointment, please call your pharmacy*   Lab Work: TODAY;  BMET, TSH, & CBC  If you have labs (blood work) drawn today and your tests are completely normal, you will receive your results only by: [facility] (if you have MyChart) OR A paper copy in the mail If you have any lab test that is abnormal or we need to change your treatment, we will call you to review the results.   Testing/Procedures: Your physician has requested that you have a cardiac catheterization. Cardiac catheterization is used to diagnose and/or treat various heart conditions. Doctors may recommend this procedure for a number of different reasons. The most common reason is to evaluate chest pain. Chest pain can be a symptom of coronary artery disease (CAD), and cardiac catheterization can show whether plaque is narrowing or blocking your heart's arteries. This procedure is also used to evaluate the valves, as well as measure the blood flow and oxygen levels in different parts of your heart. For further information please visit [email or web address]. Please follow instruction sheet, BELOW:   Page OFFICE [place], [place] [address] Dept: [phone] Loc: [place]  07/27/2021  You are scheduled for a Cardiac Catheterization on Wednesday September 21 with Dr. Daneen Schick.  1. Please arrive at the [facility] (Main Entrance A) at [facility]: [place], [place] [address] at 8:30 (This time is two hours before your procedure to ensure your preparation). Free valet parking service is available.   Special note: Every effort is made to have  your procedure done on time. Please understand that emergencies sometimes delay scheduled procedures.  2. Diet: Do not eat solid foods after midnight.  The patient may have clear liquids until 5am upon the day of the procedure.  3. Labs: You will need to have blood drawn on TODAY  4. Medication instructions in preparation for your procedure:   Contrast Allergy: No  Stop taking Eliquis (Apixiban) on , Sunday, September 18.   Do not take Diabetes Med Glucophage (Metformin) on the day of the procedure and HOLD 48 HOURS AFTER THE PROCEDURE.  On the morning of your procedure, take your Aspirin and any morning medicines NOT listed above.  You may use sips of water.  5. Plan for one night stay--bring personal belongings. 6. Bring a current list of your medications and current insurance cards. 7. You MUST have a responsible person to drive you home. 8. Someone MUST be with you the first 24 hours after you arrive home or your discharge will be delayed. 9. Please wear clothes that are easy to get on and off and wear slip-on shoes.  Thank you for allowing [place] to care for you!   -- [facility]     You have been referred to [facility]. THEY WILL REACH OUT TO YOU FOR AN APPOINTMENT.    Follow-Up: At [facility], you and your health needs are our priority.  As part of our continuing mission to provide you with exceptional heart care, we have created designated Provider Care Teams.  These Care Teams include your primary Cardiologist (physician) and Advanced Practice Providers (APPs -  Physician  Assistants and Nurse Practitioners) who all work together to provide you with the care you need, when you need it.  We recommend signing up for the patient portal called "MyChart".  Sign up information is provided on this After Visit Summary.  MyChart is used to connect with patients for Virtual Visits (Telemedicine).  Patients are able to view lab/test results,  encounter notes, upcoming appointments, etc.  Non-urgent messages can be sent to your provider as well.   To learn more about what you can do with MyChart, go to [email or web address].    Your next appointment:   08/27/2021 AS SCHEDULED   The format for your next appointment:   In Person  Provider:   You may see [name], MD or one of the following Advanced Practice Providers on your designated Care Team:   Cecilie Kicks, NP    Other Instructions

## 2021-07-28 ENCOUNTER — Ambulatory Visit (HOSPITAL_BASED_OUTPATIENT_CLINIC_OR_DEPARTMENT_OTHER): Admit: 2021-07-28 | Payer: PPO | Admitting: Otolaryngology

## 2021-07-28 ENCOUNTER — Encounter (HOSPITAL_BASED_OUTPATIENT_CLINIC_OR_DEPARTMENT_OTHER): Payer: Self-pay

## 2021-07-28 SURGERY — SURGERY, PARANASAL SINUS, ENDOSCOPIC, WITH NASAL SEPTOPLASTY, TURBINOPLASTY, AND MAXILLARY SINUSOTOMY
Anesthesia: General | Laterality: Bilateral

## 2021-07-30 DIAGNOSIS — L02215 Cutaneous abscess of perineum: Secondary | ICD-10-CM | POA: Diagnosis not present

## 2021-08-03 ENCOUNTER — Telehealth: Payer: Self-pay | Admitting: *Deleted

## 2021-08-03 ENCOUNTER — Ambulatory Visit (INDEPENDENT_AMBULATORY_CARE_PROVIDER_SITE_OTHER): Payer: PPO | Admitting: Family Medicine

## 2021-08-03 ENCOUNTER — Other Ambulatory Visit: Payer: Self-pay

## 2021-08-03 ENCOUNTER — Encounter: Payer: Self-pay | Admitting: Family Medicine

## 2021-08-03 VITALS — BP 112/78 | HR 88 | Temp 97.5°F | Ht 72.0 in | Wt 235.4 lb

## 2021-08-03 DIAGNOSIS — J4531 Mild persistent asthma with (acute) exacerbation: Secondary | ICD-10-CM | POA: Diagnosis not present

## 2021-08-03 DIAGNOSIS — N529 Male erectile dysfunction, unspecified: Secondary | ICD-10-CM | POA: Diagnosis not present

## 2021-08-03 DIAGNOSIS — K219 Gastro-esophageal reflux disease without esophagitis: Secondary | ICD-10-CM | POA: Diagnosis not present

## 2021-08-03 DIAGNOSIS — I350 Nonrheumatic aortic (valve) stenosis: Secondary | ICD-10-CM | POA: Diagnosis not present

## 2021-08-03 DIAGNOSIS — Z23 Encounter for immunization: Secondary | ICD-10-CM | POA: Diagnosis not present

## 2021-08-03 DIAGNOSIS — C61 Malignant neoplasm of prostate: Secondary | ICD-10-CM

## 2021-08-03 DIAGNOSIS — E1159 Type 2 diabetes mellitus with other circulatory complications: Secondary | ICD-10-CM | POA: Diagnosis not present

## 2021-08-03 DIAGNOSIS — E1169 Type 2 diabetes mellitus with other specified complication: Secondary | ICD-10-CM

## 2021-08-03 DIAGNOSIS — I480 Paroxysmal atrial fibrillation: Secondary | ICD-10-CM

## 2021-08-03 DIAGNOSIS — E669 Obesity, unspecified: Secondary | ICD-10-CM

## 2021-08-03 DIAGNOSIS — E1142 Type 2 diabetes mellitus with diabetic polyneuropathy: Secondary | ICD-10-CM

## 2021-08-03 DIAGNOSIS — Z7901 Long term (current) use of anticoagulants: Secondary | ICD-10-CM

## 2021-08-03 DIAGNOSIS — E785 Hyperlipidemia, unspecified: Secondary | ICD-10-CM

## 2021-08-03 DIAGNOSIS — F32A Depression, unspecified: Secondary | ICD-10-CM

## 2021-08-03 DIAGNOSIS — F419 Anxiety disorder, unspecified: Secondary | ICD-10-CM | POA: Diagnosis not present

## 2021-08-03 DIAGNOSIS — Z87442 Personal history of urinary calculi: Secondary | ICD-10-CM | POA: Diagnosis not present

## 2021-08-03 DIAGNOSIS — J342 Deviated nasal septum: Secondary | ICD-10-CM

## 2021-08-03 DIAGNOSIS — Z0000 Encounter for general adult medical examination without abnormal findings: Secondary | ICD-10-CM

## 2021-08-03 DIAGNOSIS — Z96651 Presence of right artificial knee joint: Secondary | ICD-10-CM

## 2021-08-03 DIAGNOSIS — Z86010 Personal history of colonic polyps: Secondary | ICD-10-CM

## 2021-08-03 DIAGNOSIS — E118 Type 2 diabetes mellitus with unspecified complications: Secondary | ICD-10-CM | POA: Diagnosis not present

## 2021-08-03 DIAGNOSIS — Z860101 Personal history of adenomatous and serrated colon polyps: Secondary | ICD-10-CM

## 2021-08-03 DIAGNOSIS — I152 Hypertension secondary to endocrine disorders: Secondary | ICD-10-CM

## 2021-08-03 LAB — POCT UA - MICROALBUMIN
Albumin/Creatinine Ratio, Urine, POC: 42.8
Creatinine, POC: 129.8 mg/dL
Microalbumin Ur, POC: 55.5 mg/L

## 2021-08-03 MED ORDER — METFORMIN HCL 500 MG PO TABS: 500.0000 mg | ORAL_TABLET | Freq: Two times a day (BID) | ORAL | 3 refills | Status: DC

## 2021-08-03 MED ORDER — ATORVASTATIN CALCIUM 40 MG PO TABS: 40.0000 mg | ORAL_TABLET | Freq: Every day | ORAL | 3 refills | Status: DC

## 2021-08-03 MED ORDER — ALLOPURINOL 300 MG PO TABS: 300.0000 mg | ORAL_TABLET | Freq: Every day | ORAL | 3 refills | Status: DC

## 2021-08-03 MED ORDER — SERTRALINE HCL 50 MG PO TABS: 50.0000 mg | ORAL_TABLET | Freq: Every day | ORAL | 1 refills | Status: DC

## 2021-08-03 MED ORDER — ZOLPIDEM TARTRATE 5 MG PO TABS: 5.0000 mg | ORAL_TABLET | Freq: Every evening | ORAL | 1 refills | Status: DC | PRN

## 2021-08-03 MED ORDER — BUPROPION HCL ER (XL) 300 MG PO TB24: 300.0000 mg | ORAL_TABLET | Freq: Every day | ORAL | 1 refills | Status: DC

## 2021-08-03 MED ORDER — SILDENAFIL CITRATE 100 MG PO TABS: 100.0000 mg | ORAL_TABLET | Freq: Every day | ORAL | 1 refills | Status: DC | PRN

## 2021-08-03 NOTE — Patient Instructions (Signed)
  [name] , Thank you for taking time to come for your Medicare Wellness Visit. I appreciate your ongoing commitment to your health goals. Please review the following plan we discussed and let me know if I can assist you in the future.   These are the goals we discussed:  Goals   None     This is a list of the screening recommended for you and due dates:  Health Maintenance  Topic Date Due   Zoster (Shingles) Vaccine (2 of 2) 11/02/2018   COVID-19 Vaccine (4 - Booster for Pfizer series) 12/25/2020   Urine Protein Check  01/27/2021   Flu Shot  06/14/2021   Hemoglobin A1C  12/29/2021   Eye exam for diabetics  07/20/2022   Complete foot exam   08/03/2022   Colon Cancer Screening  12/23/2025   Tetanus Vaccine  09/07/2028   Hepatitis C Screening: USPSTF Recommendation to screen - Ages 18-79 yo.  Completed   HPV Vaccine  Aged Out

## 2021-08-03 NOTE — Progress Notes (Signed)
Jeremy Tucker is a 70 y.o. male who presents for annual wellness visit,CPE and follow-up on chronic medical conditions.  He has nasal surgery scheduled soon for polyps.  He has stopped his Eliquis in preparation for that.  He continues on Lipitor and is having no difficulty with that.  Continues on metformin.  He is exercising and his weight seems to be stable.  He does have a history of asthma but is taking the Advair on an as-needed basis and has not been using the albuterol.  He would like a refill on his Viagra.  He follows up regularly with cardiology for his underlying A. fib as well as aortic stenosis.  Presently he is taking diltiazem and is not on an ACE inhibitor.  He does have a history of renal stones and has been on allopurinol to help with that for years.  He seems to be fairly stable on that.  Continues on Prilosec for his reflux disease and is seeing Dr. Thana Farr about that.  Psychologically he seems to doing well on Wellbutrin which makes him calm.  He will also continue on sertraline.  He rarely uses Ativan.  He states that he only uses 1 Ambien monthly.   Immunizations and Health Maintenance Immunization History  Administered Date(s) Administered   Fluad Quad(high Dose 65+) 10/02/2020   PFIZER(Purple Top)SARS-COV-2 Vaccination 12/19/2019, 01/09/2020, 10/02/2020   Pneumococcal Conjugate-13 08/16/2016   Pneumococcal Polysaccharide-23 02/19/2018   Tdap 05/31/2006, 09/07/2018   Zoster Recombinat (Shingrix) 09/07/2018   Zoster, Live 04/23/2012   Health Maintenance Due  Topic Date Due   Zoster Vaccines- Shingrix (2 of 2) 11/02/2018   COVID-19 Vaccine (4 - Booster for Pfizer series) 12/25/2020   URINE MICROALBUMIN  01/27/2021   INFLUENZA VACCINE  06/14/2021    Last colonoscopy: 12/24/2015 Last PSA: 10/19/20 last at urology 8/22  level 0.18 per pt Dentist: Q six months Ophtho: Q year Exercise: N/A  Other doctors caring for patient include: Dr. Tamala Julian cardiology, Dr. Wilburn Cornelia ENT,  Dr. Diona Fanti urology, Dr. Tammi Klippel oncology  Advanced Directives: Does Patient Have a Medical Advance Directive?: Yes Type of Advance Directive: Living will, Healthcare Power of Attorney, Out of facility DNR (pink MOST or yellow form) Copy of [place] in Chart?: No - copy requested  Depression screen:  See questionnaire below.     Depression screen [facility] 2/9 08/03/2021 03/04/2021 11/19/2018  Decreased Interest 0 0 3  Down, Depressed, Hopeless 0 0 0  PHQ - 2 Score 0 0 3  Altered sleeping - - 0  Tired, decreased energy - - 1  Change in appetite - - 0  Feeling bad or failure about yourself  - - 0  Trouble concentrating - - 0  Moving slowly or fidgety/restless - - 0  Suicidal thoughts - - 0  PHQ-9 Score - - 4  Difficult doing work/chores - - Not difficult at all  Some recent data might be hidden    Fall Screen: See Questionaire below.   Fall Risk  08/03/2021 03/04/2021 08/14/2017  Falls in the past year? 0 0 No  Number falls in past yr: 0 0 -  Injury with Fall? 0 0 -  Risk for fall due to : No Fall Risks No Fall Risks -  Follow up Falls evaluation completed Falls evaluation completed -    ADL screen:  See questionnaire below.  Functional Status Survey: Is the patient deaf or have difficulty hearing?: No Does the patient have difficulty seeing, even when wearing  glasses/contacts?: No Does the patient have difficulty concentrating, remembering, or making decisions?: No Does the patient have difficulty walking or climbing stairs?: Yes (other) Does the patient have difficulty dressing or bathing?: No Does the patient have difficulty doing errands alone such as visiting a doctor's office or shopping?: No   Review of Systems  Constitutional: -, -unexpected weight change, -anorexia, -fatigue Allergy: -sneezing, -itching, -congestion Dermatology: denies changing moles, rash, lumps ENT: -runny nose, -ear pain, -sore throat,  Cardiology:  -chest pain, -palpitations,  -orthopnea, Respiratory: -cough, -shortness of breath, -dyspnea on exertion, -wheezing,  Gastroenterology: -abdominal pain, -nausea, -vomiting, -diarrhea, -constipation, -dysphagia Hematology: -bleeding or bruising problems Musculoskeletal: -arthralgias, -myalgias, -joint swelling, -back pain, - Ophthalmology: -vision changes,  Urology: -dysuria, -difficulty urinating,  -urinary frequency, -urgency, incontinence Neurology: -, -numbness, , -memory loss, -falls, -dizziness    PHYSICAL EXAM:  General Appearance: Alert, cooperative, no distress, appears stated age Head: Normocephalic, without obvious abnormality, atraumatic Eyes: PERRL, conjunctiva/corneas clear, EOM's intact, Ears: Normal TM's and external ear canals Nose: Nares normal, mucosa normal, no drainage or sinus   tenderness Throat: Lips, mucosa, and tongue normal; teeth and gums normal Neck: Supple, no lymphadenopathy, thyroid:no enlargement/tenderness/nodules; no carotid bruit or JVD Lungs: Clear to auscultation bilaterally without wheezes, rales or ronchi; respirations unlabored Heart: Regular rate and rhythm, S1 and S2 normal, 3/6 SEM no rub or gallop Abdomen: Soft, non-tender, nondistended, normoactive bowel sounds, no masses, no hepatosplenomegaly Extremities: No clubbing, cyanosis or edema.  Diabetic foot exam shows normal pulses and sensation.  No lesions noted. Pulses: 2+ and symmetric all extremities Skin: Skin color, texture, turgor normal, no rashes or lesions Lymph nodes: Cervical, supraclavicular, and axillary nodes normal Neurologic: CNII-XII intact, normal strength, sensation and gait; reflexes 2+ and symmetric throughout   Psych: Normal mood, affect, hygiene and grooming Recent A1c of 6.9. ASSESSMENT/PLAN: Routine general medical examination at a health care facility  Need for influenza vaccination - Plan: Flu Vaccine QUAD High Dose(Fluad)  Hyperlipidemia associated with type 2 diabetes mellitus ([place]) -  Plan: atorvastatin (LIPITOR) 40 MG tablet  Controlled type 2 diabetes mellitus with complication, without long-term current use of insulin (HCC) - Plan: POCT UA - Microalbumin, metFORMIN (GLUCOPHAGE) 500 MG tablet  Anxiety and depression - Plan: buPROPion (WELLBUTRIN XL) 300 MG 24 hr tablet, zolpidem (AMBIEN) 5 MG tablet, sertraline (ZOLOFT) 50 MG tablet  History of renal stone - Plan: allopurinol (ZYLOPRIM) 300 MG tablet  Obesity (BMI 30-39.9)  Mild persistent asthma with acute exacerbation  Erectile dysfunction, unspecified erectile dysfunction type - Plan: sildenafil (VIAGRA) 100 MG tablet  Gastroesophageal reflux disease without esophagitis  Paroxysmal atrial fibrillation (HCC)  Aortic stenosis, moderate  Hypertension associated with diabetes ([place])  Diabetic peripheral neuropathy (HCC)  Malignant neoplasm of prostate (HCC)  Deviated septum  History of adenomatous polyp of colon  Status post total right knee replacement  Chronic anticoagulation He will continue on his present medication regimen and follow-up with ENT, cardiology and neurology.  He is scheduled for follow-up colonoscopy.  Did recommend that he stop Advair since he is not using it.  Discussed proper use of albuterol in terms of twice per week or twice per month at night and call me if there is questions about that.  He will work with his ENT concerning being able to breathe appropriately through his nose.  Did recommend that he cut back on Prilosec and see if he can take it every other day to control his reflux symptoms.  He has had multiple joints  replaced but seems to be doing quite nicely.   recommended at least 30 minutes of aerobic activity at least 5 days/week; proper sunscreen use reviewed; healthy diet and alcohol recommendations (less than or equal to 2 drinks/day) reviewed; regular seatbelt use; changing batteries in smoke detectors. Immunization recommendations discussed.  Colonoscopy recommendations  reviewed.   Medicare Attestation I have personally reviewed: The patient's medical and social history Their use of alcohol, tobacco or illicit drugs Their current medications and supplements The patient's functional ability including ADLs,fall risks, home safety risks, cognitive, and hearing and visual impairment Diet and physical activities Evidence for depression or mood disorders  The patient's weight, height, and BMI have been recorded in the chart.  I have made referrals, counseling, and provided education to the patient based on review of the above and I have provided the patient with a written personalized care plan for preventive services.     Jill Alexanders, MD   08/03/2021

## 2021-08-03 NOTE — Telephone Encounter (Signed)
Cardiac catheterization scheduled at [facility] for: Wednesday August 04, 2021 11:30 [place]) at: 9:30 AM   No solid food after midnight prior to cath, clear liquids until 5 AM day of procedure.  Medication instructions: Hold: Eliquis-none 08/02/21 until post procedure Metformin-day of procedure and 48 hours post procedure  Except hold medications usual morning medications can be taken pre-cath with sips of water including aspirin 81 mg. Patient reports he does not have aspirin 81 mg at home, does not want to buy bottle of aspirin, knows it will be given to him at the hospital.    Confirmed patient has responsible adult to drive home post procedure and be with patient first 24 hours after arriving home.  Patients are allowed one support  person with them. That person may remain in the waiting area during the procedure and may rotate out with other people. Both patient and support person must wear a mask once they enter the hospital.    Reviewed procedure/mask/visitor instructions with patient.

## 2021-08-04 ENCOUNTER — Other Ambulatory Visit: Payer: Self-pay

## 2021-08-04 ENCOUNTER — Encounter (HOSPITAL_COMMUNITY)
Admission: RE | Disposition: A | Discharge status: Home / Self Care | Payer: Self-pay | Source: Home / Self Care | Attending: Internal Medicine

## 2021-08-04 ENCOUNTER — Ambulatory Visit (HOSPITAL_COMMUNITY)
Admission: RE | Admit: 2021-08-04 | Discharge: 2021-08-04 | Disposition: A | Discharge status: Home / Self Care | Payer: PPO | Attending: Internal Medicine | Admitting: Internal Medicine

## 2021-08-04 DIAGNOSIS — Z7984 Long term (current) use of oral hypoglycemic drugs: Secondary | ICD-10-CM | POA: Insufficient documentation

## 2021-08-04 DIAGNOSIS — I10 Essential (primary) hypertension: Secondary | ICD-10-CM | POA: Diagnosis not present

## 2021-08-04 DIAGNOSIS — K219 Gastro-esophageal reflux disease without esophagitis: Secondary | ICD-10-CM | POA: Diagnosis not present

## 2021-08-04 DIAGNOSIS — Z7901 Long term (current) use of anticoagulants: Secondary | ICD-10-CM | POA: Insufficient documentation

## 2021-08-04 DIAGNOSIS — I083 Combined rheumatic disorders of mitral, aortic and tricuspid valves: Secondary | ICD-10-CM | POA: Insufficient documentation

## 2021-08-04 DIAGNOSIS — I480 Paroxysmal atrial fibrillation: Secondary | ICD-10-CM | POA: Diagnosis present

## 2021-08-04 DIAGNOSIS — Z7951 Long term (current) use of inhaled steroids: Secondary | ICD-10-CM | POA: Insufficient documentation

## 2021-08-04 DIAGNOSIS — I350 Nonrheumatic aortic (valve) stenosis: Secondary | ICD-10-CM

## 2021-08-04 DIAGNOSIS — Z79899 Other long term (current) drug therapy: Secondary | ICD-10-CM | POA: Diagnosis not present

## 2021-08-04 DIAGNOSIS — E118 Type 2 diabetes mellitus with unspecified complications: Secondary | ICD-10-CM | POA: Diagnosis present

## 2021-08-04 DIAGNOSIS — E1140 Type 2 diabetes mellitus with diabetic neuropathy, unspecified: Secondary | ICD-10-CM | POA: Diagnosis not present

## 2021-08-04 DIAGNOSIS — R918 Other nonspecific abnormal finding of lung field: Secondary | ICD-10-CM | POA: Insufficient documentation

## 2021-08-04 DIAGNOSIS — I152 Hypertension secondary to endocrine disorders: Secondary | ICD-10-CM | POA: Diagnosis present

## 2021-08-04 DIAGNOSIS — I2510 Atherosclerotic heart disease of native coronary artery without angina pectoris: Secondary | ICD-10-CM | POA: Diagnosis not present

## 2021-08-04 DIAGNOSIS — Z8249 Family history of ischemic heart disease and other diseases of the circulatory system: Secondary | ICD-10-CM | POA: Diagnosis not present

## 2021-08-04 DIAGNOSIS — E785 Hyperlipidemia, unspecified: Secondary | ICD-10-CM | POA: Insufficient documentation

## 2021-08-04 DIAGNOSIS — E1159 Type 2 diabetes mellitus with other circulatory complications: Secondary | ICD-10-CM | POA: Diagnosis present

## 2021-08-04 DIAGNOSIS — G4733 Obstructive sleep apnea (adult) (pediatric): Secondary | ICD-10-CM | POA: Insufficient documentation

## 2021-08-04 DIAGNOSIS — Z885 Allergy status to narcotic agent status: Secondary | ICD-10-CM | POA: Diagnosis not present

## 2021-08-04 DIAGNOSIS — J339 Nasal polyp, unspecified: Secondary | ICD-10-CM | POA: Diagnosis present

## 2021-08-04 DIAGNOSIS — I5189 Other ill-defined heart diseases: Secondary | ICD-10-CM

## 2021-08-04 HISTORY — PX: RIGHT/LEFT HEART CATH AND CORONARY ANGIOGRAPHY: CATH118266

## 2021-08-04 LAB — POCT I-STAT EG7
Acid-Base Excess: 4 mmol/L — ABNORMAL HIGH (ref 0.0–2.0)
Bicarbonate: 30.5 mmol/L — ABNORMAL HIGH (ref 20.0–28.0)
Calcium, Ion: 1.18 mmol/L (ref 1.15–1.40)
HCT: 38 % — ABNORMAL LOW (ref 39.0–52.0)
Hemoglobin: 12.9 g/dL — ABNORMAL LOW (ref 13.0–17.0)
O2 Saturation: 71 %
Potassium: 4 mmol/L (ref 3.5–5.1)
Sodium: 139 mmol/L (ref 135–145)
TCO2: 32 mmol/L (ref 22–32)
pCO2, Ven: 50.9 mmHg (ref 44.0–60.0)
pH, Ven: 7.387 (ref 7.250–7.430)
pO2, Ven: 39 mmHg (ref 32.0–45.0)

## 2021-08-04 LAB — POCT I-STAT 7, (LYTES, BLD GAS, ICA,H+H)
Acid-Base Excess: 3 mmol/L — ABNORMAL HIGH (ref 0.0–2.0)
Bicarbonate: 29.2 mmol/L — ABNORMAL HIGH (ref 20.0–28.0)
Calcium, Ion: 1.19 mmol/L (ref 1.15–1.40)
HCT: 38 % — ABNORMAL LOW (ref 39.0–52.0)
Hemoglobin: 12.9 g/dL — ABNORMAL LOW (ref 13.0–17.0)
O2 Saturation: 98 %
Potassium: 4 mmol/L (ref 3.5–5.1)
Sodium: 139 mmol/L (ref 135–145)
TCO2: 31 mmol/L (ref 22–32)
pCO2 arterial: 47.6 mmHg (ref 32.0–48.0)
pH, Arterial: 7.396 (ref 7.350–7.450)
pO2, Arterial: 98 mmHg (ref 83.0–108.0)

## 2021-08-04 LAB — GLUCOSE, CAPILLARY: Glucose-Capillary: 157 mg/dL — ABNORMAL HIGH (ref 70–99)

## 2021-08-04 SURGERY — RIGHT/LEFT HEART CATH AND CORONARY ANGIOGRAPHY
Anesthesia: LOCAL

## 2021-08-04 MED ORDER — SODIUM CHLORIDE 0.9 % IV SOLN: INTRAVENOUS | Status: DC

## 2021-08-04 MED ORDER — ONDANSETRON HCL 4 MG/2ML IJ SOLN: 4.0000 mg | Freq: Four times a day (QID) | INTRAMUSCULAR | Status: DC | PRN

## 2021-08-04 MED ORDER — SODIUM CHLORIDE 0.9 % WEIGHT BASED INFUSION: 1.0000 mL/kg/h | INTRAVENOUS | Status: DC

## 2021-08-04 MED ORDER — SODIUM CHLORIDE 0.9% FLUSH: 3.0000 mL | Freq: Two times a day (BID) | INTRAVENOUS | Status: DC

## 2021-08-04 MED ORDER — HEPARIN SODIUM (PORCINE) 1000 UNIT/ML IJ SOLN
INTRAMUSCULAR | Status: AC
  Filled 2021-08-04: qty 1

## 2021-08-04 MED ORDER — HYDRALAZINE HCL 20 MG/ML IJ SOLN: 10.0000 mg | INTRAMUSCULAR | Status: DC | PRN

## 2021-08-04 MED ORDER — HEPARIN (PORCINE) IN NACL 2000-0.9 UNIT/L-% IV SOLN
INTRAVENOUS | Status: DC | PRN
  Administered 2021-08-04: 1000 mL

## 2021-08-04 MED ORDER — SODIUM CHLORIDE 0.9 % WEIGHT BASED INFUSION
3.0000 mL/kg/h | INTRAVENOUS | Status: AC
  Administered 2021-08-04: 3 mL/kg/h via INTRAVENOUS

## 2021-08-04 MED ORDER — LIDOCAINE HCL (PF) 1 % IJ SOLN
INTRAMUSCULAR | Status: AC
  Filled 2021-08-04: qty 30

## 2021-08-04 MED ORDER — HEPARIN SODIUM (PORCINE) 1000 UNIT/ML IJ SOLN
INTRAMUSCULAR | Status: DC | PRN
  Administered 2021-08-04: 5000 [IU] via INTRAVENOUS

## 2021-08-04 MED ORDER — VERAPAMIL HCL 2.5 MG/ML IV SOLN
INTRAVENOUS | Status: DC | PRN
  Administered 2021-08-04: 10 mL via INTRA_ARTERIAL

## 2021-08-04 MED ORDER — SODIUM CHLORIDE 0.9% FLUSH: 3.0000 mL | INTRAVENOUS | Status: DC | PRN

## 2021-08-04 MED ORDER — FENTANYL CITRATE (PF) 100 MCG/2ML IJ SOLN
INTRAMUSCULAR | Status: AC
  Filled 2021-08-04: qty 2

## 2021-08-04 MED ORDER — ACETAMINOPHEN 325 MG PO TABS: 650.0000 mg | ORAL_TABLET | ORAL | Status: DC | PRN

## 2021-08-04 MED ORDER — VERAPAMIL HCL 2.5 MG/ML IV SOLN
INTRAVENOUS | Status: AC
  Filled 2021-08-04: qty 2

## 2021-08-04 MED ORDER — LIDOCAINE HCL (PF) 1 % IJ SOLN
INTRAMUSCULAR | Status: DC | PRN
  Administered 2021-08-04: 4 mL

## 2021-08-04 MED ORDER — MIDAZOLAM HCL 2 MG/2ML IJ SOLN
INTRAMUSCULAR | Status: AC
  Filled 2021-08-04: qty 2

## 2021-08-04 MED ORDER — SODIUM CHLORIDE 0.9 % IV SOLN: 250.0000 mL | INTRAVENOUS | Status: DC | PRN

## 2021-08-04 MED ORDER — IOHEXOL 350 MG/ML SOLN
INTRAVENOUS | Status: DC | PRN
  Administered 2021-08-04: 65 mL

## 2021-08-04 MED ORDER — ASPIRIN 81 MG PO CHEW
81.0000 mg | CHEWABLE_TABLET | ORAL | Status: AC
Start: 2021-08-04 — End: 2021-08-04
  Administered 2021-08-04: 81 mg via ORAL
  Filled 2021-08-04: qty 1

## 2021-08-04 MED ORDER — FENTANYL CITRATE (PF) 100 MCG/2ML IJ SOLN
INTRAMUSCULAR | Status: DC | PRN
  Administered 2021-08-04 (×2): 25 ug via INTRAVENOUS

## 2021-08-04 MED ORDER — MIDAZOLAM HCL 2 MG/2ML IJ SOLN
INTRAMUSCULAR | Status: DC | PRN
  Administered 2021-08-04: .5 mg via INTRAVENOUS
  Administered 2021-08-04: 1 mg via INTRAVENOUS

## 2021-08-04 MED ORDER — LABETALOL HCL 5 MG/ML IV SOLN: 10.0000 mg | INTRAVENOUS | Status: DC | PRN

## 2021-08-04 SURGICAL SUPPLY — 11 items
CATH 5FR JL3.5 JR4 ANG PIG MP (CATHETERS) ×2 IMPLANT
CATH BALLN WEDGE 5F 110CM (CATHETERS) ×2 IMPLANT
DEVICE RAD COMP TR BAND LRG (VASCULAR PRODUCTS) ×2 IMPLANT
GLIDESHEATH SLEND A-KIT 6F 22G (SHEATH) ×2 IMPLANT
GUIDEWIRE INQWIRE 1.5J.035X260 (WIRE) ×1 IMPLANT
INQWIRE 1.5J .035X260CM (WIRE) ×2
KIT HEART LEFT (KITS) ×2 IMPLANT
PACK CARDIAC CATHETERIZATION (CUSTOM PROCEDURE TRAY) ×2 IMPLANT
SHEATH GLIDE SLENDER 4/5FR (SHEATH) ×2 IMPLANT
TRANSDUCER W/STOPCOCK (MISCELLANEOUS) ×2 IMPLANT
TUBING CIL FLEX 10 FLL-RA (TUBING) ×2 IMPLANT

## 2021-08-04 NOTE — Interval H&P Note (Signed)
Cath Lab Visit (complete for each Cath Lab visit)  Clinical Evaluation Leading to the Procedure:   ACS: No.  Non-ACS:    Anginal Classification: No Symptoms  Anti-ischemic medical therapy: Minimal Therapy (1 class of medications)  Non-Invasive Test Results: No non-invasive testing performed  Prior CABG: No previous CABG      History and Physical Interval Note:  08/04/2021 12:25 PM  Jeremy Tucker  has presented today for surgery, with the diagnosis of aortic stenosis.  The various methods of treatment have been discussed with the patient and family. After consideration of risks, benefits and other options for treatment, the patient has consented to  Procedure(s): RIGHT/LEFT HEART CATH AND CORONARY ANGIOGRAPHY (N/A) as a surgical intervention.  The patient's history has been reviewed, patient examined, no change in status, stable for surgery.  I have reviewed the patient's chart and labs.  Questions were answered to the patient's satisfaction.     Jeremy Tucker

## 2021-08-04 NOTE — CV Procedure (Signed)
40 to 50% proximal RCA.  Right dominant anatomy. 30% mid LAD. Normal pulmonary artery pressures. Did not attempt to cross the aortic valve.  Known severe by echo.  Mean gradient 44 mmHg. Symptomatic with exertional lightheadedness and dizziness, and exertional dyspnea. Plan aortic valve clinic to consider TAVR versus SAVR.

## 2021-08-05 ENCOUNTER — Ambulatory Visit: Payer: PPO | Admitting: Interventional Cardiology

## 2021-08-05 ENCOUNTER — Encounter (HOSPITAL_COMMUNITY): Payer: Self-pay | Admitting: Interventional Cardiology

## 2021-08-06 ENCOUNTER — Other Ambulatory Visit: Payer: Self-pay

## 2021-08-06 DIAGNOSIS — I350 Nonrheumatic aortic (valve) stenosis: Secondary | ICD-10-CM

## 2021-08-06 MED ORDER — METOPROLOL TARTRATE 50 MG PO TABS: ORAL_TABLET | ORAL | 0 refills | Status: DC

## 2021-08-10 ENCOUNTER — Ambulatory Visit: Payer: PPO | Admitting: Cardiovascular Disease

## 2021-08-10 ENCOUNTER — Other Ambulatory Visit: Payer: Self-pay

## 2021-08-10 ENCOUNTER — Encounter: Payer: Self-pay | Admitting: Cardiovascular Disease

## 2021-08-10 VITALS — BP 140/84 | HR 82 | Ht 73.0 in | Wt 233.6 lb

## 2021-08-10 DIAGNOSIS — I350 Nonrheumatic aortic (valve) stenosis: Secondary | ICD-10-CM

## 2021-08-10 NOTE — Patient Instructions (Signed)
Medication Instructions:   Your physician recommends that you continue on your current medications as directed. Please refer to the Current Medication list given to you today.  *If you need a refill on your cardiac medications before your next appointment, please call your pharmacy*    Follow-Up:  Our structural team will be in contact with you about further appointments/follow-ups/ testing.   Please follow all TAVR testing instructions/letter as provided to you today.

## 2021-08-10 NOTE — Progress Notes (Signed)
Cardiology Office Note:    Date:  08/10/2021   ID:  AMIER HOYT, DOB April 18, 1951, MRN [id number]  PCP:  Denita Lung, MD   [facility] Providers Cardiologist:  [name], MD Electrophysiologist:  [name], MD     Referring MD: Denita Lung, MD   Chief Complaint  Patient presents with   Aortic Stenosis    History of Present Illness:    Jeremy Tucker is a 70 y.o. male presenting for evaluation of aortic stenosis, referred by Dr. Tamala Julian.  Patient's medical problems include paroxysmal atrial fibrillation, hypertension, mixed hyperlipidemia, type 2 diabetes with neuropathy, and obstructive sleep apnea.  The patient is on chronic oral anticoagulation for prevention of thromboembolism in the setting of paroxysmal atrial fibrillation.  He has been followed with serial echo studies because of the presence of aortic stenosis.  A recent echo demonstrated progression of aortic stenosis now with a mean transvalvular gradient of 44 mmHg, peak transaortic velocity of 4.54 m/s, dimensionless index 0.2, and calculated aortic valve area of 0.98 cm.  This recent study demonstrates progression of his aortic stenosis compared to an echo from March 2022 when his mean gradient was 37 mmHg.  The patient is here with his wife today. 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 He had 2 children, one passed away, and he has a 17 year-old son who lives in [place]. He has known of a heart murmur for 15-20 years. He's been followed closely by Dr Tamala Julian and again his most recent echo has demonstrated progressive and now severe aortic stenosis. He has noticed progressive fatigue, dyspnea, and occasional lightheadedness over the past year. He is dyspneic with bending forward or with any moderate level activity. He denies chest pain or pressure, only some 'twinges' in the left chest that are fleeting in nature.   The patient has osteoarthritis and has had both knees  replaced and also required surgical repair of a traumatic left hip fracture. He has had prostate CA treated with radioactive seed implantation.   Past Medical History:  Diagnosis Date   Anxiety    Aortic stenosis    BPH (benign prostatic hyperplasia)    BPH without obstruction/lower urinary tract symptoms    Depression    Diabetic peripheral neuropathy ([place]) 01/07/2019   ED (erectile dysfunction)    Essential hypertension    GERD (gastroesophageal reflux disease)    Heart murmur    Hiatal hernia    History of chronic bronchitis    per pt used inhaler as needed   History of DVT of lower extremity 2001   s/p  left TKA completed blood thinner treatment,  per pt no previous clot and none since   History of gastric polyp    per pt benign   History of kidney stones    Hyperlipidemia    Mild CAD cardiologist--- dr h. Tamala Julian   nuclear study 04-14-2017 normal , low risk, nuclear ef 53%;  cardiac cath 09-19-2018  midRCA 30% stenosis otherwise normal coronaries,  heavy AV calicification with decreased mobility with peak grandiant 84mmHg   Mild dilation of ascending aorta ([place])    per echo 01-13-2021  54mm   Nephrolithiasis    OA (osteoarthritis)    OSA (obstructive sleep apnea)    per pt refused cpap, intolerant;  moderate osa per study in epic 03-08-2015   PAF (paroxysmal atrial fibrillation) ([place])    followed by dr h.  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 04/ 2016   Peroneal neuropathy at knee, right 12/05/2018   Prostate cancer [facility]) urologist--- dr dahlstedt/  oncology-- dr Tammi Klippel   first dx 03/ 2021,  Glesaon 3+4  PSA 6.95   Pulmonary nodule 2019   incidently finding on CTA 09-25-2018, stable   Right carpal tunnel syndrome 01/07/2019   Type 2 diabetes mellitus ([place])    followed by pcp   (03-02-2021  pt stated does not check blood sugar at home)   Umbilical hernia     Past Surgical History:  Procedure Laterality Date   ATRIAL FIBRILLATION ABLATION N/A 03/12/2015    Procedure: ATRIAL FIBRILLATION ABLATION;  Surgeon: [name], MD;  Location: [facility] CATH [facility];  Service: Cardiovascular;  Laterality: N/A;   CARDIAC CATHETERIZATION  1990's   "Dr. Melvern Banker", no blockages per pt   CARDIOVERSION N/A 02/25/2015   Procedure: CARDIOVERSION;  Surgeon: Sueanne Margarita, MD;  Location: [facility];  Service: Cardiovascular;  Laterality: N/A;   CATARACT EXTRACTION W/ INTRAOCULAR LENS  IMPLANT, BILATERAL  2016   COLONOSCOPY  last one 2013  dr Earlean Shawl   CYSTOSCOPY N/A 03/08/2021   Procedure: CYSTOSCOPY BLADDER CALCULI EXTRACTION ;  Surgeon: Franchot Gallo, MD;  Location: [facility];  Service: Urology;  Laterality: N/A;   CYSTOSCOPY WITH RETROGRADE PYELOGRAM, URETEROSCOPY AND STENT PLACEMENT Left 04/24/2015   Procedure: URETHRAL MEATAL DILATION, CYSTOSCOPY WITH LEFT RETROGRADE PYELOGRAM, LEFT URETEROSCOPY, STONE BASKET EXTRACTION,  LEFT DOUBLE J STENT PLACEMENT;  Surgeon: Carolan Clines, MD;  Location: [facility];  Service: Urology;  Laterality: Left;   EP IMPLANTABLE DEVICE N/A 09/15/2015   Procedure: Loop Recorder Insertion;  Surgeon: [name], MD;  Location: [facility];  Service: Cardiovascular;  Laterality: N/A;   ESOPHAGOGASTRODUODENOSCOPY  last one 2017   EXTRACORPOREAL SHOCK WAVE LITHOTRIPSY Bilateral 04/02/2020   Procedure: EXTRACORPOREAL SHOCK WAVE LITHOTRIPSY (ESWL);  Surgeon: Cleon Gustin, MD;  Location[facility];  Service: Urology;  Laterality: Bilateral;   EXTRACORPOREAL SHOCK WAVE LITHOTRIPSY Left 04/06/2020   Procedure: EXTRACORPOREAL SHOCK WAVE LITHOTRIPSY (ESWL);  Surgeon: [name], MD;  Location: [facility];  Service: Urology;  Laterality: Left;   EYE SURGERY     HOLMIUM LASER APPLICATION Left 34/19/3790   Procedure: HOLMIUM LASER APPLICATION;  Surgeon: Carolan Clines, MD;  Location: [facility];  Service: Urology;  Laterality: Left;   implantable loopr recorder removal   02/22/2021   MDT LINQ removed in office by Dr Allred   JOINT REPLACEMENT     KNEE ARTHROSCOPY Bilateral "multiple times"   LEFT HEART CATH AND CORONARY ANGIOGRAPHY N/A 09/19/2018   Procedure: LEFT HEART CATH AND CORONARY ANGIOGRAPHY;  Surgeon: Belva Crome, MD;  Location: [facility];  Service: Cardiovascular;  Laterality: N/A;   PERCUTANEOUS NEPHROSTOLITHOTOMY  1980s and 1990s   RADIOACTIVE SEED IMPLANT N/A 03/08/2021   Procedure: RADIOACTIVE SEED IMPLANT/BRACHYTHERAPY IMPLANT;  Surgeon: Franchot Gallo, MD;  Location: [facility];  Service: Urology;  Laterality: N/A;  90 MINS   RIGHT/LEFT HEART CATH AND CORONARY ANGIOGRAPHY N/A 08/04/2021   Procedure: RIGHT/LEFT HEART CATH AND CORONARY ANGIOGRAPHY;  Surgeon: Belva Crome, MD;  Location: [facility];  Service: Cardiovascular;  Laterality: N/A;   SKIN BIOPSY Left 05/06/2020   Shave biopsy left posterior hand neurofiberoma.   SPACE OAR INSTILLATION N/A 03/08/2021   Procedure: SPACE OAR INSTILLATION;  Surgeon: Franchot Gallo, MD;  Location: [facility];  Service: Urology;  Laterality: N/A;   TEE WITHOUT CARDIOVERSION N/A 03/12/2015   Procedure: TRANSESOPHAGEAL ECHOCARDIOGRAM (TEE);  Surgeon: Pixie Casino, MD;  Location: [facility];  Service: Cardiovascular;  Laterality: N/A;   TONSILLECTOMY  age 36   TOTAL KNEE ARTHROPLASTY Left 11-22-1999  @[facility]    TOTAL KNEE ARTHROPLASTY Right 10/30/2017   Procedure: RIGHT TOTAL KNEE ARTHROPLASTY;  Surgeon: Paralee Cancel, MD;  Location: [facility];  Service: Orthopedics;  Laterality: Right;  90 mins   UMBILICAL HERNIA REPAIR N/A 07/05/2021   Procedure: REPAIR INCARCERATED UMBILICAL HERNIA WITH MESH;  Surgeon: Georganna Skeans, MD;  Location: [facility];  Service: General;  Laterality: N/A;    Current Medications: Current Meds  Medication Sig   acetaminophen (TYLENOL) 500 MG tablet Take 1,000 mg by mouth every 8 (eight) hours as needed for mild pain.     albuterol (VENTOLIN HFA) 108 (90 Base) MCG/ACT inhaler INHALE 2 PUFFS INTO THE LUNGS EVERY 6 HOURS AS NEEDED FOR WHEEZING OR SHORTNESS OF BREATH   allopurinol (ZYLOPRIM) 300 MG tablet Take 1 tablet (300 mg total) by mouth daily.   ammonium lactate (LAC-HYDRIN) 12 % lotion Apply 1 application topically daily as needed for dry skin.   atorvastatin (LIPITOR) 40 MG tablet Take 1 tablet (40 mg total) by mouth at bedtime.   buPROPion (WELLBUTRIN XL) 300 MG 24 hr tablet Take 1 tablet (300 mg total) by mouth daily.   diltiazem (TIAZAC) 180 MG 24 hr capsule Take 1 capsule (180 mg total) by mouth daily.   ELIQUIS 5 MG TABS tablet TAKE ONE TABLET BY MOUTH TWICE A DAY   finasteride (PROSCAR) 5 MG tablet Take 5 mg by mouth daily.   fluticasone (FLONASE) 50 MCG/ACT nasal spray Place 2 sprays into both nostrils daily.   HYDROcodone-acetaminophen (NORCO/VICODIN) 5-325 MG tablet Take 1 tablet by mouth every 6 (six) hours as needed for moderate pain.   loratadine (CLARITIN) 10 MG tablet Take 10 mg by mouth daily.   LORazepam (ATIVAN) 0.5 MG tablet TAKE 1 TABLET TWICE DAILY AS NEEDED FOR ANXIETY.   metFORMIN (GLUCOPHAGE) 500 MG tablet Take 1 tablet (500 mg total) by mouth 2 (two) times daily with a meal.   Multiple Vitamins-Minerals (MULTIVITAMIN WITH MINERALS) tablet Take 1 tablet by mouth daily.   olopatadine (PATANOL) 0.1 % ophthalmic solution Place 1 drop into both eyes daily as needed for allergies (tearing).    Omega-3 Fatty Acids (FISH OIL) 1000 MG CAPS Take 1,000 mg by mouth daily.   omeprazole (PRILOSEC) 20 MG capsule Take 20 mg by mouth daily.   sertraline (ZOLOFT) 50 MG tablet Take 1 tablet (50 mg total) by mouth daily.   sildenafil (VIAGRA) 100 MG tablet Take 1 tablet (100 mg total) by mouth daily as needed for erectile dysfunction.   tamsulosin (FLOMAX) 0.4 MG CAPS capsule Take 0.8 mg by mouth in the morning and at bedtime.   zolpidem (AMBIEN) 5 MG tablet Take 1 tablet (5 mg total) by mouth at bedtime  as needed for sleep.   Current Facility-Administered Medications for the 08/10/21 encounter (Office Visit) with Sherren Mocha, MD  Medication   sodium chloride flush (NS) 0.9 % injection 3 mL     Allergies:   Oxycodone   Social History   Socioeconomic History   Marital status: Married    Spouse name: Not on file   Number of children: 1   Years of education: Not on file   Highest education level: Some college, no degree  Occupational History   Occupation: Retired  Tobacco Use   Smoking status: Never   Smokeless tobacco: Never  Vaping Use   Vaping Use: Never used  Substance and Sexual Activity   Alcohol use: Yes    Comment: very rare   Drug use: Never   Sexual activity: Yes  Other Topics Concern   Not on file  Social History Narrative   Pt lives in [place] with spouse.  32 yo son is health.  Second son died in car accident.      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 There is no history of Breast cancer, Colon cancer, Pancreatic cancer, or Prostate cancer.  ROS:   Please see the history of present illness.    All other systems reviewed and are negative.  EKGs/Labs/Other Studies Reviewed:    The following studies were reviewed today: Cardiac Cath 08/04/2021: CONCLUSIONS: Normal left main 40% proximal RCA stenosis.  Right dominant anatomy. Widely patent circumflex. 20 to 30% mid LAD.  The LAD reaches the left ventricular apex. Normal pulmonary artery pressures, mean 23 mmHg. Pulmonary  capillary wedge pressure mean, 13 mmHg.   RECOMMENDATIONS: Referred to the aortic valve clinic for consideration of TAVR versus SAVR  2d Echo: 1. Left ventricular ejection fraction, by estimation, is 60 to 65%. The  left ventricle has normal function. The left ventricle has no regional  wall motion abnormalities. There is moderate asymmetric left ventricular  hypertrophy of the basal-septal  segment. Left ventricular diastolic parameters are consistent with Grade I  diastolic dysfunction (impaired relaxation).   2. Right ventricular systolic function is normal. The right ventricular  size is normal. Tricuspid regurgitation signal is inadequate for assessing  PA pressure.   3. Left atrial size was moderately dilated.   4. The mitral valve is grossly normal. Mild mitral valve regurgitation.   5. The aortic valve is tricuspid. There is moderate calcification of the  aortic valve. Aortic valve regurgitation is mild. Severe aortic valve  stenosis. Aortic valve area, by VTI measures 0.98 cm. Aortic valve mean  gradient measures 44.0 mmHg. Aortic  valve Vmax measures 4.54 m/s. Peak gradient of 82 mmHg. DI is 0.20.   6. The inferior vena cava is normal in size with <50% respiratory  variability, suggesting right atrial pressure of 8 mmHg.   Comparison(s): Changes from prior study are noted. 01/13/2021: LVEF 60-65%,  moderate to severe AS - mean gradient 37 mmHg.   EKG:  EKG is ordered today.  The ekg ordered today demonstrates normal sinus rhythm 83 bpm, T wave abnormality consider inferior ischemia, otherwise normal.  Recent Labs: 04/01/2021: ALT 25 07/27/2021: BUN 14; Creatinine, Ser 0.82; Platelets 211; TSH 2.280 08/04/2021: Hemoglobin 12.9; Potassium 4.0; Sodium 139  Recent Lipid Panel    Component Value Date/Time   CHOL 137 04/01/2021 1002   TRIG 166 (H) 04/01/2021 1002   HDL 43 04/01/2021 1002   CHOLHDL 3.2 04/01/2021 1002   CHOLHDL 3.6 08/16/2016 0925  VLDL 28 08/16/2016 0925    LDLCALC 66 04/01/2021 1002     Risk Assessment/Calculations:    CHA2DS2-VASc Score = 3   This indicates a 3.2% annual risk of stroke. The patient's score is based upon: CHF History: 0 HTN History: 1 Diabetes History: 1 Stroke History: 0 Vascular Disease History: 0 Age Score: 1 Gender Score: 0        STS Risk Calculator: Isolated AVR: Risk of Mortality: 0.821%  Renal Failure: 1.127%  Permanent Stroke: 0.574%  Prolonged Ventilation: 3.523%  DSW Infection: 0.133%  Reoperation: 3.403%  Morbidity or Mortality: 7.151%  Short Length of Stay: 50.570%  Long Length of Stay: 2.639%    Physical Exam:    VS:  BP 140/84   Pulse 82   Ht 6\' 1"  (1.854 m)   Wt 233 lb 9.6 oz (106 kg)   SpO2 95%   BMI 30.82 kg/m     Wt Readings from Last 3 Encounters:  08/10/21 233 lb 9.6 oz (106 kg)  08/04/21 235 lb (106.6 kg)  08/03/21 235 lb 6.4 oz (106.8 kg)     GEN:  Well nourished, well developed in no acute distress HEENT: Normal NECK: No JVD; No carotid bruits LYMPHATICS: No lymphadenopathy CARDIAC: RRR, 3/6 harsh mid peaking systolic murmur at the right upper sternal border, no diastolic murmur, A2 diminished but present RESPIRATORY:  Clear to auscultation without rales, wheezing or rhonchi  ABDOMEN: Soft, non-tender, non-distended MUSCULOSKELETAL:  No edema; No deformity  SKIN: Warm and dry NEUROLOGIC:  Alert and oriented x 3 PSYCHIATRIC:  Normal affect   ASSESSMENT:    1. Severe aortic stenosis    PLAN:    In order of problems listed above:  The patient has severe, stage D1 aortic stenosis associated with NYHA functional class II symptoms of dyspnea, fatigue, consistent with chronic diastolic heart failure.  Symptoms have slowly progressed over the past year. I have reviewed the natural history of aortic stenosis with the patient and their family members who are present today. We have discussed the limitations of medical therapy and the poor prognosis associated with  symptomatic aortic stenosis. We have reviewed potential treatment options, including palliative medical therapy, conventional surgical aortic valve replacement, and transcatheter aortic valve replacement. We discussed treatment options in the context of the patient's specific comorbid medical conditions.  The patient's 2D echocardiogram is reviewed as described above.  LV function is normal with an LVEF of 60 to 65%.  There is progressive aortic stenosis, with a mean transvalvular gradient of 44 mmHg, peak velocity of 4.5 m/s, and dimensionless index of 0.20.  The aortic valve is moderately calcified and restricted on 2D imaging.  Doppler findings are clearly consistent with severe aortic stenosis.  Cardiac catheterization data is also reviewed.  He has no evidence of high-grade obstructive CAD.  The aortic valve on plain fluoroscopy is shown to be severely calcified with restricted leaflet mobility.  Aortic valve replacement is clearly indicated in this patient with symptomatic severe aortic stenosis.  He will undergo CT angiography studies to evaluate anatomic suitability for TAVR.  He understands that based on both his cardiac anatomy and peripheral anatomy, we can determine if he is a good candidate for TAVR.  I suspect he would be a candidate for conventional surgical aortic valve replacement as well.  Once his CTA studies are completed, he will be referred for formal cardiac surgical evaluation with Dr. Cyndia Bent as part of a multidisciplinary team approach to his care.  I reviewed  the pros and cons of conventional surgical AVR versus transcatheter valve replacement with the patient and his wife today.  I demonstrated a procedural animation of the TAVR procedure and discussed specific risks with them.  If he has favorable anatomy, he would prefer TAVR if possible.  The patient also has nasal obstruction and polyposis with previous plans for surgical therapy by Dr. Wilburn Cornelia.  As long as the surgery is low risk,  I think it would be okay for him to proceed considering his stable and relatively mild symptoms associated with aortic stenosis.  However, it probably would be best to do this in the hospital setting rather than in a surgical setting with his known cardiac disease.  Medication Adjustments/Labs and Tests Ordered: Current medicines are reviewed at length with the patient today.  Concerns regarding medicines are outlined above.  Orders Placed This Encounter  Procedures   EKG 12-Lead    No orders of the defined types were placed in this encounter.   Patient Instructions  Medication Instructions:   Your physician recommends that you continue on your current medications as directed. Please refer to the Current Medication list given to you today.  *If you need a refill on your cardiac medications before your next appointment, please call your pharmacy*    Follow-Up:  Our structural team will be in contact with you about further appointments/follow-ups/ testing.   Please follow all TAVR testing instructions/letter as provided to you today.     Signed, Sherren Mocha, MD  08/10/2021 5:56 PM    [place]

## 2021-08-17 ENCOUNTER — Ambulatory Visit (HOSPITAL_COMMUNITY): Admission: RE | Admit: 2021-08-17 | Payer: PPO | Source: Ambulatory Visit

## 2021-08-17 ENCOUNTER — Telehealth: Payer: Self-pay

## 2021-08-17 ENCOUNTER — Ambulatory Visit (HOSPITAL_COMMUNITY)
Admission: RE | Admit: 2021-08-17 | Discharge: 2021-08-17 | Disposition: A | Discharge status: Home / Self Care | Payer: PPO | Source: Ambulatory Visit | Attending: Cardiovascular Disease | Admitting: Cardiovascular Disease

## 2021-08-17 ENCOUNTER — Other Ambulatory Visit: Payer: Self-pay

## 2021-08-17 ENCOUNTER — Encounter (HOSPITAL_COMMUNITY): Payer: Self-pay

## 2021-08-17 DIAGNOSIS — I350 Nonrheumatic aortic (valve) stenosis: Secondary | ICD-10-CM | POA: Insufficient documentation

## 2021-08-17 MED ORDER — METOPROLOL TARTRATE 50 MG PO TABS: 50.0000 mg | ORAL_TABLET | Freq: Once | ORAL | 0 refills | Status: DC

## 2021-08-17 NOTE — Telephone Encounter (Signed)
Due to CT scanner malfunction, rescheduled the patient to 08/25/2021 at 1300 for TAVR scans.  Reviewed instructions.  Metoprolol 50 mg called in.  The patient requests to see Dr. Cyndia Bent earlier than 09/15/2021. Reiterated to him that that is Dr. Vivi Martens soonest appointment at this time and informed him he will be called if a cancellation is seen. He was grateful for call and agrees with plan.

## 2021-08-25 ENCOUNTER — Ambulatory Visit (HOSPITAL_COMMUNITY)
Admission: RE | Admit: 2021-08-25 | Discharge: 2021-08-25 | Disposition: A | Discharge status: Home / Self Care | Payer: PPO | Source: Ambulatory Visit | Attending: Cardiovascular Disease | Admitting: Cardiovascular Disease

## 2021-08-25 ENCOUNTER — Other Ambulatory Visit: Payer: Self-pay

## 2021-08-25 ENCOUNTER — Encounter (HOSPITAL_COMMUNITY): Payer: Self-pay

## 2021-08-25 ENCOUNTER — Telehealth: Payer: Self-pay | Admitting: Interventional Cardiology

## 2021-08-25 ENCOUNTER — Ambulatory Visit (HOSPITAL_COMMUNITY): Payer: PPO

## 2021-08-25 DIAGNOSIS — K760 Fatty (change of) liver, not elsewhere classified: Secondary | ICD-10-CM | POA: Diagnosis not present

## 2021-08-25 DIAGNOSIS — I350 Nonrheumatic aortic (valve) stenosis: Secondary | ICD-10-CM | POA: Diagnosis not present

## 2021-08-25 DIAGNOSIS — I2510 Atherosclerotic heart disease of native coronary artery without angina pectoris: Secondary | ICD-10-CM | POA: Diagnosis not present

## 2021-08-25 DIAGNOSIS — I700 Atherosclerosis of aorta: Secondary | ICD-10-CM | POA: Diagnosis not present

## 2021-08-25 DIAGNOSIS — N200 Calculus of kidney: Secondary | ICD-10-CM | POA: Diagnosis not present

## 2021-08-25 MED ORDER — METOPROLOL TARTRATE 5 MG/5ML IV SOLN
INTRAVENOUS | Status: AC
  Administered 2021-08-25: 5 mg
  Filled 2021-08-25: qty 10

## 2021-08-25 MED ORDER — IOHEXOL 350 MG/ML SOLN
100.0000 mL | Freq: Once | INTRAVENOUS | Status: AC | PRN
  Administered 2021-08-25: 100 mL via INTRAVENOUS

## 2021-08-25 NOTE — Telephone Encounter (Signed)
Pt is requesting that you contact him in regards to his upcoming appt on 10/14... please advise.. no additional details

## 2021-08-26 NOTE — Telephone Encounter (Signed)
Please get more information. I am not in clinic tomorrow so this appointment is not with me. Looks like he sees Dr. Tamala Julian tomorrow. Does not necessarily need to keep that appt if he is doing well since he is already plugged into the TAVR team, saw Dr. Burt Knack 08/10/21 and is moving along with procedural planning with them.

## 2021-08-26 NOTE — Telephone Encounter (Signed)
Spoke with pt and discussed that appt tomorrow was not needed.  Cancelled appt with Dr. Tamala Julian and advised we would plan to see him sometime after his TAVR.  Pt appreciative for call.

## 2021-08-26 NOTE — Telephone Encounter (Signed)
Left message to call back  

## 2021-08-27 ENCOUNTER — Ambulatory Visit: Payer: PPO | Admitting: Interventional Cardiology

## 2021-08-30 DIAGNOSIS — C61 Malignant neoplasm of prostate: Secondary | ICD-10-CM | POA: Diagnosis not present

## 2021-09-06 DIAGNOSIS — N5201 Erectile dysfunction due to arterial insufficiency: Secondary | ICD-10-CM | POA: Diagnosis not present

## 2021-09-06 DIAGNOSIS — C61 Malignant neoplasm of prostate: Secondary | ICD-10-CM | POA: Diagnosis not present

## 2021-09-07 ENCOUNTER — Ambulatory Visit (INDEPENDENT_AMBULATORY_CARE_PROVIDER_SITE_OTHER): Payer: PPO

## 2021-09-07 ENCOUNTER — Other Ambulatory Visit: Payer: Self-pay

## 2021-09-07 DIAGNOSIS — Z23 Encounter for immunization: Secondary | ICD-10-CM

## 2021-09-13 ENCOUNTER — Encounter: Payer: Self-pay | Admitting: Physician Assistant

## 2021-09-13 ENCOUNTER — Institutional Professional Consult (permissible substitution): Payer: PPO | Admitting: Cardiovascular Disease

## 2021-09-15 ENCOUNTER — Other Ambulatory Visit: Payer: Self-pay | Admitting: *Deleted

## 2021-09-15 ENCOUNTER — Encounter: Payer: Self-pay | Admitting: Surgery

## 2021-09-15 ENCOUNTER — Other Ambulatory Visit: Payer: Self-pay

## 2021-09-15 ENCOUNTER — Ambulatory Visit: Payer: PPO | Attending: Cardiovascular Disease

## 2021-09-15 ENCOUNTER — Institutional Professional Consult (permissible substitution): Payer: PPO | Admitting: Surgery

## 2021-09-15 VITALS — BP 129/66 | HR 73 | Resp 20 | Ht 73.0 in | Wt 231.0 lb

## 2021-09-15 DIAGNOSIS — I350 Nonrheumatic aortic (valve) stenosis: Secondary | ICD-10-CM

## 2021-09-15 DIAGNOSIS — R262 Difficulty in walking, not elsewhere classified: Secondary | ICD-10-CM | POA: Insufficient documentation

## 2021-09-15 NOTE — Therapy (Signed)
[place], [place], [address] Phone: [phone]   Fax:  [phone]  Physical Therapy Evaluation  Patient Details  Name: Jeremy Tucker MRN: [id number] Date of Birth: 1951/07/08 Referring Provider (PT): Sherren Mocha, MD   Encounter Date: 09/15/2021   PT End of Session - 09/15/21 1326     Visit Number 1    Authorization Type [employer]    PT Start Time 5631    PT Stop Time 4970    PT Time Calculation (min) 29 min    Equipment Utilized During Treatment Gait belt    Activity Tolerance Patient tolerated treatment well    Behavior During Therapy WFL for tasks assessed/performed             Past Medical History:  Diagnosis Date   Anxiety    BPH (benign prostatic hyperplasia)    BPH without obstruction/lower urinary tract symptoms    Depression    Diabetic peripheral neuropathy ([place]) 01/07/2019   ED (erectile dysfunction)    Essential hypertension    GERD (gastroesophageal reflux disease)    Hiatal hernia    History of chronic bronchitis    per pt used inhaler as needed   History of DVT of lower extremity 2001   s/p  left TKA completed blood thinner treatment,  per pt no previous clot and none since   History of gastric polyp    per pt benign   History of kidney stones    Hyperlipidemia    Mild CAD cardiologist--- dr h. Tamala Julian   nuclear study 04-14-2017 normal , low risk, nuclear ef 53%;  cardiac cath 09-19-2018  midRCA 30% stenosis otherwise normal coronaries,  heavy AV calicification with decreased mobility with peak grandiant 103mmHg   Mild dilation of ascending aorta ([place])    per echo 01-13-2021  43mm   Nephrolithiasis    OA (osteoarthritis)    OSA (obstructive sleep apnea)    per pt refused cpap, intolerant;  moderate osa per study in epic 03-08-2015   PAF (paroxysmal atrial fibrillation) ([place])    followed by dr h. 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 and ep ablation 04/ 2016    Peroneal neuropathy at knee, right 12/05/2018   Prostate cancer [facility]) urologist--- dr dahlstedt/  oncology-- dr Tammi Klippel   first dx 03/ 2021,  G[place]on 3+4  PSA 6.95   Pulmonary nodule 2019   incidently finding on CTA 09-25-2018, stable   Right carpal tunnel syndrome 01/07/2019   Severe aortic stenosis    Type 2 diabetes mellitus ([place])    followed by pcp   (03-02-2021  pt stated does not check blood sugar at home)   Umbilical hernia     Past Surgical History:  Procedure Laterality Date   ATRIAL FIBRILLATION ABLATION N/A 03/12/2015   Procedure: ATRIAL FIBRILLATION ABLATION;  Surgeon: [name], MD;  Location: [facility] CATH LAB;  Service: Cardiovascular;  Laterality: N/A;   CARDIAC CATHETERIZATION  1990's   "Dr. Melvern Banker", no blockages per pt   CARDIOVERSION N/A 02/25/2015   Procedure: CARDIOVERSION;  Surgeon: Sueanne Margarita, MD;  Location: [place] [facility];  Service: Cardiovascular;  Laterality: N/A;   CATARACT EXTRACTION W/ INTRAOCULAR LENS  IMPLANT, BILATERAL  2016   COLONOSCOPY  last one 2013  dr Earlean Shawl   CYSTOSCOPY N/A 03/08/2021   Procedure: CYSTOSCOPY BLADDER CALCULI EXTRACTION ;  Surgeon: Franchot Gallo, MD;  Location: [facility];  Service: Urology;  Laterality: N/A;   CYSTOSCOPY  WITH RETROGRADE PYELOGRAM, URETEROSCOPY AND STENT PLACEMENT Left 04/24/2015   Procedure: URETHRAL MEATAL DILATION, CYSTOSCOPY WITH LEFT RETROGRADE PYELOGRAM, LEFT URETEROSCOPY, STONE BASKET EXTRACTION,  LEFT DOUBLE J STENT PLACEMENT;  Surgeon: Carolan Clines, MD;  Location: [facility];  Service: Urology;  Laterality: Left;   EP IMPLANTABLE DEVICE N/A 09/15/2015   Procedure: Loop Recorder Insertion;  Surgeon: [name], MD;  Location: [place] CV LAB;  Service: Cardiovascular;  Laterality: N/A;   ESOPHAGOGASTRODUODENOSCOPY  last one 2017   EXTRACORPOREAL SHOCK WAVE LITHOTRIPSY Bilateral 04/02/2020   Procedure: EXTRACORPOREAL SHOCK WAVE LITHOTRIPSY (ESWL);  Surgeon: Cleon Gustin,  MD;  Location: [facility];  Service: Urology;  Laterality: Bilateral;   EXTRACORPOREAL SHOCK WAVE LITHOTRIPSY Left 04/06/2020   Procedure: EXTRACORPOREAL SHOCK WAVE LITHOTRIPSY (ESWL);  Surgeon: [name], MD;  Location: [facility];  Service: Urology;  Laterality: Left;   EYE SURGERY     HOLMIUM LASER APPLICATION Left 81/82/9937   Procedure: HOLMIUM LASER APPLICATION;  Surgeon: Carolan Clines, MD;  Location: [facility];  Service: Urology;  Laterality: Left;   implantable loopr recorder removal  02/22/2021   MDT LINQ removed in office by Dr Allred   JOINT REPLACEMENT     KNEE ARTHROSCOPY Bilateral "multiple times"   LEFT HEART CATH AND CORONARY ANGIOGRAPHY N/A 09/19/2018   Procedure: LEFT HEART CATH AND CORONARY ANGIOGRAPHY;  Surgeon: Belva Crome, MD;  Location: [place] CV LAB;  Service: Cardiovascular;  Laterality: N/A;   PERCUTANEOUS NEPHROSTOLITHOTOMY  1980s and 1990s   RADIOACTIVE SEED IMPLANT N/A 03/08/2021   Procedure: RADIOACTIVE SEED IMPLANT/BRACHYTHERAPY IMPLANT;  Surgeon: Franchot Gallo, MD;  Location: [facility];  Service: Urology;  Laterality: N/A;  90 MINS   RIGHT/LEFT HEART CATH AND CORONARY ANGIOGRAPHY N/A 08/04/2021   Procedure: RIGHT/LEFT HEART CATH AND CORONARY ANGIOGRAPHY;  Surgeon: Belva Crome, MD;  Location: [place] CV LAB;  Service: Cardiovascular;  Laterality: N/A;   SKIN BIOPSY Left 05/06/2020   Shave biopsy left posterior hand neurofiberoma.   SPACE OAR INSTILLATION N/A 03/08/2021   Procedure: SPACE OAR INSTILLATION;  Surgeon: Franchot Gallo, MD;  Location: [facility];  Service: Urology;  Laterality: N/A;   TEE WITHOUT CARDIOVERSION N/A 03/12/2015   Procedure: TRANSESOPHAGEAL ECHOCARDIOGRAM (TEE);  Surgeon: Pixie Casino, MD;  Location: [facility]COPY;  Service: Cardiovascular;  Laterality: N/A;   TONSILLECTOMY  age 5   TOTAL KNEE ARTHROPLASTY Left 11-22-1999  @[facility]    TOTAL  KNEE ARTHROPLASTY Right 10/30/2017   Procedure: RIGHT TOTAL KNEE ARTHROPLASTY;  Surgeon: Paralee Cancel, MD;  Location: [facility];  Service: Orthopedics;  Laterality: Right;  90 mins   UMBILICAL HERNIA REPAIR N/A 07/05/2021   Procedure: REPAIR INCARCERATED UMBILICAL HERNIA WITH MESH;  Surgeon: Georganna Skeans, MD;  Location: [place];  Service: General;  Laterality: N/A;    There were no vitals filed for this visit.    Subjective Assessment - 09/15/21 1327     Subjective Patient reports his cardiologist has been watching his heart for about 2-3 years and his aortic valve is narrowing to the point that surgery is warranted. Patient reports shortness of breath with prolonged walking over the past 6 months and he gets dizzy when he bends over. He also reports occasional headaches that can mostly occur with cognitive tasks. His shortness of breath keeps him from walking at a normal pace and he is unable to keep up the pace with his wife when they are walking.    Patient  is accompained by: Family member   spouse   Currently in Pain? No/denies                [facility] PT Assessment - 09/15/21 0001       Assessment   Medical Diagnosis Pre TAVR PT eval, Severe Aortic Stenosis    Referring Provider (PT) Sherren Mocha, MD    Onset Date/Surgical Date --   SOB onset 6 months ago   Hand Dominance Right    Next MD Visit 09/15/21    Prior Therapy yes      Precautions   Precautions None      Restrictions   Weight Bearing Restrictions No      Balance Screen   Has the patient fallen in the past 6 months No      [place] residence    Living Arrangements Spouse/significant other    Type of [place]    Additional Comments no stairs      Prior Function   Level of [place] Retired      Associate Professor   Overall Cognitive Status Within Functional Limits for tasks assessed      Posture/Postural Control   Posture/Postural Control No  significant limitations      AROM   Overall AROM Comments BUE/LE AROM WNL      Strength   Overall Strength Comments Gross BUE strength 5/5; Gross BLE strength 4+/5    Right Hand Grip (lbs) 90    Left Hand Grip (lbs) 80              [facility] Pre-Surgical Assessment - 09/15/21 0001     5 Meter Walk Test- trial 1 3 sec    5 Meter Walk Test- trial 2 3 sec.     5 Meter Walk Test- trial 3 3 sec.    5 meter walk test average 3 sec    4 Stage Balance Test tolerated for:  10 sec.    4 Stage Balance Test Position 4    Sit To Stand Test- trial 1 12.6 sec.    6 Minute Walk- Baseline yes    BP (mmHg) 135/64    HR (bpm) 64    02 Sat (%RA) 96 %    Modified Borg Scale for Dyspnea 4- somewhat severe    Perceived Rate of Exertion (Borg) 6-    6 Minute Walk Post Test yes    BP (mmHg) 138/82    HR (bpm) 101    02 Sat (%RA) 95 %    Modified Borg Scale for Dyspnea 3- Moderate shortness of breath or breathing difficulty    Perceived Rate of Exertion (Borg) 12-    Aerobic Endurance Distance Walked 1690    Endurance additional comments 2% disability compared to age related normative value                      Objective measurements completed on examination: See above findings.                             Plan - 09/15/21 1400     PT Frequency One time visit    Consulted and Agree with Plan of Care Patient;Family member/caregiver    Family Member Consulted spouse            Clinical Impression Statement: Pt is a 69 yo male presenting to OP  PT for evaluation prior to possible TAVR surgery due to severe aortic stenosis. Pt reports onset of shortness of breath 6 months ago. Symptoms are  limiting tolerance to prolonged walking. Pt presents with WNL ROM and strength and 0/10 musculoskeletal pain.  Pt ambulated a total of 1690 feet in 6 minute walk requiring no rest breaks. He reported 3/10 SOB on modified scale for dyspena and 12/20 RPE on Borg's perceived  exertion and 0/10 pain scale at the end of the walk. During the 6 minute walk test, patient's HR increased to 101 BPM and O2 saturation decreased to 95%. Based on the Short Physical Performance Battery, patient has a frailty rating of 11/12 with </= 5/12 considered frail.     Visit Diagnosis: Difficulty in walking, not elsewhere classified     Problem List Patient Active Problem List   Diagnosis Date Noted   Ureteric stone 12/14/2020   Malignant neoplasm of prostate ([place]) 01/31/2020   Chronic pansinusitis 12/06/2019   Deviated septum 12/06/2019   Nasal obstruction 12/06/2019   Nasal polyposis 12/06/2019   Diabetic peripheral neuropathy ([place]) 01/07/2019   Right carpal tunnel syndrome 01/07/2019   Peroneal neuropathy at knee, right 12/05/2018   History of cataract extraction 08/21/2018   History of adenomatous polyp of colon 07/04/2018   Hyperlipidemia 07/04/2018   S/P right TKA 10/30/2017   Chronic anticoagulation 08/31/2017   Insomnia 03/16/2017   History of renal stone 06/10/2015   Hypertension associated with diabetes ([place]) 05/25/2015   Paroxysmal atrial fibrillation ([facility])    Severe aortic stenosis    OSA (obstructive sleep apnea) 03/09/2015   GERD (gastroesophageal reflux disease)    BPH (benign prostatic hyperplasia)    Diastolic dysfunction-grade 15 Sep 2014 01/21/2015   Hyperlipidemia associated with type 2 diabetes mellitus ([place]) 12/05/2011   Controlled type 2 diabetes mellitus with complication, without long-term current use of insulin ([place]) 12/05/2011   Obesity (BMI 30-39.9) 12/05/2011   Asthma 12/05/2011   Arthritis 12/05/2011   ED (erectile dysfunction) 12/05/2011   Gwendolyn Grant, PT, DPT, ATC 09/15/21 2:06 PM   [facility] [address] [place], [place], [address] Phone: [phone]   Fax:  [phone]  Name: Jeremy Tucker MRN: [id number] Date of Birth: [birth year]

## 2021-09-16 ENCOUNTER — Other Ambulatory Visit: Payer: Self-pay

## 2021-09-16 ENCOUNTER — Encounter (HOSPITAL_COMMUNITY)
Admission: RE | Admit: 2021-09-16 | Discharge: 2021-09-16 | Disposition: A | Discharge status: Home / Self Care | Payer: PPO | Source: Ambulatory Visit | Attending: Surgery | Admitting: Surgery

## 2021-09-16 ENCOUNTER — Encounter (HOSPITAL_COMMUNITY): Payer: Self-pay

## 2021-09-16 ENCOUNTER — Ambulatory Visit (HOSPITAL_COMMUNITY)
Admission: RE | Admit: 2021-09-16 | Discharge: 2021-09-16 | Disposition: A | Discharge status: Home / Self Care | Payer: PPO | Source: Ambulatory Visit | Attending: Surgery | Admitting: Surgery

## 2021-09-16 DIAGNOSIS — I350 Nonrheumatic aortic (valve) stenosis: Secondary | ICD-10-CM

## 2021-09-16 DIAGNOSIS — I4891 Unspecified atrial fibrillation: Secondary | ICD-10-CM | POA: Diagnosis not present

## 2021-09-16 DIAGNOSIS — Z01818 Encounter for other preprocedural examination: Secondary | ICD-10-CM | POA: Diagnosis not present

## 2021-09-16 DIAGNOSIS — Z20822 Contact with and (suspected) exposure to covid-19: Secondary | ICD-10-CM | POA: Diagnosis not present

## 2021-09-16 DIAGNOSIS — E119 Type 2 diabetes mellitus without complications: Secondary | ICD-10-CM | POA: Diagnosis not present

## 2021-09-16 DIAGNOSIS — I10 Essential (primary) hypertension: Secondary | ICD-10-CM | POA: Insufficient documentation

## 2021-09-16 DIAGNOSIS — E785 Hyperlipidemia, unspecified: Secondary | ICD-10-CM | POA: Insufficient documentation

## 2021-09-16 HISTORY — DX: Dyspnea, unspecified: R06.00

## 2021-09-16 LAB — COMPREHENSIVE METABOLIC PANEL
ALT: 38 U/L (ref 0–44)
AST: 34 U/L (ref 15–41)
Albumin: 4 g/dL (ref 3.5–5.0)
Alkaline Phosphatase: 73 U/L (ref 38–126)
Anion gap: 9 (ref 5–15)
BUN: 10 mg/dL (ref 8–23)
CO2: 24 mmol/L (ref 22–32)
Calcium: 9.2 mg/dL (ref 8.9–10.3)
Chloride: 106 mmol/L (ref 98–111)
Creatinine, Ser: 0.7 mg/dL (ref 0.61–1.24)
GFR, Estimated: >60 mL/min (ref >60)
Glucose, Bld: 123 mg/dL — ABNORMAL HIGH (ref 70–99)
Potassium: 3.8 mmol/L (ref 3.5–5.1)
Sodium: 139 mmol/L (ref 135–145)
Total Bilirubin: 0.3 mg/dL (ref 0.3–1.2)
Total Protein: 6.8 g/dL (ref 6.5–8.1)

## 2021-09-16 LAB — CBC
HCT: 39.4 % (ref 39.0–52.0)
Hemoglobin: 13.5 g/dL (ref 13.0–17.0)
MCH: 31.9 pg (ref 26.0–34.0)
MCHC: 34.3 g/dL (ref 30.0–36.0)
MCV: 93.1 fL (ref 80.0–100.0)
Platelets: 185 10*3/uL (ref 150–400)
RBC: 4.23 MIL/uL (ref 4.22–5.81)
RDW: 12.9 % (ref 11.5–15.5)
WBC: 8.3 10*3/uL (ref 4.0–10.5)
nRBC: 0 % (ref 0.0–0.2)

## 2021-09-16 LAB — URINALYSIS, ROUTINE W REFLEX MICROSCOPIC
Bilirubin Urine: NEGATIVE
Glucose, UA: NEGATIVE mg/dL
Hgb urine dipstick: NEGATIVE
Ketones, ur: NEGATIVE mg/dL
Leukocytes,Ua: NEGATIVE
Nitrite: NEGATIVE
Protein, ur: NEGATIVE mg/dL
Specific Gravity, Urine: 1.021 (ref 1.005–1.030)
pH: 5 (ref 5.0–8.0)

## 2021-09-16 LAB — BLOOD GAS, ARTERIAL
Acid-Base Excess: 0.6 mmol/L (ref 0.0–2.0)
Bicarbonate: 24.4 mmol/L (ref 20.0–28.0)
Drawn by: 58793
FIO2: 21
O2 Saturation: 98.5 %
Patient temperature: 37
pCO2 arterial: 37.5 mmHg (ref 32.0–48.0)
pH, Arterial: 7.43 (ref 7.350–7.450)
pO2, Arterial: 111 mmHg — ABNORMAL HIGH (ref 83.0–108.0)

## 2021-09-16 LAB — TYPE AND SCREEN
ABO/RH(D): A POS
Antibody Screen: NEGATIVE

## 2021-09-16 LAB — PROTIME-INR
INR: 1.1 (ref 0.8–1.2)
Prothrombin Time: 14 seconds (ref 11.4–15.2)

## 2021-09-16 LAB — SURGICAL PCR SCREEN
MRSA, PCR: NEGATIVE
Staphylococcus aureus: NEGATIVE

## 2021-09-16 LAB — HEMOGLOBIN A1C
Hgb A1c MFr Bld: 6.8 % — ABNORMAL HIGH (ref 4.8–5.6)
Mean Plasma Glucose: 148.46 mg/dL

## 2021-09-16 LAB — APTT: aPTT: 34 seconds (ref 24–36)

## 2021-09-16 LAB — GLUCOSE, CAPILLARY: Glucose-Capillary: 125 mg/dL — ABNORMAL HIGH (ref 70–99)

## 2021-09-16 NOTE — Progress Notes (Signed)
[facility]   MULTIDISCIPLINARY HEART VALVE CLINIC         [address]       [place] [address]             [phone]          CARDIOTHORACIC SURGERY CONSULTATION REPORT  PCP is Denita Lung, MD Referring Provider is Sherren Mocha, MD Primary Cardiologist is [name], MD  Reason for consultation:  Severe aortic stenosis  HPI:  The patient is a 70 year old gentleman with a history of hypertension, hyperlipidemia, diabetes with peripheral neuropathy, paroxysmal atrial fibrillation status post ablation on Eliquis, OSA intolerant of CPAP, prostate cancer status post radioactive seed implant in April 2022, and aortic stenosis.  He has been followed by Dr. Tamala Julian and an echo in March 2022 showed an increase in the mean gradient to 31.6 mmHg with a peak gradient of 56 mmHg.  Valve area was 1.2 cm.  His most recent echo on 07/20/2021 showed further progression to severe aortic stenosis with a mean gradient of 44 mmHg and a peak gradient of 82 mmHg.  Dimensionless index is 0.2 with a valve area of 0.98 cm.  Left ventricular ejection fraction is 60 to 65% with moderate asymmetric LVH of the basal-septal segment with grade 1 diastolic dysfunction.  There is mild mitral valve regurgitation.  He subsequently underwent cardiac catheterization on 08/04/2021 showing mild nonobstructive coronary disease with normal right heart pressures.  The patient is here today with his wife.  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  He retired about 6 years ago.  He has noted progressive exertional fatigue and shortness of breath over the past year.  He used to walk every day but tires easily now.  He has some episodes of lightheadedness with bending over.  He denies any chest pressure or pain.  He has had mild peripheral edema.  He sees a Pharmacist, community every 6 months and has had no issues.  Last visit was in 10/22.  Past  Medical History:  Diagnosis Date   Anxiety    BPH (benign prostatic hyperplasia)    BPH without obstruction/lower urinary tract symptoms    Depression    Diabetic peripheral neuropathy ([place]) 01/07/2019   ED (erectile dysfunction)    Essential hypertension    GERD (gastroesophageal reflux disease)    Hiatal hernia    History of chronic bronchitis    per pt used inhaler as needed   History of DVT of lower extremity 2001   s/p  left TKA completed blood thinner treatment,  per pt no previous clot and none since   History of gastric polyp    per pt benign   History of kidney stones    Hyperlipidemia    Mild CAD cardiologist--- dr h. Tamala Julian   nuclear study 04-14-2017 normal , low risk, nuclear ef 53%;  cardiac cath 09-19-2018  midRCA 30% stenosis otherwise normal coronaries,  heavy AV calicification with decreased mobility with peak grandiant 32mmHg   Mild dilation of ascending aorta ([place])    per echo 01-13-2021  20mm   Nephrolithiasis    OA (osteoarthritis)    OSA (obstructive sleep apnea)    per pt refused cpap, intolerant;  moderate osa per study in epic 03-08-2015   PAF (paroxysmal atrial fibrillation) ([place])    followed by dr h. 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 and ep ablation 04/ 2016  Peroneal neuropathy at knee, right 12/05/2018   Prostate cancer [facility]) urologist--- dr dahlstedt/  oncology-- dr Tammi Klippel   first dx 03/ 2021,  Gles[place]on 3+4  PSA 6.95   Pulmonary nodule 2019   incidently finding on CTA 09-25-2018, stable   Right carpal tunnel syndrome 01/07/2019   Severe aortic stenosis    Type 2 diabetes mellitus ([place])    followed by pcp   (03-02-2021  pt stated does not check blood sugar at home)   Umbilical hernia     Past Surgical History:  Procedure Laterality Date   ATRIAL FIBRILLATION ABLATION N/A 03/12/2015   Procedure: ATRIAL FIBRILLATION ABLATION;  Surgeon: [name], MD;  Location: [facility] CATH LAB;  Service: Cardiovascular;  Laterality: N/A;   CARDIAC  CATHETERIZATION  1990's   "Dr. Melvern Banker", no blockages per pt   CARDIOVERSION N/A 02/25/2015   Procedure: CARDIOVERSION;  Surgeon: Sueanne Margarita, MD;  Location: [place] ENDOSCOPY;  Service: Cardiovascular;  Laterality: N/A;   CATARACT EXTRACTION W/ INTRAOCULAR LENS  IMPLANT, BILATERAL  2016   COLONOSCOPY  last one 2013  dr Earlean Shawl   CYSTOSCOPY N/A 03/08/2021   Procedure: CYSTOSCOPY BLADDER CALCULI EXTRACTION ;  Surgeon: Franchot Gallo, MD;  Location: [facility];  Service: Urology;  Laterality: N/A;   CYSTOSCOPY WITH RETROGRADE PYELOGRAM, URETEROSCOPY AND STENT PLACEMENT Left 04/24/2015   Procedure: URETHRAL MEATAL DILATION, CYSTOSCOPY WITH LEFT RETROGRADE PYELOGRAM, LEFT URETEROSCOPY, STONE BASKET EXTRACTION,  LEFT DOUBLE J STENT PLACEMENT;  Surgeon: Carolan Clines, MD;  Location: [facility]S;  Service: Urology;  Laterality: Left;   EP IMPLANTABLE DEVICE N/A 09/15/2015   Procedure: Loop Recorder Insertion;  Surgeon: [name], MD;  Location: [place] CV LAB;  Service: Cardiovascular;  Laterality: N/A;   ESOPHAGOGASTRODUODENOSCOPY  last one 2017   EXTRACORPOREAL SHOCK WAVE LITHOTRIPSY Bilateral 04/02/2020   Procedure: EXTRACORPOREAL SHOCK WAVE LITHOTRIPSY (ESWL);  Surgeon: Cleon Gustin, MD;  Location: [facility];  Service: Urology;  Laterality: Bilateral;   EXTRACORPOREAL SHOCK WAVE LITHOTRIPSY Left 04/06/2020   Procedure: EXTRACORPOREAL SHOCK WAVE LITHOTRIPSY (ESWL);  Surgeon: [name], MD;  Location: [facility];  Service: Urology;  Laterality: Left;   EYE SURGERY     HOLMIUM LASER APPLICATION Left 25/63/8937   Procedure: HOLMIUM LASER APPLICATION;  Surgeon: Carolan Clines, MD;  Location: [facility];  Service: Urology;  Laterality: Left;   implantable loopr recorder removal  02/22/2021   MDT LINQ removed in office by Dr Allred   JOINT REPLACEMENT     KNEE ARTHROSCOPY Bilateral "multiple times"   LEFT HEART CATH AND CORONARY  ANGIOGRAPHY N/A 09/19/2018   Procedure: LEFT HEART CATH AND CORONARY ANGIOGRAPHY;  Surgeon: Belva Crome, MD;  Location: [place] CV LAB;  Service: Cardiovascular;  Laterality: N/A;   PERCUTANEOUS NEPHROSTOLITHOTOMY  1980s and 1990s   RADIOACTIVE SEED IMPLANT N/A 03/08/2021   Procedure: RADIOACTIVE SEED IMPLANT/BRACHYTHERAPY IMPLANT;  Surgeon: Franchot Gallo, MD;  Location: [facility];  Service: Urology;  Laterality: N/A;  90 MINS   RIGHT/LEFT HEART CATH AND CORONARY ANGIOGRAPHY N/A 08/04/2021   Procedure: RIGHT/LEFT HEART CATH AND CORONARY ANGIOGRAPHY;  Surgeon: Belva Crome, MD;  Location: [place] CV LAB;  Service: Cardiovascular;  Laterality: N/A;   SKIN BIOPSY Left 05/06/2020   Shave biopsy left posterior hand neurofiberoma.   SPACE OAR INSTILLATION N/A 03/08/2021   Procedure: SPACE OAR INSTILLATION;  Surgeon: Franchot Gallo, MD;  Location: [facility];  Service: Urology;  Laterality: N/A;  TEE WITHOUT CARDIOVERSION N/A 03/12/2015   Procedure: TRANSESOPHAGEAL ECHOCARDIOGRAM (TEE);  Surgeon: Pixie Casino, MD;  Location: [facility];  Service: Cardiovascular;  Laterality: N/A;   TONSILLECTOMY  age 57   TOTAL KNEE ARTHROPLASTY Left 11-22-1999  @[facility]    TOTAL KNEE ARTHROPLASTY Right 10/30/2017   Procedure: RIGHT TOTAL KNEE ARTHROPLASTY;  Surgeon: Paralee Cancel, MD;  Location: [facility];  Service: Orthopedics;  Laterality: Right;  90 mins   UMBILICAL HERNIA REPAIR N/A 07/05/2021   Procedure: REPAIR INCARCERATED UMBILICAL HERNIA WITH MESH;  Surgeon: Georganna Skeans, MD;  Location: [facility];  Service: General;  Laterality: N/A;    Family History  Problem Relation Age of Onset   Heart attack Mother 84       MI   Heart attack Father 72       ? MI versus trauma to head   Healthy Brother    Cancer Maternal Grandfather        had prostate removed   Breast cancer Neg Hx    Colon cancer Neg Hx    Pancreatic cancer Neg Hx    Prostate cancer Neg Hx      Social History   Socioeconomic History   Marital status: Married    Spouse name: Not on file   Number of children: 1   Years of education: Not on file   Highest education level: Some college, no degree  Occupational History   Occupation: Retired  Tobacco Use   Smoking status: Never   Smokeless tobacco: Never  Vaping Use   Vaping Use: Never used  Substance and Sexual Activity   Alcohol use: Yes    Comment: very rare   Drug use: Never   Sexual activity: Yes  Other Topics Concern   Not on file  Social History Narrative   Pt lives in [place] with spouse.  18 yo son is health.  Second son died in car accident.      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 Authorizing Provider  acetaminophen (TYLENOL) 500 MG tablet Take 1,000 mg by mouth every 8 (eight) hours as needed for mild pain.    Yes [provider]  albuterol (VENTOLIN HFA) 108 (90 Base) MCG/ACT inhaler INHALE 2 PUFFS INTO THE LUNGS EVERY 6 HOURS AS NEEDED FOR WHEEZING OR SHORTNESS OF BREATH 09/25/19  Yes Denita Lung, MD  allopurinol (ZYLOPRIM) 300 MG tablet Take 1 tablet (300 mg total) by mouth daily. 08/03/21  Yes Denita Lung, MD  ammonium lactate (LAC-HYDRIN) 12 % lotion Apply 1 application topically daily as needed for dry skin.   Yes [provider]  atorvastatin (LIPITOR) 40 MG tablet Take 1 tablet (40 mg total) by mouth at bedtime. 08/03/21  Yes Denita Lung, MD  buPROPion (WELLBUTRIN XL) 300 MG 24 hr tablet Take 1 tablet (300 mg total) by mouth daily. 08/03/21  Yes Denita Lung, MD  diltiazem  [facility]) 180 MG 24 hr capsule Take 1 capsule (180 mg total) by mouth daily. 03/01/21  Yes Denita Lung, MD  ELIQUIS 5  MG TABS tablet TAKE ONE TABLET BY MOUTH TWICE A DAY 07/12/21  Yes Allred, Jeneen Rinks, MD  finasteride (PROSCAR) 5 MG tablet Take 5 mg by mouth daily.   Yes [provider]  fluticasone (FLONASE) 50 MCG/ACT nasal spray Place 2 sprays into both nostrils daily. 12/08/20  Yes [provider]  HYDROcodone-acetaminophen (NORCO/VICODIN) 5-325 MG tablet Take 1 tablet by mouth every 6 (six) hours as needed for moderate pain. 07/05/21  Yes Georganna Skeans, MD  loratadine (CLARITIN) 10 MG tablet Take 10 mg by mouth daily.   Yes [provider]  LORazepam (ATIVAN) 0.5 MG tablet TAKE 1 TABLET TWICE DAILY AS NEEDED FOR ANXIETY. 03/22/16  Yes Denita Lung, MD  metFORMIN (GLUCOPHAGE) 500 MG tablet Take 1 tablet (500 mg total) by mouth 2 (two) times daily with a meal. 08/03/21  Yes Denita Lung, MD  Multiple Vitamins-Minerals (MULTIVITAMIN WITH MINERALS) tablet Take 1 tablet by mouth daily.   Yes [provider]  olopatadine (PATANOL) 0.1 % ophthalmic solution Place 1 drop into both eyes daily as needed for allergies (tearing).    Yes [provider]  Omega-3 Fatty Acids (FISH OIL) 1000 MG CAPS Take 1,000 mg by mouth daily.   Yes [provider]  omeprazole (PRILOSEC) 20 MG capsule Take 20 mg by mouth daily.   Yes [provider]  sertraline (ZOLOFT) 50 MG tablet Take 1 tablet (50 mg total) by mouth daily. 08/03/21  Yes Denita Lung, MD  sildenafil (VIAGRA) 100 MG tablet Take 1 tablet (100 mg total) by mouth daily as needed for erectile dysfunction. 08/03/21  Yes Denita Lung, MD  tamsulosin (FLOMAX) 0.4 MG CAPS capsule Take 0.8 mg by mouth in the morning and at bedtime.   Yes [provider]  zolpidem (AMBIEN) 5 MG tablet Take 1 tablet (5 mg total) by mouth at bedtime as needed for sleep. 08/03/21  Yes Denita Lung, MD   metoprolol tartrate (LOPRESSOR) 50 MG tablet Take 1 tablet (50 mg total) by mouth once for 1 dose. Take 1-2 hours prior to your CT scans on 08/25/2021. 08/17/21 08/17/21  Sherren Mocha, MD    Current Outpatient Medications  Medication Sig Dispense Refill   acetaminophen (TYLENOL) 500 MG tablet Take 1,000 mg by mouth every 8 (eight) hours as needed for mild pain.      albuterol (VENTOLIN HFA) 108 (90 Base) MCG/ACT inhaler INHALE 2 PUFFS INTO THE LUNGS EVERY 6 HOURS AS NEEDED FOR WHEEZING OR SHORTNESS OF BREATH 54 g 0   allopurinol (ZYLOPRIM) 300 MG tablet Take 1 tablet (300 mg total) by mouth daily. 90 tablet 3   ammonium lactate (LAC-HYDRIN) 12 % lotion Apply 1 application topically daily as needed for dry skin.     atorvastatin (LIPITOR) 40 MG tablet Take 1 tablet (40 mg total) by mouth at bedtime. 90 tablet 3   buPROPion (WELLBUTRIN XL) 300 MG 24 hr tablet Take 1 tablet (300 mg total) by mouth daily. 90 tablet 1   diltiazem (TIAZAC) 180 MG 24 hr capsule Take 1 capsule (180 mg total) by mouth daily. 90 capsule 3   ELIQUIS 5 MG TABS tablet TAKE ONE TABLET BY MOUTH TWICE A DAY 180 tablet 1   finasteride (PROSCAR) 5 MG tablet Take 5 mg by mouth daily.     fluticasone (FLONASE) 50 MCG/ACT nasal spray Place 2 sprays into both nostrils daily.     HYDROcodone-acetaminophen (NORCO/VICODIN) 5-325 MG tablet Take 1 tablet by mouth every 6 (  six) hours as needed for moderate pain. 15 tablet 0   loratadine (CLARITIN) 10 MG tablet Take 10 mg by mouth daily.     LORazepam (ATIVAN) 0.5 MG tablet TAKE 1 TABLET TWICE DAILY AS NEEDED FOR ANXIETY. 20 tablet 0   metFORMIN (GLUCOPHAGE) 500 MG tablet Take 1 tablet (500 mg total) by mouth 2 (two) times daily with a meal. 180 tablet 3   Multiple Vitamins-Minerals (MULTIVITAMIN WITH MINERALS) tablet Take 1 tablet by mouth daily.     olopatadine (PATANOL) 0.1 % ophthalmic solution Place 1 drop into both eyes daily as needed for allergies (tearing).      Omega-3 Fatty  Acids (FISH OIL) 1000 MG CAPS Take 1,000 mg by mouth daily.     omeprazole (PRILOSEC) 20 MG capsule Take 20 mg by mouth daily.     sertraline (ZOLOFT) 50 MG tablet Take 1 tablet (50 mg total) by mouth daily. 90 tablet 1   sildenafil (VIAGRA) 100 MG tablet Take 1 tablet (100 mg total) by mouth daily as needed for erectile dysfunction. 20 tablet 1   tamsulosin (FLOMAX) 0.4 MG CAPS capsule Take 0.8 mg by mouth in the morning and at bedtime.     zolpidem (AMBIEN) 5 MG tablet Take 1 tablet (5 mg total) by mouth at bedtime as needed for sleep. 15 tablet 1   metoprolol tartrate (LOPRESSOR) 50 MG tablet Take 1 tablet (50 mg total) by mouth once for 1 dose. Take 1-2 hours prior to your CT scans on 08/25/2021. 1 tablet 0   Current Facility-Administered Medications  Medication Dose Route Frequency Provider Last Rate Last Admin   sodium chloride flush (NS) 0.9 % injection 3 mL  3 mL Intravenous Q12H Dunn, Dayna N, PA-C        Allergies  Allergen Reactions   Oxycodone Itching      Review of Systems:   General:  normal appetite, + decreased energy, no weight gain, no weight loss, no fever  Cardiac:  no chest pain with exertion, no chest pain at rest, +SOB with mild exertion, no resting SOB, no PND, no orthopnea, no palpitations, + arrhythmia, + atrial fibrillation, + LE edema, + dizzy spells, no syncope  Respiratory:  + shortness of breath, no home oxygen, no productive cough, no dry cough, no bronchitis, no wheezing, no hemoptysis, no asthma, no pain with inspiration or cough, + sleep apnea, no CPAP at night  GI:   no difficulty swallowing, no reflux, no frequent heartburn, no hiatal hernia, no abdominal pain, no constipation, no diarrhea, no hematochezia, no hematemesis, no melena  GU:   no dysuria,  no frequency, no urinary tract infection, no hematuria, + enlarged prostate, + kidney stones, no kidney disease  Vascular:  no pain suggestive of claudication, no pain in feet, no leg cramps, no varicose  veins, no DVT, no non-healing foot ulcer  Neuro:   no stroke, no TIA's, no seizures, + headaches, no temporary blindness one eye,  no slurred speech, no peripheral neuropathy, no chronic pain, no instability of gait, no memory/cognitive dysfunction  Musculoskeletal: + arthritis - primarily involving the knees, no joint swelling, no myalgias, no difficulty walking, normal mobility   Skin:   no rash, no itching, no skin infections, no pressure sores or ulcerations  Psych:   + anxiety, + depression, + nervousness, no unusual recent stress  Eyes:   no blurry vision, no floaters, no recent vision changes, no glasses or contacts  ENT:   no hearing loss, no  loose or painful teeth, no dentures, last saw dentist 10/22  Hematologic:  no easy bruising, no abnormal bleeding, no clotting disorder, no frequent epistaxis  Endocrine:  + diabetes, does not check CBG's at home     Physical Exam:   BP 129/66 (BP Location: Right Arm, Patient Position: Sitting)   Pulse 73   Resp 20   Ht 6\' 1"  (1.854 m)   Wt 231 lb (104.8 kg)   SpO2 93% Comment: RA  BMI 30.48 kg/m   General:  well-appearing  HEENT:  Unremarkable, NCAT, PERLA, EOMI  Neck:   no JVD, + transmitted murmur or bruits bilaterally, no adenopathy   Chest:   clear to auscultation, symmetrical breath sounds, no wheezes, no rhonchi   CV:   RRR, 3/6 systolic murmur RSB, no diastolic murmur  Abdomen:  soft, non-tender, no masses   Extremities:  warm, well-perfused, pulses palpable at ankle, no lower extremity edema  Rectal/GU  Deferred  Neuro:   Grossly non-focal and symmetrical throughout  Skin:   Clean and dry, no rashes, no breakdown  Diagnostic Tests:  ECHOCARDIOGRAM REPORT         Patient Name:   Jeremy Tucker Date of Exam: 07/20/2021  Medical Rec #:  [id number]     Height:       72.0 in  Accession #:    [id number]    Weight:       235.0 lb  Date of Birth:  01/25/1951     BSA:          2.282 m  Patient Age:    66 years      BP:            151/78 mmHg  Patient Gender: M             HR:           70 bpm.  Exam Location:  [address]   Procedure: 2D Echo, Cardiac Doppler and Color Doppler   Indications:    I35.0 AS     History:        Patient has prior history of Echocardiogram examinations,  most                  recent 01/13/2021. AS, Arrythmias:Atrial Fibrillation; Risk                  Factors:Obesity, Hypertension, Diabetes and Dyslipidemia.     Sonographer:    Coralyn Helling RDCS  Referring Phys: [place]     1. Left ventricular ejection fraction, by estimation, is 60 to 65%. The  left ventricle has normal function. The left ventricle has no regional  wall motion abnormalities. There is moderate asymmetric left ventricular  hypertrophy of the basal-septal  segment. Left ventricular diastolic parameters are consistent with Grade I  diastolic dysfunction (impaired relaxation).   2. Right ventricular systolic function is normal. The right ventricular  size is normal. Tricuspid regurgitation signal is inadequate for assessing  PA pressure.   3. Left atrial size was moderately dilated.   4. The mitral valve is grossly normal. Mild mitral valve regurgitation.   5. The aortic valve is tricuspid. There is moderate calcification of the  aortic valve. Aortic valve regurgitation is mild. Severe aortic valve  stenosis. Aortic valve area, by VTI measures 0.98 cm. Aortic valve mean  gradient measures 44.0 mmHg. Aortic  valve Vmax measures 4.54 m/s. Peak gradient of 82 mmHg. DI is  0.20.   6. The inferior vena cava is normal in size with <50% respiratory  variability, suggesting right atrial pressure of 8 mmHg.   Comparison(s): Changes from prior study are noted. 01/13/2021: LVEF 60-65%,  moderate to severe AS - mean gradient 37 mmHg.   FINDINGS   Left Ventricle: Left ventricular ejection fraction, by estimation, is 60  to 65%. The left ventricle has normal function. The left ventricle has no   regional wall motion abnormalities. The left ventricular internal cavity  size was normal in size. There is   moderate asymmetric left ventricular hypertrophy of the basal-septal  segment. Left ventricular diastolic parameters are consistent with Grade I  diastolic dysfunction (impaired relaxation). Indeterminate filling  pressures.   Right Ventricle: The right ventricular size is normal. No increase in  right ventricular wall thickness. Right ventricular systolic function is  normal. Tricuspid regurgitation signal is inadequate for assessing PA  pressure.   Left Atrium: Left atrial size was moderately dilated.   Right Atrium: Right atrial size was normal in size.   Pericardium: There is no evidence of pericardial effusion.   Mitral Valve: The mitral valve is grossly normal. Mild mitral valve  regurgitation.   Tricuspid Valve: The tricuspid valve is grossly normal. Tricuspid valve  regurgitation is trivial.   Aortic Valve: The right coronary cusp appears immobile. The aortic valve  is tricuspid. There is moderate calcification of the aortic valve. Aortic  valve regurgitation is mild. Severe aortic stenosis is present. Aortic  valve mean gradient measures 44.0  mmHg. Aortic valve peak gradient measures 82.4 mmHg. Aortic valve area, by  VTI measures 0.98 cm.   Pulmonic Valve: The pulmonic valve was grossly normal. Pulmonic valve  regurgitation is trivial.   Aorta: The aortic root and ascending aorta are structurally normal, with  no evidence of dilitation.   Venous: The inferior vena cava is normal in size with less than 50%  respiratory variability, suggesting right atrial pressure of 8 mmHg.   IAS/Shunts: No atrial level shunt detected by color flow Doppler.      LEFT VENTRICLE  PLAX 2D  LVIDd:         4.70 cm  Diastology  LVIDs:         3.30 cm  LV e' medial:    7.29 cm/s  LV PW:         1.10 cm  LV E/e' medial:  15.5  LV IVS:        1.90 cm  LV e' lateral:   10.20  cm/s  LVOT diam:     2.50 cm  LV E/e' lateral: 11.1  LV SV:         92  LV SV Index:   40  LVOT Area:     4.91 cm      RIGHT VENTRICLE         IVC  TAPSE (M-mode): 2.1 cm  IVC diam: 2.00 cm   LEFT ATRIUM              Index       RIGHT ATRIUM           Index  LA diam:        5.40 cm  2.37 cm/m  RA Pressure: 3.00 mmHg  LA Vol (A2C):   109.0 ml 47.77 ml/m RA Area:     12.30 cm  LA Vol (A4C):   94.1 ml  41.24 ml/m RA Volume:   26.00 ml  11.39 ml/m  LA Biplane Vol: 103.0 ml 45.14 ml/m   AORTIC VALVE  AV Area (Vmax):    1.09 cm  AV Area (Vmean):   1.00 cm  AV Area (VTI):     0.98 cm  AV Vmax:           454.00 cm/s  AV Vmean:          304.000 cm/s  AV VTI:            0.934 m  AV Peak Grad:      82.4 mmHg  AV Mean Grad:      44.0 mmHg  LVOT Vmax:         101.00 cm/s  LVOT Vmean:        62.000 cm/s  LVOT VTI:          0.187 m  LVOT/AV VTI ratio: 0.20     AORTA  Ao Root diam: 3.20 cm  Ao Asc diam:  3.40 cm   MITRAL VALVE                TRICUSPID VALVE  MV Area (PHT): 3.19 cm     Estimated RAP:  3.00 mmHg  MV Decel Time: 238 msec  MV E velocity: 113.00 cm/s  SHUNTS  MV A velocity: 103.00 cm/s  Systemic VTI:  0.19 m  MV E/A ratio:  1.10         Systemic Diam: 2.50 cm   Lyman Bishop MD  Electronically signed by Lyman Bishop MD  Signature Date/Time: 07/20/2021/1:10:02 PM         Final     Physicians  Panel Physicians Referring Physician Case Authorizing Physician  Belva Crome, MD (Primary)     Procedures  RIGHT/LEFT HEART CATH AND CORONARY ANGIOGRAPHY   Conclusion  CONCLUSIONS: Normal left main 40% proximal RCA stenosis.  Right dominant anatomy. Widely patent circumflex. 20 to 30% mid LAD.  The LAD reaches the left ventricular apex. Normal pulmonary artery pressures, mean 23 mmHg. Pulmonary capillary wedge pressure mean, 13 mmHg.   RECOMMENDATIONS: Referred to the aortic valve clinic for consideration of TAVR versus SAVR   Surgeon Notes     08/04/2021  1:17 PM CV Procedure signed by Belva Crome, MD   Procedural Details  Technical Details The right radial area was sterilely prepped and draped. Intravenous sedation with Versed and fentanyl was administered. 1% Xylocaine was infiltrated to achieve local analgesia. Using real-time vascular ultrasound, a double wall stick with an angiocath was utilized to obtain intra-arterial access. A VUS image was saved for the permanent record.The modified Seldinger technique was used to place a 28F " Slender" sheath in the right radial artery. Weight based heparin was administered. Coronary angiography was done using 5 F catheters. Right coronary angiography was performed with a JR4.  Angulation and tortuosity of the subclavian/innominate/aortic path, led to significant difficulty with catheter manipulation and control.  Using a deep held breath, the right coronary was visualized demonstrating less than 50% stenosis in the proximal vessel.  Likewise the left coronary was difficult to cannulate due to tortuosity but was eventually successful using a JL 3.5 cm diagnostic catheter.  Left ventricular hemodymic recordings and angiography was not performed.  Serial echocardiogram studies have demonstrated progression and now severe calcific aortic stenosis.  The risk of crossing the valve and having embolic complications outweighed any benefit of obtaining a left ventricular end-diastolic pressure.   Right heart catheterization was performed by exchanging a previously placed antecubital IV angio-cath  for a 5 [place] Slender sheath. 1% Xylocaine was used to locally nesthetize the area around the IV site. The IV catheter was wired using an .018 guidewire. The modified Seldinger technique was used to place the 5 [place] sheath. Double glove technique was used to enhance sterility. After sheath insertion, right heart cath was performed using a 5 French balloon tipped catheter and fluoroscopic guidance. Pressures were  recorded in each chamber and in the pulmonary capillary wedge position.. The main pulmonary artery O2 saturation was sampled.   Hemostasis was achieved using a pneumatic band.  During this procedure the patient is administered a total of Versed 1.5 mg and Fentanyl 50 mcg to achieve and maintain moderate conscious sedation.  The patient's heart rate, blood pressure, and oxygen saturation are monitored continuously during the procedure. The period of conscious sedation is 40 minutes, of which I was present face-to-face 100% of this time.  Estimated blood loss <50 mL.   During this procedure medications were administered to achieve and maintain moderate conscious sedation while the patient's heart rate, blood pressure, and oxygen saturation were continuously monitored and I was present face-to-face 100% of this time.   Medications (Filter: Administrations occurring from 1211 to 1320 on 08/04/21)  important  Continuous medications are totaled by the amount administered until 08/04/21 1320.   Heparin (Porcine) in NaCl 2000-0.9 UNIT/L-% SOLN (mL) Total volume:  1,000 mL Date/Time Rate/Dose/Volume Action   08/04/21 1223 1,000 mL Given    midazolam (VERSED) injection (mg) Total dose:  1.5 mg Date/Time Rate/Dose/Volume Action   08/04/21 1224 1 mg Given   1236 0.5 mg Given    fentaNYL (SUBLIMAZE) injection (mcg) Total dose:  50 mcg Date/Time Rate/Dose/Volume Action   08/04/21 1224 25 mcg Given   1236 25 mcg Given    lidocaine (PF) (XYLOCAINE) 1 % injection (mL) Total volume:  4 mL Date/Time Rate/Dose/Volume Action   08/04/21 1237 4 mL Given    Radial Cocktail/Verapamil only (mL) Total volume:  10 mL Date/Time Rate/Dose/Volume Action   08/04/21 1244 10 mL Given    heparin sodium (porcine) injection (Units) Total dose:  5,000 Units Date/Time Rate/Dose/Volume Action   08/04/21 1250 5,000 Units Given    iohexol (OMNIPAQUE) 350 MG/ML injection (mL) Total volume:  65 mL Date/Time  Rate/Dose/Volume Action   08/04/21 1309 65 mL Given    Sedation Time  Sedation Time Physician-1: 43 minutes 19 seconds Contrast  Medication Name Total Dose  iohexol (OMNIPAQUE) 350 MG/ML injection 65 mL   Radiation/Fluoro  Fluoro time: 7.6 (min) DAP: 16.8 (Gycm2) Cumulative Air Kerma: 621.3 (mGy) Complications  Complications documented before study signed (08/04/2021  1:29 PM)   RIGHT/LEFT HEART CATH AND CORONARY ANGIOGRAPHY  None Documented by Belva Crome, MD 08/04/2021  1:28 PM  Date Found: 08/04/2021  Time Range: Intraprocedure       Coronary Findings  Diagnostic Dominance: Right Left Anterior Descending  Prox LAD to Mid LAD lesion is 25% stenosed.  Right Coronary Artery  Prox RCA lesion is 40% stenosed.  Intervention  No interventions have been documented. Right Heart  Right Heart Pressures Hemodynamic findings consistent with pulmonary hypertension.   Left Heart  Aortic Valve There is severe aortic valve stenosis. The aortic valve is calcified.   Coronary Diagrams  Diagnostic Dominance: Right Intervention  Implants     No implant documentation for this case.   Syngo Images   Show images for CARDIAC CATHETERIZATION Images on Long Term Storage   Show images for [place],  Charlies Silvers to Procedure Log  Procedure Log    Hemo Data  Flowsheet Row Most Recent Value  Fick Cardiac Output 5.91 L/min  Fick Cardiac Output Index 2.59 (L/min)/BSA  RA A Wave 10 mmHg  RA V Wave 5 mmHg  RA Mean 4 mmHg  RV Systolic Pressure 32 mmHg  RV Diastolic Pressure 3 mmHg  RV EDP 7 mmHg  PA Systolic Pressure 31 mmHg  PA Diastolic Pressure 18 mmHg  PA Mean 23 mmHg  PW A Wave 16 mmHg  PW V Wave 16 mmHg  PW Mean 13 mmHg  AO Systolic Pressure 485 mmHg  AO Diastolic Pressure 66 mmHg  AO Mean 89 mmHg  QP/QS 1  TPVR Index 8.89 HRUI  TSVR Index 34.41 HRUI  PVR SVR Ratio 0.12  TPVR/TSVR Ratio 0.26   Impression:  ADDENDUM REPORT: 08/25/2021 16:38    ADDENDUM: Please see separate dictation for contemporaneously obtained CTA chest, abdomen and pelvis dated 08/25/2021 for full description of relevant extracardiac findings.     Electronically Signed   By: Vinnie Langton M.D.   On: 08/25/2021 16:38    Addended by Etheleen Mayhew, MD on 08/25/2021  4:41 PM   Study Result  Narrative & Impression  CLINICAL DATA:  Aortic Stenosis   EXAM: Cardiac TAVR CT   TECHNIQUE: The patient was scanned on a Siemens Force 462 slice scanner. A 120 kV retrospective scan was triggered in the ascending thoracic aorta at 140 HU's. Gantry rotation speed was 250 msecs and collimation was .6 mm. No beta blockade or nitro were given. The 3D data set was reconstructed in 5% intervals of the R-R cycle. Systolic and diastolic phases were analyzed on a dedicated work station using MPR, MIP and VRT modes. The patient received 80 cc of contrast.   FINDINGS: Aortic Valve: Tri leaflet AV heavily calcified with score 5130   Aorta: No aneurysm Bovine Arch mild calcific atherosclerosis   Sino-tubular Junction: 28 mm   Ascending Thoracic Aorta: 35 mm   Aortic Arch: 29 mm   Descending Thoracic Aorta: 26 mm   Sinus of Valsalva Measurements:   Non-coronary: 33.7 mm   Right - coronary: 30.4 mm sinus height 17.7 mm   Left -   coronary: 30.7 mm sinus height 21.2 mm   Coronary Artery Height above Annulus:   Left Main: 13.2 mm above annulus   Right Coronary: 14.1 mm above annulus   Virtual Basal Annulus Measurements:   Maximum / Minimum Diameter: 32.1 mm x 25.9 mm   Perimeter: 94.1 mm   Area: 669 mm2   Coronary Arteries: Sufficient height above annulus for deployment   Optimum Fluoroscopic Angle for Delivery: LAO 14 Caudal 14 degrees   IMPRESSION: 1. Tri-leaflet AV with calcium score 5130   2. Optimum angiographic angle for deployment LAO 14 Caudal 14 degrees   3.  Annular area of 669 mm2 upper limit of 29 mm Sapien 3 valve   4.  Concern for marked annular and subvalvular calcium extending across inter-valvular fibrosa to mid anterior mitral leaflet   5.  Coronary arteries sufficient height above annulus for deployment   Jenkins Rouge   Electronically Signed: By: Jenkins Rouge M.D. On: 08/25/2021 15:21    Plan:  This 70 year old gentleman has stage D, severe, symptomatic aortic stenosis with New York Heart Association class II symptoms of exertional fatigue and shortness of breath consistent with chronic diastolic congestive heart failure.  I have personally reviewed his 2D echocardiogram, cardiac catheterization,  and CTA studies.  His echocardiogram shows a heavily calcified trileaflet aortic valve with restricted mobility.  The mean gradient has increased to 44 mmHg with a valve area of 0.98 cm consistent with severe aortic stenosis.  Left ventricular systolic function is normal with moderate asymmetric LVH and grade 1 diastolic dysfunction.  Cardiac catheterization shows mild nonobstructive coronary disease with normal right heart pressures.  I agree that aortic valve replacement is indicated in this patient for relief of his symptoms and to prevent progressive left ventricular dysfunction.  His gated cardiac CTA shows a trileaflet aortic valve with a calcium score of 5130 with marked annular and subvalvular calcium extending across the antral valvular fibrosa to the midportion of the anterior mitral leaflet.  With this degree of calcification I think transcatheter aortic valve replacement would have a significant risk of paravalvular leak.  I think open surgical aortic valve replacement would have the best long-term result for him.  I discussed both TAVR and open surgical aortic valve replacement with him and his wife including the risk and benefits of both.  He is in agreement with proceeding with open surgical aortic valve replacement using a bioprosthetic valve.  I discussed the operative procedure with the patient and  family including alternatives, benefits and risks; including but not limited to bleeding, blood transfusion, infection, stroke, myocardial infarction, graft failure, heart block requiring a permanent pacemaker, organ dysfunction, and death.  TOLUWANI YADAV understands and agrees to proceed.  We will schedule surgery for 09/20/2021.  I instructed him to discontinue his Eliquis as of today and he will hold his metformin for 48 hours preoperatively.  I spent 60 minutes performing this consultation and > 50% of this time was spent face to face counseling and coordinating the care of this patient's severe symptomatic aortic stenosis.   [name], MD 09/15/2021

## 2021-09-16 NOTE — Progress Notes (Signed)
PCP - Dr. Jill Alexanders Cardiologist - Dr. Tamala Julian  PPM/ICD - n/a  Chest x-ray - 09/16/21 in PAT EKG - 09/16/21 in PAT Stress Test - 04/26/17  ECHO - 07/20/21 Cardiac Cath - 08/04/21  Sleep Study - 03/13/15 CPAP - does not wear  Fasting Blood Sugar - Pt reports that he doesn't check it; 125 in PAT  Blood Thinner Instructions: Per pt, LD of Eliquis 09/15/21 per instruction of provider Aspirin Instructions: As of today, STOP taking any Aspirin (unless otherwise instructed by your surgeon) Aleve, Naproxen, Ibuprofen, Motrin, Advil, Goody's, BC's, all herbal medications, fish oil, and all vitamins.  NPO at midnight  COVID TEST- 09/16/21 in PAT; pt instructed to wear mask if he has to go out or be around people that don't live in household  Anesthesia review: yes, cardiac hx  Patient denies shortness of breath, fever, cough and chest pain at PAT appointment   All instructions explained to the patient, with a verbal understanding of the material. Patient agrees to go over the instructions while at home for a better understanding. Patient also instructed to self quarantine after being tested for COVID-19. The opportunity to ask questions was provided.

## 2021-09-16 NOTE — Progress Notes (Signed)
VASCULAR LAB    Pre CABG Dopplers have been performed.  See CV proc for preliminary results.   Chaniqua Brisby, RVT 09/16/2021, 11:42 AM

## 2021-09-16 NOTE — Pre-Procedure Instructions (Signed)
Surgical Instructions    Your procedure is scheduled on Monday 09/20/21.  Report to [facility] Main Entrance "A" at 11:00 A.M., then check in with the Admitting office.  Call this number if you have problems the morning of surgery:  [phone]   If you have any questions prior to your surgery date call [phone]: Open Monday-Friday 8am-4pm    Remember:  Do not eat or drink after midnight the night before your surgery    Take these medicines the morning of surgery with A SIP OF WATER:  allopurinol (ZYLOPRIM)  buPROPion (WELLBUTRIN XL) diltiazem (TIAZAC)  finasteride (PROSCAR) fluticasone (FLONASE) loratadine (CLARITIN) omeprazole (PRILOSEC) sertraline (ZOLOFT) tamsulosin (FLOMAX)     Take these medicines the morning of surgery with A SIP OF WATER AS NEEDED: acetaminophen (TYLENOL) albuterol (VENTOLIN HFA)  HYDROcodone-acetaminophen (NORCO/VICODIN) LORazepam (ATIVAN)  olopatadine (PATANOL) 0.1 % ophthalmic solution   Follow your surgeon's instructions on when to stop ELIQUIS.  If no instructions were given by your surgeon then you will need to call the office to get those instructions.     As of today, STOP taking any Aspirin (unless otherwise instructed by your surgeon) Aleve, Naproxen, Ibuprofen, Motrin, Advil, Goody's, BC's, all herbal medications, fish oil, and all vitamins.    WHAT DO I DO ABOUT MY DIABETES MEDICATION?   Do not take metFORMIN (GLUCOPHAGE) the morning of surgery.  HOW TO MANAGE YOUR DIABETES BEFORE AND AFTER SURGERY  Why is it important to control my blood sugar before and after surgery? Improving blood sugar levels before and after surgery helps healing and can limit problems. A way of improving blood sugar control is eating a healthy diet by:  Eating less sugar and carbohydrates  Increasing activity/exercise  Talking with your doctor about reaching your blood sugar goals High blood sugars (greater than 180 mg/dL) can raise your risk of  infections and slow your recovery, so you will need to focus on controlling your diabetes during the weeks before surgery. Make sure that the doctor who takes care of your diabetes knows about your planned surgery including the date and location.  How do I manage my blood sugar before surgery? Check your blood sugar at least 4 times a day, starting 2 days before surgery, to make sure that the level is not too high or low.  Check your blood sugar the morning of your surgery when you wake up and every 2 hours until you get to the Short Stay unit.  If your blood sugar is less than 70 mg/dL, you will need to treat for low blood sugar: Treat a low blood sugar (less than 70 mg/dL) with  cup of clear juice (cranberry or apple), 4 glucose tablets, OR glucose gel. Recheck blood sugar in 15 minutes after treatment (to make sure it is greater than 70 mg/dL). If your blood sugar is not greater than 70 mg/dL on recheck, call [phone] for further instructions. Report your blood sugar to the short stay nurse when you get to Short Stay.  If you are admitted to the hospital after surgery: Your blood sugar will be checked by the staff and you will probably be given insulin after surgery (instead of oral diabetes medicines) to make sure you have good blood sugar levels. The goal for blood sugar control after surgery is 80-180 mg/dL.   After your COVID test   You are not required to quarantine however you are required to wear a well-fitting mask when you are out and around people not  in your household.  If your mask becomes wet or soiled, replace with a new one.  Wash your hands often with soap and water for 20 seconds or clean your hands with an alcohol-based hand sanitizer that contains at least 60% alcohol.  Do not share personal items.  Notify your provider: if you are in close contact with someone who has COVID  or if you develop a fever of 100.4 or greater, sneezing, cough, sore throat, shortness  of breath or body aches.           Do not wear jewelry Do not wear lotions, powders, colognes, or deodorant. Men may shave face and neck. Do not bring valuables to the hospital.              [facility] is not responsible for any belongings or valuables.  Do NOT Smoke (Tobacco/Vaping)  24 hours prior to your procedure  If you use a CPAP at night, you may bring your mask for your overnight stay.   Contacts, glasses, hearing aids, dentures or partials may not be worn into surgery, please bring cases for these belongings   For patients admitted to the hospital, discharge time will be determined by your treatment team.   Patients discharged the day of surgery will not be allowed to drive home, and someone needs to stay with them for 24 hours.  NO VISITORS WILL BE ALLOWED IN PRE-OP WHERE PATIENTS ARE PREPPED FOR SURGERY.  ONLY 1 SUPPORT PERSON MAY BE PRESENT IN THE WAITING ROOM WHILE YOU ARE IN SURGERY.  IF YOU ARE TO BE ADMITTED, ONCE YOU ARE IN YOUR ROOM YOU WILL BE ALLOWED TWO (2) VISITORS. 1 (ONE) VISITOR MAY STAY OVERNIGHT BUT MUST ARRIVE TO THE ROOM BY 8pm.  Minor children may have two parents present. Special consideration for safety and communication needs will be reviewed on a case by case basis.  Special instructions:    Oral Hygiene is also important to reduce your risk of infection.  Remember - BRUSH YOUR TEETH THE MORNING OF SURGERY WITH YOUR REGULAR TOOTHPASTE   [facility]- Preparing For Surgery  Before surgery, you can play an important role. Because skin is not sterile, your skin needs to be as free of germs as possible. You can reduce the number of germs on your skin by washing with CHG (chlorahexidine gluconate) Soap before surgery.  CHG is an antiseptic cleaner which kills germs and bonds with the skin to continue killing germs even after washing.     Please do not use if you have an allergy to CHG or antibacterial soaps. If your skin becomes reddened/irritated stop  using the CHG.  Do not shave (including legs and underarms) for at least 48 hours prior to first CHG shower. It is OK to shave your face.  Please follow these instructions carefully.     Shower the NIGHT BEFORE SURGERY and the MORNING OF SURGERY with CHG Soap.   If you chose to wash your hair, wash your hair first as usual with your normal shampoo. After you shampoo, rinse your hair and body thoroughly to remove the shampoo.  Then [employer] and genitals (private parts) with your normal soap and rinse thoroughly to remove soap.  After that Use CHG Soap as you would any other liquid soap. You can apply CHG directly to the skin and wash gently with a scrungie or a clean washcloth.   Apply the CHG Soap to your body ONLY FROM THE NECK DOWN.  Do not use on open wounds or open sores. Avoid contact with your eyes, ears, mouth and genitals (private parts). Wash Face and genitals (private parts)  with your normal soap.   Wash thoroughly, paying special attention to the area where your surgery will be performed.  Thoroughly rinse your body with warm water from the neck down.  DO NOT shower/wash with your normal soap after using and rinsing off the CHG Soap.  Pat yourself dry with a CLEAN TOWEL.  Wear CLEAN PAJAMAS to bed the night before surgery  Place CLEAN SHEETS on your bed the night before your surgery  DO NOT SLEEP WITH PETS.   Day of Surgery:  Take a shower with CHG soap. Wear Clean/Comfortable clothing the morning of surgery Do not apply any deodorants/lotions.   Remember to brush your teeth WITH YOUR REGULAR TOOTHPASTE.   Please read over the following fact sheets that you were given.

## 2021-09-17 LAB — SARS CORONAVIRUS 2 (TAT 6-24 HRS): SARS Coronavirus 2: NEGATIVE

## 2021-09-17 MED ORDER — CEFAZOLIN SODIUM-DEXTROSE 2-4 GM/100ML-% IV SOLN
2.0000 g | INTRAVENOUS | Status: AC
  Administered 2021-09-20: 2 g via INTRAVENOUS
  Filled 2021-09-17: qty 100

## 2021-09-17 MED ORDER — INSULIN REGULAR(HUMAN) IN NACL 100-0.9 UT/100ML-% IV SOLN
INTRAVENOUS | Status: AC
  Administered 2021-09-20: 5.5 [IU]/h via INTRAVENOUS
  Filled 2021-09-17: qty 100

## 2021-09-17 MED ORDER — EPINEPHRINE HCL 5 MG/250ML IV SOLN IN NS
0.0000 ug/min | INTRAVENOUS | Status: DC
  Filled 2021-09-17: qty 250

## 2021-09-17 MED ORDER — NITROGLYCERIN IN D5W 200-5 MCG/ML-% IV SOLN
2.0000 ug/min | INTRAVENOUS | Status: AC
  Administered 2021-09-20: 5 ug/min via INTRAVENOUS
  Filled 2021-09-17: qty 250

## 2021-09-17 MED ORDER — TRANEXAMIC ACID (OHS) BOLUS VIA INFUSION
15.0000 mg/kg | INTRAVENOUS | Status: AC
  Administered 2021-09-20: 1572 mg via INTRAVENOUS
  Filled 2021-09-17: qty 1572

## 2021-09-17 MED ORDER — DEXMEDETOMIDINE HCL IN NACL 400 MCG/100ML IV SOLN
0.1000 ug/kg/h | INTRAVENOUS | Status: AC
Start: 2021-09-20 — End: 2021-09-20
  Administered 2021-09-20: .7 ug/kg/h via INTRAVENOUS
  Filled 2021-09-17: qty 100

## 2021-09-17 MED ORDER — MILRINONE LACTATE IN DEXTROSE 20-5 MG/100ML-% IV SOLN
0.3000 ug/kg/min | INTRAVENOUS | Status: DC
  Filled 2021-09-17: qty 100

## 2021-09-17 MED ORDER — HEPARIN 30,000 UNITS/1000 ML (OHS) CELLSAVER SOLUTION
Status: DC
  Filled 2021-09-17: qty 1000

## 2021-09-17 MED ORDER — TRANEXAMIC ACID (OHS) PUMP PRIME SOLUTION
2.0000 mg/kg | INTRAVENOUS | Status: DC
  Filled 2021-09-17: qty 2.1

## 2021-09-17 MED ORDER — POTASSIUM CHLORIDE 2 MEQ/ML IV SOLN
80.0000 meq | INTRAVENOUS | Status: DC
  Filled 2021-09-17: qty 40

## 2021-09-17 MED ORDER — NOREPINEPHRINE 4 MG/250ML-% IV SOLN
0.0000 ug/min | INTRAVENOUS | Status: DC
  Filled 2021-09-17: qty 250

## 2021-09-17 MED ORDER — VANCOMYCIN HCL 1500 MG/300ML IV SOLN
1500.0000 mg | INTRAVENOUS | Status: AC
  Administered 2021-09-20: 1500 mg via INTRAVENOUS
  Filled 2021-09-17: qty 300

## 2021-09-17 MED ORDER — MANNITOL 20 % IV SOLN
INTRAVENOUS | Status: DC
  Filled 2021-09-17: qty 13

## 2021-09-17 MED ORDER — PLASMA-LYTE A IV SOLN
INTRAVENOUS | Status: DC
  Filled 2021-09-17: qty 5

## 2021-09-17 MED ORDER — TRANEXAMIC ACID 1000 MG/10ML IV SOLN
1.5000 mg/kg/h | INTRAVENOUS | Status: AC
  Administered 2021-09-20: 1.5 mg/kg/h via INTRAVENOUS
  Filled 2021-09-17: qty 25

## 2021-09-17 MED ORDER — PHENYLEPHRINE HCL-NACL 20-0.9 MG/250ML-% IV SOLN
30.0000 ug/min | INTRAVENOUS | Status: AC
  Administered 2021-09-20: 40 ug/min via INTRAVENOUS
  Filled 2021-09-17: qty 250

## 2021-09-20 ENCOUNTER — Inpatient Hospital Stay (HOSPITAL_COMMUNITY): Payer: PPO

## 2021-09-20 ENCOUNTER — Other Ambulatory Visit: Payer: Self-pay

## 2021-09-20 ENCOUNTER — Inpatient Hospital Stay (HOSPITAL_COMMUNITY): Payer: PPO | Admitting: Anesthesiology

## 2021-09-20 ENCOUNTER — Encounter (HOSPITAL_COMMUNITY): Payer: Self-pay | Admitting: Surgery

## 2021-09-20 ENCOUNTER — Inpatient Hospital Stay (HOSPITAL_COMMUNITY)
Admission: RE | Admit: 2021-09-20 | Discharge: 2021-09-24 | DRG: 220 | Disposition: A | Discharge status: Home / Self Care | Payer: PPO | Attending: Surgery | Admitting: Surgery

## 2021-09-20 ENCOUNTER — Encounter (HOSPITAL_COMMUNITY)
Admission: RE | Disposition: A | Discharge status: Home / Self Care | Payer: Self-pay | Source: Home / Self Care | Attending: Surgery

## 2021-09-20 DIAGNOSIS — I517 Cardiomegaly: Secondary | ICD-10-CM | POA: Diagnosis not present

## 2021-09-20 DIAGNOSIS — Z8042 Family history of malignant neoplasm of prostate: Secondary | ICD-10-CM | POA: Diagnosis not present

## 2021-09-20 DIAGNOSIS — Z885 Allergy status to narcotic agent status: Secondary | ICD-10-CM

## 2021-09-20 DIAGNOSIS — N400 Enlarged prostate without lower urinary tract symptoms: Secondary | ICD-10-CM | POA: Diagnosis not present

## 2021-09-20 DIAGNOSIS — E785 Hyperlipidemia, unspecified: Secondary | ICD-10-CM | POA: Diagnosis present

## 2021-09-20 DIAGNOSIS — J9811 Atelectasis: Secondary | ICD-10-CM | POA: Diagnosis not present

## 2021-09-20 DIAGNOSIS — J939 Pneumothorax, unspecified: Secondary | ICD-10-CM

## 2021-09-20 DIAGNOSIS — I358 Other nonrheumatic aortic valve disorders: Secondary | ICD-10-CM | POA: Diagnosis not present

## 2021-09-20 DIAGNOSIS — Z86718 Personal history of other venous thrombosis and embolism: Secondary | ICD-10-CM | POA: Diagnosis not present

## 2021-09-20 DIAGNOSIS — G4733 Obstructive sleep apnea (adult) (pediatric): Secondary | ICD-10-CM | POA: Diagnosis not present

## 2021-09-20 DIAGNOSIS — D62 Acute posthemorrhagic anemia: Secondary | ICD-10-CM | POA: Diagnosis not present

## 2021-09-20 DIAGNOSIS — Z79899 Other long term (current) drug therapy: Secondary | ICD-10-CM

## 2021-09-20 DIAGNOSIS — E1142 Type 2 diabetes mellitus with diabetic polyneuropathy: Secondary | ICD-10-CM | POA: Diagnosis not present

## 2021-09-20 DIAGNOSIS — Z8546 Personal history of malignant neoplasm of prostate: Secondary | ICD-10-CM

## 2021-09-20 DIAGNOSIS — Z87442 Personal history of urinary calculi: Secondary | ICD-10-CM | POA: Diagnosis not present

## 2021-09-20 DIAGNOSIS — I350 Nonrheumatic aortic (valve) stenosis: Secondary | ICD-10-CM | POA: Diagnosis not present

## 2021-09-20 DIAGNOSIS — I480 Paroxysmal atrial fibrillation: Secondary | ICD-10-CM | POA: Diagnosis not present

## 2021-09-20 DIAGNOSIS — Z923 Personal history of irradiation: Secondary | ICD-10-CM

## 2021-09-20 DIAGNOSIS — G4700 Insomnia, unspecified: Secondary | ICD-10-CM | POA: Diagnosis not present

## 2021-09-20 DIAGNOSIS — K219 Gastro-esophageal reflux disease without esophagitis: Secondary | ICD-10-CM | POA: Diagnosis present

## 2021-09-20 DIAGNOSIS — Z7901 Long term (current) use of anticoagulants: Secondary | ICD-10-CM | POA: Diagnosis not present

## 2021-09-20 DIAGNOSIS — Z96651 Presence of right artificial knee joint: Secondary | ICD-10-CM | POA: Diagnosis not present

## 2021-09-20 DIAGNOSIS — I2510 Atherosclerotic heart disease of native coronary artery without angina pectoris: Secondary | ICD-10-CM | POA: Diagnosis not present

## 2021-09-20 DIAGNOSIS — J90 Pleural effusion, not elsewhere classified: Secondary | ICD-10-CM

## 2021-09-20 DIAGNOSIS — Z952 Presence of prosthetic heart valve: Secondary | ICD-10-CM

## 2021-09-20 DIAGNOSIS — F419 Anxiety disorder, unspecified: Secondary | ICD-10-CM | POA: Diagnosis present

## 2021-09-20 DIAGNOSIS — J984 Other disorders of lung: Secondary | ICD-10-CM | POA: Diagnosis not present

## 2021-09-20 DIAGNOSIS — Z8249 Family history of ischemic heart disease and other diseases of the circulatory system: Secondary | ICD-10-CM | POA: Diagnosis not present

## 2021-09-20 DIAGNOSIS — F32A Depression, unspecified: Secondary | ICD-10-CM | POA: Diagnosis not present

## 2021-09-20 DIAGNOSIS — I272 Pulmonary hypertension, unspecified: Secondary | ICD-10-CM | POA: Diagnosis present

## 2021-09-20 DIAGNOSIS — I083 Combined rheumatic disorders of mitral, aortic and tricuspid valves: Secondary | ICD-10-CM | POA: Diagnosis not present

## 2021-09-20 DIAGNOSIS — I10 Essential (primary) hypertension: Secondary | ICD-10-CM | POA: Diagnosis present

## 2021-09-20 DIAGNOSIS — E8779 Other fluid overload: Secondary | ICD-10-CM | POA: Diagnosis not present

## 2021-09-20 HISTORY — PX: AORTIC VALVE REPLACEMENT: SHX41

## 2021-09-20 HISTORY — PX: TEE WITHOUT CARDIOVERSION: SHX5443

## 2021-09-20 HISTORY — PX: CLIPPING OF ATRIAL APPENDAGE: SHX5773

## 2021-09-20 LAB — POCT I-STAT, CHEM 8
BUN: 10 mg/dL (ref 8–23)
BUN: 9 mg/dL (ref 8–23)
BUN: 9 mg/dL (ref 8–23)
BUN: 10 mg/dL (ref 8–23)
BUN: 10 mg/dL (ref 8–23)
Calcium, Ion: 1.18 mmol/L (ref 1.15–1.40)
Calcium, Ion: 1.19 mmol/L (ref 1.15–1.40)
Calcium, Ion: 1.09 mmol/L — ABNORMAL LOW (ref 1.15–1.40)
Calcium, Ion: 1.1 mmol/L — ABNORMAL LOW (ref 1.15–1.40)
Calcium, Ion: 1.07 mmol/L — ABNORMAL LOW (ref 1.15–1.40)
Chloride: 101 mmol/L (ref 98–111)
Chloride: 102 mmol/L (ref 98–111)
Chloride: 100 mmol/L (ref 98–111)
Chloride: 99 mmol/L (ref 98–111)
Chloride: 100 mmol/L (ref 98–111)
Creatinine, Ser: 0.5 mg/dL — ABNORMAL LOW (ref 0.61–1.24)
Creatinine, Ser: 0.6 mg/dL — ABNORMAL LOW (ref 0.61–1.24)
Creatinine, Ser: 0.5 mg/dL — ABNORMAL LOW (ref 0.61–1.24)
Creatinine, Ser: 0.6 mg/dL — ABNORMAL LOW (ref 0.61–1.24)
Creatinine, Ser: 0.6 mg/dL — ABNORMAL LOW (ref 0.61–1.24)
Glucose, Bld: 162 mg/dL — ABNORMAL HIGH (ref 70–99)
Glucose, Bld: 165 mg/dL — ABNORMAL HIGH (ref 70–99)
Glucose, Bld: 128 mg/dL — ABNORMAL HIGH (ref 70–99)
Glucose, Bld: 118 mg/dL — ABNORMAL HIGH (ref 70–99)
Glucose, Bld: 123 mg/dL — ABNORMAL HIGH (ref 70–99)
HCT: 30 % — ABNORMAL LOW (ref 39.0–52.0)
HCT: 36 % — ABNORMAL LOW (ref 39.0–52.0)
HCT: 28 % — ABNORMAL LOW (ref 39.0–52.0)
HCT: 28 % — ABNORMAL LOW (ref 39.0–52.0)
HCT: 32 % — ABNORMAL LOW (ref 39.0–52.0)
Hemoglobin: 10.2 g/dL — ABNORMAL LOW (ref 13.0–17.0)
Hemoglobin: 12.2 g/dL — ABNORMAL LOW (ref 13.0–17.0)
Hemoglobin: 9.5 g/dL — ABNORMAL LOW (ref 13.0–17.0)
Hemoglobin: 9.5 g/dL — ABNORMAL LOW (ref 13.0–17.0)
Hemoglobin: 10.9 g/dL — ABNORMAL LOW (ref 13.0–17.0)
Potassium: 3.6 mmol/L (ref 3.5–5.1)
Potassium: 3.8 mmol/L (ref 3.5–5.1)
Potassium: 4 mmol/L (ref 3.5–5.1)
Potassium: 4.5 mmol/L (ref 3.5–5.1)
Potassium: 4.1 mmol/L (ref 3.5–5.1)
Sodium: 138 mmol/L (ref 135–145)
Sodium: 139 mmol/L (ref 135–145)
Sodium: 137 mmol/L (ref 135–145)
Sodium: 136 mmol/L (ref 135–145)
Sodium: 138 mmol/L (ref 135–145)
TCO2: 24 mmol/L (ref 22–32)
TCO2: 24 mmol/L (ref 22–32)
TCO2: 29 mmol/L (ref 22–32)
TCO2: 29 mmol/L (ref 22–32)
TCO2: 27 mmol/L (ref 22–32)

## 2021-09-20 LAB — POCT I-STAT 7, (LYTES, BLD GAS, ICA,H+H)
Acid-Base Excess: 0 mmol/L (ref 0.0–2.0)
Acid-Base Excess: 4 mmol/L — ABNORMAL HIGH (ref 0.0–2.0)
Acid-Base Excess: 3 mmol/L — ABNORMAL HIGH (ref 0.0–2.0)
Acid-Base Excess: 2 mmol/L (ref 0.0–2.0)
Bicarbonate: 25.2 mmol/L (ref 20.0–28.0)
Bicarbonate: 29.8 mmol/L — ABNORMAL HIGH (ref 20.0–28.0)
Bicarbonate: 28.3 mmol/L — ABNORMAL HIGH (ref 20.0–28.0)
Bicarbonate: 26.4 mmol/L (ref 20.0–28.0)
Calcium, Ion: 1.2 mmol/L (ref 1.15–1.40)
Calcium, Ion: 0.94 mmol/L — ABNORMAL LOW (ref 1.15–1.40)
Calcium, Ion: 1.11 mmol/L — ABNORMAL LOW (ref 1.15–1.40)
Calcium, Ion: 1.07 mmol/L — ABNORMAL LOW (ref 1.15–1.40)
HCT: 37 % — ABNORMAL LOW (ref 39.0–52.0)
HCT: 26 % — ABNORMAL LOW (ref 39.0–52.0)
HCT: 29 % — ABNORMAL LOW (ref 39.0–52.0)
HCT: 31 % — ABNORMAL LOW (ref 39.0–52.0)
Hemoglobin: 12.6 g/dL — ABNORMAL LOW (ref 13.0–17.0)
Hemoglobin: 8.8 g/dL — ABNORMAL LOW (ref 13.0–17.0)
Hemoglobin: 9.9 g/dL — ABNORMAL LOW (ref 13.0–17.0)
Hemoglobin: 10.5 g/dL — ABNORMAL LOW (ref 13.0–17.0)
O2 Saturation: 96 %
O2 Saturation: 100 %
O2 Saturation: 100 %
O2 Saturation: 99 %
Potassium: 3.8 mmol/L (ref 3.5–5.1)
Potassium: 4.1 mmol/L (ref 3.5–5.1)
Potassium: 4 mmol/L (ref 3.5–5.1)
Potassium: 4.1 mmol/L (ref 3.5–5.1)
Sodium: 139 mmol/L (ref 135–145)
Sodium: 140 mmol/L (ref 135–145)
Sodium: 137 mmol/L (ref 135–145)
Sodium: 137 mmol/L (ref 135–145)
TCO2: 27 mmol/L (ref 22–32)
TCO2: 31 mmol/L (ref 22–32)
TCO2: 30 mmol/L (ref 22–32)
TCO2: 28 mmol/L (ref 22–32)
pCO2 arterial: 42.3 mmHg (ref 32.0–48.0)
pCO2 arterial: 48.7 mmHg — ABNORMAL HIGH (ref 32.0–48.0)
pCO2 arterial: 47.1 mmHg (ref 32.0–48.0)
pCO2 arterial: 38.6 mmHg (ref 32.0–48.0)
pH, Arterial: 7.384 (ref 7.350–7.450)
pH, Arterial: 7.395 (ref 7.350–7.450)
pH, Arterial: 7.386 (ref 7.350–7.450)
pH, Arterial: 7.444 (ref 7.350–7.450)
pO2, Arterial: 87 mmHg (ref 83.0–108.0)
pO2, Arterial: 385 mmHg — ABNORMAL HIGH (ref 83.0–108.0)
pO2, Arterial: 351 mmHg — ABNORMAL HIGH (ref 83.0–108.0)
pO2, Arterial: 125 mmHg — ABNORMAL HIGH (ref 83.0–108.0)

## 2021-09-20 LAB — POCT I-STAT EG7
Acid-Base Excess: 1 mmol/L (ref 0.0–2.0)
Bicarbonate: 27 mmol/L (ref 20.0–28.0)
Calcium, Ion: 1.04 mmol/L — ABNORMAL LOW (ref 1.15–1.40)
HCT: 31 % — ABNORMAL LOW (ref 39.0–52.0)
Hemoglobin: 10.5 g/dL — ABNORMAL LOW (ref 13.0–17.0)
O2 Saturation: 80 %
Potassium: 3.5 mmol/L (ref 3.5–5.1)
Sodium: 142 mmol/L (ref 135–145)
TCO2: 28 mmol/L (ref 22–32)
pCO2, Ven: 49.3 mmHg (ref 44.0–60.0)
pH, Ven: 7.347 (ref 7.250–7.430)
pO2, Ven: 47 mmHg — ABNORMAL HIGH (ref 32.0–45.0)

## 2021-09-20 LAB — CBC
HCT: 32.9 % — ABNORMAL LOW (ref 39.0–52.0)
Hemoglobin: 11.3 g/dL — ABNORMAL LOW (ref 13.0–17.0)
MCH: 31.8 pg (ref 26.0–34.0)
MCHC: 34.3 g/dL (ref 30.0–36.0)
MCV: 92.7 fL (ref 80.0–100.0)
Platelets: 110 10*3/uL — ABNORMAL LOW (ref 150–400)
RBC: 3.55 MIL/uL — ABNORMAL LOW (ref 4.22–5.81)
RDW: 12.8 % (ref 11.5–15.5)
WBC: 12.1 10*3/uL — ABNORMAL HIGH (ref 4.0–10.5)
nRBC: 0 % (ref 0.0–0.2)

## 2021-09-20 LAB — PROTIME-INR
INR: 1.4 — ABNORMAL HIGH (ref 0.8–1.2)
Prothrombin Time: 16.8 seconds — ABNORMAL HIGH (ref 11.4–15.2)

## 2021-09-20 LAB — PLATELET COUNT: Platelets: 121 10*3/uL — ABNORMAL LOW (ref 150–400)

## 2021-09-20 LAB — HEMOGLOBIN AND HEMATOCRIT, BLOOD
HCT: 30.5 % — ABNORMAL LOW (ref 39.0–52.0)
Hemoglobin: 10.2 g/dL — ABNORMAL LOW (ref 13.0–17.0)

## 2021-09-20 LAB — GLUCOSE, CAPILLARY
Glucose-Capillary: 177 mg/dL — ABNORMAL HIGH (ref 70–99)
Glucose-Capillary: 166 mg/dL — ABNORMAL HIGH (ref 70–99)
Glucose-Capillary: 120 mg/dL — ABNORMAL HIGH (ref 70–99)
Glucose-Capillary: 107 mg/dL — ABNORMAL HIGH (ref 70–99)
Glucose-Capillary: 128 mg/dL — ABNORMAL HIGH (ref 70–99)
Glucose-Capillary: 125 mg/dL — ABNORMAL HIGH (ref 70–99)

## 2021-09-20 LAB — APTT: aPTT: 35 seconds (ref 24–36)

## 2021-09-20 SURGERY — REPLACEMENT, AORTIC VALVE, OPEN
Anesthesia: General | Site: Chest

## 2021-09-20 MED ORDER — ~~LOC~~ CARDIAC SURGERY, PATIENT & FAMILY EDUCATION
Freq: Once | Status: DC
  Filled 2021-09-20: qty 1

## 2021-09-20 MED ORDER — TRAMADOL HCL 50 MG PO TABS
50.0000 mg | ORAL_TABLET | ORAL | Status: DC | PRN
  Administered 2021-09-21 – 2021-09-22 (×5): 100 mg via ORAL
  Filled 2021-09-20 (×6): qty 2

## 2021-09-20 MED ORDER — PANTOPRAZOLE SODIUM 40 MG PO TBEC: 40.0000 mg | DELAYED_RELEASE_TABLET | Freq: Every day | ORAL | Status: DC

## 2021-09-20 MED ORDER — METOPROLOL TARTRATE 25 MG/10 ML ORAL SUSPENSION: 12.5000 mg | Freq: Two times a day (BID) | ORAL | Status: DC

## 2021-09-20 MED ORDER — ROCURONIUM BROMIDE 10 MG/ML (PF) SYRINGE
PREFILLED_SYRINGE | INTRAVENOUS | Status: DC | PRN
  Administered 2021-09-20 (×3): 50 mg via INTRAVENOUS
  Administered 2021-09-20: 100 mg via INTRAVENOUS

## 2021-09-20 MED ORDER — LORAZEPAM 0.5 MG PO TABS: 0.5000 mg | ORAL_TABLET | Freq: Two times a day (BID) | ORAL | Status: DC | PRN

## 2021-09-20 MED ORDER — HEPARIN SODIUM (PORCINE) 1000 UNIT/ML IJ SOLN
INTRAMUSCULAR | Status: DC | PRN
  Administered 2021-09-20: 37000 [IU] via INTRAVENOUS

## 2021-09-20 MED ORDER — ZOLPIDEM TARTRATE 5 MG PO TABS: 5.0000 mg | ORAL_TABLET | Freq: Every evening | ORAL | Status: DC | PRN

## 2021-09-20 MED ORDER — FENTANYL CITRATE (PF) 250 MCG/5ML IJ SOLN
INTRAMUSCULAR | Status: AC
  Filled 2021-09-20: qty 25

## 2021-09-20 MED ORDER — PHENYLEPHRINE 40 MCG/ML (10ML) SYRINGE FOR IV PUSH (FOR BLOOD PRESSURE SUPPORT)
PREFILLED_SYRINGE | INTRAVENOUS | Status: DC | PRN
Start: 2021-09-20 — End: 2021-09-20
  Administered 2021-09-20: 40 ug via INTRAVENOUS

## 2021-09-20 MED ORDER — FAMOTIDINE IN NACL 20-0.9 MG/50ML-% IV SOLN
20.0000 mg | Freq: Two times a day (BID) | INTRAVENOUS | Status: DC
  Administered 2021-09-20: 20 mg via INTRAVENOUS
  Filled 2021-09-20: qty 50

## 2021-09-20 MED ORDER — METOPROLOL TARTRATE 12.5 MG HALF TABLET
12.5000 mg | ORAL_TABLET | Freq: Once | ORAL | Status: AC
  Administered 2021-09-20: 12.5 mg via ORAL
  Filled 2021-09-20: qty 1

## 2021-09-20 MED ORDER — PROTAMINE SULFATE 10 MG/ML IV SOLN
INTRAVENOUS | Status: AC
  Filled 2021-09-20: qty 10

## 2021-09-20 MED ORDER — LACTATED RINGERS IV SOLN: 500.0000 mL | Freq: Once | INTRAVENOUS | Status: DC | PRN

## 2021-09-20 MED ORDER — ASPIRIN EC 325 MG PO TBEC
325.0000 mg | DELAYED_RELEASE_TABLET | Freq: Every day | ORAL | Status: DC
  Administered 2021-09-21 – 2021-09-24 (×4): 325 mg via ORAL
  Filled 2021-09-20 (×4): qty 1

## 2021-09-20 MED ORDER — BISACODYL 10 MG RE SUPP: 10.0000 mg | Freq: Every day | RECTAL | Status: DC

## 2021-09-20 MED ORDER — ASPIRIN 81 MG PO CHEW: 324.0000 mg | CHEWABLE_TABLET | Freq: Every day | ORAL | Status: DC

## 2021-09-20 MED ORDER — SODIUM CHLORIDE 0.45 % IV SOLN: INTRAVENOUS | Status: DC | PRN

## 2021-09-20 MED ORDER — CHLORHEXIDINE GLUCONATE 0.12 % MT SOLN
15.0000 mL | OROMUCOSAL | Status: AC
  Administered 2021-09-20: 15 mL via OROMUCOSAL

## 2021-09-20 MED ORDER — ATORVASTATIN CALCIUM 40 MG PO TABS
40.0000 mg | ORAL_TABLET | Freq: Every day | ORAL | Status: DC
  Administered 2021-09-21 – 2021-09-23 (×3): 40 mg via ORAL
  Filled 2021-09-20 (×3): qty 1

## 2021-09-20 MED ORDER — MIDAZOLAM HCL (PF) 5 MG/ML IJ SOLN
INTRAMUSCULAR | Status: DC | PRN
  Administered 2021-09-20: 2 mg via INTRAVENOUS
  Administered 2021-09-20: 4 mg via INTRAVENOUS
  Administered 2021-09-20 (×2): 1 mg via INTRAVENOUS
  Administered 2021-09-20: 2 mg via INTRAVENOUS

## 2021-09-20 MED ORDER — FLUTICASONE PROPIONATE 50 MCG/ACT NA SUSP
2.0000 | Freq: Every day | NASAL | Status: DC
  Administered 2021-09-21 – 2021-09-24 (×4): 2 via NASAL
  Filled 2021-09-20: qty 16

## 2021-09-20 MED ORDER — PHENYLEPHRINE 40 MCG/ML (10ML) SYRINGE FOR IV PUSH (FOR BLOOD PRESSURE SUPPORT)
PREFILLED_SYRINGE | INTRAVENOUS | Status: AC
  Filled 2021-09-20: qty 10

## 2021-09-20 MED ORDER — HEPARIN SODIUM (PORCINE) 1000 UNIT/ML IJ SOLN
INTRAMUSCULAR | Status: AC
  Filled 2021-09-20: qty 1

## 2021-09-20 MED ORDER — ACETAMINOPHEN 160 MG/5ML PO SOLN: 1000.0000 mg | Freq: Four times a day (QID) | ORAL | Status: DC

## 2021-09-20 MED ORDER — HYDROCODONE-ACETAMINOPHEN 5-325 MG PO TABS
1.0000 | ORAL_TABLET | ORAL | Status: DC | PRN
  Administered 2021-09-21 (×4): 1 via ORAL
  Filled 2021-09-20 (×4): qty 1

## 2021-09-20 MED ORDER — ROCURONIUM BROMIDE 10 MG/ML (PF) SYRINGE
PREFILLED_SYRINGE | INTRAVENOUS | Status: AC
  Filled 2021-09-20: qty 10

## 2021-09-20 MED ORDER — CEFAZOLIN SODIUM-DEXTROSE 2-4 GM/100ML-% IV SOLN
2.0000 g | Freq: Three times a day (TID) | INTRAVENOUS | Status: AC
  Administered 2021-09-21 – 2021-09-22 (×5): 2 g via INTRAVENOUS
  Filled 2021-09-20 (×6): qty 100

## 2021-09-20 MED ORDER — POVIDONE-IODINE 10 % EX SOLN
CUTANEOUS | Status: DC | PRN
  Administered 2021-09-20: 1 via TOPICAL

## 2021-09-20 MED ORDER — METOCLOPRAMIDE HCL 5 MG/ML IJ SOLN
10.0000 mg | Freq: Four times a day (QID) | INTRAMUSCULAR | Status: DC
  Administered 2021-09-20 – 2021-09-21 (×5): 10 mg via INTRAVENOUS
  Filled 2021-09-20 (×5): qty 2

## 2021-09-20 MED ORDER — LACTATED RINGERS IV SOLN: INTRAVENOUS | Status: DC | PRN

## 2021-09-20 MED ORDER — OMEGA-3-ACID ETHYL ESTERS 1 G PO CAPS
1.0000 g | ORAL_CAPSULE | Freq: Every day | ORAL | Status: DC
  Administered 2021-09-21 – 2021-09-24 (×4): 1 g via ORAL
  Filled 2021-09-20 (×4): qty 1

## 2021-09-20 MED ORDER — LORATADINE 10 MG PO TABS
10.0000 mg | ORAL_TABLET | Freq: Every morning | ORAL | Status: DC
  Administered 2021-09-21 – 2021-09-24 (×4): 10 mg via ORAL
  Filled 2021-09-20 (×5): qty 1

## 2021-09-20 MED ORDER — ORAL CARE MOUTH RINSE
15.0000 mL | OROMUCOSAL | Status: DC
  Administered 2021-09-20 – 2021-09-21 (×3): 15 mL via OROMUCOSAL

## 2021-09-20 MED ORDER — LACTATED RINGERS IV SOLN: INTRAVENOUS | Status: DC

## 2021-09-20 MED ORDER — PROTAMINE SULFATE 10 MG/ML IV SOLN
INTRAVENOUS | Status: DC | PRN
  Administered 2021-09-20: 370 mg via INTRAVENOUS

## 2021-09-20 MED ORDER — POVIDONE-IODINE 7.5 % EX SOLN
CUTANEOUS | Status: DC | PRN
  Administered 2021-09-20: 1 via TOPICAL

## 2021-09-20 MED ORDER — NITROGLYCERIN IN D5W 200-5 MCG/ML-% IV SOLN: 0.0000 ug/min | INTRAVENOUS | Status: DC

## 2021-09-20 MED ORDER — FENTANYL CITRATE (PF) 250 MCG/5ML IJ SOLN
INTRAMUSCULAR | Status: DC | PRN
  Administered 2021-09-20: 250 ug via INTRAVENOUS
  Administered 2021-09-20: 100 ug via INTRAVENOUS
  Administered 2021-09-20: 50 ug via INTRAVENOUS
  Administered 2021-09-20 (×2): 150 ug via INTRAVENOUS
  Administered 2021-09-20: 100 ug via INTRAVENOUS
  Administered 2021-09-20: 150 ug via INTRAVENOUS
  Administered 2021-09-20 (×3): 100 ug via INTRAVENOUS

## 2021-09-20 MED ORDER — 0.9 % SODIUM CHLORIDE (POUR BTL) OPTIME
TOPICAL | Status: DC | PRN
  Administered 2021-09-20: 5000 mL

## 2021-09-20 MED ORDER — TAMSULOSIN HCL 0.4 MG PO CAPS
0.4000 mg | ORAL_CAPSULE | Freq: Every day | ORAL | Status: DC
  Administered 2021-09-21 – 2021-09-23 (×3): 0.4 mg via ORAL
  Filled 2021-09-20 (×3): qty 1

## 2021-09-20 MED ORDER — PROPOFOL 10 MG/ML IV BOLUS
INTRAVENOUS | Status: DC | PRN
  Administered 2021-09-20: 30 mg via INTRAVENOUS
  Administered 2021-09-20: 40 mg via INTRAVENOUS
  Administered 2021-09-20: 20 mg via INTRAVENOUS
  Administered 2021-09-20: 40 mg via INTRAVENOUS
  Administered 2021-09-20: 50 mg via INTRAVENOUS
  Administered 2021-09-20: 20 mg via INTRAVENOUS

## 2021-09-20 MED ORDER — POTASSIUM CHLORIDE 10 MEQ/50ML IV SOLN
10.0000 meq | INTRAVENOUS | Status: AC
  Administered 2021-09-20 (×3): 10 meq via INTRAVENOUS

## 2021-09-20 MED ORDER — CHLORHEXIDINE GLUCONATE 4 % EX LIQD: 30.0000 mL | CUTANEOUS | Status: DC

## 2021-09-20 MED ORDER — SODIUM CHLORIDE 0.9 % IV SOLN: 250.0000 mL | INTRAVENOUS | Status: DC

## 2021-09-20 MED ORDER — SODIUM CHLORIDE 0.9% FLUSH
3.0000 mL | Freq: Two times a day (BID) | INTRAVENOUS | Status: DC
  Administered 2021-09-21 – 2021-09-23 (×5): 3 mL via INTRAVENOUS

## 2021-09-20 MED ORDER — MIDAZOLAM HCL 2 MG/2ML IJ SOLN: 2.0000 mg | INTRAMUSCULAR | Status: DC | PRN

## 2021-09-20 MED ORDER — PROTAMINE SULFATE 10 MG/ML IV SOLN
INTRAVENOUS | Status: AC
  Filled 2021-09-20: qty 25

## 2021-09-20 MED ORDER — DEXTROSE 50 % IV SOLN: 0.0000 mL | INTRAVENOUS | Status: DC | PRN

## 2021-09-20 MED ORDER — SODIUM CHLORIDE 0.9 % IV SOLN: INTRAVENOUS | Status: DC | PRN

## 2021-09-20 MED ORDER — ACETAMINOPHEN 500 MG PO TABS
1000.0000 mg | ORAL_TABLET | Freq: Four times a day (QID) | ORAL | Status: DC
  Administered 2021-09-20 – 2021-09-21 (×3): 1000 mg via ORAL
  Filled 2021-09-20 (×4): qty 2

## 2021-09-20 MED ORDER — SODIUM CHLORIDE 0.9 % IV SOLN: INTRAVENOUS | Status: DC

## 2021-09-20 MED ORDER — PHENYLEPHRINE HCL-NACL 20-0.9 MG/250ML-% IV SOLN: 0.0000 ug/min | INTRAVENOUS | Status: DC

## 2021-09-20 MED ORDER — THROMBIN 20000 UNITS EX SOLR
OROMUCOSAL | Status: DC | PRN
  Administered 2021-09-20 (×3): 4 mL via TOPICAL

## 2021-09-20 MED ORDER — ACETAMINOPHEN 650 MG RE SUPP
650.0000 mg | Freq: Once | RECTAL | Status: AC
  Administered 2021-09-20: 650 mg via RECTAL

## 2021-09-20 MED ORDER — PANTOPRAZOLE SODIUM 40 MG PO TBEC
40.0000 mg | DELAYED_RELEASE_TABLET | Freq: Every day | ORAL | Status: DC
  Administered 2021-09-21 – 2021-09-24 (×4): 40 mg via ORAL
  Filled 2021-09-20 (×4): qty 1

## 2021-09-20 MED ORDER — ACETAMINOPHEN 160 MG/5ML PO SOLN: 650.0000 mg | Freq: Once | ORAL | Status: AC

## 2021-09-20 MED ORDER — BISACODYL 5 MG PO TBEC
10.0000 mg | DELAYED_RELEASE_TABLET | Freq: Every day | ORAL | Status: DC
  Administered 2021-09-21: 10 mg via ORAL
  Filled 2021-09-20: qty 2

## 2021-09-20 MED ORDER — SODIUM CHLORIDE 0.9% FLUSH: 3.0000 mL | INTRAVENOUS | Status: DC | PRN

## 2021-09-20 MED ORDER — CHLORHEXIDINE GLUCONATE 0.12% ORAL RINSE (MEDLINE KIT)
15.0000 mL | Freq: Two times a day (BID) | OROMUCOSAL | Status: DC
  Administered 2021-09-21 – 2021-09-23 (×3): 15 mL via OROMUCOSAL

## 2021-09-20 MED ORDER — VANCOMYCIN HCL IN DEXTROSE 1-5 GM/200ML-% IV SOLN
1000.0000 mg | Freq: Once | INTRAVENOUS | Status: AC
  Administered 2021-09-21: 1000 mg via INTRAVENOUS
  Filled 2021-09-20: qty 200

## 2021-09-20 MED ORDER — CHLORHEXIDINE GLUCONATE CLOTH 2 % EX PADS
6.0000 | MEDICATED_PAD | Freq: Every day | CUTANEOUS | Status: DC
  Administered 2021-09-21 – 2021-09-22 (×2): 6 via TOPICAL

## 2021-09-20 MED ORDER — SERTRALINE HCL 50 MG PO TABS
50.0000 mg | ORAL_TABLET | Freq: Every day | ORAL | Status: DC
  Administered 2021-09-21 – 2021-09-24 (×4): 50 mg via ORAL
  Filled 2021-09-20 (×4): qty 1

## 2021-09-20 MED ORDER — INSULIN REGULAR(HUMAN) IN NACL 100-0.9 UT/100ML-% IV SOLN: INTRAVENOUS | Status: DC

## 2021-09-20 MED ORDER — CHLORHEXIDINE GLUCONATE 0.12 % MT SOLN
15.0000 mL | Freq: Once | OROMUCOSAL | Status: AC
  Administered 2021-09-20: 15 mL via OROMUCOSAL
  Filled 2021-09-20: qty 15

## 2021-09-20 MED ORDER — ALBUMIN HUMAN 5 % IV SOLN: INTRAVENOUS | Status: DC | PRN

## 2021-09-20 MED ORDER — BUPROPION HCL ER (XL) 150 MG PO TB24
150.0000 mg | ORAL_TABLET | Freq: Every morning | ORAL | Status: DC
  Administered 2021-09-21 – 2021-09-24 (×4): 150 mg via ORAL
  Filled 2021-09-20 (×5): qty 1

## 2021-09-20 MED ORDER — ALBUTEROL SULFATE (2.5 MG/3ML) 0.083% IN NEBU: 3.0000 mL | INHALATION_SOLUTION | Freq: Four times a day (QID) | RESPIRATORY_TRACT | Status: DC | PRN

## 2021-09-20 MED ORDER — PROPOFOL 10 MG/ML IV BOLUS
INTRAVENOUS | Status: AC
  Filled 2021-09-20: qty 20

## 2021-09-20 MED ORDER — MIDAZOLAM HCL (PF) 10 MG/2ML IJ SOLN
INTRAMUSCULAR | Status: AC
  Filled 2021-09-20: qty 2

## 2021-09-20 MED ORDER — STERILE WATER FOR IRRIGATION IR SOLN
Status: DC | PRN
  Administered 2021-09-20: 2000 mL

## 2021-09-20 MED ORDER — HEMOSTATIC AGENTS (NO CHARGE) OPTIME
TOPICAL | Status: DC | PRN
  Administered 2021-09-20: 1 via TOPICAL

## 2021-09-20 MED ORDER — METOPROLOL TARTRATE 5 MG/5ML IV SOLN
2.5000 mg | INTRAVENOUS | Status: DC | PRN
  Administered 2021-09-21 (×2): 2.5 mg via INTRAVENOUS
  Filled 2021-09-20: qty 5

## 2021-09-20 MED ORDER — DOCUSATE SODIUM 100 MG PO CAPS
200.0000 mg | ORAL_CAPSULE | Freq: Every day | ORAL | Status: DC
  Administered 2021-09-21: 200 mg via ORAL
  Filled 2021-09-20: qty 2

## 2021-09-20 MED ORDER — MAGNESIUM SULFATE 4 GM/100ML IV SOLN
4.0000 g | Freq: Once | INTRAVENOUS | Status: AC
  Administered 2021-09-20: 4 g via INTRAVENOUS
  Filled 2021-09-20: qty 100

## 2021-09-20 MED ORDER — THROMBIN (RECOMBINANT) 20000 UNITS EX SOLR
CUTANEOUS | Status: AC
  Filled 2021-09-20: qty 20000

## 2021-09-20 MED ORDER — ONDANSETRON HCL 4 MG/2ML IJ SOLN: 4.0000 mg | Freq: Four times a day (QID) | INTRAMUSCULAR | Status: DC | PRN

## 2021-09-20 MED ORDER — ALBUMIN HUMAN 5 % IV SOLN
250.0000 mL | INTRAVENOUS | Status: AC | PRN
  Administered 2021-09-20: 12.5 g via INTRAVENOUS

## 2021-09-20 MED ORDER — DEXMEDETOMIDINE HCL IN NACL 400 MCG/100ML IV SOLN
0.0000 ug/kg/h | INTRAVENOUS | Status: DC
  Administered 2021-09-20: 0.5 ug/kg/h via INTRAVENOUS
  Filled 2021-09-20: qty 100

## 2021-09-20 MED ORDER — MORPHINE SULFATE (PF) 2 MG/ML IV SOLN
1.0000 mg | INTRAVENOUS | Status: DC | PRN
  Administered 2021-09-20 – 2021-09-21 (×6): 2 mg via INTRAVENOUS
  Administered 2021-09-21: 4 mg via INTRAVENOUS
  Filled 2021-09-20: qty 2
  Filled 2021-09-20: qty 1
  Filled 2021-09-20: qty 2
  Filled 2021-09-20 (×2): qty 1
  Filled 2021-09-20: qty 2

## 2021-09-20 MED ORDER — SODIUM CHLORIDE (PF) 0.9 % IJ SOLN
INTRAMUSCULAR | Status: AC
  Filled 2021-09-20: qty 10

## 2021-09-20 MED ORDER — METOPROLOL TARTRATE 12.5 MG HALF TABLET
12.5000 mg | ORAL_TABLET | Freq: Two times a day (BID) | ORAL | Status: DC
  Administered 2021-09-21 (×2): 12.5 mg via ORAL
  Filled 2021-09-20 (×2): qty 1

## 2021-09-20 MED ORDER — CHLORHEXIDINE GLUCONATE 0.12 % MT SOLN
OROMUCOSAL | Status: AC
  Administered 2021-09-20: 15 mL via OROMUCOSAL
  Filled 2021-09-20: qty 15

## 2021-09-20 SURGICAL SUPPLY — 93 items
ADAPTER CARDIO PERF ANTE/RETRO (ADAPTER) ×4 IMPLANT
ADPR PRFSN 84XANTGRD RTRGD (ADAPTER) ×2
ARTICLIP LAA PROCLIP II 35 (Clip) ×4 IMPLANT
BAG DECANTER FOR FLEXI CONT (MISCELLANEOUS) IMPLANT
BLADE CLIPPER SURG (BLADE) IMPLANT
BLADE STERNUM SYSTEM 6 (BLADE) ×4 IMPLANT
BLADE SURG 15 STRL LF DISP TIS (BLADE) ×3 IMPLANT
BLADE SURG 15 STRL SS (BLADE) ×4
CANISTER SUCT 3000ML PPV (MISCELLANEOUS) ×4 IMPLANT
CANNULA ARTERIAL NVNT 3/8 22FR (MISCELLANEOUS) ×4 IMPLANT
CANNULA GUNDRY RCSP 15FR (MISCELLANEOUS) ×4 IMPLANT
CATH HEART VENT LEFT (CATHETERS) ×6 IMPLANT
CATH ROBINSON RED A/P 18FR (CATHETERS) ×12 IMPLANT
CATH THORACIC 36FR (CATHETERS) ×4 IMPLANT
CATH THORACIC 36FR RT ANG (CATHETERS) ×4 IMPLANT
CNTNR URN SCR LID CUP LEK RST (MISCELLANEOUS) ×3 IMPLANT
CONT SPEC 4OZ STRL OR WHT (MISCELLANEOUS) ×4
CONTAINER PROTECT SURGISLUSH (MISCELLANEOUS) IMPLANT
COVER SURGICAL LIGHT HANDLE (MISCELLANEOUS) ×4 IMPLANT
DEVICE ATRICLIP LAA PRCLPII 35 (Clip) ×3 IMPLANT
DEVICE SUT CK QUICK LOAD MINI (Prosthesis & Implant Heart) ×4 IMPLANT
DRAPE CARDIOVASCULAR INCISE (DRAPES) ×4
DRAPE SLUSH MACHINE 52X66 (DRAPES) ×4 IMPLANT
DRAPE SRG 135X102X78XABS (DRAPES) ×3 IMPLANT
DRAPE WARM FLUID 44X44 (DRAPES) IMPLANT
DRSG COVADERM 4X14 (GAUZE/BANDAGES/DRESSINGS) ×4 IMPLANT
ELECT CAUTERY BLADE 6.4 (BLADE) ×4 IMPLANT
ELECT REM PT RETURN 9FT ADLT (ELECTROSURGICAL) ×8
ELECTRODE REM PT RTRN 9FT ADLT (ELECTROSURGICAL) ×6 IMPLANT
FELT TEFLON 1X6 (MISCELLANEOUS) ×8 IMPLANT
GAUZE 4X4 16PLY ~~LOC~~+RFID DBL (SPONGE) ×4 IMPLANT
GAUZE SPONGE 4X4 12PLY STRL (GAUZE/BANDAGES/DRESSINGS) ×4 IMPLANT
GAUZE SPONGE 4X4 12PLY STRL LF (GAUZE/BANDAGES/DRESSINGS) ×4 IMPLANT
GLOVE SURG ENC MOIS LTX SZ6 (GLOVE) IMPLANT
GLOVE SURG ENC MOIS LTX SZ6.5 (GLOVE) IMPLANT
GLOVE SURG ENC MOIS LTX SZ7 (GLOVE) IMPLANT
GLOVE SURG ENC MOIS LTX SZ7.5 (GLOVE) IMPLANT
GLOVE SURG MICRO LTX SZ6 (GLOVE) ×4 IMPLANT
GLOVE SURG MICRO LTX SZ7 (GLOVE) ×8 IMPLANT
GLOVE SURG POLYISO LF SZ6 (GLOVE) ×4 IMPLANT
GLOVE SURG POLYISO LF SZ6.5 (GLOVE) ×4 IMPLANT
GLOVE SURG POLYISO LF SZ8 (GLOVE) ×4 IMPLANT
GLOVE SURG UNDER LTX SZ8 (GLOVE) ×12 IMPLANT
GLOVE SURG UNDER POLY LF SZ6.5 (GLOVE) ×8 IMPLANT
GOWN STRL REUS W/ TWL LRG LVL3 (GOWN DISPOSABLE) ×15 IMPLANT
GOWN STRL REUS W/ TWL XL LVL3 (GOWN DISPOSABLE) ×12 IMPLANT
GOWN STRL REUS W/TWL LRG LVL3 (GOWN DISPOSABLE) ×20
GOWN STRL REUS W/TWL XL LVL3 (GOWN DISPOSABLE) ×16
HEMOSTAT POWDER SURGIFOAM 1G (HEMOSTASIS) ×12 IMPLANT
HEMOSTAT SURGICEL 2X14 (HEMOSTASIS) ×4 IMPLANT
INSERT FOGARTY XLG (MISCELLANEOUS) ×4 IMPLANT
KIT BASIN OR (CUSTOM PROCEDURE TRAY) ×4 IMPLANT
KIT CATH CPB BARTLE (MISCELLANEOUS) ×4 IMPLANT
KIT SUCTION CATH 14FR (SUCTIONS) ×4 IMPLANT
KIT SUT CK MINI COMBO 4X17 (Prosthesis & Implant Heart) ×4 IMPLANT
KIT TURNOVER KIT B (KITS) ×4 IMPLANT
LINE VENT (MISCELLANEOUS) ×4 IMPLANT
NS IRRIG 1000ML POUR BTL (IV SOLUTION) ×20 IMPLANT
PACK E OPEN HEART (SUTURE) ×4 IMPLANT
PACK OPEN HEART (CUSTOM PROCEDURE TRAY) ×4 IMPLANT
PAD ARMBOARD 7.5X6 YLW CONV (MISCELLANEOUS) ×8 IMPLANT
POSITIONER HEAD DONUT 9IN (MISCELLANEOUS) ×4 IMPLANT
SENSOR MYOCARDIAL TEMP (MISCELLANEOUS) ×4 IMPLANT
SET MPS 3-ND DEL (MISCELLANEOUS) ×4 IMPLANT
SOL PREP POV-IOD 4OZ 10% (MISCELLANEOUS) ×8 IMPLANT
SOL PREP PROV IODINE SCRUB 4OZ (MISCELLANEOUS) ×8 IMPLANT
SPONGE T-LAP 18X18 ~~LOC~~+RFID (SPONGE) ×16 IMPLANT
SPONGE T-LAP 4X18 ~~LOC~~+RFID (SPONGE) ×4 IMPLANT
SUT BONE WAX W31G (SUTURE) ×4 IMPLANT
SUT EB EXC GRN/WHT 2-0 V-5 (SUTURE) ×8 IMPLANT
SUT ETHIBON EXCEL 2-0 V-5 (SUTURE) ×8 IMPLANT
SUT ETHIBOND 2 0 SH (SUTURE) ×4
SUT ETHIBOND 2 0 SH 36X2 (SUTURE) ×3 IMPLANT
SUT ETHIBOND V-5 VALVE (SUTURE) ×8 IMPLANT
SUT PROLENE 3 0 SH DA (SUTURE) IMPLANT
SUT PROLENE 3 0 SH1 36 (SUTURE) ×4 IMPLANT
SUT PROLENE 4 0 RB 1 (SUTURE) ×12
SUT PROLENE 4-0 RB1 .5 CRCL 36 (SUTURE) ×9 IMPLANT
SUT SILK 2 0 SH CR/8 (SUTURE) ×4 IMPLANT
SUT STEEL 6MS V (SUTURE) IMPLANT
SUT STEEL STERNAL CCS#1 18IN (SUTURE) ×4 IMPLANT
SUT STEEL SZ 6 DBL 3X14 BALL (SUTURE) ×8 IMPLANT
SUT VIC AB 1 CTX 36 (SUTURE) ×12
SUT VIC AB 1 CTX36XBRD ANBCTR (SUTURE) ×9 IMPLANT
SYSTEM SAHARA CHEST DRAIN ATS (WOUND CARE) ×4 IMPLANT
TAPE CLOTH SURG 4X10 WHT LF (GAUZE/BANDAGES/DRESSINGS) ×4 IMPLANT
TOWEL GREEN STERILE (TOWEL DISPOSABLE) ×4 IMPLANT
TOWEL GREEN STERILE FF (TOWEL DISPOSABLE) ×4 IMPLANT
TRAY FOLEY SLVR 16FR TEMP STAT (SET/KITS/TRAYS/PACK) ×4 IMPLANT
UNDERPAD 30X36 HEAVY ABSORB (UNDERPADS AND DIAPERS) ×4 IMPLANT
VALVE AORTIC SZ25 INSP/RESIL (Prosthesis & Implant Heart) ×4 IMPLANT
VENT LEFT HEART 12002 (CATHETERS) ×8
WATER STERILE IRR 1000ML POUR (IV SOLUTION) ×8 IMPLANT

## 2021-09-20 NOTE — Anesthesia Procedure Notes (Signed)
Arterial Line Insertion Start/End11/05/2021 10:30 AM, 09/20/2021 10:40 AM Performed by: Ardyth Harps, CRNA, CRNA  Patient location: Pre-op. Preanesthetic checklist: patient identified, IV checked, site marked, risks and benefits discussed, surgical consent, monitors and equipment checked, pre-op evaluation, timeout performed and anesthesia consent Lidocaine 1% used for infiltration Right, radial was placed Catheter size: 20 G Hand hygiene performed , maximum sterile barriers used  and Seldinger technique used Allen's test indicative of satisfactory collateral circulation Attempts: 1 Procedure performed without using ultrasound guided technique. Following insertion, dressing applied and Biopatch. Post procedure assessment: normal and unchanged

## 2021-09-20 NOTE — Anesthesia Procedure Notes (Signed)
Central Venous Catheter Insertion Performed by: Oleta Mouse, MD, anesthesiologist Start/End11/05/2021 10:33 AM, 09/20/2021 10:45 AM Patient location: Pre-op. Preanesthetic checklist: patient identified, IV checked, site marked, risks and benefits discussed, surgical consent, monitors and equipment checked, pre-op evaluation, timeout performed and anesthesia consent Hand hygiene performed  and maximum sterile barriers used  PA cath was placed.Swan type:thermodilution Procedure performed without using ultrasound guided technique. Attempts: 1 Patient tolerated the procedure well with no immediate complications.

## 2021-09-20 NOTE — Anesthesia Procedure Notes (Signed)
Procedure Name: Intubation Date/Time: 09/20/2021 12:57 PM Performed by: Reece Agar, CRNA Pre-anesthesia Checklist: Patient identified, Emergency Drugs available, Suction available and Patient being monitored Patient Re-evaluated:Patient Re-evaluated prior to induction Oxygen Delivery Method: Circle System Utilized Preoxygenation: Pre-oxygenation with 100% oxygen Induction Type: IV induction Ventilation: Mask ventilation without difficulty and Oral airway inserted - appropriate to patient size Laryngoscope Size: Mac and 4 Grade View: Grade II Tube type: Oral Tube size: 8.0 mm Number of attempts: 1 Airway Equipment and Method: Stylet and Oral airway Placement Confirmation: ETT inserted through vocal cords under direct vision, positive ETCO2 and breath sounds checked- equal and bilateral Secured at: 23 cm Tube secured with: Tape Dental Injury: Teeth and Oropharynx as per pre-operative assessment

## 2021-09-20 NOTE — Brief Op Note (Signed)
09/20/2021  9:18 AM  PATIENT:  [name]  70 y.o. male  PRE-OPERATIVE DIAGNOSIS:  AS  POST-OPERATIVE DIAGNOSIS:  AS  PROCEDURE:  Procedure(s):  AORTIC VALVE REPLACEMENT   -25 mm [employer] Resilia Aortic Valve  CLIPPING OF ATRIAL APPENDAGE  -ATRICURE 35 MM ATRICLIP   TRANSESOPHAGEAL ECHOCARDIOGRAM (TEE) (N/A)  APPLICATION OF CELL SAVER (N/A)  SURGEON:  Surgeon(s) and Role:    * Bartle, Fernande Boyden, MD - Primary  PHYSICIAN ASSISTANT: Ellwood Handler PA-C   ASSISTANTS: Ardyth Man RNFA   ANESTHESIA:   general  EBL:  900 mL   BLOOD ADMINISTERED:   CC CELLSAVER  DRAINS:  Mediastinal Chest Drains    LOCAL MEDICATIONS USED:  NONE  SPECIMEN:  Source of Specimen:  Aortic Valve Leaflets  DISPOSITION OF SPECIMEN:  PATHOLOGY  COUNTS:  YES  TOURNIQUET:  * No tourniquets in log *  DICTATION: .Dragon Dictation  PLAN OF CARE: Admit to inpatient   PATIENT DISPOSITION:  ICU - extubated and stable.   Delay start of Pharmacological VTE agent (>24hrs) due to surgical blood loss or risk of bleeding: yes

## 2021-09-20 NOTE — Hospital Course (Addendum)
History of Present Illness:  The patient is a 70 year old gentleman with a history of hypertension, hyperlipidemia, diabetes with peripheral neuropathy, paroxysmal atrial fibrillation status post ablation on Eliquis, OSA intolerant of CPAP, prostate cancer status post radioactive seed implant in April 2022, and aortic stenosis.  He has been followed by Dr. Tamala Julian and an echo in March 2022 showed an increase in the mean gradient to 31.6 mmHg with a peak gradient of 56 mmHg.  Valve area was 1.2 cm.  His most recent echo on 07/20/2021 showed further progression to severe aortic stenosis with a mean gradient of 44 mmHg and a peak gradient of 82 mmHg.  Dimensionless index is 0.2 with a valve area of 0.98 cm.  Left ventricular ejection fraction is 60 to 65% with moderate asymmetric LVH of the basal-septal segment with grade 1 diastolic dysfunction.  There is mild mitral valve regurgitation.  He subsequently underwent cardiac catheterization on 08/04/2021 showing mild nonobstructive coronary disease with normal right heart pressures.  He was evaluated by Dr. Cyndia Bent at which time the patient noted  progressive exertional fatigue and shortness of breath over the past year.  He used to walk every day but tires easily now.  He has some episodes of lightheadedness with bending over.  He denies any chest pressure or pain.  He has had mild peripheral edema.  The patient was felt to require aortic valve replacement.  The risks and benefits of the procedure were explained to the patient and he was agreeable to proceed.  Hospital Course:  [name] presented to [facility] on 09/20/2021.  He underwent Aortic Valve Replacement with a 25 mm Edwards Resilia Bioprosthetic valve and clipping of his LA appendage with a 35 mm Atricure Pro2 Clip.  He tolerated the procedure without difficulty and was taken to the SICU in stable condition. He was extubated late the evening of surgery without difficulty. He was initially A paced  at 80. [name], a line, and chest tubes were removed on post op day one. Foley was then removed on post op day 2. He was started on Lopressor. He was volume overloaded and diuresed accordingly. He was weaned off the Insulin drip. His pre op HGA1C 6.8. He will be restarted on Metformin closer to or at discharge. He was felt surgically stable for transfer from the ICU to 4E for further convalescence on 11/09. He was weaned off oxygen and ambulating with good oxygenation on room air. He did have post op pain. He has an allergy to Oxy but tolerated Norco. He has been tolerating a diet and has had a bowel movement. His wound is clean, dry, and healing without sign of infection. Epicardial pacing wires were removed on 11/10.  He was hypertensive and started on low dose Cozaar.  He continues to maintain NSR.  He will be started on home Eliquis at time of discharge.  He is ambulating without difficulty.  His surgical incisions are healing without evidence of infection.  He is medically stable for discharge home today.

## 2021-09-20 NOTE — Progress Notes (Signed)
Extubation Procedure Note  Patient Details:   Name: Jeremy Tucker DOB: 11-Mar-1951 MRN: [id number]   Airway Documentation:    Vent end date: 09/20/21 Vent end time: 2215   Evaluation  O2 sats: stable throughout Complications: No apparent complications Patient did tolerate procedure well. Bilateral Breath Sounds: Clear, Diminished   Yes  NIF -40cm H2O VC 123ml  Deslyn Cavenaugh Arrie Senate 09/20/2021, 10:23 PM

## 2021-09-20 NOTE — Progress Notes (Signed)
Patient ID: Jeremy Tucker, male   DOB: 02/20/1951, 70 y.o.   MRN: [id number]  TCTS Evening Rounds:   Hemodynamically stable  CI = 1.6  giving some volume.  Remains asleep on vent  Urine output good  CT output low  CBC    Component Value Date/Time   WBC 12.1 (H) 09/20/2021 1800   RBC 3.55 (L) 09/20/2021 1800   HGB 11.3 (L) 09/20/2021 1800   HGB 14.0 07/27/2021 0839   HCT 32.9 (L) 09/20/2021 1800   HCT 41.1 07/27/2021 0839   PLT 110 (L) 09/20/2021 1800   PLT 211 07/27/2021 0839   MCV 92.7 09/20/2021 1800   MCV 91 07/27/2021 0839   MCH 31.8 09/20/2021 1800   MCHC 34.3 09/20/2021 1800   RDW 12.8 09/20/2021 1800   RDW 12.1 07/27/2021 0839   LYMPHSABS 1.7 01/28/2020 1023   MONOABS 781 08/16/2016 0925   EOSABS 0.2 01/28/2020 1023   BASOSABS 0.1 01/28/2020 1023     BMET    Component Value Date/Time   NA 138 09/20/2021 1640   NA 142 07/27/2021 0839   K 4.1 09/20/2021 1640   CL 100 09/20/2021 1640   CO2 24 09/16/2021 1439   GLUCOSE 123 (H) 09/20/2021 1640   BUN 10 09/20/2021 1640   BUN 14 07/27/2021 0839   CREATININE 0.60 (L) 09/20/2021 1640   CREATININE 0.75 08/16/2016 0925   CALCIUM 9.2 09/16/2021 1439   EGFR 94 07/27/2021 0839   GFRNONAA >60 09/16/2021 1439     A/P:  Stable postop course. Continue current plans

## 2021-09-20 NOTE — H&P (Signed)
[address]       [place] [address]             [phone]      Cardiothoracic Surgery Admission History and Physical   PCP is Denita Lung, MD Referring Provider is Sherren Mocha, MD Primary Cardiologist is [name], MD   Reason for admission:  Severe aortic stenosis   HPI:   The patient is a 70 year old gentleman with a history of hypertension, hyperlipidemia, diabetes with peripheral neuropathy, paroxysmal atrial fibrillation status post ablation on Eliquis, OSA intolerant of CPAP, prostate cancer status post radioactive seed implant in April 2022, and aortic stenosis.  He has been followed by Dr. Tamala Julian and an echo in March 2022 showed an increase in the mean gradient to 31.6 mmHg with a peak gradient of 56 mmHg.  Valve area was 1.2 cm.  His most recent echo on 07/20/2021 showed further progression to severe aortic stenosis with a mean gradient of 44 mmHg and a peak gradient of 82 mmHg.  Dimensionless index is 0.2 with a valve area of 0.98 cm.  Left ventricular ejection fraction is 60 to 65% with moderate asymmetric LVH of the basal-septal segment with grade 1 diastolic dysfunction.  There is mild mitral valve regurgitation.  He subsequently underwent cardiac catheterization on 08/04/2021 showing mild nonobstructive coronary disease with normal right heart pressures.   The patient is married and lives with his wife.  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  He retired about 6 years ago.  He has noted progressive exertional fatigue and shortness of breath over the past year.  He used to walk every day but tires easily now.  He has some episodes of lightheadedness with bending over.  He denies any chest pressure or pain.  He has had mild peripheral edema.  He sees a Pharmacist, community every 6 months and has had no issues.  Last visit was in 10/22.       Past Medical History:  Diagnosis Date   Anxiety     BPH  (benign prostatic hyperplasia)     BPH without obstruction/lower urinary tract symptoms     Depression     Diabetic peripheral neuropathy ([place]) 01/07/2019   ED (erectile dysfunction)     Essential hypertension     GERD (gastroesophageal reflux disease)     Hiatal hernia     History of chronic bronchitis      per pt used inhaler as needed   History of DVT of lower extremity 2001    s/p  left TKA completed blood thinner treatment,  per pt no previous clot and none since   History of gastric polyp      per pt benign   History of kidney stones     Hyperlipidemia     Mild CAD cardiologist--- dr h. Tamala Julian    nuclear study 04-14-2017 normal , low risk, nuclear ef 53%;  cardiac cath 09-19-2018  midRCA 30% stenosis otherwise normal coronaries,  heavy AV calicification with decreased mobility with peak grandiant 82mmHg   Mild dilation of ascending aorta ([place])      per echo 01-13-2021  52mm   Nephrolithiasis     OA (osteoarthritis)     OSA (obstructive sleep apnea)      per pt refused cpap, intolerant;  moderate osa per study in epic 03-08-2015   PAF (paroxysmal atrial fibrillation) ([place])  followed by dr h. 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/2018   Prostate cancer [facility]) urologist--- dr dahlstedt/  oncology-- dr Tammi Klippel    first dx 03/ 2021,  Glesaon 3+4  PSA 6.95   Pulmonary nodule 2019    incidently finding on CTA 09-25-2018, stable   Right carpal tunnel syndrome 01/07/2019   Severe aortic stenosis     Type 2 diabetes mellitus ([place])      followed by pcp   (03-02-2021  pt stated does not check blood sugar at home)   Umbilical hernia             Past Surgical History:  Procedure Laterality Date   ATRIAL FIBRILLATION ABLATION N/A 03/12/2015    Procedure: ATRIAL FIBRILLATION ABLATION;  Surgeon: [name], MD;  Location: [facility] CATH LAB;  Service: Cardiovascular;  Laterality: N/A;   CARDIAC CATHETERIZATION   1990's     "Dr. Melvern Banker", no blockages per pt   CARDIOVERSION N/A 02/25/2015    Procedure: CARDIOVERSION;  Surgeon: Sueanne Margarita, MD;  Location: [facility] ENDOSCOPY;  Service: Cardiovascular;  Laterality: N/A;   CATARACT EXTRACTION W/ INTRAOCULAR LENS  IMPLANT, BILATERAL   2016   COLONOSCOPY   last one 2013  dr Earlean Shawl   CYSTOSCOPY N/A 03/08/2021    Procedure: CYSTOSCOPY BLADDER CALCULI EXTRACTION ;  Surgeon: Franchot Gallo, MD;  [facility];  Service: Urology;  Laterality: N/A;   CYSTOSCOPY WITH RETROGRADE PYELOGRAM, URETEROSCOPY AND STENT PLACEMENT Left 04/24/2015    Procedure: URETHRAL MEATAL DILATION, CYSTOSCOPY WITH LEFT RETROGRADE PYELOGRAM, LEFT URETEROSCOPY, STONE BASKET EXTRACTION,  LEFT DOUBLE J STENT PLACEMENT;  Surgeon: Carolan Clines, MD;  Location: [facility] ORS;  Service: Urology;  Laterality: Left;   EP IMPLANTABLE DEVICE N/A 09/15/2015    Procedure: Loop Recorder Insertion;  Surgeon: [name], MD;  Location: [place] CV LAB;  Service: Cardiovascular;  Laterality: N/A;   ESOPHAGOGASTRODUODENOSCOPY   last one 2017   EXTRACORPOREAL SHOCK WAVE LITHOTRIPSY Bilateral 04/02/2020    Procedure: EXTRACORPOREAL SHOCK WAVE LITHOTRIPSY (ESWL);  Surgeon: Cleon Gustin, MD;  Location: [facility];  Service: Urology;  Laterality: Bilateral;   EXTRACORPOREAL SHOCK WAVE LITHOTRIPSY Left 04/06/2020    Procedure: EXTRACORPOREAL SHOCK WAVE LITHOTRIPSY (ESWL);  Surgeon: [name], MD;  Location[facility];  Service: Urology;  Laterality: Left;   EYE SURGERY       HOLMIUM LASER APPLICATION Left 02/54/2706    Procedure: HOLMIUM LASER APPLICATION;  Surgeon: Carolan Clines, MD;  Location: [facility];  Service: Urology;  Laterality: Left;   implantable loopr recorder removal   02/22/2021    MDT LINQ removed in office by Dr Allred   JOINT REPLACEMENT       KNEE ARTHROSCOPY Bilateral "multiple times"   LEFT HEART CATH AND CORONARY ANGIOGRAPHY  N/A 09/19/2018    Procedure: LEFT HEART CATH AND CORONARY ANGIOGRAPHY;  Surgeon: Belva Crome, MD;  Location: [place] CV LAB;  Service: Cardiovascular;  Laterality: N/A;   PERCUTANEOUS NEPHROSTOLITHOTOMY   1980s and 1990s   RADIOACTIVE SEED IMPLANT N/A 03/08/2021    Procedure: RADIOACTIVE SEED IMPLANT/BRACHYTHERAPY IMPLANT;  Surgeon: Franchot Gallo, MD;  Location: [facility];  Service: Urology;  Laterality: N/A;  90 MINS   RIGHT/LEFT HEART CATH AND CORONARY ANGIOGRAPHY N/A 08/04/2021    Procedure: RIGHT/LEFT HEART CATH AND CORONARY ANGIOGRAPHY;  Surgeon: Belva Crome, MD;  Location: [facility]  INVASIVE CV LAB;  Service: Cardiovascular;  Laterality: N/A;   SKIN BIOPSY Left 05/06/2020    Shave biopsy left posterior hand neurofiberoma.   SPACE OAR INSTILLATION N/A 03/08/2021    Procedure: SPACE OAR INSTILLATION;  Surgeon: Franchot Gallo, MD;  Location: [facility];  Service: Urology;  Laterality: N/A;   TEE WITHOUT CARDIOVERSION N/A 03/12/2015    Procedure: TRANSESOPHAGEAL ECHOCARDIOGRAM (TEE);  Surgeon: Pixie Casino, MD;  Location: [facility];  Service: Cardiovascular;  Laterality: N/A;   TONSILLECTOMY   age 10   TOTAL KNEE ARTHROPLASTY Left 11-22-1999  @[facility]    TOTAL KNEE ARTHROPLASTY Right 10/30/2017    Procedure: RIGHT TOTAL KNEE ARTHROPLASTY;  Surgeon: Paralee Cancel, MD;  Location: [facility];  Service: Orthopedics;  Laterality: Right;  90 mins   UMBILICAL HERNIA REPAIR N/A 07/05/2021    Procedure: REPAIR INCARCERATED UMBILICAL HERNIA WITH MESH;  Surgeon: Georganna Skeans, MD;  Location: [facility] OR;  Service: General;  Laterality: N/A;           Family History  Problem Relation Age of Onset   Heart attack Mother 53        MI   Heart attack Father 14        ? MI versus trauma to head   Healthy Brother     Cancer Maternal Grandfather          had prostate removed   Breast cancer Neg Hx     Colon cancer Neg Hx     Pancreatic cancer Neg Hx     Prostate  cancer Neg Hx        Social History         Socioeconomic History   Marital status: Married      Spouse name: Not on file   Number of children: 1   Years of education: Not on file   Highest education level: Some college, no degree  Occupational History   Occupation: Retired  Tobacco Use   Smoking status: Never   Smokeless tobacco: Never  Vaping Use   Vaping Use: Never used  Substance and Sexual Activity   Alcohol use: Yes      Comment: very rare   Drug use: Never   Sexual activity: Yes  Other Topics Concern   Not on file  Social History Narrative    Pt lives in [place] with spouse.  16 yo son is health.  Second son died in car accident.         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 Authorizing Provider  acetaminophen (TYLENOL) 500 MG tablet Take 1,000 mg by mouth every 8 (eight) hours as needed for mild pain.      Yes [provider]  albuterol (VENTOLIN HFA) 108 (90 Base) MCG/ACT inhaler INHALE 2 PUFFS INTO THE LUNGS EVERY 6 HOURS AS NEEDED FOR WHEEZING OR SHORTNESS OF BREATH 09/25/19   Yes Denita Lung, MD  allopurinol (ZYLOPRIM) 300 MG tablet Take 1 tablet (300 mg total)  by mouth daily. 08/03/21   Yes Denita Lung, MD  ammonium lactate (LAC-HYDRIN) 12 % lotion Apply 1 application topically daily as needed for dry skin.     Yes [provider]  atorvastatin (LIPITOR) 40 MG tablet Take 1 tablet (40 mg total) by mouth at bedtime. 08/03/21   Yes Denita Lung, MD  buPROPion (WELLBUTRIN XL) 300 MG 24 hr tablet Take 1 tablet (300 mg total) by mouth  daily. 08/03/21   Yes Denita Lung, MD  diltiazem [facility]) 180 MG 24 hr capsule Take 1 capsule (180 mg total) by mouth daily. 03/01/21   Yes Denita Lung, MD  ELIQUIS 5 MG TABS tablet TAKE ONE TABLET BY MOUTH TWICE A DAY 07/12/21   Yes Allred, Jeneen Rinks, MD  finasteride (PROSCAR) 5 MG tablet Take 5 mg by mouth daily.     Yes [provider]  fluticasone (FLONASE) 50 MCG/ACT nasal spray Place 2 sprays into both nostrils daily. 12/08/20   Yes [provider]  HYDROcodone-acetaminophen (NORCO/VICODIN) 5-325 MG tablet Take 1 tablet by mouth every 6 (six) hours as needed for moderate pain. 07/05/21   Yes Georganna Skeans, MD  loratadine (CLARITIN) 10 MG tablet Take 10 mg by mouth daily.     Yes [provider]  LORazepam (ATIVAN) 0.5 MG tablet TAKE 1 TABLET TWICE DAILY AS NEEDED FOR ANXIETY. 03/22/16   Yes Denita Lung, MD  metFORMIN (GLUCOPHAGE) 500 MG tablet Take 1 tablet (500 mg total) by mouth 2 (two) times daily with a meal. 08/03/21   Yes Denita Lung, MD  Multiple Vitamins-Minerals (MULTIVITAMIN WITH MINERALS) tablet Take 1 tablet by mouth daily.     Yes [provider]  olopatadine (PATANOL) 0.1 % ophthalmic solution Place 1 drop into both eyes daily as needed for allergies (tearing).      Yes [provider]  Omega-3 Fatty Acids (FISH OIL) 1000 MG CAPS Take 1,000 mg by mouth daily.     Yes [provider]  omeprazole (PRILOSEC) 20 MG capsule Take 20 mg by mouth daily.     Yes [provider]  sertraline (ZOLOFT) 50 MG tablet Take 1 tablet (50 mg total) by mouth daily. 08/03/21   Yes Denita Lung, MD  sildenafil (VIAGRA) 100 MG tablet Take 1 tablet (100 mg total) by mouth daily as needed for erectile dysfunction. 08/03/21   Yes Denita Lung, MD  tamsulosin (FLOMAX) 0.4 MG CAPS capsule Take 0.8 mg by mouth in the morning and at bedtime.     Yes [provider]  zolpidem (AMBIEN) 5 MG tablet Take 1 tablet (5 mg total) by  mouth at bedtime as needed for sleep. 08/03/21   Yes Denita Lung, MD  metoprolol tartrate (LOPRESSOR) 50 MG tablet Take 1 tablet (50 mg total) by mouth once for 1 dose. Take 1-2 hours prior to your CT scans on 08/25/2021. 08/17/21 08/17/21   Sherren Mocha, MD            Current Outpatient Medications  Medication Sig Dispense Refill   acetaminophen (TYLENOL) 500 MG tablet Take 1,000 mg by mouth every 8 (eight) hours as needed for mild pain.        albuterol (VENTOLIN HFA) 108 (90 Base) MCG/ACT inhaler INHALE 2 PUFFS INTO THE LUNGS EVERY 6 HOURS AS NEEDED FOR WHEEZING OR SHORTNESS OF BREATH 54 g 0   allopurinol (ZYLOPRIM) 300 MG tablet Take 1 tablet (300 mg total) by mouth daily. 90 tablet  3   ammonium lactate (LAC-HYDRIN) 12 % lotion Apply 1 application topically daily as needed for dry skin.       atorvastatin (LIPITOR) 40 MG tablet Take 1 tablet (40 mg total) by mouth at bedtime. 90 tablet 3   buPROPion (WELLBUTRIN XL) 300 MG 24 hr tablet Take 1 tablet (300 mg total) by mouth daily. 90 tablet 1   diltiazem (TIAZAC) 180 MG 24 hr capsule Take 1 capsule (180 mg total) by mouth daily. 90 capsule 3   ELIQUIS 5 MG TABS tablet TAKE ONE TABLET BY MOUTH TWICE A DAY 180 tablet 1   finasteride (PROSCAR) 5 MG tablet Take 5 mg by mouth daily.       fluticasone (FLONASE) 50 MCG/ACT nasal spray Place 2 sprays into both nostrils daily.       HYDROcodone-acetaminophen (NORCO/VICODIN) 5-325 MG tablet Take 1 tablet by mouth every 6 (six) hours as needed for moderate pain. 15 tablet 0   loratadine (CLARITIN) 10 MG tablet Take 10 mg by mouth daily.       LORazepam (ATIVAN) 0.5 MG tablet TAKE 1 TABLET TWICE DAILY AS NEEDED FOR ANXIETY. 20 tablet 0   metFORMIN (GLUCOPHAGE) 500 MG tablet Take 1 tablet (500 mg total) by mouth 2 (two) times daily with a meal. 180 tablet 3   Multiple Vitamins-Minerals (MULTIVITAMIN WITH MINERALS) tablet Take 1 tablet by mouth daily.       olopatadine (PATANOL) 0.1 % ophthalmic  solution Place 1 drop into both eyes daily as needed for allergies (tearing).        Omega-3 Fatty Acids (FISH OIL) 1000 MG CAPS Take 1,000 mg by mouth daily.       omeprazole (PRILOSEC) 20 MG capsule Take 20 mg by mouth daily.       sertraline (ZOLOFT) 50 MG tablet Take 1 tablet (50 mg total) by mouth daily. 90 tablet 1   sildenafil (VIAGRA) 100 MG tablet Take 1 tablet (100 mg total) by mouth daily as needed for erectile dysfunction. 20 tablet 1   tamsulosin (FLOMAX) 0.4 MG CAPS capsule Take 0.8 mg by mouth in the morning and at bedtime.       zolpidem (AMBIEN) 5 MG tablet Take 1 tablet (5 mg total) by mouth at bedtime as needed for sleep. 15 tablet 1   metoprolol tartrate (LOPRESSOR) 50 MG tablet Take 1 tablet (50 mg total) by mouth once for 1 dose. Take 1-2 hours prior to your CT scans on 08/25/2021. 1 tablet 0             Current Facility-Administered Medications  Medication Dose Route Frequency Provider Last Rate Last Admin   sodium chloride flush (NS) 0.9 % injection 3 mL  3 mL Intravenous Q12H Dunn, Dayna N, PA-C              Allergies  Allergen Reactions   Oxycodone Itching          Review of Systems:               General:                      normal appetite, + decreased energy, no weight gain, no weight loss, no fever             Cardiac:                       no chest pain with exertion, no chest pain at  rest, +SOB with mild exertion, no resting SOB, no PND, no orthopnea, no palpitations, + arrhythmia, + atrial fibrillation, + LE edema, + dizzy spells, no syncope             Respiratory:                 + shortness of breath, no home oxygen, no productive cough, no dry cough, no bronchitis, no wheezing, no hemoptysis, no asthma, no pain with inspiration or cough, + sleep apnea, no CPAP at night             GI:                               no difficulty swallowing, no reflux, no frequent heartburn, no hiatal hernia, no abdominal pain, no constipation, no diarrhea, no  hematochezia, no hematemesis, no melena             GU:                              no dysuria,  no frequency, no urinary tract infection, no hematuria, + enlarged prostate, + kidney stones, no kidney disease             Vascular:                     no pain suggestive of claudication, no pain in feet, no leg cramps, no varicose veins, no DVT, no non-healing foot ulcer             Neuro:                         no stroke, no TIA's, no seizures, + headaches, no temporary blindness one eye,  no slurred speech, no peripheral neuropathy, no chronic pain, no instability of gait, no memory/cognitive dysfunction             Musculoskeletal:         + arthritis - primarily involving the knees, no joint swelling, no myalgias, no difficulty walking, normal mobility              Skin:                            no rash, no itching, no skin infections, no pressure sores or ulcerations             Psych:                         + anxiety, + depression, + nervousness, no unusual recent stress             Eyes:                           no blurry vision, no floaters, no recent vision changes, no glasses or contacts             ENT:                            no hearing loss, no loose or painful teeth, no dentures, last saw dentist 10/22             Hematologic:  no easy bruising, no abnormal bleeding, no clotting disorder, no frequent epistaxis             Endocrine:                   + diabetes, does not check CBG's at home                            Physical Exam:               BP 129/66 (BP Location: Right Arm, Patient Position: Sitting)   Pulse 73   Resp 20   Ht 6\' 1"  (1.854 m)   Wt 231 lb (104.8 kg)   SpO2 93% Comment: RA  BMI 30.48 kg/m              General:                      well-appearing             HEENT:                       Unremarkable, NCAT, PERLA, EOMI             Neck:                           no JVD, + transmitted murmur or bruits bilaterally, no adenopathy               Chest:                          clear to auscultation, symmetrical breath sounds, no wheezes, no rhonchi              CV:                              RRR, 3/6 systolic murmur RSB, no diastolic murmur             Abdomen:                    soft, non-tender, no masses              Extremities:                 warm, well-perfused, pulses palpable at ankle, no lower extremity edema             Rectal/GU                   Deferred             Neuro:                         Grossly non-focal and symmetrical throughout             Skin:                            Clean and dry, no rashes, no breakdown   Diagnostic Tests:   ECHOCARDIOGRAM REPORT         Patient Name:   [name] Date of Exam: 07/20/2021  Medical Rec #:  [id number]     Height:  72.0 in  Accession #:    [id number]    Weight:       235.0 lb  Date of Birth:  [birth year]     BSA:          2.282 m  Patient Age:    81 years      BP:           151/78 mmHg  Patient Gender: M             HR:           70 bpm.  Exam Location:  [facility]   Procedure: 2D Echo, Cardiac Doppler and Color Doppler   Indications:    I35.0 AS     History:        Patient has prior history of Echocardiogram examinations,  most                  recent 01/13/2021. AS, Arrythmias:Atrial Fibrillation; Risk                  Factors:Obesity, Hypertension, Diabetes and Dyslipidemia.     Sonographer:    Coralyn Helling RDCS  Referring Phys: [place]     1. Left ventricular ejection fraction, by estimation, is 60 to 65%. The  left ventricle has normal function. The left ventricle has no regional  wall motion abnormalities. There is moderate asymmetric left ventricular  hypertrophy of the basal-septal  segment. Left ventricular diastolic parameters are consistent with Grade I  diastolic dysfunction (impaired relaxation).   2. Right ventricular systolic function is normal. The right ventricular  size is normal. Tricuspid  regurgitation signal is inadequate for assessing  PA pressure.   3. Left atrial size was moderately dilated.   4. The mitral valve is grossly normal. Mild mitral valve regurgitation.   5. The aortic valve is tricuspid. There is moderate calcification of the  aortic valve. Aortic valve regurgitation is mild. Severe aortic valve  stenosis. Aortic valve area, by VTI measures 0.98 cm. Aortic valve mean  gradient measures 44.0 mmHg. Aortic  valve Vmax measures 4.54 m/s. Peak gradient of 82 mmHg. DI is 0.20.   6. The inferior vena cava is normal in size with <50% respiratory  variability, suggesting right atrial pressure of 8 mmHg.   Comparison(s): Changes from prior study are noted. 01/13/2021: LVEF 60-65%,  moderate to severe AS - mean gradient 37 mmHg.   FINDINGS   Left Ventricle: Left ventricular ejection fraction, by estimation, is 60  to 65%. The left ventricle has normal function. The left ventricle has no  regional wall motion abnormalities. The left ventricular internal cavity  size was normal in size. There is   moderate asymmetric left ventricular hypertrophy of the basal-septal  segment. Left ventricular diastolic parameters are consistent with Grade I  diastolic dysfunction (impaired relaxation). Indeterminate filling  pressures.   Right Ventricle: The right ventricular size is normal. No increase in  right ventricular wall thickness. Right ventricular systolic function is  normal. Tricuspid regurgitation signal is inadequate for assessing PA  pressure.   Left Atrium: Left atrial size was moderately dilated.   Right Atrium: Right atrial size was normal in size.   Pericardium: There is no evidence of pericardial effusion.   Mitral Valve: The mitral valve is grossly normal. Mild mitral valve  regurgitation.   Tricuspid Valve: The tricuspid valve is grossly normal. Tricuspid valve  regurgitation is trivial.   Aortic Valve: The right  coronary cusp appears immobile. The  aortic valve  is tricuspid. There is moderate calcification of the aortic valve. Aortic  valve regurgitation is mild. Severe aortic stenosis is present. Aortic  valve mean gradient measures 44.0  mmHg. Aortic valve peak gradient measures 82.4 mmHg. Aortic valve area, by  VTI measures 0.98 cm.   Pulmonic Valve: The pulmonic valve was grossly normal. Pulmonic valve  regurgitation is trivial.   Aorta: The aortic root and ascending aorta are structurally normal, with  no evidence of dilitation.   Venous: The inferior vena cava is normal in size with less than 50%  respiratory variability, suggesting right atrial pressure of 8 mmHg.   IAS/Shunts: No atrial level shunt detected by color flow Doppler.      LEFT VENTRICLE  PLAX 2D  LVIDd:         4.70 cm  Diastology  LVIDs:         3.30 cm  LV e' medial:    7.29 cm/s  LV PW:         1.10 cm  LV E/e' medial:  15.5  LV IVS:        1.90 cm  LV e' lateral:   10.20 cm/s  LVOT diam:     2.50 cm  LV E/e' lateral: 11.1  LV SV:         92  LV SV Index:   40  LVOT Area:     4.91 cm      RIGHT VENTRICLE         IVC  TAPSE (M-mode): 2.1 cm  IVC diam: 2.00 cm   LEFT ATRIUM              Index       RIGHT ATRIUM           Index  LA diam:        5.40 cm  2.37 cm/m  RA Pressure: 3.00 mmHg  LA Vol (A2C):   109.0 ml 47.77 ml/m RA Area:     12.30 cm  LA Vol (A4C):   94.1 ml  41.24 ml/m RA Volume:   26.00 ml  11.39 ml/m  LA Biplane Vol: 103.0 ml 45.14 ml/m   AORTIC VALVE  AV Area (Vmax):    1.09 cm  AV Area (Vmean):   1.00 cm  AV Area (VTI):     0.98 cm  AV Vmax:           454.00 cm/s  AV Vmean:          304.000 cm/s  AV VTI:            0.934 m  AV Peak Grad:      82.4 mmHg  AV Mean Grad:      44.0 mmHg  LVOT Vmax:         101.00 cm/s  LVOT Vmean:        62.000 cm/s  LVOT VTI:          0.187 m  LVOT/AV VTI ratio: 0.20     AORTA  Ao Root diam: 3.20 cm  Ao Asc diam:  3.40 cm   MITRAL VALVE                TRICUSPID VALVE  MV Area  (PHT): 3.19 cm     Estimated RAP:  3.00 mmHg  MV Decel Time: 238 msec  MV E velocity: 113.00 cm/s  SHUNTS  MV A velocity: 103.00 cm/s  Systemic  VTI:  0.19 m  MV E/A ratio:  1.10         Systemic Diam: 2.50 cm   Lyman Bishop MD  Electronically signed by Lyman Bishop MD  Signature Date/Time: 07/20/2021/1:10:02 PM         Final       Physicians   Panel Physicians Referring Physician Case Authorizing Physician  Belva Crome, MD (Primary)        Procedures   RIGHT/LEFT HEART CATH AND CORONARY ANGIOGRAPHY    Conclusion   CONCLUSIONS: Normal left main 40% proximal RCA stenosis.  Right dominant anatomy. Widely patent circumflex. 20 to 30% mid LAD.  The LAD reaches the left ventricular apex. Normal pulmonary artery pressures, mean 23 mmHg. Pulmonary capillary wedge pressure mean, 13 mmHg.   RECOMMENDATIONS: Referred to the aortic valve clinic for consideration of TAVR versus SAVR   Surgeon Notes       08/04/2021  1:17 PM CV Procedure signed by Belva Crome, MD    Procedural Details   Technical Details The right radial area was sterilely prepped and draped. Intravenous sedation with Versed and fentanyl was administered. 1% Xylocaine was infiltrated to achieve local analgesia. Using real-time vascular ultrasound, a double wall stick with an angiocath was utilized to obtain intra-arterial access. A VUS image was saved for the permanent record.The modified Seldinger technique was used to place a 13F " Slender" sheath in the right radial artery. Weight based heparin was administered. Coronary angiography was done using 5 F catheters. Right coronary angiography was performed with a JR4.  Angulation and tortuosity of the subclavian/innominate/aortic path, led to significant difficulty with catheter manipulation and control.  Using a deep held breath, the right coronary was visualized demonstrating less than 50% stenosis in the proximal vessel.  Likewise the left coronary was difficult  to cannulate due to tortuosity but was eventually successful using a JL 3.5 cm diagnostic catheter.  Left ventricular hemodymic recordings and angiography was not performed.  Serial echocardiogram studies have demonstrated progression and now severe calcific aortic stenosis.  The risk of crossing the valve and having embolic complications outweighed any benefit of obtaining a left ventricular end-diastolic pressure.   Right heart catheterization was performed by exchanging a previously placed antecubital IV angio-cath for a 5 French Slender sheath. 1% Xylocaine was used to locally nesthetize the area around the IV site. The IV catheter was wired using an .018 guidewire. The modified Seldinger technique was used to place the 5 [place] sheath. Double glove technique was used to enhance sterility. After sheath insertion, right heart cath was performed using a 5 French balloon tipped catheter and fluoroscopic guidance. Pressures were recorded in each chamber and in the pulmonary capillary wedge position.. The main pulmonary artery O2 saturation was sampled.   Hemostasis was achieved using a pneumatic band.  During this procedure the patient is administered a total of Versed 1.5 mg and Fentanyl 50 mcg to achieve and maintain moderate conscious sedation.  The patient's heart rate, blood pressure, and oxygen saturation are monitored continuously during the procedure. The period of conscious sedation is 40 minutes, of which I was present face-to-face 100% of this time.  Estimated blood loss <50 mL.   During this procedure medications were administered to achieve and maintain moderate conscious sedation while the patient's heart rate, blood pressure, and oxygen saturation were continuously monitored and I was present face-to-face 100% of this time.    Medications (Filter: Administrations occurring from 1211 to 1320 on 08/04/21)  important  Continuous medications are totaled by the amount administered until  08/04/21 1320.    Heparin (Porcine) in NaCl 2000-0.9 UNIT/L-% SOLN (mL) Total volume:  1,000 mL Date/Time Rate/Dose/Volume Action    08/04/21 1223 1,000 mL Given      midazolam (VERSED) injection (mg) Total dose:  1.5 mg Date/Time Rate/Dose/Volume Action    08/04/21 1224 1 mg Given    1236 0.5 mg Given      fentaNYL (SUBLIMAZE) injection (mcg) Total dose:  50 mcg Date/Time Rate/Dose/Volume Action    08/04/21 1224 25 mcg Given    1236 25 mcg Given      lidocaine (PF) (XYLOCAINE) 1 % injection (mL) Total volume:  4 mL Date/Time Rate/Dose/Volume Action    08/04/21 1237 4 mL Given      Radial Cocktail/Verapamil only (mL) Total volume:  10 mL Date/Time Rate/Dose/Volume Action    08/04/21 1244 10 mL Given      heparin sodium (porcine) injection (Units) Total dose:  5,000 Units Date/Time Rate/Dose/Volume Action    08/04/21 1250 5,000 Units Given      iohexol (OMNIPAQUE) 350 MG/ML injection (mL) Total volume:  65 mL Date/Time Rate/Dose/Volume Action    08/04/21 1309 65 mL Given      Sedation Time   Sedation Time Physician-1: 43 minutes 19 seconds Contrast   Medication Name Total Dose  iohexol (OMNIPAQUE) 350 MG/ML injection 65 mL    Radiation/Fluoro   Fluoro time: 7.6 (min) DAP: 16.8 (Gycm2) Cumulative Air Kerma: 789.3 (mGy) Complications      Complications documented before study signed (08/04/2021  1:29 PM)     RIGHT/LEFT HEART CATH AND CORONARY ANGIOGRAPHY   None Documented by Belva Crome, MD 08/04/2021  1:28 PM  Date Found: 08/04/2021  Time Range: Intraprocedure          Coronary Findings   Diagnostic Dominance: Right Left Anterior Descending  Prox LAD to Mid LAD lesion is 25% stenosed.  Right Coronary Artery  Prox RCA lesion is 40% stenosed.  Intervention   No interventions have been documented. Right Heart   Right Heart Pressures Hemodynamic findings consistent with pulmonary hypertension.    Left Heart   Aortic Valve There is severe  aortic valve stenosis. The aortic valve is calcified.    Coronary Diagrams   Diagnostic Dominance: Right Intervention   Implants      No implant documentation for this case.    Syngo Images    Show images for CARDIAC CATHETERIZATION Images on Long Term Storage    Show images for Chaise, Mahabir to Procedure Log   Procedure Log    Hemo Data   Flowsheet Row Most Recent Value  Fick Cardiac Output 5.91 L/min  Fick Cardiac Output Index 2.59 (L/min)/BSA  RA A Wave 10 mmHg  RA V Wave 5 mmHg  RA Mean 4 mmHg  RV Systolic Pressure 32 mmHg  RV Diastolic Pressure 3 mmHg  RV EDP 7 mmHg  PA Systolic Pressure 31 mmHg  PA Diastolic Pressure 18 mmHg  PA Mean 23 mmHg  PW A Wave 16 mmHg  PW V Wave 16 mmHg  PW Mean 13 mmHg  AO Systolic Pressure 810 mmHg  AO Diastolic Pressure 66 mmHg  AO Mean 89 mmHg  QP/QS 1  TPVR Index 8.89 HRUI  TSVR Index 34.41 HRUI  PVR SVR Ratio 0.12  TPVR/TSVR Ratio 0.26    Impression:   ADDENDUM REPORT: 08/25/2021 16:38   ADDENDUM: Please see separate dictation for contemporaneously  obtained CTA chest, abdomen and pelvis dated 08/25/2021 for full description of relevant extracardiac findings.     Electronically Signed   By: Vinnie Langton M.D.   On: 08/25/2021 16:38    Addended by Etheleen Mayhew, MD on 08/25/2021  4:41 PM    Study Result   Narrative & Impression  CLINICAL DATA:  Aortic Stenosis   EXAM: Cardiac TAVR CT   TECHNIQUE: The patient was scanned on a Siemens Force 588 slice scanner. A 120 kV retrospective scan was triggered in the ascending thoracic aorta at 140 HU's. Gantry rotation speed was 250 msecs and collimation was .6 mm. No beta blockade or nitro were given. The 3D data set was reconstructed in 5% intervals of the R-R cycle. Systolic and diastolic phases were analyzed on a dedicated work station using MPR, MIP and VRT modes. The patient received 80 cc of contrast.   FINDINGS: Aortic Valve: Tri leaflet AV  heavily calcified with score 5130   Aorta: No aneurysm Bovine Arch mild calcific atherosclerosis   Sino-tubular Junction: 28 mm   Ascending Thoracic Aorta: 35 mm   Aortic Arch: 29 mm   Descending Thoracic Aorta: 26 mm   Sinus of Valsalva Measurements:   Non-coronary: 33.7 mm   Right - coronary: 30.4 mm sinus height 17.7 mm   Left -   coronary: 30.7 mm sinus height 21.2 mm   Coronary Artery Height above Annulus:   Left Main: 13.2 mm above annulus   Right Coronary: 14.1 mm above annulus   Virtual Basal Annulus Measurements:   Maximum / Minimum Diameter: 32.1 mm x 25.9 mm   Perimeter: 94.1 mm   Area: 669 mm2   Coronary Arteries: Sufficient height above annulus for deployment   Optimum Fluoroscopic Angle for Delivery: LAO 14 Caudal 14 degrees   IMPRESSION: 1. Tri-leaflet AV with calcium score 5130   2. Optimum angiographic angle for deployment LAO 14 Caudal 14 degrees   3.  Annular area of 669 mm2 upper limit of 29 mm Sapien 3 valve   4. Concern for marked annular and subvalvular calcium extending across inter-valvular fibrosa to mid anterior mitral leaflet   5.  Coronary arteries sufficient height above annulus for deployment   Jenkins Rouge   Electronically Signed: By: Jenkins Rouge M.D. On: 08/25/2021 15:21      Plan:   This 70 year old gentleman has stage D, severe, symptomatic aortic stenosis with New York Heart Association class II symptoms of exertional fatigue and shortness of breath consistent with chronic diastolic congestive heart failure.  I have personally reviewed his 2D echocardiogram, cardiac catheterization, and CTA studies.  His echocardiogram shows a heavily calcified trileaflet aortic valve with restricted mobility.  The mean gradient has increased to 44 mmHg with a valve area of 0.98 cm consistent with severe aortic stenosis.  Left ventricular systolic function is normal with moderate asymmetric LVH and grade 1 diastolic dysfunction.   Cardiac catheterization shows mild nonobstructive coronary disease with normal right heart pressures.  I agree that aortic valve replacement is indicated in this patient for relief of his symptoms and to prevent progressive left ventricular dysfunction.  His gated cardiac CTA shows a trileaflet aortic valve with a calcium score of 5130 with marked annular and subvalvular calcium extending across the antral valvular fibrosa to the midportion of the anterior mitral leaflet.  With this degree of calcification I think transcatheter aortic valve replacement would have a significant risk of paravalvular leak.  I think open surgical aortic  valve replacement would have the best long-term result for him.  I discussed both TAVR and open surgical aortic valve replacement with him and his wife including the risk and benefits of both.  He is in agreement with proceeding with open surgical aortic valve replacement using a bioprosthetic valve.   I discussed the operative procedure with the patient and family including alternatives, benefits and risks; including but not limited to bleeding, blood transfusion, infection, stroke, myocardial infarction, graft failure, heart block requiring a permanent pacemaker, organ dysfunction, and death.  SHAWNMICHAEL PARENTEAU understands and agrees to proceed.  We will schedule surgery for 09/20/2021.  I instructed him to discontinue his Eliquis as of today and he will hold his metformin for 48 hours preoperatively.      [name], MD

## 2021-09-20 NOTE — Interval H&P Note (Signed)
History and Physical Interval Note:  09/20/2021 12:20 PM  Jeremy Tucker  has presented today for surgery, with the diagnosis of AS.  The various methods of treatment have been discussed with the patient and family. After consideration of risks, benefits and other options for treatment, the patient has consented to  Procedure(s): AORTIC VALVE REPLACEMENT (AVR) (N/A) CLIPPING OF ATRIAL APPENDAGE (N/A) TRANSESOPHAGEAL ECHOCARDIOGRAM (TEE) (N/A) as a surgical intervention.  The patient's history has been reviewed, patient examined, no change in status, stable for surgery.  I have reviewed the patient's chart and labs.  Questions were answered to the patient's satisfaction.     [name]

## 2021-09-20 NOTE — Transfer of Care (Signed)
Immediate Anesthesia Transfer of Care Note  Patient: Jeremy Tucker  Procedure(s) Performed: AORTIC VALVE REPLACEMENT (AVR) USING 25 MM INSPIRIS RESILIA  AORTIC VALVE (Chest) CLIPPING OF ATRIAL APPENDAGE USING ATRICURE 35 MM ATRICLIP (Left) TRANSESOPHAGEAL ECHOCARDIOGRAM (TEE) APPLICATION OF CELL SAVER  Patient Location: ICU  Anesthesia Type:General  Level of Consciousness: Patient remains intubated per anesthesia plan  Airway & Oxygen Therapy: Patient remains intubated per anesthesia plan and Patient placed on Ventilator (see vital sign flow sheet for setting)  Post-op Assessment: Report given to RN and Post -op Vital signs reviewed and stable  Post vital signs: Reviewed and stable  Last Vitals:  Vitals Value Taken Time  BP 115/80 09/20/21 1800  Temp 36.1 C 09/20/21 1800  Pulse 80 09/20/21 1800  Resp 21 09/20/21 1800  SpO2 95 % 09/20/21 1800  Vitals shown include unvalidated device data.  Last Pain:  Vitals:   09/20/21 0951  TempSrc:   PainSc: 0-No pain      Patients Stated Pain Goal: 1 (00/51/10 2111)  Complications: No notable events documented.

## 2021-09-20 NOTE — Anesthesia Procedure Notes (Signed)
Central Venous Catheter Insertion Performed by: Oleta Mouse, MD, anesthesiologist Start/End11/05/2021 10:34 AM, 09/20/2021 10:45 AM Patient location: Pre-op. Preanesthetic checklist: patient identified, IV checked, site marked, risks and benefits discussed, surgical consent, monitors and equipment checked, pre-op evaluation, timeout performed and anesthesia consent Lidocaine 1% used for infiltration and patient sedated Hand hygiene performed  and maximum sterile barriers used  Catheter size: 8.5 Fr Sheath introducer Procedure performed using ultrasound guided technique. Ultrasound Notes:anatomy identified, needle tip was noted to be adjacent to the nerve/plexus identified, no ultrasound evidence of intravascular and/or intraneural injection and image(s) printed for medical record Attempts: 1 Following insertion, line sutured, dressing applied and Biopatch. Post procedure assessment: blood return through all ports, free fluid flow and no air  Patient tolerated the procedure well with no immediate complications.

## 2021-09-20 NOTE — Op Note (Signed)
CARDIOVASCULAR SURGERY OPERATIVE NOTE  09/20/2021 Jeremy Tucker [id number]  Surgeon:  [name], MD  First Assistant: Ellwood Handler,  PA-C:  An experienced assistant was required given the complexity of this surgery and the standard of surgical care. The assistant was needed for exposure, dissection, suctioning, retraction of delicate tissues and sutures, instrument exchange and for overall help during this procedure.    Preoperative Diagnosis:  Severe aortic stenosis   Postoperative Diagnosis:  Same   Procedure:  Median Sternotomy Extracorporeal circulation 3.   Aortic valve replacement using a 25 mm Edwards INSPIRIS RESILIA pericardial valve. 4.   Clipping of left atrial appendage.  Anesthesia:  General Endotracheal   Clinical History/Surgical Indication:  This 70 year old gentleman has stage D, severe, symptomatic aortic stenosis with New York Heart Association class II symptoms of exertional fatigue and shortness of breath consistent with chronic diastolic congestive heart failure.  I have personally reviewed his 2D echocardiogram, cardiac catheterization, and CTA studies.  His echocardiogram shows a heavily calcified trileaflet aortic valve with restricted mobility.  The mean gradient has increased to 44 mmHg with a valve area of 0.98 cm consistent with severe aortic stenosis.  Left ventricular systolic function is normal with moderate asymmetric LVH and grade 1 diastolic dysfunction.  Cardiac catheterization shows mild nonobstructive coronary disease with normal right heart pressures.  I agree that aortic valve replacement is indicated in this patient for relief of his symptoms and to prevent progressive left ventricular dysfunction.  His gated cardiac CTA shows a trileaflet aortic valve with a calcium score of 5130 with marked annular and subvalvular calcium extending across the antral valvular fibrosa to the midportion of the anterior mitral leaflet.  With this degree of  calcification I think transcatheter aortic valve replacement would have a significant risk of paravalvular leak.  I think open surgical aortic valve replacement would have the best long-term result for him.  I discussed both TAVR and open surgical aortic valve replacement with him and his wife including the risk and benefits of both.  He is in agreement with proceeding with open surgical aortic valve replacement using a bioprosthetic valve.   I discussed the operative procedure with the patient and family including alternatives, benefits and risks; including but not limited to bleeding, blood transfusion, infection, stroke, myocardial infarction, graft failure, heart block requiring a permanent pacemaker, organ dysfunction, and death.  Jeremy Tucker understands and agrees to proceed.   Preparation:  The patient was seen in the preoperative holding area and the correct patient, correct operation were confirmed with the patient after reviewing the medical record and catheterization. The consent was signed by me. Preoperative antibiotics were given. A pulmonary arterial line and radial arterial line were placed by the anesthesia team. The patient was taken back to the operating room and positioned supine on the operating room table. After being placed under general endotracheal anesthesia by the anesthesia team a foley catheter was placed. The neck, chest, abdomen, and both legs were prepped with betadine soap and solution and draped in the usual sterile manner. A surgical time-out was taken and the correct patient and operative procedure were confirmed with the nursing and anesthesia staff.   Pre-bypass TEE:   Complete TEE assessment was performed by Dr. Laurie Panda. This showed severe aortic stenosis with mild AI. LV systolic function normal with moderate LVH. Mild MR    Post-bypass TEE:   Normal functioning prosthetic aortic valve with no perivalvular leak or regurgitation through the valve. Left  ventricular function preserved. Unchanged mild mitral regurgitation.    Cardiopulmonary Bypass:  A median sternotomy was performed. The pericardium was opened in the midline. Right ventricular function appeared normal. The ascending aorta was of normal size and had no palpable plaque. There were no contraindications to aortic cannulation or cross-clamping. The patient was fully systemically heparinized and the ACT was maintained > 400 sec. The proximal aortic arch was cannulated with a 55 F aortic cannula for arterial inflow. Venous cannulation was performed via the right atrial appendage using a two-staged venous cannula. An antegrade cardioplegia/vent cannula was inserted into the mid-ascending aorta. A left ventricular vent was placed via the right superior pulmonary vein. Aortic occlusion was performed with a single cross-clamp. Systemic cooling to 32 degrees Centigrade and topical cooling of the heart with iced saline were used. Antegrade cold KBC cardioplegia was used to induce diastolic arrest and then warm antegrade KBC cardioplegia was given prior to removing the cross clamp for reanimation. A temperature probe was inserted into the interventricular septum and an insulating pad was placed in the pericardium. Carbon dioxide was insufflated into the pericardium at 5L/min throughout the procedure to minimize intracardiac air.  Ligation of left atrial appendage:   The base of the appendage was measured and a 35 mm Atricure Atriclip Pro 2 clip was chosen. This had model number [id number], Lot number [id number]. This was placed across the base of the LAA without difficulty.     Aortic Valve Replacement:   A transverse aortotomy was performed 1 cm above the take-off of the right coronary artery. The native valve was trileaflet with severely calcified leaflets and severe annular calcification. The ostia of the coronary arteries were in normal position and were not obstructed. The native valve leaflets were  excised and the annulus was decalcified with rongeurs. Care was taken to remove all particulate debris. The left ventricle was directly inspected for debris and then irrigated with ice saline solution. The annulus was sized and a size 25 mm Edwards INSPIRIS RESILIA valve was chosen. The model number was [id number]  and the serial number was [id number]. While the valve was being prepared 2-0 Ethibond pledgeted horizontal mattress sutures were placed around the annulus with the pledgets in a sub-annular position. The sutures were placed through the sewing ring and the valve lowered into place. The sutures were tied sequentially. The valve seated nicely and the coronary ostia were not obstructed. The prosthetic valve leaflets moved normally and there was no sub-valvular obstruction. The aortotomy was closed using 4-0 Prolene suture in 2 layers with felt strips to reinforce the closure.  Completion:  The patient was rewarmed to 37 degrees Centigrade. De-airing maneuvers were performed and the head placed in trendelenburg position. The crossclamp was removed with a time of 80 minutes. There was spontaneous return of sinus rhythm. The aortotomy was checked for hemostasis. Two temporary epicardial pacing wires were placed on the right atrium and two on the right ventricle. The left ventricular vent and retrograde cardioplegia cannulas were removed. The patient was weaned from CPB without difficulty on no inotropes. CPB time was 103 minutes. Cardiac output was 5 LPM. Heparin was fully reversed with protamine and the aortic and venous cannulas removed. Hemostasis was achieved. Mediastinal drainage tubes were placed. The sternum was closed with double #6 stainless steel wires. The fascia was closed with continuous # 1 vicryl suture. The subcutaneous tissue was closed with 2-0 vicryl continuous suture. The skin was closed with 3-0 vicryl subcuticular suture. All sponge,  needle, and instrument counts were reported correct at the  end of the case. Dry sterile dressings were placed over the incisions and around the chest tubes which were connected to pleurevac suction. The patient was then transported to the surgical intensive care unit in stable condition.

## 2021-09-20 NOTE — Anesthesia Preprocedure Evaluation (Signed)
Anesthesia Evaluation  Patient identified by MRN, date of birth, ID band Patient awake    Reviewed: Allergy & Precautions, NPO status , Patient's Chart, lab work & pertinent test results  History of Anesthesia Complications Negative for: history of anesthetic complications  Airway Mallampati: III  TM Distance: >3 FB Neck ROM: Full    Dental  (+) Teeth Intact, Dental Advisory Given   Pulmonary shortness of breath, asthma , sleep apnea ,    breath sounds clear to auscultation       Cardiovascular hypertension, + CAD  + dysrhythmias Atrial Fibrillation + Valvular Problems/Murmurs AS  Rhythm:Regular + Systolic murmurs 1. Left ventricular ejection fraction, by estimation, is 60 to 65%. The  left ventricle has normal function. The left ventricle has no regional  wall motion abnormalities. There is moderate asymmetric left ventricular  hypertrophy of the basal-septal  segment. Left ventricular diastolic parameters are consistent with Grade I  diastolic dysfunction (impaired relaxation).  2. Right ventricular systolic function is normal. The right ventricular  size is normal. Tricuspid regurgitation signal is inadequate for assessing  PA pressure.  3. Left atrial size was moderately dilated.  4. The mitral valve is grossly normal. Mild mitral valve regurgitation.  5. The aortic valve is tricuspid. There is moderate calcification of the  aortic valve. Aortic valve regurgitation is mild. Severe aortic valve  stenosis. Aortic valve area, by VTI measures 0.98 cm. Aortic valve mean  gradient measures 44.0 mmHg. Aortic  valve Vmax measures 4.54 m/s. Peak gradient of 82 mmHg. DI is 0.20.  6. The inferior vena cava is normal in size with <50% respiratory  variability, suggesting right atrial pressure of 8 mmHg.   ? Nuclear stress EF: 53%. ? Blood pressure demonstrated a normal response to exercise. ? There was no ST segment deviation  noted during stress. ? No T wave inversion was noted during stress. ? The left ventricular ejection fraction is mildly decreased (45-54%). ? The study is normal. ? This is a low risk study.  ? Normal left main ? 40% proximal RCA stenosis.  Right dominant anatomy. ? Widely patent circumflex. ? 20 to 30% mid LAD.  The LAD reaches the left ventricular apex. ? Normal pulmonary artery pressures, mean 23 mmHg. ? Pulmonary capillary wedge pressure mean, 13 mmHg.     Neuro/Psych PSYCHIATRIC DISORDERS Anxiety Depression  Neuromuscular disease    GI/Hepatic Neg liver ROS, hiatal hernia, GERD  Medicated and Controlled,  Endo/Other  diabetes, Type 2  Renal/GU Renal disease     Musculoskeletal  (+) Arthritis ,   Abdominal   Peds  Hematology negative hematology ROS (+) Lab Results      Component                Value               Date                      WBC                      8.3                 09/16/2021                HGB                      13.5  09/16/2021                HCT                      39.4                09/16/2021                MCV                      93.1                09/16/2021                PLT                      185                 09/16/2021              Anesthesia Other Findings   Reproductive/Obstetrics                             Anesthesia Physical Anesthesia Plan  ASA: 4  Anesthesia Plan: General   Post-op Pain Management:    Induction: Intravenous  PONV Risk Score and Plan: 2 and Ondansetron and Treatment may vary due to age or medical condition  Airway Management Planned: Oral ETT  Additional Equipment: CVP, Arterial line, PA Cath, TEE and Ultrasound Guidance Line Placement  Intra-op Plan:   Post-operative Plan: Post-operative intubation/ventilation  Informed Consent: I have reviewed the patients History and Physical, chart, labs and discussed the procedure including the risks, benefits  and alternatives for the proposed anesthesia with the patient or authorized representative who has indicated his/her understanding and acceptance.     Dental advisory given  Plan Discussed with: CRNA and Anesthesiologist  Anesthesia Plan Comments:         Anesthesia Quick Evaluation

## 2021-09-21 ENCOUNTER — Encounter (HOSPITAL_COMMUNITY): Payer: Self-pay | Admitting: Surgery

## 2021-09-21 ENCOUNTER — Inpatient Hospital Stay (HOSPITAL_COMMUNITY): Payer: PPO

## 2021-09-21 LAB — POCT I-STAT 7, (LYTES, BLD GAS, ICA,H+H)
Acid-Base Excess: 0 mmol/L (ref 0.0–2.0)
Acid-base deficit: 1 mmol/L (ref 0.0–2.0)
Acid-base deficit: 1 mmol/L (ref 0.0–2.0)
Bicarbonate: 24.7 mmol/L (ref 20.0–28.0)
Bicarbonate: 25.5 mmol/L (ref 20.0–28.0)
Bicarbonate: 25.9 mmol/L (ref 20.0–28.0)
Calcium, Ion: 1.09 mmol/L — ABNORMAL LOW (ref 1.15–1.40)
Calcium, Ion: 1.13 mmol/L — ABNORMAL LOW (ref 1.15–1.40)
Calcium, Ion: 1.13 mmol/L — ABNORMAL LOW (ref 1.15–1.40)
HCT: 33 % — ABNORMAL LOW (ref 39.0–52.0)
HCT: 34 % — ABNORMAL LOW (ref 39.0–52.0)
HCT: 36 % — ABNORMAL LOW (ref 39.0–52.0)
Hemoglobin: 11.2 g/dL — ABNORMAL LOW (ref 13.0–17.0)
Hemoglobin: 11.6 g/dL — ABNORMAL LOW (ref 13.0–17.0)
Hemoglobin: 12.2 g/dL — ABNORMAL LOW (ref 13.0–17.0)
O2 Saturation: 96 %
O2 Saturation: 96 %
O2 Saturation: 97 %
Patient temperature: 36.1
Patient temperature: 37.3
Patient temperature: 37.4
Potassium: 3.8 mmol/L (ref 3.5–5.1)
Potassium: 4.3 mmol/L (ref 3.5–5.1)
Potassium: 4.3 mmol/L (ref 3.5–5.1)
Sodium: 139 mmol/L (ref 135–145)
Sodium: 140 mmol/L (ref 135–145)
Sodium: 140 mmol/L (ref 135–145)
TCO2: 26 mmol/L (ref 22–32)
TCO2: 27 mmol/L (ref 22–32)
TCO2: 27 mmol/L (ref 22–32)
pCO2 arterial: 44.1 mmHg (ref 32.0–48.0)
pCO2 arterial: 44.5 mmHg (ref 32.0–48.0)
pCO2 arterial: 50 mmHg — ABNORMAL HIGH (ref 32.0–48.0)
pH, Arterial: 7.317 — ABNORMAL LOW (ref 7.350–7.450)
pH, Arterial: 7.354 (ref 7.350–7.450)
pH, Arterial: 7.373 (ref 7.350–7.450)
pO2, Arterial: 83 mmHg (ref 83.0–108.0)
pO2, Arterial: 92 mmHg (ref 83.0–108.0)
pO2, Arterial: 95 mmHg (ref 83.0–108.0)

## 2021-09-21 LAB — BASIC METABOLIC PANEL
Anion gap: 8 (ref 5–15)
Anion gap: 8 (ref 5–15)
BUN: 9 mg/dL (ref 8–23)
BUN: 10 mg/dL (ref 8–23)
CO2: 23 mmol/L (ref 22–32)
CO2: 26 mmol/L (ref 22–32)
Calcium: 7.8 mg/dL — ABNORMAL LOW (ref 8.9–10.3)
Calcium: 8.1 mg/dL — ABNORMAL LOW (ref 8.9–10.3)
Chloride: 106 mmol/L (ref 98–111)
Chloride: 101 mmol/L (ref 98–111)
Creatinine, Ser: 0.73 mg/dL (ref 0.61–1.24)
Creatinine, Ser: 0.83 mg/dL (ref 0.61–1.24)
GFR, Estimated: >60 mL/min (ref >60)
GFR, Estimated: >60 mL/min (ref >60)
Glucose, Bld: 111 mg/dL — ABNORMAL HIGH (ref 70–99)
Glucose, Bld: 177 mg/dL — ABNORMAL HIGH (ref 70–99)
Potassium: 4.2 mmol/L (ref 3.5–5.1)
Potassium: 4.3 mmol/L (ref 3.5–5.1)
Sodium: 137 mmol/L (ref 135–145)
Sodium: 135 mmol/L (ref 135–145)

## 2021-09-21 LAB — CBC
HCT: 34.9 % — ABNORMAL LOW (ref 39.0–52.0)
HCT: 36 % — ABNORMAL LOW (ref 39.0–52.0)
Hemoglobin: 11.6 g/dL — ABNORMAL LOW (ref 13.0–17.0)
Hemoglobin: 12.3 g/dL — ABNORMAL LOW (ref 13.0–17.0)
MCH: 31.5 pg (ref 26.0–34.0)
MCH: 31.8 pg (ref 26.0–34.0)
MCHC: 33.2 g/dL (ref 30.0–36.0)
MCHC: 34.2 g/dL (ref 30.0–36.0)
MCV: 93 fL (ref 80.0–100.0)
MCV: 94.8 fL (ref 80.0–100.0)
Platelets: 121 10*3/uL — ABNORMAL LOW (ref 150–400)
Platelets: 142 10*3/uL — ABNORMAL LOW (ref 150–400)
RBC: 3.68 MIL/uL — ABNORMAL LOW (ref 4.22–5.81)
RBC: 3.87 MIL/uL — ABNORMAL LOW (ref 4.22–5.81)
RDW: 13.2 % (ref 11.5–15.5)
RDW: 13.2 % (ref 11.5–15.5)
WBC: 15.5 10*3/uL — ABNORMAL HIGH (ref 4.0–10.5)
WBC: 17.8 10*3/uL — ABNORMAL HIGH (ref 4.0–10.5)
nRBC: 0 % (ref 0.0–0.2)
nRBC: 0 % (ref 0.0–0.2)

## 2021-09-21 LAB — GLUCOSE, CAPILLARY
Glucose-Capillary: 109 mg/dL — ABNORMAL HIGH (ref 70–99)
Glucose-Capillary: 110 mg/dL — ABNORMAL HIGH (ref 70–99)
Glucose-Capillary: 110 mg/dL — ABNORMAL HIGH (ref 70–99)
Glucose-Capillary: 113 mg/dL — ABNORMAL HIGH (ref 70–99)
Glucose-Capillary: 124 mg/dL — ABNORMAL HIGH (ref 70–99)
Glucose-Capillary: 127 mg/dL — ABNORMAL HIGH (ref 70–99)
Glucose-Capillary: 147 mg/dL — ABNORMAL HIGH (ref 70–99)
Glucose-Capillary: 149 mg/dL — ABNORMAL HIGH (ref 70–99)
Glucose-Capillary: 152 mg/dL — ABNORMAL HIGH (ref 70–99)
Glucose-Capillary: 158 mg/dL — ABNORMAL HIGH (ref 70–99)
Glucose-Capillary: 98 mg/dL (ref 70–99)
Glucose-Capillary: 98 mg/dL (ref 70–99)

## 2021-09-21 LAB — MAGNESIUM
Magnesium: 2.7 mg/dL — ABNORMAL HIGH (ref 1.7–2.4)
Magnesium: 2.1 mg/dL (ref 1.7–2.4)

## 2021-09-21 MED ORDER — INSULIN ASPART 100 UNIT/ML IJ SOLN
0.0000 [IU] | INTRAMUSCULAR | Status: DC
  Administered 2021-09-21 – 2021-09-22 (×7): 2 [IU] via SUBCUTANEOUS

## 2021-09-21 MED ORDER — POTASSIUM CHLORIDE CRYS ER 20 MEQ PO TBCR
40.0000 meq | EXTENDED_RELEASE_TABLET | Freq: Once | ORAL | Status: AC
  Administered 2021-09-21: 40 meq via ORAL
  Filled 2021-09-21: qty 2

## 2021-09-21 MED ORDER — ORAL CARE MOUTH RINSE
15.0000 mL | OROMUCOSAL | Status: DC
  Administered 2021-09-21 (×3): 15 mL via OROMUCOSAL

## 2021-09-21 MED ORDER — ENOXAPARIN SODIUM 40 MG/0.4ML IJ SOSY
40.0000 mg | PREFILLED_SYRINGE | Freq: Every day | INTRAMUSCULAR | Status: DC
  Administered 2021-09-21 – 2021-09-22 (×2): 40 mg via SUBCUTANEOUS
  Filled 2021-09-21 (×2): qty 0.4

## 2021-09-21 MED ORDER — INSULIN ASPART 100 UNIT/ML IJ SOLN: 0.0000 [IU] | INTRAMUSCULAR | Status: DC

## 2021-09-21 MED ORDER — INSULIN DETEMIR 100 UNIT/ML ~~LOC~~ SOLN: 6.0000 [IU] | Freq: Every day | SUBCUTANEOUS | Status: DC

## 2021-09-21 MED ORDER — INSULIN DETEMIR 100 UNIT/ML ~~LOC~~ SOLN
6.0000 [IU] | Freq: Every day | SUBCUTANEOUS | Status: DC
  Administered 2021-09-21 – 2021-09-23 (×3): 6 [IU] via SUBCUTANEOUS
  Filled 2021-09-21 (×5): qty 0.06

## 2021-09-21 MED ORDER — FUROSEMIDE 10 MG/ML IJ SOLN
20.0000 mg | Freq: Once | INTRAMUSCULAR | Status: AC
  Administered 2021-09-21: 20 mg via INTRAVENOUS
  Filled 2021-09-21: qty 2

## 2021-09-21 MED ORDER — HYDROCODONE-ACETAMINOPHEN 5-325 MG PO TABS
1.0000 | ORAL_TABLET | ORAL | Status: DC | PRN
  Administered 2021-09-21 – 2021-09-24 (×12): 2 via ORAL
  Filled 2021-09-21 (×12): qty 2

## 2021-09-21 MED ORDER — FUROSEMIDE 10 MG/ML IJ SOLN
40.0000 mg | Freq: Once | INTRAMUSCULAR | Status: AC
  Administered 2021-09-21: 40 mg via INTRAVENOUS
  Filled 2021-09-21: qty 4

## 2021-09-21 MED FILL — Potassium Chloride Inj 2 mEq/ML: INTRAVENOUS | Qty: 40 | Status: AC

## 2021-09-21 MED FILL — Heparin Sodium (Porcine) Inj 1000 Unit/ML: INTRAMUSCULAR | Qty: 5000 | Status: AC

## 2021-09-21 MED FILL — Thrombin (Recombinant) For Soln 20000 Unit: CUTANEOUS | Qty: 1 | Status: AC

## 2021-09-21 MED FILL — Lidocaine HCl Local Preservative Free (PF) Inj 2%: INTRAMUSCULAR | Qty: 15 | Status: AC

## 2021-09-21 MED FILL — Heparin Sodium (Porcine) Inj 1000 Unit/ML: Qty: 1000 | Status: AC

## 2021-09-21 NOTE — Plan of Care (Signed)
  Problem: Cardiac: Goal: Will achieve and/or maintain hemodynamic stability Outcome: Progressing   Problem: Respiratory: Goal: Respiratory status will improve Outcome: Progressing   Problem: Urinary Elimination: Goal: Ability to achieve and maintain adequate renal perfusion and functioning will improve Outcome: Progressing

## 2021-09-21 NOTE — Discharge Instructions (Signed)

## 2021-09-21 NOTE — Progress Notes (Addendum)
[facility] DAILY ICU PROGRESS NOTE                   [address]            [place] [address]          [phone]   1 Day Post-Op Procedure(s) (LRB): AORTIC VALVE REPLACEMENT (AVR) USING 25 MM INSPIRIS RESILIA  AORTIC VALVE (N/A) CLIPPING OF ATRIAL APPENDAGE USING ATRICURE 35 MM ATRICLIP (Left) TRANSESOPHAGEAL ECHOCARDIOGRAM (TEE) (N/A) APPLICATION OF CELL SAVER (N/A)  Total Length of Stay:  LOS: 1 day   Subjective: Patient awake this am. He had nausea last evening but denies this am.  Objective: Vital signs in last 24 hours: Temp:  [97 F (36.1 C)-99.7 F (37.6 C)] 99.7 F (37.6 C) (11/08 0500) Pulse Rate:  [63-87] 84 (11/08 0500) Cardiac Rhythm: Atrial paced (11/08 0000) Resp:  [9-20] 17 (11/08 0500) BP: (98-165)/(64-85) 106/70 (11/08 0500) SpO2:  [95 %-100 %] 99 % (11/08 0500) Arterial Line BP: (94-146)/(44-72) 118/50 (11/08 0500) FiO2 (%):  [40 %-50 %] 40 % (11/07 2113) Weight:  [104.8 kg] 104.8 kg (11/07 0936)  Filed Weights   09/20/21 0936  Weight: 104.8 kg    Weight change:    Hemodynamic parameters for last 24 hours: PAP: (21-39)/(10-21) 25/13 CO:  [3.3 L/min-3.6 L/min] 3.3 L/min CI:  [1.5 L/min/m2-1.6 L/min/m2] 1.5 L/min/m2  Intake/Output from previous day: 11/07 0701 - 11/08 0700 In: 5007.8 [I.V.:3275.6; Blood:480; IV Piggyback:1252.2] Out: 4270 [Urine:3050; Blood:900; Chest Tube:320]  Intake/Output this shift: Total I/O In: 940.4 [I.V.:384; IV Piggyback:556.4] Out: 1365 [Urine:1075; Chest Tube:290]  Current Meds: Scheduled Meds:  acetaminophen  1,000 mg Oral Q6H   Or   acetaminophen (TYLENOL) oral liquid 160 mg/5 mL  1,000 mg Per Tube Q6H   aspirin EC  325 mg Oral Daily   Or   aspirin  324 mg Per Tube Daily   atorvastatin  40 mg Oral QHS   bisacodyl  10 mg Oral Daily   Or   bisacodyl  10 mg Rectal Daily   buPROPion  150 mg Oral q AM   chlorhexidine gluconate (MEDLINE KIT)  15 mL Mouth Rinse BID   Chlorhexidine Gluconate  Cloth  6 each Topical Daily   docusate sodium  200 mg Oral Daily   fluticasone  2 spray Each Nare Daily   loratadine  10 mg Oral q AM   mouth rinse  15 mL Mouth Rinse Q4H   metoCLOPramide (REGLAN) injection  10 mg Intravenous Q6H   metoprolol tartrate  12.5 mg Oral BID   Or   metoprolol tartrate  12.5 mg Per Tube BID   omega-3 acid ethyl esters  1 g Oral Daily   pantoprazole  40 mg Oral Daily   sertraline  50 mg Oral Daily   sodium chloride flush  3 mL Intravenous Q12H   tamsulosin  0.4 mg Oral QHS   Continuous Infusions:  sodium chloride     sodium chloride     sodium chloride     albumin human 12.5 g (09/20/21 1904)    ceFAZolin (ANCEF) IV 200 mL/hr at 09/21/21 0600   dexmedetomidine (PRECEDEX) IV infusion 0.2 mcg/kg/hr (09/21/21 0600)   insulin 1.9 Units/hr (09/21/21 0600)   lactated ringers     lactated ringers 10 mL/hr at 09/21/21 0602   lactated ringers     nitroGLYCERIN Stopped (09/21/21 0322)   phenylephrine (NEO-SYNEPHRINE) Adult infusion Stopped (09/20/21 1840)   PRN Meds:.sodium chloride, albumin human,   albuterol, dextrose, HYDROcodone-acetaminophen, lactated ringers, LORazepam, metoprolol tartrate, morphine injection, ondansetron (ZOFRAN) IV, sodium chloride flush, traMADol, zolpidem  General appearance: alert, cooperative, and no distress Neurologic: intact Heart: RRR (paced), no murmur Lungs: Diminished bibasilar breath sounds Abdomen: Soft, obese, non tender, bowel sounds Extremities: SCDs in place Wound: Sternal dressing is clean and dry  Lab Results: CBC: Recent Labs    09/20/21 1800 09/20/21 1811 09/20/21 2359 09/21/21 0012  WBC 12.1*  --   --  17.8*  HGB 11.3*   < > 12.2* 12.3*  HCT 32.9*   < > 36.0* 36.0*  PLT 110*  --   --  142*   < > = values in this interval not displayed.   BMET:  Recent Labs    09/20/21 1640 09/20/21 1811 09/20/21 2359 09/21/21 0012  NA 138   < > 140 137  K 4.1   < > 4.3 4.2  CL 100  --   --  106  CO2  --   --    --  23  GLUCOSE 123*  --   --  111*  BUN 10  --   --  9  CREATININE 0.60*  --   --  0.73  CALCIUM  --   --   --  7.8*   < > = values in this interval not displayed.    CMET: Lab Results  Component Value Date   WBC 17.8 (H) 09/21/2021   HGB 12.3 (L) 09/21/2021   HCT 36.0 (L) 09/21/2021   PLT 142 (L) 09/21/2021   GLUCOSE 111 (H) 09/21/2021   CHOL 137 04/01/2021   TRIG 166 (H) 04/01/2021   HDL 43 04/01/2021   LDLCALC 66 04/01/2021   ALT 38 09/16/2021   AST 34 09/16/2021   NA 137 09/21/2021   K 4.2 09/21/2021   CL 106 09/21/2021   CREATININE 0.73 09/21/2021   BUN 9 09/21/2021   CO2 23 09/21/2021   TSH 2.280 07/27/2021   PSA 3.3 08/16/2016   INR 1.4 (H) 09/20/2021   HGBA1C 6.8 (H) 09/16/2021   MICROALBUR 55.5 08/03/2021      PT/INR:  Recent Labs    09/20/21 1800  LABPROT 16.8*  INR 1.4*   Radiology: DG Chest Port 1 View  Result Date: 09/20/2021 CLINICAL DATA:  Aortic valve replacement EXAM: PORTABLE CHEST 1 VIEW COMPARISON:  09/16/2021 FINDINGS: Interval postoperative changes to the chest status post aortic valve replacement. ET tube terminates approximately 4.2 cm above the carina. Enteric tube terminates within the proximal stomach. Right IJ Swan-Ganz catheter terminates within the proximal pulmonary outflow. Right basilar chest tube and mediastinal drain in place. Prosthetic aortic valve. Left atrial appendage clip. Median sternotomy wires. Heart size is normal. Slightly low lung volumes. Minimal left perihilar atelectasis. No pleural effusion or pneumothorax. IMPRESSION: 1. Interval postoperative changes to the chest status post aortic valve replacement. No acute findings. 2. Lines and tubes, as above. Electronically Signed   By: Nicholas  Plundo D.O.   On: 09/20/2021 18:58     Assessment/Plan: S/P Procedure(s) (LRB): AORTIC VALVE REPLACEMENT (AVR) USING 25 MM INSPIRIS RESILIA  AORTIC VALVE (N/A) CLIPPING OF ATRIAL APPENDAGE USING ATRICURE 35 MM ATRICLIP  (Left) TRANSESOPHAGEAL ECHOCARDIOGRAM (TEE) (N/A) APPLICATION OF CELL SAVER (N/A) CV-CO/CI 3.3/1.5. A paced this am at 80 . On Lopressor 12.5 mg bid. Pulmonary-on 4 liters of oxygen via [place]. Will wean as able over next few days. Chest tubes with 320 cc since surgery. CXR this am appears to show   cardiomegaly, bibasilar atelectasis. Remove chest tube  Encourage incentive spirometer. Volume overload-gently diurese Expected post op blood loss anemia-H and H this am stable at 12.3 and 36. 5. DM-CBGs 110/113/124. Pre op HGA1C 6.8. Will restart Metformin closer or at discharge. 6. Regarding pain control, required a fair amount of Morphine and some Vicodin last evening. Will discuss with Dr. Bartle, if Toradol possible. Hopefully, removal of chest tubes helps pain 7. Remove a line, Swan Ganz, chest tubes   Donielle M Zimmerman PA-C 09/21/2021 6:56 AM    Chart reviewed, patient examined, agree with above. He has had a good day. Ambulated. Diuresing. Tubes out. He has had some incisional pain but managing it. Doing 1250 on IS. 

## 2021-09-21 NOTE — Progress Notes (Signed)
      [address]       [place] [address]             [phone]      POD # 1 AVR, LAA clip  BP (!) 155/120   Pulse 96   Temp 98.5 F (36.9 C) (Oral)   Resp 16   Ht 6' (1.829 m)   Wt 112.9 kg   SpO2 94%   BMI 33.76 kg/m  2L [place] 98% sat  Intake/Output Summary (Last 24 hours) at 09/21/2021 1830 Last data filed at 09/21/2021 1800 Gross per 24 hour  Intake 2042.74 ml  Output 3530 ml  Net -1487.26 ml   Creatinine 0.8, K= 4.3 Hct 35  Doing well POD # 1  Maritza Goldsborough C. Roxan Hockey, MD [facility] [phone]

## 2021-09-21 NOTE — Discharge Summary (Signed)
[address]       [place] [address]             [phone]    Physician Discharge Summary  Patient ID: Jeremy Tucker MRN: [id number] DOB/AGE: November 29, 1950 70 y.o.  Admit date: 09/20/2021 Discharge date: 09/24/2021  Admission Diagnoses:  Patient Active Problem List   Diagnosis Date Noted   Ureteric stone 12/14/2020   Malignant neoplasm of prostate ([place]) 01/31/2020   Chronic pansinusitis 12/06/2019   Deviated septum 12/06/2019   Nasal obstruction 12/06/2019   Nasal polyposis 12/06/2019   Diabetic peripheral neuropathy ([place]) 01/07/2019   Right carpal tunnel syndrome 01/07/2019   Peroneal neuropathy at knee, right 12/05/2018   History of cataract extraction 08/21/2018   History of adenomatous polyp of colon 07/04/2018   Hyperlipidemia 07/04/2018   S/P right TKA 10/30/2017   Chronic anticoagulation 08/31/2017   Insomnia 03/16/2017   History of renal stone 06/10/2015   Hypertension associated with diabetes ([place]) 05/25/2015   Paroxysmal atrial fibrillation ([facility])    Severe aortic stenosis    OSA (obstructive sleep apnea) 03/09/2015   GERD (gastroesophageal reflux disease)    BPH (benign prostatic hyperplasia)    Diastolic dysfunction-grade 15 Sep 2014 01/21/2015   Hyperlipidemia associated with type 2 diabetes mellitus ([place]) 12/05/2011   Controlled type 2 diabetes mellitus with complication, without long-term current use of insulin ([place]) 12/05/2011   Obesity (BMI 30-39.9) 12/05/2011   Asthma 12/05/2011   Arthritis 12/05/2011   ED (erectile dysfunction) 12/05/2011   Discharge Diagnoses:  Patient Active Problem List   Diagnosis Date Noted   S/P AVR 09/20/2021   Ureteric stone 12/14/2020   Malignant neoplasm of prostate ([name]) 01/31/2020   Chronic pansinusitis 12/06/2019   Deviated septum 12/06/2019   Nasal obstruction 12/06/2019   Nasal polyposis 12/06/2019   Diabetic peripheral neuropathy ([place]) 01/07/2019   Right carpal tunnel syndrome 01/07/2019    Peroneal neuropathy at knee, right 12/05/2018   History of cataract extraction 08/21/2018   History of adenomatous polyp of colon 07/04/2018   Hyperlipidemia 07/04/2018   S/P right TKA 10/30/2017   Chronic anticoagulation 08/31/2017   Insomnia 03/16/2017   History of renal stone 06/10/2015   Hypertension associated with diabetes ([place]) 05/25/2015   Paroxysmal atrial fibrillation (H[facility])    Severe aortic stenosis    OSA (obstructive sleep apnea) 03/09/2015   GERD (gastroesophageal reflux disease)    BPH (benign prostatic hyperplasia)    Diastolic dysfunction-grade 15 Sep 2014 01/21/2015   Hyperlipidemia associated with type 2 diabetes mellitus ([place]) 12/05/2011   Controlled type 2 diabetes mellitus with complication, without long-term current use of insulin ([place]) 12/05/2011   Obesity (BMI 30-39.9) 12/05/2011   Asthma 12/05/2011   Arthritis 12/05/2011   ED (erectile dysfunction) 12/05/2011   Discharged Condition: good  History of Present Illness:  The patient is a 70 year old gentleman with a history of hypertension, hyperlipidemia, diabetes with peripheral neuropathy, paroxysmal atrial fibrillation status post ablation on Eliquis, OSA intolerant of CPAP, prostate cancer status post radioactive seed implant in April 2022, and aortic stenosis.  He has been followed by Dr. Tamala Julian and an echo in March 2022 showed an increase in the mean gradient to 31.6 mmHg with a peak gradient of 56 mmHg.  Valve area was 1.2 cm.  His most recent echo on 07/20/2021 showed further progression to severe aortic stenosis with a mean gradient of 44 mmHg and a peak gradient of 82 mmHg.  Dimensionless index is 0.2  with a valve area of 0.98 cm.  Left ventricular ejection fraction is 60 to 65% with moderate asymmetric LVH of the basal-septal segment with grade 1 diastolic dysfunction.  There is mild mitral valve regurgitation.  He subsequently underwent cardiac catheterization on 08/04/2021 showing mild nonobstructive  coronary disease with normal right heart pressures.  He was evaluated by Dr. Cyndia Bent at which time the patient noted  progressive exertional fatigue and shortness of breath over the past year.  He used to walk every day but tires easily now.  He has some episodes of lightheadedness with bending over.  He denies any chest pressure or pain.  He has had mild peripheral edema.  The patient was felt to require aortic valve replacement.  The risks and benefits of the procedure were explained to the patient and he was agreeable to proceed.  Hospital Course:  Mr. Lowella Bandy presented to [facility] on 09/20/2021.  He underwent Aortic Valve Replacement with a 25 mm Edwards Resilia Bioprosthetic valve and clipping of his LA appendage with a 35 mm Atricure Pro2 Clip.  He tolerated the procedure without difficulty and was taken to the SICU in stable condition. He was extubated late the evening of surgery without difficulty. He was initially A paced at 80. [name], a line, and chest tubes were removed on post op day one. Foley was then removed on post op day 2. He was started on Lopressor. He was volume overloaded and diuresed accordingly. He was weaned off the Insulin drip. His pre op HGA1C 6.8. He will be restarted on Metformin closer to or at discharge. He was felt surgically stable for transfer from the ICU to 4E for further convalescence on 11/09. He was weaned off oxygen and ambulating with good oxygenation on room air. He did have post op pain. He has an allergy to Oxy but tolerated Norco. He has been tolerating a diet and has had a bowel movement. His wound is clean, dry, and healing without sign of infection. Epicardial pacing wires were removed on 11/10.  He was hypertensive and started on low dose Cozaar.  He continues to maintain NSR.  He will be started on home Eliquis at time of discharge.  He is ambulating without difficulty.  His surgical incisions are healing without evidence of infection.  He is  medically stable for discharge home today.  Consults: None  Significant Diagnostic Studies: cardiac graphics: Echocardiogram:    IMPRESSIONS    1. Left ventricular ejection fraction, by estimation, is 60 to 65%. The  left ventricle has normal function. The left ventricle has no regional  wall motion abnormalities. There is moderate asymmetric left ventricular  hypertrophy of the basal-septal  segment. Left ventricular diastolic parameters are consistent with Grade I  diastolic dysfunction (impaired relaxation).   2. Right ventricular systolic function is normal. The right ventricular  size is normal. Tricuspid regurgitation signal is inadequate for assessing  PA pressure.   3. Left atrial size was moderately dilated.   4. The mitral valve is grossly normal. Mild mitral valve regurgitation.   5. The aortic valve is tricuspid. There is moderate calcification of the  aortic valve. Aortic valve regurgitation is mild. Severe aortic valve  stenosis. Aortic valve area, by VTI measures 0.98 cm. Aortic valve mean  gradient measures 44.0 mmHg. Aortic  valve Vmax measures 4.54 m/s. Peak gradient of 82 mmHg. DI is 0.20.   6. The inferior vena cava is normal in size with <50% respiratory  variability, suggesting  right atrial pressure of 8 mmHg.   Comparison(s): Changes from prior study are noted. 01/13/2021: LVEF 60-65%,  moderate to severe AS - mean gradient 37 mmHg.   Treatments: surgery:   09/20/2021 [place] [id number]   Surgeon:  [name], MD   First Assistant: Ellwood Handler,  PA-C:  An experienced assistant was required given the complexity of this surgery and the standard of surgical care. The assistant was needed for exposure, dissection, suctioning, retraction of delicate tissues and sutures, instrument exchange and for overall help during this procedure.     Preoperative Diagnosis:  Severe aortic stenosis    Postoperative Diagnosis:  Same    Procedure:   Median  Sternotomy Extracorporeal circulation 3.   Aortic valve replacement using a 25 mm Edwards INSPIRIS RESILIA pericardial valve. 4.   Clipping of left atrial appendage.  Discharge Exam: Blood pressure 124/78, pulse 75, temperature 98.1 F (36.7 C), temperature source Oral, resp. rate 12, height 6' (1.829 m), weight 110 kg, SpO2 96 %.  General appearance: alert, cooperative, and no distress Heart: regular rate and rhythm Lungs: clear to auscultation bilaterally Abdomen: soft, non-tender; bowel sounds normal; no masses,  no organomegaly Extremities: extremities normal, atraumatic, no cyanosis or edema Wound: clean and dry   Discharge Medications:  The patient has been discharged on:   1.Beta Blocker:  Yes [ X  ]                              No   [   ]                              If No, reason:  2.Ace Inhibitor/ARB: Yes [  X ]                                     No  [    ]                                     If No, reason:  3.Statin:   Yes [  X ]                  No  [   ]                  If No, reason:  4.Ecasa:  Yes  [ X  ]                  No   [   ]                  If No, reason:  Patient had ACS upon admission:  Plavix/P2Y12 inhibitor: Yes [   ]                                      No  [ X  ]     Discharge Instructions     Amb Referral to Cardiac Rehabilitation   Complete by: As directed    Diagnosis: Valve Replacement   Valve: Aortic   After initial evaluation and assessments completed: Virtual Based Care  may be provided alone or in conjunction with Phase 2 Cardiac Rehab based on patient barriers.: Yes      Allergies as of 09/24/2021       Reactions   Oxycodone Itching        Medication List     STOP taking these medications    diltiazem 180 MG 24 hr capsule Commonly known as: TIAZAC       TAKE these medications    acetaminophen 500 MG tablet Commonly known as: TYLENOL Take 1,000 mg by mouth every 8 (eight) hours as needed for mild  pain.   albuterol 108 (90 Base) MCG/ACT inhaler Commonly known as: VENTOLIN HFA INHALE 2 PUFFS INTO THE LUNGS EVERY 6 HOURS AS NEEDED FOR WHEEZING OR SHORTNESS OF BREATH   allopurinol 300 MG tablet Commonly known as: ZYLOPRIM Take 1 tablet (300 mg total) by mouth daily.   ammonium lactate 12 % lotion Commonly known as: LAC-HYDRIN Apply 1 application topically daily as needed for dry skin.   aspirin EC 81 MG tablet Take 1 tablet (81 mg total) by mouth daily. Swallow whole.   atorvastatin 40 MG tablet Commonly known as: LIPITOR Take 1 tablet (40 mg total) by mouth at bedtime.   buPROPion 300 MG 24 hr tablet Commonly known as: WELLBUTRIN XL Take 1 tablet (300 mg total) by mouth daily. What changed:  how much to take when to take this   Eliquis 5 MG Tabs tablet Generic drug: apixaban TAKE ONE TABLET BY MOUTH TWICE A DAY   Fish Oil 1000 MG Caps Take 1,000 mg by mouth daily.   fluticasone 50 MCG/ACT nasal spray Commonly known as: FLONASE Place 2 sprays into both nostrils daily.   furosemide 40 MG tablet Commonly known as: LASIX Take 1 tablet (40 mg total) by mouth daily.   HYDROcodone-acetaminophen 5-325 MG tablet Commonly known as: NORCO/VICODIN Take 1 tablet by mouth every 4 (four) hours as needed for moderate pain. What changed: when to take this   lisinopril 5 MG tablet Commonly known as: ZESTRIL Take 1 tablet (5 mg total) by mouth daily.   loratadine 10 MG tablet Commonly known as: CLARITIN Take 10 mg by mouth in the morning.   LORazepam 0.5 MG tablet Commonly known as: ATIVAN TAKE 1 TABLET TWICE DAILY AS NEEDED FOR ANXIETY.   metFORMIN 500 MG tablet Commonly known as: GLUCOPHAGE Take 1 tablet (500 mg total) by mouth 2 (two) times daily with a meal.   metoprolol tartrate 25 MG tablet Commonly known as: LOPRESSOR Take 1 tablet (25 mg total) by mouth 2 (two) times daily.   multivitamin with minerals tablet Take 1 tablet by mouth daily.   olopatadine  0.1 % ophthalmic solution Commonly known as: PATANOL Place 1 drop into both eyes daily as needed for allergies (tearing).   omeprazole 20 MG capsule Commonly known as: PRILOSEC Take 20 mg by mouth daily.   potassium chloride SA 20 MEQ tablet Commonly known as: KLOR-CON Take 1 tablet (20 mEq total) by mouth daily.   sertraline 50 MG tablet Commonly known as: ZOLOFT Take 1 tablet (50 mg total) by mouth daily.   sildenafil 100 MG tablet Commonly known as: VIAGRA Take 1 tablet (100 mg total) by mouth daily as needed for erectile dysfunction.   tamsulosin 0.4 MG Caps capsule Commonly known as: FLOMAX Take 0.4 mg by mouth at bedtime.   zolpidem 5 MG tablet Commonly known as: AMBIEN Take 1 tablet (5 mg total) by mouth at bedtime as  needed for sleep.        Follow-up Information     [name], MD. Go to.   Specialty: Cardiothoracic Surgery Why: PA/LAT CXR to be taken (at [facility] which is in the same building as Dr. Vivi Martens office) on at;Appointment time is at Contact information: [place] [address] [phone]         [facility]. Go on 11/02/2021.   Specialty: Cardiology Why: Appointment time is at 9:15 am Contact information: [address] [id number] [address] [place]        Imogene Burn, [place]. Go on 10/13/2021.   Specialty: Cardiology Why: Appointment time is at 8:45 am [facility]) Contact information: [address] [place] [address] [phone]                 Signed:  Ellwood Handler, PA-C 09/24/2021, 7:45 AM

## 2021-09-22 ENCOUNTER — Inpatient Hospital Stay (HOSPITAL_COMMUNITY): Payer: PPO

## 2021-09-22 ENCOUNTER — Other Ambulatory Visit: Payer: Self-pay | Admitting: Physician Assistant

## 2021-09-22 DIAGNOSIS — I350 Nonrheumatic aortic (valve) stenosis: Secondary | ICD-10-CM

## 2021-09-22 LAB — GLUCOSE, CAPILLARY
Glucose-Capillary: 139 mg/dL — ABNORMAL HIGH (ref 70–99)
Glucose-Capillary: 209 mg/dL — ABNORMAL HIGH (ref 70–99)
Glucose-Capillary: 134 mg/dL — ABNORMAL HIGH (ref 70–99)
Glucose-Capillary: 146 mg/dL — ABNORMAL HIGH (ref 70–99)
Glucose-Capillary: 166 mg/dL — ABNORMAL HIGH (ref 70–99)

## 2021-09-22 LAB — CBC
HCT: 35.4 % — ABNORMAL LOW (ref 39.0–52.0)
Hemoglobin: 11.8 g/dL — ABNORMAL LOW (ref 13.0–17.0)
MCH: 31.6 pg (ref 26.0–34.0)
MCHC: 33.3 g/dL (ref 30.0–36.0)
MCV: 94.7 fL (ref 80.0–100.0)
Platelets: 121 10*3/uL — ABNORMAL LOW (ref 150–400)
RBC: 3.74 MIL/uL — ABNORMAL LOW (ref 4.22–5.81)
RDW: 13.2 % (ref 11.5–15.5)
WBC: 15.6 10*3/uL — ABNORMAL HIGH (ref 4.0–10.5)
nRBC: 0 % (ref 0.0–0.2)

## 2021-09-22 LAB — BASIC METABOLIC PANEL
Anion gap: 10 (ref 5–15)
BUN: 9 mg/dL (ref 8–23)
CO2: 27 mmol/L (ref 22–32)
Calcium: 8.3 mg/dL — ABNORMAL LOW (ref 8.9–10.3)
Chloride: 96 mmol/L — ABNORMAL LOW (ref 98–111)
Creatinine, Ser: 0.71 mg/dL (ref 0.61–1.24)
GFR, Estimated: >60 mL/min (ref >60)
Glucose, Bld: 160 mg/dL — ABNORMAL HIGH (ref 70–99)
Potassium: 4 mmol/L (ref 3.5–5.1)
Sodium: 133 mmol/L — ABNORMAL LOW (ref 135–145)

## 2021-09-22 LAB — SURGICAL PATHOLOGY

## 2021-09-22 MED ORDER — FUROSEMIDE 40 MG PO TABS
40.0000 mg | ORAL_TABLET | Freq: Every day | ORAL | Status: DC
  Administered 2021-09-22 – 2021-09-24 (×3): 40 mg via ORAL
  Filled 2021-09-22 (×3): qty 1

## 2021-09-22 MED ORDER — METOPROLOL TARTRATE 25 MG PO TABS
25.0000 mg | ORAL_TABLET | Freq: Two times a day (BID) | ORAL | Status: DC
  Administered 2021-09-22 – 2021-09-24 (×5): 25 mg via ORAL
  Filled 2021-09-22 (×5): qty 1

## 2021-09-22 MED ORDER — DM-GUAIFENESIN ER 30-600 MG PO TB12
1.0000 | ORAL_TABLET | Freq: Two times a day (BID) | ORAL | Status: DC
  Administered 2021-09-22 – 2021-09-24 (×5): 1 via ORAL
  Filled 2021-09-22 (×5): qty 1

## 2021-09-22 MED ORDER — KETOROLAC TROMETHAMINE 15 MG/ML IJ SOLN
15.0000 mg | Freq: Four times a day (QID) | INTRAMUSCULAR | Status: DC | PRN
  Filled 2021-09-22: qty 1

## 2021-09-22 MED ORDER — METOPROLOL TARTRATE 25 MG/10 ML ORAL SUSPENSION: 25.0000 mg | Freq: Two times a day (BID) | ORAL | Status: DC

## 2021-09-22 MED ORDER — INSULIN ASPART 100 UNIT/ML IJ SOLN
0.0000 [IU] | Freq: Three times a day (TID) | INTRAMUSCULAR | Status: DC
  Administered 2021-09-23: 2 [IU] via SUBCUTANEOUS
  Administered 2021-09-23 (×2): 4 [IU] via SUBCUTANEOUS
  Administered 2021-09-23 – 2021-09-24 (×2): 2 [IU] via SUBCUTANEOUS

## 2021-09-22 MED ORDER — INSULIN ASPART 100 UNIT/ML IJ SOLN: 0.0000 [IU] | INTRAMUSCULAR | Status: DC

## 2021-09-22 MED ORDER — POTASSIUM CHLORIDE CRYS ER 20 MEQ PO TBCR
20.0000 meq | EXTENDED_RELEASE_TABLET | Freq: Every day | ORAL | Status: DC
Start: 2021-09-22 — End: 2021-09-24
  Administered 2021-09-22 – 2021-09-24 (×3): 20 meq via ORAL
  Filled 2021-09-22 (×3): qty 1

## 2021-09-22 MED ORDER — INSULIN ASPART 100 UNIT/ML IJ SOLN: 0.0000 [IU] | Freq: Three times a day (TID) | INTRAMUSCULAR | Status: DC

## 2021-09-22 MED FILL — Electrolyte-R (PH 7.4) Solution: INTRAVENOUS | Qty: 4000 | Status: AC

## 2021-09-22 MED FILL — Sodium Bicarbonate IV Soln 8.4%: INTRAVENOUS | Qty: 50 | Status: AC

## 2021-09-22 MED FILL — Sodium Chloride IV Soln 0.9%: INTRAVENOUS | Qty: 2000 | Status: AC

## 2021-09-22 MED FILL — Mannitol IV Soln 20%: INTRAVENOUS | Qty: 500 | Status: AC

## 2021-09-22 NOTE — Plan of Care (Signed)
  Problem: Health Behavior/Discharge Planning: Goal: Ability to manage health-related needs will improve Outcome: Progressing   Problem: Clinical Measurements: Goal: Will remain free from infection Outcome: Progressing   Problem: Activity: Goal: Risk for activity intolerance will decrease Outcome: Progressing   

## 2021-09-22 NOTE — Progress Notes (Signed)
CARDIAC REHAB PHASE I   PRE:  Rate/Rhythm: 86 SR    BP: sitting 136/82    SaO2: 91 RA  MODE:  Ambulation: 400 ft   POST:  Rate/Rhythm: 90 SR    BP: sitting 134/77     SaO2: 94 RA  Mod assist supine to EOB with verbal cues. Stood on second attempt with rocking. Ambulated with RW, steady, no c/o. VSS, to recliner. Only c/o is diaphoresis. Encouraged IS and another walk. [place], ACSM 09/22/2021 3:21 PM

## 2021-09-22 NOTE — Progress Notes (Signed)
Mobility Specialist: Progress Note   09/22/21 1739  Mobility  Activity Ambulated in hall  Level of Assistance Minimal assist, patient does 75% or more  Assistive Device Front wheel walker  Distance Ambulated (ft) 320 ft  Mobility Ambulated with assistance in hallway  Mobility Response Tolerated well  Mobility performed by Mobility specialist  $Mobility charge 1 Mobility   Post-Mobility: 90 HR  Pt required minA to sit EOB from supine and x2 attempts to stand. Pt c/o some chest soreness but otherwise no c/o. Pt back to bed after walk and assisted with ordering his dinner. Pt has call bell and phone at his side and pt's wife present in the room.   [facility] Cedra Villalon Mobility Specialist Mobility Specialist Phone: [phone]

## 2021-09-22 NOTE — Progress Notes (Signed)
Echo placed s/p AVR. Results to Dr. Cyndia Bent and Dr. Tamala Julian.

## 2021-09-22 NOTE — Progress Notes (Addendum)
[facility] DAILY ICU PROGRESS NOTE                   [address]            [place] [address]          [phone]   2 Days Post-Op Procedure(s) (LRB): AORTIC VALVE REPLACEMENT (AVR) USING 25 MM INSPIRIS RESILIA  AORTIC VALVE (N/A) CLIPPING OF ATRIAL APPENDAGE USING ATRICURE 35 MM ATRICLIP (Left) TRANSESOPHAGEAL ECHOCARDIOGRAM (TEE) (N/A) APPLICATION OF CELL SAVER (N/A)  Total Length of Stay:  LOS: 2 days   Subjective: Patient already walked this am. He is eating breakfast. He had loose stools yesterday  Objective: Vital signs in last 24 hours: Temp:  [97.9 F (36.6 C)-99.5 F (37.5 C)] 99.1 F (37.3 C) (11/09 0352) Pulse Rate:  [66-96] 82 (11/09 0400) Cardiac Rhythm: Normal sinus rhythm (11/08 1200) Resp:  [13-23] 15 (11/09 0400) BP: (111-171)/(71-120) 145/75 (11/09 0400) SpO2:  [94 %-100 %] 97 % (11/09 0400) Arterial Line BP: (77-165)/(58-72) 77/70 (11/08 1105) Weight:  [109 kg-112.9 kg] 109 kg (11/09 0300)  Filed Weights   09/20/21 0936 09/21/21 0700 09/22/21 0300  Weight: 104.8 kg 112.9 kg 109 kg    Weight change: 4.219 kg   Hemodynamic parameters for last 24 hours: PAP: (28-40)/(14-17) 37/17  Intake/Output from previous day: 11/08 0701 - 11/09 0700 In: 802.8 [P.O.:400; I.V.:202.9; IV Piggyback:199.9] Out: 0211 [Urine:3525; Stool:1; Chest Tube:120]  Intake/Output this shift: Total I/O In: 184.5 [I.V.:84.6; IV Piggyback:99.9] Out: 1051 [Urine:1050; Stool:1]  Current Meds: Scheduled Meds:  aspirin EC  325 mg Oral Daily   Or   aspirin  324 mg Per Tube Daily   atorvastatin  40 mg Oral QHS   bisacodyl  10 mg Oral Daily   Or   bisacodyl  10 mg Rectal Daily   buPROPion  150 mg Oral q AM   chlorhexidine gluconate (MEDLINE KIT)  15 mL Mouth Rinse BID   Chlorhexidine Gluconate Cloth  6 each Topical Daily   docusate sodium  200 mg Oral Daily   enoxaparin (LOVENOX) injection  40 mg Subcutaneous QHS   fluticasone  2 spray Each Nare Daily    insulin aspart  0-24 Units Subcutaneous Q4H   insulin detemir  6 Units Subcutaneous Daily   loratadine  10 mg Oral q AM   mouth rinse  15 mL Mouth Rinse Q4H   metoCLOPramide (REGLAN) injection  10 mg Intravenous Q6H   metoprolol tartrate  12.5 mg Oral BID   Or   metoprolol tartrate  12.5 mg Per Tube BID   omega-3 acid ethyl esters  1 g Oral Daily   pantoprazole  40 mg Oral Daily   sertraline  50 mg Oral Daily   sodium chloride flush  3 mL Intravenous Q12H   tamsulosin  0.4 mg Oral QHS   Continuous Infusions:  sodium chloride     sodium chloride     sodium chloride      ceFAZolin (ANCEF) IV Stopped (09/21/21 2204)   lactated ringers 10 mL/hr at 09/22/21 0400   lactated ringers     nitroGLYCERIN Stopped (09/21/21 0322)   PRN Meds:.sodium chloride, albuterol, dextrose, HYDROcodone-acetaminophen, LORazepam, metoprolol tartrate, morphine injection, ondansetron (ZOFRAN) IV, sodium chloride flush, traMADol, zolpidem  General appearance: alert, cooperative, and no distress Neurologic: intact Heart: RRR , no murmur Lungs: Diminished bibasilar breath sounds Abdomen: Soft, obese, non tender, bowel sounds Extremities: No LE edema Wound: Sternal dressing is clean and dry  Lab Results: CBC: Recent Labs    09/21/21 1707 09/22/21 0356  WBC 15.5* 15.6*  HGB 11.6* 11.8*  HCT 34.9* 35.4*  PLT 121* 121*    BMET:  Recent Labs    09/21/21 1707 09/22/21 0356  NA 135 133*  K 4.3 4.0  CL 101 96*  CO2 26 27  GLUCOSE 177* 160*  BUN 10 9  CREATININE 0.83 0.71  CALCIUM 8.1* 8.3*     CMET: Lab Results  Component Value Date   WBC 15.6 (H) 09/22/2021   HGB 11.8 (L) 09/22/2021   HCT 35.4 (L) 09/22/2021   PLT 121 (L) 09/22/2021   GLUCOSE 160 (H) 09/22/2021   CHOL 137 04/01/2021   TRIG 166 (H) 04/01/2021   HDL 43 04/01/2021   LDLCALC 66 04/01/2021   ALT 38 09/16/2021   AST 34 09/16/2021   NA 133 (L) 09/22/2021   K 4.0 09/22/2021   CL 96 (L) 09/22/2021   CREATININE 0.71  09/22/2021   BUN 9 09/22/2021   CO2 27 09/22/2021   TSH 2.280 07/27/2021   PSA 3.3 08/16/2016   INR 1.4 (H) 09/20/2021   HGBA1C 6.8 (H) 09/16/2021   MICROALBUR 55.5 08/03/2021      PT/INR:  Recent Labs    09/20/21 1800  LABPROT 16.8*  INR 1.4*    Radiology: No results found.   Assessment/Plan: S/P Procedure(s) (LRB): AORTIC VALVE REPLACEMENT (AVR) USING 25 MM INSPIRIS RESILIA  AORTIC VALVE (N/A) CLIPPING OF ATRIAL APPENDAGE USING ATRICURE 35 MM ATRICLIP (Left) TRANSESOPHAGEAL ECHOCARDIOGRAM (TEE) (N/A) APPLICATION OF CELL SAVER (N/A) CV-SR with HR in the 80's. On Lopressor 12.5 mg bid.Will increase Lopressor to 25 mg bid. Pulmonary-on 2 liters of oxygen via [place]. Will continue to wean as able over next few days. CXR this am appears to show cardiomegaly, bibasilar atelectasis. Encourage incentive spirometer. Volume overload-Give Lasix 40 mg daily Expected post op blood loss anemia-H and H this am stable at 11.8 and 35.4 5. DM-CBGs 149/152/139. Pre op HGA1C 6.8. On Insulin. Will restart Metformin closer or at discharge. 6. Stop stool softeners 7. Regarding pain control, continue Norco (allergic to Codeine) 8. Transfer to 4E for further convalescence    Donielle Liston Alba PA-C 09/22/2021 6:55 AM    Chart reviewed, patient examined, agree with above. He has a lot of pain today. Will use Toradol in addition to hydrocodone since creat normal. Remains in sinus rhythm on Lopressor. Plan to resume Eliquis at discharge for PAF.

## 2021-09-22 NOTE — Plan of Care (Signed)
  Problem: Respiratory: Goal: Respiratory status will improve Outcome: Progressing   Problem: Cardiac: Goal: Will achieve and/or maintain hemodynamic stability Outcome: Progressing   Problem: Urinary Elimination: Goal: Ability to achieve and maintain adequate renal perfusion and functioning will improve Outcome: Progressing   Problem: Activity: Goal: Risk for activity intolerance will decrease Outcome: Progressing   Problem: Clinical Measurements: Goal: Diagnostic test results will improve Outcome: Progressing

## 2021-09-22 NOTE — Progress Notes (Signed)
Report called to [facility]. Patient with no coplaints at the current time. Will transfer him out.

## 2021-09-23 ENCOUNTER — Inpatient Hospital Stay (HOSPITAL_COMMUNITY): Payer: PPO

## 2021-09-23 LAB — BASIC METABOLIC PANEL
Anion gap: 8 (ref 5–15)
BUN: 10 mg/dL (ref 8–23)
CO2: 30 mmol/L (ref 22–32)
Calcium: 8.7 mg/dL — ABNORMAL LOW (ref 8.9–10.3)
Chloride: 98 mmol/L (ref 98–111)
Creatinine, Ser: 0.67 mg/dL (ref 0.61–1.24)
GFR, Estimated: >60 mL/min (ref >60)
Glucose, Bld: 146 mg/dL — ABNORMAL HIGH (ref 70–99)
Potassium: 4 mmol/L (ref 3.5–5.1)
Sodium: 136 mmol/L (ref 135–145)

## 2021-09-23 LAB — CBC
HCT: 32.9 % — ABNORMAL LOW (ref 39.0–52.0)
Hemoglobin: 11.3 g/dL — ABNORMAL LOW (ref 13.0–17.0)
MCH: 32 pg (ref 26.0–34.0)
MCHC: 34.3 g/dL (ref 30.0–36.0)
MCV: 93.2 fL (ref 80.0–100.0)
Platelets: 120 10*3/uL — ABNORMAL LOW (ref 150–400)
RBC: 3.53 MIL/uL — ABNORMAL LOW (ref 4.22–5.81)
RDW: 12.8 % (ref 11.5–15.5)
WBC: 13.7 10*3/uL — ABNORMAL HIGH (ref 4.0–10.5)
nRBC: 0 % (ref 0.0–0.2)

## 2021-09-23 LAB — GLUCOSE, CAPILLARY
Glucose-Capillary: 160 mg/dL — ABNORMAL HIGH (ref 70–99)
Glucose-Capillary: 162 mg/dL — ABNORMAL HIGH (ref 70–99)
Glucose-Capillary: 143 mg/dL — ABNORMAL HIGH (ref 70–99)
Glucose-Capillary: 167 mg/dL — ABNORMAL HIGH (ref 70–99)

## 2021-09-23 MED ORDER — LOSARTAN POTASSIUM 25 MG PO TABS: 25.0000 mg | ORAL_TABLET | Freq: Every day | ORAL | Status: DC

## 2021-09-23 MED ORDER — APIXABAN 5 MG PO TABS: 5.0000 mg | ORAL_TABLET | Freq: Two times a day (BID) | ORAL | Status: DC

## 2021-09-23 MED ORDER — LISINOPRIL 5 MG PO TABS
5.0000 mg | ORAL_TABLET | Freq: Every day | ORAL | Status: DC
  Administered 2021-09-23 – 2021-09-24 (×2): 5 mg via ORAL
  Filled 2021-09-23 (×2): qty 1

## 2021-09-23 NOTE — Progress Notes (Signed)
Discussed IS, sternal precautions, DM diet, exercise, and CRPII. Pt and wife receptive.  Will refer to [facility].  [phone] Yves Dill CES, ACSM 10:32 AM 09/23/2021

## 2021-09-23 NOTE — Progress Notes (Addendum)
      [address]       [place] [address]             [phone]      3 Days Post-Op Procedure(s) (LRB): AORTIC VALVE REPLACEMENT (AVR) USING 25 MM INSPIRIS RESILIA  AORTIC VALVE (N/A) CLIPPING OF ATRIAL APPENDAGE USING ATRICURE 35 MM ATRICLIP (Left) TRANSESOPHAGEAL ECHOCARDIOGRAM (TEE) (N/A) APPLICATION OF CELL SAVER (N/A)  Subjective:  Overall doing well.  States he didn't sleep well last night.  + ambulation   + BM  Objective: Vital signs in last 24 hours: Temp:  [97.4 F (36.3 C)-98.4 F (36.9 C)] 98.4 F (36.9 C) (11/10 0715) Pulse Rate:  [0-90] 84 (11/10 0715) Cardiac Rhythm: Normal sinus rhythm;Bundle branch block (11/10 0701) Resp:  [15-21] 17 (11/10 0715) BP: (128-160)/(75-94) 152/85 (11/10 0715) SpO2:  [94 %-98 %] 97 % (11/10 0715) Weight:  [110.2 kg] 110.2 kg (11/10 0355)  Intake/Output from previous day: 11/09 0701 - 11/10 0700 In: 324.7 [P.O.:120; I.V.:4.7; IV Piggyback:200] Out: 850 [Urine:850]  General appearance: alert, cooperative, and no distress Heart: regular rate and rhythm Lungs: clear to auscultation bilaterally Abdomen: soft, non-tender; bowel sounds normal; no masses,  no organomegaly Extremities: extremities normal, atraumatic, no cyanosis or edema Wound: clean and dry  Lab Results: Recent Labs    09/22/21 0356 09/23/21 0053  WBC 15.6* 13.7*  HGB 11.8* 11.3*  HCT 35.4* 32.9*  PLT 121* 120*   BMET:  Recent Labs    09/22/21 0356 09/23/21 0053  NA 133* 136  K 4.0 4.0  CL 96* 98  CO2 27 30  GLUCOSE 160* 146*  BUN 9 10  CREATININE 0.71 0.67  CALCIUM 8.3* 8.7*    PT/INR:  Recent Labs    09/20/21 1800  LABPROT 16.8*  INR 1.4*   ABG    Component Value Date/Time   PHART 7.317 (L) 09/20/2021 2359   HCO3 25.5 09/20/2021 2359   TCO2 27 09/20/2021 2359   ACIDBASEDEF 1.0 09/20/2021 2359   O2SAT 96.0 09/20/2021 2359   CBG (last 3)  Recent Labs    09/22/21 1641 09/22/21 2014 09/23/21 0610  GLUCAP  146* 166* 160*    Assessment/Plan: S/P Procedure(s) (LRB): AORTIC VALVE REPLACEMENT (AVR) USING 25 MM INSPIRIS RESILIA  AORTIC VALVE (N/A) CLIPPING OF ATRIAL APPENDAGE USING ATRICURE 35 MM ATRICLIP (Left) TRANSESOPHAGEAL ECHOCARDIOGRAM (TEE) (N/A) APPLICATION OF CELL SAVER (N/A)  CV- NSR, + HTN- continue Lopressor 25 mg BID, will add low dose Cozaar, will d/c EPW today, restart Eliquis this evening Pulm- no acute issues, off oxygen, continue IS... CXR is free from pneumothorax, pleural effusion Renal- creatinine remains WNL at 0.67, weight is trending down, continue Lasix, potassium DM- sugars are controlled continue for now Dispo- patient stable, hypertensive will start low dose Cozaar, restart Eliquis tonight after EPW removal, continue Lasix, potassium, if remains stable tentative for discharge in the next 24-48 hours   LOS: 3 days   Ellwood Handler, PA-C 09/23/2021   Chart reviewed, patient examined, agree with above. Says he feels well today and ambulating without difficulty. Pain under control.  If he feels well tomorrow he can go home and resume Eliquis at discharge. Wt is still 12 lbs over preop. Continue lasix and KCL.

## 2021-09-23 NOTE — Progress Notes (Signed)
EPW's removed per MD order without difficulty.  Patient and wife educated on frequent BP's and bed rest for one hour.  Will continue to monitor.

## 2021-09-23 NOTE — Progress Notes (Signed)
Mobility Specialist: Progress Note   09/23/21 1103  Mobility  Activity Ambulated in hall  Level of Assistance Minimal assist, patient does 75% or more  Assistive Device Front wheel walker  Distance Ambulated (ft) 380 ft  Mobility Ambulated with assistance in hallway  Mobility Response Tolerated well  Mobility performed by Mobility specialist  Bed Position Chair  $Mobility charge 1 Mobility   Pre-Mobility: 77 HR Post-Mobility: 87 HR, 145/65 BP, 97% SpO2  Pt required minA to sit EOB from supine and was independent to stand. Pt c/o chest pain he rated 5/10 during ambulation. After returning to the room pt c/o feeling dizzy, VSS. Pt has call bell and phone at his side. Pt's wife present in the room.   [facility] Sharmayne Jablon Mobility Specialist Mobility Specialist Phone: [phone]

## 2021-09-23 NOTE — Progress Notes (Signed)
Came to ambulate however pt and wife watching the d/c video. Prefers to walk later.  Yves Dill CES, ACSM 2:51 PM 09/23/2021

## 2021-09-24 LAB — GLUCOSE, CAPILLARY: Glucose-Capillary: 155 mg/dL — ABNORMAL HIGH (ref 70–99)

## 2021-09-24 MED ORDER — HYDROCODONE-ACETAMINOPHEN 5-325 MG PO TABS: 1.0000 | ORAL_TABLET | ORAL | 0 refills | Status: DC | PRN

## 2021-09-24 MED ORDER — ASPIRIN EC 81 MG PO TBEC: 81.0000 mg | DELAYED_RELEASE_TABLET | Freq: Every day | ORAL | 2 refills | Status: AC

## 2021-09-24 MED ORDER — LISINOPRIL 5 MG PO TABS: 5.0000 mg | ORAL_TABLET | Freq: Every day | ORAL | 3 refills | Status: DC

## 2021-09-24 MED ORDER — POTASSIUM CHLORIDE CRYS ER 20 MEQ PO TBCR: 20.0000 meq | EXTENDED_RELEASE_TABLET | Freq: Every day | ORAL | 0 refills | Status: DC

## 2021-09-24 MED ORDER — METOPROLOL TARTRATE 25 MG PO TABS: 25.0000 mg | ORAL_TABLET | Freq: Two times a day (BID) | ORAL | 3 refills | Status: DC

## 2021-09-24 MED ORDER — FUROSEMIDE 40 MG PO TABS: 40.0000 mg | ORAL_TABLET | Freq: Every day | ORAL | 0 refills | Status: DC

## 2021-09-24 NOTE — Care Management Important Message (Signed)
Important Message  Patient Details  Name: SANKALP FERRELL MRN: [id number] Date of Birth: [birth year]   Medicare Important Message Given:  Yes     Shelda Altes 09/24/2021, 11:46 AM

## 2021-09-24 NOTE — Anesthesia Postprocedure Evaluation (Signed)
Anesthesia Post Note  Patient: Jeremy Tucker  Procedure(s) Performed: AORTIC VALVE REPLACEMENT (AVR) USING 25 MM INSPIRIS RESILIA  AORTIC VALVE (Chest) CLIPPING OF ATRIAL APPENDAGE USING ATRICURE 35 MM ATRICLIP (Left) TRANSESOPHAGEAL ECHOCARDIOGRAM (TEE) APPLICATION OF CELL SAVER     Patient location during evaluation: SICU Anesthesia Type: General Level of consciousness: sedated Pain management: pain level controlled Vital Signs Assessment: post-procedure vital signs reviewed and stable Respiratory status: patient remains intubated per anesthesia plan Cardiovascular status: stable Postop Assessment: no apparent nausea or vomiting Anesthetic complications: no   No notable events documented.  Last Vitals:  Vitals:   09/24/21 0400 09/24/21 0900  BP: 124/78 128/90  Pulse: 75 84  Resp: 12 16  Temp: 36.7 C 36.7 C  SpO2: 96% 97%    Last Pain:  Vitals:   09/24/21 0900  TempSrc: Oral  PainSc: 0-No pain                 Shray Hunley

## 2021-09-24 NOTE — Plan of Care (Signed)
  Problem: Health Behavior/Discharge Planning: Goal: Ability to manage health-related needs will improve Outcome: Progressing   Problem: Clinical Measurements: Goal: Will remain free from infection Outcome: Progressing Goal: Diagnostic test results will improve Outcome: Progressing Goal: Respiratory complications will improve Outcome: Progressing Goal: Cardiovascular complication will be avoided Outcome: Progressing   Problem: Activity: Goal: Risk for activity intolerance will decrease Outcome: Progressing   Problem: Nutrition: Goal: Adequate nutrition will be maintained Outcome: Progressing   

## 2021-09-24 NOTE — Progress Notes (Signed)
Pt eager to d/c. Needed reminders to standing following sternal precautions. Walked short distance in hall to ensure he could walk without RW. Tolerated well, steady, independent. Reminders given for ambulation at home. Levasy, ACSM 9:38 AM 09/24/2021

## 2021-09-24 NOTE — Progress Notes (Addendum)
      [address]       [place] [address]             [phone]      4 Days Post-Op Procedure(s) (LRB): AORTIC VALVE REPLACEMENT (AVR) USING 25 MM INSPIRIS RESILIA  AORTIC VALVE (N/A) CLIPPING OF ATRIAL APPENDAGE USING ATRICURE 35 MM ATRICLIP (Left) TRANSESOPHAGEAL ECHOCARDIOGRAM (TEE) (N/A) APPLICATION OF CELL SAVER (N/A)  Subjective:  Patient is doing well.  He has no complaints.    He is feeling up to going home.  Objective: Vital signs in last 24 hours: Temp:  [97.6 F (36.4 C)-98.6 F (37 C)] 98.1 F (36.7 C) (11/11 0400) Pulse Rate:  [75-89] 75 (11/11 0400) Cardiac Rhythm: Normal sinus rhythm (11/10 2300) Resp:  [12-22] 12 (11/11 0400) BP: (124-158)/(65-83) 124/78 (11/11 0400) SpO2:  [95 %-97 %] 96 % (11/11 0400) Weight:  [110 kg] 110 kg (11/11 0400)  General appearance: alert, cooperative, and no distress Heart: regular rate and rhythm Lungs: clear to auscultation bilaterally Abdomen: soft, non-tender; bowel sounds normal; no masses,  no organomegaly Extremities: extremities normal, atraumatic, no cyanosis or edema Wound: clean and dry  Lab Results: Recent Labs    09/22/21 0356 09/23/21 0053  WBC 15.6* 13.7*  HGB 11.8* 11.3*  HCT 35.4* 32.9*  PLT 121* 120*   BMET:  Recent Labs    09/22/21 0356 09/23/21 0053  NA 133* 136  K 4.0 4.0  CL 96* 98  CO2 27 30  GLUCOSE 160* 146*  BUN 9 10  CREATININE 0.71 0.67  CALCIUM 8.3* 8.7*    PT/INR: No results for input(s): LABPROT, INR in the last 72 hours. ABG    Component Value Date/Time   PHART 7.317 (L) 09/20/2021 2359   HCO3 25.5 09/20/2021 2359   TCO2 27 09/20/2021 2359   ACIDBASEDEF 1.0 09/20/2021 2359   O2SAT 96.0 09/20/2021 2359   CBG (last 3)  Recent Labs    09/23/21 1605 09/23/21 2112 09/24/21 0612  GLUCAP 143* 167* 155*    Assessment/Plan: S/P Procedure(s) (LRB): AORTIC VALVE REPLACEMENT (AVR) USING 25 MM INSPIRIS RESILIA  AORTIC VALVE (N/A) CLIPPING OF ATRIAL  APPENDAGE USING ATRICURE 35 MM ATRICLIP (Left) TRANSESOPHAGEAL ECHOCARDIOGRAM (TEE) (N/A) APPLICATION OF CELL SAVER (N/A)  CV- NSR, BP improved- continue Lopressor, Lisinopril.. will resume Eliquis today Pulm- no acute issues, continue IS Renal- weight stable, no LE edema, will continue Lasix, potassium for a week DM- sugars controlled, will resume Metformin Dispo- patient stable, will d/c home today   LOS: 4 days    Ellwood Handler, PA-C 09/24/2021   Chart reviewed, patient examined, agree with above. He looks good and feels well. Plan to send home today.

## 2021-09-27 ENCOUNTER — Telehealth (HOSPITAL_COMMUNITY): Payer: Self-pay

## 2021-09-27 ENCOUNTER — Other Ambulatory Visit: Payer: Self-pay | Admitting: Physician Assistant

## 2021-09-27 NOTE — Telephone Encounter (Signed)
Called patient to see if he is interested in the Cardiac Rehab Program. Patient expressed interest. Explained scheduling process and went over insurance, patient verbalized understanding. Will contact patient for scheduling once f/u has been completed.  °

## 2021-09-27 NOTE — Telephone Encounter (Signed)
Will check insurance benefits closer to scheduling and/or into the new year 2023. 

## 2021-09-29 ENCOUNTER — Telehealth: Payer: Self-pay

## 2021-09-29 NOTE — Progress Notes (Deleted)
Cardiology Office Note    Date:  09/29/2021   ID:  VERLON Tucker, DOB Feb 01, 1951, MRN [id number]   PCP:  Denita Lung, MD   [place]  Cardiologist:  [name], MD   Advanced Practice Provider:  No care team member to display Electrophysiologist:  [name], MD   [phone]   No chief complaint on file.   History of Present Illness:  Jeremy Tucker is a 70 y.o. male with a history of hypertension, hyperlipidemia, diabetes with peripheral neuropathy, paroxysmal atrial fibrillation status post ablation on Eliquis, OSA intolerant of CPAP, prostate cancer status post radioactive seed implant in April 2022, and aortic stenosis, mild nonobstructive CAD on cath 08/04/2021  Patient underwent AVR with bioprosthetic valve and clipping of his LAA appendage 09/20/2021  Past Medical History:  Diagnosis Date   Anxiety    BPH (benign prostatic hyperplasia)    BPH without obstruction/lower urinary tract symptoms    Depression    Diabetic peripheral neuropathy ([place]) 01/07/2019   Dyspnea    w/exertion   ED (erectile dysfunction)    Essential hypertension    GERD (gastroesophageal reflux disease)    Hiatal hernia    History of chronic bronchitis    per pt used inhaler as needed   History of DVT of lower extremity 2001   s/p  left TKA completed blood thinner treatment,  per pt no previous clot and none since   History of gastric polyp    per pt benign   History of kidney stones    Hyperlipidemia    Mild CAD cardiologist--- dr h. Tamala Julian   nuclear study 04-14-2017 normal , low risk, nuclear ef 53%;  cardiac cath 09-19-2018  midRCA 30% stenosis otherwise normal coronaries,  heavy AV calicification with decreased mobility with peak grandiant 13mmHg   Mild dilation of ascending aorta (Jeremy Tucker)    per echo 01-13-2021  62mm   Nephrolithiasis    OA (osteoarthritis)    OSA (obstructive sleep apnea)    per pt refused cpap, intolerant;  moderate osa per study in  epic 03-08-2015   PAF (paroxysmal atrial fibrillation) (Jeremy Tucker)    followed by dr h. 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/2018   Prostate cancer Jeremy Tucker) urologist--- dr dahlstedt/  oncology-- dr Tammi Klippel   first dx 03/ 2021,  Glesaon 3+4  PSA 6.95   Pulmonary nodule 2019   incidently finding on CTA 09-25-2018, stable   Right carpal tunnel syndrome 01/07/2019   Severe aortic stenosis    Type 2 diabetes mellitus (Jeremy Tucker)    followed by pcp   (03-02-2021  pt stated does not check blood sugar at home)   Umbilical hernia     Past Surgical History:  Procedure Laterality Date   AORTIC VALVE REPLACEMENT N/A 09/20/2021   Procedure: AORTIC VALVE REPLACEMENT (AVR) USING 25 MM INSPIRIS RESILIA  AORTIC VALVE;  Surgeon: [name], MD;  Location: [facility];  Service: Open Heart Surgery;  Laterality: N/A;   ATRIAL FIBRILLATION ABLATION N/A 03/12/2015   Procedure: ATRIAL FIBRILLATION ABLATION;  Surgeon: [name], MD;  Location: [facility] CATH LAB;  Service: Cardiovascular;  Laterality: N/A;   CARDIAC CATHETERIZATION  1990's   "Dr. Melvern Banker", no blockages per pt   CARDIOVERSION N/A 02/25/2015   Procedure: CARDIOVERSION;  Surgeon: Sueanne Margarita, MD;  Location: [facility];  Service: Cardiovascular;  Laterality: N/A;  CATARACT EXTRACTION W/ INTRAOCULAR LENS  IMPLANT, BILATERAL  2016   CLIPPING OF ATRIAL APPENDAGE Left 09/20/2021   Procedure: CLIPPING OF ATRIAL APPENDAGE USING ATRICURE 72 MM ATRICLIP;  Surgeon: [name], MD;  Location: [facility];  Service: Open Heart Surgery;  Laterality: Left;   COLONOSCOPY  last one 2013  dr Earlean Shawl   CYSTOSCOPY N/A 03/08/2021   Procedure: CYSTOSCOPY BLADDER CALCULI EXTRACTION ;  Surgeon: Franchot Gallo, MD;  [facility];  Service: Urology;  Laterality: N/A;   CYSTOSCOPY WITH RETROGRADE PYELOGRAM, URETEROSCOPY AND STENT PLACEMENT Left 04/24/2015   Procedure: URETHRAL MEATAL DILATION,  CYSTOSCOPY WITH LEFT RETROGRADE PYELOGRAM, LEFT URETEROSCOPY, STONE BASKET EXTRACTION,  LEFT DOUBLE J STENT PLACEMENT;  Surgeon: Carolan Clines, MD;  Location: [facility];  Service: Urology;  Laterality: Left;   EP IMPLANTABLE DEVICE N/A 09/15/2015   Procedure: Loop Recorder Insertion;  Surgeon: [name], MD;  Location: [facility] CV LAB;  Service: Cardiovascular;  Laterality: N/A;   ESOPHAGOGASTRODUODENOSCOPY  last one 2017   EXTRACORPOREAL SHOCK WAVE LITHOTRIPSY Bilateral 04/02/2020   Procedure: EXTRACORPOREAL SHOCK WAVE LITHOTRIPSY (ESWL);  Surgeon: Cleon Gustin, MD;  Location: [facility];  Service: Urology;  Laterality: Bilateral;   EXTRACORPOREAL SHOCK WAVE LITHOTRIPSY Left 04/06/2020   Procedure: EXTRACORPOREAL SHOCK WAVE LITHOTRIPSY (ESWL);  Surgeon: [name], MD;  Location[facility];  Service: Urology;  Laterality: Left;   EYE SURGERY     HOLMIUM LASER APPLICATION Left 96/02/5408   Procedure: HOLMIUM LASER APPLICATION;  Surgeon: Carolan Clines, MD;  Location: [facility];  Service: Urology;  Laterality: Left;   implantable loopr recorder removal  02/22/2021   MDT LINQ removed in office by Dr Allred   JOINT REPLACEMENT     KNEE ARTHROSCOPY Bilateral "multiple times"   LEFT HEART CATH AND CORONARY ANGIOGRAPHY N/A 09/19/2018   Procedure: LEFT HEART CATH AND CORONARY ANGIOGRAPHY;  Surgeon: Belva Crome, MD;  Location: [facility];  Service: Cardiovascular;  Laterality: N/A;   PERCUTANEOUS NEPHROSTOLITHOTOMY  1980s and 1990s   RADIOACTIVE SEED IMPLANT N/A 03/08/2021   Procedure: RADIOACTIVE SEED IMPLANT/BRACHYTHERAPY IMPLANT;  Surgeon: Franchot Gallo, MD;  Location[facility];  Service: Urology;  Laterality: N/A;  90 MINS   RIGHT/LEFT HEART CATH AND CORONARY ANGIOGRAPHY N/A 08/04/2021   Procedure: RIGHT/LEFT HEART CATH AND CORONARY ANGIOGRAPHY;  Surgeon: Belva Crome, MD;  Location: [facility] CV LAB;   Service: Cardiovascular;  Laterality: N/A;   SKIN BIOPSY Left 05/06/2020   Shave biopsy left posterior hand neurofiberoma.   SPACE OAR INSTILLATION N/A 03/08/2021   Procedure: SPACE OAR INSTILLATION;  Surgeon: Franchot Gallo, MD;  Location: [facility];  Service: Urology;  Laterality: N/A;   TEE WITHOUT CARDIOVERSION N/A 03/12/2015   Procedure: TRANSESOPHAGEAL ECHOCARDIOGRAM (TEE);  Surgeon: Pixie Casino, MD;  Location: [place];  Service: Cardiovascular;  Laterality: N/A;   TEE WITHOUT CARDIOVERSION N/A 09/20/2021   Procedure: TRANSESOPHAGEAL ECHOCARDIOGRAM (TEE);  Surgeon: [name], MD;  Location: [facility];  Service: Open Heart Surgery;  Laterality: N/A;   TONSILLECTOMY  age 31   TOTAL KNEE ARTHROPLASTY Left 11-22-1999  @[facility]    TOTAL KNEE ARTHROPLASTY Right 10/30/2017   Procedure: RIGHT TOTAL KNEE ARTHROPLASTY;  Surgeon: Paralee Cancel, MD;  Location: [facility];  Service: Orthopedics;  Laterality: Right;  90 mins   UMBILICAL HERNIA REPAIR N/A 07/05/2021   Procedure: REPAIR INCARCERATED UMBILICAL HERNIA WITH MESH;  Surgeon: Georganna Skeans, MD;  Location: [facility]  OR;  Service: General;  Laterality: N/A;    Current Medications: No outpatient medications have been marked as taking for the 10/13/21 encounter (Appointment) with Imogene Burn, PA-C.     Allergies:   Oxycodone   Social History   Socioeconomic History   Marital status: Married    Spouse name: Not on file   Number of children: 1   Years of education: Not on file   Highest education level: Some college, no degree  Occupational History   Occupation: Retired  Tobacco Use   Smoking status: Never   Smokeless tobacco: Never  Vaping Use   Vaping Use: Never used  Substance and Sexual Activity   Alcohol use: Yes    Comment: very rare   Drug use: Never   Sexual activity: Yes  Other Topics Concern   Not on file  Social History Narrative   Pt lives in [place] with spouse.  70 yo son is Tucker.   Second son died in car accident.      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   ROS:   Please see the history of present illness.    ROS All other systems reviewed and are negative.   PHYSICAL EXAM:   VS:  There were no vitals taken for this visit.  Physical Exam  GEN: Well nourished, well developed, in no acute distress  HEENT: normal  Neck: no JVD, carotid bruits, or masses Cardiac:RRR; no murmurs, rubs, or gallops  Respiratory:  clear to auscultation bilaterally, normal work of breathing GI: soft, nontender, nondistended, + BS Ext: without cyanosis, clubbing, or edema, Good distal pulses bilaterally MS: no deformity or atrophy  Skin: warm and dry, no rash Neuro:  Alert and Oriented x 3, Strength and sensation are intact Psych: euthymic mood, full affect  Wt Readings from Last 3 Encounters:  09/24/21 242 lb 8.1 oz (110 kg)  09/16/21 231 lb (104.8 kg)  09/15/21 231 lb (104.8 kg)      Studies/Labs Reviewed:   EKG:  EKG is*** ordered today.  The ekg ordered today demonstrates ***  Recent Labs: 07/27/2021: TSH 2.280 09/16/2021: ALT 38 09/21/2021: Magnesium 2.1 09/23/2021: BUN 10; Creatinine, Ser 0.67; Hemoglobin 11.3; Platelets 120; Potassium 4.0; Sodium 136   Lipid Panel    Component Value Date/Time   CHOL 137 04/01/2021 1002   TRIG 166 (H) 04/01/2021 1002   HDL 43 04/01/2021 1002   CHOLHDL 3.2 04/01/2021 1002   CHOLHDL 3.6 08/16/2016 0925   VLDL 28 08/16/2016 0925   LDLCALC 66 04/01/2021  1002    Additional studies/ records that were reviewed today include:  Echo 07/20/21 IMPRESSIONS    1. Left ventricular ejection fraction, by estimation, is 60 to 65%. The  left ventricle has normal function. The left ventricle has no regional  wall motion abnormalities. There is moderate asymmetric left ventricular  hypertrophy of the basal-septal  segment. Left ventricular diastolic parameters are consistent with Grade I  diastolic dysfunction (  impaired relaxation).   2. Right ventricular systolic function is normal. The right ventricular  size is normal. Tricuspid regurgitation signal is inadequate for assessing  PA pressure.   3. Left atrial size was moderately dilated.   4. The mitral valve is grossly normal. Mild mitral valve regurgitation.   5. The aortic valve is tricuspid. There is moderate calcification of the  aortic valve. Aortic valve regurgitation is mild. Severe aortic valve  stenosis. Aortic valve area, by VTI measures 0.98 cm. Aortic valve mean  gradient measures 44.0 mmHg. Aortic  valve Vmax measures 4.54 m/s. Peak gradient of 82 mmHg. DI is 0.20.   6. The inferior vena cava is normal in size with <50% respiratory  variability, suggesting right atrial pressure of 8 mmHg.   Comparison(s): Changes from prior study are noted. 01/13/2021: LVEF 60-65%,  moderate to severe AS - mean gradient 37 mmHg.    Treatments: surgery:    Cardiac Cath 08/04/2021: CONCLUSIONS: Normal left main 40% proximal RCA stenosis.  Right dominant anatomy. Widely patent circumflex. 20 to 30% mid LAD.  The LAD reaches the left ventricular apex. Normal pulmonary artery pressures, mean 23 mmHg. Pulmonary capillary wedge pressure mean, 13 mmHg.   RECOMMENDATIONS: Referred to the aortic valve clinic for consideration of TAVR versus SAVR  Risk Assessment/Calculations:   {Does this patient have ATRIAL FIBRILLATION?[phone]}     ASSESSMENT:    No diagnosis found.   PLAN:  In order of  problems listed above:  Severe aortic stenosis status post AVR with bioprosthetic valve and clipping of his LAA appendage 09/20/2021  Hypertension  Hyperlipidemia  DM with peripheral neuropathy  PAF status post ablation on Eliquis  OSA intolerant to CPAP  Mild nonobstructive CAD on cath 08/04/2021  Shared Decision Making/Informed Consent   {Are you ordering a CV Procedure (e.g. stress test, cath, DCCV, TEE, etc)?   Press F2        :[id number]}    Medication Adjustments/Labs and Tests Ordered: Current medicines are reviewed at length with the patient today.  Concerns regarding medicines are outlined above.  Medication changes, Labs and Tests ordered today are listed in the Patient Instructions below. There are no Patient Instructions on file for this visit.   Sumner Boast, PA-C  09/29/2021 3:00 PM    [facility] [address], [place], [place]  [address] Phone: [phone]; Fax: [phone]

## 2021-09-29 NOTE — Telephone Encounter (Signed)
Transition Care Management Follow-up Telephone Call Date of discharge and from where: 09/24/2021 / [facility] How have you been since you were released from the hospital? " Doing good. Tired and weak." Any questions or concerns? No  Items Reviewed: Did the pt receive and understand the discharge instructions provided? Yes  Medications obtained and verified? Yes  Other? No  Any new allergies since your discharge? No  Dietary orders reviewed? Yes Do you have support at home? Yes   Home Care and Equipment/Supplies: Were home health services ordered? not applicable If so, what is the name of the agency? N/a  Has the agency set up a time to come to the patient's home? not applicable Were any new equipment or medical supplies ordered?  No What is the name of the medical supply agency? N/a Were you able to get the supplies/equipment? not applicable Do you have any questions related to the use of the equipment or supplies? No  Functional Questionnaire: (I = Independent and D = Dependent) ADLs: I  Bathing/Dressing- I  Meal Prep- I  Eating- I  Maintaining continence- I  Transferring/Ambulation- I  Managing Meds- I  Follow up appointments reviewed:  PCP Hospital f/u appt confirmed? Yes  Scheduled to see Dr. Jill Alexanders,  on 12/09/2020 @ 10:30 am. [facility] f/u appt confirmed? Yes  Scheduled to see Ermalinda Barrios, PA on 10/04/2021 @ 10 am. Dr. Cyndia Bent 10/27/2021 at 12:30 pm Are transportation arrangements needed? No  If their condition worsens, is the pt aware to call PCP or go to the Emergency Dept.? Yes Was the patient provided with contact information for the PCP's office or ED? Yes Was to pt encouraged to call back with questions or concerns? Yes   Quinn Plowman RN,BSN,CCM RN Case Manager [place] [phone]

## 2021-09-30 ENCOUNTER — Telehealth: Payer: Self-pay | Admitting: Interventional Cardiology

## 2021-09-30 ENCOUNTER — Encounter: Payer: Self-pay | Admitting: *Deleted

## 2021-09-30 NOTE — Telephone Encounter (Signed)
Pt c/o BP issue: STAT if pt c/o blurred vision, one-sided weakness or slurred speech  1. What are your last 5 BP readings?  11/17: 93/59              97/61  2. Are you having any other symptoms (ex. Dizziness, headache, blurred vision, passed out)?  Weakness, fatigue  3. What is your BP issue?   Patient states his BP is extremely low and he has no energy. He is requesting to speak with Ermalinda Barrios, PA specifically regarding this issue.

## 2021-09-30 NOTE — Telephone Encounter (Signed)
Spoke with pt and made him aware that I have sent a MyChart message with the information about taking/holding medications for today and tomorrow until he can see Dr. Irish Lack.  Answered all of his questions.  Pt appreciative for call.

## 2021-09-30 NOTE — Telephone Encounter (Signed)
Patient called back asking that what he is suppose stop and start be sent to [facility] for as the medication. Please advise

## 2021-09-30 NOTE — Progress Notes (Signed)
Cardiology Office Note   Date:  10/01/2021   ID:  Jeremy Tucker, DOB August 25, 1951, MRN [id number]  PCP:  Jeremy Lung, MD    No chief complaint on file.  S/p AVR, new onset.DOE may use when he started) after today's presented with  Wt Readings from Last 3 Encounters:  10/01/21 223 lb 12.8 oz (101.5 kg)  09/24/21 242 lb 8.1 oz (110 kg)  09/16/21 231 lb (104.8 kg)       History of Present Illness: Jeremy Tucker is a 70 y.o. male  with severe AS treated with AVR in 09/2021.  " 70 year old gentleman with a history of hypertension, hyperlipidemia, diabetes with peripheral neuropathy, paroxysmal atrial fibrillation status post ablation on Eliquis, OSA intolerant of CPAP, prostate cancer status post radioactive seed implant in April 2022, and aortic stenosis.  He has been followed by Dr. Tamala Julian and an echo in March 2022 showed an increase in the mean gradient to 31.6 mmHg with a peak gradient of 56 mmHg.  Valve area was 1.2 cm.  His most recent echo on 07/20/2021 showed further progression to severe aortic stenosis with a mean gradient of 44 mmHg and a peak gradient of 82 mmHg.  Dimensionless index is 0.2 with a valve area of 0.98 cm.  Left ventricular ejection fraction is 60 to 65% with moderate asymmetric LVH of the basal-septal segment with grade 1 diastolic dysfunction.  There is mild mitral valve regurgitation.  He subsequently underwent cardiac catheterization on 08/04/2021 showing mild nonobstructive coronary disease with normal right heart pressures."  "[name] presented to [facility] on 09/20/2021.  He underwent Aortic Valve Replacement with a 25 mm Edwards Resilia Bioprosthetic valve and clipping of his LA appendage with a 35 mm Atricure Pro2 Clip.  He tolerated the procedure without difficulty and was taken to the SICU in stable condition. Jeremy Tucker.His pre op HGA1C 6.8. He will be restarted on Metformin closer to or at discharge. He was felt surgically stable for transfer from  the ICU to 4E for further convalescence on 11/09. He was weaned off oxygen and ambulating with good oxygenation on room air. He did have post op pain. He has an allergy to Oxy but tolerated Norco. He has been tolerating a diet and has had a bowel movement. His wound is clean, dry, and healing without sign of infection. Epicardial pacing wires were removed on 11/10.  He was hypertensive and started on low dose Cozaar.  He continues to maintain NSR.  He will be started on home Eliquis at time of discharge.  He is ambulating without difficulty.  His surgical incisions are healing without evidence of infection.  He is medically stable for discharge home today."  Patient called office: "Pt's wife checking BP about 4 times a day and BP has been low everyday.  Getting readings in the 90s/50-60s.  Sometimes readings are normal.  Pt very weak and fatigued.  Gets SOB when walking just a few feet that resolves with rest.  CP occasionally.  Not occurring right now.  States he hasn't been able to exercise since his surgery due to his fatigue and weakness.  Spoke with Dr. Gasper Sells and he said concerned for post op AF.  He advised to have pt hold Furosemide and Lisinopril tomorrow morning, continue Metoprolol and see Dr. Irish Lack as scheduled tomorrow. "  Low BP for a few days.  His energy level has dropped in the same few days.  He gets [facility] easily.  Denies : Chest pain. Dizziness. Leg edema. Nitroglycerin use. Orthopnea. Palpitations. Paroxysmal nocturnal dyspnea.  Syncope.    Past Medical History:  Diagnosis Date   Anxiety    BPH (benign prostatic hyperplasia)    BPH without obstruction/lower urinary tract symptoms    Depression    Diabetic peripheral neuropathy ([place]) 01/07/2019   Dyspnea    w/exertion   ED (erectile dysfunction)    Essential hypertension    GERD (gastroesophageal reflux disease)    Hiatal hernia    History of chronic bronchitis    per pt used inhaler as needed   History of DVT of  lower extremity 2001   s/p  left TKA completed blood thinner treatment,  per pt no previous clot and none since   History of gastric polyp    per pt benign   History of kidney stones    Hyperlipidemia    Mild CAD cardiologist--- dr h. Tamala Julian   nuclear study 04-14-2017 normal , low risk, nuclear ef 53%;  cardiac cath 09-19-2018  midRCA 30% stenosis otherwise normal coronaries,  heavy AV calicification with decreased mobility with peak grandiant 57mmHg   Mild dilation of ascending aorta ([place])    per echo 01-13-2021  57mm   Nephrolithiasis    OA (osteoarthritis)    OSA (obstructive sleep apnea)    per pt refused cpap, intolerant;  moderate osa per study in epic 03-08-2015   PAF (paroxysmal atrial fibrillation) ([place])    followed by dr h. 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/2018   Prostate cancer [facility]) urologist--- dr dahlstedt/  oncology-- dr Tammi Klippel   first dx 03/ 2021,  Glesaon 3+4  PSA 6.95   Pulmonary nodule 2019   incidently finding on CTA 09-25-2018, stable   Right carpal tunnel syndrome 01/07/2019   Severe aortic stenosis    Type 2 diabetes mellitus ([place])    followed by pcp   (03-02-2021  pt stated does not check blood sugar at home)   Umbilical hernia     Past Surgical History:  Procedure Laterality Date   AORTIC VALVE REPLACEMENT N/A 09/20/2021   Procedure: AORTIC VALVE REPLACEMENT (AVR) USING 25 MM INSPIRIS RESILIA  AORTIC VALVE;  Surgeon: [name], MD;  Location: [place] OR;  Service: Open Heart Surgery;  Laterality: N/A;   ATRIAL FIBRILLATION ABLATION N/A 03/12/2015   Procedure: ATRIAL FIBRILLATION ABLATION;  Surgeon: [name], MD;  Location: [facility] CATH LAB;  Service: Cardiovascular;  Laterality: N/A;   CARDIAC CATHETERIZATION  1990's   "Dr. Melvern Banker", no blockages per pt   CARDIOVERSION N/A 02/25/2015   Procedure: CARDIOVERSION;  Surgeon: Sueanne Margarita, MD;  Location: [place];  Service:  Cardiovascular;  Laterality: N/A;   CATARACT EXTRACTION W/ INTRAOCULAR LENS  IMPLANT, BILATERAL  2016   CLIPPING OF ATRIAL APPENDAGE Left 09/20/2021   Procedure: CLIPPING OF ATRIAL APPENDAGE USING ATRICURE 40 MM ATRICLIP;  Surgeon: [name], MD;  Location: [place];  Service: Open Heart Surgery;  Laterality: Left;   COLONOSCOPY  last one 2013  dr Earlean Shawl   CYSTOSCOPY N/A 03/08/2021   Procedure: CYSTOSCOPY BLADDER CALCULI EXTRACTION ;  Surgeon: Franchot Gallo, MD;  Location: [facility];  Service: Urology;  Laterality: N/A;   CYSTOSCOPY WITH RETROGRADE PYELOGRAM, URETEROSCOPY AND STENT PLACEMENT Left 04/24/2015   Procedure: URETHRAL MEATAL DILATION, CYSTOSCOPY WITH LEFT RETROGRADE PYELOGRAM, LEFT URETEROSCOPY, STONE BASKET EXTRACTION,  LEFT DOUBLE J STENT  PLACEMENT;  Surgeon: Carolan Clines, MD;  Location: [facility];  Service: Urology;  Laterality: Left;   EP IMPLANTABLE DEVICE N/A 09/15/2015   Procedure: Loop Recorder Insertion;  Surgeon: [name], MD;  Location: [place] CV LAB;  Service: Cardiovascular;  Laterality: N/A;   ESOPHAGOGASTRODUODENOSCOPY  last one 2017   EXTRACORPOREAL SHOCK WAVE LITHOTRIPSY Bilateral 04/02/2020   Procedure: EXTRACORPOREAL SHOCK WAVE LITHOTRIPSY (ESWL);  Surgeon: Cleon Gustin, MD;  Location: [facility];  Service: Urology;  Laterality: Bilateral;   EXTRACORPOREAL SHOCK WAVE LITHOTRIPSY Left 04/06/2020   Procedure: EXTRACORPOREAL SHOCK WAVE LITHOTRIPSY (ESWL);  Surgeon: [name], MD;  Location: [facility];  Service: Urology;  Laterality: Left;   EYE SURGERY     HOLMIUM LASER APPLICATION Left 25/85/2778   Procedure: HOLMIUM LASER APPLICATION;  Surgeon: Carolan Clines, MD;  Location: [facility];  Service: Urology;  Laterality: Left;   implantable loopr recorder removal  02/22/2021   MDT LINQ removed in office by Dr Allred   JOINT REPLACEMENT     KNEE ARTHROSCOPY Bilateral "multiple times"    LEFT HEART CATH AND CORONARY ANGIOGRAPHY N/A 09/19/2018   Procedure: LEFT HEART CATH AND CORONARY ANGIOGRAPHY;  Surgeon: Belva Crome, MD;  Location: [place] CV LAB;  Service: Cardiovascular;  Laterality: N/A;   PERCUTANEOUS NEPHROSTOLITHOTOMY  1980s and 1990s   RADIOACTIVE SEED IMPLANT N/A 03/08/2021   Procedure: RADIOACTIVE SEED IMPLANT/BRACHYTHERAPY IMPLANT;  Surgeon: Franchot Gallo, MD;  Location: [facility];  Service: Urology;  Laterality: N/A;  90 MINS   RIGHT/LEFT HEART CATH AND CORONARY ANGIOGRAPHY N/A 08/04/2021   Procedure: RIGHT/LEFT HEART CATH AND CORONARY ANGIOGRAPHY;  Surgeon: Belva Crome, MD;  Location: [place] CV LAB;  Service: Cardiovascular;  Laterality: N/A;   SKIN BIOPSY Left 05/06/2020   Shave biopsy left posterior hand neurofiberoma.   SPACE OAR INSTILLATION N/A 03/08/2021   Procedure: SPACE OAR INSTILLATION;  Surgeon: Franchot Gallo, MD;  Location: [facility];  Service: Urology;  Laterality: N/A;   TEE WITHOUT CARDIOVERSION N/A 03/12/2015   Procedure: TRANSESOPHAGEAL ECHOCARDIOGRAM (TEE);  Surgeon: Pixie Casino, MD;  Location: [place];  Service: Cardiovascular;  Laterality: N/A;   TEE WITHOUT CARDIOVERSION N/A 09/20/2021   Procedure: TRANSESOPHAGEAL ECHOCARDIOGRAM (TEE);  Surgeon: [name], MD;  Location: [place];  Service: Open Heart Surgery;  Laterality: N/A;   TONSILLECTOMY  age 65   TOTAL KNEE ARTHROPLASTY Left 11-22-1999  @[facility]    TOTAL KNEE ARTHROPLASTY Right 10/30/2017   Procedure: RIGHT TOTAL KNEE ARTHROPLASTY;  Surgeon: Paralee Cancel, MD;  Location: [facility];  Service: Orthopedics;  Laterality: Right;  90 mins   UMBILICAL HERNIA REPAIR N/A 07/05/2021   Procedure: REPAIR INCARCERATED UMBILICAL HERNIA WITH MESH;  Surgeon: Georganna Skeans, MD;  Location: [place];  Service: General;  Laterality: N/A;     Current Outpatient Medications  Medication Sig Dispense Refill   acetaminophen (TYLENOL) 500 MG tablet  Take 1,000 mg by mouth every 8 (eight) hours as needed for mild pain.      albuterol (VENTOLIN HFA) 108 (90 Base) MCG/ACT inhaler INHALE 2 PUFFS INTO THE LUNGS EVERY 6 HOURS AS NEEDED FOR WHEEZING OR SHORTNESS OF BREATH 54 g 0   allopurinol (ZYLOPRIM) 300 MG tablet Take 1 tablet (300 mg total) by mouth daily. 90 tablet 3   ammonium lactate (LAC-HYDRIN) 12 % lotion Apply 1 application topically daily as needed for dry skin.     aspirin EC 81 MG tablet Take 1  tablet (81 mg total) by mouth daily. Swallow whole. 150 tablet 2   atorvastatin (LIPITOR) 40 MG tablet Take 1 tablet (40 mg total) by mouth at bedtime. 90 tablet 3   buPROPion (WELLBUTRIN XL) 300 MG 24 hr tablet Take 1 tablet (300 mg total) by mouth daily. (Patient taking differently: Take 150 mg by mouth in the morning.) 90 tablet 1   ELIQUIS 5 MG TABS tablet TAKE ONE TABLET BY MOUTH TWICE A DAY 180 tablet 1   fluticasone (FLONASE) 50 MCG/ACT nasal spray Place 2 sprays into both nostrils daily.     furosemide (LASIX) 40 MG tablet Take 1 tablet (40 mg total) by mouth daily. 7 tablet 0   HYDROcodone-acetaminophen (NORCO/VICODIN) 5-325 MG tablet Take 1 tablet by mouth every 4 (four) hours as needed for moderate pain. 30 tablet 0   lisinopril (ZESTRIL) 5 MG tablet Take 1 tablet (5 mg total) by mouth daily. 30 tablet 3   loratadine (CLARITIN) 10 MG tablet Take 10 mg by mouth in the morning.     LORazepam (ATIVAN) 0.5 MG tablet TAKE 1 TABLET TWICE DAILY AS NEEDED FOR ANXIETY. 20 tablet 0   metFORMIN (GLUCOPHAGE) 500 MG tablet Take 1 tablet (500 mg total) by mouth 2 (two) times daily with a meal. 180 tablet 3   metoprolol tartrate (LOPRESSOR) 25 MG tablet Take 1 tablet (25 mg total) by mouth 2 (two) times daily. 60 tablet 3   Multiple Vitamins-Minerals (MULTIVITAMIN WITH MINERALS) tablet Take 1 tablet by mouth daily.     olopatadine (PATANOL) 0.1 % ophthalmic solution Place 1 drop into both eyes daily as needed for allergies (tearing).      Omega-3  Fatty Acids (FISH OIL) 1000 MG CAPS Take 1,000 mg by mouth daily.     omeprazole (PRILOSEC) 20 MG capsule Take 20 mg by mouth daily.     potassium chloride SA (KLOR-CON) 20 MEQ tablet Take 1 tablet (20 mEq total) by mouth daily. 7 tablet 0   sertraline (ZOLOFT) 50 MG tablet Take 1 tablet (50 mg total) by mouth daily. 90 tablet 1   sildenafil (VIAGRA) 100 MG tablet Take 1 tablet (100 mg total) by mouth daily as needed for erectile dysfunction. 20 tablet 1   tamsulosin (FLOMAX) 0.4 MG CAPS capsule Take 0.4 mg by mouth at bedtime.     zolpidem (AMBIEN) 5 MG tablet Take 1 tablet (5 mg total) by mouth at bedtime as needed for sleep. 15 tablet 1   No current facility-administered medications for this visit.    Allergies:   Oxycodone    Social History:  The patient  reports that he has never smoked. He has never used smokeless tobacco. He reports current alcohol use. He reports that he does not use drugs.   Family History:  The patient's family history includes Cancer in his maternal grandfather; Healthy in his brother; Heart attack (age of onset: 68) in his father; Heart attack (age of onset: 63) in his mother.    ROS:  Please see the history of present illness.   Otherwise, review of systems are positive for fatigue- very similar to when he had AFib in the past.   All other systems are reviewed and negative.    PHYSICAL EXAM: VS:  BP (!) 90/56   Pulse (!) 107   Ht 6' (1.829 m)   Wt 223 lb 12.8 oz (101.5 kg)   SpO2 94%   BMI 30.35 kg/m  , BMI Body mass index is 30.35 kg/m.  GEN: Well nourished, well developed, in no acute distress HEENT: normal Neck: no JVD, carotid bruits, or masses Cardiac: irregularly irregular ; no murmurs, rubs, or gallops,no edema  Respiratory:  diminished breath sounds at the right base to auscultation bilaterally, normal work of breathing GI: soft, nontender, nondistended, + BS MS: no deformity or atrophy Skin: warm and dry, no rash Neuro:  Strength and  sensation are intact Psych: euthymic mood, full affect   EKG:   The ekg ordered today demonstrates AFib with RVR   Recent Labs: 07/27/2021: TSH 2.280 09/16/2021: ALT 38 09/21/2021: Magnesium 2.1 09/23/2021: BUN 10; Creatinine, Ser 0.67; Hemoglobin 11.3; Platelets 120; Potassium 4.0; Sodium 136   Lipid Panel    Component Value Date/Time   CHOL 137 04/01/2021 1002   TRIG 166 (H) 04/01/2021 1002   HDL 43 04/01/2021 1002   CHOLHDL 3.2 04/01/2021 1002   CHOLHDL 3.6 08/16/2016 0925   VLDL 28 08/16/2016 0925   LDLCALC 66 04/01/2021 1002     Other studies Reviewed: Additional studies/ records that were reviewed today with results demonstrating: Hbg 11.3 on 09/23/2021.   ASSESSMENT AND PLAN:  S/p AVR: SBE prophylaxis. Did well post procedure with acute change a few days ago.  Low BP: Systolics in the 41L, borederline low.  Continue to hold lisinopril.  Monitor at home.  Continue metoprolol.  When BP goes back to 130-140 range, could restart lisinopril.  DOE: Check CBC, BMet and BNP.  Likely related to loss of atrial kick.  AFib: Back in AFib.  Likely went back in to AFib a few days ago, days after he resumed his Eliquis at hospital discharge.  Continue Eliquis.  Start Amiodarone 400 mg BID x 2 weeks.  Hopefully he can decrease to 400 mg daily after that time and this can be stopped.     Current medicines are reviewed at length with the patient today.  The patient concerns regarding his medicines were addressed.  The following changes have been made:  No change  Labs/ tests ordered today include:  No orders of the defined types were placed in this encounter.   Recommend 150 minutes/week of aerobic exercise Low fat, low carb, high fiber diet recommended  Disposition:   FU in as scheduled   Signed, Larae Grooms, MD  10/01/2021 11:48 AM    [facility] [address], [place], [place]  [address] Phone: [phone]; Fax: [phone]

## 2021-09-30 NOTE — Telephone Encounter (Signed)
See other phone note

## 2021-09-30 NOTE — Telephone Encounter (Signed)
Patient states that he think his cleaning lady through away his medicine he received from the hospital. Please advise

## 2021-09-30 NOTE — Telephone Encounter (Signed)
Pt's wife checking BP about 4 times a day and BP has been low everyday.  Getting readings in the 90s/50-60s.  Sometimes readings are normal.  Pt very weak and fatigued.  Gets SOB when walking just a few feet that resolves with rest.  CP occasionally.  Not occurring right now.  States he hasn't been able to exercise since his surgery due to his fatigue and weakness.  Spoke with Dr. Gasper Sells and he said concerned for post op AF.  He advised to have pt hold Furosemide and Lisinopril tomorrow morning, continue Metoprolol and see Dr. Irish Lack as scheduled tomorrow.  Spoke with pt and made him aware.  He is agreeable to plan.  He is aware when it is appropriate to go to ER for eval.

## 2021-10-01 ENCOUNTER — Encounter: Payer: Self-pay | Admitting: Interventional Cardiology

## 2021-10-01 ENCOUNTER — Other Ambulatory Visit: Payer: Self-pay | Admitting: Urology

## 2021-10-01 ENCOUNTER — Other Ambulatory Visit: Payer: Self-pay

## 2021-10-01 ENCOUNTER — Ambulatory Visit: Payer: PPO | Admitting: Interventional Cardiology

## 2021-10-01 VITALS — BP 90/56 | HR 107 | Ht 72.0 in | Wt 223.8 lb

## 2021-10-01 DIAGNOSIS — R0602 Shortness of breath: Secondary | ICD-10-CM | POA: Diagnosis not present

## 2021-10-01 DIAGNOSIS — Z952 Presence of prosthetic heart valve: Secondary | ICD-10-CM

## 2021-10-01 DIAGNOSIS — I959 Hypotension, unspecified: Secondary | ICD-10-CM

## 2021-10-01 DIAGNOSIS — I480 Paroxysmal atrial fibrillation: Secondary | ICD-10-CM

## 2021-10-01 DIAGNOSIS — I4891 Unspecified atrial fibrillation: Secondary | ICD-10-CM | POA: Diagnosis not present

## 2021-10-01 MED ORDER — AMIODARONE HCL 400 MG PO TABS: 400.0000 mg | ORAL_TABLET | Freq: Two times a day (BID) | ORAL | 6 refills | Status: DC

## 2021-10-01 NOTE — Patient Instructions (Signed)
Medication Instructions:  Your physician has recommended you make the following change in your medication: Start amiodarone 400 mg by mouth twice daily  *If you need a refill on your cardiac medications before your next appointment, please call your pharmacy*   Lab Work: Lab work to be done today--BMP, CBC, BNP If you have labs (blood work) drawn today and your tests are completely normal, you will receive your results only by: [place] (if you have MyChart) OR A paper copy in the mail If you have any lab test that is abnormal or we need to change your treatment, we will call you to review the results.   Testing/Procedures: none   Follow-Up: At [facility], you and your health needs are our priority.  As part of our continuing mission to provide you with exceptional heart care, we have created designated Provider Care Teams.  These Care Teams include your primary Cardiologist (physician) and Advanced Practice Providers (APPs -  Physician Assistants and Nurse Practitioners) who all work together to provide you with the care you need, when you need it.  We recommend signing up for the patient portal called "MyChart".  Sign up information is provided on this After Visit Summary.  MyChart is used to connect with patients for Virtual Visits (Telemedicine).  Patients are able to view lab/test results, encounter notes, upcoming appointments, etc.  Non-urgent messages can be sent to your provider as well.   To learn more about what you can do with MyChart, go to [email or web address].    Your next appointment:   As planned  The format for your next appointment:   In Person  Provider:   Ermalinda Barrios, PA-C

## 2021-10-02 LAB — BASIC METABOLIC PANEL
BUN/Creatinine Ratio: 20 (ref 10–24)
BUN: 19 mg/dL (ref 8–27)
CO2: 25 mmol/L (ref 20–29)
Calcium: 10.2 mg/dL (ref 8.6–10.2)
Chloride: 100 mmol/L (ref 96–106)
Creatinine, Ser: 0.95 mg/dL (ref 0.76–1.27)
Glucose: 156 mg/dL — ABNORMAL HIGH (ref 70–99)
Potassium: 4.9 mmol/L (ref 3.5–5.2)
Sodium: 141 mmol/L (ref 134–144)
eGFR: 86 mL/min/{1.73_m2} (ref >59)

## 2021-10-02 LAB — CBC
Hematocrit: 36.5 % — ABNORMAL LOW (ref 37.5–51.0)
Hemoglobin: 12.9 g/dL — ABNORMAL LOW (ref 13.0–17.7)
MCH: 31.9 pg (ref 26.6–33.0)
MCHC: 35.3 g/dL (ref 31.5–35.7)
MCV: 90 fL (ref 79–97)
Platelets: 444 10*3/uL (ref 150–450)
RBC: 4.05 x10E6/uL — ABNORMAL LOW (ref 4.14–5.80)
RDW: 12.3 % (ref 11.6–15.4)
WBC: 12.8 10*3/uL — ABNORMAL HIGH (ref 3.4–10.8)

## 2021-10-02 LAB — PRO B NATRIURETIC PEPTIDE: NT-Pro BNP: 1168 pg/mL — ABNORMAL HIGH (ref 0–376)

## 2021-10-03 ENCOUNTER — Telehealth: Payer: Self-pay | Admitting: Physician Assistant

## 2021-10-03 NOTE — Telephone Encounter (Signed)
   The patient's wife called the answering service after-hours today. Delayed callback due to high volume calls to the answering service around this same time. Patient had recent valve surgery. Wife called because he is very weak, "moping," "screaming" that he's very unhappy, does not feel good. Pulse ox 95%, HR 79, BP 117/101. No specific focal symptoms. Wife is frustrated because she is not sure what's wrong with him, seeking advice. Unfortunately with normal VS and vague symptoms it is impossible to fully evaluate him over the phone - needs in person evaluation. Offered for them to proceed to ED for evaluation/bloodwork, they will consider but she thinks she will likely instead plan to call the office to get an appt. Will route to triage for review in AM and consideration of getting him scheduled on DOD schedule/Dr. Tamala Julian if they elect not to go to ER.  Charlie Pitter, PA-C

## 2021-10-04 ENCOUNTER — Other Ambulatory Visit: Payer: Self-pay | Admitting: *Deleted

## 2021-10-04 ENCOUNTER — Encounter: Payer: Self-pay | Admitting: Interventional Cardiology

## 2021-10-04 ENCOUNTER — Ambulatory Visit (INDEPENDENT_AMBULATORY_CARE_PROVIDER_SITE_OTHER): Payer: Self-pay | Admitting: *Deleted

## 2021-10-04 ENCOUNTER — Other Ambulatory Visit: Payer: Self-pay

## 2021-10-04 ENCOUNTER — Telehealth: Payer: Self-pay | Admitting: *Deleted

## 2021-10-04 VITALS — BP 95/58 | HR 68

## 2021-10-04 DIAGNOSIS — Z4802 Encounter for removal of sutures: Secondary | ICD-10-CM

## 2021-10-04 MED ORDER — FUROSEMIDE 40 MG PO TABS: 40.0000 mg | ORAL_TABLET | Freq: Every day | ORAL | 0 refills | Status: DC

## 2021-10-04 NOTE — Telephone Encounter (Addendum)
Spoke to the patient this morning. He is still not feeling well and having weakness. He noted he was getting ready to head to a visit with [facility] for chest tube suture removal. He is going to speak with them about how he is feeling and get checked out during this visit. Will forward to Dr. Thompson Caul nurse to follow.

## 2021-10-04 NOTE — Telephone Encounter (Signed)
Spoke with pt and he continues to have low BPs with SBPs in the 70s-90s and DBPs in the 50s-60s.  HRs 70s-80s.  When in AF last week he was running 113-133.  Pt feels he has converted.  Pt seen by Dr. Irish Lack on Friday and BNP was drawn.  Pro BNP elevated at 1168.  Dr. Irish Lack initially wanted pt to increase Furosemide to 40mg  BID x 3 days.  Spoke with Dr. Irish Lack about this due to continued issues with hypotension.  He advised to hold Metoprolol for now, try Furosemide 40mg  BID x 3 days, then go back to 40mg  QD and keep upcoming appt.  Call sooner if any issues.  Spoke with pt and his wife and reviewed this information.  Pt verbalized understanding.  Moved appt to 11/29 with Dr. Tamala Julian.  Pt appreciative for call.

## 2021-10-04 NOTE — Telephone Encounter (Signed)
I placed call to patient. He reports he is going into Dr Vivi Martens office and requests we call him back later

## 2021-10-04 NOTE — Progress Notes (Signed)
Patient arrived for nurse visit to remove sutures post-AVR 11/7 by Dr. Cyndia Bent.  Two sutures removed with no signs or symptoms of infection noted.  Incisions well approximated. Patient tolerated suture removal well.  Patient and family instructed to keep the incision site clean and dry. Patient and family acknowledged instructions given.  Patient c/o dizziness and fatigue at home with a BP reading yesterday of 72/52. Patient taking all medication as prescribed. VS at today's visit 95/58, HR 68, Sa O2 97%. Advised patient to stay hydrated through out the day and to rise from sitting to standing slowly. Advised patient to contact cardiologist office to discuss symptoms and low BP. All questions answered.  Patient verbalizes understanding.

## 2021-10-04 NOTE — Telephone Encounter (Signed)
-----   Message from Jettie Booze, MD sent at 10/03/2021  8:26 PM EST ----- Hemoglobin stable, electrolytes stable. Evidence of extra fluid in his system. WOuld double furosemide to 40 mg BID for 3 days and then back to 40 mg daily.

## 2021-10-10 NOTE — Progress Notes (Signed)
Cardiology Office Note:    Date:  10/12/2021   ID:  Jeremy Tucker, DOB 1950-11-29, MRN [id number]  PCP:  Denita Lung, MD  Cardiologist:  [name], MD   Referring MD: Denita Lung, MD   Chief Complaint  Patient presents with   Congestive Heart Failure   Atrial Fibrillation    History of Present Illness:    Jeremy Tucker is a 70 y.o. male with a hx of hypertension, hyperlipidemia, diabetes with peripheral neuropathy, paroxysmal atrial fibrillation status post ablation on Eliquis, OSA intolerant of CPAP, prostate cancer status post radioactive seed implant in April 2022, and aortic stenosis treated with SAVR [name] and LAA occlusion 09/20/2021.   Patient is back in early follow-up after being seen by Dr.Varanasi for acute onset atrial fibrillation 1 week ago.  He was instructed at that time to hold metoprolol because of relatively low blood pressure and start amiodarone 400 mg twice daily with follow-up today.  Amiodarone was never started.  He held metoprolol tartrate.  He also held lisinopril.  He feels tired most of the time.  He does not experience palpitations.  Past Medical History:  Diagnosis Date   Anxiety    BPH (benign prostatic hyperplasia)    BPH without obstruction/lower urinary tract symptoms    Depression    Diabetic peripheral neuropathy ([place]) 01/07/2019   Dyspnea    w/exertion   ED (erectile dysfunction)    Essential hypertension    GERD (gastroesophageal reflux disease)    Hiatal hernia    History of chronic bronchitis    per pt used inhaler as needed   History of DVT of lower extremity 2001   s/p  left TKA completed blood thinner treatment,  per pt no previous clot and none since   History of gastric polyp    per pt benign   History of kidney stones    Hyperlipidemia    Mild CAD cardiologist--- dr h. Tamala Julian   nuclear study 04-14-2017 normal , low risk, nuclear ef 53%;  cardiac cath 09-19-2018  midRCA 30% stenosis otherwise normal  coronaries,  heavy AV calicification with decreased mobility with peak grandiant 4mmHg   Mild dilation of ascending aorta ([place])    per echo 01-13-2021  53mm   Nephrolithiasis    OA (osteoarthritis)    OSA (obstructive sleep apnea)    per pt refused cpap, intolerant;  moderate osa per study in epic 03-08-2015   PAF (paroxysmal atrial fibrillation) ([place])    followed by dr h. 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/2018   Prostate cancer [facility]) urologist--- dr dahlstedt/  oncology-- dr Tammi Klippel   first dx 03/ 2021,  Glesaon 3+4  PSA 6.95   Pulmonary nodule 2019   incidently finding on CTA 09-25-2018, stable   Right carpal tunnel syndrome 01/07/2019   Severe aortic stenosis    Type 2 diabetes mellitus ([place])    followed by pcp   (03-02-2021  pt stated does not check blood sugar at home)   Umbilical hernia     Past Surgical History:  Procedure Laterality Date   AORTIC VALVE REPLACEMENT N/A 09/20/2021   Procedure: AORTIC VALVE REPLACEMENT (AVR) USING 25 MM INSPIRIS RESILIA  AORTIC VALVE;  Surgeon: [name], MD;  Location: [place] [facility];  Service: Open Heart Surgery;  Laterality: N/A;   ATRIAL FIBRILLATION ABLATION N/A 03/12/2015   Procedure: ATRIAL FIBRILLATION  ABLATION;  Surgeon: [name], MD;  Location: [facility] CATH [facility];  Service: Cardiovascular;  Laterality: N/A;   CARDIAC CATHETERIZATION  1990's   "Dr. Melvern Banker", no blockages per pt   CARDIOVERSION N/A 02/25/2015   Procedure: CARDIOVERSION;  Surgeon: Sueanne Margarita, MD;  Location: [place];  Service: Cardiovascular;  Laterality: N/A;   CATARACT EXTRACTION W/ INTRAOCULAR LENS  IMPLANT, BILATERAL  2016   CLIPPING OF ATRIAL APPENDAGE Left 09/20/2021   Procedure: CLIPPING OF ATRIAL APPENDAGE USING ATRICURE 12 MM ATRICLIP;  Surgeon: [name], MD;  Location: [place];  Service: Open Heart Surgery;  Laterality: Left;   COLONOSCOPY  last one 2013  dr Earlean Shawl   CYSTOSCOPY N/A  03/08/2021   Procedure: CYSTOSCOPY BLADDER CALCULI EXTRACTION ;  Surgeon: Franchot Gallo, MD;  Location[facility];  Service: Urology;  Laterality: N/A;   CYSTOSCOPY WITH RETROGRADE PYELOGRAM, URETEROSCOPY AND STENT PLACEMENT Left 04/24/2015   Procedure: URETHRAL MEATAL DILATION, CYSTOSCOPY WITH LEFT RETROGRADE PYELOGRAM, LEFT URETEROSCOPY, STONE BASKET EXTRACTION,  LEFT DOUBLE J STENT PLACEMENT;  Surgeon: Carolan Clines, MD;  Location: [facility];  Service: Urology;  Laterality: Left;   EP IMPLANTABLE DEVICE N/A 09/15/2015   Procedure: Loop Recorder Insertion;  Surgeon: [name], MD;  Location: [place] [facility] LAB;  Service: Cardiovascular;  Laterality: N/A;   ESOPHAGOGASTRODUODENOSCOPY  last one 2017   EXTRACORPOREAL SHOCK WAVE LITHOTRIPSY Bilateral 04/02/2020   Procedure: EXTRACORPOREAL SHOCK WAVE LITHOTRIPSY (ESWL);  Surgeon: Cleon Gustin, MD;  Location: [facility];  Service: Urology;  Laterality: Bilateral;   EXTRACORPOREAL SHOCK WAVE LITHOTRIPSY Left 04/06/2020   Procedure: EXTRACORPOREAL SHOCK WAVE LITHOTRIPSY (ESWL);  Surgeon: [name], MD;  Location: [facility];  Service: Urology;  Laterality: Left;   EYE SURGERY     HOLMIUM LASER APPLICATION Left 81/19/1478   Procedure: HOLMIUM LASER APPLICATION;  Surgeon: Carolan Clines, MD;  Location: [facility];  Service: Urology;  Laterality: Left;   implantable loopr recorder removal  02/22/2021   MDT LINQ removed in office by Dr Allred   JOINT REPLACEMENT     KNEE ARTHROSCOPY Bilateral "multiple times"   LEFT HEART CATH AND CORONARY ANGIOGRAPHY N/A 09/19/2018   Procedure: LEFT HEART CATH AND CORONARY ANGIOGRAPHY;  Surgeon: Belva Crome, MD;  Location: [place] [facility];  Service: Cardiovascular;  Laterality: N/A;   PERCUTANEOUS NEPHROSTOLITHOTOMY  1980s and 1990s   RADIOACTIVE SEED IMPLANT N/A 03/08/2021   Procedure: RADIOACTIVE SEED IMPLANT/BRACHYTHERAPY IMPLANT;   Surgeon: Franchot Gallo, MD;  Location: [facility];  Service: Urology;  Laterality: N/A;  90 MINS   RIGHT/LEFT HEART CATH AND CORONARY ANGIOGRAPHY N/A 08/04/2021   Procedure: RIGHT/LEFT HEART CATH AND CORONARY ANGIOGRAPHY;  Surgeon: Belva Crome, MD;  Location: [place] [facility];  Service: Cardiovascular;  Laterality: N/A;   SKIN BIOPSY Left 05/06/2020   Shave biopsy left posterior hand neurofiberoma.   SPACE OAR INSTILLATION N/A 03/08/2021   Procedure: SPACE OAR INSTILLATION;  Surgeon: Franchot Gallo, MD;  Location: [facility];  Service: Urology;  Laterality: N/A;   TEE WITHOUT CARDIOVERSION N/A 03/12/2015   Procedure: TRANSESOPHAGEAL ECHOCARDIOGRAM (TEE);  Surgeon: Pixie Casino, MD;  Location: [place];  Service: Cardiovascular;  Laterality: N/A;   TEE WITHOUT CARDIOVERSION N/A 09/20/2021   Procedure: TRANSESOPHAGEAL ECHOCARDIOGRAM (TEE);  Surgeon: [name], MD;  Location: [place];  Service: Open Heart Surgery;  Laterality: N/A;   TONSILLECTOMY  age 29   TOTAL KNEE ARTHROPLASTY Left 11-22-1999  @  [place]   TOTAL KNEE ARTHROPLASTY Right 10/30/2017   Procedure: RIGHT TOTAL KNEE ARTHROPLASTY;  Surgeon: Paralee Cancel, MD;  Location[facility];  Service: Orthopedics;  Laterality: Right;  90 mins   UMBILICAL HERNIA REPAIR N/A 07/05/2021   Procedure: REPAIR INCARCERATED UMBILICAL HERNIA WITH MESH;  Surgeon: Georganna Skeans, MD;  Location: [place];  Service: General;  Laterality: N/A;    Current Medications: Current Meds  Medication Sig   acetaminophen (TYLENOL) 500 MG tablet Take 1,000 mg by mouth every 8 (eight) hours as needed for mild pain.    albuterol (VENTOLIN HFA) 108 (90 Base) MCG/ACT inhaler INHALE 2 PUFFS INTO THE LUNGS EVERY 6 HOURS AS NEEDED FOR WHEEZING OR SHORTNESS OF BREATH   allopurinol (ZYLOPRIM) 300 MG tablet Take 1 tablet (300 mg total) by mouth daily.   amiodarone (PACERONE) 400 MG tablet Take 1 tablet (400 mg total) by mouth 2 (two)  times daily.   ammonium lactate (LAC-HYDRIN) 12 % lotion Apply 1 application topically daily as needed for dry skin.   aspirin EC 81 MG tablet Take 1 tablet (81 mg total) by mouth daily. Swallow whole.   atorvastatin (LIPITOR) 40 MG tablet Take 1 tablet (40 mg total) by mouth at bedtime.   buPROPion (WELLBUTRIN XL) 300 MG 24 hr tablet Take 1 tablet (300 mg total) by mouth daily. (Patient taking differently: Take 150 mg by mouth in the morning.)   ELIQUIS 5 MG TABS tablet TAKE ONE TABLET BY MOUTH TWICE A DAY   fluticasone (FLONASE) 50 MCG/ACT nasal spray Place 2 sprays into both nostrils daily.   furosemide (LASIX) 40 MG tablet Take 1 tablet (40 mg total) by mouth daily.   HYDROcodone-acetaminophen (NORCO/VICODIN) 5-325 MG tablet Take 1 tablet by mouth every 4 (four) hours as needed for moderate pain.   loratadine (CLARITIN) 10 MG tablet Take 10 mg by mouth in the morning.   LORazepam (ATIVAN) 0.5 MG tablet TAKE 1 TABLET TWICE DAILY AS NEEDED FOR ANXIETY.   metFORMIN (GLUCOPHAGE) 500 MG tablet Take 1 tablet (500 mg total) by mouth 2 (two) times daily with a meal.   metoprolol tartrate (LOPRESSOR) 25 MG tablet Take 1 tablet (25 mg total) by mouth 2 (two) times daily.   Multiple Vitamins-Minerals (MULTIVITAMIN WITH MINERALS) tablet Take 1 tablet by mouth daily.   olopatadine (PATANOL) 0.1 % ophthalmic solution Place 1 drop into both eyes daily as needed for allergies (tearing).    Omega-3 Fatty Acids (FISH OIL) 1000 MG CAPS Take 1,000 mg by mouth daily.   omeprazole (PRILOSEC) 20 MG capsule Take 20 mg by mouth daily.   potassium chloride SA (KLOR-CON) 20 MEQ tablet Take 1 tablet (20 mEq total) by mouth daily.   sertraline (ZOLOFT) 50 MG tablet Take 1 tablet (50 mg total) by mouth daily.   sildenafil (VIAGRA) 100 MG tablet Take 1 tablet (100 mg total) by mouth daily as needed for erectile dysfunction.   tamsulosin (FLOMAX) 0.4 MG CAPS capsule Take 0.4 mg by mouth at bedtime.   zolpidem (AMBIEN) 5 MG  tablet Take 1 tablet (5 mg total) by mouth at bedtime as needed for sleep.     Allergies:   Oxycodone   Social History   Socioeconomic History   Marital status: Married    Spouse name: Not on file   Number of children: 1   Years of education: Not on file   Highest education level: Some college, no degree  Occupational History   Occupation: Retired  Tobacco Use  Smoking status: Never   Smokeless tobacco: Never  Vaping Use   Vaping Use: Never used  Substance and Sexual Activity   Alcohol use: Yes    Comment: very rare   Drug use: Never   Sexual activity: Yes  Other Topics Concern   Not on file  Social History Narrative   Pt lives in [place] with spouse.  60 yo son is health.  Second son died in car accident.      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 There is no history of Breast cancer, Colon cancer, Pancreatic cancer, or Prostate cancer.  ROS:   Please see the history of present illness.    Changes.  Not able to sleep well.  Not short of breath however.  All other systems reviewed and are negative.  EKGs/Labs/Other Studies Reviewed:    The following studies were reviewed today: No new imaging  EKG:  EKG atrial fibrillation with RVR at rate of 114 bpm.  Nonspecific ST-T wave change.  Recent Labs: 07/27/2021: TSH 2.280 09/16/2021: ALT 38 09/21/2021: Magnesium 2.1 10/01/2021: BUN 19; Creatinine, Ser 0.95; Hemoglobin 12.9; NT-Pro BNP 1,168; Platelets 444; Potassium 4.9; Sodium 141  Recent  Lipid Panel    Component Value Date/Time   CHOL 137 04/01/2021 1002   TRIG 166 (H) 04/01/2021 1002   HDL 43 04/01/2021 1002   CHOLHDL 3.2 04/01/2021 1002   CHOLHDL 3.6 08/16/2016 0925   VLDL 28 08/16/2016 0925   LDLCALC 66 04/01/2021 1002    Physical Exam:    VS:  BP 112/70   Pulse (!) 114   Ht 6' (1.829 m)   Wt 225 lb 6.4 oz (102.2 kg)   SpO2 96%   BMI 30.57 kg/m     Wt Readings from Last 3 Encounters:  10/12/21 225 lb 6.4 oz (102.2 kg)  10/01/21 223 lb 12.8 oz (101.5 kg)  09/24/21 242 lb 8.1 oz (110 kg)     GEN: Not short of breath. No acute distress HEENT: Normal NECK: No JVD. LYMPHATICS: No lymphadenopathy CARDIAC: No murmur.  Rapid irregularly irregular RR no gallop, or edema. VASCULAR:  Normal Pulses. No bruits. RESPIRATORY:  Clear to auscultation without rales, wheezing or rhonchi  ABDOMEN: Soft, non-tender, non-distended, No pulsatile mass, MUSCULOSKELETAL: No deformity  SKIN: Warm and dry NEUROLOGIC:  Alert and oriented x 3 PSYCHIATRIC:  Normal affect   ASSESSMENT:    1. S/P AVR   2. Paroxysmal atrial fibrillation (HCC)   3. Ascending aorta dilation (HCC)   4. Acquired thrombophilia ([place])   5. Essential hypertension    PLAN:    In order of problems listed above:  By auscultation valve function is normal. He is status post ablation for prior atrial fibrillation and also required cardioversion.  He did not start amiodarone as instructed because he wanted to wait to see what my thoughts were.  He needs to be on amiodarone to hopefully achieve pharmacologic conversion.  If this does not happen within  the next 2 to 4 weeks he will need cardioversion and an extended period of time on amiodarone at 200 mg/day.  Current instruction is 400 mg twice a day for a week then 400 mg daily for 2 weeks, then followed by 200 mg daily.  To get better rate control and with higher blood pressures, we have decided to start metoprolol tartrate 25 mg twice daily.  They will  measure the blood pressure twice per day.  They will notify us if blood pressures tend to be less than 675 mmHg systolic.  Return in 2 weeks or less for EKG and follow-up office visit and to begin making plans for cardioversion if he continues in atrial fib. Not discussed Continue aspirin and apixaban. Watch blood pressure.  ACE inhibitor has been discontinued.  Metoprolol is being resumed.  Monitor blood pressures closely   Medication Adjustments/Labs and Tests Ordered: Current medicines are reviewed at length with the patient today.  Concerns regarding medicines are outlined above.  Orders Placed This Encounter  Procedures   EKG 12-Lead   No orders of the defined types were placed in this encounter.   Patient Instructions  Medication Instructions:  1) START Amiodarone 400mg  twice daily for 1 week, then decrease to 200mg  twice daily  2) RESTART Metoprolol Tartrate 25mg  twice daily.  Monitor your blood pressure daily and call the office if your top number is consistently under 100.  *If you need a refill on your cardiac medications before your next appointment, please call your pharmacy*   Lab Work: None  If you have labs (blood work) drawn today and your tests are completely normal, you will receive your results only by: [place] (if you have MyChart) OR A paper copy in the mail If you have any lab test that is abnormal or we need to change your treatment, we will call you to review the results.   Testing/Procedures: None   Follow-Up: At [facility], you and your health needs are our priority.  As part of our continuing mission to provide you with exceptional heart care, we have created designated Provider Care Teams.  These Care Teams include your primary Cardiologist (physician) and Advanced Practice Providers (APPs -  Physician Assistants and Nurse Practitioners) who all work together to provide you with the care you need, when you need it.  We recommend signing  up for the patient portal called "MyChart".  Sign up information is provided on this After Visit Summary.  MyChart is used to connect with patients for Virtual Visits (Telemedicine).  Patients are able to view lab/test results, encounter notes, upcoming appointments, etc.  Non-urgent messages can be sent to your provider as well.   To learn more about what you can do with MyChart, go to [email or web address].    Your next appointment:   10 day(s)  The format for your next appointment:   In Person  Provider:   [name], MD     Other Instructions     Signed, [name], MD  10/12/2021 11:20 AM    [place]

## 2021-10-11 LAB — ECHO INTRAOPERATIVE TEE
AR max vel: 1.38 cm2
AV Area VTI: 0.95 cm2
AV Area mean vel: 1.24 cm2
AV Mean grad: 49 mmHg
AV Peak grad: 41.5 mmHg
Ao pk vel: 3.22 m/s
Height: 72 in
Weight: 3696 oz

## 2021-10-12 ENCOUNTER — Ambulatory Visit: Payer: PPO | Admitting: Interventional Cardiology

## 2021-10-12 ENCOUNTER — Other Ambulatory Visit: Payer: Self-pay

## 2021-10-12 ENCOUNTER — Encounter: Payer: Self-pay | Admitting: Interventional Cardiology

## 2021-10-12 VITALS — BP 112/70 | HR 114 | Ht 72.0 in | Wt 225.4 lb

## 2021-10-12 DIAGNOSIS — Z952 Presence of prosthetic heart valve: Secondary | ICD-10-CM

## 2021-10-12 DIAGNOSIS — I10 Essential (primary) hypertension: Secondary | ICD-10-CM

## 2021-10-12 DIAGNOSIS — I77810 Thoracic aortic ectasia: Secondary | ICD-10-CM | POA: Diagnosis not present

## 2021-10-12 DIAGNOSIS — I480 Paroxysmal atrial fibrillation: Secondary | ICD-10-CM

## 2021-10-12 DIAGNOSIS — D6869 Other thrombophilia: Secondary | ICD-10-CM | POA: Diagnosis not present

## 2021-10-12 NOTE — Patient Instructions (Signed)
Medication Instructions:  1) START Amiodarone 400mg  twice daily for 1 week, then decrease to 200mg  twice daily  2) RESTART Metoprolol Tartrate 25mg  twice daily.  Monitor your blood pressure daily and call the office if your top number is consistently under 100.  *If you need a refill on your cardiac medications before your next appointment, please call your pharmacy*   Lab Work: None  If you have labs (blood work) drawn today and your tests are completely normal, you will receive your results only by: [facility] (if you have MyChart) OR A paper copy in the mail If you have any lab test that is abnormal or we need to change your treatment, we will call you to review the results.   Testing/Procedures: None   Follow-Up: At [facility], you and your health needs are our priority.  As part of our continuing mission to provide you with exceptional heart care, we have created designated Provider Care Teams.  These Care Teams include your primary Cardiologist (physician) and Advanced Practice Providers (APPs -  Physician Assistants and Nurse Practitioners) who all work together to provide you with the care you need, when you need it.  We recommend signing up for the patient portal called "MyChart".  Sign up information is provided on this After Visit Summary.  MyChart is used to connect with patients for Virtual Visits (Telemedicine).  Patients are able to view lab/test results, encounter notes, upcoming appointments, etc.  Non-urgent messages can be sent to your provider as well.   To learn more about what you can do with MyChart, go to [email or web address].    Your next appointment:   10 day(s)  The format for your next appointment:   In Person  Provider:   [name], MD     Other Instructions

## 2021-10-13 ENCOUNTER — Ambulatory Visit: Payer: PPO | Admitting: Physician Assistant

## 2021-10-13 DIAGNOSIS — E785 Hyperlipidemia, unspecified: Secondary | ICD-10-CM

## 2021-10-13 DIAGNOSIS — I350 Nonrheumatic aortic (valve) stenosis: Secondary | ICD-10-CM

## 2021-10-13 DIAGNOSIS — I480 Paroxysmal atrial fibrillation: Secondary | ICD-10-CM

## 2021-10-13 DIAGNOSIS — I10 Essential (primary) hypertension: Secondary | ICD-10-CM

## 2021-10-13 DIAGNOSIS — E118 Type 2 diabetes mellitus with unspecified complications: Secondary | ICD-10-CM

## 2021-10-13 DIAGNOSIS — G4733 Obstructive sleep apnea (adult) (pediatric): Secondary | ICD-10-CM

## 2021-10-13 DIAGNOSIS — I2510 Atherosclerotic heart disease of native coronary artery without angina pectoris: Secondary | ICD-10-CM

## 2021-10-14 ENCOUNTER — Telehealth: Payer: Self-pay | Admitting: Family Medicine

## 2021-10-14 NOTE — Telephone Encounter (Signed)
Jeremy Tucker left a message on my voice mail from Tuesday that he wants to speak with you.  Please call Jeremy Tucker.

## 2021-10-18 DIAGNOSIS — K219 Gastro-esophageal reflux disease without esophagitis: Secondary | ICD-10-CM | POA: Diagnosis not present

## 2021-10-19 ENCOUNTER — Telehealth: Payer: Self-pay | Admitting: Family Medicine

## 2021-10-19 NOTE — Telephone Encounter (Signed)
Pt came in and dropped off [employer]plant[employer] [place] ID card. Sending back to [place] and then will send to scan center.

## 2021-10-21 DIAGNOSIS — R350 Frequency of micturition: Secondary | ICD-10-CM | POA: Diagnosis not present

## 2021-10-21 NOTE — Progress Notes (Signed)
Cardiology Office Note:    Date:  10/22/2021   ID:  Jeremy Tucker, DOB 1951/05/22, MRN [id number]  PCP:  Denita Lung, MD  Cardiologist:  [name], MD   Referring MD: Denita Lung, MD   Chief Complaint  Patient presents with   Atrial Fibrillation    History of Present Illness:    Jeremy Tucker is a 70 y.o. male with a hx of hypertension, hyperlipidemia, diabetes with peripheral neuropathy, paroxysmal atrial fibrillation status post ablation on Eliquis, OSA intolerant of CPAP, prostate cancer status post radioactive seed implant in April 2022, and aortic stenosis treated with SAVR [name] and LAA occlusion 09/20/2021.  Feels much better.  Apparently had spontaneous conversion/pharmacologic conversion on amiodarone.  He began feeling much better.  He is now walking.  He has no shortness of breath.  Did not have syncope or any other complaints.  Past Medical History:  Diagnosis Date   Anxiety    BPH (benign prostatic hyperplasia)    BPH without obstruction/lower urinary tract symptoms    Depression    Diabetic peripheral neuropathy ([place]) 01/07/2019   Dyspnea    w/exertion   ED (erectile dysfunction)    Essential hypertension    GERD (gastroesophageal reflux disease)    Hiatal hernia    History of chronic bronchitis    per pt used inhaler as needed   History of DVT of lower extremity 2001   s/p  left TKA completed blood thinner treatment,  per pt no previous clot and none since   History of gastric polyp    per pt benign   History of kidney stones    Hyperlipidemia    Mild CAD cardiologist--- dr h. Tamala Julian   nuclear study 04-14-2017 normal , low risk, nuclear ef 53%;  cardiac cath 09-19-2018  midRCA 30% stenosis otherwise normal coronaries,  heavy AV calicification with decreased mobility with peak grandiant 30mmHg   Mild dilation of ascending aorta ([place])    per echo 01-13-2021  31mm   Nephrolithiasis    OA (osteoarthritis)    OSA (obstructive sleep  apnea)    per pt refused cpap, intolerant;  moderate osa per study in epic 03-08-2015   PAF (paroxysmal atrial fibrillation) ([place])    followed by dr h. 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/2018   Prostate cancer [facility]) urologist--- dr dahlstedt/  oncology-- dr Tammi Klippel   first dx 03/ 2021,  G[place]on 3+4  PSA 6.95   Pulmonary nodule 2019   incidently finding on CTA 09-25-2018, stable   Right carpal tunnel syndrome 01/07/2019   Severe aortic stenosis    Type 2 diabetes mellitus ([place])    followed by pcp   (03-02-2021  pt stated does not check blood sugar at home)   Umbilical hernia     Past Surgical History:  Procedure Laterality Date   AORTIC VALVE REPLACEMENT N/A 09/20/2021   Procedure: AORTIC VALVE REPLACEMENT (AVR) USING 25 MM INSPIRIS RESILIA  AORTIC VALVE;  Surgeon: [name], MD;  Location: [facility];  Service: Open Heart Surgery;  Laterality: N/A;   ATRIAL FIBRILLATION ABLATION N/A 03/12/2015   Procedure: ATRIAL FIBRILLATION ABLATION;  Surgeon: [name], MD;  Location: [facility] CATH LAB;  Service: Cardiovascular;  Laterality: N/A;   CARDIAC CATHETERIZATION  1990's   "Dr. Melvern Banker", no blockages per pt   CARDIOVERSION N/A 02/25/2015   Procedure: CARDIOVERSION;  Surgeon:  Sueanne Margarita, MD;  Location: [place];  Service: Cardiovascular;  Laterality: N/A;   CATARACT EXTRACTION W/ INTRAOCULAR LENS  IMPLANT, BILATERAL  2016   CLIPPING OF ATRIAL APPENDAGE Left 09/20/2021   Procedure: CLIPPING OF ATRIAL APPENDAGE USING ATRICURE 10 MM ATRICLIP;  Surgeon: [name], MD;  Location: [place];  Service: Open Heart Surgery;  Laterality: Left;   COLONOSCOPY  last one 2013  dr Earlean Shawl   CYSTOSCOPY N/A 03/08/2021   Procedure: CYSTOSCOPY BLADDER CALCULI EXTRACTION ;  Surgeon: Franchot Gallo, MD;  Location: [facility];  Service: Urology;  Laterality: N/A;   CYSTOSCOPY WITH RETROGRADE PYELOGRAM, URETEROSCOPY AND  STENT PLACEMENT Left 04/24/2015   Procedure: URETHRAL MEATAL DILATION, CYSTOSCOPY WITH LEFT RETROGRADE PYELOGRAM, LEFT URETEROSCOPY, STONE BASKET EXTRACTION,  LEFT DOUBLE J STENT PLACEMENT;  Surgeon: Carolan Clines, MD;  Location[facility];  Service: Urology;  Laterality: Left;   EP IMPLANTABLE DEVICE N/A 09/15/2015   Procedure: Loop Recorder Insertion;  Surgeon: [name], MD;  Location: [place] CV LAB;  Service: Cardiovascular;  Laterality: N/A;   ESOPHAGOGASTRODUODENOSCOPY  last one 2017   EXTRACORPOREAL SHOCK WAVE LITHOTRIPSY Bilateral 04/02/2020   Procedure: EXTRACORPOREAL SHOCK WAVE LITHOTRIPSY (ESWL);  Surgeon: Cleon Gustin, MD;  Location: [facility];  Service: Urology;  Laterality: Bilateral;   EXTRACORPOREAL SHOCK WAVE LITHOTRIPSY Left 04/06/2020   Procedure: EXTRACORPOREAL SHOCK WAVE LITHOTRIPSY (ESWL);  Surgeon: [name], MD;  [facility];  Service: Urology;  Laterality: Left;   EYE SURGERY     HOLMIUM LASER APPLICATION Left 16/08/9603   Procedure: HOLMIUM LASER APPLICATION;  Surgeon: Carolan Clines, MD;  [facility];  Service: Urology;  Laterality: Left;   implantable loopr recorder removal  02/22/2021   MDT LINQ removed in office by Dr Allred   JOINT REPLACEMENT     KNEE ARTHROSCOPY Bilateral "multiple times"   LEFT HEART CATH AND CORONARY ANGIOGRAPHY N/A 09/19/2018   Procedure: LEFT HEART CATH AND CORONARY ANGIOGRAPHY;  Surgeon: Belva Crome, MD;  Location: [place] CV LAB;  Service: Cardiovascular;  Laterality: N/A;   PERCUTANEOUS NEPHROSTOLITHOTOMY  1980s and 1990s   RADIOACTIVE SEED IMPLANT N/A 03/08/2021   Procedure: RADIOACTIVE SEED IMPLANT/BRACHYTHERAPY IMPLANT;  Surgeon: Franchot Gallo, MD;  Location: [facility];  Service: Urology;  Laterality: N/A;  90 MINS   RIGHT/LEFT HEART CATH AND CORONARY ANGIOGRAPHY N/A 08/04/2021   Procedure: RIGHT/LEFT HEART CATH AND CORONARY  ANGIOGRAPHY;  Surgeon: Belva Crome, MD;  Location: [place] CV LAB;  Service: Cardiovascular;  Laterality: N/A;   SKIN BIOPSY Left 05/06/2020   Shave biopsy left posterior hand neurofiberoma.   SPACE OAR INSTILLATION N/A 03/08/2021   Procedure: SPACE OAR INSTILLATION;  Surgeon: Franchot Gallo, MD;  Location: [facility];  Service: Urology;  Laterality: N/A;   TEE WITHOUT CARDIOVERSION N/A 03/12/2015   Procedure: TRANSESOPHAGEAL ECHOCARDIOGRAM (TEE);  Surgeon: Pixie Casino, MD;  Location: [place];  Service: Cardiovascular;  Laterality: N/A;   TEE WITHOUT CARDIOVERSION N/A 09/20/2021   Procedure: TRANSESOPHAGEAL ECHOCARDIOGRAM (TEE);  Surgeon: [name], MD;  Location: [place];  Service: Open Heart Surgery;  Laterality: N/A;   TONSILLECTOMY  age 3   TOTAL KNEE ARTHROPLASTY Left 11-22-1999  @[facility]    TOTAL KNEE ARTHROPLASTY Right 10/30/2017   Procedure: RIGHT TOTAL KNEE ARTHROPLASTY;  Surgeon: Paralee Cancel, MD;  [facility];  Service: Orthopedics;  Laterality: Right;  90 mins   UMBILICAL HERNIA REPAIR N/A 07/05/2021  Procedure: REPAIR INCARCERATED UMBILICAL HERNIA WITH MESH;  Surgeon: Georganna Skeans, MD;  Location: [place];  Service: General;  Laterality: N/A;    Current Medications: Current Meds  Medication Sig   acetaminophen (TYLENOL) 500 MG tablet Take 1,000 mg by mouth every 8 (eight) hours as needed for mild pain.    albuterol (VENTOLIN HFA) 108 (90 Base) MCG/ACT inhaler INHALE 2 PUFFS INTO THE LUNGS EVERY 6 HOURS AS NEEDED FOR WHEEZING OR SHORTNESS OF BREATH   allopurinol (ZYLOPRIM) 300 MG tablet Take 1 tablet (300 mg total) by mouth daily.   ammonium lactate (LAC-HYDRIN) 12 % lotion Apply 1 application topically daily as needed for dry skin.   aspirin EC 81 MG tablet Take 1 tablet (81 mg total) by mouth daily. Swallow whole.   atorvastatin (LIPITOR) 40 MG tablet Take 1 tablet (40 mg total) by mouth at bedtime.   buPROPion (WELLBUTRIN XL) 300 MG 24  hr tablet Take 1 tablet (300 mg total) by mouth daily. (Patient taking differently: Take 150 mg by mouth in the morning.)   ELIQUIS 5 MG TABS tablet TAKE ONE TABLET BY MOUTH TWICE A DAY   fluticasone (FLONASE) 50 MCG/ACT nasal spray Place 2 sprays into both nostrils daily.   furosemide (LASIX) 40 MG tablet Take 1 tablet (40 mg total) by mouth daily.   HYDROcodone-acetaminophen (NORCO/VICODIN) 5-325 MG tablet Take 1 tablet by mouth every 4 (four) hours as needed for moderate pain.   loratadine (CLARITIN) 10 MG tablet Take 10 mg by mouth in the morning.   LORazepam (ATIVAN) 0.5 MG tablet TAKE 1 TABLET TWICE DAILY AS NEEDED FOR ANXIETY.   metFORMIN (GLUCOPHAGE) 500 MG tablet Take 1 tablet (500 mg total) by mouth 2 (two) times daily with a meal.   metoprolol tartrate (LOPRESSOR) 25 MG tablet Take 1 tablet (25 mg total) by mouth 2 (two) times daily.   Multiple Vitamins-Minerals (MULTIVITAMIN WITH MINERALS) tablet Take 1 tablet by mouth daily.   olopatadine (PATANOL) 0.1 % ophthalmic solution Place 1 drop into both eyes daily as needed for allergies (tearing).    Omega-3 Fatty Acids (FISH OIL) 1000 MG CAPS Take 1,000 mg by mouth daily.   omeprazole (PRILOSEC) 20 MG capsule Take 20 mg by mouth daily.   sertraline (ZOLOFT) 50 MG tablet Take 1 tablet (50 mg total) by mouth daily.   sildenafil (VIAGRA) 100 MG tablet Take 1 tablet (100 mg total) by mouth daily as needed for erectile dysfunction.   tamsulosin (FLOMAX) 0.4 MG CAPS capsule Take 0.4 mg by mouth at bedtime.   zolpidem (AMBIEN) 5 MG tablet Take 1 tablet (5 mg total) by mouth at bedtime as needed for sleep.   [DISCONTINUED] amiodarone (PACERONE) 400 MG tablet Take 1 tablet (400 mg total) by mouth 2 (two) times daily.   [DISCONTINUED] potassium chloride SA (KLOR-CON) 20 MEQ tablet Take 1 tablet (20 mEq total) by mouth daily.     Allergies:   Oxycodone   Social History   Socioeconomic History   Marital status: Married    Spouse name: Not on  file   Number of children: 1   Years of education: Not on file   Highest education level: Some college, no degree  Occupational History   Occupation: Retired  Tobacco Use   Smoking status: Never   Smokeless tobacco: Never  Vaping Use   Vaping Use: Never used  Substance and Sexual Activity   Alcohol use: Yes    Comment: very rare   Drug use: Never  Sexual activity: Yes  Other Topics Concern   Not on file  Social History Narrative   Pt lives in [place] with spouse.  6 yo son is health.  Second son died in car accident.      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 There is no history of Breast cancer, Colon cancer, Pancreatic cancer, or Prostate cancer.  ROS:   Please see the history of present illness.    Denies orthopnea.  Planning a cruise to [place] and [place].  All other systems reviewed and are negative.  EKGs/Labs/Other Studies Reviewed:    The following studies were reviewed today: No new data  EKG:  EKG sinus rhythm, with normal PR interval.  Nonspecific T wave flattening.  Recent Labs: 07/27/2021: TSH 2.280 09/16/2021: ALT 38 09/21/2021: Magnesium 2.1 10/01/2021: BUN 19; Creatinine, Ser 0.95; Hemoglobin 12.9; NT-Pro BNP 1,168; Platelets 444; Potassium 4.9; Sodium 141  Recent Lipid Panel    Component Value Date/Time   CHOL 137 04/01/2021 1002   TRIG 166 (H) 04/01/2021 1002   HDL 43 04/01/2021 1002   CHOLHDL 3.2 04/01/2021 1002   CHOLHDL 3.6 08/16/2016 0925   VLDL 28  08/16/2016 0925   LDLCALC 66 04/01/2021 1002    Physical Exam:    VS:  BP 110/70   Pulse 63   Ht 6' (1.829 m)   Wt 224 lb 12.8 oz (102 kg)   SpO2 98%   BMI 30.49 kg/m     Wt Readings from Last 3 Encounters:  10/22/21 224 lb 12.8 oz (102 kg)  10/12/21 225 lb 6.4 oz (102.2 kg)  10/01/21 223 lb 12.8 oz (101.5 kg)     GEN: Stable and compatible with age. No acute distress HEENT: Normal NECK: No JVD. LYMPHATICS: No lymphadenopathy CARDIAC: No murmur. RRR no gallop, or edema. VASCULAR:  Normal Pulses. No bruits. RESPIRATORY:  Clear to auscultation without rales, wheezing or rhonchi  ABDOMEN: Soft, non-tender, non-distended, No pulsatile mass, MUSCULOSKELETAL: No deformity  SKIN: Warm and dry NEUROLOGIC:  Alert and oriented x 3 PSYCHIATRIC:  Normal affect   ASSESSMENT:    1. Paroxysmal atrial fibrillation (HCC)   2. S/P AVR   3. Ascending aorta dilation (HCC)   4. Essential hypertension   5. OSA (obstructive sleep apnea)   6. Chronic anticoagulation    PLAN:    In order of problems listed above:  Resolved.  Decrease amiodarone to 200 mg twice daily for 2 weeks.  After 2 weeks decrease amiodarone to 200 mg/day.  Discontinue furosemide and K. Dur.  Basic metabolic panel in 7 to 14 days. Auscultation does not demonstrate aortic regurgitation. Did not discuss Monitor blood pressure and weights off diuretic.  Notify us if blood pressures run above 140/90 mmHg.  Will resume lisinopril once his blood pressure starts to rise off diuretic therapy. Continue CPAP Continue Eliquis.  Liberalize physical activity.  Okay to go on a cruise to [place].   Medication Adjustments/Labs and Tests  Ordered: Current medicines are reviewed at length with the patient today.  Concerns regarding medicines are outlined above.  Orders Placed This Encounter  Procedures   EKG 12-Lead   No orders of the defined types were placed in this encounter.   There are no Patient Instructions  on file for this visit.   Signed, [name], MD  10/22/2021 12:17 PM    [place]

## 2021-10-22 ENCOUNTER — Other Ambulatory Visit: Payer: Self-pay

## 2021-10-22 ENCOUNTER — Encounter: Payer: Self-pay | Admitting: Interventional Cardiology

## 2021-10-22 ENCOUNTER — Ambulatory Visit: Payer: PPO | Admitting: Interventional Cardiology

## 2021-10-22 VITALS — BP 110/70 | HR 63 | Ht 72.0 in | Wt 224.8 lb

## 2021-10-22 DIAGNOSIS — I480 Paroxysmal atrial fibrillation: Secondary | ICD-10-CM | POA: Diagnosis not present

## 2021-10-22 DIAGNOSIS — I10 Essential (primary) hypertension: Secondary | ICD-10-CM | POA: Diagnosis not present

## 2021-10-22 DIAGNOSIS — G4733 Obstructive sleep apnea (adult) (pediatric): Secondary | ICD-10-CM

## 2021-10-22 DIAGNOSIS — Z7901 Long term (current) use of anticoagulants: Secondary | ICD-10-CM

## 2021-10-22 DIAGNOSIS — Z952 Presence of prosthetic heart valve: Secondary | ICD-10-CM

## 2021-10-22 DIAGNOSIS — I77810 Thoracic aortic ectasia: Secondary | ICD-10-CM | POA: Diagnosis not present

## 2021-10-22 MED ORDER — AMIODARONE HCL 200 MG PO TABS: 200.0000 mg | ORAL_TABLET | Freq: Every day | ORAL | 3 refills | Status: DC

## 2021-10-22 NOTE — Addendum Note (Signed)
Addended by: Loren Racer on: 10/22/2021 12:30 PM   Modules accepted: Orders

## 2021-10-22 NOTE — Patient Instructions (Signed)
Medication Instructions:  1) DISCONTINUE Furosemide 2) DISCONTINUE Potassium 3) DECREASE Amiodarone to 200mg  twice daily for 2 weeks, then take 200mg  once daily  *If you need a refill on your cardiac medications before your next appointment, please call your pharmacy*   Lab Work: BMET in 2 weeks  If you have labs (blood work) drawn today and your tests are completely normal, you will receive your results only by: [place] (if you have MyChart) OR A paper copy in the mail If you have any lab test that is abnormal or we need to change your treatment, we will call you to review the results.   Testing/Procedures: None   Follow-Up: At [facility], you and your health needs are our priority.  As part of our continuing mission to provide you with exceptional heart care, we have created designated Provider Care Teams.  These Care Teams include your primary Cardiologist (physician) and Advanced Practice Providers (APPs -  Physician Assistants and Nurse Practitioners) who all work together to provide you with the care you need, when you need it.  We recommend signing up for the patient portal called "MyChart".  Sign up information is provided on this After Visit Summary.  MyChart is used to connect with patients for Virtual Visits (Telemedicine).  Patients are able to view lab/test results, encounter notes, upcoming appointments, etc.  Non-urgent messages can be sent to your provider as well.   To learn more about what you can do with MyChart, go to [email or web address].    Your next appointment:   9-10 week(s)  The format for your next appointment:   In Person  Provider:   [name], MD     Other Instructions

## 2021-10-22 NOTE — Addendum Note (Signed)
Addended by: Loren Racer on: 10/22/2021 12:27 PM   Modules accepted: Orders

## 2021-10-25 ENCOUNTER — Telehealth: Payer: Self-pay

## 2021-10-25 MED ORDER — TRAZODONE HCL 50 MG PO TABS: 25.0000 mg | ORAL_TABLET | Freq: Every evening | ORAL | 0 refills | Status: DC | PRN

## 2021-10-25 NOTE — Addendum Note (Signed)
Addended by: Denita Lung on: 10/25/2021 01:09 PM   Modules accepted: Orders

## 2021-10-25 NOTE — Telephone Encounter (Signed)
He is again having difficulty with sleep.  He found that 5 mg of Ambien was not working and switched to 10 and was successful with that.  I explained that I did not want him on this long-term and we will try him on Vistaril to see if this will also help with his sleep.

## 2021-10-25 NOTE — Telephone Encounter (Signed)
[phone]  Patient called wanting Dr.Lalonde to call him.  I asked what this is regarding he states Dr.Lalonde knows details and wants to talk to him only about his open heart surgery if possible, advised would send him a message

## 2021-10-27 ENCOUNTER — Ambulatory Visit: Payer: PPO | Admitting: Surgery

## 2021-10-30 ENCOUNTER — Other Ambulatory Visit: Payer: Self-pay | Admitting: Interventional Cardiology

## 2021-11-02 ENCOUNTER — Other Ambulatory Visit: Payer: PPO

## 2021-11-02 ENCOUNTER — Ambulatory Visit (HOSPITAL_COMMUNITY): Payer: PPO | Attending: Cardiology

## 2021-11-02 ENCOUNTER — Other Ambulatory Visit: Payer: Self-pay

## 2021-11-02 DIAGNOSIS — I480 Paroxysmal atrial fibrillation: Secondary | ICD-10-CM

## 2021-11-02 DIAGNOSIS — I350 Nonrheumatic aortic (valve) stenosis: Secondary | ICD-10-CM

## 2021-11-02 LAB — ECHOCARDIOGRAM COMPLETE
AR max vel: 1.99 cm2
AV Area VTI: 2.1 cm2
AV Area mean vel: 2.06 cm2
AV Mean grad: 7.6 mmHg
AV Peak grad: 15.9 mmHg
Ao pk vel: 1.99 m/s
Area-P 1/2: 3.58 cm2
S' Lateral: 3.1 cm

## 2021-11-03 ENCOUNTER — Other Ambulatory Visit: Payer: Self-pay | Admitting: Family Medicine

## 2021-11-03 DIAGNOSIS — J4531 Mild persistent asthma with (acute) exacerbation: Secondary | ICD-10-CM

## 2021-11-03 LAB — BASIC METABOLIC PANEL
BUN/Creatinine Ratio: 14 (ref 10–24)
BUN: 12 mg/dL (ref 8–27)
CO2: 27 mmol/L (ref 20–29)
Calcium: 9 mg/dL (ref 8.6–10.2)
Chloride: 100 mmol/L (ref 96–106)
Creatinine, Ser: 0.83 mg/dL (ref 0.76–1.27)
Glucose: 166 mg/dL — ABNORMAL HIGH (ref 70–99)
Potassium: 4.6 mmol/L (ref 3.5–5.2)
Sodium: 141 mmol/L (ref 134–144)
eGFR: 94 mL/min/{1.73_m2} (ref >59)

## 2021-11-03 NOTE — Telephone Encounter (Signed)
Is this okay to refill? 

## 2021-11-04 ENCOUNTER — Other Ambulatory Visit: Payer: Self-pay | Admitting: Surgery

## 2021-11-04 DIAGNOSIS — Z952 Presence of prosthetic heart valve: Secondary | ICD-10-CM

## 2021-11-20 ENCOUNTER — Other Ambulatory Visit: Payer: Self-pay | Admitting: Family Medicine

## 2021-11-22 ENCOUNTER — Encounter: Payer: Self-pay | Admitting: Surgery

## 2021-11-22 ENCOUNTER — Other Ambulatory Visit: Payer: Self-pay

## 2021-11-22 ENCOUNTER — Ambulatory Visit
Admission: RE | Admit: 2021-11-22 | Discharge: 2021-11-22 | Disposition: A | Discharge status: Home / Self Care | Payer: PPO | Source: Ambulatory Visit | Attending: Surgery | Admitting: Surgery

## 2021-11-22 ENCOUNTER — Ambulatory Visit (INDEPENDENT_AMBULATORY_CARE_PROVIDER_SITE_OTHER): Payer: Self-pay | Admitting: Surgery

## 2021-11-22 VITALS — BP 118/73 | HR 75 | Resp 20 | Ht 72.0 in | Wt 240.0 lb

## 2021-11-22 DIAGNOSIS — J9811 Atelectasis: Secondary | ICD-10-CM | POA: Diagnosis not present

## 2021-11-22 DIAGNOSIS — Z952 Presence of prosthetic heart valve: Secondary | ICD-10-CM

## 2021-11-22 NOTE — Progress Notes (Signed)
HPI: Patient returns for routine postoperative follow-up having undergone aortic valve replacement using a 25 mm Edwards INSPIRIS RESILIA pericardial valve and clipping of left atrial appendage on 09/20/2021. The patient's early postoperative recovery while in the hospital was notable for an uncomplicated postoperative course. Since hospital discharge the patient reports that he has been doing well.  He has already gone on a 10-day cruise to [place] and through [place] and did well.  His stamina is much improved since surgery and he denies any shortness of breath or chest discomfort.   Current Outpatient Medications  Medication Sig Dispense Refill   acetaminophen (TYLENOL) 500 MG tablet Take 1,000 mg by mouth every 8 (eight) hours as needed for mild pain.      albuterol (VENTOLIN HFA) 108 (90 Base) MCG/ACT inhaler INHALE 2 PUFFS INTO THE LUNGS EVERY 6 HOURS AS NEEDED FOR WHEEZING OR SHORTNESS OF BREATH 54 g 0   allopurinol (ZYLOPRIM) 300 MG tablet Take 1 tablet (300 mg total) by mouth daily. 90 tablet 3   amiodarone (PACERONE) 200 MG tablet Take 1 tablet (200 mg total) by mouth daily. 90 tablet 3   ammonium lactate (LAC-HYDRIN) 12 % lotion Apply 1 application topically daily as needed for dry skin.     aspirin EC 81 MG tablet Take 1 tablet (81 mg total) by mouth daily. Swallow whole. 150 tablet 2   atorvastatin (LIPITOR) 40 MG tablet Take 1 tablet (40 mg total) by mouth at bedtime. 90 tablet 3   buPROPion (WELLBUTRIN XL) 300 MG 24 hr tablet Take 1 tablet (300 mg total) by mouth daily. (Patient taking differently: Take 150 mg by mouth in the morning.) 90 tablet 1   ELIQUIS 5 MG TABS tablet TAKE ONE TABLET BY MOUTH TWICE A DAY 180 tablet 1   fluticasone (FLONASE) 50 MCG/ACT nasal spray Place 2 sprays into both nostrils daily.     fluticasone-salmeterol (ADVAIR) 250-50 MCG/ACT AEPB INHALE 1 PUFF INTO THE LUNGS TWO TIMES A DAY 60 each 5   HYDROcodone-acetaminophen (NORCO/VICODIN)  5-325 MG tablet Take 1 tablet by mouth every 4 (four) hours as needed for moderate pain. 30 tablet 0   loratadine (CLARITIN) 10 MG tablet Take 10 mg by mouth in the morning.     LORazepam (ATIVAN) 0.5 MG tablet TAKE 1 TABLET TWICE DAILY AS NEEDED FOR ANXIETY. 20 tablet 0   metFORMIN (GLUCOPHAGE) 500 MG tablet Take 1 tablet (500 mg total) by mouth 2 (two) times daily with a meal. 180 tablet 3   metoprolol tartrate (LOPRESSOR) 25 MG tablet Take 1 tablet (25 mg total) by mouth 2 (two) times daily. 60 tablet 3   Multiple Vitamins-Minerals (MULTIVITAMIN WITH MINERALS) tablet Take 1 tablet by mouth daily.     olopatadine (PATANOL) 0.1 % ophthalmic solution Place 1 drop into both eyes daily as needed for allergies (tearing).      Omega-3 Fatty Acids (FISH OIL) 1000 MG CAPS Take 1,000 mg by mouth daily.     omeprazole (PRILOSEC) 20 MG capsule Take 20 mg by mouth daily.     sertraline (ZOLOFT) 50 MG tablet Take 1 tablet (50 mg total) by mouth daily. 90 tablet 1   sildenafil (VIAGRA) 100 MG tablet Take 1 tablet (100 mg total) by mouth daily as needed for erectile dysfunction. 20 tablet 1   tamsulosin (FLOMAX) 0.4 MG CAPS capsule Take 0.4 mg by mouth at bedtime.     traZODone (DESYREL) 50 MG tablet TAKE 1/2 TO 1 TABLETS  BY MOUTH AS NEEDED FOR SLEEP 30 tablet 0   zolpidem (AMBIEN) 5 MG tablet Take 1 tablet (5 mg total) by mouth at bedtime as needed for sleep. 15 tablet 1   No current facility-administered medications for this visit.    Physical Exam: BP 118/73 (BP Location: Right Arm, Patient Position: Sitting)    Pulse 75    Resp 20    Ht 6' (1.829 m)    Wt 240 lb (108.9 kg)    SpO2 91% Comment: RA   BMI 32.55 kg/m  He looks well. Cardiac exam shows regular rate and rhythm with normal heart sounds.  There is no murmur. Lungs are clear. The chest incision is well-healed and the sternum is stable. There is no peripheral edema  Diagnostic Tests:  Narrative & Impression  CLINICAL DATA:  Status post  aortic valve repair.   EXAM: CHEST - 2 VIEW   COMPARISON:  September 23, 2021.   FINDINGS: The heart size and mediastinal contours are within normal limits. Status post aortic valve repair. Left lung is clear. Elevated right hemidiaphragm is noted with mild right basilar subsegmental atelectasis. The visualized skeletal structures are unremarkable.   IMPRESSION: Elevated right hemidiaphragm with mild right basilar subsegmental atelectasis.     Electronically Signed   By: Marijo Conception M.D.   On: 11/22/2021 16:28    Impression:  Overall he is making great progress following aortic valve replacement surgery.  He had a follow-up echocardiogram on 11/02/2021 which showed the aortic valve mean gradient to be 7.6 mmHg with normal function of the prosthesis and no aortic insufficiency or paravalvular leak.  Left ventricular systolic function is normal.  I encouraged him to continue walking as much as possible.  He is planning on participating in cardiac rehab but is waiting for them to call him.  I asked him not to lift anything heavier than 10 pounds for 3 months postoperatively.  Plan:  He will continue to follow-up with his PCP and Dr. Tamala Julian and will return to see me if he has any problems with his incisions.   [name], MD [facility] [phone]

## 2021-11-22 NOTE — Telephone Encounter (Signed)
[facility] is requesting to fill pt trazodone. Please advise [facility]

## 2021-11-30 ENCOUNTER — Telehealth: Payer: Self-pay | Admitting: Interventional Cardiology

## 2021-11-30 DIAGNOSIS — I700 Atherosclerosis of aorta: Secondary | ICD-10-CM | POA: Insufficient documentation

## 2021-11-30 NOTE — Telephone Encounter (Signed)
° °  Primary Cardiologist: [name], MD  Chart reviewed as part of pre-operative protocol coverage. Given past medical history and time since last visit, based on ACC/AHA guidelines, Jeremy Tucker would be at acceptable risk for the planned procedure without further cardiovascular testing.   Patient with diagnosis of afib on Eliquis for anticoagulation.     Procedure: Bilateral Endoscopy Sinus Surgery and Bilateral Turbinate reduction Date of procedure: 12/24/21   CHA2DS2-VASc Score = 4  This indicates a 4.8% annual risk of stroke. The patient's score is based upon: CHF History: 0 HTN History: 1 Diabetes History: 1 Stroke History: 0 Vascular Disease History: 1 Age Score: 1 Gender Score: 0   Provoked DVT in 2001 s/p left TKA   CrCl >168mL/min Platelet count 444K   Per office protocol, patient can hold Eliquis for 2 days prior to procedure.   I will route this recommendation to the requesting party via Epic fax function and remove from pre-op pool.  Please call with questions.  [name]. [name] NP-C    11/30/2021, 2:23 PM [name] [address] Office [phone] Fax [phone]

## 2021-11-30 NOTE — Telephone Encounter (Signed)
° °  Pre-operative Risk Assessment    Patient Name: Jeremy Tucker  DOB: May 15, 1951 MRN: [id number]      Request for Surgical Clearance    Procedure:  Bilateral Endoscopy Sinus Surgery and  Bilateral Turbinate reduction  Date of Surgery:  Clearance 12/24/21                                 Surgeon:  Dr. Jerrell Belfast Surgeon's Group or Practice Name:  [facility] Phone number:  [phone] Fax number:  [phone] ATTN: Benita Stabile   Type of Clearance Requested:   - Medical  - Pharmacy:  Hold Apixaban (Eliquis) How long to hold   Type of Anesthesia:  General    Additional requests/questions:      Marquita Palms   11/30/2021, 10:31 AM

## 2021-11-30 NOTE — Telephone Encounter (Signed)
Patient with diagnosis of afib on Eliquis for anticoagulation.    Procedure: Bilateral Endoscopy Sinus Surgery and Bilateral Turbinate reduction Date of procedure: 12/24/21  CHA2DS2-VASc Score = 4  This indicates a 4.8% annual risk of stroke. The patient's score is based upon: CHF History: 0 HTN History: 1 Diabetes History: 1 Stroke History: 0 Vascular Disease History: 1 Age Score: 1 Gender Score: 0  Provoked DVT in 2001 s/p left TKA  CrCl >153mL/min Platelet count 444K  Per office protocol, patient can hold Eliquis for 2 days prior to procedure.

## 2021-12-07 ENCOUNTER — Telehealth (HOSPITAL_COMMUNITY): Payer: Self-pay

## 2021-12-07 NOTE — Telephone Encounter (Signed)
Called and spoke with pt in regards to CR, pt stated he is going out of town and will be back in March. He would like to be called at that time.

## 2021-12-07 NOTE — Telephone Encounter (Signed)
Pt insurance is active and benefits verified through [employer]. Co-pay $15.00, DED $0.00/$0.00 met, out of pocket $3,200.00/$0.00 met, co-insurance 0%. No pre-authorization required. Rasmeet/[facility], 12/07/21 @ 10:32AM, SHU#[id number]

## 2021-12-09 ENCOUNTER — Ambulatory Visit: Payer: PPO | Admitting: Family Medicine

## 2021-12-14 NOTE — Pre-Procedure Instructions (Signed)
Surgical Instructions    Your procedure is scheduled on Friday, February 10th.  Report to [facility] Main Entrance "A" at 05:30 A.M., then check in with the Admitting office.  Call this number if you have problems the morning of surgery:  [phone]   If you have any questions prior to your surgery date call [phone]: Open Monday-Friday 8am-4pm    Remember:  Do not eat after midnight the night before your surgery  You may drink clear liquids until 04:30 AM the morning of your surgery.   Clear liquids allowed are: Water, Non-Citrus Juices (without pulp), Carbonated Beverages, Clear Tea, Black Coffee Only (NO MILK, CREAM OR POWDERED CREAMER of any kind), and Gatorade.    Take these medicines the morning of surgery with A SIP OF WATER  allopurinol (ZYLOPRIM)  amiodarone (PACERONE) buPROPion (WELLBUTRIN XL) fluticasone (FLONASE)  loratadine (CLARITIN)  metoprolol tartrate (LOPRESSOR) omeprazole (PRILOSEC) sertraline (ZOLOFT)  If needed: acetaminophen (TYLENOL) albuterol (VENTOLIN HFA)- if needed, bring with you on the day of surgery fluticasone-salmeterol (ADVAIR)  LORazepam (ATIVAN) olopatadine (PATANOL) eye drops  Hold Eliquis for 2 days prior to surgery.  Follow your surgeon's instructions on when to stop Aspirin.  If no instructions were given by your surgeon then you will need to call the office to get those instructions.     As of today, STOP taking any Aleve, Naproxen, Ibuprofen, Motrin, Advil, Goody's, BC's, all herbal medications, fish oil, and all vitamins.   WHAT DO I DO ABOUT MY DIABETES MEDICATION?   Do not take metFORMIN (GLUCOPHAGE) the morning of surgery.     HOW TO MANAGE YOUR DIABETES BEFORE AND AFTER SURGERY  Why is it important to control my blood sugar before and after surgery? Improving blood sugar levels before and after surgery helps healing and can limit problems. A way of improving blood sugar control is eating a healthy diet by:   Eating less sugar and carbohydrates  Increasing activity/exercise  Talking with your doctor about reaching your blood sugar goals High blood sugars (greater than 180 mg/dL) can raise your risk of infections and slow your recovery, so you will need to focus on controlling your diabetes during the weeks before surgery. Make sure that the doctor who takes care of your diabetes knows about your planned surgery including the date and location.  How do I manage my blood sugar before surgery? Check your blood sugar at least 4 times a day, starting 2 days before surgery, to make sure that the level is not too high or low.  Check your blood sugar the morning of your surgery when you wake up and every 2 hours until you get to the Short Stay unit.  If your blood sugar is less than 70 mg/dL, you will need to treat for low blood sugar: Do not take insulin. Treat a low blood sugar (less than 70 mg/dL) with  cup of clear juice (cranberry or apple), 4 glucose tablets, OR glucose gel. Recheck blood sugar in 15 minutes after treatment (to make sure it is greater than 70 mg/dL). If your blood sugar is not greater than 70 mg/dL on recheck, call [phone] for further instructions. Report your blood sugar to the short stay nurse when you get to Short Stay.  If you are admitted to the hospital after surgery: Your blood sugar will be checked by the staff and you will probably be given insulin after surgery (instead of oral diabetes medicines) to make sure you have good blood sugar levels. The  goal for blood sugar control after surgery is 80-180 mg/dL.                      Do NOT Smoke (Tobacco/Vaping) for 24 hours prior to your procedure.  If you use a CPAP at night, you may bring your mask/headgear for your overnight stay.   Contacts, glasses, piercing's, hearing aid's, dentures or partials may not be worn into surgery, please bring cases for these belongings.    For patients admitted to the hospital,  discharge time will be determined by your treatment team.   Patients discharged the day of surgery will not be allowed to drive home, and someone needs to stay with them for 24 hours.  NO VISITORS WILL BE ALLOWED IN PRE-OP WHERE PATIENTS ARE PREPPED FOR SURGERY.  ONLY 1 SUPPORT PERSON MAY BE PRESENT IN THE WAITING ROOM WHILE YOU ARE IN SURGERY.  IF YOU ARE TO BE ADMITTED, ONCE YOU ARE IN YOUR ROOM YOU WILL BE ALLOWED TWO (2) VISITORS. (1) VISITOR MAY STAY OVERNIGHT BUT MUST ARRIVE TO THE ROOM BY 8pm.  Minor children may have two parents present. Special consideration for safety and communication needs will be reviewed on a case by case basis.   Special instructions:   [place]- Preparing For Surgery  Before surgery, you can play an important role. Because skin is not sterile, your skin needs to be as free of germs as possible. You can reduce the number of germs on your skin by washing with CHG (chlorahexidine gluconate) Soap before surgery.  CHG is an antiseptic cleaner which kills germs and bonds with the skin to continue killing germs even after washing.    Oral Hygiene is also important to reduce your risk of infection.  Remember - BRUSH YOUR TEETH THE MORNING OF SURGERY WITH YOUR REGULAR TOOTHPASTE  Please do not use if you have an allergy to CHG or antibacterial soaps. If your skin becomes reddened/irritated stop using the CHG.  Do not shave (including legs and underarms) for at least 48 hours prior to first CHG shower. It is OK to shave your face.  Please follow these instructions carefully.   Shower the NIGHT BEFORE SURGERY and the MORNING OF SURGERY  If you chose to wash your hair, wash your hair first as usual with your normal shampoo.  After you shampoo, rinse your hair and body thoroughly to remove the shampoo.  Use CHG Soap as you would any other liquid soap. You can apply CHG directly to the skin and wash gently with a scrungie or a clean washcloth.   Apply the CHG Soap to  your body ONLY FROM THE NECK DOWN.  Do not use on open wounds or open sores. Avoid contact with your eyes, ears, mouth and genitals (private parts). Wash Face and genitals (private parts)  with your normal soap.   Wash thoroughly, paying special attention to the area where your surgery will be performed.  Thoroughly rinse your body with warm water from the neck down.  DO NOT shower/wash with your normal soap after using and rinsing off the CHG Soap.  Pat yourself dry with a CLEAN TOWEL.  Wear CLEAN PAJAMAS to bed the night before surgery  Place CLEAN SHEETS on your bed the night before your surgery  DO NOT SLEEP WITH PETS.   Day of Surgery: Shower with CHG soap. Do not wear jewelry Do not wear lotions, powders, colognes, or deodorant. Men may shave face and neck. Do  not bring valuables to the hospital. [facility] is not responsible for any belongings or valuables. Wear Clean/Comfortable clothing the morning of surgery Remember to brush your teeth WITH YOUR REGULAR TOOTHPASTE.   Please read over the following fact sheets that you were given.   3 days prior to your procedure or After your COVID test   You are not required to quarantine however you are required to wear a well-fitting mask when you are out and around people not in your household. If your mask becomes wet or soiled, replace with a new one.   Wash your hands often with soap and water for 20 seconds or clean your hands with an alcohol-based hand sanitizer that contains at least 60% alcohol.   Do not share personal items.   Notify your provider:  o if you are in close contact with someone who has COVID  o or if you develop a fever of 100.4 or greater, sneezing, cough, sore throat, shortness of breath or body aches.

## 2021-12-15 ENCOUNTER — Encounter (HOSPITAL_COMMUNITY)
Admission: RE | Admit: 2021-12-15 | Discharge: 2021-12-15 | Disposition: A | Discharge status: Home / Self Care | Payer: PPO | Source: Ambulatory Visit | Attending: Otolaryngology | Admitting: Otolaryngology

## 2021-12-15 ENCOUNTER — Encounter (HOSPITAL_COMMUNITY): Payer: Self-pay

## 2021-12-15 ENCOUNTER — Other Ambulatory Visit: Payer: Self-pay

## 2021-12-15 VITALS — BP 159/74 | HR 52 | Temp 97.6°F | Resp 18 | Ht 72.0 in | Wt 244.5 lb

## 2021-12-15 DIAGNOSIS — I4891 Unspecified atrial fibrillation: Secondary | ICD-10-CM | POA: Diagnosis not present

## 2021-12-15 DIAGNOSIS — K449 Diaphragmatic hernia without obstruction or gangrene: Secondary | ICD-10-CM | POA: Diagnosis not present

## 2021-12-15 DIAGNOSIS — Z952 Presence of prosthetic heart valve: Secondary | ICD-10-CM | POA: Diagnosis not present

## 2021-12-15 DIAGNOSIS — R0600 Dyspnea, unspecified: Secondary | ICD-10-CM | POA: Diagnosis not present

## 2021-12-15 DIAGNOSIS — Z86718 Personal history of other venous thrombosis and embolism: Secondary | ICD-10-CM | POA: Insufficient documentation

## 2021-12-15 DIAGNOSIS — M1990 Unspecified osteoarthritis, unspecified site: Secondary | ICD-10-CM | POA: Insufficient documentation

## 2021-12-15 DIAGNOSIS — Z96653 Presence of artificial knee joint, bilateral: Secondary | ICD-10-CM | POA: Diagnosis not present

## 2021-12-15 DIAGNOSIS — Z01818 Encounter for other preprocedural examination: Secondary | ICD-10-CM

## 2021-12-15 DIAGNOSIS — E1142 Type 2 diabetes mellitus with diabetic polyneuropathy: Secondary | ICD-10-CM | POA: Insufficient documentation

## 2021-12-15 DIAGNOSIS — E118 Type 2 diabetes mellitus with unspecified complications: Secondary | ICD-10-CM | POA: Insufficient documentation

## 2021-12-15 DIAGNOSIS — J342 Deviated nasal septum: Secondary | ICD-10-CM | POA: Insufficient documentation

## 2021-12-15 DIAGNOSIS — Z6833 Body mass index (BMI) 33.0-33.9, adult: Secondary | ICD-10-CM | POA: Insufficient documentation

## 2021-12-15 DIAGNOSIS — G4733 Obstructive sleep apnea (adult) (pediatric): Secondary | ICD-10-CM | POA: Insufficient documentation

## 2021-12-15 DIAGNOSIS — I2510 Atherosclerotic heart disease of native coronary artery without angina pectoris: Secondary | ICD-10-CM | POA: Insufficient documentation

## 2021-12-15 DIAGNOSIS — J343 Hypertrophy of nasal turbinates: Secondary | ICD-10-CM | POA: Diagnosis not present

## 2021-12-15 DIAGNOSIS — Z01812 Encounter for preprocedural laboratory examination: Secondary | ICD-10-CM | POA: Diagnosis not present

## 2021-12-15 DIAGNOSIS — Z7901 Long term (current) use of anticoagulants: Secondary | ICD-10-CM | POA: Insufficient documentation

## 2021-12-15 DIAGNOSIS — I10 Essential (primary) hypertension: Secondary | ICD-10-CM | POA: Diagnosis not present

## 2021-12-15 DIAGNOSIS — Z8546 Personal history of malignant neoplasm of prostate: Secondary | ICD-10-CM | POA: Diagnosis not present

## 2021-12-15 DIAGNOSIS — J339 Nasal polyp, unspecified: Secondary | ICD-10-CM | POA: Insufficient documentation

## 2021-12-15 DIAGNOSIS — E669 Obesity, unspecified: Secondary | ICD-10-CM | POA: Insufficient documentation

## 2021-12-15 DIAGNOSIS — I480 Paroxysmal atrial fibrillation: Secondary | ICD-10-CM | POA: Diagnosis not present

## 2021-12-15 DIAGNOSIS — J324 Chronic pansinusitis: Secondary | ICD-10-CM | POA: Insufficient documentation

## 2021-12-15 DIAGNOSIS — J3489 Other specified disorders of nose and nasal sinuses: Secondary | ICD-10-CM | POA: Insufficient documentation

## 2021-12-15 LAB — BASIC METABOLIC PANEL
Anion gap: 10 (ref 5–15)
BUN: 17 mg/dL (ref 8–23)
CO2: 24 mmol/L (ref 22–32)
Calcium: 9.2 mg/dL (ref 8.9–10.3)
Chloride: 103 mmol/L (ref 98–111)
Creatinine, Ser: 0.91 mg/dL (ref 0.61–1.24)
GFR, Estimated: >60 mL/min (ref >60)
Glucose, Bld: 153 mg/dL — ABNORMAL HIGH (ref 70–99)
Potassium: 4.6 mmol/L (ref 3.5–5.1)
Sodium: 137 mmol/L (ref 135–145)

## 2021-12-15 LAB — CBC
HCT: 41.2 % (ref 39.0–52.0)
Hemoglobin: 13.3 g/dL (ref 13.0–17.0)
MCH: 30.4 pg (ref 26.0–34.0)
MCHC: 32.3 g/dL (ref 30.0–36.0)
MCV: 94.3 fL (ref 80.0–100.0)
Platelets: 161 10*3/uL (ref 150–400)
RBC: 4.37 MIL/uL (ref 4.22–5.81)
RDW: 13.5 % (ref 11.5–15.5)
WBC: 8 10*3/uL (ref 4.0–10.5)
nRBC: 0 % (ref 0.0–0.2)

## 2021-12-15 LAB — GLUCOSE, CAPILLARY: Glucose-Capillary: 149 mg/dL — ABNORMAL HIGH (ref 70–99)

## 2021-12-15 NOTE — Progress Notes (Signed)
PCP - Dr. Jill Alexanders Cardiologist - Dr. [name]  PPM/ICD - denies   Chest x-ray - 11/22/21 EKG - 10/22/21 Stress Test - 04/26/17 ECHO - 11/02/21 Cardiac Cath - 08/04/21  Sleep Study - yes, OSA+ CPAP - no  DM- Type 2 Pt does not check CBG at home  Blood Thinner Instructions: Hold Eliquis 2 days prior to surgery Aspirin Instructions: f/u with surgeon for instructions on ASA  ERAS Protcol - yes, no drink   COVID TEST- n/a, ambulatory surgery   Anesthesia review: yes, cardiac hx  Patient denies shortness of breath, fever, cough and chest pain at PAT appointment   All instructions explained to the patient, with a verbal understanding of the material. Patient agrees to go over the instructions while at home for a better understanding. Patient also instructed to wear a mask in public for 3 days prior to surgery. The opportunity to ask questions was provided.

## 2021-12-16 LAB — HEMOGLOBIN A1C
Hgb A1c MFr Bld: 7.1 % — ABNORMAL HIGH (ref 4.8–5.6)
Mean Plasma Glucose: 157 mg/dL

## 2021-12-16 NOTE — Anesthesia Preprocedure Evaluation (Addendum)
Anesthesia Evaluation  Patient identified by MRN, date of birth, ID band Patient awake  General Assessment Comment:Nasal polyposis; Chronic pansinusitis; Deviated septum; Nasal obstruction; Nasal turbinate hypertrophy  Reviewed: Allergy & Precautions, NPO status , Patient's Chart, lab work & pertinent test results, reviewed documented beta blocker date and time   Airway Mallampati: II  TM Distance: >3 FB Neck ROM: Full    Dental  (+) Teeth Intact, Dental Advisory Given   Pulmonary asthma , sleep apnea ,    Pulmonary exam normal breath sounds clear to auscultation       Cardiovascular hypertension, Pt. on medications and Pt. on home beta blockers + CAD, + Peripheral Vascular Disease (Dilated ascending aorta) and + DVT  Normal cardiovascular exam+ dysrhythmias Atrial Fibrillation  Rhythm:Regular Rate:Normal  S/p AVR (67mm Inspiris Resilia)   Neuro/Psych PSYCHIATRIC DISORDERS Anxiety Depression  Neuromuscular disease    GI/Hepatic Neg liver ROS, hiatal hernia, GERD  Medicated,  Endo/Other  diabetes, Type 2, Oral Hypoglycemic AgentsObesity   Renal/GU negative Renal ROS     Musculoskeletal  (+) Arthritis ,   Abdominal   Peds  Hematology  (+) Blood dyscrasia (Eliquis), ,   Anesthesia Other Findings   Reproductive/Obstetrics                           Anesthesia Physical Anesthesia Plan  ASA: 3  Anesthesia Plan: General   Post-op Pain Management: Tylenol PO (pre-op)   Induction: Intravenous  PONV Risk Score and Plan: 3 and Dexamethasone and Ondansetron  Airway Management Planned: Oral ETT  Additional Equipment:   Intra-op Plan:   Post-operative Plan: Extubation in OR  Informed Consent: I have reviewed the patients History and Physical, chart, labs and discussed the procedure including the risks, benefits and alternatives for the proposed anesthesia with the patient or authorized  representative who has indicated his/her understanding and acceptance.     Dental advisory given  Plan Discussed with: CRNA  Anesthesia Plan Comments: (PAT note written 12/16/2021 by Myra Gianotti, PA-C. )      Anesthesia Quick Evaluation

## 2021-12-16 NOTE — Progress Notes (Signed)
Anesthesia Chart Review:  Case: [id number] Date/Time: 12/24/21 0715   Procedures:      BILATERAL ENDOSCOPIC SINUS SURGERY WITH INTRANASAL POLYPECTOMY AND FUSION NAVIGATION (Bilateral)     ENDOSCOPIC INFERIOR TURBINATE REDUCTION (Bilateral)   Anesthesia type: General   Pre-op diagnosis: Nasal polyposis; Chronic pansinusitis; Deviated septum; Nasal obstruction; Nasal turbinate hypertrophy   Location: [facility] OR ROOM 09 / [place] [facility]   Surgeons: Jerrell Belfast, MD       DISCUSSION: Patient is a 71 year old male scheduled for the above procedure.  History includes never smoker, CAD (non-obstructive 08/04/21), severe AS (s/p AVR , 25 mm pericardial tissue, clipping of LAA 09/20/21), atrial fibrillation (s/p DCCV 02/25/15, s/p ablation 03/12/15; loop recorder 09/15/15), exertional dyspnea, HTN, DM2 (with peripheral neuropathy), OSA (moderate OSA AHI 16.9/hr 03/08/15, CPAP intolerant), hiatal hernia, osteoarthritis (left TKA 07/21/97, complicated by post-op DVT; right TKA 10/30/17), prostate cancer (s/p I-125 seed implant 03/08/21). S/p incarcerated umbilical hernia repair 08/04/18. BMI is consistent with obesity.  Preoperative cardiology input outlined on 11/30/2021 by Coletta Memos, NP, "Given past medical history and time since last visit, based on ACC/AHA guidelines, GERY SABEDRA would be at acceptable risk for the planned procedure without further cardiovascular testing.Marland KitchenMarland KitchenPer office protocol, patient can hold Eliquis for 2 days prior to procedure."  PAT RN advised him to follow-up with surgeon regarding perioperative aspirin instructions.    Anesthesia team to evaluate on the day of surgery.   VS: BP (!) 159/74    Pulse (!) 52    Temp 36.4 C (Oral)    Resp 18    Ht 6' (1.829 m)    Wt 110.9 kg    SpO2 97%    BMI 33.16 kg/m   PROVIDERS: Denita Lung, MD is PCP Olen Pel, MD is cardiologist.  Last visit 10/22/2021.  He was cleared to go on a cruise to [place]. [name], MD is EP. Last visit  seen 12/11/17. As need follow-up at [facility] 05/15/18.  Gilford Raid, MD is CT surgeon.  As needed follow-up recommended at 11/22/2021 office visit.   LABS: Labs reviewed: Acceptable for surgery. (all labs ordered are listed, but only abnormal results are displayed)  Labs Reviewed  GLUCOSE, CAPILLARY - Abnormal; Notable for the following components:      Result Value   Glucose-Capillary 149 (*)    All other components within normal limits  HEMOGLOBIN A1C - Abnormal; Notable for the following components:   Hgb A1c MFr Bld 7.1 (*)    All other components within normal limits  BASIC METABOLIC PANEL - Abnormal; Notable for the following components:   Glucose, Bld 153 (*)    All other components within normal limits  CBC    IMAGES: CXR 11/22/21: FINDINGS: The heart size and mediastinal contours are within normal limits. Status post aortic valve repair. Left lung is clear. Elevated right hemidiaphragm is noted with mild right basilar subsegmental atelectasis. The visualized skeletal structures are unremarkable. IMPRESSION: Elevated right hemidiaphragm with mild right basilar subsegmental atelectasis.    EKG: 10/22/2021: Sinus rhythm.  Nonspecific ST-T wave abnormality.   CV: Echo 11/02/21: IMPRESSIONS   1. Left ventricular ejection fraction, by estimation, is 60 to 65%. The  left ventricle has normal function. The left ventricle has no regional  wall motion abnormalities. There is mild asymmetric left ventricular  hypertrophy of the basal and septal  segments. Left ventricular diastolic parameters are indeterminate.   2. Right ventricular systolic function is normal. The right ventricular  size is normal. There is normal pulmonary artery systolic pressure. The  estimated right ventricular systolic pressure is 16.1 mmHg.   3. The mitral valve is normal in structure. Mild mitral valve  regurgitation. No evidence of mitral stenosis.   4. The aortic valve has been  repaired/replaced. Aortic valve  regurgitation is not visualized. No aortic stenosis is present. There is a  25 mm Inspiris Resilia valve present in the aortic position. Echo findings  are consistent with normal structure and  function of the aortic valve prosthesis. Aortic valve area, by VTI  measures 2.10 cm. Aortic valve mean gradient measures 7.6 mmHg. Aortic  valve Vmax measures 1.99 m/s.   5. The inferior vena cava is normal in size with greater than 50%  respiratory variability, suggesting right atrial pressure of 3 mmHg.    US Carotid 08/17/21: Summary:  - Right Carotid: The extracranial vessels were near-normal with only minimal wall thickening or plaque.  - Left Carotid: The extracranial vessels were near-normal with only minimal wall thickening or plaque.  - Vertebrals:  Bilateral vertebral arteries demonstrate antegrade flow.  - Subclavians: Normal flow hemodynamics were seen in bilateral subclavian arteries.    RHC/LHC 08/04/21 (PRE-AVR): CONCLUSIONS: Normal left main 40% proximal RCA stenosis.  Right dominant anatomy. Widely patent circumflex. 20 to 30% mid LAD.  The LAD reaches the left ventricular apex. Normal pulmonary artery pressures, mean 23 mmHg. Pulmonary capillary wedge pressure mean, 13 mmHg.   RECOMMENDATIONS: Referred to the aortic valve clinic for consideration of TAVR versus SAVR  Past Medical History:  Diagnosis Date   Anxiety    BPH (benign prostatic hyperplasia)    BPH without obstruction/lower urinary tract symptoms    Depression    Diabetic peripheral neuropathy ([place]) 01/07/2019   Dyspnea    w/exertion   ED (erectile dysfunction)    Essential hypertension    GERD (gastroesophageal reflux disease)    Hiatal hernia    History of chronic bronchitis    per pt used inhaler as needed   History of DVT of lower extremity 2001   s/p  left TKA completed blood thinner treatment,  per pt no previous clot and none since   History of gastric polyp     per pt benign   History of kidney stones    Hyperlipidemia    Mild CAD cardiologist--- dr h. Tamala Julian   nuclear study 04-14-2017 normal , low risk, nuclear ef 53%;  cardiac cath 09-19-2018  midRCA 30% stenosis otherwise normal coronaries,  heavy AV calicification with decreased mobility with peak grandiant 47mmHg   Mild dilation of ascending aorta ([place])    per echo 01-13-2021  46mm   Nephrolithiasis    OA (osteoarthritis)    OSA (obstructive sleep apnea)    per pt refused cpap, intolerant;  moderate osa per study in epic 03-08-2015   PAF (paroxysmal atrial fibrillation) ([place])    followed by dr h. 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/2018   Prostate cancer [facility]) urologist--- dr dahlstedt/  oncology-- dr Tammi Klippel   first dx 03/ 2021,  Glesaon 3+4  PSA 6.95   Pulmonary nodule 2019   incidently finding on CTA 09-25-2018, stable   Right carpal tunnel syndrome 01/07/2019   Severe aortic stenosis    Type 2 diabetes mellitus ([place])    followed by pcp   (03-02-2021  pt stated does not check blood sugar at home)  Umbilical hernia     Past Surgical History:  Procedure Laterality Date   AORTIC VALVE REPLACEMENT N/A 09/20/2021   Procedure: AORTIC VALVE REPLACEMENT (AVR) USING 25 MM INSPIRIS RESILIA  AORTIC VALVE;  Surgeon: [name], MD;  Location: [place];  Service: Open Heart Surgery;  Laterality: N/A;   ATRIAL FIBRILLATION ABLATION N/A 03/12/2015   Procedure: ATRIAL FIBRILLATION ABLATION;  Surgeon: [name], MD;  Location: [facility] CATH LAB;  Service: Cardiovascular;  Laterality: N/A;   CARDIAC CATHETERIZATION  1990's   "Dr. Melvern Banker", no blockages per pt   CARDIOVERSION N/A 02/25/2015   Procedure: CARDIOVERSION;  Surgeon: Sueanne Margarita, MD;  Location: [place];  Service: Cardiovascular;  Laterality: N/A;   CATARACT EXTRACTION W/ INTRAOCULAR LENS  IMPLANT, BILATERAL  2016   CLIPPING OF ATRIAL APPENDAGE Left 09/20/2021    Procedure: CLIPPING OF ATRIAL APPENDAGE USING ATRICURE 25 MM ATRICLIP;  Surgeon: [name], MD;  Location: [place];  Service: Open Heart Surgery;  Laterality: Left;   COLONOSCOPY  last one 2013  dr Earlean Shawl   CYSTOSCOPY N/A 03/08/2021   Procedure: CYSTOSCOPY BLADDER CALCULI EXTRACTION ;  Surgeon: Franchot Gallo, MD;  Location: [facility];  Service: Urology;  Laterality: N/A;   CYSTOSCOPY WITH RETROGRADE PYELOGRAM, URETEROSCOPY AND STENT PLACEMENT Left 04/24/2015   Procedure: URETHRAL MEATAL DILATION, CYSTOSCOPY WITH LEFT RETROGRADE PYELOGRAM, LEFT URETEROSCOPY, STONE BASKET EXTRACTION,  LEFT DOUBLE J STENT PLACEMENT;  Surgeon: Carolan Clines, MD;  Location: [facility];  Service: Urology;  Laterality: Left;   EP IMPLANTABLE DEVICE N/A 09/15/2015   Procedure: Loop Recorder Insertion;  Surgeon: [name], MD;  Location: [place] CV LAB;  Service: Cardiovascular;  Laterality: N/A;   ESOPHAGOGASTRODUODENOSCOPY  last one 2017   EXTRACORPOREAL SHOCK WAVE LITHOTRIPSY Bilateral 04/02/2020   Procedure: EXTRACORPOREAL SHOCK WAVE LITHOTRIPSY (ESWL);  Surgeon: Cleon Gustin, MD;  Location: [facility];  Service: Urology;  Laterality: Bilateral;   EXTRACORPOREAL SHOCK WAVE LITHOTRIPSY Left 04/06/2020   Procedure: EXTRACORPOREAL SHOCK WAVE LITHOTRIPSY (ESWL);  Surgeon: [name], MD;  Location: [facility];  Service: Urology;  Laterality: Left;   EYE SURGERY     HOLMIUM LASER APPLICATION Left 00/92/3300   Procedure: HOLMIUM LASER APPLICATION;  Surgeon: Carolan Clines, MD;  Location: [facility];  Service: Urology;  Laterality: Left;   implantable loopr recorder removal  02/22/2021   MDT LINQ removed in office by Dr Allred   JOINT REPLACEMENT     KNEE ARTHROSCOPY Bilateral "multiple times"   LEFT HEART CATH AND CORONARY ANGIOGRAPHY N/A 09/19/2018   Procedure: LEFT HEART CATH AND CORONARY ANGIOGRAPHY;  Surgeon: Belva Crome, MD;   Location: [place] CV LAB;  Service: Cardiovascular;  Laterality: N/A;   PERCUTANEOUS NEPHROSTOLITHOTOMY  1980s and 1990s   RADIOACTIVE SEED IMPLANT N/A 03/08/2021   Procedure: RADIOACTIVE SEED IMPLANT/BRACHYTHERAPY IMPLANT;  Surgeon: Franchot Gallo, MD;  Location: [facility];  Service: Urology;  Laterality: N/A;  90 MINS   RIGHT/LEFT HEART CATH AND CORONARY ANGIOGRAPHY N/A 08/04/2021   Procedure: RIGHT/LEFT HEART CATH AND CORONARY ANGIOGRAPHY;  Surgeon: Belva Crome, MD;  Location: [place] CV [facility];  Service: Cardiovascular;  Laterality: N/A;   SKIN BIOPSY Left 05/06/2020   Shave biopsy left posterior hand neurofiberoma.   SPACE OAR INSTILLATION N/A 03/08/2021   Procedure: SPACE OAR INSTILLATION;  Surgeon: Franchot Gallo, MD;  Location: [facility];  Service: Urology;  Laterality: N/A;   TEE WITHOUT CARDIOVERSION N/A 03/12/2015  Procedure: TRANSESOPHAGEAL ECHOCARDIOGRAM (TEE);  Surgeon: Pixie Casino, MD;  Location: [place];  Service: Cardiovascular;  Laterality: N/A;   TEE WITHOUT CARDIOVERSION N/A 09/20/2021   Procedure: TRANSESOPHAGEAL ECHOCARDIOGRAM (TEE);  Surgeon: [name], MD;  Location: [place];  Service: Open Heart Surgery;  Laterality: N/A;   TONSILLECTOMY  age 86   TOTAL KNEE ARTHROPLASTY Left 11-22-1999  @[facility]    TOTAL KNEE ARTHROPLASTY Right 10/30/2017   Procedure: RIGHT TOTAL KNEE ARTHROPLASTY;  Surgeon: Paralee Cancel, MD;  Location: [facility];  Service: Orthopedics;  Laterality: Right;  90 mins   UMBILICAL HERNIA REPAIR N/A 07/05/2021   Procedure: REPAIR INCARCERATED UMBILICAL HERNIA WITH MESH;  Surgeon: Georganna Skeans, MD;  Location: [place];  Service: General;  Laterality: N/A;    MEDICATIONS:  acetaminophen (TYLENOL) 500 MG tablet   albuterol (VENTOLIN HFA) 108 (90 Base) MCG/ACT inhaler   allopurinol (ZYLOPRIM) 300 MG tablet   amiodarone (PACERONE) 200 MG tablet   ammonium lactate (LAC-HYDRIN) 12 % lotion   aspirin EC 81  MG tablet   atorvastatin (LIPITOR) 40 MG tablet   buPROPion (WELLBUTRIN XL) 300 MG 24 hr tablet   ELIQUIS 5 MG TABS tablet   fluticasone (FLONASE) 50 MCG/ACT nasal spray   fluticasone-salmeterol (ADVAIR) 250-50 MCG/ACT AEPB   HYDROcodone-acetaminophen (NORCO/VICODIN) 5-325 MG tablet   loratadine (CLARITIN) 10 MG tablet   LORazepam (ATIVAN) 0.5 MG tablet   metFORMIN (GLUCOPHAGE) 500 MG tablet   metoprolol tartrate (LOPRESSOR) 25 MG tablet   Multiple Vitamins-Minerals (MULTIVITAMIN WITH MINERALS) tablet   olopatadine (PATANOL) 0.1 % ophthalmic solution   Omega-3 Fatty Acids (FISH OIL) 1000 MG CAPS   omeprazole (PRILOSEC) 20 MG capsule   sertraline (ZOLOFT) 50 MG tablet   sildenafil (VIAGRA) 100 MG tablet   tamsulosin (FLOMAX) 0.4 MG CAPS capsule   traZODone (DESYREL) 50 MG tablet   zolpidem (AMBIEN) 5 MG tablet   No current facility-administered medications for this encounter.    Myra Gianotti, PA-C Surgical Short Stay/Anesthesiology [facility] Phone [phone] [facility] Phone [phone] 12/16/2021 6:10 PM

## 2021-12-17 ENCOUNTER — Telehealth: Payer: Self-pay | Admitting: Interventional Cardiology

## 2021-12-17 NOTE — Telephone Encounter (Signed)
Patient wanted to talk to Dr. Thompson Caul Nurse Anderson Malta about how to take his medications since his recent hospital stay. He only wanted to talk to her

## 2021-12-20 NOTE — Telephone Encounter (Signed)
Pt called due to confusion about his medications.  Was unable to give specific concerns or questions about meds.  Pt asked that I mail him a copy of his most recent med list and he will call with any questions once he receives it. Pt appreciative for assistance.

## 2021-12-24 ENCOUNTER — Other Ambulatory Visit: Payer: Self-pay

## 2021-12-24 ENCOUNTER — Encounter (HOSPITAL_COMMUNITY)
Admission: RE | Disposition: A | Discharge status: Home / Self Care | Payer: Self-pay | Source: Home / Self Care | Attending: Otolaryngology

## 2021-12-24 ENCOUNTER — Ambulatory Visit (HOSPITAL_COMMUNITY)
Admission: RE | Admit: 2021-12-24 | Discharge: 2021-12-24 | Disposition: A | Discharge status: Home / Self Care | Payer: PPO | Attending: Otolaryngology | Admitting: Otolaryngology

## 2021-12-24 ENCOUNTER — Encounter (HOSPITAL_COMMUNITY): Payer: Self-pay | Admitting: Otolaryngology

## 2021-12-24 ENCOUNTER — Ambulatory Visit (HOSPITAL_COMMUNITY): Payer: PPO | Admitting: Vascular Surgery

## 2021-12-24 ENCOUNTER — Ambulatory Visit (HOSPITAL_BASED_OUTPATIENT_CLINIC_OR_DEPARTMENT_OTHER): Payer: PPO | Admitting: Anesthesiology

## 2021-12-24 DIAGNOSIS — Z86718 Personal history of other venous thrombosis and embolism: Secondary | ICD-10-CM | POA: Insufficient documentation

## 2021-12-24 DIAGNOSIS — Z7951 Long term (current) use of inhaled steroids: Secondary | ICD-10-CM | POA: Insufficient documentation

## 2021-12-24 DIAGNOSIS — F32A Depression, unspecified: Secondary | ICD-10-CM | POA: Diagnosis not present

## 2021-12-24 DIAGNOSIS — Z7984 Long term (current) use of oral hypoglycemic drugs: Secondary | ICD-10-CM | POA: Insufficient documentation

## 2021-12-24 DIAGNOSIS — Z7901 Long term (current) use of anticoagulants: Secondary | ICD-10-CM | POA: Diagnosis not present

## 2021-12-24 DIAGNOSIS — J339 Nasal polyp, unspecified: Secondary | ICD-10-CM

## 2021-12-24 DIAGNOSIS — Z79899 Other long term (current) drug therapy: Secondary | ICD-10-CM | POA: Diagnosis not present

## 2021-12-24 DIAGNOSIS — J324 Chronic pansinusitis: Secondary | ICD-10-CM | POA: Diagnosis not present

## 2021-12-24 DIAGNOSIS — G4733 Obstructive sleep apnea (adult) (pediatric): Secondary | ICD-10-CM | POA: Diagnosis not present

## 2021-12-24 DIAGNOSIS — E1151 Type 2 diabetes mellitus with diabetic peripheral angiopathy without gangrene: Secondary | ICD-10-CM | POA: Diagnosis not present

## 2021-12-24 DIAGNOSIS — J328 Other chronic sinusitis: Secondary | ICD-10-CM | POA: Insufficient documentation

## 2021-12-24 DIAGNOSIS — Z952 Presence of prosthetic heart valve: Secondary | ICD-10-CM | POA: Insufficient documentation

## 2021-12-24 DIAGNOSIS — J343 Hypertrophy of nasal turbinates: Secondary | ICD-10-CM | POA: Diagnosis not present

## 2021-12-24 DIAGNOSIS — E1142 Type 2 diabetes mellitus with diabetic polyneuropathy: Secondary | ICD-10-CM | POA: Diagnosis not present

## 2021-12-24 DIAGNOSIS — K219 Gastro-esophageal reflux disease without esophagitis: Secondary | ICD-10-CM | POA: Diagnosis not present

## 2021-12-24 DIAGNOSIS — E785 Hyperlipidemia, unspecified: Secondary | ICD-10-CM | POA: Insufficient documentation

## 2021-12-24 DIAGNOSIS — I2510 Atherosclerotic heart disease of native coronary artery without angina pectoris: Secondary | ICD-10-CM | POA: Insufficient documentation

## 2021-12-24 DIAGNOSIS — J342 Deviated nasal septum: Secondary | ICD-10-CM | POA: Diagnosis not present

## 2021-12-24 DIAGNOSIS — J3489 Other specified disorders of nose and nasal sinuses: Secondary | ICD-10-CM | POA: Diagnosis not present

## 2021-12-24 DIAGNOSIS — I10 Essential (primary) hypertension: Secondary | ICD-10-CM | POA: Insufficient documentation

## 2021-12-24 DIAGNOSIS — J329 Chronic sinusitis, unspecified: Secondary | ICD-10-CM | POA: Diagnosis not present

## 2021-12-24 HISTORY — PX: SINUS ENDO WITH FUSION: SHX5329

## 2021-12-24 LAB — GLUCOSE, CAPILLARY
Glucose-Capillary: 161 mg/dL — ABNORMAL HIGH (ref 70–99)
Glucose-Capillary: 212 mg/dL — ABNORMAL HIGH (ref 70–99)

## 2021-12-24 SURGERY — SURGERY, PARANASAL SINUS, ENDOSCOPIC, WITH NASAL SEPTOPLASTY, TURBINOPLASTY, AND MAXILLARY SINUSOTOMY
Anesthesia: General | Site: Nose | Laterality: Bilateral

## 2021-12-24 MED ORDER — ACETAMINOPHEN 500 MG PO TABS
1000.0000 mg | ORAL_TABLET | Freq: Once | ORAL | Status: AC
  Administered 2021-12-24: 1000 mg via ORAL
  Filled 2021-12-24: qty 2

## 2021-12-24 MED ORDER — MUPIROCIN 2 % EX OINT
TOPICAL_OINTMENT | CUTANEOUS | Status: DC | PRN
  Administered 2021-12-24: 1 via TOPICAL

## 2021-12-24 MED ORDER — DEXAMETHASONE SODIUM PHOSPHATE 10 MG/ML IJ SOLN
INTRAMUSCULAR | Status: AC
  Filled 2021-12-24: qty 1

## 2021-12-24 MED ORDER — LIDOCAINE-EPINEPHRINE 1 %-1:100000 IJ SOLN
INTRAMUSCULAR | Status: DC | PRN
Start: 2021-12-24 — End: 2021-12-24
  Administered 2021-12-24: 5 mL

## 2021-12-24 MED ORDER — FENTANYL CITRATE (PF) 250 MCG/5ML IJ SOLN
INTRAMUSCULAR | Status: DC | PRN
  Administered 2021-12-24: 25 ug via INTRAVENOUS
  Administered 2021-12-24: 100 ug via INTRAVENOUS

## 2021-12-24 MED ORDER — PROPOFOL 10 MG/ML IV BOLUS
INTRAVENOUS | Status: AC
  Filled 2021-12-24: qty 20

## 2021-12-24 MED ORDER — ONDANSETRON HCL 4 MG/2ML IJ SOLN
INTRAMUSCULAR | Status: DC | PRN
  Administered 2021-12-24: 4 mg via INTRAVENOUS

## 2021-12-24 MED ORDER — FENTANYL CITRATE (PF) 100 MCG/2ML IJ SOLN: 25.0000 ug | INTRAMUSCULAR | Status: DC | PRN

## 2021-12-24 MED ORDER — INSULIN ASPART 100 UNIT/ML IJ SOLN: 0.0000 [IU] | INTRAMUSCULAR | Status: DC | PRN

## 2021-12-24 MED ORDER — ROCURONIUM BROMIDE 10 MG/ML (PF) SYRINGE
PREFILLED_SYRINGE | INTRAVENOUS | Status: AC
  Filled 2021-12-24: qty 10

## 2021-12-24 MED ORDER — TRIAMCINOLONE ACETONIDE 40 MG/ML IJ SUSP
INTRAMUSCULAR | Status: AC
  Filled 2021-12-24: qty 5

## 2021-12-24 MED ORDER — PHENYLEPHRINE 40 MCG/ML (10ML) SYRINGE FOR IV PUSH (FOR BLOOD PRESSURE SUPPORT)
PREFILLED_SYRINGE | INTRAVENOUS | Status: AC
  Filled 2021-12-24: qty 10

## 2021-12-24 MED ORDER — DEXAMETHASONE SODIUM PHOSPHATE 10 MG/ML IJ SOLN
INTRAMUSCULAR | Status: DC | PRN
Start: 2021-12-24 — End: 2021-12-24
  Administered 2021-12-24: 10 mg via INTRAVENOUS

## 2021-12-24 MED ORDER — CEFAZOLIN SODIUM-DEXTROSE 2-4 GM/100ML-% IV SOLN
INTRAVENOUS | Status: AC
  Filled 2021-12-24: qty 100

## 2021-12-24 MED ORDER — CHLORHEXIDINE GLUCONATE 0.12 % MT SOLN
15.0000 mL | Freq: Once | OROMUCOSAL | Status: AC
  Administered 2021-12-24: 15 mL via OROMUCOSAL
  Filled 2021-12-24: qty 15

## 2021-12-24 MED ORDER — LACTATED RINGERS IV SOLN: INTRAVENOUS | Status: DC

## 2021-12-24 MED ORDER — METOPROLOL TARTRATE 12.5 MG HALF TABLET
25.0000 mg | ORAL_TABLET | Freq: Once | ORAL | Status: AC
  Administered 2021-12-24: 25 mg via ORAL
  Filled 2021-12-24: qty 2

## 2021-12-24 MED ORDER — EPHEDRINE SULFATE-NACL 50-0.9 MG/10ML-% IV SOSY
PREFILLED_SYRINGE | INTRAVENOUS | Status: DC | PRN
  Administered 2021-12-24: 10 mg via INTRAVENOUS

## 2021-12-24 MED ORDER — OXYMETAZOLINE HCL 0.05 % NA SOLN
NASAL | Status: AC
  Filled 2021-12-24: qty 30

## 2021-12-24 MED ORDER — ROCURONIUM BROMIDE 10 MG/ML (PF) SYRINGE
PREFILLED_SYRINGE | INTRAVENOUS | Status: DC | PRN
  Administered 2021-12-24: 60 mg via INTRAVENOUS

## 2021-12-24 MED ORDER — MIDAZOLAM HCL 2 MG/2ML IJ SOLN
INTRAMUSCULAR | Status: DC | PRN
  Administered 2021-12-24: 2 mg via INTRAVENOUS

## 2021-12-24 MED ORDER — PROPOFOL 10 MG/ML IV BOLUS
INTRAVENOUS | Status: DC | PRN
  Administered 2021-12-24: 150 mg via INTRAVENOUS
  Administered 2021-12-24: 20 mg via INTRAVENOUS

## 2021-12-24 MED ORDER — PHENYLEPHRINE 40 MCG/ML (10ML) SYRINGE FOR IV PUSH (FOR BLOOD PRESSURE SUPPORT)
PREFILLED_SYRINGE | INTRAVENOUS | Status: DC | PRN
Start: 2021-12-24 — End: 2021-12-24
  Administered 2021-12-24: 120 ug via INTRAVENOUS
  Administered 2021-12-24: 80 ug via INTRAVENOUS
  Administered 2021-12-24: 40 ug via INTRAVENOUS
  Administered 2021-12-24 (×2): 80 ug via INTRAVENOUS

## 2021-12-24 MED ORDER — LIDOCAINE-EPINEPHRINE 1 %-1:100000 IJ SOLN
INTRAMUSCULAR | Status: AC
  Filled 2021-12-24: qty 1

## 2021-12-24 MED ORDER — MUPIROCIN CALCIUM 2 % EX CREA
TOPICAL_CREAM | CUTANEOUS | Status: AC
  Filled 2021-12-24: qty 15

## 2021-12-24 MED ORDER — CEFAZOLIN SODIUM-DEXTROSE 2-3 GM-%(50ML) IV SOLR
INTRAVENOUS | Status: DC | PRN
  Administered 2021-12-24: 2 g via INTRAVENOUS

## 2021-12-24 MED ORDER — LIDOCAINE 2% (20 MG/ML) 5 ML SYRINGE
INTRAMUSCULAR | Status: DC | PRN
  Administered 2021-12-24: 100 mg via INTRAVENOUS

## 2021-12-24 MED ORDER — MIDAZOLAM HCL 2 MG/2ML IJ SOLN
INTRAMUSCULAR | Status: AC
  Filled 2021-12-24: qty 2

## 2021-12-24 MED ORDER — SODIUM CHLORIDE 0.9 % IR SOLN
Status: DC | PRN
  Administered 2021-12-24: 1000 mL

## 2021-12-24 MED ORDER — EPHEDRINE 5 MG/ML INJ
INTRAVENOUS | Status: AC
  Filled 2021-12-24: qty 5

## 2021-12-24 MED ORDER — MUPIROCIN 2 % EX OINT
TOPICAL_OINTMENT | CUTANEOUS | Status: AC
  Filled 2021-12-24: qty 22

## 2021-12-24 MED ORDER — ORAL CARE MOUTH RINSE: 15.0000 mL | Freq: Once | OROMUCOSAL | Status: AC

## 2021-12-24 MED ORDER — CEPHALEXIN 500 MG PO CAPS: 500.0000 mg | ORAL_CAPSULE | Freq: Three times a day (TID) | ORAL | 0 refills | Status: AC

## 2021-12-24 MED ORDER — ONDANSETRON HCL 4 MG/2ML IJ SOLN: 4.0000 mg | Freq: Once | INTRAMUSCULAR | Status: DC | PRN

## 2021-12-24 MED ORDER — LIDOCAINE 2% (20 MG/ML) 5 ML SYRINGE
INTRAMUSCULAR | Status: AC
  Filled 2021-12-24: qty 5

## 2021-12-24 MED ORDER — ONDANSETRON HCL 4 MG/2ML IJ SOLN
INTRAMUSCULAR | Status: AC
  Filled 2021-12-24: qty 2

## 2021-12-24 MED ORDER — PHENYLEPHRINE HCL-NACL 20-0.9 MG/250ML-% IV SOLN
INTRAVENOUS | Status: DC | PRN
  Administered 2021-12-24: 20 ug/min via INTRAVENOUS

## 2021-12-24 MED ORDER — ARTIFICIAL TEARS OPHTHALMIC OINT
TOPICAL_OINTMENT | OPHTHALMIC | Status: DC | PRN
  Administered 2021-12-24: 1 via OPHTHALMIC

## 2021-12-24 MED ORDER — OXYMETAZOLINE HCL 0.05 % NA SOLN
NASAL | Status: DC | PRN
  Administered 2021-12-24: 1

## 2021-12-24 MED ORDER — FENTANYL CITRATE (PF) 250 MCG/5ML IJ SOLN
INTRAMUSCULAR | Status: AC
  Filled 2021-12-24: qty 5

## 2021-12-24 MED ORDER — SUGAMMADEX SODIUM 200 MG/2ML IV SOLN
INTRAVENOUS | Status: DC | PRN
  Administered 2021-12-24: 200 mg via INTRAVENOUS

## 2021-12-24 SURGICAL SUPPLY — 49 items
ATTRACTOMAT 16X20 MAGNETIC DRP (DRAPES) IMPLANT
BAG COUNTER SPONGE SURGICOUNT (BAG) ×3 IMPLANT
BAG SPNG CNTER NS LX DISP (BAG) ×1
BLADE ROTATE RAD 40 4 M4 (BLADE) IMPLANT
BLADE ROTATE TRICUT 4X13 M4 (BLADE) ×3 IMPLANT
BLADE SURG 15 STRL LF DISP TIS (BLADE) IMPLANT
BLADE SURG 15 STRL SS (BLADE)
CANISTER SUCT 3000ML PPV (MISCELLANEOUS) ×6 IMPLANT
COAGULATOR SUCT SWTCH 10FR 6 (ELECTROSURGICAL) ×2 IMPLANT
DRAPE HALF SHEET 40X57 (DRAPES) IMPLANT
DRESSING NASAL KENNEDY 3.5X.9 (MISCELLANEOUS) IMPLANT
DRSG NASAL KENNEDY 3.5X.9 (MISCELLANEOUS)
DRSG NASOPORE 8CM (GAUZE/BANDAGES/DRESSINGS) ×2 IMPLANT
ELECT COATED BLADE 2.86 ST (ELECTRODE) IMPLANT
ELECT REM PT RETURN 9FT ADLT (ELECTROSURGICAL) ×3
ELECTRODE REM PT RTRN 9FT ADLT (ELECTROSURGICAL) ×2 IMPLANT
FILTER ARTHROSCOPY CONVERTOR (FILTER) ×3 IMPLANT
GLOVE SURG ENC TEXT LTX SZ7 (GLOVE) ×6 IMPLANT
GOWN STRL REUS W/ TWL LRG LVL3 (GOWN DISPOSABLE) ×4 IMPLANT
GOWN STRL REUS W/TWL LRG LVL3 (GOWN DISPOSABLE) ×6
IV NS 1000ML (IV SOLUTION) ×3
IV NS 1000ML BAXH (IV SOLUTION) ×2 IMPLANT
KIT BASIN OR (CUSTOM PROCEDURE TRAY) ×3 IMPLANT
KIT TURNOVER KIT B (KITS) ×3 IMPLANT
NDL 18GX1X1/2 (RX/OR ONLY) (NEEDLE) IMPLANT
NDL HYPO 25GX1X1/2 BEV (NEEDLE) ×1 IMPLANT
NEEDLE 18GX1X1/2 (RX/OR ONLY) (NEEDLE) IMPLANT
NEEDLE HYPO 25GX1X1/2 BEV (NEEDLE) ×3 IMPLANT
NS IRRIG 1000ML POUR BTL (IV SOLUTION) ×3 IMPLANT
PAD ARMBOARD 7.5X6 YLW CONV (MISCELLANEOUS) ×6 IMPLANT
PENCIL SMOKE EVACUATOR (MISCELLANEOUS) IMPLANT
SPECIMEN JAR SMALL (MISCELLANEOUS) ×3 IMPLANT
SPLINT NASAL DOYLE BI-VL (GAUZE/BANDAGES/DRESSINGS) IMPLANT
SPONGE NEURO XRAY DETECT 1X3 (DISPOSABLE) ×3 IMPLANT
SUT ETHILON 3 0 FSL (SUTURE) IMPLANT
SUT PLAIN 4 0 ~~LOC~~ 1 (SUTURE) IMPLANT
SWAB COLLECTION DEVICE MRSA (MISCELLANEOUS) IMPLANT
SWAB CULTURE ESWAB REG 1ML (MISCELLANEOUS) IMPLANT
SYR CONTROL 10ML LL (SYRINGE) ×3 IMPLANT
TOWEL GREEN STERILE FF (TOWEL DISPOSABLE) ×3 IMPLANT
TRACKER ENT INSTRUMENT (MISCELLANEOUS) ×3 IMPLANT
TRACKER ENT PATIENT (MISCELLANEOUS) ×3 IMPLANT
TRAP SPECIMEN MUCUS 40CC (MISCELLANEOUS) IMPLANT
TRAY ENT MC OR (CUSTOM PROCEDURE TRAY) ×3 IMPLANT
TUBE CONNECTING 12X1/4 (SUCTIONS) ×3 IMPLANT
TUBING EXTENTION W/L.L. (IV SETS) ×3 IMPLANT
TUBING STRAIGHTSHOT EPS 5PK (TUBING) ×3 IMPLANT
WATER STERILE IRR 1000ML POUR (IV SOLUTION) ×3 IMPLANT
WIPE INSTRUMENT VISIWIPE 73X73 (MISCELLANEOUS) ×3 IMPLANT

## 2021-12-24 NOTE — Transfer of Care (Signed)
Immediate Anesthesia Transfer of Care Note  Patient: Jeremy Tucker  Procedure(s) Performed: BILATERAL ENDOSCOPIC SINUS SURGERY WITH INTRANASAL POLYPECTOMY AND FUSION NAVIGATION (Bilateral: Nose)  Patient Location: PACU  Anesthesia Type:General  Level of Consciousness: drowsy  Airway & Oxygen Therapy: Patient Spontanous Breathing and Patient connected to face mask oxygen  Post-op Assessment: Report given to RN and Post -op Vital signs reviewed and stable  Post vital signs: Reviewed and stable  Last Vitals:  Vitals Value Taken Time  BP 166/84 12/24/21 0846  Temp    Pulse 64 12/24/21 0849  Resp 14 12/24/21 0849  SpO2 98 % 12/24/21 0849  Vitals shown include unvalidated device data.  Last Pain:  Vitals:   12/24/21 0609  TempSrc:   PainSc: 0-No pain         Complications: No notable events documented.

## 2021-12-24 NOTE — H&P (Signed)
Jeremy Tucker is an 71 y.o. male.   Chief Complaint: Nasal polyposis HPI: Hx of prg nasal obstruction  Past Medical History:  Diagnosis Date   Anxiety    BPH (benign prostatic hyperplasia)    BPH without obstruction/lower urinary tract symptoms    Depression    Diabetic peripheral neuropathy ([place]) 01/07/2019   Dyspnea    w/exertion   ED (erectile dysfunction)    Essential hypertension    GERD (gastroesophageal reflux disease)    Hiatal hernia    History of chronic bronchitis    per pt used inhaler as needed   History of DVT of lower extremity 2001   s/p  left TKA completed blood thinner treatment,  per pt no previous clot and none since   History of gastric polyp    per pt benign   History of kidney stones    Hyperlipidemia    Mild CAD cardiologist--- dr h. Tamala Julian   nuclear study 04-14-2017 normal , low risk, nuclear ef 53%;  cardiac cath 09-19-2018  midRCA 30% stenosis otherwise normal coronaries,  heavy AV calicification with decreased mobility with peak grandiant 13mmHg   Mild dilation of ascending aorta ([place])    per echo 01-13-2021  11mm   Nephrolithiasis    OA (osteoarthritis)    OSA (obstructive sleep apnea)    per pt refused cpap, intolerant;  moderate osa per study in epic 03-08-2015   PAF (paroxysmal atrial fibrillation) ([place])    followed by dr h. 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/2018   Prostate cancer [facility]) urologist--- dr dahlstedt/  oncology-- dr Tammi Klippel   first dx 03/ 2021,  Glesaon 3+4  PSA 6.95   Pulmonary nodule 2019   incidently finding on CTA 09-25-2018, stable   Right carpal tunnel syndrome 01/07/2019   Severe aortic stenosis    Type 2 diabetes mellitus ([place])    followed by pcp   (03-02-2021  pt stated does not check blood sugar at home)   Umbilical hernia     Past Surgical History:  Procedure Laterality Date   AORTIC VALVE REPLACEMENT N/A 09/20/2021   Procedure: AORTIC VALVE  REPLACEMENT (AVR) USING 25 MM INSPIRIS RESILIA  AORTIC VALVE;  Surgeon: [name], MD;  Location: [place] [facility];  Service: Open Heart Surgery;  Laterality: N/A;   ATRIAL FIBRILLATION ABLATION N/A 03/12/2015   Procedure: ATRIAL FIBRILLATION ABLATION;  Surgeon: [name], MD;  Location: [facility] CATH LAB;  Service: Cardiovascular;  Laterality: N/A;   CARDIAC CATHETERIZATION  1990's   "Dr. Melvern Banker", no blockages per pt   CARDIOVERSION N/A 02/25/2015   Procedure: CARDIOVERSION;  Surgeon: Sueanne Margarita, MD;  Location: [place];  Service: Cardiovascular;  Laterality: N/A;   CATARACT EXTRACTION W/ INTRAOCULAR LENS  IMPLANT, BILATERAL  2016   CLIPPING OF ATRIAL APPENDAGE Left 09/20/2021   Procedure: CLIPPING OF ATRIAL APPENDAGE USING ATRICURE 46 MM ATRICLIP;  Surgeon: [name], MD;  Location: [place];  Service: Open Heart Surgery;  Laterality: Left;   COLONOSCOPY  last one 2013  dr Earlean Shawl   CYSTOSCOPY N/A 03/08/2021   Procedure: CYSTOSCOPY BLADDER CALCULI EXTRACTION ;  Surgeon: Franchot Gallo, MD;  Location: [facility];  Service: Urology;  Laterality: N/A;   CYSTOSCOPY WITH RETROGRADE PYELOGRAM, URETEROSCOPY AND STENT PLACEMENT Left 04/24/2015   Procedure: URETHRAL MEATAL DILATION, CYSTOSCOPY WITH LEFT RETROGRADE PYELOGRAM, LEFT URETEROSCOPY, STONE BASKET EXTRACTION,  LEFT DOUBLE J  STENT PLACEMENT;  Surgeon: Carolan Clines, MD;  Location: [facility];  Service: Urology;  Laterality: Left;   EP IMPLANTABLE DEVICE N/A 09/15/2015   Procedure: Loop Recorder Insertion;  Surgeon: [name], MD;  Location: [place] CV LAB;  Service: Cardiovascular;  Laterality: N/A;   ESOPHAGOGASTRODUODENOSCOPY  last one 2017   EXTRACORPOREAL SHOCK WAVE LITHOTRIPSY Bilateral 04/02/2020   Procedure: EXTRACORPOREAL SHOCK WAVE LITHOTRIPSY (ESWL);  Surgeon: Cleon Gustin, MD;  Location: [facility];  Service: Urology;  Laterality: Bilateral;   EXTRACORPOREAL SHOCK WAVE LITHOTRIPSY  Left 04/06/2020   Procedure: EXTRACORPOREAL SHOCK WAVE LITHOTRIPSY (ESWL);  Surgeon: [name], MD;  Location: [facility];  Service: Urology;  Laterality: Left;   EYE SURGERY     HOLMIUM LASER APPLICATION Left 70/96/2836   Procedure: HOLMIUM LASER APPLICATION;  Surgeon: Carolan Clines, MD;  Location: [facility];  Service: Urology;  Laterality: Left;   implantable loopr recorder removal  02/22/2021   MDT LINQ removed in office by Dr Allred   JOINT REPLACEMENT     KNEE ARTHROSCOPY Bilateral "multiple times"   LEFT HEART CATH AND CORONARY ANGIOGRAPHY N/A 09/19/2018   Procedure: LEFT HEART CATH AND CORONARY ANGIOGRAPHY;  Surgeon: Belva Crome, MD;  Location: [place] CV LAB;  Service: Cardiovascular;  Laterality: N/A;   PERCUTANEOUS NEPHROSTOLITHOTOMY  1980s and 1990s   RADIOACTIVE SEED IMPLANT N/A 03/08/2021   Procedure: RADIOACTIVE SEED IMPLANT/BRACHYTHERAPY IMPLANT;  Surgeon: Franchot Gallo, MD;  Location: [facility];  Service: Urology;  Laterality: N/A;  90 MINS   RIGHT/LEFT HEART CATH AND CORONARY ANGIOGRAPHY N/A 08/04/2021   Procedure: RIGHT/LEFT HEART CATH AND CORONARY ANGIOGRAPHY;  Surgeon: Belva Crome, MD;  Location: [place] [facility] LAB;  Service: Cardiovascular;  Laterality: N/A;   SKIN BIOPSY Left 05/06/2020   Shave biopsy left posterior hand neurofiberoma.   SPACE OAR INSTILLATION N/A 03/08/2021   Procedure: SPACE OAR INSTILLATION;  Surgeon: Franchot Gallo, MD;  Location: [facility];  Service: Urology;  Laterality: N/A;   TEE WITHOUT CARDIOVERSION N/A 03/12/2015   Procedure: TRANSESOPHAGEAL ECHOCARDIOGRAM (TEE);  Surgeon: Pixie Casino, MD;  Location: [place];  Service: Cardiovascular;  Laterality: N/A;   TEE WITHOUT CARDIOVERSION N/A 09/20/2021   Procedure: TRANSESOPHAGEAL ECHOCARDIOGRAM (TEE);  Surgeon: [name], MD;  Location: [place];  Service: Open Heart Surgery;  Laterality: N/A;   TONSILLECTOMY   age 37   TOTAL KNEE ARTHROPLASTY Left 11-22-1999  @[facility]    TOTAL KNEE ARTHROPLASTY Right 10/30/2017   Procedure: RIGHT TOTAL KNEE ARTHROPLASTY;  Surgeon: Paralee Cancel, MD;  Location: [facility];  Service: Orthopedics;  Laterality: Right;  90 mins   UMBILICAL HERNIA REPAIR N/A 07/05/2021   Procedure: REPAIR INCARCERATED UMBILICAL HERNIA WITH MESH;  Surgeon: Georganna Skeans, MD;  Location: [facility] OR;  Service: General;  Laterality: N/A;    Family History  Problem Relation Age of Onset   Heart attack Mother 55       MI   Heart attack Father 83       ? MI versus trauma to head   Healthy Brother    Cancer Maternal Grandfather        had prostate removed   Breast cancer Neg Hx    Colon cancer Neg Hx    Pancreatic cancer Neg Hx    Prostate cancer Neg Hx    Social History:  reports that he has never smoked. He has never used smokeless tobacco. He reports current alcohol use. He  reports that he does not use drugs.  Allergies:  Allergies  Allergen Reactions   Oxycodone Itching    Medications Prior to Admission  Medication Sig Dispense Refill   acetaminophen (TYLENOL) 500 MG tablet Take 1,000 mg by mouth every 8 (eight) hours as needed for mild pain.      albuterol (VENTOLIN HFA) 108 (90 Base) MCG/ACT inhaler INHALE 2 PUFFS INTO THE LUNGS EVERY 6 HOURS AS NEEDED FOR WHEEZING OR SHORTNESS OF BREATH 54 g 0   allopurinol (ZYLOPRIM) 300 MG tablet Take 1 tablet (300 mg total) by mouth daily. 90 tablet 3   amiodarone (PACERONE) 200 MG tablet Take 1 tablet (200 mg total) by mouth daily. (Patient taking differently: Take 100 mg by mouth 2 (two) times daily.) 90 tablet 3   ammonium lactate (LAC-HYDRIN) 12 % lotion Apply 1 application topically daily as needed for dry skin.     aspirin EC 81 MG tablet Take 1 tablet (81 mg total) by mouth daily. Swallow whole. 150 tablet 2   atorvastatin (LIPITOR) 40 MG tablet Take 1 tablet (40 mg total) by mouth at bedtime. 90 tablet 3   buPROPion (WELLBUTRIN XL) 300 MG 24 hr  tablet Take 1 tablet (300 mg total) by mouth daily. (Patient taking differently: Take 150 mg by mouth in the morning.) 90 tablet 1   ELIQUIS 5 MG TABS tablet TAKE ONE TABLET BY MOUTH TWICE A DAY 180 tablet 1   fluticasone (FLONASE) 50 MCG/ACT nasal spray Place 2 sprays into both nostrils daily.     fluticasone-salmeterol (ADVAIR) 250-50 MCG/ACT AEPB INHALE 1 PUFF INTO THE LUNGS TWO TIMES A DAY (Patient taking differently: Inhale 1 puff into the lungs 2 (two) times daily as needed (asthma).) 60 each 5   loratadine (CLARITIN) 10 MG tablet Take 10 mg by mouth in the morning.     metFORMIN (GLUCOPHAGE) 500 MG tablet Take 1 tablet (500 mg total) by mouth 2 (two) times daily with a meal. 180 tablet 3   metoprolol tartrate (LOPRESSOR) 25 MG tablet Take 1 tablet (25 mg total) by mouth 2 (two) times daily. 60 tablet 3   Multiple Vitamins-Minerals (MULTIVITAMIN WITH MINERALS) tablet Take 1 tablet by mouth daily.     Omega-3 Fatty Acids (FISH OIL) 1000 MG CAPS Take 1,000 mg by mouth daily.     omeprazole (PRILOSEC) 20 MG capsule Take 20 mg by mouth daily.     sertraline (ZOLOFT) 50 MG tablet Take 1 tablet (50 mg total) by mouth daily. 90 tablet 1   sildenafil (VIAGRA) 100 MG tablet Take 1 tablet (100 mg total) by mouth daily as needed for erectile dysfunction. 20 tablet 1   tamsulosin (FLOMAX) 0.4 MG CAPS capsule Take 0.4 mg by mouth at bedtime.     HYDROcodone-acetaminophen (NORCO/VICODIN) 5-325 MG tablet Take 1 tablet by mouth every 4 (four) hours as needed for moderate pain. (Patient not taking: Reported on 12/13/2021) 30 tablet 0   LORazepam (ATIVAN) 0.5 MG tablet TAKE 1 TABLET TWICE DAILY AS NEEDED FOR ANXIETY. 20 tablet 0   olopatadine (PATANOL) 0.1 % ophthalmic solution Place 1 drop into both eyes daily as needed for allergies (tearing).      traZODone (DESYREL) 50 MG tablet TAKE 1/2 TO 1 TABLETS BY MOUTH AS NEEDED FOR SLEEP 30 tablet 0   zolpidem (AMBIEN) 5 MG tablet Take 1 tablet (5 mg total) by mouth  at bedtime as needed for sleep. 15 tablet 1    Results for orders placed or  performed during the hospital encounter of 12/24/21 (from the past 48 hour(s))  Glucose, capillary     Status: Abnormal   Collection Time: 12/24/21  5:51 AM  Result Value Ref Range   Glucose-Capillary 161 (H) 70 - 99 mg/dL    Comment: Glucose reference range applies only to samples taken after fasting for at least 8 hours.   No results found.  Review of Systems  HENT:  Positive for congestion.   Respiratory:  Positive for apnea.    Blood pressure (!) 179/76, pulse 63, temperature 98.1 F (36.7 C), temperature source Oral, resp. rate 18, height 6' (1.829 m), weight 108.9 kg, SpO2 98 %. Physical Exam Constitutional:      Appearance: He is normal weight.  HENT:     Nose:     Comments: Nasal Polyposis Cardiovascular:     Rate and Rhythm: Normal rate.  Pulmonary:     Effort: Pulmonary effort is normal.  Musculoskeletal:     Cervical back: Normal range of motion.  Neurological:     Mental Status: He is alert.     Assessment/Plan Adm for OP ESS  Jerrell Belfast, MD 12/24/2021, 7:25 AM

## 2021-12-24 NOTE — Op Note (Signed)
Operative Note:  ENDOSCOPIC SINUS SURGERY WITH NAVIGATION      Patient: Jeremy Tucker record number: [id number]  Date:12/24/2021  Pre-operative Indications: 1.  Chronic Sinusitis and nasal polyposis       Postoperative Indications: Same  Surgical Procedure: 1.  Bilateral endoscopic sinus surgery with intraoperative computer-assisted navigation consisting of: Bilateral nasal polypectomy      Anesthesia: GET  Surgeon: Delsa Bern, M.D.  Complications: None  EBL: 100 cc  Findings: Patient with significant bilateral nasal polyposis with near complete nasal airway obstruction on the right.  Attachment for nasal polyps along the superior turbinate and nasal septum bilaterally.  Bilateral NasoPore packing placed in the superior meatus at the conclusion of the surgical procedure.     Brief History: The patient is a 71 y.o. male with a history of chronic sinusitis and chronic nasal polyposis.  The patient has a history of nasal airway obstruction and physical examination showed a large intranasal polyp.  CT scanning showed bilateral nasal polyposis involving the superior meatus and nasal passageway.  Despite appropriate medical therapy the patient continues to have ongoing symptoms. Given the patient's history and findings, the above surgical procedures were recommended, risks and benefits were discussed in detail with the patient may understand and agree with our plan for surgery which is scheduled at [place] under general anesthesia as an outpatient.  Surgical Procedure: The patient is brought to the operating room on 12/24/2021 and placed in supine position on the operating table. General endotracheal anesthesia was established without difficulty. When the patient was adequately anesthetized, surgical timeout was performed with correct identification of the patient and the surgical procedure. The patient's nose was then injected with 5 cc of 1% lidocaine 1:100,000 dilution  epinephrine which was injected in a submucosal fashion. The patient's nose was then packed with Afrin-soaked cottonoid pledgets were left in place for approximately 10 minutes to allow for vasoconstriction and hemostasis.  The X[employer] Fusion navigation headgear was applied in anatomic and surgical landmarks were identified and confirmed, navigation was used throughout the sinus component of the surgical procedure.  With the patient prepped draped and prepared for surgery the 0 degree nasal endoscope was used to examine the right nasal passageway.  The patient had a large soft tissue mass consistent with nasal polyposis obstructing the entire nasal passageway.  Using the straight microdebrider and 0 degree endoscope with navigation the polyp was resected from inferior to superior the attachment of the polyp was along the superior turbinate and superior aspect of the right nasal septum.  The entire polyp was resected and the attachment was cauterized with monopolar suction cautery at 63 W.  NasoPore nasal packing was placed in the superior meatus after the application of Bactroban ointment.    The patient's left nasal passageway was then inspected and the patient was found to have a large soft tissue mass consistent with nasal polyp obstructing the superior meatus on the left.  Using the straight microdebrider under direct visualization with navigation the entire polyp was resected.  Again attachments along the superior turbinate and nasal septum.  With the polyp resected nasal cautery was used at the attachment site to reduce postoperative bleeding.  NasoPore packing was then placed after the application of Bactroban ointment.  Surgical sponge count was correct. An oral gastric tube was passed and the stomach contents were aspirated. Patient was awakened from anesthetic and transferred from the operating room to the recovery room in stable condition. There were  no complications and blood loss was 100  cc.   Delsa Bern, M.D. [facility] 12/24/2021

## 2021-12-24 NOTE — Anesthesia Procedure Notes (Signed)
Procedure Name: Intubation Date/Time: 12/24/2021 7:44 AM Performed by: Vonna Drafts, CRNA Pre-anesthesia Checklist: Patient identified, Emergency Drugs available, Suction available and Patient being monitored Patient Re-evaluated:Patient Re-evaluated prior to induction Oxygen Delivery Method: Circle system utilized Preoxygenation: Pre-oxygenation with 100% oxygen Induction Type: IV induction Ventilation: Mask ventilation without difficulty Laryngoscope Size: Mac and 4 Grade View: Grade II Tube type: Oral Tube size: 7.5 mm Number of attempts: 1 Airway Equipment and Method: Stylet and Oral airway Placement Confirmation: ETT inserted through vocal cords under direct vision, positive ETCO2 and breath sounds checked- equal and bilateral Secured at: 23 cm Tube secured with: Tape Dental Injury: Teeth and Oropharynx as per pre-operative assessment

## 2021-12-25 NOTE — Anesthesia Postprocedure Evaluation (Signed)
Anesthesia Post Note  Patient: Jeremy Tucker  Procedure(s) Performed: BILATERAL ENDOSCOPIC SINUS SURGERY WITH INTRANASAL POLYPECTOMY AND FUSION NAVIGATION (Bilateral: Nose)     Patient location during evaluation: PACU Anesthesia Type: General Level of consciousness: awake and alert Pain management: pain level controlled Vital Signs Assessment: post-procedure vital signs reviewed and stable Respiratory status: spontaneous breathing, nonlabored ventilation, respiratory function stable and patient connected to nasal cannula oxygen Cardiovascular status: blood pressure returned to baseline and stable Postop Assessment: no apparent nausea or vomiting Anesthetic complications: no   No notable events documented.  Last Vitals:  Vitals:   12/24/21 0618 12/24/21 0915  BP: (!) 179/76 (!) 154/80  Pulse: 63 62  Resp:  16  Temp:  36.8 C  SpO2:  92%    Last Pain:  Vitals:   12/24/21 0915  TempSrc:   PainSc: 0-No pain                 Santa Lighter

## 2021-12-26 ENCOUNTER — Encounter (HOSPITAL_COMMUNITY): Payer: Self-pay | Admitting: Otolaryngology

## 2021-12-26 NOTE — Progress Notes (Signed)
Cardiology Office Note:    Date:  12/29/2021   ID:  Jeremy Tucker, DOB 07-Jan-1951, MRN [id number]  PCP:  Denita Lung, MD  Cardiologist:  [name], MD   Referring MD: Denita Lung, MD   Chief Complaint  Patient presents with   Atrial Fibrillation   Congestive Heart Failure   Cardiac Valve Problem    History of Present Illness:    Jeremy Tucker is a 71 y.o. male with a hx of hypertension, hyperlipidemia, diabetes with peripheral neuropathy, paroxysmal atrial fibrillation status post ablation on Eliquis, OSA intolerant of CPAP, prostate cancer status post radioactive seed implant in April 2022, and aortic stenosis treated with SAVR [name] and LAA occlusion 09/20/2021.   He feels better.  He was able to successfully complete his [place].  He had no difficulty, had a great time, and gaining weight.  He denies orthopnea, PND, lower extremity swelling.  No episodes of atrial fibrillation since he has spontaneous conversion on amiodarone loading.  We will need to determine what to do with amiodarone.  For the time being I will continue amiodarone until the next office visit in 3 months at which time we may start weaning/considering discontinuation.  May need to engage electrophysiology to help [place] figure out a strategy which might include a touchup ablation if he has recurrent atrial fibrillation off amiodarone.  Past Medical History:  Diagnosis Date   Anxiety    BPH (benign prostatic hyperplasia)    BPH without obstruction/lower urinary tract symptoms    Depression    Diabetic peripheral neuropathy ([place]) 01/07/2019   Dyspnea    w/exertion   ED (erectile dysfunction)    Essential hypertension    GERD (gastroesophageal reflux disease)    Hiatal hernia    History of chronic bronchitis    per pt used inhaler as needed   History of DVT of lower extremity 2001   s/p  left TKA completed blood thinner treatment,  per pt no previous clot and none since    History of gastric polyp    per pt benign   History of kidney stones    Hyperlipidemia    Mild CAD cardiologist--- dr h. Tamala Julian   nuclear study 04-14-2017 normal , low risk, nuclear ef 53%;  cardiac cath 09-19-2018  midRCA 30% stenosis otherwise normal coronaries,  heavy AV calicification with decreased mobility with peak grandiant 15mmHg   Mild dilation of ascending aorta ([place])    per echo 01-13-2021  27mm   Nephrolithiasis    OA (osteoarthritis)    OSA (obstructive sleep apnea)    per pt refused cpap, intolerant;  moderate osa per study in epic 03-08-2015   PAF (paroxysmal atrial fibrillation) ([place])    followed by dr h. 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/2018   Prostate cancer [facility]) urologist--- dr dahlstedt/  oncology-- dr Tammi Klippel   first dx 03/ 2021,  Gles[place]on 3+4  PSA 6.95   Pulmonary nodule 2019   incidently finding on CTA 09-25-2018, stable   Right carpal tunnel syndrome 01/07/2019   Severe aortic stenosis    Type 2 diabetes mellitus ([place])    followed by pcp   (03-02-2021  pt stated does not check blood sugar at home)   Umbilical hernia     Past Surgical History:  Procedure Laterality Date   AORTIC VALVE REPLACEMENT N/A 09/20/2021   Procedure:  AORTIC VALVE REPLACEMENT (AVR) USING 25 MM INSPIRIS RESILIA  AORTIC VALVE;  Surgeon: [name], MD;  Location: [place] [facility];  Service: Open Heart Surgery;  Laterality: N/A;   ATRIAL FIBRILLATION ABLATION N/A 03/12/2015   Procedure: ATRIAL FIBRILLATION ABLATION;  Surgeon: [name], MD;  Location: [facility] CATH LAB;  Service: Cardiovascular;  Laterality: N/A;   CARDIAC CATHETERIZATION  1990's   "Dr. Melvern Banker", no blockages per pt   CARDIOVERSION N/A 02/25/2015   Procedure: CARDIOVERSION;  Surgeon: Sueanne Margarita, MD;  Location: [place];  Service: Cardiovascular;  Laterality: N/A;   CATARACT EXTRACTION W/ INTRAOCULAR LENS  IMPLANT, BILATERAL  2016   CLIPPING OF ATRIAL  APPENDAGE Left 09/20/2021   Procedure: CLIPPING OF ATRIAL APPENDAGE USING ATRICURE 48 MM ATRICLIP;  Surgeon: [name], MD;  Location: [place];  Service: Open Heart Surgery;  Laterality: Left;   COLONOSCOPY  last one 2013  dr Earlean Shawl   CYSTOSCOPY N/A 03/08/2021   Procedure: CYSTOSCOPY BLADDER CALCULI EXTRACTION ;  Surgeon: Franchot Gallo, MD;  Location: [facility];  Service: Urology;  Laterality: N/A;   CYSTOSCOPY WITH RETROGRADE PYELOGRAM, URETEROSCOPY AND STENT PLACEMENT Left 04/24/2015   Procedure: URETHRAL MEATAL DILATION, CYSTOSCOPY WITH LEFT RETROGRADE PYELOGRAM, LEFT URETEROSCOPY, STONE BASKET EXTRACTION,  LEFT DOUBLE J STENT PLACEMENT;  Surgeon: Carolan Clines, MD;  Location[facility];  Service: Urology;  Laterality: Left;   EP IMPLANTABLE DEVICE N/A 09/15/2015   Procedure: Loop Recorder Insertion;  Surgeon: [name], MD;  Location: [place] CV LAB;  Service: Cardiovascular;  Laterality: N/A;   ESOPHAGOGASTRODUODENOSCOPY  last one 2017   EXTRACORPOREAL SHOCK WAVE LITHOTRIPSY Bilateral 04/02/2020   Procedure: EXTRACORPOREAL SHOCK WAVE LITHOTRIPSY (ESWL);  Surgeon: Cleon Gustin, MD;  Location: [facility];  Service: Urology;  Laterality: Bilateral;   EXTRACORPOREAL SHOCK WAVE LITHOTRIPSY Left 04/06/2020   Procedure: EXTRACORPOREAL SHOCK WAVE LITHOTRIPSY (ESWL);  Surgeon: [name], MD;  Location: [facility];  Service: Urology;  Laterality: Left;   EYE SURGERY     HOLMIUM LASER APPLICATION Left 22/12/5425   Procedure: HOLMIUM LASER APPLICATION;  Surgeon: Carolan Clines, MD;  [facility];  Service: Urology;  Laterality: Left;   implantable loopr recorder removal  02/22/2021   MDT LINQ removed in office by Dr Allred   JOINT REPLACEMENT     KNEE ARTHROSCOPY Bilateral "multiple times"   LEFT HEART CATH AND CORONARY ANGIOGRAPHY N/A 09/19/2018   Procedure: LEFT HEART CATH AND CORONARY ANGIOGRAPHY;  Surgeon:  Belva Crome, MD;  Location: [place] CV LAB;  Service: Cardiovascular;  Laterality: N/A;   PERCUTANEOUS NEPHROSTOLITHOTOMY  1980s and 1990s   RADIOACTIVE SEED IMPLANT N/A 03/08/2021   Procedure: RADIOACTIVE SEED IMPLANT/BRACHYTHERAPY IMPLANT;  Surgeon: Franchot Gallo, MD;  [facility];  Service: Urology;  Laterality: N/A;  90 MINS   RIGHT/LEFT HEART CATH AND CORONARY ANGIOGRAPHY N/A 08/04/2021   Procedure: RIGHT/LEFT HEART CATH AND CORONARY ANGIOGRAPHY;  Surgeon: Belva Crome, MD;  Location: [place] [facility] LAB;  Service: Cardiovascular;  Laterality: N/A;   SINUS ENDO WITH FUSION Bilateral 12/24/2021   Procedure: BILATERAL ENDOSCOPIC SINUS SURGERY WITH INTRANASAL POLYPECTOMY AND FUSION NAVIGATION;  Surgeon: Jerrell Belfast, MD;  Location: [place];  Service: ENT;  Laterality: Bilateral;   SKIN BIOPSY Left 05/06/2020   Shave biopsy left posterior hand neurofiberoma.   SPACE OAR INSTILLATION N/A 03/08/2021   Procedure: SPACE OAR INSTILLATION;  Surgeon: Franchot Gallo, MD;  [facility];  Service: Urology;  Laterality: N/A;   TEE WITHOUT CARDIOVERSION N/A 03/12/2015   Procedure: TRANSESOPHAGEAL ECHOCARDIOGRAM (TEE);  Surgeon: Pixie Casino, MD;  Location: [place];  Service: Cardiovascular;  Laterality: N/A;   TEE WITHOUT CARDIOVERSION N/A 09/20/2021   Procedure: TRANSESOPHAGEAL ECHOCARDIOGRAM (TEE);  Surgeon: [name], MD;  Location: [place];  Service: Open Heart Surgery;  Laterality: N/A;   TONSILLECTOMY  age 41   TOTAL KNEE ARTHROPLASTY Left 11-22-1999  @[facility]    TOTAL KNEE ARTHROPLASTY Right 10/30/2017   Procedure: RIGHT TOTAL KNEE ARTHROPLASTY;  Surgeon: Paralee Cancel, MD;  [facility];  Service: Orthopedics;  Laterality: Right;  90 mins   UMBILICAL HERNIA REPAIR N/A 07/05/2021   Procedure: REPAIR INCARCERATED UMBILICAL HERNIA WITH MESH;  Surgeon: Georganna Skeans, MD;  Location: [place];  Service: General;  Laterality: N/A;     Current Medications: Current Meds  Medication Sig   acetaminophen (TYLENOL) 500 MG tablet Take 1,000 mg by mouth every 8 (eight) hours as needed for mild pain.    albuterol (VENTOLIN HFA) 108 (90 Base) MCG/ACT inhaler INHALE 2 PUFFS INTO THE LUNGS EVERY 6 HOURS AS NEEDED FOR WHEEZING OR SHORTNESS OF BREATH   allopurinol (ZYLOPRIM) 300 MG tablet Take 1 tablet (300 mg total) by mouth daily.   amiodarone (PACERONE) 200 MG tablet Take 200 mg by mouth daily. Pt take 1/2 tablet 100 mg in the AM, and 1/2 tablet 100 mg in the PM   ammonium lactate (LAC-HYDRIN) 12 % lotion Apply 1 application topically daily as needed for dry skin.   aspirin EC 81 MG tablet Take 1 tablet (81 mg total) by mouth daily. Swallow whole.   atorvastatin (LIPITOR) 40 MG tablet Take 1 tablet (40 mg total) by mouth at bedtime.   buPROPion (WELLBUTRIN XL) 300 MG 24 hr tablet Take 1 tablet (300 mg total) by mouth daily. (Patient taking differently: Take 150 mg by mouth in the morning.)   cephALEXin (KEFLEX) 500 MG capsule Take 1 capsule (500 mg total) by mouth 3 (three) times daily for 7 days.   ELIQUIS 5 MG TABS tablet TAKE ONE TABLET BY MOUTH TWICE A DAY   fluticasone (FLONASE) 50 MCG/ACT nasal spray Place 2 sprays into both nostrils daily.   fluticasone-salmeterol (ADVAIR) 250-50 MCG/ACT AEPB INHALE 1 PUFF INTO THE LUNGS TWO TIMES A DAY (Patient taking differently: Inhale 1 puff into the lungs 2 (two) times daily as needed (asthma).)   HYDROcodone-acetaminophen (NORCO/VICODIN) 5-325 MG tablet Take 1 tablet by mouth every 4 (four) hours as needed for moderate pain.   loratadine (CLARITIN) 10 MG tablet Take 10 mg by mouth in the morning.   LORazepam (ATIVAN) 0.5 MG tablet TAKE 1 TABLET TWICE DAILY AS NEEDED FOR ANXIETY.   metFORMIN (GLUCOPHAGE) 500 MG tablet Take 1 tablet (500 mg total) by mouth 2 (two) times daily with a meal.   metoprolol tartrate (LOPRESSOR) 25 MG tablet Take 1 tablet (25 mg total) by mouth 2 (two) times  daily.   Multiple Vitamins-Minerals (MULTIVITAMIN WITH MINERALS) tablet Take 1 tablet by mouth daily.   olopatadine (PATANOL) 0.1 % ophthalmic solution Place 1 drop into both eyes daily as needed for allergies (tearing).    Omega-3 Fatty Acids (FISH OIL) 1000 MG CAPS Take 1,000 mg by mouth daily.   omeprazole (PRILOSEC) 20 MG capsule Take 20 mg by mouth daily.   sertraline (ZOLOFT) 50 MG tablet Take 1 tablet (50 mg total) by mouth daily.   sildenafil (VIAGRA) 100 MG tablet Take  1 tablet (100 mg total) by mouth daily as needed for erectile dysfunction.   tamsulosin (FLOMAX) 0.4 MG CAPS capsule Take 0.4 mg by mouth at bedtime.   traZODone (DESYREL) 50 MG tablet TAKE 1/2 TO 1 TABLETS BY MOUTH AS NEEDED FOR SLEEP   zolpidem (AMBIEN) 5 MG tablet Take 1 tablet (5 mg total) by mouth at bedtime as needed for sleep.     Allergies:   Oxycodone   Social History   Socioeconomic History   Marital status: Married    Spouse name: Not on file   Number of children: 1   Years of education: Not on file   Highest education level: Some college, no degree  Occupational History   Occupation: Retired  Tobacco Use   Smoking status: Never   Smokeless tobacco: Never  Vaping Use   Vaping Use: Never used  Substance and Sexual Activity   Alcohol use: Yes    Comment: very rare   Drug use: Never   Sexual activity: Yes  Other Topics Concern   Not on file  Social History Narrative   Pt lives in [place] with spouse.  71 yo son is health.  Second son died in car accident.      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 There is no history of Breast cancer, Colon cancer, Pancreatic cancer, or Prostate cancer.  ROS:   Please see the history of present illness.    Some discomfort in his knees.  Has a cough that is worse than it was previously.  All other systems reviewed and are negative.  EKGs/Labs/Other Studies Reviewed:    The following studies were reviewed today:  ECHOCARDIOGRAM 10/2021: IMPRESSIONS     1. Left ventricular ejection fraction, by estimation, is 60 to 65%. The  left ventricle has normal function. The left ventricle has no regional  wall motion abnormalities. There is mild asymmetric left ventricular  hypertrophy of the basal and septal  segments. Left ventricular diastolic parameters are indeterminate.   2. Right ventricular systolic function is normal. The right ventricular  size is normal. There is normal pulmonary artery systolic pressure. The  estimated right ventricular systolic pressure is 03.5 mmHg.   3. The mitral valve is normal in structure. Mild mitral valve  regurgitation. No evidence of mitral stenosis.   4. The aortic valve has been repaired/replaced. Aortic valve  regurgitation is not visualized. No aortic stenosis is present. There is a  25 mm Inspiris Resilia valve present in the aortic position. Echo findings  are consistent with normal structure and  function of the aortic valve prosthesis. Aortic valve area, by VTI  measures 2.10 cm. Aortic valve mean gradient measures 7.6 mmHg. Aortic  valve Vmax measures 1.99 m/s.   5. The inferior vena cava is normal in size with greater than 50%  respiratory variability, suggesting right atrial pressure of 3 mmHg.   EKG:  EKG not repeated  Recent Labs: 07/27/2021: TSH 2.280 09/16/2021: ALT  38 09/21/2021: Magnesium 2.1 10/01/2021: NT-Pro BNP 1,168 12/15/2021: BUN 17; Creatinine, Ser 0.91; Hemoglobin 13.3; Platelets 161; Potassium 4.6;  Sodium 137  Recent Lipid Panel    Component Value Date/Time   CHOL 137 04/01/2021 1002   TRIG 166 (H) 04/01/2021 1002   HDL 43 04/01/2021 1002   CHOLHDL 3.2 04/01/2021 1002   CHOLHDL 3.6 08/16/2016 0925   VLDL 28 08/16/2016 0925   LDLCALC 66 04/01/2021 1002    Physical Exam:    VS:  BP 110/70    Pulse (!) 59    Ht 6' (1.829 m)    Wt 241 lb (109.3 kg)    SpO2 96%    BMI 32.69 kg/m     Wt Readings from Last 3 Encounters:  12/29/21 241 lb (109.3 kg)  12/24/21 240 lb (108.9 kg)  12/15/21 244 lb 8 oz (110.9 kg)     GEN: Appears healthy. No acute distress HEENT: Normal NECK: No JVD. LYMPHATICS: No lymphadenopathy CARDIAC: No murmur. RRR no gallop, or edema. VASCULAR:  Normal Pulses. No bruits. RESPIRATORY:  Clear to auscultation without rales, wheezing or rhonchi  ABDOMEN: Soft, non-tender, non-distended, No pulsatile mass, MUSCULOSKELETAL: No deformity  SKIN: Warm and dry NEUROLOGIC:  Alert and oriented x 3 PSYCHIATRIC:  Normal affect   ASSESSMENT:    1. S/P AVR   2. Paroxysmal atrial fibrillation (HCC)   3. Aortic atherosclerosis ([place])   4. Ascending aorta dilation (HCC)   5. Essential hypertension   6. OSA (obstructive sleep apnea)   7. Chronic anticoagulation    PLAN:    In order of problems listed above:  Normal valve function. Has had prior ablation.  Had left atrial appendage occlusion during aortic valve replacement surgery.  Developed persistent symptomatic atrial fibrillation after bypass surgery with diastolic heart failure.  Had spontaneous conversion on amiodarone.  Plan to continue Amio for at least an additional 3 months and then probably discontinue.  We will need to raise question with electrophysiology of whether a touchup ablation would be helpful. Continue aggressive preventative measures including lipid-lowering. Monitor. Excellent blood pressure control Continue CPAP Continue Eliquis therapy   30-month follow-up to entertain management of  atrial fibrillation antiarrhythmic drug, amiodarone.  Will discuss with EP.  Raise question of whether touchup ablation would be helpful.   Medication Adjustments/Labs and Tests Ordered: Current medicines are reviewed at length with the patient today.  Concerns regarding medicines are outlined above.  No orders of the defined types were placed in this encounter.  No orders of the defined types were placed in this encounter.   Patient Instructions  Medication Instructions:  Your physician recommends that you continue on your current medications as directed. Please refer to the Current Medication list given to you today.  *If you need a refill on your cardiac medications before your next appointment, please call your pharmacy*   Lab Work: None If you have labs (blood work) drawn today and your tests are completely normal, you will receive your results only by: [place] (if you have MyChart) OR A paper copy in the mail If you have any lab test that is abnormal or we need to change your treatment, we will call you to review the results.   Testing/Procedures: None   Follow-Up: At [facility], you and your health needs are our priority.  As part of our continuing mission to provide you with exceptional heart care, we have created designated Provider Care Teams.  These Care Teams include your primary  Cardiologist (physician) and Advanced Practice Providers (APPs -  Physician Assistants and Nurse Practitioners) who all work together to provide you with the care you need, when you need it.  We recommend signing up for the patient portal called "MyChart".  Sign up information is provided on this After Visit Summary.  MyChart is used to connect with patients for Virtual Visits (Telemedicine).  Patients are able to view lab/test results, encounter notes, upcoming appointments, etc.  Non-urgent messages can be sent to your provider as well.   To learn more about what you can do with  MyChart, go to [email or web address].    Your next appointment:   3 month(s)  The format for your next appointment:   In Person  Provider:   [name], MD     Other Instructions     Signed, [name], MD  12/29/2021 11:35 AM    [place]

## 2021-12-27 LAB — SURGICAL PATHOLOGY

## 2021-12-29 ENCOUNTER — Encounter: Payer: Self-pay | Admitting: Interventional Cardiology

## 2021-12-29 ENCOUNTER — Other Ambulatory Visit: Payer: Self-pay

## 2021-12-29 ENCOUNTER — Ambulatory Visit: Payer: PPO | Admitting: Interventional Cardiology

## 2021-12-29 VITALS — BP 110/70 | HR 59 | Ht 72.0 in | Wt 241.0 lb

## 2021-12-29 DIAGNOSIS — I10 Essential (primary) hypertension: Secondary | ICD-10-CM

## 2021-12-29 DIAGNOSIS — Z952 Presence of prosthetic heart valve: Secondary | ICD-10-CM

## 2021-12-29 DIAGNOSIS — I77810 Thoracic aortic ectasia: Secondary | ICD-10-CM | POA: Diagnosis not present

## 2021-12-29 DIAGNOSIS — G4733 Obstructive sleep apnea (adult) (pediatric): Secondary | ICD-10-CM

## 2021-12-29 DIAGNOSIS — Z7901 Long term (current) use of anticoagulants: Secondary | ICD-10-CM | POA: Diagnosis not present

## 2021-12-29 DIAGNOSIS — I480 Paroxysmal atrial fibrillation: Secondary | ICD-10-CM

## 2021-12-29 DIAGNOSIS — I700 Atherosclerosis of aorta: Secondary | ICD-10-CM | POA: Diagnosis not present

## 2021-12-29 NOTE — Patient Instructions (Signed)
Medication Instructions:  Your physician recommends that you continue on your current medications as directed. Please refer to the Current Medication list given to you today.  *If you need a refill on your cardiac medications before your next appointment, please call your pharmacy*   Lab Work: None If you have labs (blood work) drawn today and your tests are completely normal, you will receive your results only by: [place] (if you have MyChart) OR A paper copy in the mail If you have any lab test that is abnormal or we need to change your treatment, we will call you to review the results.   Testing/Procedures: None   Follow-Up: At [facility], you and your health needs are our priority.  As part of our continuing mission to provide you with exceptional heart care, we have created designated Provider Care Teams.  These Care Teams include your primary Cardiologist (physician) and Advanced Practice Providers (APPs -  Physician Assistants and Nurse Practitioners) who all work together to provide you with the care you need, when you need it.  We recommend signing up for the patient portal called "MyChart".  Sign up information is provided on this After Visit Summary.  MyChart is used to connect with patients for Virtual Visits (Telemedicine).  Patients are able to view lab/test results, encounter notes, upcoming appointments, etc.  Non-urgent messages can be sent to your provider as well.   To learn more about what you can do with MyChart, go to [email or web address].    Your next appointment:   3 month(s)  The format for your next appointment:   In Person  Provider:   [name], MD     Other Instructions

## 2022-01-11 ENCOUNTER — Telehealth (HOSPITAL_COMMUNITY): Payer: Self-pay

## 2022-01-11 NOTE — Telephone Encounter (Signed)
Called and spoke with pt in regards to CR, pt stated he was out of town at the time and would like a call back next Wed.

## 2022-01-12 ENCOUNTER — Other Ambulatory Visit: Payer: Self-pay

## 2022-01-12 MED ORDER — APIXABAN 5 MG PO TABS: 5.0000 mg | ORAL_TABLET | Freq: Two times a day (BID) | ORAL | 1 refills | Status: DC

## 2022-01-12 NOTE — Telephone Encounter (Signed)
Prescription refill request for Eliquis received. ?Indication: Afib  ?Last office visit: 12/29/21 Tamala Julian)  ?Scr: 0.91 (11/02/21) ?Age: 71 ?Weight: 109.3kg ? ?Appropriate dose and refill sent to requested pharmacy.  ?

## 2022-01-18 ENCOUNTER — Other Ambulatory Visit: Payer: Self-pay | Admitting: Physician Assistant

## 2022-01-20 ENCOUNTER — Telehealth (HOSPITAL_COMMUNITY): Payer: Self-pay

## 2022-01-20 ENCOUNTER — Encounter (HOSPITAL_COMMUNITY): Payer: Self-pay

## 2022-01-20 NOTE — Telephone Encounter (Signed)
Attempted to call patient in regards to Cardiac Rehab - LM on VM Mailed letter 

## 2022-02-07 ENCOUNTER — Ambulatory Visit (INDEPENDENT_AMBULATORY_CARE_PROVIDER_SITE_OTHER): Payer: PPO | Admitting: Family Medicine

## 2022-02-07 ENCOUNTER — Encounter: Payer: Self-pay | Admitting: Family Medicine

## 2022-02-07 VITALS — BP 132/78 | HR 58 | Ht 72.0 in | Wt 240.8 lb

## 2022-02-07 DIAGNOSIS — F32A Depression, unspecified: Secondary | ICD-10-CM

## 2022-02-07 DIAGNOSIS — E669 Obesity, unspecified: Secondary | ICD-10-CM | POA: Diagnosis not present

## 2022-02-07 DIAGNOSIS — E118 Type 2 diabetes mellitus with unspecified complications: Secondary | ICD-10-CM

## 2022-02-07 DIAGNOSIS — F419 Anxiety disorder, unspecified: Secondary | ICD-10-CM

## 2022-02-07 DIAGNOSIS — I480 Paroxysmal atrial fibrillation: Secondary | ICD-10-CM | POA: Diagnosis not present

## 2022-02-07 DIAGNOSIS — E785 Hyperlipidemia, unspecified: Secondary | ICD-10-CM | POA: Diagnosis not present

## 2022-02-07 DIAGNOSIS — E1169 Type 2 diabetes mellitus with other specified complication: Secondary | ICD-10-CM

## 2022-02-07 DIAGNOSIS — N529 Male erectile dysfunction, unspecified: Secondary | ICD-10-CM | POA: Diagnosis not present

## 2022-02-07 LAB — POCT GLYCOSYLATED HEMOGLOBIN (HGB A1C): Hemoglobin A1C: 7.4 % — AB (ref 4.0–5.6)

## 2022-02-07 NOTE — Progress Notes (Signed)
?Subjective:  ? ? Patient ID: FAUSTO SAMPEDRO, male    DOB: 03-17-1951, 71 y.o.   MRN: [id number] ? ?Jeremy Tucker is a 71 y.o. male who presents for follow-up of Type 2 diabetes mellitus. ? ?Home blood sugar records:  Not checking he has been through a lot of surgeries in the last several months and so follow-up at this time I think is more appropriate. ?Current symptoms/problems include none and have been unchanged. ?Daily foot checks:   Any foot concerns: no ?Exercise:  He will be starting cardiac rehab after a ?Diet: Regular ?He has had AVR surgery and should be going through cardiac rehab in the near future.  He has also had sinus surgery.  Continues have difficulty with erectile dysfunction which has some quite dismayed.  He has discussed this with several people who recommended injections and I agreed with that.  He is not using any sleep medications.  He continues on Eliquis for the PAF, amiodarone, Lopressor.  He continues on bupropion and seems to be doing well on that.  Continues on atorvastatin with no difficulty.  Allopurinol is causing no trouble.  Taking metformin twice per day. ?The following portions of the patient's history were reviewed and updated as appropriate: allergies, current medications, past medical history, past social history and problem list. ? ?ROS as in subjective above. ? ?   ?Objective:  ?  ?Physical Exam ?Alert and in no distress otherwise not examined. ? ?Hemoglobin A1c is 7.4 ?Lab Review ? ?  Latest Ref Rng & Units 12/15/2021  ?  1:38 PM 11/02/2021  ?  8:48 AM 10/01/2021  ? 12:16 PM 09/23/2021  ? 12:53 AM 09/22/2021  ?  3:56 AM  ?Diabetic Labs  ?HbA1c 4.8 - 5.6 % 7.1        ?Creatinine 0.61 - 1.24 mg/dL 0.91   0.83   0.95   0.67   0.71    ? ? ?  12/29/2021  ? 10:55 AM 12/24/2021  ?  9:15 AM 12/24/2021  ?  6:18 AM 12/24/2021  ?  6:09 AM 12/24/2021  ?  5:55 AM  ?BP/Weight  ?Systolic BP 740 814 [phone]  ?Diastolic BP 70 80 76 67 80  ?Wt. (Lbs) 241    240  ?BMI 32.69 kg/m2    32.55 kg/m2   ? ? ?  08/03/2021  ? 10:30 AM 02/19/2018  ? 10:45 AM  ?Foot/eye exam completion dates  ?Foot Form Completion Done Done  ? ? ?Ronon  reports that he has never smoked. He has never used smokeless tobacco. He reports current alcohol use. He reports that he does not use drugs. ? ?   ?Assessment & Plan:  ?  ? ?Hyperlipidemia associated with type 2 diabetes mellitus ([place]) - Plan: Lipid panel ? ?Controlled type 2 diabetes mellitus with complication, without long-term current use of insulin ([place]) - Plan: HgB A1c, CBC with Differential/Platelet, Comprehensive metabolic panel, Lipid panel ? ?Anxiety and depression ? ?Obesity (BMI 30-39.9) ? ?Erectile dysfunction, unspecified erectile dysfunction type ? ?Paroxysmal atrial fibrillation (HCC) ? ?Rx changes: none I discussed adding another medication to his regimen specifically GLP.  He would like to work for the ?Education: Reviewed ?ABCs? of diabetes management (respective goals in parentheses):  A1C (<7), blood pressure (<130/80), and cholesterol (LDL <100). ?Compliance at present is estimated to be fair. Efforts to improve compliance (if necessary) will be directed at increased exercise. ?Follow up: 4 months ?I discussed the erectile dysfunction that he is  having.  Encouraged him to consider using shots to see if this will help. ? ? ? ?  ?

## 2022-02-08 ENCOUNTER — Telehealth (HOSPITAL_COMMUNITY): Payer: Self-pay

## 2022-02-08 LAB — CBC WITH DIFFERENTIAL/PLATELET
Basophils Absolute: 0.1 10*3/uL (ref 0.0–0.2)
Basos: 1 %
EOS (ABSOLUTE): 0.2 10*3/uL (ref 0.0–0.4)
Eos: 3 %
Hematocrit: 42.3 % (ref 37.5–51.0)
Hemoglobin: 14.3 g/dL (ref 13.0–17.7)
Immature Grans (Abs): 0.1 10*3/uL (ref 0.0–0.1)
Immature Granulocytes: 1 %
Lymphocytes Absolute: 1.1 10*3/uL (ref 0.7–3.1)
Lymphs: 14 %
MCH: 31.1 pg (ref 26.6–33.0)
MCHC: 33.8 g/dL (ref 31.5–35.7)
MCV: 92 fL (ref 79–97)
Monocytes Absolute: 0.6 10*3/uL (ref 0.1–0.9)
Monocytes: 8 %
Neutrophils Absolute: 5.8 10*3/uL (ref 1.4–7.0)
Neutrophils: 73 %
Platelets: 178 10*3/uL (ref 150–450)
RBC: 4.6 x10E6/uL (ref 4.14–5.80)
RDW: 13.6 % (ref 11.6–15.4)
WBC: 7.9 10*3/uL (ref 3.4–10.8)

## 2022-02-08 LAB — COMPREHENSIVE METABOLIC PANEL
ALT: 54 IU/L — ABNORMAL HIGH (ref 0–44)
AST: 40 IU/L (ref 0–40)
Albumin/Globulin Ratio: 2.1 (ref 1.2–2.2)
Albumin: 4.7 g/dL (ref 3.7–4.7)
Alkaline Phosphatase: 101 IU/L (ref 44–121)
BUN/Creatinine Ratio: 14 (ref 10–24)
BUN: 12 mg/dL (ref 8–27)
Bilirubin Total: 0.3 mg/dL (ref 0.0–1.2)
CO2: 24 mmol/L (ref 20–29)
Calcium: 9.3 mg/dL (ref 8.6–10.2)
Chloride: 99 mmol/L (ref 96–106)
Creatinine, Ser: 0.88 mg/dL (ref 0.76–1.27)
Globulin, Total: 2.2 g/dL (ref 1.5–4.5)
Glucose: 144 mg/dL — ABNORMAL HIGH (ref 70–99)
Potassium: 4.4 mmol/L (ref 3.5–5.2)
Sodium: 138 mmol/L (ref 134–144)
Total Protein: 6.9 g/dL (ref 6.0–8.5)
eGFR: 92 mL/min/{1.73_m2} (ref >59)

## 2022-02-08 LAB — LIPID PANEL
Chol/HDL Ratio: 3.5 ratio (ref 0.0–5.0)
Cholesterol, Total: 166 mg/dL (ref 100–199)
HDL: 47 mg/dL (ref >39)
LDL Chol Calc (NIH): 89 mg/dL (ref 0–99)
Triglycerides: 178 mg/dL — ABNORMAL HIGH (ref 0–149)
VLDL Cholesterol Cal: 30 mg/dL (ref 5–40)

## 2022-02-08 MED ORDER — ROSUVASTATIN CALCIUM 40 MG PO TABS: 40.0000 mg | ORAL_TABLET | Freq: Every day | ORAL | 3 refills | Status: DC

## 2022-02-08 NOTE — Addendum Note (Signed)
Addended by: Denita Lung on: 02/08/2022 12:42 PM ? ? Modules accepted: Orders ? ?

## 2022-02-08 NOTE — Telephone Encounter (Signed)
No response from pt regarding CR.  Closed referral.  

## 2022-02-18 ENCOUNTER — Other Ambulatory Visit: Payer: Self-pay | Admitting: Interventional Cardiology

## 2022-02-18 ENCOUNTER — Other Ambulatory Visit: Payer: Self-pay | Admitting: Urology

## 2022-02-18 ENCOUNTER — Other Ambulatory Visit: Payer: Self-pay | Admitting: Physician Assistant

## 2022-02-28 DIAGNOSIS — C61 Malignant neoplasm of prostate: Secondary | ICD-10-CM | POA: Diagnosis not present

## 2022-02-28 LAB — PSA
PSA: 0.1
PSA: 0.1

## 2022-03-07 DIAGNOSIS — N401 Enlarged prostate with lower urinary tract symptoms: Secondary | ICD-10-CM | POA: Diagnosis not present

## 2022-03-07 DIAGNOSIS — R351 Nocturia: Secondary | ICD-10-CM | POA: Diagnosis not present

## 2022-03-07 DIAGNOSIS — Z8546 Personal history of malignant neoplasm of prostate: Secondary | ICD-10-CM | POA: Diagnosis not present

## 2022-03-07 DIAGNOSIS — N390 Urinary tract infection, site not specified: Secondary | ICD-10-CM | POA: Diagnosis not present

## 2022-03-07 DIAGNOSIS — R3121 Asymptomatic microscopic hematuria: Secondary | ICD-10-CM | POA: Diagnosis not present

## 2022-03-07 DIAGNOSIS — N5201 Erectile dysfunction due to arterial insufficiency: Secondary | ICD-10-CM | POA: Diagnosis not present

## 2022-03-07 DIAGNOSIS — N200 Calculus of kidney: Secondary | ICD-10-CM | POA: Diagnosis not present

## 2022-03-08 ENCOUNTER — Other Ambulatory Visit: Payer: Self-pay | Admitting: Physician Assistant

## 2022-03-14 ENCOUNTER — Telehealth: Payer: Self-pay | Admitting: Family Medicine

## 2022-03-14 DIAGNOSIS — E118 Type 2 diabetes mellitus with unspecified complications: Secondary | ICD-10-CM

## 2022-03-14 NOTE — Telephone Encounter (Signed)
Pt called and is requesting that you call him states he wll discuss it with you he would not tell me what is regards to ?

## 2022-03-15 MED ORDER — OZEMPIC (0.25 OR 0.5 MG/DOSE) 2 MG/1.5ML ~~LOC~~ SOPN: 0.5000 mg | PEN_INJECTOR | SUBCUTANEOUS | 0 refills | Status: DC

## 2022-03-15 NOTE — Telephone Encounter (Signed)
He called stating that he would like to start on Ozempic and I think that is very appropriate.  I will call in the dosing.  He is to return here in roughly a month after that so we can increase the dosing and go over any other side effects he might be having. ?

## 2022-03-22 ENCOUNTER — Other Ambulatory Visit: Payer: Self-pay | Admitting: Family Medicine

## 2022-03-22 DIAGNOSIS — F419 Anxiety disorder, unspecified: Secondary | ICD-10-CM

## 2022-03-22 NOTE — Telephone Encounter (Signed)
[facility] is requesting to fill pt  wellbutrin. Please advise. kh ?

## 2022-03-29 DIAGNOSIS — D225 Melanocytic nevi of trunk: Secondary | ICD-10-CM | POA: Diagnosis not present

## 2022-03-29 DIAGNOSIS — L814 Other melanin hyperpigmentation: Secondary | ICD-10-CM | POA: Diagnosis not present

## 2022-03-29 DIAGNOSIS — L821 Other seborrheic keratosis: Secondary | ICD-10-CM | POA: Diagnosis not present

## 2022-03-29 DIAGNOSIS — L570 Actinic keratosis: Secondary | ICD-10-CM | POA: Diagnosis not present

## 2022-04-04 NOTE — Progress Notes (Signed)
Cardiology Office Note:    Date:  04/05/2022   ID:  Jeremy Tucker, DOB February 16, 1951, MRN [id number]  PCP:  Jeremy Lung, Tucker  Cardiologist:  Jeremy Grooms, Tucker   Referring Tucker: Jeremy Lung, Tucker   Chief Complaint  Patient presents with   Follow-up    Aortic valve replacement Hyperlipidemia Hypertension Diabetes mellitus  Paroxysmal atrial fibrillation with prior ablation and persistent atrial fibs post aortic valve replacement    History of Present Illness:    Jeremy Tucker is a 71 y.o. male with a hx of  hypertension, hyperlipidemia, diabetes with peripheral neuropathy, paroxysmal atrial fibrillation status post ablation on Eliquis, OSA intolerant of CPAP, prostate cancer status post radioactive seed implant in April 2022, and aortic stenosis treated with SAVR Jeremy Tucker Resilia) and LAA occlusion 09/20/2021.   She is doing well.  He has some confusion about his medications.  States he is doing much better now than he was earlier in the year when he had difficulty with atrial fibrillation following aortic valve replacement.  He denies orthopnea.  Not walking as much as he did previously.  Denies orthopnea and PND.  No lower extremity swelling.  Sternum is well-healed.  Past Medical History:  Diagnosis Date   Anxiety    BPH (benign prostatic hyperplasia)    BPH without obstruction/lower urinary tract symptoms    Depression    Diabetic peripheral neuropathy ([place]) 01/07/2019   Dyspnea    w/exertion   ED (erectile dysfunction)    Essential hypertension    GERD (gastroesophageal reflux disease)    Hiatal hernia    History of chronic bronchitis    per pt used inhaler as needed   History of DVT of lower extremity 2001   s/p  left TKA completed blood thinner treatment,  per pt no previous clot and none since   History of gastric polyp    per pt benign   History of kidney stones    Hyperlipidemia    Mild CAD cardiologist--- Jeremy Jeremy Tucker   nuclear study 04-14-2017 normal ,  low risk, nuclear ef 53%;  cardiac cath 09-19-2018  midRCA 30% stenosis otherwise normal coronaries,  heavy AV calicification with decreased mobility with peak grandiant 18mHg   Mild dilation of ascending aorta ([place]    per echo 01-13-2021  322m  Nephrolithiasis    OA (osteoarthritis)    OSA (obstructive sleep apnea)    per pt refused cpap, intolerant;  moderate osa per study in epic 03-08-2015   PAF ([place]oxysmal atrial fibrillation) ([place]   followed by Jeremy Tucker 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 and ep ablation 04/ 2016   Peroneal neuropathy at knee, right 12/05/2018   [facility]  oncology-- Jeremy Tucker first dx 03/ 2021,  [place] 3+4  PSA 6.95   Pulmonary nodule 2019   incidently finding on CTA 09-25-2018, stable   Right carpal tunnel syndrome 01/07/2019   Severe aortic stenosis    Type 2 diabetes mellitus ([place]   followed by pcp   (03-02-2021  pt stated does not check blood sugar at home)   Umbilical hernia     Past Surgical History:  Procedure Laterality Date   AORTIC VALVE REPLACEMENT N/A 09/20/2021   Procedure: AORTIC VALVE REPLACEMENT (AVR) USING 25 MM INSPIRIS RESILIA  AORTIC VALVE;  Surgeon: Jeremy PollackMD;  [facility];  Service: Open Heart Surgery;  Laterality: N/A;  ATRIAL FIBRILLATION ABLATION N/A 03/12/2015   Procedure: ATRIAL FIBRILLATION ABLATION;  Surgeon: Jeremy Grayer, Tucker;  Location: [facility] CATH LAB;  Service: Cardiovascular;  Laterality: N/A;   CARDIAC CATHETERIZATION  1990's   "Jeremy Tucker", no blockages per pt   CARDIOVERSION N/A 02/25/2015   Procedure: CARDIOVERSION;  Surgeon: Jeremy Margarita, Tucker;  Location: [place];  Service: Cardiovascular;  Laterality: N/A;   CATARACT EXTRACTION W/ INTRAOCULAR LENS  IMPLANT, BILATERAL  2016   CLIPPING OF ATRIAL APPENDAGE Left 09/20/2021   Procedure: CLIPPING OF ATRIAL APPENDAGE USING ATRICURE 49 MM ATRICLIP;  Surgeon: Jeremy Pollack, Tucker;  Location: [place];  Service: Open Heart  Surgery;  Laterality: Left;   COLONOSCOPY  last one 2013  Jeremy Earlean Tucker   CYSTOSCOPY N/A 03/08/2021   Procedure: CYSTOSCOPY BLADDER CALCULI EXTRACTION ;  Surgeon: Jeremy Gallo, Tucker;  [facility];  Service: Urology;  Laterality: N/A;   CYSTOSCOPY WITH RETROGRADE PYELOGRAM, URETEROSCOPY AND STENT PLACEMENT Left 04/24/2015   Procedure: URETHRAL MEATAL DILATION, CYSTOSCOPY WITH LEFT RETROGRADE PYELOGRAM, LEFT URETEROSCOPY, STONE BASKET EXTRACTION,  LEFT DOUBLE J STENT PLACEMENT;  Surgeon: Jeremy Clines, Tucker;  Location: [facility];  Service: Urology;  Laterality: Left;   EP IMPLANTABLE DEVICE N/A 09/15/2015   Procedure: Loop Recorder Insertion;  Surgeon: Jeremy Grayer, Tucker;  Location: [place] CV LAB;  Service: Cardiovascular;  Laterality: N/A;   ESOPHAGOGASTRODUODENOSCOPY  last one 2017   EXTRACORPOREAL SHOCK WAVE LITHOTRIPSY Bilateral 04/02/2020   Procedure: EXTRACORPOREAL SHOCK WAVE LITHOTRIPSY (ESWL);  Surgeon: Jeremy Gustin, Tucker;  Location: [facility];  Service: Urology;  Laterality: Bilateral;   EXTRACORPOREAL SHOCK WAVE LITHOTRIPSY Left 04/06/2020   Procedure: EXTRACORPOREAL SHOCK WAVE LITHOTRIPSY (ESWL);  Surgeon: Jeremy Hughs, Tucker;  Location: [facility];  Service: Urology;  Laterality: Left;   EYE SURGERY     HOLMIUM LASER APPLICATION Left 40/34/7425   Procedure: HOLMIUM LASER APPLICATION;  Surgeon: Jeremy Clines, Tucker;  [facility];  Service: Urology;  Laterality: Left;   implantable loopr recorder removal  02/22/2021   MDT LINQ removed in office by Jeremy Tucker   JOINT REPLACEMENT     KNEE ARTHROSCOPY Bilateral "multiple times"   LEFT HEART CATH AND CORONARY ANGIOGRAPHY N/A 09/19/2018   Procedure: LEFT HEART CATH AND CORONARY ANGIOGRAPHY;  Surgeon: Jeremy Crome, Tucker;  Location: [place] CV LAB;  Service: Cardiovascular;  Laterality: N/A;   PERCUTANEOUS NEPHROSTOLITHOTOMY  1980s and 1990s   RADIOACTIVE SEED  IMPLANT N/A 03/08/2021   Procedure: RADIOACTIVE SEED IMPLANT/BRACHYTHERAPY IMPLANT;  Surgeon: Jeremy Gallo, Tucker;  Location[facility];  Service: Urology;  Laterality: N/A;  90 MINS   RIGHT/LEFT HEART CATH AND CORONARY ANGIOGRAPHY N/A 08/04/2021   Procedure: RIGHT/LEFT HEART CATH AND CORONARY ANGIOGRAPHY;  Surgeon: Jeremy Crome, Tucker;  Location: [place] CV LAB;  Service: Cardiovascular;  Laterality: N/A;   SINUS ENDO WITH FUSION Bilateral 12/24/2021   Procedure: BILATERAL ENDOSCOPIC SINUS SURGERY WITH INTRANASAL POLYPECTOMY AND FUSION NAVIGATION;  Surgeon: Jerrell Belfast, Tucker;  Location: [place];  Service: ENT;  Laterality: Bilateral;   SKIN BIOPSY Left 05/06/2020   Shave biopsy left posterior hand neurofiberoma.   SPACE OAR INSTILLATION N/A 03/08/2021   Procedure: SPACE OAR INSTILLATION;  Surgeon: Jeremy Gallo, Tucker;  Location: [facility];  Service: Urology;  Laterality: N/A;   TEE WITHOUT CARDIOVERSION N/A 03/12/2015   Procedure: TRANSESOPHAGEAL ECHOCARDIOGRAM (TEE);  Surgeon: Pixie Casino, Tucker;  Location: [place];  Service: Cardiovascular;  Laterality: N/A;   TEE WITHOUT CARDIOVERSION N/A 09/20/2021   Procedure: TRANSESOPHAGEAL ECHOCARDIOGRAM (TEE);  Surgeon: Jeremy Pollack, Tucker;  Location: [place];  Service: Open Heart Surgery;  Laterality: N/A;   TONSILLECTOMY  age 51   TOTAL KNEE ARTHROPLASTY Left 11-22-1999  '@[facility]'$    TOTAL KNEE ARTHROPLASTY Right 10/30/2017   Procedure: RIGHT TOTAL KNEE ARTHROPLASTY;  Surgeon: Paralee Cancel, Tucker;  [facility];  Service: Orthopedics;  Laterality: Right;  90 mins   UMBILICAL HERNIA REPAIR N/A 07/05/2021   Procedure: REPAIR INCARCERATED UMBILICAL HERNIA WITH MESH;  Surgeon: Georganna Skeans, Tucker;  Location: [place];  Service: General;  Laterality: N/A;    Current Medications: Current Meds  Medication Sig   acetaminophen (TYLENOL) 500 MG tablet Take 1,000 mg by mouth every 8 (eight) hours as needed for mild pain.     albuterol (VENTOLIN HFA) 108 (90 Base) MCG/ACT inhaler INHALE 2 PUFFS INTO THE LUNGS EVERY 6 HOURS AS NEEDED FOR WHEEZING OR SHORTNESS OF BREATH   allopurinol (ZYLOPRIM) 300 MG tablet Take 1 tablet (300 mg total) by mouth daily.   amiodarone (PACERONE) 100 MG tablet Take 1 tablet (100 mg total) by mouth daily.   ammonium lactate (LAC-HYDRIN) 12 % lotion Apply 1 application topically daily as needed for dry skin.   apixaban (ELIQUIS) 5 MG TABS tablet Take 1 tablet (5 mg total) by mouth 2 (two) times daily.   aspirin EC 81 MG tablet Take 1 tablet (81 mg total) by mouth daily. Swallow whole.   buPROPion (WELLBUTRIN XL) 300 MG 24 hr tablet TAKE ONE TABLET BY MOUTH DAILY   buPROPion HCl (WELLBUTRIN PO) Take 300 mg by mouth daily. Pt takes 1/2 tablet once a day.   fluticasone (FLONASE) 50 MCG/ACT nasal spray Place 2 sprays into both nostrils daily.   fluticasone-salmeterol (ADVAIR) 250-50 MCG/ACT AEPB INHALE 1 PUFF INTO THE LUNGS TWO TIMES A DAY (Patient taking differently: Inhale 1 puff into the lungs 2 (two) times daily as needed (asthma).)   HYDROcodone-acetaminophen (NORCO/VICODIN) 5-325 MG tablet Take 1 tablet by mouth every 4 (four) hours as needed for moderate pain.   loratadine (CLARITIN) 10 MG tablet Take 10 mg by mouth in the morning.   LORazepam (ATIVAN) 0.5 MG tablet TAKE 1 TABLET TWICE DAILY AS NEEDED FOR ANXIETY.   metFORMIN (GLUCOPHAGE) 500 MG tablet Take 1 tablet (500 mg total) by mouth 2 (two) times daily with a meal.   metoprolol succinate (TOPROL XL) 25 MG 24 hr tablet Take 1 tablet (25 mg total) by mouth daily.   Multiple Vitamins-Minerals (MULTIVITAMIN WITH MINERALS) tablet Take 1 tablet by mouth daily.   olopatadine (PATANOL) 0.1 % ophthalmic solution Place 1 drop into both eyes daily as needed for allergies (tearing).    Omega-3 Fatty Acids (FISH OIL) 1000 MG CAPS Take 1,000 mg by mouth daily.   omeprazole (PRILOSEC) 20 MG capsule Take 20 mg by mouth daily.   rosuvastatin  (CRESTOR) 40 MG tablet Take 1 tablet (40 mg total) by mouth daily.   Semaglutide,0.25 or 0.'5MG'$ /DOS, (OZEMPIC, 0.25 OR 0.5 MG/DOSE,) 2 MG/1.5ML SOPN Inject 0.5 mg into the skin once a week.   sertraline (ZOLOFT) 50 MG tablet Take 1 tablet (50 mg total) by mouth daily.   sildenafil (VIAGRA) 100 MG tablet Take 1 tablet (100 mg total) by mouth daily as needed for erectile dysfunction.   tamsulosin (FLOMAX) 0.4 MG CAPS capsule Take 0.4 mg by mouth at bedtime. 2 pills at bedtime   traZODone (DESYREL) 50  MG tablet TAKE 1/2 TO 1 TABLETS BY MOUTH AS NEEDED FOR SLEEP   zolpidem (AMBIEN) 5 MG tablet Take 1 tablet (5 mg total) by mouth at bedtime as needed for sleep.   [DISCONTINUED] amiodarone (PACERONE) 200 MG tablet Take 200 mg by mouth daily. Pt take 1/2 tablet 100 mg in the AM, and 1/2 tablet 100 mg in the PM   [DISCONTINUED] metoprolol tartrate (LOPRESSOR) 25 MG tablet Take 1 tablet (25 mg total) by mouth 2 (two) times daily.     Allergies:   Oxycodone   Social History   Socioeconomic History   Marital status: Married    Spouse name: Not on file   Number of children: 1   Years of education: Not on file   Highest education level: Some college, no degree  Occupational History   Occupation: Retired  Tobacco Use   Smoking status: Never   Smokeless tobacco: Never  Vaping Use   Vaping Use: Never used  Substance and Sexual Activity   Alcohol use: Yes    Comment: very rare   Drug use: Never   Sexual activity: Yes  Other Topics Concern   Not on file  Social History Narrative   Pt lives in [place] with spouse.  64 yo son is health.  Second son died in car accident.      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 There is no history of Breast cancer, Colon cancer, Pancreatic cancer, or Prostate cancer.  ROS:   Please see the history of present illness.    Having erectile dysfunction following radiation therapy of his prostate cancer.  Now on sildenafil.  All other systems reviewed and are negative.  EKGs/Labs/Other Studies Reviewed:    The following studies were reviewed today: No new cardiac imaging  EKG:  EKG not performed.  Recent Labs: 07/27/2021: TSH 2.280 09/21/2021: Magnesium 2.1 10/01/2021: NT-Pro BNP 1,168 02/07/2022: ALT 54; BUN 12; Creatinine, Ser 0.88; Hemoglobin 14.3; Platelets 178; Potassium 4.4; Sodium 138  Recent Lipid Panel    Component Value Date/Time   CHOL 166 02/07/2022 1555   TRIG 178 (H) 02/07/2022 1555   HDL 47 02/07/2022 1555   CHOLHDL 3.5 02/07/2022 1555   CHOLHDL 3.6 08/16/2016 0925   VLDL 28 08/16/2016 0925   LDLCALC 89 02/07/2022 1555    Physical Exam:    VS:  BP 132/86   Pulse 79   Ht 6' (1.829 m)   Wt 239 lb (108.4 kg)   SpO2 95%   BMI 32.41 kg/m     Wt Readings from Last 3 Encounters:  04/05/22 239 lb (108.4 kg)  02/07/22 240 lb 12.8 oz (109.2 kg)  12/29/21 241 lb (109.3 kg)     GEN: Obese. No acute distress HEENT: Normal NECK: No JVD. LYMPHATICS: No lymphadenopathy CARDIAC: No diastolic or systolic murmur.  Crisp aortic valve closure sound.  RRR no gallop, or edema. VASCULAR:  Normal Pulses. No bruits. RESPIRATORY:  Clear to auscultation without  rales, wheezing or rhonchi  ABDOMEN: Soft, non-tender, non-distended, No pulsatile mass, MUSCULOSKELETAL: No deformity  SKIN: Warm and dry NEUROLOGIC:  Alert and oriented x 3 PSYCHIATRIC:  Normal affect   ASSESSMENT:    1. S/P AVR   2. Paroxysmal atrial fibrillation (HCC)   3. Aortic  atherosclerosis ([place])   4. Essential hypertension   5. OSA (obstructive sleep apnea)   6. Chronic anticoagulation    PLAN:    In order of problems listed above:  Status post aortic valve replacement with bioprosthetic Edwards Resilia. Status post ablation of left atrial appendage during surgery.  Postop A-fib resolved spontaneously on oral amiodarone.  Now greater than 3 months out from surgery.  We will decrease amiodarone to 100 mg/day for 1 month and then discontinue.  He will follow-up here in 4 months with an EKG to determine rhythm. Needs continued aggressive lipid-lowering because of aortic atherosclerosis.  Continue aspirin 81 mg/day.  May at some point discontinue the aspirin if we continued long-term Eliquis therapy. Add metoprolol succinate 25 mg/day. Encouraged compliance with CPAP. Continue Eliquis 5 mg twice daily.  May switch to Eliquis only.  We will see how he is doing and 4 months off amiodarone.    Medication Adjustments/Labs and Tests Ordered: Current medicines are reviewed at length with the patient today.  Concerns regarding medicines are outlined above.  No orders of the defined types were placed in this encounter.  Meds ordered this encounter  Medications   metoprolol succinate (TOPROL XL) 25 MG 24 hr tablet    Sig: Take 1 tablet (25 mg total) by mouth daily.    Dispense:  90 tablet    Refill:  3   amiodarone (PACERONE) 100 MG tablet    Sig: Take 1 tablet (100 mg total) by mouth daily.    Dose change    Patient Instructions  Medication Instructions:  Your physician has recommended you make the following change in your medication:   1) STOP Metoprolol tartrate 2) START Metoprolol succinate (Toprol XL) '25mg'$  daily 3) DECREASE Amiodarone to '100mg'$  once daily for 1 month then discontinue.  *If you need a refill on your cardiac medications before your next appointment, please call your pharmacy*  Lab  Work: NONE  Testing/Procedures: NONE  Follow-Up: At [facility], you and your health needs are our priority.  As part of our continuing mission to provide you with exceptional heart care, we have created designated Provider Care Teams.  These Care Teams include your primary Cardiologist (physician) and Advanced Practice Providers (APPs -  Physician Assistants and Nurse Practitioners) who all work together to provide you with the care you need, when you need it.  Your next appointment:   4 month(s)  The format for your next appointment:   In Person  Provider:   [name], Tucker {   Important Information About Sugar         Signed, Jeremy Grooms, Tucker  04/05/2022 11:09 AM    [place]

## 2022-04-05 ENCOUNTER — Encounter: Payer: Self-pay | Admitting: Interventional Cardiology

## 2022-04-05 ENCOUNTER — Ambulatory Visit: Payer: PPO | Admitting: Interventional Cardiology

## 2022-04-05 VITALS — BP 132/86 | HR 79 | Ht 72.0 in | Wt 239.0 lb

## 2022-04-05 DIAGNOSIS — I700 Atherosclerosis of aorta: Secondary | ICD-10-CM | POA: Diagnosis not present

## 2022-04-05 DIAGNOSIS — I10 Essential (primary) hypertension: Secondary | ICD-10-CM

## 2022-04-05 DIAGNOSIS — Z952 Presence of prosthetic heart valve: Secondary | ICD-10-CM | POA: Diagnosis not present

## 2022-04-05 DIAGNOSIS — I480 Paroxysmal atrial fibrillation: Secondary | ICD-10-CM

## 2022-04-05 DIAGNOSIS — Z7901 Long term (current) use of anticoagulants: Secondary | ICD-10-CM

## 2022-04-05 DIAGNOSIS — G4733 Obstructive sleep apnea (adult) (pediatric): Secondary | ICD-10-CM

## 2022-04-05 MED ORDER — AMIODARONE HCL 100 MG PO TABS: 100.0000 mg | ORAL_TABLET | Freq: Every day | ORAL | Status: DC

## 2022-04-05 MED ORDER — METOPROLOL SUCCINATE ER 25 MG PO TB24: 25.0000 mg | ORAL_TABLET | Freq: Every day | ORAL | 3 refills | Status: DC

## 2022-04-05 NOTE — Patient Instructions (Signed)
Medication Instructions:  Your physician has recommended you make the following change in your medication:   1) STOP Metoprolol tartrate 2) START Metoprolol succinate (Toprol XL) '25mg'$  daily 3) DECREASE Amiodarone to '100mg'$  once daily for 1 month then discontinue.  *If you need a refill on your cardiac medications before your next appointment, please call your pharmacy*  Lab Work: NONE  Testing/Procedures: NONE  Follow-Up: At [facility], you and your health needs are our priority.  As part of our continuing mission to provide you with exceptional heart care, we have created designated Provider Care Teams.  These Care Teams include your primary Cardiologist (physician) and Advanced Practice Providers (APPs -  Physician Assistants and Nurse Practitioners) who all work together to provide you with the care you need, when you need it.  Your next appointment:   4 month(s)  The format for your next appointment:   In Person  Provider:   [name], MD {   Important Information About Sugar

## 2022-04-09 ENCOUNTER — Other Ambulatory Visit: Payer: Self-pay | Admitting: Family Medicine

## 2022-04-09 DIAGNOSIS — E118 Type 2 diabetes mellitus with unspecified complications: Secondary | ICD-10-CM

## 2022-05-06 DIAGNOSIS — R351 Nocturia: Secondary | ICD-10-CM | POA: Diagnosis not present

## 2022-05-06 DIAGNOSIS — Z8546 Personal history of malignant neoplasm of prostate: Secondary | ICD-10-CM | POA: Diagnosis not present

## 2022-05-06 DIAGNOSIS — R6882 Decreased libido: Secondary | ICD-10-CM | POA: Diagnosis not present

## 2022-05-06 DIAGNOSIS — R3121 Asymptomatic microscopic hematuria: Secondary | ICD-10-CM | POA: Diagnosis not present

## 2022-05-06 DIAGNOSIS — N401 Enlarged prostate with lower urinary tract symptoms: Secondary | ICD-10-CM | POA: Diagnosis not present

## 2022-05-18 ENCOUNTER — Other Ambulatory Visit: Payer: Self-pay | Admitting: Family Medicine

## 2022-05-18 DIAGNOSIS — F32A Depression, unspecified: Secondary | ICD-10-CM

## 2022-05-28 DIAGNOSIS — I10 Essential (primary) hypertension: Secondary | ICD-10-CM | POA: Diagnosis not present

## 2022-05-28 DIAGNOSIS — S52501A Unspecified fracture of the lower end of right radius, initial encounter for closed fracture: Secondary | ICD-10-CM | POA: Diagnosis not present

## 2022-05-28 DIAGNOSIS — Z7901 Long term (current) use of anticoagulants: Secondary | ICD-10-CM | POA: Diagnosis not present

## 2022-05-28 DIAGNOSIS — I4891 Unspecified atrial fibrillation: Secondary | ICD-10-CM | POA: Diagnosis not present

## 2022-05-28 DIAGNOSIS — E119 Type 2 diabetes mellitus without complications: Secondary | ICD-10-CM | POA: Diagnosis not present

## 2022-05-28 DIAGNOSIS — W010XXA Fall on same level from slipping, tripping and stumbling without subsequent striking against object, initial encounter: Secondary | ICD-10-CM | POA: Diagnosis not present

## 2022-05-28 DIAGNOSIS — Y9301 Activity, walking, marching and hiking: Secondary | ICD-10-CM | POA: Diagnosis not present

## 2022-05-28 DIAGNOSIS — W1839XA Other fall on same level, initial encounter: Secondary | ICD-10-CM | POA: Diagnosis not present

## 2022-05-28 DIAGNOSIS — Y929 Unspecified place or not applicable: Secondary | ICD-10-CM | POA: Diagnosis not present

## 2022-05-28 DIAGNOSIS — E785 Hyperlipidemia, unspecified: Secondary | ICD-10-CM | POA: Diagnosis not present

## 2022-05-28 DIAGNOSIS — Y9289 Other specified places as the place of occurrence of the external cause: Secondary | ICD-10-CM | POA: Diagnosis not present

## 2022-05-28 DIAGNOSIS — M25531 Pain in right wrist: Secondary | ICD-10-CM | POA: Diagnosis not present

## 2022-05-28 DIAGNOSIS — S52591A Other fractures of lower end of right radius, initial encounter for closed fracture: Secondary | ICD-10-CM | POA: Diagnosis not present

## 2022-05-28 DIAGNOSIS — Y999 Unspecified external cause status: Secondary | ICD-10-CM | POA: Diagnosis not present

## 2022-05-30 DIAGNOSIS — M25531 Pain in right wrist: Secondary | ICD-10-CM | POA: Diagnosis not present

## 2022-05-30 DIAGNOSIS — S52501A Unspecified fracture of the lower end of right radius, initial encounter for closed fracture: Secondary | ICD-10-CM | POA: Diagnosis not present

## 2022-06-06 DIAGNOSIS — S52501A Unspecified fracture of the lower end of right radius, initial encounter for closed fracture: Secondary | ICD-10-CM | POA: Diagnosis not present

## 2022-06-13 DIAGNOSIS — S52501A Unspecified fracture of the lower end of right radius, initial encounter for closed fracture: Secondary | ICD-10-CM | POA: Diagnosis not present

## 2022-07-04 DIAGNOSIS — S52501A Unspecified fracture of the lower end of right radius, initial encounter for closed fracture: Secondary | ICD-10-CM | POA: Diagnosis not present

## 2022-07-20 ENCOUNTER — Encounter: Payer: Self-pay | Admitting: Internal Medicine

## 2022-08-02 ENCOUNTER — Telehealth: Payer: Self-pay | Admitting: Family Medicine

## 2022-08-02 NOTE — Telephone Encounter (Signed)
Left message for patient to call back and schedule Medicare Annual Wellness Visit (AWV) either virtually or in office. I left my number for patient to call [phone].  Last AWV ;08/03/21  please schedule at anytime with health coach    .

## 2022-08-05 ENCOUNTER — Other Ambulatory Visit: Payer: Self-pay | Admitting: Family Medicine

## 2022-08-05 ENCOUNTER — Telehealth: Payer: Self-pay | Admitting: Family Medicine

## 2022-08-05 DIAGNOSIS — F419 Anxiety disorder, unspecified: Secondary | ICD-10-CM

## 2022-08-05 DIAGNOSIS — Z87442 Personal history of urinary calculi: Secondary | ICD-10-CM

## 2022-08-05 MED ORDER — SERTRALINE HCL 50 MG PO TABS: 50.0000 mg | ORAL_TABLET | Freq: Every day | ORAL | 0 refills | Status: DC

## 2022-08-05 NOTE — Telephone Encounter (Signed)
Fax request from [facility] for Sertraline '50mg'$  #90

## 2022-08-09 ENCOUNTER — Telehealth: Payer: Self-pay | Admitting: Licensed Clinical Social Worker

## 2022-08-09 NOTE — Patient Instructions (Signed)
Visit Information  Thank you for taking time to visit with me today. Please don't hesitate to contact me if I can be of assistance to you.   Following are the goals we discussed today:   Goals Addressed             This Visit's Progress    COMPLETED: Care Coordination Activities-LCSW No Follow Up       Care Coordination Interventions: Active listening / Reflection utilized  Emotional Support Provided LCSW informed patient of care coordination services. Pt is not interested at this time and agreed to contact PCP, should needs arise LCSW informed pt of vaccine availability. He agreed to update PCP, should he obtain vaccinne from preferred pharmacy LCSW reviewed upcoming appts         If you are experiencing a Mental Health or [place] or need someone to talk to, please call the Suicide and Crisis Lifeline: 988 call 911   Patient verbalizes understanding of instructions and care plan provided today and agrees to view in [place]. Active MyChart status and patient understanding of how to access instructions and care plan via MyChart confirmed with patient.     No further follow up required:    Christa See, [email or web address], [email or web address] Phone [phone] 3:00 PM

## 2022-08-09 NOTE — Patient Outreach (Signed)
  Care Coordination   Initial Visit Note   08/09/2022 Name: Jeremy Tucker MRN: [id number] DOB: [birth year]  Jeremy Tucker is a 71 y.o. year old male who sees Jeremy Tucker, Jeremy Jarvis, MD for primary care. I spoke with  Jeremy Tucker by phone today.  What matters to the patients health and wellness today?  Care Coordination    Goals Addressed             This Visit's Progress    COMPLETED: Care Coordination Activities-LCSW No Follow Up       Care Coordination Interventions: Active listening / Reflection utilized  Emotional Support Provided LCSW informed patient of care coordination services. Pt is not interested at this time and agreed to contact PCP, should needs arise LCSW informed pt of vaccine availability. He agreed to update PCP, should he obtain vaccinne from preferred pharmacy LCSW reviewed upcoming appts         SDOH assessments and interventions completed:  No     Care Coordination Interventions Activated:  Yes  Care Coordination Interventions:  Yes, provided   Follow up plan: No further intervention required.   Encounter Outcome:  Pt. Refused   Jeremy Tucker, MSW, [email or web address] Phone [phone] 3:00 PM

## 2022-08-14 NOTE — Progress Notes (Unsigned)
Cardiology Office Note:    Date:  08/15/2022   ID:  Jeremy Tucker, DOB Mar 14, 1951, MRN [id number]  PCP:  Denita Lung, MD  Cardiologist:  [name], MD   Referring MD: Denita Lung, MD   Chief Complaint  Patient presents with   Coronary Artery Disease   Cardiac Valve Problem   Hyperlipidemia    History of Present Illness:    Jeremy Tucker is a 71 y.o. male with a hx of hypertension, hyperlipidemia, diabetes with peripheral neuropathy, paroxysmal atrial fibrillation status post ablation on Eliquis, OSA intolerant of CPAP, prostate cancer status post radioactive seed implant in April 2022, and aortic stenosis treated with SAVR [name] and LAA occlusion 09/20/2021.   Not maintaining an active lifestyle.  Says he is not motivated to exercise.  No cardiac complaints.  In particular, no palpitations, dyspnea, orthopnea, edema, or chest pain.  He has not noted lower extremity swelling.  He sleeps okay.  Past Medical History:  Diagnosis Date   Anxiety    BPH (benign prostatic hyperplasia)    BPH without obstruction/lower urinary tract symptoms    Depression    Diabetic peripheral neuropathy ([place]) 01/07/2019   Dyspnea    w/exertion   ED (erectile dysfunction)    Essential hypertension    GERD (gastroesophageal reflux disease)    Hiatal hernia    History of chronic bronchitis    per pt used inhaler as needed   History of DVT of lower extremity 2001   s/p  left TKA completed blood thinner treatment,  per pt no previous clot and none since   History of gastric polyp    per pt benign   History of kidney stones    Hyperlipidemia    Mild CAD cardiologist--- dr h. Tamala Julian   nuclear study 04-14-2017 normal , low risk, nuclear ef 53%;  cardiac cath 09-19-2018  midRCA 30% stenosis otherwise normal coronaries,  heavy AV calicification with decreased mobility with peak grandiant 28mHg   Mild dilation of ascending aorta ([place]    per echo 01-13-2021  366m   Nephrolithiasis    OA (osteoarthritis)    OSA (obstructive sleep apnea)    per pt refused cpap, intolerant;  moderate osa per study in epic 03-08-2015   PAF (paroxysmal atrial fibrillation) ([facility]   followed by dr h. 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   [facility]/  oncology-- dr maTammi Klippel first dx 03/ 2021,  [place] 3+4  PSA 6.95   Pulmonary nodule 2019   incidently finding on CTA 09-25-2018, stable   Right carpal tunnel syndrome 01/07/2019   Severe aortic stenosis    Type 2 diabetes mellitus ([facility]   followed by pcp   (03-02-2021  pt stated does not check blood sugar at home)   Umbilical hernia     Past Surgical History:  Procedure Laterality Date   AORTIC VALVE REPLACEMENT N/A 09/20/2021   Procedure: AORTIC VALVE REPLACEMENT (AVR) USING 25 MM INSPIRIS RESILIA  AORTIC VALVE;  Surgeon: BaGaye PollackMD;  [facility];  Service: Open Heart Surgery;  Laterality: N/A;   ATRIAL FIBRILLATION ABLATION N/A 03/12/2015   Procedure: ATRIAL FIBRILLATION ABLATION;  Surgeon: JaThompson GrayerMD;  [facility];  Service: Cardiovascular;  Laterality: N/A;   CARDIAC CATHETERIZATION  1990's   "Dr. GaMelvern Banker no blockages per  pt   CARDIOVERSION N/A 02/25/2015   Procedure: CARDIOVERSION;  Surgeon: Sueanne Margarita, MD;  Location: [place];  Service: Cardiovascular;  Laterality: N/A;   CATARACT EXTRACTION W/ INTRAOCULAR LENS  IMPLANT, BILATERAL  2016   CLIPPING OF ATRIAL APPENDAGE Left 09/20/2021   Procedure: CLIPPING OF ATRIAL APPENDAGE USING ATRICURE 23 MM ATRICLIP;  Surgeon: [name], MD;  Location: [place];  Service: Open Heart Surgery;  Laterality: Left;   COLONOSCOPY  last one 2013  dr Earlean Shawl   CYSTOSCOPY N/A 03/08/2021   Procedure: CYSTOSCOPY BLADDER CALCULI EXTRACTION ;  Surgeon: Franchot Gallo, MD;  Location: [facility];  Service: Urology;   Laterality: N/A;   CYSTOSCOPY WITH RETROGRADE PYELOGRAM, URETEROSCOPY AND STENT PLACEMENT Left 04/24/2015   Procedure: URETHRAL MEATAL DILATION, CYSTOSCOPY WITH LEFT RETROGRADE PYELOGRAM, LEFT URETEROSCOPY, STONE BASKET EXTRACTION,  LEFT DOUBLE J STENT PLACEMENT;  Surgeon: Carolan Clines, MD;  [facility];  Service: Urology;  Laterality: Left;   EP IMPLANTABLE DEVICE N/A 09/15/2015   Procedure: Loop Recorder Insertion;  Surgeon: [name], MD;  Location: [place] [facility] LAB;  Service: Cardiovascular;  Laterality: N/A;   ESOPHAGOGASTRODUODENOSCOPY  last one 2017   EXTRACORPOREAL SHOCK WAVE LITHOTRIPSY Bilateral 04/02/2020   Procedure: EXTRACORPOREAL SHOCK WAVE LITHOTRIPSY (ESWL);  Surgeon: Cleon Gustin, MD;  Location: [facility];  Service: Urology;  Laterality: Bilateral;   EXTRACORPOREAL SHOCK WAVE LITHOTRIPSY Left 04/06/2020   Procedure: EXTRACORPOREAL SHOCK WAVE LITHOTRIPSY (ESWL);  Surgeon: [name], MD;  Location: [facility];  Service: Urology;  Laterality: Left;   EYE SURGERY     HOLMIUM LASER APPLICATION Left 32/95/1884   Procedure: HOLMIUM LASER APPLICATION;  Surgeon: Carolan Clines, MD;  [facility];  Service: Urology;  Laterality: Left;   implantable loopr recorder removal  02/22/2021   MDT LINQ removed in office by Dr Allred   JOINT REPLACEMENT     KNEE ARTHROSCOPY Bilateral "multiple times"   LEFT HEART CATH AND CORONARY ANGIOGRAPHY N/A 09/19/2018   Procedure: LEFT HEART CATH AND CORONARY ANGIOGRAPHY;  Surgeon: Belva Crome, MD;  Location: [place] CV LAB;  Service: Cardiovascular;  Laterality: N/A;   PERCUTANEOUS NEPHROSTOLITHOTOMY  1980s and 1990s   RADIOACTIVE SEED IMPLANT N/A 03/08/2021   Procedure: RADIOACTIVE SEED IMPLANT/BRACHYTHERAPY IMPLANT;  Surgeon: Franchot Gallo, MD;  Location: [facility];  Service: Urology;  Laterality: N/A;  90 MINS   RIGHT/LEFT HEART CATH AND CORONARY  ANGIOGRAPHY N/A 08/04/2021   Procedure: RIGHT/LEFT HEART CATH AND CORONARY ANGIOGRAPHY;  Surgeon: Belva Crome, MD;  Location: [place] [facility] LAB;  Service: Cardiovascular;  Laterality: N/A;   SINUS ENDO WITH FUSION Bilateral 12/24/2021   Procedure: BILATERAL ENDOSCOPIC SINUS SURGERY WITH INTRANASAL POLYPECTOMY AND FUSION NAVIGATION;  Surgeon: Jerrell Belfast, MD;  Location: [place];  Service: ENT;  Laterality: Bilateral;   SKIN BIOPSY Left 05/06/2020   Shave biopsy left posterior hand neurofiberoma.   SPACE OAR INSTILLATION N/A 03/08/2021   Procedure: SPACE OAR INSTILLATION;  Surgeon: Franchot Gallo, MD;  Location[facility];  Service: Urology;  Laterality: N/A;   TEE WITHOUT CARDIOVERSION N/A 03/12/2015   Procedure: TRANSESOPHAGEAL ECHOCARDIOGRAM (TEE);  Surgeon: Pixie Casino, MD;  Location: [place];  Service: Cardiovascular;  Laterality: N/A;   TEE WITHOUT CARDIOVERSION N/A 09/20/2021   Procedure: TRANSESOPHAGEAL ECHOCARDIOGRAM (TEE);  Surgeon: [name], MD;  Location: [place];  Service: Open Heart Surgery;  Laterality: N/A;   TONSILLECTOMY  age 82  TOTAL KNEE ARTHROPLASTY Left 11-22-1999  '@[facility]'$    TOTAL KNEE ARTHROPLASTY Right 10/30/2017   Procedure: RIGHT TOTAL KNEE ARTHROPLASTY;  Surgeon: Paralee Cancel, MD;  [facility];  Service: Orthopedics;  Laterality: Right;  90 mins   UMBILICAL HERNIA REPAIR N/A 07/05/2021   Procedure: REPAIR INCARCERATED UMBILICAL HERNIA WITH MESH;  Surgeon: Georganna Skeans, MD;  Location: [place];  Service: General;  Laterality: N/A;    Current Medications: Current Meds  Medication Sig   acetaminophen (TYLENOL) 500 MG tablet Take 1,000 mg by mouth every 8 (eight) hours as needed for mild pain.    albuterol (VENTOLIN HFA) 108 (90 Base) MCG/ACT inhaler INHALE 2 PUFFS INTO THE LUNGS EVERY 6 HOURS AS NEEDED FOR WHEEZING OR SHORTNESS OF BREATH   allopurinol (ZYLOPRIM) 300 MG tablet TAKE ONE TABLET BY MOUTH DAILY   ammonium lactate  (LAC-HYDRIN) 12 % lotion Apply 1 application topically daily as needed for dry skin.   apixaban (ELIQUIS) 5 MG TABS tablet Take 1 tablet (5 mg total) by mouth 2 (two) times daily.   aspirin EC 81 MG tablet Take 1 tablet (81 mg total) by mouth daily. Swallow whole.   buPROPion (WELLBUTRIN XL) 300 MG 24 hr tablet TAKE ONE TABLET BY MOUTH DAILY   buPROPion HCl (WELLBUTRIN PO) Take 300 mg by mouth daily. Pt takes 1/2 tablet once a day.   fluticasone (FLONASE) 50 MCG/ACT nasal spray Place 2 sprays into both nostrils daily.   fluticasone-salmeterol (ADVAIR) 250-50 MCG/ACT AEPB INHALE 1 PUFF INTO THE LUNGS TWO TIMES A DAY (Patient taking differently: Inhale 1 puff into the lungs 2 (two) times daily as needed (asthma).)   loratadine (CLARITIN) 10 MG tablet Take 10 mg by mouth in the morning.   LORazepam (ATIVAN) 0.5 MG tablet TAKE 1 TABLET TWICE DAILY AS NEEDED FOR ANXIETY.   metoprolol succinate (TOPROL XL) 25 MG 24 hr tablet Take 1 tablet (25 mg total) by mouth daily.   Multiple Vitamins-Minerals (MULTIVITAMIN WITH MINERALS) tablet Take 1 tablet by mouth daily.   olopatadine (PATANOL) 0.1 % ophthalmic solution Place 1 drop into both eyes daily as needed for allergies (tearing).    Omega-3 Fatty Acids (FISH OIL) 1000 MG CAPS Take 1,000 mg by mouth daily.   omeprazole (PRILOSEC) 20 MG capsule Take 20 mg by mouth daily.   OZEMPIC, 0.25 OR 0.5 MG/DOSE, 2 MG/3ML SOPN DIAL AND INJECT UNDER THE SKIN 0.5 MG WEEKLY   rosuvastatin (CRESTOR) 40 MG tablet Take 1 tablet (40 mg total) by mouth daily.   sertraline (ZOLOFT) 50 MG tablet Take 1 tablet (50 mg total) by mouth daily.   sildenafil (VIAGRA) 100 MG tablet Take 1 tablet (100 mg total) by mouth daily as needed for erectile dysfunction.   tamsulosin (FLOMAX) 0.4 MG CAPS capsule Take 0.4 mg by mouth daily. Pt takes 1 tablet daily.   traZODone (DESYREL) 50 MG tablet TAKE 1/2 TO 1 TABLETS BY MOUTH AS NEEDED FOR SLEEP   zolpidem (AMBIEN) 5 MG tablet Take 1 tablet  (5 mg total) by mouth at bedtime as needed for sleep.     Allergies:   Oxycodone   Social History   Socioeconomic History   Marital status: Married    Spouse name: Not on file   Number of children: 1   Years of education: Not on file   Highest education level: Some college, no degree  Occupational History   Occupation: Retired  Tobacco Use   Smoking status: Never   Smokeless tobacco: Never  Vaping Use   Vaping Use: Never used  Substance and Sexual Activity   Alcohol use: Yes    Comment: very rare   Drug use: Never   Sexual activity: Yes  Other Topics Concern   Not on file  Social History Narrative   Pt lives in [place] with spouse.  76 yo son is health.  Second son died in car accident.      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 There is no history of Breast cancer, Colon cancer, Pancreatic cancer, or Prostate cancer.  ROS:   Please see the history of present illness.    Fractured his right radius bone during a fall while visiting in [place].  Decreased enthusiasm for physical activity.  All other systems reviewed and are negative.  EKGs/Labs/Other Studies Reviewed:    The following studies were reviewed today:  2D Doppler echocardiogram 11/02/2021: IMPRESSIONS   1. Left ventricular ejection fraction, by estimation, is 60 to 65%. The  left ventricle has normal function. The left ventricle has no regional  wall motion abnormalities. There is mild asymmetric left ventricular  hypertrophy  of the basal and septal  segments. Left ventricular diastolic parameters are indeterminate.   2. Right ventricular systolic function is normal. The right ventricular  size is normal. There is normal pulmonary artery systolic pressure. The  estimated right ventricular systolic pressure is 32.2 mmHg.   3. The mitral valve is normal in structure. Mild mitral valve  regurgitation. No evidence of mitral stenosis.   4. The aortic valve has been repaired/replaced. Aortic valve  regurgitation is not visualized. No aortic stenosis is present. There is a  25 mm Inspiris Resilia valve present in the aortic position. Echo findings  are consistent with normal structure and  function of the aortic valve prosthesis. Aortic valve area, by VTI  measures 2.10 cm. Aortic valve mean gradient measures 7.6 mmHg. Aortic  valve Vmax measures 1.99 m/s.   5. The inferior vena cava is normal in size with greater than 50%  respiratory variability, suggesting right atrial pressure of 3 mmHg.   EKG:  EKG normal sinus rhythm, right bundle branch block, isolated PVC, rightward/superior QRS axis.  Compared to the prior tracing that was performed October 22, 2021, the right bundle is new.  Recent Labs: 09/21/2021: Magnesium 2.1 10/01/2021: NT-Pro BNP 1,168 02/07/2022: ALT 54; BUN 12; Creatinine, Ser 0.88; Hemoglobin 14.3; Platelets 178; Potassium 4.4; Sodium 138  Recent Lipid Panel    Component Value Date/Time   CHOL 166 02/07/2022 1555   TRIG 178 (H) 02/07/2022 1555   HDL 47 02/07/2022 1555   CHOLHDL 3.5 02/07/2022 1555   CHOLHDL 3.6 08/16/2016 0925   VLDL 28 08/16/2016 0925   LDLCALC 89 02/07/2022 1555    Physical Exam:    VS:  BP 122/82   Pulse 60   Ht 6' (1.829 m)   Wt 237 lb 12.8 oz (107.9 kg)   SpO2 96%   BMI 32.25 kg/m  Wt Readings from Last 3 Encounters:  08/15/22 237 lb 12.8 oz (107.9 kg)  04/05/22 239 lb (108.4 kg)  02/07/22 240 lb 12.8 oz (109.2 kg)     GEN: Overweight. No acute  distress HEENT: Normal NECK: No JVD. LYMPHATICS: No lymphadenopathy CARDIAC: 1/6 right upper sternal and left mid sternal systolic but no diastolic murmur.  Irregular RR as would be heard with premature beats without gallops, or edema. VASCULAR:  Normal Pulses. No bruits. RESPIRATORY:  Clear to auscultation without rales, wheezing or rhonchi  ABDOMEN: Soft, non-tender, non-distended, No pulsatile mass, MUSCULOSKELETAL: No deformity  SKIN: Warm and dry NEUROLOGIC:  Alert and oriented x 3 PSYCHIATRIC:  Normal affect   ASSESSMENT:    1. S/P AVR   2. Paroxysmal atrial fibrillation (HCC)   3. Aortic atherosclerosis ([place])   4. Essential hypertension   5. OSA (obstructive sleep apnea)   6. Chronic anticoagulation   7. Ascending aorta dilation (HCC)   8. Right bundle branch block    PLAN:    In order of problems listed above:  Normally functioning bioprosthetic aortic valve without regurgitation or perivalvular leak. In sinus rhythm today.  No recurrence of atrial fibrillation clinically since last visit. Continue fish oil and statin therapy. Continue Toprol-XL. Compliance with CPAP is confirmed. Continue Eliquis therapy. Stable aorta based upon echo less than 12 months ago. Right bundle branch block, new on EKG performed this visit.  Needs 6 to 67-monthfollow-up.  He should be assigned a cardiologist who is not near retirement age.  Strongly encouraged aerobic activity to decrease cardiovascular risk.   Medication Adjustments/Labs and Tests Ordered: Current medicines are reviewed at length with the patient today.  Concerns regarding medicines are outlined above.  Orders Placed This Encounter  Procedures   EKG 12-Lead   No orders of the defined types were placed in this encounter.   Patient Instructions  Medication Instructions:  Your physician recommends that you continue on your current medications as directed. Please refer to the Current Medication list given to you  today.  *If you need a refill on your cardiac medications before your next appointment, please call your pharmacy*  Lab Work: NONE  Testing/Procedures: NONE  Follow-Up: [facility].  As part of our continuing mission to provide you with exceptional heart care, we have created designated Provider Care Teams.  These Care Teams include your primary Cardiologist (physician) and Advanced Practice Providers (APPs -  Physician Assistants and Nurse Practitioners) who all work together to provide you with the care you need, when you need it.  Your next appointment:   6 month(s)  The format for your next appointment:   In Person  Provider:   HSinclair Grooms MD    Important Information About Sugar         Signed, Jeremy Grooms MD  08/15/2022 1:24 PM    [place]

## 2022-08-15 ENCOUNTER — Encounter: Payer: Self-pay | Admitting: Interventional Cardiology

## 2022-08-15 ENCOUNTER — Ambulatory Visit: Payer: PPO | Attending: Interventional Cardiology | Admitting: Interventional Cardiology

## 2022-08-15 VITALS — BP 122/82 | HR 60 | Ht 72.0 in | Wt 237.8 lb

## 2022-08-15 DIAGNOSIS — I4510 Unspecified right bundle-branch block: Secondary | ICD-10-CM | POA: Diagnosis not present

## 2022-08-15 DIAGNOSIS — I10 Essential (primary) hypertension: Secondary | ICD-10-CM

## 2022-08-15 DIAGNOSIS — S52501A Unspecified fracture of the lower end of right radius, initial encounter for closed fracture: Secondary | ICD-10-CM | POA: Diagnosis not present

## 2022-08-15 DIAGNOSIS — I700 Atherosclerosis of aorta: Secondary | ICD-10-CM

## 2022-08-15 DIAGNOSIS — I77810 Thoracic aortic ectasia: Secondary | ICD-10-CM | POA: Diagnosis not present

## 2022-08-15 DIAGNOSIS — Z952 Presence of prosthetic heart valve: Secondary | ICD-10-CM | POA: Diagnosis not present

## 2022-08-15 DIAGNOSIS — I480 Paroxysmal atrial fibrillation: Secondary | ICD-10-CM | POA: Diagnosis not present

## 2022-08-15 DIAGNOSIS — G4733 Obstructive sleep apnea (adult) (pediatric): Secondary | ICD-10-CM

## 2022-08-15 DIAGNOSIS — Z7901 Long term (current) use of anticoagulants: Secondary | ICD-10-CM | POA: Diagnosis not present

## 2022-08-15 NOTE — Patient Instructions (Signed)
Medication Instructions:  Your physician recommends that you continue on your current medications as directed. Please refer to the Current Medication list given to you today.  *If you need a refill on your cardiac medications before your next appointment, please call your pharmacy*  Lab Work: NONE  Testing/Procedures: NONE  Follow-Up: At [facility], you and your health needs are our priority.  As part of our continuing mission to provide you with exceptional heart care, we have created designated Provider Care Teams.  These Care Teams include your primary Cardiologist (physician) and Advanced Practice Providers (APPs -  Physician Assistants and Nurse Practitioners) who all work together to provide you with the care you need, when you need it.  Your next appointment:   6 month(s)  The format for your next appointment:   In Person  Provider:   Henry W Smith III, MD   Important Information About Sugar       

## 2022-08-23 ENCOUNTER — Encounter: Payer: Self-pay | Admitting: Internal Medicine

## 2022-08-26 ENCOUNTER — Ambulatory Visit (INDEPENDENT_AMBULATORY_CARE_PROVIDER_SITE_OTHER): Payer: PPO

## 2022-08-26 VITALS — Ht 72.0 in | Wt 235.0 lb

## 2022-08-26 DIAGNOSIS — Z0000 Encounter for general adult medical examination without abnormal findings: Secondary | ICD-10-CM

## 2022-08-26 NOTE — Patient Instructions (Signed)
[name] , Thank you for taking time to come for your Medicare Wellness Visit. I appreciate your ongoing commitment to your health goals. Please review the following plan we discussed and let me know if I can assist you in the future.   Screening recommendations/referrals: Colonoscopy: completed 12/24/2015, due 12/23/2022 Recommended yearly ophthalmology/optometry visit for glaucoma screening and checkup Recommended yearly dental visit for hygiene and checkup  Vaccinations: Influenza vaccine: due Pneumococcal vaccine: completed 02/19/2018 Tdap vaccine: completed 09/07/2018, due 09/07/2028 Shingles vaccine: needs second dose   Covid-19:  09/07/2021, 10/02/2020, 01/09/2020, 12/19/2019  Advanced directives: Please bring a copy of your POA (Power of Attorney) and/or Living Will to your next appointment.   Conditions/risks identified: none  Next appointment: Follow up in one year for your annual wellness visit.   Preventive Care 72 Years and Older, Male Preventive care refers to lifestyle choices and visits with your health care provider that can promote health and wellness. What does preventive care include? A yearly physical exam. This is also called an annual well check. Dental exams once or twice a year. Routine eye exams. Ask your health care provider how often you should have your eyes checked. Personal lifestyle choices, including: Daily care of your teeth and gums. Regular physical activity. Eating a healthy diet. Avoiding tobacco and drug use. Limiting alcohol use. Practicing safe sex. Taking low doses of aspirin every day. Taking vitamin and mineral supplements as recommended by your health care provider. What happens during an annual well check? The services and screenings done by your health care provider during your annual well check will depend on your age, overall health, lifestyle risk factors, and family history of disease. Counseling  Your health care provider may ask  you questions about your: Alcohol use. Tobacco use. Drug use. Emotional well-being. Home and relationship well-being. Sexual activity. Eating habits. History of falls. Memory and ability to understand (cognition). Work and work Statistician. Screening  You may have the following tests or measurements: Height, weight, and BMI. Blood pressure. Lipid and cholesterol levels. These may be checked every 5 years, or more frequently if you are over 61 years old. Skin check. Lung cancer screening. You may have this screening every year starting at age 72 if you have a 30-pack-year history of smoking and currently smoke or have quit within the past 15 years. Fecal occult blood test (FOBT) of the stool. You may have this test every year starting at age 72. Flexible sigmoidoscopy or colonoscopy. You may have a sigmoidoscopy every 5 years or a colonoscopy every 10 years starting at age 72. Prostate cancer screening. Recommendations will vary depending on your family history and other risks. Hepatitis C blood test. Hepatitis B blood test. Sexually transmitted disease (STD) testing. Diabetes screening. This is done by checking your blood sugar (glucose) after you have not eaten for a while (fasting). You may have this done every 1-3 years. Abdominal aortic aneurysm (AAA) screening. You may need this if you are a current or former smoker. Osteoporosis. You may be screened starting at age 72 if you are at high risk. Talk with your health care provider about your test results, treatment options, and if necessary, the need for more tests. Vaccines  Your health care provider may recommend certain vaccines, such as: Influenza vaccine. This is recommended every year. Tetanus, diphtheria, and acellular pertussis (Tdap, Td) vaccine. You may need a Td booster every 10 years. Zoster vaccine. You may need this after age 72. Pneumococcal 13-valent conjugate (PCV13) vaccine.  One dose is recommended after age  72. Pneumococcal polysaccharide (PPSV23) vaccine. One dose is recommended after age 72. Talk to your health care provider about which screenings and vaccines you need and how often you need them. This information is not intended to replace advice given to you by your health care provider. Make sure you discuss any questions you have with your health care provider. Document Released: 11/27/2015 Document Revised: 07/20/2016 Document Reviewed: 09/01/2015 Elsevier Interactive Patient Education  2017 [place] Prevention in the Home Falls can cause injuries. They can happen to people of all ages. There are many things you can do to make your home safe and to help prevent falls. What can I do on the outside of my home? Regularly fix the edges of walkways and driveways and fix any cracks. Remove anything that might make you trip as you walk through a door, such as a raised step or threshold. Trim any bushes or trees on the path to your home. Use bright outdoor lighting. Clear any walking paths of anything that might make someone trip, such as rocks or tools. Regularly check to see if handrails are loose or broken. Make sure that both sides of any steps have handrails. Any raised decks and porches should have guardrails on the edges. Have any leaves, snow, or ice cleared regularly. Use sand or salt on walking paths during winter. Clean up any spills in your garage right away. This includes oil or grease spills. What can I do in the bathroom? Use night lights. Install grab bars by the toilet and in the tub and shower. Do not use towel bars as grab bars. Use non-skid mats or decals in the tub or shower. If you need to sit down in the shower, use a plastic, non-slip stool. Keep the floor dry. Clean up any water that spills on the floor as soon as it happens. Remove soap buildup in the tub or shower regularly. Attach bath mats securely with double-sided non-slip rug tape. Do not have throw  rugs and other things on the floor that can make you trip. What can I do in the bedroom? Use night lights. Make sure that you have a light by your bed that is easy to reach. Do not use any sheets or blankets that are too big for your bed. They should not hang down onto the floor. Have a firm chair that has side arms. You can use this for support while you get dressed. Do not have throw rugs and other things on the floor that can make you trip. What can I do in the kitchen? Clean up any spills right away. Avoid walking on wet floors. Keep items that you use a lot in easy-to-reach places. If you need to reach something above you, use a strong step stool that has a grab bar. Keep electrical cords out of the way. Do not use floor polish or wax that makes floors slippery. If you must use wax, use non-skid floor wax. Do not have throw rugs and other things on the floor that can make you trip. What can I do with my stairs? Do not leave any items on the stairs. Make sure that there are handrails on both sides of the stairs and use them. Fix handrails that are broken or loose. Make sure that handrails are as long as the stairways. Check any carpeting to make sure that it is firmly attached to the stairs. Fix any carpet that is loose or  worn. Avoid having throw rugs at the top or bottom of the stairs. If you do have throw rugs, attach them to the floor with carpet tape. Make sure that you have a light switch at the top of the stairs and the bottom of the stairs. If you do not have them, ask someone to add them for you. What else can I do to help prevent falls? Wear shoes that: Do not have high heels. Have rubber bottoms. Are comfortable and fit you well. Are closed at the toe. Do not wear sandals. If you use a stepladder: Make sure that it is fully opened. Do not climb a closed stepladder. Make sure that both sides of the stepladder are locked into place. Ask someone to hold it for you, if  possible. Clearly mark and make sure that you can see: Any grab bars or handrails. First and last steps. Where the edge of each step is. Use tools that help you move around (mobility aids) if they are needed. These include: Canes. Walkers. Scooters. Crutches. Turn on the lights when you go into a dark area. Replace any light bulbs as soon as they burn out. Set up your furniture so you have a clear path. Avoid moving your furniture around. If any of your floors are uneven, fix them. If there are any pets around you, be aware of where they are. Review your medicines with your doctor. Some medicines can make you feel dizzy. This can increase your chance of falling. Ask your doctor what other things that you can do to help prevent falls. This information is not intended to replace advice given to you by your health care provider. Make sure you discuss any questions you have with your health care provider. Document Released: 08/27/2009 Document Revised: 04/07/2016 Document Reviewed: 12/05/2014 Elsevier Interactive Patient Education  2017 [employer].

## 2022-08-26 NOTE — Progress Notes (Signed)
I connected with Jeremy Tucker today by telephone and verified that I am speaking with the correct person using two identifiers. Location patient: home Location provider: work Persons participating in the virtual visit: Krishiv, Sandler LPN.   I discussed the limitations, risks, security and privacy concerns of performing an evaluation and management service by telephone and the availability of in person appointments. I also discussed with the patient that there may be a patient responsible charge related to this service. The patient expressed understanding and verbally consented to this telephonic visit.    Interactive audio and video telecommunications were attempted between this provider and patient, however failed, due to patient having technical difficulties OR patient did not have access to video capability.  We continued and completed visit with audio only.     Vital signs may be patient reported or missing.  Subjective:   SALAAM BATTERSHELL is a 71 y.o. male who presents for Medicare Annual/Subsequent preventive examination.  Review of Systems     Cardiac Risk Factors include: advanced age (>33mn, >>23women);diabetes mellitus;dyslipidemia;hypertension;male gender;obesity (BMI >30kg/m2)     Objective:    Today's Vitals   08/26/22 0956  Weight: 235 lb (106.6 kg)  Height: 6' (1.829 m)   Body mass index is 31.87 kg/m.     08/26/2022   10:10 AM 12/15/2021    1:23 PM 09/20/2021    9:47 AM 09/16/2021    2:34 PM 09/15/2021    1:32 PM 08/04/2021   10:07 AM 08/03/2021   10:13 AM  Advanced Directives  Does Patient Have a Medical Advance Directive? Yes Yes Yes Yes Yes Yes Yes  Type of AParamedicof AVevayLiving will HSpringfieldLiving will HLivonia CenterLiving will HAltusLiving will  Living will;Healthcare Power of [facility]Ot[facility] HospitalLiving will;Healthcare Power of AConesvilleOut of facility DNR (pink MOST or  yellow form)  Does patient want to make changes to medical advance directive?  No - Patient declined No - Patient declined No - Patient declined  No - Patient declined   Copy of HArenas Valleyin Chart? No - copy requested No - copy requested No - copy requested No - copy requested  No - copy requested No - copy requested    Current Medications (verified) Outpatient Encounter Medications as of 08/26/2022  Medication Sig   acetaminophen (TYLENOL) 500 MG tablet Take 1,000 mg by mouth every 8 (eight) hours as needed for mild pain.    albuterol (VENTOLIN HFA) 108 (90 Base) MCG/ACT inhaler INHALE 2 PUFFS INTO THE LUNGS EVERY 6 HOURS AS NEEDED FOR WHEEZING OR SHORTNESS OF BREATH   allopurinol (ZYLOPRIM) 300 MG tablet TAKE ONE TABLET BY MOUTH DAILY   ammonium lactate (LAC-HYDRIN) 12 % lotion Apply 1 application topically daily as needed for dry skin.   apixaban (ELIQUIS) 5 MG TABS tablet Take 1 tablet (5 mg total) by mouth 2 (two) times daily.   aspirin EC 81 MG tablet Take 1 tablet (81 mg total) by mouth daily. Swallow whole.   buPROPion HCl (WELLBUTRIN PO) Take 300 mg by mouth daily. Pt takes 1/2 tablet once a day.   fluticasone (FLONASE) 50 MCG/ACT nasal spray Place 2 sprays into both nostrils daily.   loratadine (CLARITIN) 10 MG tablet Take 10 mg by mouth in the morning.   metoprolol succinate (TOPROL XL) 25 MG 24 hr tablet Take 1 tablet (25 mg total) by mouth daily.   Multiple Vitamins-Minerals (MULTIVITAMIN WITH MINERALS) tablet  Take 1 tablet by mouth daily.   olopatadine (PATANOL) 0.1 % ophthalmic solution Place 1 drop into both eyes daily as needed for allergies (tearing).    Omega-3 Fatty Acids (FISH OIL) 1000 MG CAPS Take 1,000 mg by mouth daily.   omeprazole (PRILOSEC) 20 MG capsule Take 20 mg by mouth daily.   OZEMPIC, 0.25 OR 0.5 MG/DOSE, 2 MG/3ML SOPN DIAL AND INJECT UNDER THE SKIN 0.5 MG WEEKLY   rosuvastatin (CRESTOR) 40 MG tablet Take 1 tablet (40 mg total) by mouth  daily.   sertraline (ZOLOFT) 50 MG tablet Take 1 tablet (50 mg total) by mouth daily.   sildenafil (VIAGRA) 100 MG tablet Take 1 tablet (100 mg total) by mouth daily as needed for erectile dysfunction.   tamsulosin (FLOMAX) 0.4 MG CAPS capsule Take 0.4 mg by mouth daily. Pt takes 1 tablet daily.   buPROPion (WELLBUTRIN XL) 300 MG 24 hr tablet TAKE ONE TABLET BY MOUTH DAILY   fluticasone-salmeterol (ADVAIR) 250-50 MCG/ACT AEPB INHALE 1 PUFF INTO THE LUNGS TWO TIMES A DAY (Patient not taking: Reported on 08/26/2022)   LORazepam (ATIVAN) 0.5 MG tablet TAKE 1 TABLET TWICE DAILY AS NEEDED FOR ANXIETY. (Patient not taking: Reported on 08/26/2022)   traZODone (DESYREL) 50 MG tablet TAKE 1/2 TO 1 TABLETS BY MOUTH AS NEEDED FOR SLEEP (Patient not taking: Reported on 08/26/2022)   zolpidem (AMBIEN) 5 MG tablet Take 1 tablet (5 mg total) by mouth at bedtime as needed for sleep. (Patient not taking: Reported on 08/26/2022)   No facility-administered encounter medications on file as of 08/26/2022.    Allergies (verified) Oxycodone   History: Past Medical History:  Diagnosis Date   Anxiety    BPH (benign prostatic hyperplasia)    BPH without obstruction/lower urinary tract symptoms    Depression    Diabetic peripheral neuropathy ([place]) 01/07/2019   Dyspnea    w/exertion   ED (erectile dysfunction)    Essential hypertension    GERD (gastroesophageal reflux disease)    Hiatal hernia    History of chronic bronchitis    per pt used inhaler as needed   History of DVT of lower extremity 2001   s/p  left TKA completed blood thinner treatment,  per pt no previous clot and none since   History of gastric polyp    per pt benign   History of kidney stones    Hyperlipidemia    Mild CAD cardiologist--- dr h. Tamala Julian   nuclear study 04-14-2017 normal , low risk, nuclear ef 53%;  cardiac cath 09-19-2018  midRCA 30% stenosis otherwise normal coronaries,  heavy AV calicification with decreased mobility with  peak grandiant 52mHg   Mild dilation of ascending aorta ([place]    per echo 01-13-2021  339m  Nephrolithiasis    OA (osteoarthritis)    OSA (obstructive sleep apnea)    per pt refused cpap, intolerant;  moderate osa per study in epic 03-08-2015   PAF (paroxysmal atrial fibrillation) ([place]   followed by dr h. 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   [facility] dr dahlstedt/  oncology-- dr maTammi Klippel first dx 03/ 2021,  Glesaon 3+4  PSA 6.95   Pulmonary nodule 2019   incidently finding on CTA 09-25-2018, stable   Right carpal tunnel syndrome 01/07/2019   Severe aortic stenosis    Type 2 diabetes mellitus ([place]   followed by  pcp   (03-02-2021  pt stated does not check blood sugar at home)   Umbilical hernia    Past Surgical History:  Procedure Laterality Date   AORTIC VALVE REPLACEMENT N/A 09/20/2021   Procedure: AORTIC VALVE REPLACEMENT (AVR) USING 25 MM INSPIRIS RESILIA  AORTIC VALVE;  Surgeon: [name], MD;  Location: [place];  Service: Open Heart Surgery;  Laterality: N/A;   ATRIAL FIBRILLATION ABLATION N/A 03/12/2015   Procedure: ATRIAL FIBRILLATION ABLATION;  Surgeon: [name], MD;  Location: [facility] CATH LAB;  Service: Cardiovascular;  Laterality: N/A;   CARDIAC CATHETERIZATION  1990's   "Dr. Melvern Banker", no blockages per pt   CARDIOVERSION N/A 02/25/2015   Procedure: CARDIOVERSION;  Surgeon: Sueanne Margarita, MD;  Location: [place];  Service: Cardiovascular;  Laterality: N/A;   CATARACT EXTRACTION W/ INTRAOCULAR LENS  IMPLANT, BILATERAL  2016   CLIPPING OF ATRIAL APPENDAGE Left 09/20/2021   Procedure: CLIPPING OF ATRIAL APPENDAGE USING ATRICURE 37 MM ATRICLIP;  Surgeon: [name], MD;  Location: [place];  Service: Open Heart Surgery;  Laterality: Left;   COLONOSCOPY  last one 2013  dr Earlean Shawl   CYSTOSCOPY N/A 03/08/2021   Procedure: CYSTOSCOPY BLADDER CALCULI EXTRACTION ;   Surgeon: Franchot Gallo, MD;  Location: [facility];  Service: Urology;  Laterality: N/A;   CYSTOSCOPY WITH RETROGRADE PYELOGRAM, URETEROSCOPY AND STENT PLACEMENT Left 04/24/2015   Procedure: URETHRAL MEATAL DILATION, CYSTOSCOPY WITH LEFT RETROGRADE PYELOGRAM, LEFT URETEROSCOPY, STONE BASKET EXTRACTION,  LEFT DOUBLE J STENT PLACEMENT;  Surgeon: Carolan Clines, MD;  Location: [facility];  Service: Urology;  Laterality: Left;   EP IMPLANTABLE DEVICE N/A 09/15/2015   Procedure: Loop Recorder Insertion;  Surgeon: [name], MD;  Location: [place] CV LAB;  Service: Cardiovascular;  Laterality: N/A;   ESOPHAGOGASTRODUODENOSCOPY  last one 2017   EXTRACORPOREAL SHOCK WAVE LITHOTRIPSY Bilateral 04/02/2020   Procedure: EXTRACORPOREAL SHOCK WAVE LITHOTRIPSY (ESWL);  Surgeon: Cleon Gustin, MD;  Location: [facility];  Service: Urology;  Laterality: Bilateral;   EXTRACORPOREAL SHOCK WAVE LITHOTRIPSY Left 04/06/2020   Procedure: EXTRACORPOREAL SHOCK WAVE LITHOTRIPSY (ESWL);  Surgeon: [name], MD;  Location: [facility];  Service: Urology;  Laterality: Left;   EYE SURGERY     HOLMIUM LASER APPLICATION Left 56/25/6389   Procedure: HOLMIUM LASER APPLICATION;  Surgeon: Carolan Clines, MD;  Location[facility];  Service: Urology;  Laterality: Left;   implantable loopr recorder removal  02/22/2021   MDT LINQ removed in office by Dr Allred   JOINT REPLACEMENT     KNEE ARTHROSCOPY Bilateral "multiple times"   LEFT HEART CATH AND CORONARY ANGIOGRAPHY N/A 09/19/2018   Procedure: LEFT HEART CATH AND CORONARY ANGIOGRAPHY;  Surgeon: Belva Crome, MD;  Location: [place] CV LAB;  Service: Cardiovascular;  Laterality: N/A;   PERCUTANEOUS NEPHROSTOLITHOTOMY  1980s and 1990s   RADIOACTIVE SEED IMPLANT N/A 03/08/2021   Procedure: RADIOACTIVE SEED IMPLANT/BRACHYTHERAPY IMPLANT;  Surgeon: Franchot Gallo, MD;  Location: [facility];  Service: Urology;  Laterality: N/A;  90 MINS   RIGHT/LEFT HEART CATH AND CORONARY ANGIOGRAPHY N/A 08/04/2021   Procedure: RIGHT/LEFT HEART CATH AND CORONARY ANGIOGRAPHY;  Surgeon: Belva Crome, MD;  Location: [place] CV LAB;  Service: Cardiovascular;  Laterality: N/A;   SINUS ENDO WITH FUSION Bilateral 12/24/2021   Procedure: BILATERAL ENDOSCOPIC SINUS SURGERY WITH INTRANASAL POLYPECTOMY AND FUSION NAVIGATION;  Surgeon: Jerrell Belfast, MD;  Location: [place];  Service: ENT;  Laterality: Bilateral;  SKIN BIOPSY Left 05/06/2020   Shave biopsy left posterior hand neurofiberoma.   SPACE OAR INSTILLATION N/A 03/08/2021   Procedure: SPACE OAR INSTILLATION;  Surgeon: Franchot Gallo, MD;  Location: [facility];  Service: Urology;  Laterality: N/A;   TEE WITHOUT CARDIOVERSION N/A 03/12/2015   Procedure: TRANSESOPHAGEAL ECHOCARDIOGRAM (TEE);  Surgeon: Pixie Casino, MD;  Location: [place];  Service: Cardiovascular;  Laterality: N/A;   TEE WITHOUT CARDIOVERSION N/A 09/20/2021   Procedure: TRANSESOPHAGEAL ECHOCARDIOGRAM (TEE);  Surgeon: [name], MD;  Location: [place];  Service: Open Heart Surgery;  Laterality: N/A;   TONSILLECTOMY  age 66   TOTAL KNEE ARTHROPLASTY Left 11-22-1999  '@[facility]'$    TOTAL KNEE ARTHROPLASTY Right 10/30/2017   Procedure: RIGHT TOTAL KNEE ARTHROPLASTY;  Surgeon: Paralee Cancel, MD;  [facility];  Service: Orthopedics;  Laterality: Right;  90 mins   UMBILICAL HERNIA REPAIR N/A 07/05/2021   Procedure: REPAIR INCARCERATED UMBILICAL HERNIA WITH MESH;  Surgeon: Georganna Skeans, MD;  Location: [facility] OR;  Service: General;  Laterality: N/A;   Family History  Problem Relation Age of Onset   Heart attack Mother 69       MI   Heart attack Father 65       ? MI versus trauma to head   Healthy Brother    Cancer Maternal Grandfather        had prostate removed   Breast cancer Neg Hx    Colon cancer Neg Hx    Pancreatic cancer Neg Hx    Prostate  cancer Neg Hx    Social History   Socioeconomic History   Marital status: Married    Spouse name: Not on file   Number of children: 1   Years of education: Not on file   Highest education level: Some college, no degree  Occupational History   Occupation: Retired  Tobacco Use   Smoking status: Never   Smokeless tobacco: Never  Vaping Use   Vaping Use: Never used  Substance and Sexual Activity   Alcohol use: Not Currently    Comment: very rare   Drug use: Never   Sexual activity: Yes  Other Topics Concern   Not on file  Social History Narrative   Pt lives in [place] with spouse.  57 yo son is health.  Second son died in car accident.      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 [address]   Right handed   Caffeine 1-2 cups dail    Social Determinants of Health   Financial Resource Strain: Low Risk  (08/26/2022)   Overall Financial Resource Strain (CARDIA)    Difficulty of Paying Living Expenses: Not hard at all  Food Insecurity: No Food Insecurity (08/26/2022)   Hunger Vital Sign    Worried About Running Out of Food in the Last Year: Never true    Ran Out of Food in the Last Year: Never true  Transportation Needs: No Transportation Needs (08/26/2022)   PRAPARE - Hydrologist (Medical): No    Lack of Transportation (Non-Medical): No  Physical Activity: Inactive (08/26/2022)   Exercise Vital Sign    Days of Exercise per Week: 0 days    Minutes of Exercise per Session: 0 min  Stress: No Stress Concern Present (08/26/2022)   [place]    Feeling of Stress : Not at all  Social Connections: Not on file    Tobacco Counseling Counseling given: Not Answered  Clinical Intake:  Pre-visit preparation completed: Yes  Pain : No/denies pain     Nutritional Status: BMI > 30  Obese Nutritional Risks: None Diabetes: Yes  How often do you need to have someone help you when you read  instructions, pamphlets, or other written materials from your doctor or pharmacy?: 1 - Never What is the last grade level you completed in school?: 52yrhalf college  Diabetic? Yes Nutrition Risk Assessment:  Has the patient had any N/V/D within the last 2 months?  No  Does the patient have any non-healing wounds?  No  Has the patient had any unintentional weight loss or weight gain?  No   Diabetes:  Is the patient diabetic?  Yes  If diabetic, was a CBG obtained today?  No  Did the patient bring in their glucometer from home?  No  How often do you monitor your CBG's? Very little.   Financial Strains and Diabetes Management:  Are you having any financial strains with the device, your supplies or your medication? No .  Does the patient want to be seen by Chronic Care Management for management of their diabetes?  No  Would the patient like to be referred to a Nutritionist or for Diabetic Management?  No   Diabetic Exams:  Diabetic Eye Exam: Overdue for diabetic eye exam. Pt has been advised about the importance in completing this exam. Patient advised to call and schedule an eye exam. Diabetic Foot Exam: Overdue, Pt has been advised about the importance in completing this exam. Pt is scheduled for diabetic foot exam on next appointment.   Interpreter Needed?: No  Information entered by :: NAllen LPN   Activities of Daily Living    08/26/2022   10:11 AM 12/15/2021    1:32 PM  In your present state of health, do you have any difficulty performing the following activities:  Hearing? 0   Vision? 0   Difficulty concentrating or making decisions? 0   Walking or climbing stairs? 0   Dressing or bathing? 0   Doing errands, shopping? 0 0  Preparing Food and eating ? N   Using the Toilet? N   In the past six months, have you accidently leaked urine? N   Do you have problems with loss of bowel control? N   Managing your Medications? N   Managing your Finances? N   Housekeeping or  managing your Housekeeping? N     Patient Care Team: LDenita Lung MD as PCP - General (Family Medicine) SBelva Crome MD as PCP - Cardiology (Cardiology) AThompson Grayer MD as PCP - Electrophysiology (Cardiology) NJovita Gamma MD as Consulting Physician (Neurosurgery) OParalee Cancel MD as Consulting Physician (Orthopedic Surgery) RCira Rue RN as Oncology Nurse Navigator  Indicate any recent MLake Almanor Westyou may have received from other than [facility] providers in the past year (date may be approximate).     Assessment:   This is a routine wellness examination for [place]  Hearing/Vision screen Vision Screening - Comments:: Regular eye exams, [facility]  Dietary issues and exercise activities discussed: Current Exercise Habits: The patient does not participate in regular exercise at present   Goals Addressed             This Visit's Progress    Patient Stated       08/26/2022, no goals       Depression Screen    08/26/2022   10:11 AM 08/03/2021   10:14 AM 03/04/2021  12:42 PM 11/19/2018    9:55 AM  PHQ 2/9 Scores  PHQ - 2 Score 0 0 0 3  PHQ- 9 Score 0   4    Fall Risk    08/26/2022   10:10 AM 08/03/2021   10:13 AM 03/04/2021   12:42 PM 08/14/2017    8:41 AM  Fall Risk   Falls in the past year? 0 0 0 No  Number falls in past yr: 0 0 0   Injury with Fall? 0 0 0   Risk for fall due to : Medication side effect No Fall Risks No Fall Risks   Follow up Falls prevention discussed;Education provided;Falls evaluation completed Falls evaluation completed Falls evaluation completed     FALL RISK PREVENTION PERTAINING TO THE HOME:  Any stairs in or around the home? No  If so, are there any without handrails? N/a Home free of loose throw rugs in walkways, pet beds, electrical cords, etc? Yes  Adequate lighting in your home to reduce risk of falls? Yes   ASSISTIVE DEVICES UTILIZED TO PREVENT FALLS:  Life alert? No  Use of a cane, walker or w/c? No  Grab  bars in the bathroom? No  Shower chair or bench in shower? No  Elevated toilet seat or a handicapped toilet? Yes   TIMED UP AND GO:  Was the test performed? No .       Cognitive Function:        08/26/2022   10:13 AM  6CIT Screen  What Year? 0 points  What month? 0 points  What time? 0 points  Count back from 20 0 points  Months in reverse 0 points  Repeat phrase 0 points  Total Score 0 points    Immunizations Immunization History  Administered Date(s) Administered   Fluad Quad(high Dose 65+) 10/02/2020, 08/03/2021   PFIZER(Purple Top)SARS-COV-2 Vaccination 12/19/2019, 01/09/2020, 10/02/2020   Pfizer Covid-19 Vaccine Bivalent Booster 58yr & up 09/07/2021   Pneumococcal Conjugate-13 08/16/2016   Pneumococcal Polysaccharide-23 02/19/2018   Tdap 05/31/2006, 09/07/2018   Zoster Recombinat (Shingrix) 09/07/2018   Zoster, Live 04/23/2012    TDAP status: Up to date  Flu Vaccine status: Due, Education has been provided regarding the importance of this vaccine. Advised may receive this vaccine at local pharmacy or Health Dept. Aware to provide a copy of the vaccination record if obtained from local pharmacy or Health Dept. Verbalized acceptance and understanding.  Pneumococcal vaccine status: Up to date  Covid-19 vaccine status: Completed vaccines  Qualifies for Shingles Vaccine? Yes   Zostavax completed Yes   Shingrix Completed?: needs second dose  Screening Tests Health Maintenance  Topic Date Due   Zoster Vaccines- Shingrix (2 of 2) 11/02/2018   COVID-19 Vaccine (5 - Pfizer risk series) 11/02/2021   INFLUENZA VACCINE  06/14/2022   OPHTHALMOLOGY EXAM  07/20/2022   Diabetic kidney evaluation - Urine ACR  08/03/2022   FOOT EXAM  08/03/2022   HEMOGLOBIN A1C  08/10/2022   Diabetic kidney evaluation - GFR measurement  02/08/2023   COLONOSCOPY (Pts 45-472yrInsurance coverage will need to be confirmed)  12/23/2025   TETANUS/TDAP  09/07/2028   Pneumonia Vaccine 71 Years old  Completed   Hepatitis C Screening  Completed   HPV VACCINES  Aged Out    Health Maintenance  Health Maintenance Due  Topic Date Due   Zoster Vaccines- Shingrix (2 of 2) 11/02/2018   COVID-19 Vaccine (5 - Pfizer risk series) 11/02/2021   INFLUENZA VACCINE  06/14/2022  OPHTHALMOLOGY EXAM  07/20/2022   Diabetic kidney evaluation - Urine ACR  08/03/2022   FOOT EXAM  08/03/2022   HEMOGLOBIN A1C  08/10/2022    Colorectal cancer screening: Type of screening: Colonoscopy. Completed 12/24/2015. Repeat every 7 years  Lung Cancer Screening: (Low Dose CT Chest recommended if Age 18-80 years, 30 pack-year currently smoking OR have quit w/in 15years.) does not qualify.   Lung Cancer Screening Referral: no  Additional Screening:  Hepatitis C Screening: does qualify; Completed 03/21/2016  Vision Screening: Recommended annual ophthalmology exams for early detection of glaucoma and other disorders of the eye. Is the patient up to date with their annual eye exam?  Yes  Who is the provider or what is the name of the office in which the patient attends annual eye exams? [facility]  If pt is not established with a provider, would they like to be referred to a provider to establish care? No .   Dental Screening: Recommended annual dental exams for proper oral hygiene  Community Resource Referral / Chronic Care Management: CRR required this visit?  No   CCM required this visit?  No      Plan:     I have personally reviewed and noted the following in the patient's chart:   Medical and social history Use of alcohol, tobacco or illicit drugs  Current medications and supplements including opioid prescriptions. Patient is not currently taking opioid prescriptions. Functional ability and status Nutritional status Physical activity Advanced directives List of other physicians Hospitalizations, surgeries, and ER visits in previous 12 months Vitals Screenings to include cognitive,  depression, and falls Referrals and appointments  In addition, I have reviewed and discussed with patient certain preventive protocols, quality metrics, and best practice recommendations. A written personalized care plan for preventive services as well as general preventive health recommendations were provided to patient.     Kellie Simmering, LPN   [id number]/01/[id number]   Nurse Notes: none

## 2022-08-28 ENCOUNTER — Other Ambulatory Visit: Payer: Self-pay | Admitting: Interventional Cardiology

## 2022-08-28 DIAGNOSIS — I480 Paroxysmal atrial fibrillation: Secondary | ICD-10-CM

## 2022-08-29 NOTE — Telephone Encounter (Signed)
Eliquis '5mg'$  refill request received. Patient is 71 years old, weight-106.6kg, Crea-0.88 on 02/07/2022, Diagnosis-Afib, and last seen by Dr. Tamala Julian on 08/15/2022. Dose is appropriate based on dosing criteria. Will send in refill to requested pharmacy.

## 2022-08-31 ENCOUNTER — Telehealth: Payer: Self-pay | Admitting: Family Medicine

## 2022-08-31 MED ORDER — SEMAGLUTIDE (1 MG/DOSE) 4 MG/3ML ~~LOC~~ SOPN: 1.0000 mg | PEN_INJECTOR | SUBCUTANEOUS | 1 refills | Status: DC

## 2022-08-31 NOTE — Telephone Encounter (Signed)
He thought his Ozempic dose was supposed to increase??  Please call, he will be at dentist appt @ 2:00

## 2022-09-05 ENCOUNTER — Encounter: Payer: Self-pay | Admitting: Internal Medicine

## 2022-09-09 DIAGNOSIS — N401 Enlarged prostate with lower urinary tract symptoms: Secondary | ICD-10-CM | POA: Diagnosis not present

## 2022-09-12 DIAGNOSIS — L089 Local infection of the skin and subcutaneous tissue, unspecified: Secondary | ICD-10-CM | POA: Diagnosis not present

## 2022-09-12 DIAGNOSIS — L821 Other seborrheic keratosis: Secondary | ICD-10-CM | POA: Diagnosis not present

## 2022-09-16 DIAGNOSIS — N401 Enlarged prostate with lower urinary tract symptoms: Secondary | ICD-10-CM | POA: Diagnosis not present

## 2022-09-16 DIAGNOSIS — R351 Nocturia: Secondary | ICD-10-CM | POA: Diagnosis not present

## 2022-09-16 DIAGNOSIS — N5201 Erectile dysfunction due to arterial insufficiency: Secondary | ICD-10-CM | POA: Diagnosis not present

## 2022-09-27 ENCOUNTER — Other Ambulatory Visit (INDEPENDENT_AMBULATORY_CARE_PROVIDER_SITE_OTHER): Payer: PPO

## 2022-09-27 DIAGNOSIS — Z23 Encounter for immunization: Secondary | ICD-10-CM

## 2022-09-29 ENCOUNTER — Other Ambulatory Visit: Payer: Self-pay | Admitting: Family Medicine

## 2022-09-29 DIAGNOSIS — N529 Male erectile dysfunction, unspecified: Secondary | ICD-10-CM

## 2022-10-18 ENCOUNTER — Ambulatory Visit (INDEPENDENT_AMBULATORY_CARE_PROVIDER_SITE_OTHER): Payer: PPO | Admitting: Family Medicine

## 2022-10-18 ENCOUNTER — Other Ambulatory Visit: Payer: Self-pay | Admitting: Family Medicine

## 2022-10-18 ENCOUNTER — Encounter: Payer: Self-pay | Admitting: Family Medicine

## 2022-10-18 VITALS — BP 128/70 | HR 57 | Wt 235.0 lb

## 2022-10-18 DIAGNOSIS — I152 Hypertension secondary to endocrine disorders: Secondary | ICD-10-CM | POA: Diagnosis not present

## 2022-10-18 DIAGNOSIS — E118 Type 2 diabetes mellitus with unspecified complications: Secondary | ICD-10-CM | POA: Diagnosis not present

## 2022-10-18 DIAGNOSIS — N529 Male erectile dysfunction, unspecified: Secondary | ICD-10-CM | POA: Diagnosis not present

## 2022-10-18 DIAGNOSIS — Z87442 Personal history of urinary calculi: Secondary | ICD-10-CM

## 2022-10-18 DIAGNOSIS — E1159 Type 2 diabetes mellitus with other circulatory complications: Secondary | ICD-10-CM

## 2022-10-18 DIAGNOSIS — Z7901 Long term (current) use of anticoagulants: Secondary | ICD-10-CM | POA: Diagnosis not present

## 2022-10-18 DIAGNOSIS — J4531 Mild persistent asthma with (acute) exacerbation: Secondary | ICD-10-CM | POA: Diagnosis not present

## 2022-10-18 DIAGNOSIS — K219 Gastro-esophageal reflux disease without esophagitis: Secondary | ICD-10-CM | POA: Diagnosis not present

## 2022-10-18 DIAGNOSIS — F32A Depression, unspecified: Secondary | ICD-10-CM

## 2022-10-18 LAB — POCT GLYCOSYLATED HEMOGLOBIN (HGB A1C): Hemoglobin A1C: 7.3 % — AB (ref 4.0–5.6)

## 2022-10-18 MED ORDER — SEMAGLUTIDE (2 MG/DOSE) 8 MG/3ML ~~LOC~~ SOPN: 2.0000 mg | PEN_INJECTOR | SUBCUTANEOUS | 5 refills | Status: DC

## 2022-10-18 MED ORDER — FLUTICASONE-SALMETEROL 250-50 MCG/ACT IN AEPB: 1.0000 | INHALATION_SPRAY | Freq: Two times a day (BID) | RESPIRATORY_TRACT | 5 refills | Status: DC

## 2022-10-18 NOTE — Progress Notes (Signed)
Subjective:    Patient ID: Jeremy Tucker, male    DOB: Jan 26, 1951, 71 y.o.   MRN: [id number]  Jeremy Tucker is a 71 y.o. male who presents for follow-up of Type 2 diabetes mellitus.  Home blood sugar records:  blood sugars are not checked Current symptoms/problems include visual disturbances and have been stable. Daily foot checks: yes   Any foot concerns: none How often blood sugars checked: Exercise: The patient does not participate in regular exercise at present. Diet: regular diet He is taking 1 mg of Ozempic and has lost roughly 6 pounds.  He has not gotten involved in a regular exercise program and cites no particular reason why he has not.  He is not taking any sleep medications.  He has not been using Advair but does admit to feeling wheezing on several occasions.  He uses sildenafil on an as-needed basis.  Continues on Eliquis as well as Flomax.  He is doing nicely on Wellbutrin 150 mg and sertraline.  His home life is going quite well. The following portions of the patient's history were reviewed and updated as appropriate: allergies, current medications, past medical history, past social history and problem list.  ROS as in subjective above.     Objective:    Physical Exam Alert and in no distress otherwise not examined. Hemoglobin A1c is 7.3 Blood pressure 128/70, pulse (!) 57, weight 235 lb (106.6 kg), SpO2 96 %.  Lab Review    Latest Ref Rng & Units 10/18/2022   11:46 AM 02/07/2022    3:55 PM 02/07/2022    3:43 PM 12/15/2021    1:38 PM 11/02/2021    8:48 AM  Diabetic Labs  HbA1c 4.0 - 5.6 % 7.3   7.4  7.1    Chol 100 - 199 mg/dL  166      HDL >39 mg/dL  47      Calc LDL 0 - 99 mg/dL  89      Triglycerides 0 - 149 mg/dL  178      Creatinine 0.76 - 1.27 mg/dL  0.88   0.91  0.83       10/18/2022   11:32 AM 08/26/2022    9:56 AM 08/15/2022   11:42 AM 04/05/2022   10:24 AM 02/07/2022    3:14 PM  BP/Weight  Systolic BP 403  474 259 563  Diastolic BP 70  82 86 78  Wt.  (Lbs) 235 235 237.8 239 240.8  BMI 31.87 kg/m2 31.87 kg/m2 32.25 kg/m2 32.41 kg/m2 32.66 kg/m2      08/03/2021   10:30 AM 02/19/2018   10:45 AM  Foot/eye exam completion dates  Foot Form Completion Done Done    Jeremy Tucker  reports that he has never smoked. He has never used smokeless tobacco. He reports that he does not currently use alcohol. He reports that he does not use drugs.     Assessment & Plan:    Controlled type 2 diabetes mellitus with complication, without long-term current use of insulin ([place]) - Plan: POCT glycosylated hemoglobin (Hb A1C), Semaglutide, 2 MG/DOSE, 8 MG/3ML SOPN  Mild persistent asthma with acute exacerbation - Plan: fluticasone-salmeterol (ADVAIR) 250-50 MCG/ACT AEPB  Hypertension associated with diabetes ([place])  Gastroesophageal reflux disease without esophagitis  Erectile dysfunction, unspecified erectile dysfunction type  Chronic anticoagulation I had a long discussion with him concerning his lack of physical activity and its effect on his health.  Strongly encouraged him to get involved in an exercise program and  specifically mentioned Silver sneakers.  I will also add Advair back to his regimen since he is having more difficulty with that in the cares to I will increase his Ozempic to see if that will help him further with his diabetes and weight loss.

## 2022-10-25 ENCOUNTER — Other Ambulatory Visit: Payer: Self-pay | Admitting: Family Medicine

## 2022-10-27 ENCOUNTER — Other Ambulatory Visit: Payer: Self-pay | Admitting: Family Medicine

## 2022-10-27 DIAGNOSIS — F419 Anxiety disorder, unspecified: Secondary | ICD-10-CM

## 2022-10-27 NOTE — Telephone Encounter (Signed)
Is this okay to refill? 

## 2022-11-03 ENCOUNTER — Telehealth: Payer: Self-pay | Admitting: Internal Medicine

## 2022-11-03 NOTE — Telephone Encounter (Signed)
Received partial records from Dr.Medoff's office, waiting for pathology report from colon in 2019. Called patient, is aware and is still interested in proceeding with Transfer of Care. Will call patient once records are received and Dr. Hilarie Fredrickson reviews.

## 2022-12-21 ENCOUNTER — Encounter: Payer: Self-pay | Admitting: Internal Medicine

## 2022-12-21 NOTE — Telephone Encounter (Signed)
Good morning Dr. Hilarie Fredrickson,   We received records from this patient's colonoscopy in 2019. He is a previous patient of Dr. Liliane Channel but has requested a transfer of care with you due to Dr. Earlean Shawl being retired. Patient states you have been highly recommended by his provider and would like to establish ongoing GI care with you. Patient's records are being sent up for your review. Please advise on scheduling.    Thank you.

## 2022-12-21 NOTE — Telephone Encounter (Signed)
Ok for office visit

## 2023-01-08 ENCOUNTER — Telehealth: Payer: Self-pay | Admitting: Family Medicine

## 2023-01-08 NOTE — Telephone Encounter (Signed)
P.A. OZEMPIC 

## 2023-01-09 NOTE — Telephone Encounter (Signed)
Medical records send with P.A.

## 2023-01-15 NOTE — Telephone Encounter (Signed)
P.A. approved til 01/10/24, sent mychart message

## 2023-01-18 DIAGNOSIS — N401 Enlarged prostate with lower urinary tract symptoms: Secondary | ICD-10-CM | POA: Diagnosis not present

## 2023-01-18 DIAGNOSIS — Z8546 Personal history of malignant neoplasm of prostate: Secondary | ICD-10-CM | POA: Diagnosis not present

## 2023-01-18 DIAGNOSIS — R351 Nocturia: Secondary | ICD-10-CM | POA: Diagnosis not present

## 2023-01-18 DIAGNOSIS — N5201 Erectile dysfunction due to arterial insufficiency: Secondary | ICD-10-CM | POA: Diagnosis not present

## 2023-01-21 ENCOUNTER — Other Ambulatory Visit: Payer: Self-pay | Admitting: Family Medicine

## 2023-01-21 DIAGNOSIS — F419 Anxiety disorder, unspecified: Secondary | ICD-10-CM

## 2023-01-23 ENCOUNTER — Encounter: Payer: Self-pay | Admitting: *Deleted

## 2023-01-23 ENCOUNTER — Other Ambulatory Visit: Payer: Self-pay | Admitting: Pharmacist Clinician (PhC)/ Clinical Pharmacy Specialist

## 2023-01-23 DIAGNOSIS — I480 Paroxysmal atrial fibrillation: Secondary | ICD-10-CM

## 2023-01-23 MED ORDER — APIXABAN 5 MG PO TABS: 5.0000 mg | ORAL_TABLET | Freq: Two times a day (BID) | ORAL | 1 refills | Status: DC

## 2023-01-23 NOTE — Telephone Encounter (Signed)
Has upcoming appt soon 

## 2023-01-27 ENCOUNTER — Ambulatory Visit (INDEPENDENT_AMBULATORY_CARE_PROVIDER_SITE_OTHER): Payer: PPO | Admitting: Medical

## 2023-01-27 VITALS — BP 110/64 | HR 79 | Temp 97.9°F

## 2023-01-27 DIAGNOSIS — L989 Disorder of the skin and subcutaneous tissue, unspecified: Secondary | ICD-10-CM

## 2023-01-27 DIAGNOSIS — H1013 Acute atopic conjunctivitis, bilateral: Secondary | ICD-10-CM

## 2023-01-27 MED ORDER — ERYTHROMYCIN 5 MG/GM OP OINT: 1.0000 | TOPICAL_OINTMENT | Freq: Every day | OPHTHALMIC | 0 refills | Status: DC

## 2023-01-27 MED ORDER — CEPHALEXIN 500 MG PO CAPS: 500.0000 mg | ORAL_CAPSULE | Freq: Two times a day (BID) | ORAL | 0 refills | Status: DC

## 2023-01-27 NOTE — Progress Notes (Signed)
Subjective:  Jeremy Tucker is a 72 y.o. male who presents for Chief Complaint  Patient presents with   swollen eye    Started 1-2 days ago, redness, swelling, no blurred visions, slight headache, painful eye, no itchy or gooey     Here for 1 to 2 days of an eye concern.  On the outside of his right eyebrow is a tender swollen skin lesion that just popped up a few days ago.  He also notes puffiness redness of the eyelids and right cheek.  He is on allergy medicine and generally takes Patanol in the spring for allergies but not currently doing Patanol  No fever, no eye pain, no visual change.  No duty or purulent eye drainage.  No watery eyes.   No other aggravating or relieving factors.    No other c/o.  The following portions of the patient's history were reviewed and updated as appropriate: allergies, current medications, past family history, past medical history, past social history, past surgical history and problem list.  ROS Otherwise as in subjective above    Objective: BP 110/64   Pulse 79   Temp 97.9 F (36.6 C)   General appearance: alert, no distress, well developed, well nourished HEENT: normocephalic, sclerae anicteric, conjunctiva with slight injection bilat, eye lids bilat upper and lower slightly pink and mildly swollen, right cheek with some puffy and pink coloration, otherwise PERRLA, EOMI There is a skin lesion of the right lateral orbit/eyebrow that is about 1.5 cm diameter tender and slightly raised with erythema and possible entry hole porthole, but no induration no fluctuance, no drainage.  Patient notes that this is new that there was no prior skin lesion present     Assessment: Encounter Diagnoses  Name Primary?   Allergic conjunctivitis of both eyes Yes   Skin lesion of face      Plan: We discussed the skin findings, findings, possibility of localized skin infection, in addition to conjunctivitis from allergies.  We discussed the following  recommendations  Patient Instructions  You have a couple different things going on today  You have puffiness and pinkish coloration of your eyelids and cheek on the right and some puffiness of the left eyelids as well suggestive of irritation from pollen allergies  You also have this skin lesion that sounds like it is new on the right orbit or temple area just outside the eyebrow region.  This looks like a skin lesion is wanting to get infected  There is no current sign of pinkeye or conjunctivitis    Recommendations: In addition to your allergy pill, begin back on Patanol eyedrops for allergies as you do have redness and puffiness from allergies of your eyelids already Do the Patanol daily in the morning You can do warm moist compresses to your eyes and face in general to help with puffiness and irritation as well as a skin lesion on the right lateral portion of the eyebrow Begin Keflex oral antibiotic twice a day for a week for the new tender skin lesion on the right eyebrow You can also use topical Neosporin or triple antibiotic over-the-counter right on the skin lesion on the right of your face If over the weekend you get pinkeye symptoms such as watery.  Gave the drainage from the eyes, worse redness of the eyeballs or conjunctiva, discomfort or swelling of the eyelids then begin the Romycin erythromycin antibiotic ointment into the eyes But keep in mind currently your eyelid suggest allergies and not pinkeye  or conjunctivitis Over the weekend if you have significant pain or redness surrounding the right orbit of the eye then consider getting reevaluated     [name] was seen today for swollen eye.  Diagnoses and all orders for this visit:  Allergic conjunctivitis of both eyes  Skin lesion of face  Other orders -     cephALEXin (KEFLEX) 500 MG capsule; Take 1 capsule (500 mg total) by mouth 2 (two) times daily. -     erythromycin ophthalmic ointment; Place 1 Application into both  eyes at bedtime.    Follow up: 1-2 wk

## 2023-01-27 NOTE — Patient Instructions (Signed)
You have a couple different things going on today  You have puffiness and pinkish coloration of your eyelids and cheek on the right and some puffiness of the left eyelids as well suggestive of irritation from pollen allergies  You also have this skin lesion that sounds like it is new on the right orbit or temple area just outside the eyebrow region.  This looks like a skin lesion is wanting to get infected  There is no current sign of pinkeye or conjunctivitis    Recommendations: In addition to your allergy pill, begin back on Patanol eyedrops for allergies as you do have redness and puffiness from allergies of your eyelids already Do the Patanol daily in the morning You can do warm moist compresses to your eyes and face in general to help with puffiness and irritation as well as a skin lesion on the right lateral portion of the eyebrow Begin Keflex oral antibiotic twice a day for a week for the new tender skin lesion on the right eyebrow You can also use topical Neosporin or triple antibiotic over-the-counter right on the skin lesion on the right of your face If over the weekend you get pinkeye symptoms such as watery.  Gave the drainage from the eyes, worse redness of the eyeballs or conjunctiva, discomfort or swelling of the eyelids then begin the Romycin erythromycin antibiotic ointment into the eyes But keep in mind currently your eyelid suggest allergies and not pinkeye or conjunctivitis Over the weekend if you have significant pain or redness surrounding the right orbit of the eye then consider getting reevaluated

## 2023-01-30 ENCOUNTER — Other Ambulatory Visit: Payer: Self-pay | Admitting: Medical

## 2023-01-30 ENCOUNTER — Telehealth: Payer: Self-pay | Admitting: Medical

## 2023-01-30 MED ORDER — POLYMYXIN B-TRIMETHOPRIM 10000-0.1 UNIT/ML-% OP SOLN: 1.0000 [drp] | OPHTHALMIC | 0 refills | Status: DC

## 2023-01-30 NOTE — Telephone Encounter (Signed)
Pt was notified of results. Pt is not using cream right now and doing warm compresses and as gotten better. He is going to hold off on the cream at this time

## 2023-01-30 NOTE — Telephone Encounter (Signed)
Pt will call back as he can't talk

## 2023-01-30 NOTE — Telephone Encounter (Signed)
I got a notice from the pharmacy that the E-Mycin ointment was on backorder.  I am sorry I did not see this Friday afternoon.  I did not see it in my folder until this morning  I called out a different eyedrop called Polytrim.  I am hoping that with warm compresses and the oral antibiotic if things are improved in general

## 2023-01-31 ENCOUNTER — Ambulatory Visit (INDEPENDENT_AMBULATORY_CARE_PROVIDER_SITE_OTHER): Payer: PPO | Admitting: Family Medicine

## 2023-01-31 ENCOUNTER — Encounter: Payer: Self-pay | Admitting: Family Medicine

## 2023-01-31 ENCOUNTER — Other Ambulatory Visit: Payer: PPO

## 2023-01-31 VITALS — BP 148/80 | HR 68 | Temp 97.9°F | Wt 231.4 lb

## 2023-01-31 DIAGNOSIS — R051 Acute cough: Secondary | ICD-10-CM | POA: Diagnosis not present

## 2023-01-31 DIAGNOSIS — L989 Disorder of the skin and subcutaneous tissue, unspecified: Secondary | ICD-10-CM

## 2023-01-31 LAB — POC COVID19 BINAXNOW: SARS Coronavirus 2 Ag: NEGATIVE

## 2023-01-31 NOTE — Progress Notes (Signed)
   Subjective:    Patient ID: Jeremy Tucker, male    DOB: [birth year], 72 y.o.   MRN: [id number]  HPI He complains of a 5-day history of started with chest congestion followed by wheezing and coughing but no fever, chills, sore throat or earache.  He presently is on Keflex 500 twice daily for treatment of an abscess to the right upper eyelid.   Review of Systems     Objective:   Physical Exam The lesion on the upper lid is apparently smaller by his estimation than previous.Alert and in no distress. Tympanic membranes and canals are normal. Pharyngeal area is normal. Neck is supple without adenopathy or thyromegaly. Cardiac exam shows a regular sinus rhythm without murmurs or gallops. Lungs are clear to auscultation.  COVID test negative.        Assessment & Plan:   Acute cough - Plan: POC COVID-19  Skin lesion of face I stated at this time continue to use the antibiotic and if there was a bacterial infection in his lungs that should help.  He will call me at the end of the course of the antibiotic to let me know how he is doing.  Otherwise seek and treat his symptoms as needed with OTC meds.  He was comfortable with that.

## 2023-02-01 ENCOUNTER — Ambulatory Visit: Payer: PPO | Admitting: Family Medicine

## 2023-02-02 ENCOUNTER — Telehealth: Payer: Self-pay | Admitting: Family Medicine

## 2023-02-02 DIAGNOSIS — N5201 Erectile dysfunction due to arterial insufficiency: Secondary | ICD-10-CM | POA: Diagnosis not present

## 2023-02-02 MED ORDER — AZITHROMYCIN 500 MG PO TABS: 500.0000 mg | ORAL_TABLET | Freq: Every day | ORAL | 0 refills | Status: DC

## 2023-02-02 NOTE — Telephone Encounter (Signed)
Jeremy Tucker called and states he is still coughing and feeling bad, he is asking if you can send in an antibiotic.

## 2023-02-07 ENCOUNTER — Telehealth: Payer: Self-pay | Admitting: Family Medicine

## 2023-02-07 MED ORDER — AZITHROMYCIN 500 MG PO TABS: 500.0000 mg | ORAL_TABLET | Freq: Every day | ORAL | 0 refills | Status: DC

## 2023-02-07 NOTE — Telephone Encounter (Signed)
Pt called & states he only got a little better on the azithromycin, still congested & coughing up green mucus.  Said in past had 2 take 2 rounds of azithromycin.  He would like another round sent in.  Also he is getting ready to leave to go see grandkids and really needs to get better quickly.

## 2023-02-20 ENCOUNTER — Telehealth: Payer: Self-pay | Admitting: Family Medicine

## 2023-02-20 NOTE — Telephone Encounter (Signed)
Pt states that he is still coughing and has green mucous, he is wheezing and feeling it in his chest. Pt is having to use his inhaler.please advise

## 2023-02-20 NOTE — Telephone Encounter (Signed)
Pt called and states that he is still congested and still coughing. [facility] is [facility] [address] and pt can be reached at [phone].

## 2023-02-21 MED ORDER — AZITHROMYCIN 500 MG PO TABS: 500.0000 mg | ORAL_TABLET | Freq: Every day | ORAL | 0 refills | Status: DC

## 2023-02-21 NOTE — Telephone Encounter (Signed)
Pt was notified.  

## 2023-02-23 ENCOUNTER — Ambulatory Visit: Payer: PPO | Admitting: Family Medicine

## 2023-02-23 DIAGNOSIS — C61 Malignant neoplasm of prostate: Secondary | ICD-10-CM

## 2023-02-23 DIAGNOSIS — E118 Type 2 diabetes mellitus with unspecified complications: Secondary | ICD-10-CM

## 2023-02-23 DIAGNOSIS — Z7901 Long term (current) use of anticoagulants: Secondary | ICD-10-CM

## 2023-02-23 DIAGNOSIS — E1169 Type 2 diabetes mellitus with other specified complication: Secondary | ICD-10-CM

## 2023-02-23 DIAGNOSIS — G4733 Obstructive sleep apnea (adult) (pediatric): Secondary | ICD-10-CM

## 2023-02-23 DIAGNOSIS — E1142 Type 2 diabetes mellitus with diabetic polyneuropathy: Secondary | ICD-10-CM

## 2023-02-23 DIAGNOSIS — I480 Paroxysmal atrial fibrillation: Secondary | ICD-10-CM

## 2023-02-23 DIAGNOSIS — K219 Gastro-esophageal reflux disease without esophagitis: Secondary | ICD-10-CM

## 2023-02-23 DIAGNOSIS — E1159 Type 2 diabetes mellitus with other circulatory complications: Secondary | ICD-10-CM

## 2023-02-28 ENCOUNTER — Encounter: Payer: Self-pay | Admitting: Family Medicine

## 2023-03-07 ENCOUNTER — Ambulatory Visit: Payer: PPO | Admitting: Internal Medicine

## 2023-03-07 ENCOUNTER — Telehealth: Payer: Self-pay

## 2023-03-07 ENCOUNTER — Encounter: Payer: Self-pay | Admitting: Internal Medicine

## 2023-03-07 VITALS — BP 124/68 | Ht 72.0 in | Wt 221.0 lb

## 2023-03-07 DIAGNOSIS — Z86010 Personal history of colonic polyps: Secondary | ICD-10-CM

## 2023-03-07 DIAGNOSIS — Z7901 Long term (current) use of anticoagulants: Secondary | ICD-10-CM | POA: Diagnosis not present

## 2023-03-07 DIAGNOSIS — K3184 Gastroparesis: Secondary | ICD-10-CM | POA: Diagnosis not present

## 2023-03-07 DIAGNOSIS — K219 Gastro-esophageal reflux disease without esophagitis: Secondary | ICD-10-CM | POA: Diagnosis not present

## 2023-03-07 DIAGNOSIS — R110 Nausea: Secondary | ICD-10-CM | POA: Diagnosis not present

## 2023-03-07 DIAGNOSIS — Z79899 Other long term (current) drug therapy: Secondary | ICD-10-CM

## 2023-03-07 NOTE — Telephone Encounter (Signed)
Last labs were > 1 year ago.  Will need CBC, BMET for pharmacy clearance

## 2023-03-07 NOTE — Patient Instructions (Addendum)
_______________________________________________________  If your blood pressure at your visit was 140/90 or greater, please contact your primary care physician to follow up on this. _______________________________________________________  If you are age 73 or older, your body mass index should be between 23-30. Your Body mass index is 29.97 kg/m. If this is out of the aforementioned range listed, please consider follow up with your Primary Care Provider. ________________________________________________________  The [facility] providers would like to encourage you to use [facility] to communicate with providers for non-urgent requests or questions.  Due to long hold times on the telephone, sending your provider a message by [facility] may be a faster and more efficient way to get a response.  Please allow 48 business hours for a response.  Please remember that this is for non-urgent requests.   CONTINUE: omeprazole _______________________________________________________  Jeremy Tucker have been scheduled for an endoscopy and colonoscopy on 06-22-23 at 8:00am.  You will have a previsit appointment on 06-08-23 at 1pm.  Due to recent changes in healthcare laws, you may see the results of your imaging and laboratory studies on MyChart before your provider has had a chance to review them.  We understand that in some cases there may be results that are confusing or concerning to you. Not all laboratory results come back in the same time frame and the provider may be waiting for multiple results in order to interpret others.  Please give [place] 48 hours in order for your provider to thoroughly review all the results before contacting the office for clarification of your results.   Thank you for entrusting me with your care and choosing [facility].  Dr Rhea Belton

## 2023-03-07 NOTE — Telephone Encounter (Signed)
Pt has been scheduled to see Dr. Izora Ribas 05/15/23, clearance will be addressed at that time.  Will route back to the requesting surgeon's office to make them aware.

## 2023-03-07 NOTE — Telephone Encounter (Signed)
Pt will get BMET / CBC 05/11/23

## 2023-03-07 NOTE — Telephone Encounter (Signed)
Primary Cardiologist:Henry Malissa Hippo III, MD (Inactive)  Chart reviewed as part of pre-operative protocol coverage. Because of Jeremy Tucker past medical history and time since last visit, he/she will require a follow-up visit in order to better assess preoperative cardiovascular risk.  Pre-op covering staff: - Please schedule appointment and call patient to inform them (per Dr. Michaelle Copas note from 08/2022 recommendation to f/u in 6 mo with new provider). Since appointment is 4 months away, would recommend that he establish with new provider. - Please contact requesting surgeon's office via preferred method (i.e, phone, fax) to inform them of need for appointment prior to surgery.  This message has also been routed to pharmacy pool for input on holding anticoagulant agent as requested below so that this information is available at time of patient's appointment.   Jeremy Aland, NP-C  03/07/2023, 11:24 AM [address] Office [phone] Fax [phone]

## 2023-03-07 NOTE — Telephone Encounter (Signed)
[facility] Pre-operative Risk Assessment     Request for surgical clearance:     Endoscopy Procedure  What type of surgery is being performed?     Endoscopy, colonoscopy  When is this surgery scheduled?     06-22-23  What type of clearance is required ?   Pharmacy  Are there any medications that need to be held prior to surgery and how long? Eliquis x 2 days  Practice name and name of physician performing surgery?      [facility]  What is your office phone and fax number?      Phone- [phone]  Fax- [phone]  Anesthesia type (None, local, MAC, general) ?       MAC

## 2023-03-07 NOTE — Progress Notes (Signed)
Patient ID: Jeremy Tucker, male   DOB: 1951-08-18, 72 y.o.   MRN: [id number] HPI: Jeremy Tucker is a 72 year old male with a history of GERD, gastroparesis, prior phytobezoar, history of multiple adenomatous colon polyps, CAD, atrial fibrillation on Eliquis, prior aortic stenosis status post valve repair, history of prostate cancer who is seen to establish care for his GERD, gastroparesis and history of colon polyps.  He is here today with his wife.  His previous care was with Dr. Kinnie Tucker and he was briefly seen by digestive health services after Dr. Kinnie Tucker retired.  Last EGD and colonoscopy 08/02/2018 EGD normal esophagus.  Innumerable small gastric polyps felt to be fundic gland.  2 cm hiatal hernia.  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 Colonoscopy same day 6 polyps removed.  Colonic biopsies performed.  Cecal polyp focal active colitis.  Transverse and ascending polyps were adenomatous.  Nodular mucosa in the sigmoid showed focal active colitis.  1 sigmoid polyp hyperplastic.  He reports that he has a history of gastroparesis.  He does deal with early morning nausea on a regular basis.  He has a full feeling that tends to be present most mornings.  He will occasionally vomit.  He does not really feel early satiety and is able to eat without indigestion heartburn dysphagia or dyne aphasia.  He is taking omeprazole 20 mg daily.  His bowel movements are mostly regular and he will take MiraLAX 17 g 1 to 2 days/week if he feels bowel irregularity.  He has not seen blood in his stool or melena.  He did start Ozempic about 6 or 7 months ago for diabetes management.  He is also lost 17 pounds with this.   Past Medical History:  Diagnosis Date   Anxiety    Arthritis    Asthma    Benign fundic gland polyps of stomach    BPH (benign prostatic hyperplasia)    BPH without obstruction/lower urinary tract symptoms    Depression    Diabetic  peripheral neuropathy 01/07/2019   Dyspnea    w/exertion   ED (erectile dysfunction)    Erosive esophagitis    Essential hypertension    Focal active colitis 2019   GERD (gastroesophageal reflux disease)    Hiatal hernia    History of chronic bronchitis    per pt used inhaler as needed   History of DVT of lower extremity 2001   s/p  left TKA completed blood thinner treatment,  per pt no previous clot and none since   History of gastric polyp    per pt benign   History of kidney stones    Hx of adenomatous colonic polyps    Hyperlipidemia    Mild CAD cardiologist--- dr h. Katrinka Blazing   nuclear study 04-14-2017 normal , low risk, nuclear ef 53%;  cardiac cath 09-19-2018  midRCA 30% stenosis otherwise normal coronaries,  heavy AV calicification with decreased mobility with peak grandiant   Mild dilation of ascending aorta    per echo 01-13-2021  38mm   Nephrolithiasis    OA (osteoarthritis)    OSA (obstructive sleep apnea)    per pt refused cpap, intolerant;  moderate osa per study in epic 03-08-2015   PAF (paroxysmal atrial fibrillation)    followed by dr h. 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 and ep ablation 04/ 2016   Peroneal neuropathy at knee, right 12/05/2018   Phytobezoar    Prostate  cancer urologist--- dr dahlstedt/  oncology-- dr Kathrynn Running   first dx 03/ 2021,  Glesaon 3+4  PSA 6.95   Pulmonary nodule 2019   incidently finding on CTA 09-25-2018, stable   Right carpal tunnel syndrome 01/07/2019   Severe aortic stenosis    Type 2 diabetes mellitus    followed by pcp   (03-02-2021  pt stated does not check blood sugar at home)   Umbilical hernia     Past Surgical History:  Procedure Laterality Date   AORTIC VALVE REPLACEMENT N/A 09/20/2021   Procedure: AORTIC VALVE REPLACEMENT (AVR) USING 25 MM INSPIRIS RESILIA  AORTIC VALVE;  Surgeon: Alleen Borne, Jeremy Tucker;  Location: [facility] OR;  Service: Open Heart Surgery;  Laterality: N/A;   ATRIAL FIBRILLATION ABLATION N/A 03/12/2015    Procedure: ATRIAL FIBRILLATION ABLATION;  Surgeon: Hillis Range, Jeremy Tucker;  Location: [facility] CATH LAB;  Service: Cardiovascular;  Laterality: N/A;   CARDIAC CATHETERIZATION  1990's   "Dr. Elsie Lincoln", no blockages per pt   CARDIOVERSION N/A 02/25/2015   Procedure: CARDIOVERSION;  Surgeon: Quintella Reichert, Jeremy Tucker;  Location: [facility] ENDOSCOPY;  Service: Cardiovascular;  Laterality: N/A;   CATARACT EXTRACTION W/ INTRAOCULAR LENS  IMPLANT, BILATERAL  2016   CLIPPING OF ATRIAL APPENDAGE Left 09/20/2021   Procedure: CLIPPING OF ATRIAL APPENDAGE USING ATRICURE 35 MM ATRICLIP;  Surgeon: Alleen Borne, Jeremy Tucker;  Location: [facility] OR;  Service: Open Heart Surgery;  Laterality: Left;   COLONOSCOPY  last one 2013  dr Jeremy Tucker   CYSTOSCOPY N/A 03/08/2021   Procedure: CYSTOSCOPY BLADDER CALCULI EXTRACTION ;  Surgeon: Marcine Matar, Jeremy Tucker;  Location[facility];  Service: Urology;  Laterality: N/A;   CYSTOSCOPY WITH RETROGRADE PYELOGRAM, URETEROSCOPY AND STENT PLACEMENT Left 04/24/2015   Procedure: URETHRAL MEATAL DILATION, CYSTOSCOPY WITH LEFT RETROGRADE PYELOGRAM, LEFT URETEROSCOPY, STONE BASKET EXTRACTION,  LEFT DOUBLE J STENT PLACEMENT;  Surgeon: Jethro Bolus, Jeremy Tucker;  Location: [facility] ORS;  Service: Urology;  Laterality: Left;   EP IMPLANTABLE DEVICE N/A 09/15/2015   Procedure: Loop Recorder Insertion;  Surgeon: Hillis Range, Jeremy Tucker;  Location: [facility] INVASIVE CV LAB;  Service: Cardiovascular;  Laterality: N/A;   ESOPHAGOGASTRODUODENOSCOPY  last one 2017   EXTRACORPOREAL SHOCK WAVE LITHOTRIPSY Bilateral 04/02/2020   Procedure: EXTRACORPOREAL SHOCK WAVE LITHOTRIPSY (ESWL);  Surgeon: Malen Gauze, Jeremy Tucker;  Location: [facility];  Service: Urology;  Laterality: Bilateral;   EXTRACORPOREAL SHOCK WAVE LITHOTRIPSY Left 04/06/2020   Procedure: EXTRACORPOREAL SHOCK WAVE LITHOTRIPSY (ESWL);  Surgeon: Crist Fat, Jeremy Tucker;  Location: [facility];  Service: Urology;  Laterality: Left;   EYE SURGERY      HOLMIUM LASER APPLICATION Left 04/24/2015   Procedure: HOLMIUM LASER APPLICATION;  Surgeon: Jethro Bolus, Jeremy Tucker;  Location: [facility];  Service: Urology;  Laterality: Left;   implantable loopr recorder removal  02/22/2021   MDT LINQ removed in office by Dr Johney Frame   KNEE ARTHROSCOPY Bilateral "multiple times"   LEFT HEART CATH AND CORONARY ANGIOGRAPHY N/A 09/19/2018   Procedure: LEFT HEART CATH AND CORONARY ANGIOGRAPHY;  Surgeon: Lyn Records, Jeremy Tucker;  Location: [facility] INVASIVE CV LAB;  Service: Cardiovascular;  Laterality: N/A;   PERCUTANEOUS NEPHROSTOLITHOTOMY  1980s and 1990s   RADIOACTIVE SEED IMPLANT N/A 03/08/2021   Procedure: RADIOACTIVE SEED IMPLANT/BRACHYTHERAPY IMPLANT;  Surgeon: Marcine Matar, Jeremy Tucker;  Location: [facility];  Service: Urology;  Laterality: N/A;  90 MINS   REPLACEMENT TOTAL KNEE BILATERAL     RIGHT/LEFT HEART CATH AND CORONARY ANGIOGRAPHY N/A 08/04/2021   Procedure: RIGHT/LEFT  HEART CATH AND CORONARY ANGIOGRAPHY;  Surgeon: Lyn Records, Jeremy Tucker;  Location: [facility] INVASIVE CV LAB;  Service: Cardiovascular;  Laterality: N/A;   SINUS ENDO WITH FUSION Bilateral 12/24/2021   Procedure: BILATERAL ENDOSCOPIC SINUS SURGERY WITH INTRANASAL POLYPECTOMY AND FUSION NAVIGATION;  Surgeon: Osborn Coho, Jeremy Tucker;  Location: [facility] OR;  Service: ENT;  Laterality: Bilateral;   SKIN BIOPSY Left 05/06/2020   Shave biopsy left posterior hand neurofiberoma.   SPACE OAR INSTILLATION N/A 03/08/2021   Procedure: SPACE OAR INSTILLATION;  Surgeon: Marcine Matar, Jeremy Tucker;  Location: [facility];  Service: Urology;  Laterality: N/A;   TEE WITHOUT CARDIOVERSION N/A 03/12/2015   Procedure: TRANSESOPHAGEAL ECHOCARDIOGRAM (TEE);  Surgeon: Chrystie Nose, Jeremy Tucker;  Location: [facility] ENDOSCOPY;  Service: Cardiovascular;  Laterality: N/A;   TEE WITHOUT CARDIOVERSION N/A 09/20/2021   Procedure: TRANSESOPHAGEAL ECHOCARDIOGRAM (TEE);  Surgeon: Alleen Borne, Jeremy Tucker;  Location: [facility] OR;  Service: Open Heart  Surgery;  Laterality: N/A;   TONSILLECTOMY  age 39   TOTAL KNEE ARTHROPLASTY Left 11-22-1999     TOTAL KNEE ARTHROPLASTY Right 10/30/2017   Procedure: RIGHT TOTAL KNEE ARTHROPLASTY;  Surgeon: Durene Romans, Jeremy Tucker;  Location: [facility];  Service: Orthopedics;  Laterality: Right;  90 mins   UMBILICAL HERNIA REPAIR N/A 07/05/2021   Procedure: REPAIR INCARCERATED UMBILICAL HERNIA WITH MESH;  Surgeon: Violeta Gelinas, Jeremy Tucker;  Location: [facility] OR;  Service: General;  Laterality: N/A;    Outpatient Medications Prior to Visit  Medication Sig Dispense Refill   acetaminophen (TYLENOL) 500 MG tablet Take 1,000 mg by mouth every 8 (eight) hours as needed for mild pain.      albuterol (VENTOLIN HFA) 108 (90 Base) MCG/ACT inhaler INHALE 2 PUFFS INTO THE LUNGS EVERY 6 HOURS AS NEEDED FOR WHEEZING OR SHORTNESS OF BREATH 54 g 0   allopurinol (ZYLOPRIM) 300 MG tablet TAKE 1 TABLET BY MOUTH DAILY 90 tablet 1   ammonium lactate (LAC-HYDRIN) 12 % lotion Apply 1 application topically daily as needed for dry skin.     apixaban (ELIQUIS) 5 MG TABS tablet Take 1 tablet (5 mg total) by mouth 2 (two) times daily. 180 tablet 1   azithromycin (ZITHROMAX) 500 MG tablet Take 1 tablet (500 mg total) by mouth daily. 3 tablet 0   buPROPion (WELLBUTRIN XL) 300 MG 24 hr tablet TAKE ONE TABLET BY MOUTH DAILY 90 tablet 1   cephALEXin (KEFLEX) 500 MG capsule Take 1 capsule (500 mg total) by mouth 2 (two) times daily. 14 capsule 0   fluticasone (FLONASE) 50 MCG/ACT nasal spray Place 2 sprays into both nostrils daily.     loratadine (CLARITIN) 10 MG tablet Take 10 mg by mouth in the morning.     LORazepam (ATIVAN) 0.5 MG tablet TAKE 1 TABLET TWICE DAILY AS NEEDED FOR ANXIETY. 20 tablet 0   metoprolol succinate (TOPROL XL) 25 MG 24 hr tablet Take 1 tablet (25 mg total) by mouth daily. 90 tablet 3   Multiple Vitamins-Minerals (MULTIVITAMIN WITH MINERALS) tablet Take 1 tablet by mouth daily.     olopatadine (PATANOL) 0.1 % ophthalmic solution  Place 1 drop into both eyes daily as needed for allergies (tearing).      Omega-3 Fatty Acids (FISH OIL) 1000 MG CAPS Take 1,000 mg by mouth daily.     omeprazole (PRILOSEC) 20 MG capsule Take 20 mg by mouth daily.     Semaglutide, 2 MG/DOSE, 8 MG/3ML SOPN Inject 2 mg as directed once a week. 3 mL 5   sertraline (ZOLOFT)  50 MG tablet TAKE 1 TABLET BY MOUTH DAILY 90 tablet 0   sildenafil (VIAGRA) 100 MG tablet TAKE ONE TABLET BY MOUTH DAILY AS NEEDED FOR ERECTILE DYSFUNCTION 20 tablet 1   tamsulosin (FLOMAX) 0.4 MG CAPS capsule Take 0.4 mg by mouth daily. Pt takes 1 tablet daily.     traZODone (DESYREL) 50 MG tablet TAKE 1/2 TO 1 TABLETS BY MOUTH AS NEEDED FOR SLEEP 30 tablet 0   trimethoprim-polymyxin b (POLYTRIM) ophthalmic solution Place 1 drop into both eyes every 4 (four) hours. 10 mL 0   zolpidem (AMBIEN) 5 MG tablet Take 1 tablet (5 mg total) by mouth at bedtime as needed for sleep. 15 tablet 1   No facility-administered medications prior to visit.    Allergies  Allergen Reactions   Oxycodone Itching    Family History  Problem Relation Age of Onset   Heart attack Mother 80       MI   Heart attack Father 20       ? MI versus trauma to head   Healthy Brother    Stroke Maternal Grandmother    Cancer Maternal Grandfather        had prostate removed   Heart attack Maternal Grandfather    Breast cancer Neg Hx    Colon cancer Neg Hx    Pancreatic cancer Neg Hx    Prostate cancer Neg Hx     Social History   Tobacco Use   Smoking status: Never   Smokeless tobacco: Never  Vaping Use   Vaping Use: Never used  Substance Use Topics   Alcohol use: Yes    Comment: very rare   Drug use: Never    ROS: As per history of present illness, otherwise negative  BP 124/68   Ht 6' (1.829 m)   Wt 221 lb (100.2 kg)   BMI 29.97 kg/m  Gen: awake, alert, NAD HEENT: anicteric  CV: Irregularly irregular, no murmur Pulm: CTA b/l Abd: soft, obese, NT/ND, +BS throughout Ext: no  c/c/e Neuro: nonfocal  RELEVANT LABS AND IMAGING: CBC    Component Value Date/Time   WBC 7.9 02/07/2022 1555   WBC 8.0 12/15/2021 1338   RBC 4.60 02/07/2022 1555   RBC 4.37 12/15/2021 1338   HGB 14.3 02/07/2022 1555   HCT 42.3 02/07/2022 1555   PLT 178 02/07/2022 1555   MCV 92 02/07/2022 1555   MCH 31.1 02/07/2022 1555   MCH 30.4 12/15/2021 1338   MCHC 33.8 02/07/2022 1555   MCHC 32.3 12/15/2021 1338   RDW 13.6 02/07/2022 1555   LYMPHSABS 1.1 02/07/2022 1555   MONOABS 781 08/16/2016 0925   EOSABS 0.2 02/07/2022 1555   BASOSABS 0.1 02/07/2022 1555    CMP     Component Value Date/Time   NA 138 02/07/2022 1555   K 4.4 02/07/2022 1555   CL 99 02/07/2022 1555   CO2 24 02/07/2022 1555   GLUCOSE 144 (H) 02/07/2022 1555   GLUCOSE 153 (H) 12/15/2021 1338   BUN 12 02/07/2022 1555   CREATININE 0.88 02/07/2022 1555   CREATININE 0.75 08/16/2016 0925   CALCIUM 9.3 02/07/2022 1555   PROT 6.9 02/07/2022 1555   ALBUMIN 4.7 02/07/2022 1555   AST 40 02/07/2022 1555   ALT 54 (H) 02/07/2022 1555   ALKPHOS 101 02/07/2022 1555   BILITOT 0.3 02/07/2022 1555   GFRNONAA >60 12/15/2021 1338   GFRAA 103 12/10/2019 1110   CLINICAL DATA:  Abdominal pain.  Early satiety.   EXAM: NUCLEAR  MEDICINE GASTRIC EMPTYING SCAN   TECHNIQUE: After oral ingestion of radiolabeled meal, sequential abdominal images were obtained for 4 hours. Percentage of activity emptying the stomach was calculated at 1 hour, 2 hour, 3 hour, and 4 hours.   RADIOPHARMACEUTICALS:  2.0 mCi Tc-63m MDP labeled sulfur colloid orally   COMPARISON:  None.   FINDINGS: Expected location of the stomach in the left upper quadrant. Ingested meal empties the stomach gradually over the course of the study.   Less than 1% emptied at 1 hr ( normal >= 10%)   3.5% emptied at 2 hr ( normal >= 40%)   34.8% emptied at 3 hr ( normal >= 70%)   89.9% emptied at 4 hr ( normal >= 90%)   IMPRESSION: Delayed gastric emptying  study.     Electronically Signed   By: Maisie Fus  Register   On: 03/15/2016 12:35   ASSESSMENT/PLAN: 72 year old male with a history of GERD, gastroparesis, prior phytobezoar, history of multiple adenomatous colon polyps, CAD, atrial fibrillation on Eliquis, prior aortic stenosis status post valve repair, history of prostate cancer who is seen to establish care for his GERD, gastroparesis and history of colon polyps.   GERD/gastroparesis/nausea --gastroparesis as well as Ozempic likely contributing to nausea symptom.  This is not severe.  Heartburn is well-controlled on low-dose omeprazole.  plan as follows: -- Continue omeprazole 20 mg daily -- Upper endoscopy in the [facility] --Ozempic needs to be held at least 7 days prior to upper and lower endoscopy -- See #3  2.  History of adenomatous colon polyps --surveillance colonoscopy recommended at this time.  We reviewed the risk, benefits and alternatives and he is agreeable and wishes to proceed -- 2-day bowel prep for colonoscopy in the [facility] -- See #3  3.  Chronic anticoagulation in the setting of atrial fibrillation -- Will hold Eliquis 2 days prior to endoscopic procedures - will instruct when and how to resume after procedure. Benefits and risks of procedure explained including risks of bleeding, perforation, infection, missed lesions, reactions to medications and possible need for hospitalization and surgery for complications. Additional rare but real risk of stroke or other vascular clotting events off Eliquis also explained and need to seek urgent help if any signs of these problems occur. Will communicate by phone or EMR with patient's  prescribing provider to confirm that holding Eliquis is reasonable in this case.   QM:VHQIONG, Jeremy All, Jeremy Tucker [address] [place],  [place] [address]

## 2023-03-20 DIAGNOSIS — C44722 Squamous cell carcinoma of skin of right lower limb, including hip: Secondary | ICD-10-CM | POA: Diagnosis not present

## 2023-03-20 DIAGNOSIS — D492 Neoplasm of unspecified behavior of bone, soft tissue, and skin: Secondary | ICD-10-CM | POA: Diagnosis not present

## 2023-03-20 DIAGNOSIS — L239 Allergic contact dermatitis, unspecified cause: Secondary | ICD-10-CM | POA: Diagnosis not present

## 2023-03-25 ENCOUNTER — Other Ambulatory Visit: Payer: Self-pay | Admitting: Family Medicine

## 2023-03-25 DIAGNOSIS — E1169 Type 2 diabetes mellitus with other specified complication: Secondary | ICD-10-CM

## 2023-04-10 ENCOUNTER — Other Ambulatory Visit: Payer: Self-pay | Admitting: Family Medicine

## 2023-04-10 DIAGNOSIS — E118 Type 2 diabetes mellitus with unspecified complications: Secondary | ICD-10-CM

## 2023-04-14 ENCOUNTER — Other Ambulatory Visit: Payer: Self-pay

## 2023-04-14 DIAGNOSIS — E785 Hyperlipidemia, unspecified: Secondary | ICD-10-CM

## 2023-04-14 DIAGNOSIS — I480 Paroxysmal atrial fibrillation: Secondary | ICD-10-CM

## 2023-04-14 DIAGNOSIS — E118 Type 2 diabetes mellitus with unspecified complications: Secondary | ICD-10-CM

## 2023-04-14 DIAGNOSIS — N400 Enlarged prostate without lower urinary tract symptoms: Secondary | ICD-10-CM

## 2023-04-21 ENCOUNTER — Telehealth: Payer: Self-pay

## 2023-04-21 NOTE — Progress Notes (Unsigned)
Patient ID: Jeremy Tucker, male   DOB: Jun 20, 1951, 72 y.o.   MRN: [id number] Care Management & Coordination Services Pharmacy Team  Reason for Encounter: Chart Prep for initial visit with Delano Metz on 04/27/23 at 9 am in office.   Have you seen any other providers since your last visit? Patient reports none  Any changes in your medications or health? Patient reports none  Any side effects from any medications? Patient reports he has an occasional dizzy spell.  Do you have an symptoms or problems not managed by your medications? Patient reports no.  Any concerns about your health right now? Patient reports none at this time.  Has your provider asked that you check blood pressure, blood sugar, or follow special diet at home? Patient reports he is checking his blood pressure and has a glucometer but is not sure where it is at the moment will see if he can find to take a few readings to bring with him to the appointment.  Do you get any type of exercise on a regular basis? Patient reports he is walking 1.5 miles 3-4 times a week.  Can you think of a goal you would like to reach for your health? Patient reports none  Do you have any problems getting your medications? Patient reports his Eliquis and Ozempic are most costly for him with the pharmacy.  Is there anything that you would like to discuss during the appointment? Patient reports none  Patient assistance for the following mediations  Patient aware of location of appt and to bring blood pressure cuff glucose reading, medications that do not need refrigeration and supplements to appointment.    Chart review:  Recent office visits:  01/31/23 Ronnald Nian, MD - Patient presented for Acute cough and other concerns. No medication changes.  01/27/23 Tysinger, Kermit Balo, PA-C - Patient presented for Allergic conjunctivitis of both eyes and other concerns. Prescribed Cephalexin. Prescribed Erythromycin. Stopped Advair. Stopped  Rosuvastatin.  Recent consult visits:  03/07/23 Pyrtle, Carie Caddy, MD [facility]ure[facility] Schimke) - Patient presented for GERD and other concerns. No medication changes.  Hospital visits:  None in previous 6 months  Fill History : ALLOPURINOL 300 MG TABLET 01/14/2023 90   ELIQUIS 5 MG TABLET 02/07/2023 90   AZITHROMYCIN 250 MG TABLET 02/21/2023 3   BUPROPION HCL 300 MG TABLET, EXTENDED RELEASE 24 HR 03/25/2023 90   CEPHALEXIN 500 MG CAPSULE 01/27/2023 7   FLUTICASONE PROPIONATE 50 MCG/ACTUATION SPRAY, SUSPENSION 12/24/2022 90   METOPROLOL SUCCINATE 25 MG TABLET, EXTENDED RELEASE 24 HR 02/21/2023 90   NYSTATIN-TRIAMCINOLONE 100,000-0.1 UNIT/GRAM-% OINTMENT (GRAM) 03/20/2023 30   OMEPRAZOLE 20 MG CAPSULE,DELAYED RELEASE (ENTERIC COATED) 01/14/2023 90   POLYMYXIN B SULF-TRIMETHOPRIM 10,000 UNIT- 1 MG/ML DROPS 01/30/2023 16   ROSUVASTATIN 40 MG TABLET 03/28/2023 90   OZEMPIC 2 MG/DOSE (8 MG/3 ML) PEN INJECTOR (ML) 04/11/2023 28   TAMSULOSIN 0.4 MG CAPSULE 02/25/2023 90      Star Rating Drugs:  Rosuvastatin 40 mg - Last filled 03/28/23 90 DS at [facility] Ozempic 2 mg - Last filled 04/11/23 28 DS at [facility]    Care Gaps: Zoster Vaccine - Overdue Eye Exam - Overdue Diabetic Urine - Overdue Foot Exam - Overdue COVID Booster - Overdue A1c - Overdue AWV- 08/26/22   Pamala Duffel CMA Clinical Pharmacist Assistant [phone]

## 2023-04-24 NOTE — Progress Notes (Unsigned)
Care Management & Coordination Services Pharmacy Note  04/24/2023 Name:  Jeremy Tucker MRN:  [id number] DOB:  10-Apr-1951  Summary: ***  Recommendations/Changes made from today's visit: ***  Follow up plan: ***   Subjective: Jeremy Tucker is an 72 y.o. year old male who is a primary patient of Ronnald Nian, MD.  The care coordination team was consulted for assistance with disease management and care coordination needs.    {CCMTELEPHONEFACETOFACE:21091510} for follow up visit.  Recent office visits: 01/31/23 Ronnald Nian, MD - Patient presented for Acute cough and other concerns. No medication changes.   01/27/23 Tysinger, Kermit Balo, PA-C - Patient presented for Allergic conjunctivitis of both eyes and other concerns. Prescribed Cephalexin. Prescribed Erythromycin. Stopped Advair. Stopped Rosuvastatin.  Recent consult visits: 03/07/23 Pyrtle, Carie Caddy, MD Laurette Schimke) - Patient presented for GERD and other concerns. No medication changes.   Hospital visits: {Hospital DC Yes/No:25215}   Objective:  Lab Results  Component Value Date   CREATININE 0.88 02/07/2022   BUN 12 02/07/2022   GFR 107.88 03/09/2015   EGFR 92 02/07/2022   GFRNONAA >60 12/15/2021   GFRAA 103 12/10/2019   NA 138 02/07/2022   K 4.4 02/07/2022   CALCIUM 9.3 02/07/2022   CO2 24 02/07/2022   GLUCOSE 144 (H) 02/07/2022    Lab Results  Component Value Date/Time   HGBA1C 7.3 (A) 10/18/2022 11:46 AM   HGBA1C 7.4 (A) 02/07/2022 03:43 PM   HGBA1C 7.1 (H) 12/15/2021 01:38 PM   HGBA1C 6.8 (H) 09/16/2021 02:39 PM   GFR 107.88 03/09/2015 10:32 AM   MICROALBUR 55.5 08/03/2021 11:47 AM   MICROALBUR 81.8 01/28/2020 11:34 AM    Last diabetic Eye exam: No results found for: "HMDIABEYEEXA"  Last diabetic Foot exam: No results found for: "HMDIABFOOTEX"   Lab Results  Component Value Date   CHOL 166 02/07/2022   HDL 47 02/07/2022   LDLCALC 89 02/07/2022   TRIG 178 (H) 02/07/2022   CHOLHDL 3.5 02/07/2022        Latest Ref Rng & Units 02/07/2022    3:55 PM 09/16/2021    2:39 PM 04/01/2021   10:02 AM  Hepatic Function  Total Protein 6.0 - 8.5 g/dL 6.9  6.8  6.9   Albumin 3.7 - 4.7 g/dL 4.7  4.0  4.3   AST 0 - 40 IU/L 40  34  23   ALT 0 - 44 IU/L 54  38  25   Alk Phosphatase 44 - 121 IU/L 101  73  83   Total Bilirubin 0.0 - 1.2 mg/dL 0.3  0.3  0.4   Bilirubin, Direct 0.00 - 0.40 mg/dL   4.09     Lab Results  Component Value Date/Time   TSH 2.280 07/27/2021 08:39 AM   TSH 1.40 08/16/2016 09:25 AM       Latest Ref Rng & Units 02/07/2022    3:55 PM 12/15/2021    1:38 PM 10/01/2021   12:16 PM  CBC  WBC 3.4 - 10.8 x10E3/uL 7.9  8.0  12.8   Hemoglobin 13.0 - 17.7 g/dL 81.1  91.4  78.2   Hematocrit 37.5 - 51.0 % 42.3  41.2  36.5   Platelets 150 - 450 x10E3/uL 178  161  444     Lab Results  Component Value Date/Time   VITAMINB12 768 12/05/2018 08:08 AM    Clinical ASCVD: Yes  The 10-year ASCVD risk score (Arnett DK, et al., 2019) is: 37.2%   Values used  to calculate the score:     Age: 83 years     Sex: Male     Is Non-Hispanic African American: No     Diabetic: Yes     Tobacco smoker: No     Systolic Blood Pressure: 124 mmHg     Is BP treated: Yes     HDL Cholesterol: 47 mg/dL     Total Cholesterol: 166 mg/dL       46/96/2952   84:13 AM 08/03/2021   10:14 AM 03/04/2021   12:42 PM  Depression screen PHQ 2/9  Decreased Interest 0 0 0  Down, Depressed, Hopeless 0 0 0  PHQ - 2 Score 0 0 0  Altered sleeping 0    Tired, decreased energy 0    Change in appetite 0    Feeling bad or failure about yourself  0    Trouble concentrating 0    Moving slowly or fidgety/restless 0    Suicidal thoughts 0    PHQ-9 Score 0    Difficult doing work/chores Not difficult at all       Social History   Tobacco Use  Smoking Status Never  Smokeless Tobacco Never   BP Readings from Last 3 Encounters:  03/07/23 124/68  01/31/23 (!) 148/80  01/27/23 110/64   Pulse Readings from  Last 3 Encounters:  01/31/23 68  01/27/23 79  10/18/22 (!) 57   Wt Readings from Last 3 Encounters:  03/07/23 221 lb (100.2 kg)  01/31/23 231 lb 6.4 oz (105 kg)  10/18/22 235 lb (106.6 kg)   BMI Readings from Last 3 Encounters:  03/07/23 29.97 kg/m  01/31/23 31.38 kg/m  10/18/22 31.87 kg/m    Allergies  Allergen Reactions   Oxycodone Itching    Medications Reviewed Today     Reviewed by Beverley Fiedler, MD (Physician) on 03/07/23 at 1147  Med List Status: <None>   Medication Order Taking? Sig Documenting Provider Last Dose Status Informant  acetaminophen (TYLENOL) 500 MG tablet [id number] Yes Take 1,000 mg by mouth every 8 (eight) hours as needed for mild pain.  [provider] Taking Active Self  albuterol (VENTOLIN HFA) 108 (90 Base) MCG/ACT inhaler [id number] Yes INHALE 2 PUFFS INTO THE LUNGS EVERY 6 HOURS AS NEEDED FOR WHEEZING OR SHORTNESS OF [name], MD Taking Active Self           Med Note [facility], ROSHENA L   Tue Oct 18, 2022 11:27 AM) Has not used in several months  allopurinol (ZYLOPRIM) 300 MG tablet [id number] Yes TAKE 1 TABLET BY MOUTH DAILY Ronnald Nian, MD Taking Active   ammonium lactate (LAC-HYDRIN) 12 % lotion [id number] Yes Apply 1 application topically daily as needed for dry skin. [provider] Taking Active Self  apixaban (ELIQUIS) 5 MG TABS tablet [id number] Yes Take 1 tablet (5 mg total) by mouth 2 (two) times daily. Pricilla Riffle, MD Taking Active   azithromycin [facility]) 500 MG tablet [id number] Yes Take 1 tablet (500 mg total) by mouth daily. Ronnald Nian, MD Taking Active   buPROPion (WELLBUTRIN XL) 300 MG 24 hr tablet [id number] Yes TAKE ONE TABLET BY MOUTH DAILY Ronnald Nian, MD Taking Active   cephALEXin (KEFLEX) 500 MG capsule [id number] Yes Take 1 capsule (500 mg total) by mouth 2 (two) times daily. Tysinger, Kermit Balo, PA-C Taking Active   fluticasone [facility]) 50 MCG/ACT nasal spray [id number] Yes Place 2  sprays into both nostrils daily. [provider]  Taking Active Self  loratadine (CLARITIN) 10 MG tablet [id number] Yes Take 10 mg by mouth in the morning. [provider] Taking Active Self  LORazepam (ATIVAN) 0.5 MG tablet [id number] Yes TAKE 1 TABLET TWICE DAILY AS NEEDED FOR ANXIETY. Ronnald Nian, MD Taking Active Self  metoprolol succinate (TOPROL XL) 25 MG 24 hr tablet [id number] Yes Take 1 tablet (25 mg total) by mouth daily. Lyn Records, MD Taking Active   Multiple Vitamins-Minerals (MULTIVITAMIN WITH MINERALS) tablet [id number] Yes Take 1 tablet by mouth daily. [provider] Taking Active Self           Med Note Gunnar Fusi, MELISSA R   Mon Dec 13, 2021 12:12 PM)    olopatadine (PATANOL) 0.1 % ophthalmic solution [id number] Yes Place 1 drop into both eyes daily as needed for allergies (tearing).  [provider] Taking Active Self  Omega-3 Fatty Acids (FISH OIL) 1000 MG CAPS [id number] Yes Take 1,000 mg by mouth daily. [provider] Taking Active Self           Med Note Gunnar Fusi, MELISSA R   Mon Dec 13, 2021 12:12 PM)    omeprazole (PRILOSEC) 20 MG capsule [id number] Yes Take 20 mg by mouth daily. [provider] Taking Active Self  Semaglutide, 2 MG/DOSE, 8 MG/3ML SOPN [id number] Yes Inject 2 mg as directed once a week. Ronnald Nian, MD Taking Active   sertraline (ZOLOFT) 50 MG tablet [id number] Yes TAKE 1 TABLET BY MOUTH DAILY Ronnald Nian, MD Taking Active   sildenafil (VIAGRA) 100 MG tablet [id number] Yes TAKE ONE TABLET BY MOUTH DAILY AS NEEDED FOR ERECTILE DYSFUNCTION Ronnald Nian, MD Taking Active   tamsulosin [facility]) 0.4 MG CAPS capsule [id number] Yes Take 0.4 mg by mouth daily. Pt takes 1 tablet daily. [provider] Taking Active   traZODone (DESYREL) 50 MG tablet [id number] Yes TAKE 1/2 TO 1 TABLETS BY MOUTH AS NEEDED FOR SLEEP Ronnald Nian, MD Taking Active Self  trimethoprim-polymyxin b (POLYTRIM) ophthalmic  solution [id number] Yes Place 1 drop into both eyes every 4 (four) hours. Tysinger, Kermit Balo, PA-C Taking Active   zolpidem (AMBIEN) 5 MG tablet [id number] Yes Take 1 tablet (5 mg total) by mouth at bedtime as needed for sleep. Ronnald Nian, MD Taking Active Self           Med Note Deretha Emory, Dorita Sciara   Tue Oct 18, 2022 11:32 AM) Has not taken in over 6-8 months  Med List Note Raynelle Chary, CPhT 12/13/21 1212): Marland Kitchen            SDOH:  (Social Determinants of Health) assessments and interventions performed: Yes SDOH Interventions    Flowsheet Row Clinical Support from 08/26/2022 in [facility] Office Visit from 11/19/2018 in [facility]  SDOH Interventions    Food Insecurity Interventions Intervention Not Indicated --  Transportation Interventions Intervention Not Indicated --  Depression Interventions/Treatment  PHQ2-9 Score <4 Follow-up Not Indicated Medication  Financial Strain Interventions Intervention Not Indicated --  Physical Activity Interventions Patient Refused, Other (Comments) --  Stress Interventions Intervention Not Indicated --       Medication Assistance: {MEDASSISTANCEINFO:25044}  Medication Access: Name and location of current pharmacy:  [id number] - [place], [place] - [address] [place] [address] Phone: [phone] Fax: [phone]  Within the past 30 days, how often has patient missed a dose of medication? *** Is a pillbox or  other method used to improve adherence? {YES/NO:21197} Factors that may affect medication adherence? {CHL DESC; BARRIERS:21522} Are meds synced by current pharmacy? {YES/NO:21197} Are meds delivered by current pharmacy? {YES/NO:21197} Does patient experience delays in picking up medications due to transportation concerns? {YES/NO:21197}  Compliance/Adherence/Medication fill history: Care Gaps: Zoster Vaccine - Overdue Eye Exam - Overdue Diabetic Urine  - Overdue Foot Exam - Overdue COVID Booster - Overdue A1c - Overdue AWV- 08/26/22  Star-Rating Drugs: Rosuvastatin 40 mg - Last filled 03/28/23 90 DS at [facility] Ozempic 2 mg - Last filled 04/11/23 28 DS at [facility]   Assessment/Plan Hypertension (BP goal {CHL HP UPSTREAM Pharmacist BP [phone]}) -{US controlled/uncontrolled:25276} -Current treatment: Metoprolol XL 25mg  1 qd -Medications previously tried: Lisinopril/HCTZ  -Current home readings: *** -Current dietary habits: *** -Current exercise habits: *** -{ACTIONS;DENIES/REPORTS:21021675::"Denies"} hypotensive/hypertensive symptoms -Educated on {CCM BP Counseling:25124} -Counseled to monitor BP at home ***, document, and provide log at future appointments -{CCMPHARMDINTERVENTION:25122}  Hyperlipidemia: (LDL goal < 70) -Uncontrolled -Current treatment: Rosuvastatin 40mg  Appropriate, Query effective -Medications previously tried: Vytorin  -Current dietary patterns: *** -Current exercise habits: *** -Educated on {CCM HLD Counseling:25126} -{CCMPHARMDINTERVENTION:25122}  Diabetes (A1c goal <7%) -Uncontrolled -Current medications: Ozempic 2mg  once weekly -Medications previously tried: Metformin  -Current home glucose readings fasting glucose: *** post prandial glucose: *** -{ACTIONS;DENIES/REPORTS:21021675::"Denies"} hypoglycemic/hyperglycemic symptoms -Current meal patterns:  breakfast: ***  lunch: ***  dinner: *** snacks: *** drinks: *** -Current exercise: *** -Educated on {CCM DM COUNSELING:25123} -Counseled to check feet daily and get yearly eye exams -{CCMPHARMDINTERVENTION:25122}  Atrial Fibrillation (Goal: prevent stroke and major bleeding) -{US controlled/uncontrolled:25276} -CHADSVASC: 5 -Current treatment: Eliquis 5mg  BID Metoprolol XL 25mg  1 qd -Medications previously tried: Amiodarone, Diltiazem, Flecainide -Home BP and HR readings: ***  -Counseled on  {CCMAFIBCOUNSELING:25120} -{CCMPHARMDINTERVENTION:25122}  Query Depression/Anxiety (Goal: Well-controlled mood that still allows for ADLs) -Not assessed today -Current treatment: Bupropion XL 300mg  1 qd Lorazepam 0.5mg  1 BID prn anxiety Sertraline 50mg  1 qd  Asthma/Allergies (Goal: Well-controlled breathing and prevention of exacerbations) -Not assessed today -Current treatment  Albuterol HFA 2 puffs q 6 h prn sob Azithromycin 500mg  1 qd Flonase 2 sprays into both nostrils daily Claritin 10mg  1 qd Patanol 1 drop into both eyes prn  GERD (Goal: minimize symptoms of reflux ) -Not assessed today -Current treatment  Omeprazole 20mg  1 qd  BPH (Goal: improve urination) -Not assessed today -Current treatment: Tamsulosin 0.4mg  1 qd   Insomnia (Goal: Achieve a level of quality sleep that allows for ADLs) -Not assessed today -Current treatment  Zolpidem 5mg  1 qhs prn sleep Trazodone 50mg  1/2 to 1 tab prn sleep at bedtime  Erectile Dysfunction  -Not assessed today -Current treatment  Sildenafil 100mg  prn  Query Gout (Goal: Prevent gout flares) -Not assessed today -Current treatment  Allopurinol 300mg  1 qd  OTC  -Current treatment  MVI 1 qd Acetaminophen 500mg  prn   Sherrill Raring Clinical Pharmacist [phone]

## 2023-04-27 NOTE — Addendum Note (Signed)
Addended by: Burnetta Sabin on: 04/27/2023 04:04 PM   Modules accepted: Orders

## 2023-04-27 NOTE — Addendum Note (Signed)
Addended by: Burnetta Sabin on: 04/27/2023 04:06 PM   Modules accepted: Orders

## 2023-05-06 ENCOUNTER — Other Ambulatory Visit: Payer: Self-pay | Admitting: Family Medicine

## 2023-05-06 DIAGNOSIS — E118 Type 2 diabetes mellitus with unspecified complications: Secondary | ICD-10-CM

## 2023-05-11 ENCOUNTER — Ambulatory Visit: Payer: PPO | Attending: Nurse Practitioner

## 2023-05-11 DIAGNOSIS — Z79899 Other long term (current) drug therapy: Secondary | ICD-10-CM

## 2023-05-12 LAB — BASIC METABOLIC PANEL
BUN/Creatinine Ratio: 16 (ref 10–24)
BUN: 14 mg/dL (ref 8–27)
CO2: 23 mmol/L (ref 20–29)
Calcium: 9.3 mg/dL (ref 8.6–10.2)
Chloride: 103 mmol/L (ref 96–106)
Creatinine, Ser: 0.9 mg/dL (ref 0.76–1.27)
Glucose: 139 mg/dL — ABNORMAL HIGH (ref 70–99)
Potassium: 4.4 mmol/L (ref 3.5–5.2)
Sodium: 141 mmol/L (ref 134–144)
eGFR: 91 mL/min/{1.73_m2} (ref >59)

## 2023-05-12 LAB — CBC
Hematocrit: 43.4 % (ref 37.5–51.0)
Hemoglobin: 14.5 g/dL (ref 13.0–17.7)
MCH: 31.9 pg (ref 26.6–33.0)
MCHC: 33.4 g/dL (ref 31.5–35.7)
MCV: 95 fL (ref 79–97)
Platelets: 182 10*3/uL (ref 150–450)
RBC: 4.55 x10E6/uL (ref 4.14–5.80)
RDW: 12.5 % (ref 11.6–15.4)
WBC: 7.3 10*3/uL (ref 3.4–10.8)

## 2023-05-12 NOTE — Progress Notes (Unsigned)
Cardiology Office Note:  .    Date:  05/15/2023  ID:  Jeremy Tucker, DOB 1951/03/22, MRN [id number] PCP: Ronnald Nian, MD  [facility] Providers Cardiologist:  Christell Constant, MD Electrophysiologist:  Hillis Range, MD (Inactive)     CC: Transition to new cardiologist; Preoperative Eval  History of Present Illness: .    Jeremy Tucker is a 72 y.o. male HTN, HLD with DM, PAC s/p Ablation, OSA not on CPAP, AS s/p SAVR and LA appendage ligation (2022).  Patient notes that he is doing well.   Since last visit notes that he having stomach trouble with nausea.  He has had dry heaving He is . There are no interval hospital/ED visit.   EKG shows sinus rhythm with occasional outflow tract PVC and RBBB.  No chest pain or pressure .  No SOB/DOE and no PND/Orthopnea.  No weight gain or leg swelling.  No palpitations or syncope.  Relevant histories: .  2023: needed amiodarone for return of AF.  Following with Dr. Katrinka Blazing. 2024: Stable labs.  Established care with me. 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   Studies Reviewed: .   Cardiac Studies & Procedures   CARDIAC CATHETERIZATION  CARDIAC CATHETERIZATION 08/04/2021  Narrative CONCLUSIONS: Normal left main 40% proximal RCA stenosis.  Right dominant anatomy. Widely patent circumflex. 20 to 30% mid LAD.  The LAD reaches the left ventricular apex. Normal pulmonary artery pressures, mean 23 mmHg. Pulmonary capillary wedge pressure mean, 13 mmHg.  RECOMMENDATIONS: Referred to the aortic valve clinic for consideration of TAVR versus SAVR  Findings Coronary Findings Diagnostic  Dominance: Right  Left Anterior Descending Prox LAD to Mid LAD lesion is 25% stenosed.  Right Coronary Artery Prox RCA lesion is 40% stenosed.  Intervention  No interventions have been documented.   CARDIAC CATHETERIZATION  CARDIAC CATHETERIZATION 09/19/2018  Narrative  Right dominant coronary anatomy.   Normal left main.  Normal LAD.  Normal circumflex giving origin to 4 obtuse marginals with the second marginal being a large vessel supplying much of the lateral wall.  RCA contains eccentric proximal 30% narrowing.  Otherwise widely patent without any significant obstructive disease.  Heavily calcified aortic valve with restricted motion.  Transvalvular peak to peak gradient 33 mmHg; mean gradient 30 mmHg.  Normal left ventricular LVEDP, 15 mmHg.  Atypical episodes of chest pain occurring at rest lasting up to 30 to 40 minutes, characterized as severe and in the left pectoral region.  Suspect neurogenic/musculoskeletal.  Rule out aortic related.  RECOMMENDATIONS:   CT aortic angiography to rule out aneurysm/dissection.  We will set up to be done as an outpatient.  Continue aerobic activity.  Resume apixaban a.m. 09/20/2018.  No indication for antiplatelet therapy at this time.  Findings Coronary Findings Diagnostic  Dominance: Right  Right Coronary Artery Prox RCA lesion is 30% stenosed.  Intervention  No interventions have been documented.   STRESS TESTS  MYOCARDIAL PERFUSION IMAGING 04/26/2017  Narrative  Nuclear stress EF: 53%.  Blood pressure demonstrated a normal response to exercise.  There was no ST segment deviation noted during stress.  No T wave inversion was noted during stress.  The left ventricular ejection fraction is mildly decreased (45-54%).  The study is normal.  This is a low risk study.   ECHOCARDIOGRAM  ECHOCARDIOGRAM COMPLETE 11/02/2021  Narrative ECHOCARDIOGRAM REPORT    Patient Name:   Jeremy Tucker Date of Exam: 11/02/2021 Medical Rec #:  [id number]  Height:       72.0 in Accession #:    [id number]    Weight:       224.8 lb Date of Birth:  1951/05/23     BSA:          2.239 m Patient Age:    70 years      BP:           110/70 mmHg Patient Gender: M             HR:           58 bpm. Exam Location:  [address]  Procedure: 2D Echo, Cardiac Doppler and Color Doppler  Indications:    I35.0 AS  History:        Patient has prior history of Echocardiogram examinations, most recent 07/20/2021. S/p AVR (25mm Inspiris Resilia), Arrythmias:Paroxysmal atrial fibrillation, Signs/Symptoms:Dilated aortic root; Risk Factors:Hypertension, Diabetes and Dyslipidemia. Aortic Valve: 25 mm Inspiris Resilia valve is present in the aortic position.  Sonographer:    Samule Ohm RDCS Referring Phys: [phone] Barry Dienes [facility]  IMPRESSIONS   1. Left ventricular ejection fraction, by estimation, is 60 to 65%. The left ventricle has normal function. The left ventricle has no regional wall motion abnormalities. There is mild asymmetric left ventricular hypertrophy of the basal and septal segments. Left ventricular diastolic parameters are indeterminate. 2. Right ventricular systolic function is normal. The right ventricular size is normal. There is normal pulmonary artery systolic pressure. The estimated right ventricular systolic pressure is 34.6 mmHg. 3. The mitral valve is normal in structure. Mild mitral valve regurgitation. No evidence of mitral stenosis. 4. The aortic valve has been repaired/replaced. Aortic valve regurgitation is not visualized. No aortic stenosis is present. There is a 25 mm Inspiris Resilia valve present in the aortic position. Echo findings are consistent with normal structure and function of the aortic valve prosthesis. Aortic valve area, by VTI measures 2.10 cm. Aortic valve mean gradient measures 7.6 mmHg. Aortic valve Vmax measures 1.99 m/s. 5. The inferior vena cava is normal in size with greater than 50% respiratory variability, suggesting right atrial pressure of 3 mmHg.  FINDINGS Left Ventricle: Left ventricular ejection fraction, by estimation, is 60 to 65%. The left ventricle has normal function. The left ventricle has no regional wall motion abnormalities. The left ventricular  internal cavity size was normal in size. There is mild asymmetric left ventricular hypertrophy of the basal and septal segments. Left ventricular diastolic parameters are indeterminate.  Right Ventricle: The right ventricular size is normal. No increase in right ventricular wall thickness. Right ventricular systolic function is normal. There is normal pulmonary artery systolic pressure. The tricuspid regurgitant velocity is 2.81 m/s, and with an assumed right atrial pressure of 3 mmHg, the estimated right ventricular systolic pressure is 34.6 mmHg.  Left Atrium: Left atrial size was normal in size.  Right Atrium: Right atrial size was normal in size.  Pericardium: There is no evidence of pericardial effusion.  Mitral Valve: The mitral valve is normal in structure. There is mild thickening of the mitral valve leaflet(s). Mild mitral valve regurgitation. No evidence of mitral valve stenosis.  Tricuspid Valve: The tricuspid valve is normal in structure. Tricuspid valve regurgitation is mild . No evidence of tricuspid stenosis.  Aortic Valve: The aortic valve has been repaired/replaced. Aortic valve regurgitation is not visualized. No aortic stenosis is present. Aortic valve mean gradient measures 7.6 mmHg. Aortic valve peak gradient measures 15.9 mmHg. Aortic valve area, by VTI  measures 2.10 cm. There is a 25 mm Inspiris Resilia valve present in the aortic position. Echo findings are consistent with normal structure and function of the aortic valve prosthesis.  Pulmonic Valve: The pulmonic valve was normal in structure. Pulmonic valve regurgitation is not visualized. No evidence of pulmonic stenosis.  Aorta: The aortic root is normal in size and structure.  Venous: The inferior vena cava is normal in size with greater than 50% respiratory variability, suggesting right atrial pressure of 3 mmHg.  IAS/Shunts: No atrial level shunt detected by color flow Doppler.   LEFT VENTRICLE PLAX  2D LVIDd:         4.50 cm   Diastology LVIDs:         3.10 cm   LV e' medial:    5.22 cm/s LV PW:         1.10 cm   LV E/e' medial:  27.8 LV IVS:        1.40 cm   LV e' lateral:   10.73 cm/s LVOT diam:     2.30 cm   LV E/e' lateral: 13.5 LV SV:         87 LV SV Index:   39 LVOT Area:     4.15 cm   RIGHT VENTRICLE            IVC RVSP:           34.6 mmHg  IVC diam: 1.70 cm  LEFT ATRIUM             Index        RIGHT ATRIUM           Index LA diam:        5.20 cm 2.32 cm/m   RA Pressure: 3.00 mmHg LA Vol (A2C):   91.0 ml 40.64 ml/m  RA Area:     21.50 cm LA Vol (A4C):   65.2 ml 29.12 ml/m  RA Volume:   69.60 ml  31.08 ml/m LA Biplane Vol: 77.3 ml 34.52 ml/m AORTIC VALVE AV Area (Vmax):    1.99 cm AV Area (Vmean):   2.06 cm AV Area (VTI):     2.10 cm AV Vmax:           199.20 cm/s AV Vmean:          127.000 cm/s AV VTI:            0.412 m AV Peak Grad:      15.9 mmHg AV Mean Grad:      7.6 mmHg LVOT Vmax:         95.24 cm/s LVOT Vmean:        63.040 cm/s LVOT VTI:          0.209 m LVOT/AV VTI ratio: 0.51  AORTA Ao Root diam: 3.30 cm Ao Asc diam:  3.80 cm  MITRAL VALVE                TRICUSPID VALVE MV Area (PHT): 3.58 cm     TR Peak grad:   31.6 mmHg MV Decel Time: 212 msec     TR Vmax:        281.00 cm/s MV E velocity: 145.20 cm/s  Estimated RAP:  3.00 mmHg MV A velocity: 58.54 cm/s   RVSP:           34.6 mmHg MV E/A ratio:  2.48 SHUNTS Systemic VTI:  0.21 m Systemic Diam: 2.30 cm  Donato Schultz MD Electronically signed by Loraine Leriche  Skains MD Signature Date/Time: 11/02/2021/12:09:27 PM    Final   TEE  ECHO INTRAOPERATIVE TEE 10/11/2021  Narrative *INTRAOPERATIVE TRANSESOPHAGEAL REPORT *    Patient Name:   Jeremy Tucker  Date of Exam: 09/20/2021 Medical Rec #:  [id number]      Height:       72.0 in Accession #:    [id number]     Weight:       231.0 lb Date of Birth:  03-19-1951      BSA:          2.27 m Patient Age:    70 years       BP:            163/76 mmHg Patient Gender: M              HR:           65 bpm. Exam Location:  Anesthesiology  Transesophogeal exam was perform intraoperatively during surgical procedure. Patient was closely monitored under general anesthesia during the entirety of examination.  Indications:     Aortic valve disorder Sonographer:     Lavenia Atlas RDCS Performing Phys: [id number] Alleen Borne Diagnosing Phys: Val Eagle MD  Complications: No known complications during this procedure. POST-OP IMPRESSIONS _ Left Ventricle: has normal systolic function, with an ejection fraction of 65%. The cavity size was normal. The wall motion is normal. _ Right Ventricle: normal function. The wall motion is normal. _ Aorta: there is no dissection present in the aorta. _ Aortic Valve: No stenosis present. A bovine bioprosthetic valve was placed, leaflets are freely mobile and leaflets thin. There is no regurgitation. No regurgitation post repair. The gradient recorded across the prosthetic valve is within the expected range. No perivalvular leak noted. _ Tricuspid Valve: There is mild regurgitation.  PRE-OP FINDINGS Left Ventricle: The left ventricle has normal systolic function, with an ejection fraction of 60-65%. The cavity size was normal. No evidence of left ventricular regional wall motion abnormalities. There is severe concentric left ventricular hypertrophy.   Right Ventricle: The right ventricle has normal systolic function. The cavity was normal. There is right vetricular wall thickness was not assessed.  Left Atrium: No left atrial/left atrial appendage thrombus was detected.  Interatrial Septum: No atrial level shunt detected by color flow Doppler. There is no evidence of a patent foramen ovale.  Pericardium: There is no evidence of pericardial effusion.  Mitral Valve: The mitral valve is normal in structure. Mitral valve regurgitation is mild by color flow Doppler. There is No evidence of  mitral stenosis.  Tricuspid Valve: The tricuspid valve was normal in structure. Tricuspid valve regurgitation is trivial by color flow Doppler. No evidence of tricuspid stenosis is present.  Aortic Valve: The aortic valve is tricuspid Aortic valve regurgitation is mild by color flow Doppler. The jet is centrally-directed. There is severe stenosis of the aortic valve, with a calculated valve area of 0.95 cm. There is no evidence of aortic valve vegetation. There is severe thickening and severe calcifcation present on the aortic valve right coronary and non-coronary cusps with moderately decreased mobility and there is mild thickening and mild calcification present on the aortic valve left coronary cusp with mildly decreased mobility.  Pulmonic Valve: The pulmonic valve was not assessed. Pulmonic valve regurgitation was not assessed by color flow Doppler.   Aorta: There is evidence of a dissection in the none.  +--------------+--------++ LEFT VENTRICLE         +--------------+--------++ PLAX 2D                +--------------+--------++  LVOT diam:    2.20 cm  +--------------+--------++ LVOT Area:    3.80 cm +--------------+--------++                        +--------------+--------++  +------------------+------------++ AORTIC VALVE                   +------------------+------------++ AV Area (Vmax):   1.38 cm     +------------------+------------++ AV Area (Vmean):  1.24 cm     +------------------+------------++ AV Area (VTI):    0.95 cm     +------------------+------------++ AV Vmax:          322.00 cm/s  +------------------+------------++ AV Vmean:         230.500 cm/s +------------------+------------++ AV VTI:           0.804 m      +------------------+------------++ AV Peak Grad:     41.5 mmHg    +------------------+------------++ AV Mean Grad:     49.0 mmHg    +------------------+------------++ LVOT Vmax:         117.00 cm/s  +------------------+------------++ LVOT Vmean:       75.300 cm/s  +------------------+------------++ LVOT VTI:         0.201 m      +------------------+------------++ LVOT/AV VTI ratio:0.25         +------------------+------------++   +--------------+-------+ SHUNTS                +--------------+-------+ Systemic VTI: 0.20 m  +--------------+-------+ Systemic Diam:2.20 cm +--------------+-------+   Val Eagle MD Electronically signed by Val Eagle MD Signature Date/Time: 10/11/2021/12:25:18 PM    Final    CT SCANS  CT CORONARY MORPH W/CTA COR W/SCORE 08/25/2021  Addendum 08/25/2021  4:41 PM ADDENDUM REPORT: 08/25/2021 16:38  ADDENDUM: Please see separate dictation for contemporaneously obtained CTA chest, abdomen and pelvis dated 08/25/2021 for full description of relevant extracardiac findings.   Electronically Signed By: Trudie Reed M.D. On: 08/25/2021 16:38  Narrative CLINICAL DATA:  Aortic Stenosis  EXAM: Cardiac TAVR CT  TECHNIQUE: The patient was scanned on a Siemens Force 192 slice scanner. A 120 kV retrospective scan was triggered in the ascending thoracic aorta at 140 HU's. Gantry rotation speed was 250 msecs and collimation was .6 mm. No beta blockade or nitro were given. The 3D data set was reconstructed in 5% intervals of the R-R cycle. Systolic and diastolic phases were analyzed on a dedicated work station using MPR, MIP and VRT modes. The patient received 80 cc of contrast.  FINDINGS: Aortic Valve: Tri leaflet AV heavily calcified with score 5130  Aorta: No aneurysm Bovine Arch mild calcific atherosclerosis  Sino-tubular Junction: 28 mm  Ascending Thoracic Aorta: 35 mm  Aortic Arch: 29 mm  Descending Thoracic Aorta: 26 mm  Sinus of Valsalva Measurements:  Non-coronary: 33.7 mm  Right - coronary: 30.4 mm sinus height 17.7 mm  Left -   coronary: 30.7 mm sinus height  21.2 mm  Coronary Artery Height above Annulus:  Left Main: 13.2 mm above annulus  Right Coronary: 14.1 mm above annulus  Virtual Basal Annulus Measurements:  Maximum / Minimum Diameter: 32.1 mm x 25.9 mm  Perimeter: 94.1 mm  Area: 669 mm2  Coronary Arteries: Sufficient height above annulus for deployment  Optimum Fluoroscopic Angle for Delivery: LAO 14 Caudal 14 degrees  IMPRESSION: 1. Tri-leaflet AV with calcium score 5130  2. Optimum angiographic angle for deployment LAO 14 Caudal 14 degrees  3.  Annular area of 669 mm2 upper  limit of 29 mm Sapien 3 valve  4. Concern for marked annular and subvalvular calcium extending across inter-valvular fibrosa to mid anterior mitral leaflet  5.  Coronary arteries sufficient height above annulus for deployment  Charlton Haws  Electronically Signed: By: Charlton Haws M.D. On: 08/25/2021 15:21           Physical Exam:    VS:  BP 130/78   Pulse 71   Ht 6' (1.829 m)   Wt 228 lb (103.4 kg)   SpO2 94%   BMI 30.92 kg/m    Wt Readings from Last 3 Encounters:  05/15/23 228 lb (103.4 kg)  03/07/23 221 lb (100.2 kg)  01/31/23 231 lb 6.4 oz (105 kg)    Gen: No distress   Neck: No JVD, Frank's sign Cardiac: No Rubs or Gallops, systolic murmur, +2 radial pulses Respiratory: Clear to auscultation bilaterally, normal effort, normal  respiratory rate GI: Soft, nontender, non-distended  MS: No  edema;  moves all extremities Integument: Skin feels warm Neuro:  At time of evaluation, alert and oriented to person Psych: Normal affect, patient feels well   ASSESSMENT AND PLAN: .    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 [place], [address] [place], [place] [address] [phone]  8:44 AM

## 2023-05-15 ENCOUNTER — Encounter: Payer: Self-pay | Admitting: Internal Medicine

## 2023-05-15 ENCOUNTER — Ambulatory Visit: Payer: PPO | Attending: Internal Medicine

## 2023-05-15 ENCOUNTER — Ambulatory Visit: Payer: PPO | Attending: Internal Medicine | Admitting: Internal Medicine

## 2023-05-15 VITALS — BP 130/78 | HR 71 | Ht 72.0 in | Wt 228.0 lb

## 2023-05-15 DIAGNOSIS — Z7901 Long term (current) use of anticoagulants: Secondary | ICD-10-CM | POA: Diagnosis not present

## 2023-05-15 DIAGNOSIS — I493 Ventricular premature depolarization: Secondary | ICD-10-CM | POA: Diagnosis not present

## 2023-05-15 DIAGNOSIS — E782 Mixed hyperlipidemia: Secondary | ICD-10-CM

## 2023-05-15 DIAGNOSIS — G4733 Obstructive sleep apnea (adult) (pediatric): Secondary | ICD-10-CM | POA: Diagnosis not present

## 2023-05-15 DIAGNOSIS — Z01810 Encounter for preprocedural cardiovascular examination: Secondary | ICD-10-CM | POA: Diagnosis not present

## 2023-05-15 DIAGNOSIS — I491 Atrial premature depolarization: Secondary | ICD-10-CM

## 2023-05-15 DIAGNOSIS — Z7985 Long-term (current) use of injectable non-insulin antidiabetic drugs: Secondary | ICD-10-CM

## 2023-05-15 DIAGNOSIS — Z952 Presence of prosthetic heart valve: Secondary | ICD-10-CM

## 2023-05-15 DIAGNOSIS — I10 Essential (primary) hypertension: Secondary | ICD-10-CM

## 2023-05-15 DIAGNOSIS — E669 Obesity, unspecified: Secondary | ICD-10-CM | POA: Diagnosis not present

## 2023-05-15 DIAGNOSIS — I4510 Unspecified right bundle-branch block: Secondary | ICD-10-CM | POA: Diagnosis not present

## 2023-05-15 DIAGNOSIS — I480 Paroxysmal atrial fibrillation: Secondary | ICD-10-CM | POA: Diagnosis not present

## 2023-05-15 DIAGNOSIS — E1169 Type 2 diabetes mellitus with other specified complication: Secondary | ICD-10-CM

## 2023-05-15 DIAGNOSIS — E785 Hyperlipidemia, unspecified: Secondary | ICD-10-CM

## 2023-05-15 NOTE — Progress Notes (Unsigned)
Enrolled for [employer] to mail a ZIO XT long term holter monitor to the patients address on file.  

## 2023-05-15 NOTE — Patient Instructions (Signed)
Medication Instructions:  Your physician recommends that you continue on your current medications as directed. Please refer to the Current Medication list given to you today.  *If you need a refill on your cardiac medications before your next appointment, please call your pharmacy*   Lab Work: NONE  If you have labs (blood work) drawn today and your tests are completely normal, you will receive your results only by: MyChart Message (if you have MyChart) OR A paper copy in the mail If you have any lab test that is abnormal or we need to change your treatment, we will call you to review the results.   Testing/Procedures: Your physician has requested that you have an echocardiogram. Echocardiography is a painless test that uses sound waves to create images of your heart. It provides your doctor with information about the size and shape of your heart and how well your heart's chambers and valves are working. This procedure takes approximately one hour. There are no restrictions for this procedure. Please do NOT wear cologne, perfume, aftershave, or lotions (deodorant is allowed). Please arrive 15 minutes prior to your appointment time.  Your physician has requested that you wear a heart monitor.    Follow-Up: At [facility], you and your health needs are our priority.  As part of our continuing mission to provide you with exceptional heart care, we have created designated Provider Care Teams.  These Care Teams include your primary Cardiologist (physician) and Advanced Practice Providers (APPs -  Physician Assistants and Nurse Practitioners) who all work together to provide you with the care you need, when you need it.   Your next appointment:   1 year(s)  Provider:   Christell Constant, MD     Other Instructions Christena Deem- Long Term Monitor Instructions  Your physician has requested you wear a ZIO patch monitor for 7 days.  This is a single patch monitor. [employer] supplies  one patch monitor per enrollment. Additional stickers are not available. Please do not apply patch if you will be having a Nuclear Stress Test,   Cardiac CT, MRI, or Chest Xray during the period you would be wearing the  monitor. The patch cannot be worn during these tests. You cannot remove and re-apply the  ZIO XT patch monitor.  Your ZIO patch monitor will be mailed 3 day USPS to your address on file. It may take 3-5 days  to receive your monitor after you have been enrolled.  Once you have received your monitor, please review the enclosed instructions. Your monitor  has already been registered assigning a specific monitor serial # to you.  Billing and Patient Assistance Program Information  We have supplied [employer] with any of your insurance information on file for billing purposes. [employer] offers a sliding scale Patient Assistance Program for patients that do not have  insurance, or whose insurance does not completely cover the cost of the ZIO monitor.  You must apply for the Patient Assistance Program to qualify for this discounted rate.  To apply, please call [employer] at [phone], select option 4, select option 2, ask to apply for  Patient Assistance Program. Meredeth Ide will ask your household income, and how many people  are in your household. They will quote your out-of-pocket cost based on that information.  [employer] will also be able to set up a 53-month, interest-free payment plan if needed.  Applying the monitor   Shave hair from upper left chest.  Hold abrader disc by orange tab. Rub  abrader in 40 strokes over the upper left chest as  indicated in your monitor instructions.  Clean area with 4 enclosed alcohol pads. Let dry.  Apply patch as indicated in monitor instructions. Patch will be placed under collarbone on left  side of chest with arrow pointing upward.  Rub patch adhesive wings for 2 minutes. Remove white label marked "1". Remove the white  label marked "2". Rub  patch adhesive wings for 2 additional minutes.  While looking in a mirror, press and release button in center of patch. A small green light will  flash 3-4 times. This will be your only indicator that the monitor has been turned on.  Do not shower for the first 24 hours. You may shower after the first 24 hours.  Press the button if you feel a symptom. You will hear a small click. Record Date, Time and  Symptom in the Patient Logbook.  When you are ready to remove the patch, follow instructions on the last 2 pages of Patient  Logbook. Stick patch monitor onto the last page of Patient Logbook.  Place Patient Logbook in the blue and white box. Use locking tab on box and tape box closed  securely. The blue and white box has prepaid postage on it. Please place it in the mailbox as  soon as possible. Your physician should have your test results approximately 7 days after the  monitor has been mailed back to [facility].  Call [facility] Customer Care at [phone] if you have questions regarding  your ZIO XT patch monitor. Call them immediately if you see an orange light blinking on your  monitor.  If your monitor falls off in less than 4 days, contact our Monitor department at [phone].  If your monitor becomes loose or falls off after 4 days call [employer] at [phone] for  suggestions on securing your monitor

## 2023-05-16 ENCOUNTER — Telehealth: Payer: Self-pay

## 2023-05-16 NOTE — Telephone Encounter (Signed)
Dr. Sharlot Gowda    Request for surgical clearance:     Endoscopy Procedure  What type of surgery is being performed?     Endo/colon  When is this surgery scheduled?     06/22/23  What type of clearance is required ?   Pharmacy  Are there any medications that need to be held prior to surgery and how long? Eliquis 2 days  Practice name and name of physician performing surgery?      [facility]  What is your office phone and fax number?      Phone- [phone]  Fax- [phone]  Anesthesia type (None, local, MAC, general) ?       MAC

## 2023-05-16 NOTE — Telephone Encounter (Signed)
Jeremy Tucker, you scheduled this patient for a pre visit at 1:00AM in room 53, which is closed for that day.  Please reschedule this patient and call him to give updated appointment information.

## 2023-05-16 NOTE — Telephone Encounter (Signed)
Anti coag request sent to pts PCP to hold eliquis for 2 days prior to [facility].

## 2023-05-18 DIAGNOSIS — I493 Ventricular premature depolarization: Secondary | ICD-10-CM

## 2023-05-23 ENCOUNTER — Telehealth: Payer: Self-pay

## 2023-05-23 NOTE — Telephone Encounter (Signed)
No response through epic. Letter faxed to PCP office requesting clearance. Faxed to [phone].

## 2023-05-23 NOTE — Telephone Encounter (Signed)
-----   Message from Ronnald Nian, MD sent at 05/23/2023 12:50 PM EDT ----- yes ----- Message ----- From: Chrystie Nose, RN Sent: 05/23/2023  10:44 AM EDT To: Ronnald Nian, MD  Requesting clearance to hold Eliquis for 2 days prior to procedure.

## 2023-05-23 NOTE — Telephone Encounter (Signed)
Pt ok to hold eliquis for 2 days, see note below.

## 2023-05-24 ENCOUNTER — Other Ambulatory Visit: Payer: Self-pay

## 2023-05-24 MED ORDER — METOPROLOL SUCCINATE ER 25 MG PO TB24: 25.0000 mg | ORAL_TABLET | Freq: Every day | ORAL | 3 refills | Status: DC

## 2023-05-25 NOTE — Telephone Encounter (Signed)
Appointment changed to 06/08/23 @ 12:30 in room 50 in pre visit.

## 2023-05-27 DIAGNOSIS — I493 Ventricular premature depolarization: Secondary | ICD-10-CM | POA: Diagnosis not present

## 2023-06-08 ENCOUNTER — Ambulatory Visit (AMBULATORY_SURGERY_CENTER): Payer: PPO

## 2023-06-08 VITALS — Ht 72.0 in | Wt 225.0 lb

## 2023-06-08 DIAGNOSIS — K2100 Gastro-esophageal reflux disease with esophagitis, without bleeding: Secondary | ICD-10-CM

## 2023-06-08 DIAGNOSIS — Z86010 Personal history of colonic polyps: Secondary | ICD-10-CM

## 2023-06-08 MED ORDER — PEG 3350-KCL-NA BICARB-NACL 420 G PO SOLR
4000.0000 mL | Freq: Once | ORAL | 0 refills | Status: AC
Start: 2023-06-08 — End: 2023-06-08

## 2023-06-12 ENCOUNTER — Encounter: Payer: Self-pay | Admitting: Internal Medicine

## 2023-06-13 ENCOUNTER — Ambulatory Visit (HOSPITAL_COMMUNITY): Payer: PPO | Attending: Internal Medicine

## 2023-06-13 DIAGNOSIS — I493 Ventricular premature depolarization: Secondary | ICD-10-CM

## 2023-06-13 DIAGNOSIS — I119 Hypertensive heart disease without heart failure: Secondary | ICD-10-CM | POA: Diagnosis not present

## 2023-06-13 DIAGNOSIS — Z9889 Other specified postprocedural states: Secondary | ICD-10-CM | POA: Insufficient documentation

## 2023-06-13 DIAGNOSIS — I340 Nonrheumatic mitral (valve) insufficiency: Secondary | ICD-10-CM | POA: Diagnosis not present

## 2023-06-13 DIAGNOSIS — E669 Obesity, unspecified: Secondary | ICD-10-CM | POA: Diagnosis not present

## 2023-06-13 DIAGNOSIS — I4891 Unspecified atrial fibrillation: Secondary | ICD-10-CM | POA: Diagnosis not present

## 2023-06-13 DIAGNOSIS — E785 Hyperlipidemia, unspecified: Secondary | ICD-10-CM | POA: Insufficient documentation

## 2023-06-13 DIAGNOSIS — E119 Type 2 diabetes mellitus without complications: Secondary | ICD-10-CM | POA: Insufficient documentation

## 2023-06-13 DIAGNOSIS — I4510 Unspecified right bundle-branch block: Secondary | ICD-10-CM | POA: Insufficient documentation

## 2023-06-13 LAB — ECHOCARDIOGRAM COMPLETE
AR max vel: 1.29 cm2
AV Area VTI: 1.38 cm2
AV Area mean vel: 1.31 cm2
AV Mean grad: 11 mmHg
AV Peak grad: 21.1 mmHg
Ao pk vel: 2.3 m/s
Area-P 1/2: 3.46 cm2
S' Lateral: 3.2 cm

## 2023-06-22 ENCOUNTER — Other Ambulatory Visit: Payer: Self-pay | Admitting: Family Medicine

## 2023-06-22 ENCOUNTER — Encounter: Payer: Self-pay | Admitting: Internal Medicine

## 2023-06-22 ENCOUNTER — Ambulatory Visit (AMBULATORY_SURGERY_CENTER): Payer: PPO | Admitting: Internal Medicine

## 2023-06-22 VITALS — BP 134/83 | HR 76 | Temp 97.3°F | Resp 17 | Ht 72.0 in | Wt 225.8 lb

## 2023-06-22 DIAGNOSIS — Z09 Encounter for follow-up examination after completed treatment for conditions other than malignant neoplasm: Secondary | ICD-10-CM

## 2023-06-22 DIAGNOSIS — K317 Polyp of stomach and duodenum: Secondary | ICD-10-CM | POA: Diagnosis not present

## 2023-06-22 DIAGNOSIS — Z86010 Personal history of colonic polyps: Secondary | ICD-10-CM

## 2023-06-22 DIAGNOSIS — D125 Benign neoplasm of sigmoid colon: Secondary | ICD-10-CM | POA: Diagnosis not present

## 2023-06-22 DIAGNOSIS — D124 Benign neoplasm of descending colon: Secondary | ICD-10-CM

## 2023-06-22 DIAGNOSIS — D123 Benign neoplasm of transverse colon: Secondary | ICD-10-CM | POA: Diagnosis not present

## 2023-06-22 DIAGNOSIS — K229 Disease of esophagus, unspecified: Secondary | ICD-10-CM

## 2023-06-22 DIAGNOSIS — K2100 Gastro-esophageal reflux disease with esophagitis, without bleeding: Secondary | ICD-10-CM

## 2023-06-22 DIAGNOSIS — F418 Other specified anxiety disorders: Secondary | ICD-10-CM | POA: Diagnosis not present

## 2023-06-22 DIAGNOSIS — J45909 Unspecified asthma, uncomplicated: Secondary | ICD-10-CM | POA: Diagnosis not present

## 2023-06-22 DIAGNOSIS — K219 Gastro-esophageal reflux disease without esophagitis: Secondary | ICD-10-CM | POA: Diagnosis not present

## 2023-06-22 DIAGNOSIS — G4733 Obstructive sleep apnea (adult) (pediatric): Secondary | ICD-10-CM | POA: Diagnosis not present

## 2023-06-22 DIAGNOSIS — F32A Depression, unspecified: Secondary | ICD-10-CM

## 2023-06-22 MED ORDER — SODIUM CHLORIDE 0.9 % IV SOLN: 500.0000 mL | Freq: Once | INTRAVENOUS | Status: DC

## 2023-06-22 NOTE — Op Note (Signed)
[facility] Patient Name: Jeremy Tucker Procedure Date: 06/22/2023 7:33 AM MRN: [id number] Endoscopist: Beverley Fiedler , MD, [id number] Age: 72 Referring MD:  Date of Birth: [birth year] Gender: Male Account #: [id number] Procedure:                Colonoscopy Indications:              Surveillance: Personal history of adenomatous                            polyps on last colonoscopy 5 years ago, Last                            colonoscopy: September 2019 ([facility]) Medicines:                Monitored Anesthesia Care Procedure:                Pre-Anesthesia Assessment:                           - Prior to the procedure, a History and Physical                            was performed, and patient medications and                            allergies were reviewed. The patient's tolerance of                            previous anesthesia was also reviewed. The risks                            and benefits of the procedure and the sedation                            options and risks were discussed with the patient.                            All questions were answered, and informed consent                            was obtained. Prior Anticoagulants: The patient has                            taken Eliquis (apixaban), last dose was 2 days                            prior to procedure. ASA Grade Assessment: III - A                            patient with severe systemic disease. After                            reviewing the risks and benefits, the patient was  deemed in satisfactory condition to undergo the                            procedure.                           After obtaining informed consent, the colonoscope                            was passed under direct vision. Throughout the                            procedure, the patient's blood pressure, pulse, and                            oxygen saturations were monitored continuously. The                             Olympus CF-HQ190L [id number] [id number]) Colonoscope was                            introduced through the anus and advanced to the                            cecum, identified by appendiceal orifice and                            ileocecal valve. The colonoscopy was performed                            without difficulty. The patient tolerated the                            procedure well. The quality of the bowel                            preparation was good. The ileocecal valve,                            appendiceal orifice, and rectum were photographed. Scope In: 8:20:54 AM Scope Out: 8:35:45 AM Scope Withdrawal Time: 0 hours 12 minutes 37 seconds  Total Procedure Duration: 0 hours 14 minutes 51 seconds  Findings:                 The digital rectal exam was normal.                           Two sessile polyps were found in the transverse                            colon. The polyps were 3 to 5 mm in size. These                            polyps were removed with a cold snare. Resection  and retrieval were complete.                           Two sessile polyps were found in the descending                            colon. The polyps were 2 to 3 mm in size. These                            polyps were removed with a cold snare. Resection                            and retrieval were complete.                           The retroflexed view of the distal rectum and anal                            verge was normal and showed no anal or rectal                            abnormalities. Complications:            No immediate complications. Estimated Blood Loss:     Estimated blood loss: none. Impression:               - Two 3 to 5 mm polyps in the transverse colon,                            removed with a cold snare. Resected and retrieved.                           - Two 2 to 3 mm polyps in the descending colon,                            removed with a cold  snare. Resected and retrieved.                           - The distal rectum and anal verge are normal on                            retroflexion view. Recommendation:           - Patient has a contact number available for                            emergencies. The signs and symptoms of potential                            delayed complications were discussed with the                            patient. Return to normal activities tomorrow.  Written discharge instructions were provided to the                            patient.                           - Resume previous diet.                           - Continue present medications.                           - Resume Eliquis (apixaban) at prior dose today.                            Refer to managing physician for further adjustment                            of therapy.                           - Await pathology results.                           - Repeat colonoscopy may be recommended for                            surveillance. The colonoscopy date will be                            determined after pathology results from today's                            exam become available for review. Beverley Fiedler, MD 06/22/2023 8:44:44 AM This report has been signed electronically.

## 2023-06-22 NOTE — Progress Notes (Signed)
Report to PACU, RN, vss, BBS= Clear.  

## 2023-06-22 NOTE — Progress Notes (Signed)
Pt's states no medical or surgical changes since previsit or office visit. 

## 2023-06-22 NOTE — Progress Notes (Signed)
GASTROENTEROLOGY PROCEDURE H&P NOTE   Primary Care Physician: Ronnald Nian, MD    Reason for Procedure:   GERD/nausea; history of colon polyps  Plan:    EGD and colonoscopy  Patient is appropriate for endoscopic procedure(s) in the ambulatory ([facility]) setting.  The nature of the procedure, as well as the risks, benefits, and alternatives were carefully and thoroughly reviewed with the patient. Ample time for discussion and questions allowed. The patient understood, was satisfied, and agreed to proceed.     HPI: Jeremy Tucker is a 72 y.o. male who presents for EGD and colonoscopy.  Medical history as below.  Tolerated the prep.  No recent chest pain or shortness of breath.  No abdominal pain today.  Eliquis hold x 2 days  Past Medical History:  Diagnosis Date   Anxiety    Arthritis    Asthma    Benign fundic gland polyps of stomach    BPH (benign prostatic hyperplasia)    BPH without obstruction/lower urinary tract symptoms    Depression    Diabetic peripheral neuropathy (HCC) 01/07/2019   Dyspnea    w/exertion   ED (erectile dysfunction)    Erosive esophagitis    Focal active colitis 2019   GERD (gastroesophageal reflux disease)    Hiatal hernia    History of chronic bronchitis    per pt used inhaler as needed   History of DVT of lower extremity 2001   s/p  left TKA completed blood thinner treatment,  per pt no previous clot and none since   History of gastric polyp    per pt benign   History of kidney stones    Hx of adenomatous colonic polyps    Hyperlipidemia    Mild CAD cardiologist--- dr h. Katrinka Blazing   nuclear study 04-14-2017 normal , low risk, nuclear ef 53%;  cardiac cath 09-19-2018  midRCA 30% stenosis otherwise normal coronaries,  heavy AV calicification with decreased mobility with peak grandiant   Mild dilation of ascending aorta (HCC)    per echo 01-13-2021  38mm   Nephrolithiasis    OA (osteoarthritis)    OSA (obstructive sleep apnea)    per  pt refused cpap, intolerant;  moderate osa per study in epic 03-08-2015   PAF (paroxysmal atrial fibrillation) (HCC)    followed by dr h. 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 and ep ablation 04/ 2016   Peroneal neuropathy at knee, right 12/05/2018   Phytobezoar    Prostate cancer [facility]) urologist--- dr dahlstedt/  oncology-- dr Kathrynn Running   first dx 03/ 2021,  Glesaon 3+4  PSA 6.95   Pulmonary nodule 2019   incidently finding on CTA 09-25-2018, stable   Right carpal tunnel syndrome 01/07/2019   Severe aortic stenosis    Type 2 diabetes mellitus (HCC)    followed by pcp   (03-02-2021  pt stated does not check blood sugar at home)   Umbilical hernia     Past Surgical History:  Procedure Laterality Date   AORTIC VALVE REPLACEMENT N/A 09/20/2021   Procedure: AORTIC VALVE REPLACEMENT (AVR) USING 25 MM INSPIRIS RESILIA  AORTIC VALVE;  Surgeon: Alleen Borne, MD;  Location: [facility] OR;  Service: Open Heart Surgery;  Laterality: N/A;   ATRIAL FIBRILLATION ABLATION N/A 03/12/2015   Procedure: ATRIAL FIBRILLATION ABLATION;  Surgeon: Hillis Range, MD;  Location: [facility] CATH LAB;  Service: Cardiovascular;  Laterality: N/A;   CARDIAC CATHETERIZATION  1990's   "Dr. Elsie Lincoln", no blockages per  pt   CARDIOVERSION N/A 02/25/2015   Procedure: CARDIOVERSION;  Surgeon: Quintella Reichert, MD;  Location: [facility] ENDOSCOPY;  Service: Cardiovascular;  Laterality: N/A;   CATARACT EXTRACTION W/ INTRAOCULAR LENS  IMPLANT, BILATERAL  2016   CLIPPING OF ATRIAL APPENDAGE Left 09/20/2021   Procedure: CLIPPING OF ATRIAL APPENDAGE USING ATRICURE 35 MM ATRICLIP;  Surgeon: Alleen Borne, MD;  Location: [facility] OR;  Service: Open Heart Surgery;  Laterality: Left;   COLONOSCOPY  last one 2013  dr Kinnie Scales   CYSTOSCOPY N/A 03/08/2021   Procedure: CYSTOSCOPY BLADDER CALCULI EXTRACTION ;  Surgeon: Marcine Matar, MD;  Location: [facility];  Service: Urology;  Laterality: N/A;   CYSTOSCOPY WITH RETROGRADE PYELOGRAM, URETEROSCOPY  AND STENT PLACEMENT Left 04/24/2015   Procedure: URETHRAL MEATAL DILATION, CYSTOSCOPY WITH LEFT RETROGRADE PYELOGRAM, LEFT URETEROSCOPY, STONE BASKET EXTRACTION,  LEFT DOUBLE J STENT PLACEMENT;  Surgeon: Jethro Bolus, MD;  Location: [facility] ORS;  Service: Urology;  Laterality: Left;   EP IMPLANTABLE DEVICE N/A 09/15/2015   Procedure: Loop Recorder Insertion;  Surgeon: Hillis Range, MD;  Location: [facility] INVASIVE CV LAB;  Service: Cardiovascular;  Laterality: N/A;   ESOPHAGOGASTRODUODENOSCOPY  last one 2017   EXTRACORPOREAL SHOCK WAVE LITHOTRIPSY Bilateral 04/02/2020   Procedure: EXTRACORPOREAL SHOCK WAVE LITHOTRIPSY (ESWL);  Surgeon: Malen Gauze, MD;  Location: [facility];  Service: Urology;  Laterality: Bilateral;   EXTRACORPOREAL SHOCK WAVE LITHOTRIPSY Left 04/06/2020   Procedure: EXTRACORPOREAL SHOCK WAVE LITHOTRIPSY (ESWL);  Surgeon: Crist Fat, MD;  Location: [facility];  Service: Urology;  Laterality: Left;   EYE SURGERY     HOLMIUM LASER APPLICATION Left 04/24/2015   Procedure: HOLMIUM LASER APPLICATION;  Surgeon: Jethro Bolus, MD;  Location: [facility]S;  Service: Urology;  Laterality: Left;   implantable loopr recorder removal  02/22/2021   MDT LINQ removed in office by Dr Johney Frame   KNEE ARTHROSCOPY Bilateral "multiple times"   LEFT HEART CATH AND CORONARY ANGIOGRAPHY N/A 09/19/2018   Procedure: LEFT HEART CATH AND CORONARY ANGIOGRAPHY;  Surgeon: Lyn Records, MD;  Location: [facility] INVASIVE CV LAB;  Service: Cardiovascular;  Laterality: N/A;   PERCUTANEOUS NEPHROSTOLITHOTOMY  1980s and 1990s   RADIOACTIVE SEED IMPLANT N/A 03/08/2021   Procedure: RADIOACTIVE SEED IMPLANT/BRACHYTHERAPY IMPLANT;  Surgeon: Marcine Matar, MD;  Location: [facility];  Service: Urology;  Laterality: N/A;  90 MINS   REPLACEMENT TOTAL KNEE BILATERAL     RIGHT/LEFT HEART CATH AND CORONARY ANGIOGRAPHY N/A 08/04/2021   Procedure: RIGHT/LEFT HEART CATH  AND CORONARY ANGIOGRAPHY;  Surgeon: Lyn Records, MD;  Location: [facility] INVASIVE CV LAB;  Service: Cardiovascular;  Laterality: N/A;   SINUS ENDO WITH FUSION Bilateral 12/24/2021   Procedure: BILATERAL ENDOSCOPIC SINUS SURGERY WITH INTRANASAL POLYPECTOMY AND FUSION NAVIGATION;  Surgeon: Osborn Coho, MD;  Location: [facility] OR;  Service: ENT;  Laterality: Bilateral;   SKIN BIOPSY Left 05/06/2020   Shave biopsy left posterior hand neurofiberoma.   SPACE OAR INSTILLATION N/A 03/08/2021   Procedure: SPACE OAR INSTILLATION;  Surgeon: Marcine Matar, MD;  Location: [facility];  Service: Urology;  Laterality: N/A;   TEE WITHOUT CARDIOVERSION N/A 03/12/2015   Procedure: TRANSESOPHAGEAL ECHOCARDIOGRAM (TEE);  Surgeon: Chrystie Nose, MD;  Location: [facility] ENDOSCOPY;  Service: Cardiovascular;  Laterality: N/A;   TEE WITHOUT CARDIOVERSION N/A 09/20/2021   Procedure: TRANSESOPHAGEAL ECHOCARDIOGRAM (TEE);  Surgeon: Alleen Borne, MD;  Location: [facility] OR;  Service: Open Heart Surgery;  Laterality: N/A;   TONSILLECTOMY  age  9   TOTAL KNEE ARTHROPLASTY Left 11-22-1999  @[facility]    TOTAL KNEE ARTHROPLASTY Right 10/30/2017   Procedure: RIGHT TOTAL KNEE ARTHROPLASTY;  Surgeon: Durene Romans, MD;  Location: [facility] ORS;  Service: Orthopedics;  Laterality: Right;  90 mins   UMBILICAL HERNIA REPAIR N/A 07/05/2021   Procedure: REPAIR INCARCERATED UMBILICAL HERNIA WITH MESH;  Surgeon: Violeta Gelinas, MD;  Location: [facility] OR;  Service: General;  Laterality: N/A;    Prior to Admission medications   Medication Sig Start Date End Date Taking? Authorizing Provider  acetaminophen (TYLENOL) 500 MG tablet Take 1,000 mg by mouth every 8 (eight) hours as needed for mild pain.   Yes [provider]  allopurinol (ZYLOPRIM) 300 MG tablet TAKE 1 TABLET BY MOUTH DAILY 10/19/22  Yes Ronnald Nian, MD  ammonium lactate (LAC-HYDRIN) 12 % lotion Apply 1 application topically daily as needed for dry skin.   Yes [provider]  buPROPion (WELLBUTRIN XL) 300 MG 24 hr tablet TAKE ONE TABLET BY MOUTH DAILY Patient taking differently: 150 mg daily. 10/27/22  Yes Ronnald Nian, MD  finasteride (PROSCAR) 5 MG tablet Take by mouth daily. 04/27/21  Yes [provider]  fluticasone (FLONASE) 50 MCG/ACT nasal spray Place 2 sprays into both nostrils daily. 12/08/20  Yes [provider]  loratadine (CLARITIN) 10 MG tablet Take 10 mg by mouth in the morning.   Yes [provider]  metoprolol succinate (TOPROL XL) 25 MG 24 hr tablet Take 1 tablet (25 mg total) by mouth daily. 05/24/23  Yes Chandrasekhar, Mahesh A, MD  Multiple Vitamins-Minerals (MULTIVITAMIN WITH MINERALS) tablet Take 1 tablet by mouth daily.   Yes [provider]  nystatin-triamcinolone ointment (MYCOLOG) Apply topically daily at 6 (six) AM. 03/20/23  Yes [provider]  olopatadine (PATANOL) 0.1 % ophthalmic solution Place 1 drop into both eyes daily as needed for allergies (tearing).   Yes [provider]  Omega-3 Fatty Acids (FISH OIL) 1000 MG CAPS Take 1,000 mg by mouth daily.   Yes [provider]  omeprazole (PRILOSEC) 20 MG capsule Take 20 mg by mouth daily.   Yes [provider]  rosuvastatin (CRESTOR) 40 MG tablet TAKE ONE TABLET BY MOUTH DAILY 03/27/23  Yes Ronnald Nian, MD  sertraline (ZOLOFT) 50 MG tablet TAKE 1 TABLET BY MOUTH DAILY 01/23/23  Yes Ronnald Nian, MD  sildenafil (VIAGRA) 100 MG tablet TAKE ONE TABLET BY MOUTH DAILY AS NEEDED FOR ERECTILE DYSFUNCTION 09/29/22  Yes Ronnald Nian, MD  tamsulosin (FLOMAX) 0.4 MG CAPS capsule Take 0.4 mg by mouth daily. Pt takes 1 tablet daily.   Yes [provider]  traZODone (DESYREL) 50 MG tablet TAKE 1/2 TO 1 TABLETS BY MOUTH AS NEEDED FOR SLEEP 11/22/21  Yes Ronnald Nian, MD  zolpidem (AMBIEN) 5 MG tablet Take 1 tablet (5 mg total) by mouth at bedtime as needed for sleep. 08/03/21  Yes Ronnald Nian, MD   albuterol (VENTOLIN HFA) 108 (90 Base) MCG/ACT inhaler INHALE 2 PUFFS INTO THE LUNGS EVERY 6 HOURS AS NEEDED FOR WHEEZING OR SHORTNESS OF BREATH Patient not taking: Reported on 06/08/2023 09/25/19   Ronnald Nian, MD  apixaban (ELIQUIS) 5 MG TABS tablet Take 1 tablet (5 mg total) by mouth 2 (two) times daily. 01/23/23   Pricilla Riffle, MD  Semaglutide, 2 MG/DOSE, (OZEMPIC, 2 MG/DOSE,) 8 MG/3ML SOPN DIAL AND INJECT 2MG  UNDER THE SKIN ONCE WEEKLY 05/08/23   Ronnald Nian, MD  Current Outpatient Medications  Medication Sig Dispense Refill   acetaminophen (TYLENOL) 500 MG tablet Take 1,000 mg by mouth every 8 (eight) hours as needed for mild pain.     allopurinol (ZYLOPRIM) 300 MG tablet TAKE 1 TABLET BY MOUTH DAILY 90 tablet 1   ammonium lactate (LAC-HYDRIN) 12 % lotion Apply 1 application topically daily as needed for dry skin.     buPROPion (WELLBUTRIN XL) 300 MG 24 hr tablet TAKE ONE TABLET BY MOUTH DAILY (Patient taking differently: 150 mg daily.) 90 tablet 1   finasteride (PROSCAR) 5 MG tablet Take by mouth daily.     fluticasone (FLONASE) 50 MCG/ACT nasal spray Place 2 sprays into both nostrils daily.     loratadine (CLARITIN) 10 MG tablet Take 10 mg by mouth in the morning.     metoprolol succinate (TOPROL XL) 25 MG 24 hr tablet Take 1 tablet (25 mg total) by mouth daily. 90 tablet 3   Multiple Vitamins-Minerals (MULTIVITAMIN WITH MINERALS) tablet Take 1 tablet by mouth daily.     nystatin-triamcinolone ointment (MYCOLOG) Apply topically daily at 6 (six) AM.     olopatadine (PATANOL) 0.1 % ophthalmic solution Place 1 drop into both eyes daily as needed for allergies (tearing).     Omega-3 Fatty Acids (FISH OIL) 1000 MG CAPS Take 1,000 mg by mouth daily.     omeprazole (PRILOSEC) 20 MG capsule Take 20 mg by mouth daily.     rosuvastatin (CRESTOR) 40 MG tablet TAKE ONE TABLET BY MOUTH DAILY 90 tablet 3   sertraline (ZOLOFT) 50 MG tablet TAKE 1 TABLET BY MOUTH DAILY 90 tablet 0    sildenafil (VIAGRA) 100 MG tablet TAKE ONE TABLET BY MOUTH DAILY AS NEEDED FOR ERECTILE DYSFUNCTION 20 tablet 1   tamsulosin (FLOMAX) 0.4 MG CAPS capsule Take 0.4 mg by mouth daily. Pt takes 1 tablet daily.     traZODone (DESYREL) 50 MG tablet TAKE 1/2 TO 1 TABLETS BY MOUTH AS NEEDED FOR SLEEP 30 tablet 0   zolpidem (AMBIEN) 5 MG tablet Take 1 tablet (5 mg total) by mouth at bedtime as needed for sleep. 15 tablet 1   albuterol (VENTOLIN HFA) 108 (90 Base) MCG/ACT inhaler INHALE 2 PUFFS INTO THE LUNGS EVERY 6 HOURS AS NEEDED FOR WHEEZING OR SHORTNESS OF BREATH (Patient not taking: Reported on 06/08/2023) 54 g 0   apixaban (ELIQUIS) 5 MG TABS tablet Take 1 tablet (5 mg total) by mouth 2 (two) times daily. 180 tablet 1   Semaglutide, 2 MG/DOSE, (OZEMPIC, 2 MG/DOSE,) 8 MG/3ML SOPN DIAL AND INJECT 2MG  UNDER THE SKIN ONCE WEEKLY 3 mL 1   Current Facility-Administered Medications  Medication Dose Route Frequency Provider Last Rate Last Admin   0.9 %  sodium chloride infusion  500 mL Intravenous Once , Carie Caddy, MD        Allergies as of 06/22/2023 - Review Complete 06/22/2023  Allergen Reaction Noted   Oxycodone Itching 01/21/2015    Family History  Problem Relation Age of Onset   Heart attack Mother 82       MI   Heart attack Father 88       ? MI versus trauma to head   Healthy Brother    Stroke Maternal Grandmother    Cancer Maternal Grandfather        had prostate removed   Heart attack Maternal Grandfather    Breast cancer Neg Hx    Colon cancer Neg Hx    Pancreatic cancer Neg Hx  Prostate cancer Neg Hx    Colon polyps Neg Hx    Esophageal cancer Neg Hx    Rectal cancer Neg Hx    Stomach cancer Neg Hx     Social History   Socioeconomic History   Marital status: Married    Spouse name: Not on file   Number of children: 1   Years of education: Not on file   Highest education level: Some college, no degree  Occupational History   Occupation: Retired  Tobacco Use    Smoking status: Never   Smokeless tobacco: Never  Vaping Use   Vaping status: Never Used  Substance and Sexual Activity   Alcohol use: Yes    Comment: very rare   Drug use: Never   Sexual activity: Yes  Other Topics Concern   Not on file  Social History Narrative   Pt lives in [place] with spouse.  41 yo son is health.  Second son died in car accident.      Owns [employer] [address]   Right handed   Caffeine 1-2 cups dail    Social Determinants of Health   Financial Resource Strain: Low Risk  (08/26/2022)   Overall Financial Resource Strain (CARDIA)    Difficulty of Paying Living Expenses: Not hard at all  Food Insecurity: No Food Insecurity (08/26/2022)   Hunger Vital Sign    Worried About Running Out of Food in the Last Year: Never true    Ran Out of Food in the Last Year: Never true  Transportation Needs: No Transportation Needs (08/26/2022)   PRAPARE - Administrator, Civil Service (Medical): No    Lack of Transportation (Non-Medical): No  Physical Activity: Inactive (08/26/2022)   Exercise Vital Sign    Days of Exercise per Week: 0 days    Minutes of Exercise per Session: 0 min  Stress: No Stress Concern Present (08/26/2022)   [employer] of Occupational Health - Occupational Stress Questionnaire    Feeling of Stress : Not at all  Social Connections: Not on file  Intimate Partner Violence: Not on file    Physical Exam: Vital signs in last 24 hours: @BP  122/82   Pulse 61   Temp (!) 97.3 F (36.3 C)   Ht 6' (1.829 m)   Wt 225 lb 12.8 oz (102.4 kg)   SpO2 96%   BMI 30.62 kg/m  GEN: NAD EYE: Sclerae anicteric ENT: MMM CV: Non-tachycardic Pulm: CTA b/l GI: Soft, NT/ND NEURO:  Alert & Oriented x 3   Erick Blinks, MD [facility]  06/22/2023 8:08 AM

## 2023-06-22 NOTE — Patient Instructions (Signed)
Handout on polyps given to patient  Await pathology results Resume previous diet and continue present medications - including daily omeprazole  Resume Eliquis (apixaban) at prior dose today refer to managing physician for further adjustment of therapy  Repeat colonoscopy for surveillance will be determined based off of pathology results    YOU HAD AN ENDOSCOPIC PROCEDURE TODAY AT THE [facility]:   Refer to the procedure report that was given to you for any specific questions about what was found during the examination.  If the procedure report does not answer your questions, please call your gastroenterologist to clarify.  If you requested that your care partner not be given the details of your procedure findings, then the procedure report has been included in a sealed envelope for you to review at your convenience later.  YOU SHOULD EXPECT: Some feelings of bloating in the abdomen. Passage of more gas than usual.  Walking can help get rid of the air that was put into your GI tract during the procedure and reduce the bloating. If you had a lower endoscopy (such as a colonoscopy or flexible sigmoidoscopy) you may notice spotting of blood in your stool or on the toilet paper. If you underwent a bowel prep for your procedure, you may not have a normal bowel movement for a few days.  Please Note:  You might notice some irritation and congestion in your nose or some drainage.  This is from the oxygen used during your procedure.  There is no need for concern and it should clear up in a day or so.  SYMPTOMS TO REPORT IMMEDIATELY:  Following lower endoscopy (colonoscopy or flexible sigmoidoscopy):  Excessive amounts of blood in the stool  Significant tenderness or worsening of abdominal pains  Swelling of the abdomen that is new, acute  Fever of 100F or higher  Following upper endoscopy (EGD)  Vomiting of blood or coffee ground material  New chest pain or pain under the shoulder  blades  Painful or persistently difficult swallowing  New shortness of breath  Fever of 100F or higher  Black, tarry-looking stools  For urgent or emergent issues, a gastroenterologist can be reached at any hour by calling [phone]. Do not use MyChart messaging for urgent concerns.    DIET:  We do recommend a small meal at first, but then you may proceed to your regular diet.  Drink plenty of fluids but you should avoid alcoholic beverages for 24 hours.  ACTIVITY:  You should plan to take it easy for the rest of today and you should NOT DRIVE or use heavy machinery until tomorrow (because of the sedation medicines used during the test).    FOLLOW UP: Our staff will call the number listed on your records the next business day following your procedure.  We will call around 7:15- 8:00 am to check on you and address any questions or concerns that you may have regarding the information given to you following your procedure. If we do not reach you, we will leave a message.     If any biopsies were taken you will be contacted by phone or by letter within the next 1-3 weeks.  Please call us at [phone] if you have not heard about the biopsies in 3 weeks.    SIGNATURES/CONFIDENTIALITY: You and/or your care partner have signed paperwork which will be entered into your electronic medical record.  These signatures attest to the fact that that the information above on your After Visit  Summary has been reviewed and is understood.  Full responsibility of the confidentiality of this discharge information lies with you and/or your care-partner.

## 2023-06-22 NOTE — Op Note (Signed)
[facility] Patient Name: Jeremy Tucker Procedure Date: 06/22/2023 7:33 AM MRN: [id number] Endoscopist: Beverley Fiedler , MD, [id number] Age: 72 Referring MD:  Date of Birth: [birth year] Gender: Male Account #: [id number] Procedure:                Upper GI endoscopy Indications:              Gastro-esophageal reflux disease, Nausea Medicines:                Monitored Anesthesia Care Procedure:                Pre-Anesthesia Assessment:                           - Prior to the procedure, a History and Physical                            was performed, and patient medications and                            allergies were reviewed. The patient's tolerance of                            previous anesthesia was also reviewed. The risks                            and benefits of the procedure and the sedation                            options and risks were discussed with the patient.                            All questions were answered, and informed consent                            was obtained. Prior Anticoagulants: The patient has                            taken Eliquis (apixaban), last dose was 2 days                            prior to procedure. ASA Grade Assessment: III - A                            patient with severe systemic disease. After                            reviewing the risks and benefits, the patient was                            deemed in satisfactory condition to undergo the                            procedure.  After obtaining informed consent, the endoscope was                            passed under direct vision. Throughout the                            procedure, the patient's blood pressure, pulse, and                            oxygen saturations were monitored continuously. The                            Olympus Scope [id number] was introduced through the                            mouth, and advanced to the second part of  duodenum.                            The upper GI endoscopy was accomplished without                            difficulty. The patient tolerated the procedure                            well. Scope In: Scope Out: Findings:                 The Z-line was irregular and was found 37 to 38 cm                            from the incisors. Biopsies were taken with a cold                            forceps for histology to evaluate for Barrett's                            esophagus.                           A 1 cm hiatal hernia was present.                           Multiple diminutive and small (all < 6 mm) sessile                            polyps were found in the cardia, in the gastric                            fundus and in the gastric body. These are benign                            appearing (small fundic gland polyps and a few  hyperplastic).                           The examined duodenum was normal. Complications:            No immediate complications. Estimated Blood Loss:     Estimated blood loss was minimal. Impression:               - Z-line irregular, 37 to 38 cm from the incisors.                            Biopsied to evaluate for Barrett's esophagus.                           - 1 cm hiatal hernia.                           - Multiple gastric polyps. Small and benign                            appearing.                           - Normal examined duodenum. Recommendation:           - Patient has a contact number available for                            emergencies. The signs and symptoms of potential                            delayed complications were discussed with the                            patient. Return to normal activities tomorrow.                            Written discharge instructions were provided to the                            patient.                           - Resume previous diet.                           - Continue  present medications including daily                            omeprazole.                           - Await pathology results.                           - See the other procedure note for documentation of  additional recommendations. Beverley Fiedler, MD 06/22/2023 8:42:14 AM This report has been signed electronically.

## 2023-06-22 NOTE — Telephone Encounter (Signed)
Is this okay to refill>? 

## 2023-06-23 ENCOUNTER — Telehealth: Payer: Self-pay | Admitting: *Deleted

## 2023-06-23 NOTE — Telephone Encounter (Signed)
  Follow up Call-     06/22/2023    7:15 AM  Call back number  Post procedure Call Back phone  # [phone]  Permission to leave phone message Yes     Patient questions:   Message left to call if necessary.

## 2023-06-23 NOTE — Telephone Encounter (Signed)
  Follow up Call-     06/22/2023    7:15 AM  Call back number  Post procedure Call Back phone  # [phone]  Permission to leave phone message Yes     Patient questions:  error

## 2023-06-26 ENCOUNTER — Encounter: Payer: Self-pay | Admitting: Internal Medicine

## 2023-06-28 ENCOUNTER — Ambulatory Visit: Payer: PPO | Admitting: Podiatry

## 2023-06-28 ENCOUNTER — Ambulatory Visit (INDEPENDENT_AMBULATORY_CARE_PROVIDER_SITE_OTHER): Payer: PPO

## 2023-06-28 ENCOUNTER — Encounter: Payer: Self-pay | Admitting: Podiatry

## 2023-06-28 DIAGNOSIS — M79672 Pain in left foot: Secondary | ICD-10-CM

## 2023-06-28 DIAGNOSIS — S92512A Displaced fracture of proximal phalanx of left lesser toe(s), initial encounter for closed fracture: Secondary | ICD-10-CM

## 2023-06-28 NOTE — Progress Notes (Signed)
Subjective:   Patient ID: Jeremy Tucker, male   DOB: 72 y.o.   MRN: [id number]   HPI Patient states he severely injured the fourth and fifth digits on his left foot and he has a lot of swelling and pain especially in the fifth digit.  States that he cannot wear shoe   Review of Systems  All other systems reviewed and are negative.       Objective:  Physical Exam Vitals and nursing note reviewed.  Constitutional:      Appearance: He is well-developed.  Pulmonary:     Effort: Pulmonary effort is normal.  Musculoskeletal:        General: Normal range of motion.  Skin:    General: Skin is warm.  Neurological:     Mental Status: He is alert.     Neurovascular status intact muscle strength found to be adequate range of motion within normal limits with patient found to have edema of the fourth and fifth digit left foot fluid buildup and swelling pain especially of the fifth digit itself.  Good digital perfusion     Assessment:  Probable fracture of fifth digit left with pain     Plan:  H&P reviewed discussed fracture and immobilization open toed shoes should heal but may take upwards of 2 months to heal.  If symptoms continue will be seen back  X-rays indicate fracture head of proximal phalanx digit 5 left

## 2023-07-01 ENCOUNTER — Other Ambulatory Visit: Payer: Self-pay | Admitting: Family Medicine

## 2023-07-01 DIAGNOSIS — E118 Type 2 diabetes mellitus with unspecified complications: Secondary | ICD-10-CM

## 2023-07-11 ENCOUNTER — Encounter: Payer: Self-pay | Admitting: Family Medicine

## 2023-07-11 ENCOUNTER — Telehealth (INDEPENDENT_AMBULATORY_CARE_PROVIDER_SITE_OTHER): Payer: PPO | Admitting: Family Medicine

## 2023-07-11 VITALS — Temp 97.2°F | Wt 216.0 lb

## 2023-07-11 DIAGNOSIS — Z23 Encounter for immunization: Secondary | ICD-10-CM

## 2023-07-11 DIAGNOSIS — U071 COVID-19: Secondary | ICD-10-CM

## 2023-07-11 MED ORDER — NIRMATRELVIR/RITONAVIR (PAXLOVID)TABLET
3.0000 | ORAL_TABLET | Freq: Two times a day (BID) | ORAL | 0 refills | Status: AC
Start: 2023-07-11 — End: 2023-07-16

## 2023-07-11 NOTE — Progress Notes (Addendum)
   Subjective:    Patient ID: Jeremy Tucker, male    DOB: [birth year], 72 y.o.   MRN: [id number]  HPI Documentation for virtual audio and video telecommunications through Caregility encounter:  The patient was located at home. 2 patient identifiers used.  The provider was located in the office. The patient did consent to this visit and is aware of possible charges through their insurance for this visit. The other persons participating in this telemedicine service were none. Time spent on call was 5 minutes and in review of previous records >15 minutes total for counseling and coordination of care. This virtual service is not related to other E/M service within previous 7 days.  He states a longstanding he woke up having difficulty with malaise and fatigue.  I continued with some myalgias some nausea and coughing and he initially tested negative however he tested again today and the COVID test was positive.  No fever, sore throat, earache, shortness of breath.  He has been using Delsym for the coughing.  Review of Systems     Objective:    Physical Exam Alert and in no distress otherwise not examined.       Assessment & Plan:   COVID-19 vaccine administered - Plan: nirmatrelvir/ritonavir (PAXLOVID) 20 x 150 MG & 10 x 100MG  TABS Recommend Tylenol for fever aches and pains as well as Delsym.  Discussed that when he feels better, he can then go out in public but to wear a mask for another 5 days.  Explained that if he has further difficulty with fever, shortness of breath and coughing, these would be reasons to get more aggressive and he is to call for further consultation.  He felt comfortable with this information.   Also instructed him to hold the Flomax for a few days while on the Paxil bid and be cognizant of any bleeding issues since he is on Eliquis and potential interaction.

## 2023-07-29 ENCOUNTER — Other Ambulatory Visit: Payer: Self-pay | Admitting: Family Medicine

## 2023-07-29 DIAGNOSIS — E118 Type 2 diabetes mellitus with unspecified complications: Secondary | ICD-10-CM

## 2023-07-31 ENCOUNTER — Telehealth: Payer: Self-pay

## 2023-07-31 NOTE — Telephone Encounter (Signed)
Request faxed for ozempic 2 mg. Last appt. 10/18/22. Next appt. 09/01/23

## 2023-08-01 ENCOUNTER — Other Ambulatory Visit: Payer: Self-pay | Admitting: Family Medicine

## 2023-08-01 DIAGNOSIS — F32A Depression, unspecified: Secondary | ICD-10-CM

## 2023-08-01 NOTE — Telephone Encounter (Signed)
Last apt 07/11/23.

## 2023-08-08 DIAGNOSIS — R350 Frequency of micturition: Secondary | ICD-10-CM | POA: Diagnosis not present

## 2023-08-08 DIAGNOSIS — C61 Malignant neoplasm of prostate: Secondary | ICD-10-CM | POA: Diagnosis not present

## 2023-08-08 DIAGNOSIS — Z8546 Personal history of malignant neoplasm of prostate: Secondary | ICD-10-CM | POA: Diagnosis not present

## 2023-08-08 DIAGNOSIS — N200 Calculus of kidney: Secondary | ICD-10-CM | POA: Diagnosis not present

## 2023-08-24 ENCOUNTER — Other Ambulatory Visit: Payer: Self-pay | Admitting: Internal Medicine

## 2023-08-24 DIAGNOSIS — I480 Paroxysmal atrial fibrillation: Secondary | ICD-10-CM

## 2023-08-24 NOTE — Telephone Encounter (Signed)
Eliquis 5mg  refill request received. Patient is 72 years old, weight-98kg, Crea-0.90 on 05/11/23, Diagnosis-Afib & DVT, and last seen by [facility] on 05/15/23. Dose is appropriate based on dosing criteria. Will send in refill to requested pharmacy.

## 2023-08-29 ENCOUNTER — Ambulatory Visit: Payer: PPO

## 2023-08-29 DIAGNOSIS — Z0000 Encounter for general adult medical examination without abnormal findings: Secondary | ICD-10-CM

## 2023-08-29 NOTE — Progress Notes (Signed)
Subjective:   Jeremy Tucker is a 72 y.o. male who presents for Medicare Annual/Subsequent preventive examination.  Visit Complete: Virtual I connected with  CRISTON CHANCELLOR on 08/29/23 by a audio enabled telemedicine application and verified that I am speaking with the correct person using two identifiers.  Patient Location: Home  Provider Location: Office/Clinic  I discussed the limitations of evaluation and management by telemedicine. The patient expressed understanding and agreed to proceed.  Vital Signs: Because this visit was a virtual/telehealth visit, some criteria may be missing or patient reported. Any vitals not documented were not able to be obtained and vitals that have been documented are patient reported.    Cardiac Risk Factors include: advanced age (>66men, >32 women);diabetes mellitus;dyslipidemia;hypertension;male gender     Objective:    Today's Vitals   There is no height or weight on file to calculate BMI.     08/29/2023    9:57 AM 08/26/2022   10:10 AM 12/15/2021    1:23 PM 09/20/2021    9:47 AM 09/16/2021    2:34 PM 09/15/2021    1:32 PM 08/04/2021   10:07 AM  Advanced Directives  Does Patient Have a Medical Advance Directive? Yes Yes Yes Yes Yes Yes Yes  Type of Estate agent of [place];Living will Healthcare Power of [place];Living will Healthcare Power of [place];Living will Healthcare Power of [place];Living will Healthcare Power of [place];Living will  Living will;Healthcare Power of Attorney  Does patient want to make changes to medical advance directive?   No - Patient declined No - Patient declined No - Patient declined  No - Patient declined  Copy of Healthcare Power of Attorney in Chart? No - copy requested No - copy requested No - copy requested No - copy requested No - copy requested  No - copy requested    Current Medications (verified) Outpatient Encounter Medications as of 08/29/2023  Medication Sig   acetaminophen  (TYLENOL) 500 MG tablet Take 1,000 mg by mouth every 8 (eight) hours as needed for mild pain.   albuterol (VENTOLIN HFA) 108 (90 Base) MCG/ACT inhaler INHALE 2 PUFFS INTO THE LUNGS EVERY 6 HOURS AS NEEDED FOR WHEEZING OR SHORTNESS OF BREATH   allopurinol (ZYLOPRIM) 300 MG tablet TAKE 1 TABLET BY MOUTH DAILY   buPROPion (WELLBUTRIN XL) 300 MG 24 hr tablet TAKE 1 TABLET BY MOUTH DAILY   ELIQUIS 5 MG TABS tablet TAKE 1 TABLET BY MOUTH TWO TIMES A DAY   fluticasone (FLONASE) 50 MCG/ACT nasal spray Place 2 sprays into both nostrils daily.   loratadine (CLARITIN) 10 MG tablet Take 10 mg by mouth in the morning.   metoprolol succinate (TOPROL XL) 25 MG 24 hr tablet Take 1 tablet (25 mg total) by mouth daily.   Multiple Vitamins-Minerals (MULTIVITAMIN WITH MINERALS) tablet Take 1 tablet by mouth daily.   olopatadine (PATANOL) 0.1 % ophthalmic solution Place 1 drop into both eyes daily as needed for allergies (tearing).   Omega-3 Fatty Acids (FISH OIL) 1000 MG CAPS Take 1,000 mg by mouth daily.   omeprazole (PRILOSEC) 20 MG capsule Take 20 mg by mouth daily.   OZEMPIC, 2 MG/DOSE, 8 MG/3ML SOPN DIAL AND INJECT 2 MG UNDER THE SKIN ONCE WEEKLY   rosuvastatin (CRESTOR) 40 MG tablet TAKE ONE TABLET BY MOUTH DAILY   sertraline (ZOLOFT) 50 MG tablet TAKE 1 TABLET BY MOUTH DAILY   sildenafil (VIAGRA) 100 MG tablet TAKE ONE TABLET BY MOUTH DAILY AS NEEDED FOR ERECTILE DYSFUNCTION  tamsulosin (FLOMAX) 0.4 MG CAPS capsule Take 0.4 mg by mouth daily. Pt takes 1 tablet daily.   ammonium lactate (LAC-HYDRIN) 12 % lotion Apply 1 application topically daily as needed for dry skin. (Patient not taking: Reported on 07/11/2023)   finasteride (PROSCAR) 5 MG tablet Take by mouth daily. (Patient not taking: Reported on 08/29/2023)   nystatin-triamcinolone ointment (MYCOLOG) Apply topically daily at 6 (six) AM. (Patient not taking: Reported on 07/11/2023)   traZODone (DESYREL) 50 MG tablet TAKE 1/2 TO 1 TABLETS BY MOUTH AS  NEEDED FOR SLEEP (Patient not taking: Reported on 08/29/2023)   zolpidem (AMBIEN) 5 MG tablet Take 1 tablet (5 mg total) by mouth at bedtime as needed for sleep. (Patient not taking: Reported on 08/29/2023)   No facility-administered encounter medications on file as of 08/29/2023.    Allergies (verified) Oxycodone   History: Past Medical History:  Diagnosis Date   Anxiety    Arthritis    Asthma    Benign fundic gland polyps of stomach    BPH (benign prostatic hyperplasia)    BPH without obstruction/lower urinary tract symptoms    Depression    Diabetic peripheral neuropathy (HCC) 01/07/2019   Dyspnea    w/exertion   ED (erectile dysfunction)    Erosive esophagitis    Focal active colitis 2019   GERD (gastroesophageal reflux disease)    Hiatal hernia    History of chronic bronchitis    per pt used inhaler as needed   History of DVT of lower extremity 2001   s/p  left TKA completed blood thinner treatment,  per pt no previous clot and none since   History of gastric polyp    per pt benign   History of kidney stones    Hx of adenomatous colonic polyps    Hyperlipidemia    Mild CAD cardiologist--- dr h. Katrinka Blazing   nuclear study 04-14-2017 normal , low risk, nuclear ef 53%;  cardiac cath 09-19-2018  midRCA 30% stenosis otherwise normal coronaries,  heavy AV calicification with decreased mobility with peak grandiant   Mild dilation of ascending aorta (HCC)    per echo 01-13-2021  38mm   Nephrolithiasis    OA (osteoarthritis)    OSA (obstructive sleep apnea)    per pt refused cpap, intolerant;  moderate osa per study in epic 03-08-2015   PAF (paroxysmal atrial fibrillation) (HCC)    followed by dr h. 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 and ep ablation 04/ 2016   Peroneal neuropathy at knee, right 12/05/2018   Phytobezoar    Prostate cancer [facility]) urologist--- dr dahlstedt/  oncology-- dr Kathrynn Running   first dx 03/ 2021,  [place] 3+4  PSA 6.95   Pulmonary nodule 2019    incidently finding on CTA 09-25-2018, stable   Right carpal tunnel syndrome 01/07/2019   Severe aortic stenosis    Type 2 diabetes mellitus (HCC)    followed by pcp   (03-02-2021  pt stated does not check blood sugar at home)   Umbilical hernia    Past Surgical History:  Procedure Laterality Date   AORTIC VALVE REPLACEMENT N/A 09/20/2021   Procedure: AORTIC VALVE REPLACEMENT (AVR) USING 25 MM INSPIRIS RESILIA  AORTIC VALVE;  Surgeon: Alleen Borne, MD;  Location: [facility] OR;  Service: Open Heart Surgery;  Laterality: N/A;   ATRIAL FIBRILLATION ABLATION N/A 03/12/2015   Procedure: ATRIAL FIBRILLATION ABLATION;  Surgeon: Hillis Range, MD;  Location: [facility] CATH LAB;  Service: Cardiovascular;  Laterality: N/A;  CARDIAC CATHETERIZATION  1990's   "Dr. Elsie Lincoln", no blockages per pt   CARDIOVERSION N/A 02/25/2015   Procedure: CARDIOVERSION;  Surgeon: Quintella Reichert, MD;  Location: [facility] ENDOSCOPY;  Service: Cardiovascular;  Laterality: N/A;   CATARACT EXTRACTION W/ INTRAOCULAR LENS  IMPLANT, BILATERAL  2016   CLIPPING OF ATRIAL APPENDAGE Left 09/20/2021   Procedure: CLIPPING OF ATRIAL APPENDAGE USING ATRICURE 35 MM ATRICLIP;  Surgeon: Alleen Borne, MD;  Location: [facility] OR;  Service: Open Heart Surgery;  Laterality: Left;   COLONOSCOPY  last one 2013  dr Kinnie Scales   CYSTOSCOPY N/A 03/08/2021   Procedure: CYSTOSCOPY BLADDER CALCULI EXTRACTION ;  Surgeon: Marcine Matar, MD;  Location: [facility];  Service: Urology;  Laterality: N/A;   CYSTOSCOPY WITH RETROGRADE PYELOGRAM, URETEROSCOPY AND STENT PLACEMENT Left 04/24/2015   Procedure: URETHRAL MEATAL DILATION, CYSTOSCOPY WITH LEFT RETROGRADE PYELOGRAM, LEFT URETEROSCOPY, STONE BASKET EXTRACTION,  LEFT DOUBLE J STENT PLACEMENT;  Surgeon: Jethro Bolus, MD;  Location: [facility] ORS;  Service: Urology;  Laterality: Left;   EP IMPLANTABLE DEVICE N/A 09/15/2015   Procedure: Loop Recorder Insertion;  Surgeon: Hillis Range, MD;  [facility] INVASIVE CV  LAB;  Service: Cardiovascular;  Laterality: N/A;   ESOPHAGOGASTRODUODENOSCOPY  last one 2017   EXTRACORPOREAL SHOCK WAVE LITHOTRIPSY Bilateral 04/02/2020   Procedure: EXTRACORPOREAL SHOCK WAVE LITHOTRIPSY (ESWL);  Surgeon: Malen Gauze, MD;  Location: [facility];  Service: Urology;  Laterality: Bilateral;   EXTRACORPOREAL SHOCK WAVE LITHOTRIPSY Left 04/06/2020   Procedure: EXTRACORPOREAL SHOCK WAVE LITHOTRIPSY (ESWL);  Surgeon: Crist Fat, MD;  Location: [facility];  Service: Urology;  Laterality: Left;   EYE SURGERY     HOLMIUM LASER APPLICATION Left 04/24/2015   Procedure: HOLMIUM LASER APPLICATION;  Surgeon: Jethro Bolus, MD;  Location: [facility]S;  Service: Urology;  Laterality: Left;   implantable loopr recorder removal  02/22/2021   MDT LINQ removed in office by Dr Johney Frame   KNEE ARTHROSCOPY Bilateral "multiple times"   LEFT HEART CATH AND CORONARY ANGIOGRAPHY N/A 09/19/2018   Procedure: LEFT HEART CATH AND CORONARY ANGIOGRAPHY;  Surgeon: Lyn Records, MD;  Location: [facility] INVASIVE CV LAB;  Service: Cardiovascular;  Laterality: N/A;   PERCUTANEOUS NEPHROSTOLITHOTOMY  1980s and 1990s   RADIOACTIVE SEED IMPLANT N/A 03/08/2021   Procedure: RADIOACTIVE SEED IMPLANT/BRACHYTHERAPY IMPLANT;  Surgeon: Marcine Matar, MD;  Location[facility];  Service: Urology;  Laterality: N/A;  90 MINS   REPLACEMENT TOTAL KNEE BILATERAL     RIGHT/LEFT HEART CATH AND CORONARY ANGIOGRAPHY N/A 08/04/2021   Procedure: RIGHT/LEFT HEART CATH AND CORONARY ANGIOGRAPHY;  Surgeon: Lyn Records, MD;  [facility] INVASIVE CV LAB;  Service: Cardiovascular;  Laterality: N/A;   SINUS ENDO WITH FUSION Bilateral 12/24/2021   Procedure: BILATERAL ENDOSCOPIC SINUS SURGERY WITH INTRANASAL POLYPECTOMY AND FUSION NAVIGATION;  Surgeon: Osborn Coho, MD;  Location[facility] OR;  Service: ENT;  Laterality: Bilateral;   SKIN BIOPSY Left 05/06/2020   Shave biopsy left  posterior hand neurofiberoma.   SPACE OAR INSTILLATION N/A 03/08/2021   Procedure: SPACE OAR INSTILLATION;  Surgeon: Marcine Matar, MD;  Location: [facility];  Service: Urology;  Laterality: N/A;   TEE WITHOUT CARDIOVERSION N/A 03/12/2015   Procedure: TRANSESOPHAGEAL ECHOCARDIOGRAM (TEE);  Surgeon: Chrystie Nose, MD;  Location: [facility] ENDOSCOPY;  Service: Cardiovascular;  Laterality: N/A;   TEE WITHOUT CARDIOVERSION N/A 09/20/2021   Procedure: TRANSESOPHAGEAL ECHOCARDIOGRAM (TEE);  Surgeon: Alleen Borne, MD;  Location: [facility] OR;  Service:  Open Heart Surgery;  Laterality: N/A;   TONSILLECTOMY  age 83   TOTAL KNEE ARTHROPLASTY Left 11-22-1999  @[facility]    TOTAL KNEE ARTHROPLASTY Right 10/30/2017   Procedure: RIGHT TOTAL KNEE ARTHROPLASTY;  Surgeon: Durene Romans, MD;  Location: [facility]S;  Service: Orthopedics;  Laterality: Right;  90 mins   UMBILICAL HERNIA REPAIR N/A 07/05/2021   Procedure: REPAIR INCARCERATED UMBILICAL HERNIA WITH MESH;  Surgeon: Violeta Gelinas, MD;  Location: [facility] OR;  Service: General;  Laterality: N/A;   Family History  Problem Relation Age of Onset   Heart attack Mother 68       MI   Heart attack Father 60       ? MI versus trauma to head   Healthy Brother    Stroke Maternal Grandmother    Cancer Maternal Grandfather        had prostate removed   Heart attack Maternal Grandfather    Breast cancer Neg Hx    Colon cancer Neg Hx    Pancreatic cancer Neg Hx    Prostate cancer Neg Hx    Colon polyps Neg Hx    Esophageal cancer Neg Hx    Rectal cancer Neg Hx    Stomach cancer Neg Hx    Social History   Socioeconomic History   Marital status: Married    Spouse name: Not on file   Number of children: 1   Years of education: Not on file   Highest education level: Some college, no degree  Occupational History   Occupation: Retired  Tobacco Use   Smoking status: Never   Smokeless tobacco: Never  Vaping Use   Vaping status: Never Used  Substance and  Sexual Activity   Alcohol use: Not Currently    Comment: very rare   Drug use: Never   Sexual activity: Yes  Other Topics Concern   Not on file  Social History Narrative   Pt lives in [place] with spouse.  47 yo son is health.  Second son died in car accident.      Owns [employer] [address]   Right handed   Caffeine 1-2 cups dail    Social Determinants of Health   Financial Resource Strain: Low Risk  (08/29/2023)   Overall Financial Resource Strain (CARDIA)    Difficulty of Paying Living Expenses: Not hard at all  Food Insecurity: No Food Insecurity (08/29/2023)   Hunger Vital Sign    Worried About Running Out of Food in the Last Year: Never true    Ran Out of Food in the Last Year: Never true  Transportation Needs: No Transportation Needs (08/29/2023)   PRAPARE - Administrator, Civil Service (Medical): No    Lack of Transportation (Non-Medical): No  Physical Activity: Inactive (08/29/2023)   Exercise Vital Sign    Days of Exercise per Week: 0 days    Minutes of Exercise per Session: 0 min  Stress: No Stress Concern Present (08/29/2023)   [employer] of Occupational Health - Occupational Stress Questionnaire    Feeling of Stress : Not at all  Social Connections: Moderately Integrated (08/29/2023)   Social Connection and Isolation Panel [NHANES]    Frequency of Communication with Friends and Family: More than three times a week    Frequency of Social Gatherings with Friends and Family: More than three times a week    Attends Religious Services: More than 4 times per year    Active Member of Clubs or Organizations: No  Attends Banker Meetings: Never    Marital Status: Married    Tobacco Counseling Counseling given: Not Answered   Clinical Intake:  Pre-visit preparation completed: Yes  Pain : No/denies pain     Nutritional Risks: None Diabetes: Yes CBG done?: No Did pt. bring in CBG monitor from home?: No  How  often do you need to have someone help you when you read instructions, pamphlets, or other written materials from your doctor or pharmacy?: 1 - Never  Interpreter Needed?: No  Information entered by :: NAllen LPN   Activities of Daily Living    08/29/2023    9:47 AM  In your present state of health, do you have any difficulty performing the following activities:  Hearing? 0  Vision? 0  Difficulty concentrating or making decisions? 0  Walking or climbing stairs? 0  Dressing or bathing? 0  Doing errands, shopping? 0  Preparing Food and eating ? N  Using the Toilet? N  In the past six months, have you accidently leaked urine? N  Do you have problems with loss of bowel control? N  Managing your Medications? N  Managing your Finances? N  Housekeeping or managing your Housekeeping? N    Patient Care Team: Ronnald Nian, MD as PCP - General (Family Medicine) Hillis Range, MD (Inactive) as PCP - Electrophysiology (Cardiology) Christell Constant, MD as PCP - Cardiology (Cardiology) Shirlean Kelly, MD as Consulting Physician (Neurosurgery) Durene Romans, MD as Consulting Physician (Orthopedic Surgery) Felicita Gage, RN as Oncology Nurse Navigator Marcine Matar, MD as Consulting Physician (Urology)  Indicate any recent Medical Services you may have received from other than [facility] providers in the past year (date may be approximate).     Assessment:   This is a routine wellness examination for [place].  Hearing/Vision screen Hearing Screening - Comments:: Denies hearing issues Vision Screening - Comments:: Regular eye exams, [facility]   Goals Addressed             This Visit's Progress    Patient Stated       08/29/2023, start working out with weights       Depression Screen    08/29/2023    9:59 AM 08/26/2022   10:11 AM 08/03/2021   10:14 AM 03/04/2021   12:42 PM 11/19/2018    9:55 AM  PHQ 2/9 Scores  PHQ - 2 Score 0 0 0 0 3  PHQ- 9 Score  0   4     Fall Risk    08/29/2023    9:58 AM 01/31/2023   11:56 AM 08/26/2022   10:10 AM 08/03/2021   10:13 AM 03/04/2021   12:42 PM  Fall Risk   Falls in the past year? 0 0 0 0 0  Number falls in past yr: 0 0 0 0 0  Injury with Fall? 0 0 0 0 0  Risk for fall due to : Medication side effect No Fall Risks Medication side effect No Fall Risks No Fall Risks  Follow up Falls prevention discussed;Falls evaluation completed Falls evaluation completed Falls prevention discussed;Education provided;Falls evaluation completed Falls evaluation completed Falls evaluation completed    MEDICARE RISK AT HOME: Medicare Risk at Home Any stairs in or around the home?: No If so, are there any without handrails?: No Home free of loose throw rugs in walkways, pet beds, electrical cords, etc?: Yes Adequate lighting in your home to reduce risk of falls?: Yes Life alert?: No Use of a cane,  walker or w/c?: No Grab bars in the bathroom?: Yes Shower chair or bench in shower?: Yes Elevated toilet seat or a handicapped toilet?: Yes  TIMED UP AND GO:  Was the test performed?  No    Cognitive Function:        08/29/2023   10:00 AM 08/26/2022   10:13 AM  6CIT Screen  What Year? 0 points 0 points  What month? 0 points 0 points  What time? 0 points 0 points  Count back from 20 0 points 0 points  Months in reverse 0 points 0 points  Repeat phrase 0 points 0 points  Total Score 0 points 0 points    Immunizations Immunization History  Administered Date(s) Administered   Fluad Quad(high Dose 65+) 10/02/2020, 08/03/2021, 09/27/2022   PFIZER(Purple Top)SARS-COV-2 Vaccination 12/19/2019, 01/09/2020, 10/02/2020   Pfizer Covid-19 Vaccine Bivalent Booster 75yrs & up 09/07/2021   Pfizer(Comirnaty)Fall Seasonal Vaccine 12 years and older 09/27/2022   Pneumococcal Conjugate-13 08/16/2016   Pneumococcal Polysaccharide-23 02/19/2018   Tdap 05/31/2006, 09/07/2018   Zoster Recombinant(Shingrix) 09/07/2018   Zoster,  Live 04/23/2012    TDAP status: Up to date  Flu Vaccine status: Due, Education has been provided regarding the importance of this vaccine. Advised may receive this vaccine at local pharmacy or Health Dept. Aware to provide a copy of the vaccination record if obtained from local pharmacy or Health Dept. Verbalized acceptance and understanding.  Pneumococcal vaccine status: Up to date  Covid-19 vaccine status: Information provided on how to obtain vaccines.   Qualifies for Shingles Vaccine? Yes   Zostavax completed Yes   Shingrix Completed?: needs second dose  Screening Tests Health Maintenance  Topic Date Due   Zoster Vaccines- Shingrix (2 of 2) 11/02/2018   OPHTHALMOLOGY EXAM  07/20/2022   Diabetic kidney evaluation - Urine ACR  08/03/2022   FOOT EXAM  08/03/2022   HEMOGLOBIN A1C  04/19/2023   INFLUENZA VACCINE  06/15/2023   COVID-19 Vaccine (6 - 2023-24 season) 07/16/2023   Diabetic kidney evaluation - eGFR measurement  05/10/2024   Medicare Annual Wellness (AWV)  08/28/2024   Colonoscopy  06/21/2026   DTaP/Tdap/Td (3 - Td or Tdap) 09/07/2028   Pneumonia Vaccine 39+ Years old  Completed   Hepatitis C Screening  Completed   HPV VACCINES  Aged Out    Health Maintenance  Health Maintenance Due  Topic Date Due   Zoster Vaccines- Shingrix (2 of 2) 11/02/2018   OPHTHALMOLOGY EXAM  07/20/2022   Diabetic kidney evaluation - Urine ACR  08/03/2022   FOOT EXAM  08/03/2022   HEMOGLOBIN A1C  04/19/2023   INFLUENZA VACCINE  06/15/2023   COVID-19 Vaccine (6 - 2023-24 season) 07/16/2023    Colorectal cancer screening: Type of screening: Colonoscopy. Completed 06/22/2023. Repeat every 3-5 years  Lung Cancer Screening: (Low Dose CT Chest recommended if Age 35-80 years, 20 pack-year currently smoking OR have quit w/in 15years.) does not qualify.   Lung Cancer Screening Referral: no  Additional Screening:  Hepatitis C Screening: does qualify; Completed 03/21/2016  Vision Screening:  Recommended annual ophthalmology exams for early detection of glaucoma and other disorders of the eye. Is the patient up to date with their annual eye exam?  No  Who is the provider or what is the name of the office in which the patient attends annual eye exams? [facility] If pt is not established with a provider, would they like to be referred to a provider to establish care? No .  Dental Screening: Recommended annual dental exams for proper oral hygiene  Diabetic Foot Exam: Diabetic Foot Exam: Overdue, Pt has been advised about the importance in completing this exam. Pt is scheduled for diabetic foot exam on next appointment.  Community Resource Referral / Chronic Care Management: CRR required this visit?  No   CCM required this visit?  No     Plan:     I have personally reviewed and noted the following in the patient's chart:   Medical and social history Use of alcohol, tobacco or illicit drugs  Current medications and supplements including opioid prescriptions. Patient is not currently taking opioid prescriptions. Functional ability and status Nutritional status Physical activity Advanced directives List of other physicians Hospitalizations, surgeries, and ER visits in previous 12 months Vitals Screenings to include cognitive, depression, and falls Referrals and appointments  In addition, I have reviewed and discussed with patient certain preventive protocols, quality metrics, and best practice recommendations. A written personalized care plan for preventive services as well as general preventive health recommendations were provided to patient.     Barb Merino, LPN   [id number]   After Visit Summary: (MyChart) Due to this being a telephonic visit, the after visit summary with patients personalized plan was offered to patient via MyChart   Nurse Notes: none

## 2023-08-29 NOTE — Patient Instructions (Signed)
Jeremy Tucker , Thank you for taking time to come for your Medicare Wellness Visit. I appreciate your ongoing commitment to your health goals. Please review the following plan we discussed and let me know if I can assist you in the future.   Referrals/Orders/Follow-Ups/Clinician Recommendations: none  This is a list of the screening recommended for you and due dates:  Health Maintenance  Topic Date Due   Zoster (Shingles) Vaccine (2 of 2) 11/02/2018   Eye exam for diabetics  07/20/2022   Yearly kidney health urinalysis for diabetes  08/03/2022   Complete foot exam   08/03/2022   Hemoglobin A1C  04/19/2023   Flu Shot  06/15/2023   COVID-19 Vaccine (6 - 2023-24 season) 07/16/2023   Yearly kidney function blood test for diabetes  05/10/2024   Medicare Annual Wellness Visit  08/28/2024   Colon Cancer Screening  06/21/2026   DTaP/Tdap/Td vaccine (3 - Td or Tdap) 09/07/2028   Pneumonia Vaccine  Completed   Hepatitis C Screening  Completed   HPV Vaccine  Aged Out    Advanced directives: (Copy Requested) Please bring a copy of your health care power of attorney and living will to the office to be added to your chart at your convenience.  Next Medicare Annual Wellness Visit scheduled for next year: Yes  insert Preventive Care attachment Insert FALL PREVENTION attachment if needed

## 2023-09-05 ENCOUNTER — Other Ambulatory Visit: Payer: Self-pay | Admitting: Family Medicine

## 2023-09-05 ENCOUNTER — Other Ambulatory Visit: Payer: Self-pay | Admitting: *Deleted

## 2023-09-05 ENCOUNTER — Ambulatory Visit (INDEPENDENT_AMBULATORY_CARE_PROVIDER_SITE_OTHER): Payer: PPO | Admitting: Family Medicine

## 2023-09-05 ENCOUNTER — Encounter: Payer: Self-pay | Admitting: Family Medicine

## 2023-09-05 VITALS — BP 122/60 | HR 70 | Ht 72.0 in | Wt 226.6 lb

## 2023-09-05 DIAGNOSIS — E118 Type 2 diabetes mellitus with unspecified complications: Secondary | ICD-10-CM

## 2023-09-05 DIAGNOSIS — Z23 Encounter for immunization: Secondary | ICD-10-CM

## 2023-09-05 DIAGNOSIS — I5189 Other ill-defined heart diseases: Secondary | ICD-10-CM | POA: Diagnosis not present

## 2023-09-05 DIAGNOSIS — Z87442 Personal history of urinary calculi: Secondary | ICD-10-CM

## 2023-09-05 DIAGNOSIS — I10 Essential (primary) hypertension: Secondary | ICD-10-CM

## 2023-09-05 DIAGNOSIS — E1169 Type 2 diabetes mellitus with other specified complication: Secondary | ICD-10-CM | POA: Diagnosis not present

## 2023-09-05 DIAGNOSIS — F32A Depression, unspecified: Secondary | ICD-10-CM

## 2023-09-05 DIAGNOSIS — I480 Paroxysmal atrial fibrillation: Secondary | ICD-10-CM

## 2023-09-05 DIAGNOSIS — N201 Calculus of ureter: Secondary | ICD-10-CM

## 2023-09-05 DIAGNOSIS — C61 Malignant neoplasm of prostate: Secondary | ICD-10-CM

## 2023-09-05 DIAGNOSIS — F419 Anxiety disorder, unspecified: Secondary | ICD-10-CM | POA: Diagnosis not present

## 2023-09-05 DIAGNOSIS — I350 Nonrheumatic aortic (valve) stenosis: Secondary | ICD-10-CM

## 2023-09-05 DIAGNOSIS — Z8679 Personal history of other diseases of the circulatory system: Secondary | ICD-10-CM

## 2023-09-05 DIAGNOSIS — E669 Obesity, unspecified: Secondary | ICD-10-CM | POA: Diagnosis not present

## 2023-09-05 DIAGNOSIS — E782 Mixed hyperlipidemia: Secondary | ICD-10-CM | POA: Diagnosis not present

## 2023-09-05 DIAGNOSIS — Z860101 Personal history of adenomatous and serrated colon polyps: Secondary | ICD-10-CM

## 2023-09-05 DIAGNOSIS — E785 Hyperlipidemia, unspecified: Secondary | ICD-10-CM

## 2023-09-05 DIAGNOSIS — N529 Male erectile dysfunction, unspecified: Secondary | ICD-10-CM

## 2023-09-05 DIAGNOSIS — G4733 Obstructive sleep apnea (adult) (pediatric): Secondary | ICD-10-CM | POA: Diagnosis not present

## 2023-09-05 DIAGNOSIS — G479 Sleep disorder, unspecified: Secondary | ICD-10-CM

## 2023-09-05 DIAGNOSIS — Z0000 Encounter for general adult medical examination without abnormal findings: Secondary | ICD-10-CM

## 2023-09-05 LAB — POCT GLYCOSYLATED HEMOGLOBIN (HGB A1C): Hemoglobin A1C: 6.7 % — AB (ref 4.0–5.6)

## 2023-09-05 MED ORDER — SILDENAFIL CITRATE 100 MG PO TABS: 100.0000 mg | ORAL_TABLET | ORAL | 5 refills | Status: AC | PRN

## 2023-09-05 MED ORDER — OZEMPIC (2 MG/DOSE) 8 MG/3ML ~~LOC~~ SOPN: 1.0000 | PEN_INJECTOR | SUBCUTANEOUS | 1 refills | Status: DC

## 2023-09-05 MED ORDER — ALLOPURINOL 300 MG PO TABS: 300.0000 mg | ORAL_TABLET | Freq: Every day | ORAL | 1 refills | Status: DC

## 2023-09-05 MED ORDER — SERTRALINE HCL 50 MG PO TABS: 50.0000 mg | ORAL_TABLET | Freq: Every day | ORAL | 0 refills | Status: DC

## 2023-09-05 MED ORDER — ROSUVASTATIN CALCIUM 40 MG PO TABS: 40.0000 mg | ORAL_TABLET | Freq: Every day | ORAL | 3 refills | Status: DC

## 2023-09-05 MED ORDER — ZOLPIDEM TARTRATE ER 6.25 MG PO TBCR: 6.2500 mg | EXTENDED_RELEASE_TABLET | Freq: Every evening | ORAL | 0 refills | Status: AC | PRN

## 2023-09-05 MED ORDER — BUPROPION HCL ER (SR) 150 MG PO TB12: 150.0000 mg | ORAL_TABLET | Freq: Every day | ORAL | 3 refills | Status: DC

## 2023-09-05 MED ORDER — OLOPATADINE HCL 0.1 % OP SOLN: 1.0000 [drp] | Freq: Every day | OPHTHALMIC | 2 refills | Status: DC | PRN

## 2023-09-05 NOTE — Progress Notes (Signed)
Complete physical exam  Patient: Jeremy Tucker   DOB: [birth year]   72 y.o. Male  MRN: [id number]  Subjective:    Chief Complaint  Patient presents with   Annual Exam    Annual exam. No new concerns. Had covid 07/11/23, going to wait until 10/11/23 for covid vaccine.  Needs refills on ambien, ozempic and patanol eye drops.    Jeremy Tucker is a 72 y.o. male who presents today for a complete physical exam. He reports consuming a general diet.  Walks 3-5 times per week.  He has tried trazodone without much success and would like to be placed back on Ambien which seems to work better for him.  He states that he is planning on using it only a couple of times per month.  He generally feels well.  He recently have COVID and we will wait to get the injection.  He has had some difficulty with dizziness as well as a least 1 episode of  double vision.  He does have plans to see Dr. Dione Booze.  He does have a history of cataract.  He also has a history of adenomatous colonic polyp and is scheduled for follow-up colonoscopy in 2029.  He does have a history of renal stones but has not had any recently.  Does have BPH and does follow-up regularly with urology.  He also has a previous history of prostate cancer.  He has had difficulty with ED and has had injections which he states only helped minimally.  He would like a refill on his Viagra because he states that that also does help but not as well as he would like.  He continues to do quite nicely on Ozempic for his diabetes.  Continues on Crestor.  He is also taking Wellbutrin and sertraline.  He is taking 150 mg of Wellbutrin he states that psychologically he seems to be doing okay... He does not have additional problems to discuss today.    Most recent fall risk assessment:    08/29/2023    9:58 AM  Fall Risk   Falls in the past year? 0  Number falls in past yr: 0  Injury with Fall? 0  Risk for fall due to : Medication side effect  Follow up Falls  prevention discussed;Falls evaluation completed     Most recent depression screenings:    08/29/2023    9:59 AM 08/26/2022   10:11 AM  PHQ 2/9 Scores  PHQ - 2 Score 0 0  PHQ- 9 Score  0    Vision:Not within last year  and needs to sch w/ Dr. Dione Booze.    Patient Care Team: Ronnald Nian, MD as PCP - General (Family Medicine) Hillis Range, MD (Inactive) as PCP - Electrophysiology (Cardiology) Christell Constant, MD as PCP - Cardiology (Cardiology) Shirlean Kelly, MD as Consulting Physician (Neurosurgery) Durene Romans, MD as Consulting Physician (Orthopedic Surgery) Felicita Gage, RN as Oncology Nurse Navigator Marcine Matar, MD as Consulting Physician (Urology)  Dione Booze, Christopher-Optho  Outpatient Medications Prior to Visit  Medication Sig Note   acetaminophen (TYLENOL) 500 MG tablet Take 1,000 mg by mouth every 8 (eight) hours as needed for mild pain. 09/05/2023: Took 1000mg  this am   ammonium lactate (LAC-HYDRIN) 12 % lotion Apply 1 application  topically daily as needed for dry skin.    ELIQUIS 5 MG TABS tablet TAKE 1 TABLET BY MOUTH TWO TIMES A DAY    fluticasone (FLONASE) 50 MCG/ACT nasal spray Place 2 sprays  into both nostrils daily.    loratadine (CLARITIN) 10 MG tablet Take 10 mg by mouth in the morning.    metoprolol succinate (TOPROL XL) 25 MG 24 hr tablet Take 1 tablet (25 mg total) by mouth daily.    Multiple Vitamins-Minerals (MULTIVITAMIN WITH MINERALS) tablet Take 1 tablet by mouth daily.    Omega-3 Fatty Acids (FISH OIL) 1000 MG CAPS Take 1,000 mg by mouth daily.    omeprazole (PRILOSEC) 20 MG capsule Take 20 mg by mouth daily.    tamsulosin (FLOMAX) 0.4 MG CAPS capsule Take 0.4 mg by mouth daily. Pt takes 1 tablet daily.    [DISCONTINUED] allopurinol (ZYLOPRIM) 300 MG tablet TAKE 1 TABLET BY MOUTH DAILY    [DISCONTINUED] buPROPion (WELLBUTRIN XL) 300 MG 24 hr tablet TAKE 1 TABLET BY MOUTH DAILY    [DISCONTINUED] olopatadine (PATANOL) 0.1 %  ophthalmic solution Place 1 drop into both eyes daily as needed for allergies (tearing).    [DISCONTINUED] OZEMPIC, 2 MG/DOSE, 8 MG/3ML SOPN DIAL AND INJECT 2 MG UNDER THE SKIN ONCE WEEKLY 09/05/2023: Takes on Thursday   [DISCONTINUED] rosuvastatin (CRESTOR) 40 MG tablet TAKE ONE TABLET BY MOUTH DAILY    [DISCONTINUED] sertraline (ZOLOFT) 50 MG tablet TAKE 1 TABLET BY MOUTH DAILY    albuterol (VENTOLIN HFA) 108 (90 Base) MCG/ACT inhaler INHALE 2 PUFFS INTO THE LUNGS EVERY 6 HOURS AS NEEDED FOR WHEEZING OR SHORTNESS OF BREATH (Patient not taking: Reported on 09/05/2023) 09/05/2023: As needed   nystatin-triamcinolone ointment (MYCOLOG) Apply topically daily at 6 (six) AM. (Patient not taking: Reported on 07/11/2023)    traZODone (DESYREL) 50 MG tablet TAKE 1/2 TO 1 TABLETS BY MOUTH AS NEEDED FOR SLEEP (Patient not taking: Reported on 08/29/2023) 09/05/2023: Does not work   [DISCONTINUED] finasteride (PROSCAR) 5 MG tablet Take by mouth daily. (Patient not taking: Reported on 08/29/2023)    [DISCONTINUED] sildenafil (VIAGRA) 100 MG tablet TAKE ONE TABLET BY MOUTH DAILY AS NEEDED FOR ERECTILE DYSFUNCTION    [DISCONTINUED] zolpidem (AMBIEN) 5 MG tablet Take 1 tablet (5 mg total) by mouth at bedtime as needed for sleep. (Patient not taking: Reported on 08/29/2023)    No facility-administered medications prior to visit.    ROS        Objective:     BP 122/60   Pulse 70   Ht 6' (1.829 m)   Wt 226 lb 9.6 oz (102.8 kg)   BMI 30.73 kg/m    Physical Exam  Alert and in no distress. Tympanic membranes and canals are normal. Pharyngeal area is normal. Neck is supple without adenopathy or thyromegaly. Cardiac exam shows an irregular  rhythm without murmurs or gallops. Lungs are clear to auscultation. Foot exam is normal. Results for orders placed or performed in visit on 09/05/23  POCT glycosylated hemoglobin (Hb A1C)  Result Value Ref Range   Hemoglobin A1C 6.7 (A) 4.0 - 5.6 %   HbA1c POC (<>  result, manual entry)     HbA1c, POC (prediabetic range)     HbA1c, POC (controlled diabetic range)         Assessment & Plan:   Need for influenza vaccination - Plan: Flu Vaccine Trivalent High Dose (Fluad)  Controlled type 2 diabetes mellitus with complication, without long-term current use of insulin (HCC) - Plan: POCT glycosylated hemoglobin (Hb A1C), Semaglutide, 2 MG/DOSE, (OZEMPIC, 2 MG/DOSE,) 8 MG/3ML SOPN, CANCELED: POCT UA - Microalbumin  Diastolic dysfunction-grade 15 Sep 2014  Essential hypertension  Hx of adenomatous colonic polyps  Mixed hyperlipidemia - Plan: Lipid panel  Malignant neoplasm of prostate (HCC)  Obesity (BMI 30-39.9)  OSA (obstructive sleep apnea)  H/O aortic valve repair  Paroxysmal atrial fibrillation (HCC)  History of renal stone - Plan: allopurinol (ZYLOPRIM) 300 MG tablet  Hyperlipidemia associated with type 2 diabetes mellitus (HCC) - Plan: rosuvastatin (CRESTOR) 40 MG tablet  Anxiety and depression - Plan: sertraline (ZOLOFT) 50 MG tablet, buPROPion (WELLBUTRIN SR) 150 MG 12 hr tablet  Erectile dysfunction, unspecified erectile dysfunction type - Plan: sildenafil (VIAGRA) 100 MG tablet  Sleep disturbance  Immunization History  Administered Date(s) Administered   Fluad Quad(high Dose 65+) 10/02/2020, 08/03/2021, 09/27/2022   Fluad Trivalent(High Dose 65+) 09/05/2023   PFIZER(Purple Top)SARS-COV-2 Vaccination 12/19/2019, 01/09/2020, 10/02/2020   Pfizer Covid-19 Vaccine Bivalent Booster 74yrs & up 09/07/2021   Pfizer(Comirnaty)Fall Seasonal Vaccine 12 years and older 09/27/2022   Pneumococcal Conjugate-13 08/16/2016   Pneumococcal Polysaccharide-23 02/19/2018   Tdap 05/31/2006, 09/07/2018   Zoster Recombinant(Shingrix) 09/07/2018   Zoster, Live 04/23/2012    Health Maintenance  Topic Date Due   Zoster Vaccines- Shingrix (2 of 2) 11/02/2018   OPHTHALMOLOGY EXAM  07/20/2022   Diabetic kidney evaluation - Urine ACR  08/03/2022    COVID-19 Vaccine (6 - 2023-24 season) 07/16/2023   HEMOGLOBIN A1C  03/05/2024   Diabetic kidney evaluation - eGFR measurement  05/10/2024   Medicare Annual Wellness (AWV)  08/28/2024   FOOT EXAM  09/04/2024   Colonoscopy  06/21/2026   DTaP/Tdap/Td (3 - Td or Tdap) 09/07/2028   Pneumonia Vaccine 81+ Years old  Completed   INFLUENZA VACCINE  Completed   Hepatitis C Screening  Completed   HPV VACCINES  Aged Out    Discussed health benefits of physical activity, and encouraged him to engage in regular exercise appropriate for his age and condition.  Problem List Items Addressed This Visit     Controlled type 2 diabetes mellitus with complication, without long-term current use of insulin (HCC)   Relevant Medications   Semaglutide, 2 MG/DOSE, (OZEMPIC, 2 MG/DOSE,) 8 MG/3ML SOPN   rosuvastatin (CRESTOR) 40 MG tablet   Other Relevant Orders   POCT glycosylated hemoglobin (Hb A1C) (Completed)   Diastolic dysfunction-grade 15 Sep 2014   ED (erectile dysfunction)   Relevant Medications   sildenafil (VIAGRA) 100 MG tablet   Essential hypertension   Relevant Medications   rosuvastatin (CRESTOR) 40 MG tablet   sildenafil (VIAGRA) 100 MG tablet   History of renal stone   Relevant Medications   allopurinol (ZYLOPRIM) 300 MG tablet   Hx of adenomatous colonic polyps   Hyperlipidemia   Relevant Medications   rosuvastatin (CRESTOR) 40 MG tablet   sildenafil (VIAGRA) 100 MG tablet   Other Relevant Orders   Lipid panel   Hyperlipidemia associated with type 2 diabetes mellitus (HCC)   Relevant Medications   Semaglutide, 2 MG/DOSE, (OZEMPIC, 2 MG/DOSE,) 8 MG/3ML SOPN   rosuvastatin (CRESTOR) 40 MG tablet   sildenafil (VIAGRA) 100 MG tablet   Malignant neoplasm of prostate (HCC)   Relevant Medications   allopurinol (ZYLOPRIM) 300 MG tablet   Obesity (BMI 30-39.9)   Relevant Medications   Semaglutide, 2 MG/DOSE, (OZEMPIC, 2 MG/DOSE,) 8 MG/3ML SOPN   OSA (obstructive sleep apnea)    Paroxysmal atrial fibrillation (HCC)   Relevant Medications   rosuvastatin (CRESTOR) 40 MG tablet   sildenafil (VIAGRA) 100 MG tablet   Severe aortic stenosis   Relevant Medications   rosuvastatin (CRESTOR) 40 MG tablet   sildenafil (VIAGRA) 100  MG tablet   Other Visit Diagnoses     Need for influenza vaccination    -  Primary   Relevant Orders   Flu Vaccine Trivalent High Dose (Fluad) (Completed)   Anxiety and depression       Relevant Medications   sertraline (ZOLOFT) 50 MG tablet   buPROPion (WELLBUTRIN SR) 150 MG 12 hr tablet   Sleep disturbance          He will follow-up with urology as well as cardiology and ophthalmology.  Discussed the use of Ozempic and we will continue him on that.  Will also cut down on his Wellbutrin to 150 mg.   Sharlot Gowda, MD

## 2023-09-06 ENCOUNTER — Other Ambulatory Visit: Payer: PPO

## 2023-09-06 LAB — LIPID PANEL
Chol/HDL Ratio: 2.6 ratio (ref 0.0–5.0)
Cholesterol, Total: 133 mg/dL (ref 100–199)
HDL: 52 mg/dL (ref >39)
LDL Chol Calc (NIH): 61 mg/dL (ref 0–99)
Triglycerides: 112 mg/dL (ref 0–149)
VLDL Cholesterol Cal: 20 mg/dL (ref 5–40)

## 2023-09-20 DIAGNOSIS — D492 Neoplasm of unspecified behavior of bone, soft tissue, and skin: Secondary | ICD-10-CM | POA: Diagnosis not present

## 2023-09-20 DIAGNOSIS — J301 Allergic rhinitis due to pollen: Secondary | ICD-10-CM | POA: Diagnosis not present

## 2023-09-20 DIAGNOSIS — Z961 Presence of intraocular lens: Secondary | ICD-10-CM | POA: Diagnosis not present

## 2023-09-20 DIAGNOSIS — H04123 Dry eye syndrome of bilateral lacrimal glands: Secondary | ICD-10-CM | POA: Diagnosis not present

## 2023-09-20 DIAGNOSIS — H43813 Vitreous degeneration, bilateral: Secondary | ICD-10-CM | POA: Diagnosis not present

## 2023-09-21 DIAGNOSIS — D492 Neoplasm of unspecified behavior of bone, soft tissue, and skin: Secondary | ICD-10-CM | POA: Diagnosis not present

## 2023-09-21 DIAGNOSIS — L919 Hypertrophic disorder of the skin, unspecified: Secondary | ICD-10-CM | POA: Diagnosis not present

## 2023-10-09 ENCOUNTER — Telehealth: Payer: Self-pay

## 2023-10-09 NOTE — Patient Outreach (Signed)
Attempted to contact patient regarding care gaps. Left voicemail for patient to return my call at [phone].  Nicholes Rough, CMA Care Guide [employer]

## 2023-10-28 ENCOUNTER — Other Ambulatory Visit: Payer: Self-pay | Admitting: Family Medicine

## 2023-10-28 DIAGNOSIS — F419 Anxiety disorder, unspecified: Secondary | ICD-10-CM

## 2023-10-28 DIAGNOSIS — F32A Depression, unspecified: Secondary | ICD-10-CM

## 2023-11-29 ENCOUNTER — Other Ambulatory Visit: Payer: Self-pay | Admitting: Family Medicine

## 2023-11-29 DIAGNOSIS — L821 Other seborrheic keratosis: Secondary | ICD-10-CM | POA: Diagnosis not present

## 2023-11-29 DIAGNOSIS — F419 Anxiety disorder, unspecified: Secondary | ICD-10-CM

## 2023-11-29 DIAGNOSIS — Z85828 Personal history of other malignant neoplasm of skin: Secondary | ICD-10-CM | POA: Diagnosis not present

## 2023-11-29 DIAGNOSIS — D2271 Melanocytic nevi of right lower limb, including hip: Secondary | ICD-10-CM | POA: Diagnosis not present

## 2023-11-29 DIAGNOSIS — D225 Melanocytic nevi of trunk: Secondary | ICD-10-CM | POA: Diagnosis not present

## 2023-11-29 DIAGNOSIS — D485 Neoplasm of uncertain behavior of skin: Secondary | ICD-10-CM | POA: Diagnosis not present

## 2023-11-29 DIAGNOSIS — L814 Other melanin hyperpigmentation: Secondary | ICD-10-CM | POA: Diagnosis not present

## 2023-11-29 DIAGNOSIS — L570 Actinic keratosis: Secondary | ICD-10-CM | POA: Diagnosis not present

## 2023-11-29 DIAGNOSIS — D1801 Hemangioma of skin and subcutaneous tissue: Secondary | ICD-10-CM | POA: Diagnosis not present

## 2023-12-14 ENCOUNTER — Telehealth: Payer: Self-pay | Admitting: Family Medicine

## 2023-12-14 DIAGNOSIS — J40 Bronchitis, not specified as acute or chronic: Secondary | ICD-10-CM

## 2023-12-14 MED ORDER — AZITHROMYCIN 500 MG PO TABS: 500.0000 mg | ORAL_TABLET | Freq: Every day | ORAL | 1 refills | Status: DC

## 2023-12-14 NOTE — Telephone Encounter (Signed)
He has a weeklong history of difficulty with a productive cough.  In the past he has treated his bronchitis with azithromycin.  I will call this again for him with a refill if needed.

## 2023-12-14 NOTE — Telephone Encounter (Signed)
Pt asks if you can please give him a call. He says his bronchitis is acting up.

## 2023-12-18 ENCOUNTER — Encounter (HOSPITAL_BASED_OUTPATIENT_CLINIC_OR_DEPARTMENT_OTHER): Payer: Self-pay | Admitting: Emergency Medicine

## 2023-12-18 ENCOUNTER — Other Ambulatory Visit: Payer: Self-pay

## 2023-12-18 ENCOUNTER — Emergency Department (HOSPITAL_BASED_OUTPATIENT_CLINIC_OR_DEPARTMENT_OTHER)
Admission: EM | Admit: 2023-12-18 | Discharge: 2023-12-18 | Disposition: A | Discharge status: Home / Self Care | Payer: PPO | Attending: Emergency Medicine | Admitting: Emergency Medicine

## 2023-12-18 ENCOUNTER — Telehealth: Payer: Self-pay | Admitting: Internal Medicine

## 2023-12-18 ENCOUNTER — Other Ambulatory Visit (HOSPITAL_BASED_OUTPATIENT_CLINIC_OR_DEPARTMENT_OTHER): Payer: Self-pay

## 2023-12-18 ENCOUNTER — Emergency Department (HOSPITAL_BASED_OUTPATIENT_CLINIC_OR_DEPARTMENT_OTHER): Payer: PPO | Admitting: Radiology

## 2023-12-18 DIAGNOSIS — Z7901 Long term (current) use of anticoagulants: Secondary | ICD-10-CM | POA: Diagnosis not present

## 2023-12-18 DIAGNOSIS — R008 Other abnormalities of heart beat: Secondary | ICD-10-CM | POA: Insufficient documentation

## 2023-12-18 DIAGNOSIS — R918 Other nonspecific abnormal finding of lung field: Secondary | ICD-10-CM | POA: Diagnosis not present

## 2023-12-18 DIAGNOSIS — R079 Chest pain, unspecified: Secondary | ICD-10-CM | POA: Diagnosis not present

## 2023-12-18 DIAGNOSIS — R001 Bradycardia, unspecified: Secondary | ICD-10-CM | POA: Diagnosis not present

## 2023-12-18 DIAGNOSIS — R5383 Other fatigue: Secondary | ICD-10-CM | POA: Insufficient documentation

## 2023-12-18 DIAGNOSIS — R0789 Other chest pain: Secondary | ICD-10-CM | POA: Diagnosis not present

## 2023-12-18 LAB — CBC
HCT: 41.4 % (ref 39.0–52.0)
Hemoglobin: 14.3 g/dL (ref 13.0–17.0)
MCH: 32.2 pg (ref 26.0–34.0)
MCHC: 34.5 g/dL (ref 30.0–36.0)
MCV: 93.2 fL (ref 80.0–100.0)
Platelets: 175 10*3/uL (ref 150–400)
RBC: 4.44 MIL/uL (ref 4.22–5.81)
RDW: 12.6 % (ref 11.5–15.5)
WBC: 8.5 10*3/uL (ref 4.0–10.5)
nRBC: 0 % (ref 0.0–0.2)

## 2023-12-18 LAB — BASIC METABOLIC PANEL
Anion gap: 11 (ref 5–15)
BUN: 17 mg/dL (ref 8–23)
CO2: 23 mmol/L (ref 22–32)
Calcium: 9.2 mg/dL (ref 8.9–10.3)
Chloride: 105 mmol/L (ref 98–111)
Creatinine, Ser: 0.91 mg/dL (ref 0.61–1.24)
GFR, Estimated: >60 mL/min (ref >60)
Glucose, Bld: 231 mg/dL — ABNORMAL HIGH (ref 70–99)
Potassium: 4.1 mmol/L (ref 3.5–5.1)
Sodium: 139 mmol/L (ref 135–145)

## 2023-12-18 LAB — TROPONIN I (HIGH SENSITIVITY): Troponin I (High Sensitivity): 12 ng/L (ref <18)

## 2023-12-18 MED ORDER — SODIUM CHLORIDE 0.9 % IV BOLUS
500.0000 mL | Freq: Once | INTRAVENOUS | Status: AC
  Administered 2023-12-18: 500 mL via INTRAVENOUS

## 2023-12-18 MED ORDER — AMOXICILLIN-POT CLAVULANATE 875-125 MG PO TABS
1.0000 | ORAL_TABLET | Freq: Two times a day (BID) | ORAL | 0 refills | Status: DC
  Filled 2023-12-18: qty 14, 7d supply, fill #0

## 2023-12-18 NOTE — ED Notes (Signed)
 Discharge paperwork given and verbally understood.

## 2023-12-18 NOTE — ED Provider Notes (Signed)
[place] EMERGENCY DEPARTMENT AT [facility] Provider Note   CSN: [id number] Arrival date & time: 12/18/23  1404     History  Chief Complaint  Patient presents with   Chest Pain    Jeremy Tucker is a 73 y.o. male.  73 yo M with a chief complaints of feeling sluggish.  He said he woke up this morning and felt a bit more fatigued than normal.  He checked his vital signs with a portable device he has at home and notes that his heart rate was in the 30s.  He did up calling a friend who is a doctor who told him that that was up to normal and he should call his cardiologist.  The patient then called his cardiologist and was instructed to come here for evaluation.  He is having some what he calls twinges to the left lateral aspect of his chest.  Nothing seems to make this better or worse.  But also has been going on since this morning.  He is getting over the flu.  Was recently given a prescription for azithromycin which she has completed for her bronchitis.   Chest Pain      Home Medications Prior to Admission medications   Medication Sig Start Date End Date Taking? Authorizing Provider  amoxicillin-clavulanate (AUGMENTIN) 875-125 MG tablet Take 1 tablet by mouth every 12 (twelve) hours. 12/18/23  Yes Melene Plan, DO  acetaminophen (TYLENOL) 500 MG tablet Take 1,000 mg by mouth every 8 (eight) hours as needed for mild pain.    [provider]  albuterol (VENTOLIN HFA) 108 (90 Base) MCG/ACT inhaler INHALE 2 PUFFS INTO THE LUNGS EVERY 6 HOURS AS NEEDED FOR WHEEZING OR SHORTNESS OF BREATH Patient not taking: Reported on 09/05/2023 09/25/19   Ronnald Nian, MD  allopurinol (ZYLOPRIM) 300 MG tablet Take 1 tablet (300 mg total) by mouth daily. 09/05/23   Ronnald Nian, MD  ammonium lactate (LAC-HYDRIN) 12 % lotion Apply 1 application  topically daily as needed for dry skin.    [provider]  azithromycin (ZITHROMAX) 500 MG tablet Take 1 tablet (500 mg total) by  mouth daily. 12/14/23   Ronnald Nian, MD  buPROPion (WELLBUTRIN SR) 150 MG 12 hr tablet Take 1 tablet (150 mg total) by mouth daily. 09/05/23   Ronnald Nian, MD  ELIQUIS 5 MG TABS tablet TAKE 1 TABLET BY MOUTH TWO TIMES A DAY 08/24/23   Chandrasekhar, Mahesh A, MD  fluticasone (FLONASE) 50 MCG/ACT nasal spray Place 2 sprays into both nostrils daily. 12/08/20   [provider]  loratadine (CLARITIN) 10 MG tablet Take 10 mg by mouth in the morning.    [provider]  metoprolol succinate (TOPROL XL) 25 MG 24 hr tablet Take 1 tablet (25 mg total) by mouth daily. 05/24/23   Christell Constant, MD  Multiple Vitamins-Minerals (MULTIVITAMIN WITH MINERALS) tablet Take 1 tablet by mouth daily.    [provider]  olopatadine (PATANOL) 0.1 % ophthalmic solution Place 1 drop into both eyes daily as needed for allergies (tearing). 09/05/23   Ronnald Nian, MD  Omega-3 Fatty Acids (FISH OIL) 1000 MG CAPS Take 1,000 mg by mouth daily.    [provider]  omeprazole (PRILOSEC) 20 MG capsule Take 20 mg by mouth daily.    [provider]  OZEMPIC, 2 MG/DOSE, 8 MG/3ML SOPN DIAL AND INJECT 2MG  UNDER THE SKIN ONCE WEEKLY 09/05/23   Ronnald Nian, MD  rosuvastatin (  CRESTOR) 40 MG tablet Take 1 tablet (40 mg total) by mouth daily. 09/05/23   Ronnald Nian, MD  Semaglutide, 2 MG/DOSE, (OZEMPIC, 2 MG/DOSE,) 8 MG/3ML SOPN Inject 1 Application into the skin once a week. 09/05/23   Ronnald Nian, MD  sertraline (ZOLOFT) 50 MG tablet TAKE 1 TABLET BY MOUTH DAILY 10/30/23   Ronnald Nian, MD  sildenafil (VIAGRA) 100 MG tablet Take 1 tablet (100 mg total) by mouth as needed for erectile dysfunction. 09/05/23   Ronnald Nian, MD  tamsulosin (FLOMAX) 0.4 MG CAPS capsule Take 0.4 mg by mouth daily. Pt takes 1 tablet daily.    [provider]  traZODone (DESYREL) 50 MG tablet TAKE 1/2 TO 1 TABLETS BY MOUTH AS NEEDED FOR SLEEP Patient not taking: Reported on  08/29/2023 11/22/21   Ronnald Nian, MD  zolpidem (AMBIEN CR) 6.25 MG CR tablet Take 1 tablet (6.25 mg total) by mouth at bedtime as needed for sleep. 09/05/23   Ronnald Nian, MD      Allergies    Oxycodone    Review of Systems   Review of Systems  Cardiovascular:  Positive for chest pain.    Physical Exam Updated Vital Signs BP 129/63   Pulse (!) 40   Temp 97.8 F (36.6 C) (Oral)   Resp 11   SpO2 96%  Physical Exam Vitals and nursing note reviewed.  Constitutional:      Appearance: He is well-developed.  HENT:     Head: Normocephalic and atraumatic.  Eyes:     Pupils: Pupils are equal, round, and reactive to light.  Neck:     Vascular: No JVD.  Cardiovascular:     Rate and Rhythm: Normal rate and regular rhythm.     Heart sounds: No murmur heard.    No friction rub. No gallop.  Pulmonary:     Effort: No respiratory distress.     Breath sounds: No wheezing.  Abdominal:     General: There is no distension.     Tenderness: There is no abdominal tenderness. There is no guarding or rebound.  Musculoskeletal:        General: Normal range of motion.     Cervical back: Normal range of motion and neck supple.  Skin:    Coloration: Skin is not pale.     Findings: No rash.  Neurological:     Mental Status: He is alert and oriented to person, place, and time.  Psychiatric:        Behavior: Behavior normal.     ED Results / Procedures / Treatments   Labs (all labs ordered are listed, but only abnormal results are displayed) Labs Reviewed  BASIC METABOLIC PANEL - Abnormal; Notable for the following components:      Result Value   Glucose, Bld 231 (*)    All other components within normal limits  CBC  TROPONIN I (HIGH SENSITIVITY)    EKG EKG Interpretation Date/Time:  Monday December 18 2023 14:13:53 EST Ventricular Rate:  72 PR Interval:  192 QRS Duration:  144 QT Interval:  420 QTC Calculation: 459 R Axis:   155  Text Interpretation: Sinus rhythm with  frequent Premature ventricular complexes Right bundle branch block Left posterior fascicular block Bifascicular block Abnormal ECG No significant change since last tracing Confirmed by Melene Plan [phone]) on 12/18/2023 3:01:19 PM  Radiology DG Chest 2 View Result Date: 12/18/2023 CLINICAL DATA:  Left-sided chest pain and bradycardia EXAM: CHEST - 2  VIEW COMPARISON:  Chest radiograph dated 11/22/2021 FINDINGS: Normal lung volumes. Patchy left basilar opacity. No pleural effusion or pneumothorax. Similar cardiomediastinal silhouette. Similar position of atrial appendage clip. Median sternotomy wires are nondisplaced. IMPRESSION: Patchy left basilar opacity, which may reflect bronchopneumonia. Electronically Signed   By: Agustin Cree M.D.   On: 12/18/2023 16:27    Procedures Procedures    Medications Ordered in ED Medications  sodium chloride 0.9 % bolus 500 mL (0 mLs Intravenous Stopped 12/18/23 1632)    ED Course/ Medical Decision Making/ A&P                                 Medical Decision Making Amount and/or Complexity of Data Reviewed Labs: ordered. Radiology: ordered.  Risk Prescription drug management.   73 yo M with a chief complaints of feeling fatigued this morning.  He says he still feels about the same.  His heart rate was in the 30s on a home monitor called his cardiologist who suggested come here.  Patient has a history of PVCs and is having PVCs frequently on the monitor.  In bigeminy.  Awaiting lab evaluation.  Will give a small bolus of IV fluids.  Chest x-ray independently interpreted by me without focal for pneumothorax.  Hemoglobin is normal.  No leukocytosis.  Radiology read of the chest x-ray with concern for possible pneumonia.  I discussed this with the patient and family.  He feels like he is doing quite a bit better from that standpoint.  I will prescribe him antibiotics in case the patient were to worsen.  He has an appointment with his cardiologist in 48  hours.  4:39 PM:  I have discussed the diagnosis/risks/treatment options with the patient and family.  Evaluation and diagnostic testing in the emergency department does not suggest an emergent condition requiring admission or immediate intervention beyond what has been performed at this time.  They will follow up with PCP, cards. We also discussed returning to the ED immediately if new or worsening sx occur. We discussed the sx which are most concerning (e.g., sudden worsening pain, fever, inability to tolerate by mouth) that necessitate immediate return. Medications administered to the patient during their visit and any new prescriptions provided to the patient are listed below.  Medications given during this visit Medications  sodium chloride 0.9 % bolus 500 mL (0 mLs Intravenous Stopped 12/18/23 1632)     The patient appears reasonably screen and/or stabilized for discharge and I doubt any other medical condition or other [facility] requiring further screening, evaluation, or treatment in the ED at this time prior to discharge.          Final Clinical Impression(s) / ED Diagnoses Final diagnoses:  Other fatigue  Trigeminy    Rx / DC Orders ED Discharge Orders          Ordered    amoxicillin-clavulanate (AUGMENTIN) 875-125 MG tablet  Every 12 hours        12/18/23 1633              Melene Plan, DO 12/18/23 1639

## 2023-12-18 NOTE — Telephone Encounter (Signed)
Spoke with patient and he stated today he felt dizzy, lightheaded,  and a little sluggish. He checked his heart rate with a pulse ox and it was 38. He checked BP while we were on the phone and it was 134/71 with HR 69. He re checked his BP and stated it was 88/91 with HR of 36. Denies any chest pain, SOB, swelling, headache, nausea or vomiting.   Being that his rate is lower than this morning and he is lightheaded and dizzy he will got to ED.

## 2023-12-18 NOTE — ED Notes (Signed)
Pt HR is bigeminy rate 40.... Pt slightly dizzy... Dizziness does not worsen upon moving... Provider informed.Marland KitchenMarland Kitchen

## 2023-12-18 NOTE — ED Triage Notes (Signed)
CP left side. HR in 30's. Recent flu like illness 1 week ago- finish Z pack Saturday.  HX OHS, afib, valve replacement.

## 2023-12-18 NOTE — Telephone Encounter (Signed)
Pt currently in ED at [facility].

## 2023-12-18 NOTE — Telephone Encounter (Signed)
STAT if HR is under 50 or over 120 (normal HR is 60-100 beats per minute)  What is your heart rate? 38-39  Do you have a log of your heart rate readings (document readings)? No  Do you have any other symptoms? Dizziness Call transferred

## 2023-12-18 NOTE — Discharge Instructions (Signed)
Take tylenol 2 pills 4 times a day and motrin 4 pills 3 times a day.  Drink plenty of fluids.  Return for worsening shortness of breath, headache, confusion. Follow up with your family doctor.    Your cxr the radiologist suggested might be consistent with pneumonia.  I am going to continue you on antibiotics.  Please follow-up with your family doctor in the office.

## 2023-12-20 ENCOUNTER — Telehealth: Payer: Self-pay

## 2023-12-20 ENCOUNTER — Encounter: Payer: Self-pay | Admitting: Internal Medicine

## 2023-12-20 ENCOUNTER — Ambulatory Visit: Payer: PPO | Attending: Internal Medicine | Admitting: Internal Medicine

## 2023-12-20 ENCOUNTER — Ambulatory Visit (INDEPENDENT_AMBULATORY_CARE_PROVIDER_SITE_OTHER): Payer: PPO

## 2023-12-20 VITALS — BP 126/82 | HR 78 | Ht 72.0 in | Wt 233.0 lb

## 2023-12-20 DIAGNOSIS — I491 Atrial premature depolarization: Secondary | ICD-10-CM

## 2023-12-20 DIAGNOSIS — G4733 Obstructive sleep apnea (adult) (pediatric): Secondary | ICD-10-CM

## 2023-12-20 DIAGNOSIS — I480 Paroxysmal atrial fibrillation: Secondary | ICD-10-CM | POA: Diagnosis not present

## 2023-12-20 DIAGNOSIS — I4510 Unspecified right bundle-branch block: Secondary | ICD-10-CM

## 2023-12-20 DIAGNOSIS — I493 Ventricular premature depolarization: Secondary | ICD-10-CM

## 2023-12-20 DIAGNOSIS — E669 Obesity, unspecified: Secondary | ICD-10-CM

## 2023-12-20 NOTE — Transitions of Care (Post Inpatient/ED Visit) (Signed)
 12/20/2023  Name: Jeremy Tucker MRN: [id number] DOB: 1951-02-20  Today's TOC FU Call Status: Today's TOC FU Call Status:: Successful TOC FU Call Completed TOC FU Call Complete Date: 12/20/23 Patient's Name and Date of Birth confirmed.  Transition Care Management Follow-up Telephone Call Date of Discharge: 12/18/23 Discharge Facility: [facility] ([facility]) Type of Discharge: Emergency Department Reason for ED Visit: Other: (abn heart beat) How have you been since you were released from the hospital?: Better Any questions or concerns?: No  Items Reviewed: Did you receive and understand the discharge instructions provided?: Yes Medications obtained,verified, and reconciled?: Yes (Medications Reviewed) Any new allergies since your discharge?: No Dietary orders reviewed?: Yes Do you have support at home?: Yes People in Home: spouse  Medications Reviewed Today: Medications Reviewed Today     Reviewed by Emmitt Pan, LPN (Licensed Practical Nurse) on 12/20/23 at 1135  Med List Status: <None>   Medication Order Taking? Sig Documenting Provider Last Dose Status Informant  acetaminophen  (TYLENOL ) 500 MG tablet [id number] No Take 1,000 mg by mouth every 8 (eight) hours as needed for mild pain. [provider] Taking Active Self           Med Note BEVERLEE LUCIENNE JULIANNA Sonjia Sep 05, 2023  1:56 PM) Took 1000mg  this am  albuterol  (VENTOLIN  HFA) 108 (90 Base) MCG/ACT inhaler [id number] No INHALE 2 PUFFS INTO THE LUNGS EVERY 6 HOURS AS NEEDED FOR WHEEZING OR SHORTNESS OF SHERIDA Joyce Norleen JAYSON, MD Taking Active Self           Med Note BEVERLEE LUCIENNE JULIANNA   Tue Sep 05, 2023  1:56 PM) As needed  allopurinol  (ZYLOPRIM ) 300 MG tablet [id number] No Take 1 tablet (300 mg total) by mouth daily. Joyce Norleen JAYSON, MD Taking Active   ammonium lactate (LAC-HYDRIN) 12 % lotion [id number] No Apply 1 application  topically daily as needed for dry skin. [provider] Taking Active Self            Med Note LEONCE, ASHA   Tue Jul 11, 2023 11:16 AM) As needed. Last dose Sunday.   amoxicillin -clavulanate (AUGMENTIN ) 875-125 MG tablet [id number] No Take 1 tablet by mouth every 12 (twelve) hours. Emil Share, DO Taking Active   buPROPion  (WELLBUTRIN  SR) 150 MG 12 hr tablet [id number] No Take 1 tablet (150 mg total) by mouth daily. Joyce Norleen JAYSON, MD Taking Active   ELIQUIS  5 MG TABS tablet [id number] No TAKE 1 TABLET BY MOUTH TWO TIMES A DAY Chandrasekhar, Mahesh A, MD Taking Active   fluticasone  (FLONASE ) 50 MCG/ACT nasal spray [id number] No Place 2 sprays into both nostrils daily. [provider] Taking Active Self  loratadine  (CLARITIN ) 10 MG tablet [id number] No Take 10 mg by mouth in the morning. [provider] Taking Active Self  metoprolol  succinate (TOPROL  XL) 25 MG 24 hr tablet [id number] No Take 1 tablet (25 mg total) by mouth daily. Santo Stanly LABOR, MD Taking Active   Multiple Vitamins-Minerals (MULTIVITAMIN WITH MINERALS) tablet [id number] No Take 1 tablet by mouth daily. [provider] Taking Active Self           Med Note KERRIN, MELISSA R   Mon Dec 13, 2021 12:12 PM)    olopatadine  (PATANOL) 0.1 % ophthalmic solution [id number] No Place 1 drop into both eyes daily as needed for allergies (tearing). Joyce Norleen JAYSON, MD Taking Active   Omega-3 Fatty Acids  (FISH OIL) 1000 MG CAPS [id number] No Take 1,000 mg by mouth daily.  [provider] Taking Active Self           Med Note KERRIN, MELISSA R   Mon Dec 13, 2021 12:12 PM)    omeprazole  (PRILOSEC) 20 MG capsule [id number] No Take 20 mg by mouth daily. [provider] Taking Active Self  OZEMPIC , 2 MG/DOSE, 8 MG/3ML SOPN [id number] No DIAL AND INJECT 2MG  UNDER THE SKIN ONCE WEEKLY Joyce Norleen BROCKS, MD Taking Active   rosuvastatin  (CRESTOR ) 40 MG tablet [id number] No Take 1 tablet (40 mg total) by mouth daily. Joyce Norleen BROCKS, MD Taking Active   Semaglutide , 2 MG/DOSE, (OZEMPIC , 2  MG/DOSE,) 8 MG/3ML SOPN [id number]  Inject 1 Application into the skin once a week. Joyce Norleen BROCKS, MD  Active   sertraline  (ZOLOFT ) 50 MG tablet [id number] No TAKE 1 TABLET BY MOUTH DAILY Joyce Norleen BROCKS, MD Taking Active   sildenafil  (VIAGRA ) 100 MG tablet [id number] No Take 1 tablet (100 mg total) by mouth as needed for erectile dysfunction. Joyce Norleen BROCKS, MD Taking Active   tamsulosin  (FLOMAX ) 0.4 MG CAPS capsule [id number] No Take 0.4 mg by mouth daily. Pt takes 1 tablet daily. [provider] Taking Active   traZODone  (DESYREL ) 50 MG tablet [id number] No TAKE 1/2 TO 1 TABLETS BY MOUTH AS NEEDED FOR SLEEP  Patient not taking: Reported on 08/29/2023   Joyce Norleen BROCKS, MD Not Taking Active Self           Med Note BEVERLEE LUCIENNE JULIANNA Sonjia Sep 05, 2023  1:59 PM) Does not work  zolpidem  (AMBIEN  CR) 6.25 MG CR tablet [id number] No Take 1 tablet (6.25 mg total) by mouth at bedtime as needed for sleep. Joyce Norleen BROCKS, MD Taking Active   Med List Note Kerrin Eleanor SAUNDERS, CPhT 12/13/21 1212): SABRA            Home Care and Equipment/Supplies: Were Home Health Services Ordered?: NA Any new equipment or medical supplies ordered?: NA  Functional Questionnaire: Do you need assistance with bathing/showering or dressing?: No Do you need assistance with meal preparation?: No Do you need assistance with eating?: No Do you have difficulty maintaining continence: No Do you need assistance with getting out of bed/getting out of a chair/moving?: No Do you have difficulty managing or taking your medications?: No  Follow up appointments reviewed: PCP Follow-up appointment confirmed?: NA Specialist Hospital Follow-up appointment confirmed?: Yes Date of Specialist follow-up appointment?: 12/20/23 Follow-Up Specialty Provider:: cardio Do you need transportation to your follow-up appointment?: No Do you understand care options if your condition(s) worsen?: Yes-patient verbalized  understanding    SIGNATURE Julian Lemmings, LPN [facility] Nurse Health Advisor Direct Dial [phone]

## 2023-12-20 NOTE — Progress Notes (Unsigned)
 Enrolled for [employer] to mail a ZIO XT long term holter monitor to the patients address on file.

## 2023-12-20 NOTE — Progress Notes (Signed)
 Cardiology Office Note:  .    Date:  12/20/2023  ID:  SABIN GIBEAULT, DOB 01-02-1951, MRN [id number] PCP: Joyce Norleen BROCKS, MD  [facility] Providers Cardiologist:  Stanly DELENA Leavens, MD Electrophysiologist:  Lynwood Rakers, MD (Inactive)     CC: PVCs  History of Present Illness: .    SANDERS MANNINEN is a 73 y.o. male HTN, HLD with DM, PAC s/p Ablation (2016 [facility]), OSA not on CPAP, AS s/p SAVR and LA appendage ligation (2022).  PERRION DIESEL is a 73 year old male with coronary artery disease who presents with frequent premature ventricular contractions and dizziness. He is accompanied by his wife, Garvin.  He experiences frequent premature ventricular contractions (PVCs) and attributes his symptoms of sluggishness, shortness of breath, and dizziness to these PVCs. His pulse oximeter readings have shown low heart rates, ranging from 37 to 44, due to the PVCs not being counted. He has a history of coronary artery disease and underwent aortic valve replacement in 2022. He was previously on amiodarone  for atrial fibrillation postoperatively after bypass surgery. He was asymptomatic with a 10% PVC burden and normal heart function in the past. He is currently on metoprolol , though the dose is not specified.  He reports recent symptoms of dizziness and shortness of breath, which he associates with a recent diagnosis of pneumonia. He was treated with a Z-Pak for bronchitis and amoxicillin  for pneumonia. He mentions feeling 'a little bit in a fog' since Monday morning, which has improved but persists slightly. He also reports experiencing twinges in his chest.  His past medical history includes a calcium  score of 5000 and a left atrial appendage ligation. He has had a prior ablation in 2016 with Dr. Rakers.  Relevant histories: .  2023: needed amiodarone  for return of AF.  Following with Dr. Claudene. 2024: Stable labs.  Established care with me. 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   Studies Reviewed: .   Cardiac Studies & Procedures   CARDIAC CATHETERIZATION  CARDIAC CATHETERIZATION 08/04/2021  Narrative CONCLUSIONS: Normal left main 40% proximal RCA stenosis.  Right dominant anatomy. Widely patent circumflex. 20 to 30% mid LAD.  The LAD reaches the left ventricular apex. Normal pulmonary artery pressures, mean 23 mmHg. Pulmonary capillary wedge pressure mean, 13 mmHg.  RECOMMENDATIONS: Referred to the aortic valve clinic for consideration of TAVR versus SAVR  Findings Coronary Findings Diagnostic  Dominance: Right  Left Anterior Descending Prox LAD to Mid LAD lesion is 25% stenosed.  Right Coronary Artery Prox RCA lesion is 40% stenosed.  Intervention  No interventions have been documented.   CARDIAC CATHETERIZATION  CARDIAC CATHETERIZATION 09/19/2018  Narrative  Right dominant coronary anatomy.  Normal left main.  Normal LAD.  Normal circumflex giving origin to 4 obtuse marginals with the second marginal being a large vessel supplying much of the lateral wall.  RCA contains eccentric proximal 30% narrowing.  Otherwise widely patent without any significant obstructive disease.  Heavily calcified aortic valve with restricted motion.  Transvalvular peak to peak gradient 33 mmHg; mean gradient 30 mmHg.  Normal left ventricular LVEDP, 15 mmHg.  Atypical episodes of chest pain occurring at rest lasting up to 30 to 40 minutes, characterized as severe and in the left pectoral region.  Suspect neurogenic/musculoskeletal.  Rule out aortic related.  RECOMMENDATIONS:   CT aortic angiography to rule out aneurysm/dissection.  We will set up to be done as an  outpatient.  Continue aerobic activity.  Resume apixaban  a.m. 09/20/2018.  No indication for antiplatelet therapy at this time.  Findings Coronary Findings Diagnostic  Dominance: Right  Right Coronary Artery Prox RCA lesion is 30%  stenosed.  Intervention  No interventions have been documented.   STRESS TESTS  MYOCARDIAL PERFUSION IMAGING 04/26/2017  Narrative  Nuclear stress EF: 53%.  Blood pressure demonstrated a normal response to exercise.  There was no ST segment deviation noted during stress.  No T wave inversion was noted during stress.  The left ventricular ejection fraction is mildly decreased (45-54%).  The study is normal.  This is a low risk study.  ECHOCARDIOGRAM  ECHOCARDIOGRAM COMPLETE 06/13/2023  Narrative ECHOCARDIOGRAM REPORT    Patient Name:   DOUGLAS SMOLINSKY Date of Exam: 06/13/2023 Medical Rec #:  [id number]     Height:       72.0 in Accession #:    [id number]    Weight:       225.0 lb Date of Birth:  [birth year]     BSA:          2.240 m Patient Age:    73 years      BP:           130/78 mmHg Patient Gender: M             HR:           66 bpm. Exam Location:  [address]  Procedure: 2D Echo, Cardiac Doppler and Color Doppler  Indications:    I49.3 PVC  History:        Patient has prior history of Echocardiogram examinations, most recent 11/02/2021. Obesity, S/p AVR (25mm Inspiris Resilia), Arrythmias:PVC, Atrial Fibrillation and RBBB; Risk Factors:Hypertension, Diabetes and Dyslipidemia. Aortic Valve: 25 mm pericardial valve is present in the aortic position. Procedure Date: 09/20/2021.  Sonographer:    Elsie Bohr RDCS Referring Phys: [id number] [facility] A Cincere Zorn  IMPRESSIONS   1. Left ventricular ejection fraction, by estimation, is 60 to 65%. The left ventricle has normal function. The left ventricle has no regional wall motion abnormalities. There is moderate concentric left ventricular hypertrophy. Left ventricular diastolic parameters are consistent with Grade II diastolic dysfunction (pseudonormalization). 2. Right ventricular systolic function is normal. The right ventricular size is normal. There is normal pulmonary artery systolic pressure. The  estimated right ventricular systolic pressure is 28.0 mmHg. 3. Left atrial size was mildly dilated. 4. The mitral valve is grossly normal. Mild mitral valve regurgitation. No evidence of mitral stenosis. 5. The aortic valve has been repaired/replaced. Aortic valve regurgitation is not visualized. There is a 25 mm pericardial valve present in the aortic position. Procedure Date: 09/20/2021. Echo findings are consistent with normal structure and function of the aortic valve prosthesis. Aortic valve area, by VTI measures 1.38 cm. Aortic valve mean gradient measures 11.0 mmHg. Aortic valve Vmax measures 2.30 m/s. 6. The inferior vena cava is normal in size with greater than 50% respiratory variability, suggesting right atrial pressure of 3 mmHg.  Comparison(s): No significant change from prior study.  FINDINGS Left Ventricle: Left ventricular ejection fraction, by estimation, is 60 to 65%. The left ventricle has normal function. The left ventricle has no regional wall motion abnormalities. The left ventricular internal cavity size was normal in size. There is moderate concentric left ventricular hypertrophy. Abnormal (paradoxical) septal motion consistent with post-operative status. Left ventricular diastolic parameters are consistent with Grade II diastolic dysfunction (pseudonormalization).  Right Ventricle: The right ventricular size is  normal. No increase in right ventricular wall thickness. Right ventricular systolic function is normal. There is normal pulmonary artery systolic pressure. The tricuspid regurgitant velocity is 2.50 m/s, and with an assumed right atrial pressure of 3 mmHg, the estimated right ventricular systolic pressure is 28.0 mmHg.  Left Atrium: Left atrial size was mildly dilated.  Right Atrium: Right atrial size was normal in size.  Pericardium: There is no evidence of pericardial effusion.  Mitral Valve: The mitral valve is grossly normal. Mild mitral valve regurgitation.  No evidence of mitral valve stenosis.  Tricuspid Valve: The tricuspid valve is grossly normal. Tricuspid valve regurgitation is trivial. No evidence of tricuspid stenosis.  Aortic Valve: The aortic valve has been repaired/replaced. Aortic valve regurgitation is not visualized. Aortic valve mean gradient measures 11.0 mmHg. Aortic valve peak gradient measures 21.1 mmHg. Aortic valve area, by VTI measures 1.38 cm. There is a 25 mm pericardial valve present in the aortic position. Procedure Date: 09/20/2021. Echo findings are consistent with normal structure and function of the aortic valve prosthesis.  Pulmonic Valve: The pulmonic valve was grossly normal. Pulmonic valve regurgitation is trivial. No evidence of pulmonic stenosis.  Aorta: The aortic root and ascending aorta are structurally normal, with no evidence of dilitation.  Venous: The right lower pulmonary vein is normal. The inferior vena cava is normal in size with greater than 50% respiratory variability, suggesting right atrial pressure of 3 mmHg.  IAS/Shunts: The atrial septum is grossly normal.   LEFT VENTRICLE PLAX 2D LVIDd:         4.50 cm   Diastology LVIDs:         3.20 cm   LV e' medial:    5.44 cm/s LV PW:         1.50 cm   LV E/e' medial:  24.1 LV IVS:        1.40 cm   LV e' lateral:   8.78 cm/s LVOT diam:     2.10 cm   LV E/e' lateral: 14.9 LV SV:         66 LV SV Index:   30 LVOT Area:     3.46 cm   RIGHT VENTRICLE            IVC RV S prime:     8.12 cm/s  IVC diam: 1.50 cm TAPSE (M-mode): 1.4 cm RVSP:           28.0 mmHg  LEFT ATRIUM             Index        RIGHT ATRIUM           Index LA diam:        5.50 cm 2.46 cm/m   RA Pressure: 3.00 mmHg LA Vol (A2C):   60.6 ml 27.05 ml/m  RA Area:     13.80 cm LA Vol (A4C):   83.6 ml 37.32 ml/m  RA Volume:   33.70 ml  15.05 ml/m LA Biplane Vol: 77.8 ml 34.73 ml/m AORTIC VALVE AV Area (Vmax):    1.29 cm AV Area (Vmean):   1.31 cm AV Area (VTI):     1.38  cm AV Vmax:           229.60 cm/s AV Vmean:          153.600 cm/s AV VTI:            0.482 m AV Peak Grad:      21.1 mmHg AV Mean Grad:  11.0 mmHg LVOT Vmax:         85.72 cm/s LVOT Vmean:        58.100 cm/s LVOT VTI:          0.192 m LVOT/AV VTI ratio: 0.40  AORTA Ao Root diam: 3.20 cm Ao Asc diam:  3.70 cm  MITRAL VALVE                TRICUSPID VALVE MV Area (PHT): 3.46 cm     TR Peak grad:   25.0 mmHg MV Decel Time: 219 msec     TR Vmax:        250.00 cm/s MV E velocity: 131.20 cm/s  Estimated RAP:  3.00 mmHg MV A velocity: 74.42 cm/s   RVSP:           28.0 mmHg MV E/A ratio:  1.76 SHUNTS Systemic VTI:  0.19 m Systemic Diam: 2.10 cm  Darryle Decent MD Electronically signed by Darryle Decent MD Signature Date/Time: 06/13/2023/9:45:10 AM    Final  TEE  ECHO INTRAOPERATIVE TEE 09/20/2021  Narrative *INTRAOPERATIVE TRANSESOPHAGEAL REPORT *    Patient Name:   GORDY GORMAN NIDA  Date of Exam: 09/20/2021 Medical Rec #:  [id number]      Height:       72.0 in Accession #:    [id number]     Weight:       231.0 lb Date of Birth:  09/12/1951      BSA:          2.27 m Patient Age:    70 years       BP:           163/76 mmHg Patient Gender: M              HR:           65 bpm. Exam Location:  Anesthesiology  Transesophogeal exam was perform intraoperatively during surgical procedure. Patient was closely monitored under general anesthesia during the entirety of examination.  Indications:     Aortic valve disorder Sonographer:     Lyle Marc RDCS Performing Phys: [id number] DORISE MARLA FELLERS Diagnosing Phys: Lonni Custard MD  Complications: No known complications during this procedure. POST-OP IMPRESSIONS _ Left Ventricle: has normal systolic function, with an ejection fraction of 65%. The cavity size was normal. The wall motion is normal. _ Right Ventricle: normal function. The wall motion is normal. _ Aorta: there is no dissection present in the aorta. _ Aortic  Valve: No stenosis present. A bovine bioprosthetic valve was placed, leaflets are freely mobile and leaflets thin. There is no regurgitation. No regurgitation post repair. The gradient recorded across the prosthetic valve is within the expected range. No perivalvular leak noted. _ Tricuspid Valve: There is mild regurgitation.  PRE-OP FINDINGS Left Ventricle: The left ventricle has normal systolic function, with an ejection fraction of 60-65%. The cavity size was normal. No evidence of left ventricular regional wall motion abnormalities. There is severe concentric left ventricular hypertrophy.   Right Ventricle: The right ventricle has normal systolic function. The cavity was normal. There is right vetricular wall thickness was not assessed.  Left Atrium: No left atrial/left atrial appendage thrombus was detected.  Interatrial Septum: No atrial level shunt detected by color flow Doppler. There is no evidence of a patent foramen ovale.  Pericardium: There is no evidence of pericardial effusion.  Mitral Valve: The mitral valve is normal in structure. Mitral valve regurgitation is mild by color flow Doppler. There is  No evidence of mitral stenosis.  Tricuspid Valve: The tricuspid valve was normal in structure. Tricuspid valve regurgitation is trivial by color flow Doppler. No evidence of tricuspid stenosis is present.  Aortic Valve: The aortic valve is tricuspid Aortic valve regurgitation is mild by color flow Doppler. The jet is centrally-directed. There is severe stenosis of the aortic valve, with a calculated valve area of 0.95 cm. There is no evidence of aortic valve vegetation. There is severe thickening and severe calcifcation present on the aortic valve right coronary and non-coronary cusps with moderately decreased mobility and there is mild thickening and mild calcification present on the aortic valve left coronary cusp with mildly decreased mobility.  Pulmonic Valve: The pulmonic  valve was not assessed. Pulmonic valve regurgitation was not assessed by color flow Doppler.   Aorta: There is evidence of a dissection in the none.  +--------------+--------++ LEFT VENTRICLE         +--------------+--------++ PLAX 2D                +--------------+--------++ LVOT diam:    2.20 cm  +--------------+--------++ LVOT Area:    3.80 cm +--------------+--------++                        +--------------+--------++  +------------------+------------++ AORTIC VALVE                   +------------------+------------++ AV Area (Vmax):   1.38 cm     +------------------+------------++ AV Area (Vmean):  1.24 cm     +------------------+------------++ AV Area (VTI):    0.95 cm     +------------------+------------++ AV Vmax:          322.00 cm/s  +------------------+------------++ AV Vmean:         230.500 cm/s +------------------+------------++ AV VTI:           0.804 m      +------------------+------------++ AV Peak Grad:     41.5 mmHg    +------------------+------------++ AV Mean Grad:     49.0 mmHg    +------------------+------------++ LVOT Vmax:        117.00 cm/s  +------------------+------------++ LVOT Vmean:       75.300 cm/s  +------------------+------------++ LVOT VTI:         0.201 m      +------------------+------------++ LVOT/AV VTI ratio:0.25         +------------------+------------++   +--------------+-------+ SHUNTS                +--------------+-------+ Systemic VTI: 0.20 m  +--------------+-------+ Systemic Diam:2.20 cm +--------------+-------+   Lonni Custard MD Electronically signed by Lonni Custard MD Signature Date/Time: 10/11/2021/12:25:18 PM    Final  MONITORS  LONG TERM MONITOR (3-14 DAYS) 05/31/2023  Narrative   Patient had a minimum heart rate of 48 bpm, maximum heart rate of 144 bpm, and average heart rate of 70 bpm.   Predominant  underlying rhythm was sinus rhythm.   Occasional runs of SVT, 16 seconds at longest.   Isolated PACs were frequent (5%).   Isolated PVCs were frequent (10%).   Triggered and diary events associated with sinus rhythm; PVCS.  Asymptomatic SVT.  Frequent PACs and PVCs.  CT SCANS  CT CORONARY MORPH W/CTA COR W/SCORE 08/25/2021  Addendum 08/25/2021  4:41 PM ADDENDUM REPORT: 08/25/2021 16:38  ADDENDUM: Please see separate dictation for contemporaneously obtained CTA chest, abdomen and pelvis dated 08/25/2021 for full description of relevant extracardiac findings.   Electronically Signed By: Toribio Aye M.D. On: 08/25/2021  16:38  Narrative CLINICAL DATA:  Aortic Stenosis  EXAM: Cardiac TAVR CT  TECHNIQUE: The patient was scanned on a Siemens Force 192 slice scanner. A 120 kV retrospective scan was triggered in the ascending thoracic aorta at 140 HU's. Gantry rotation speed was 250 msecs and collimation was .6 mm. No beta blockade or nitro were given. The 3D data set was reconstructed in 5% intervals of the R-R cycle. Systolic and diastolic phases were analyzed on a dedicated work station using MPR, MIP and VRT modes. The patient received 80 cc of contrast.  FINDINGS: Aortic Valve: Tri leaflet AV heavily calcified with score 5130  Aorta: No aneurysm Bovine Arch mild calcific atherosclerosis  Sino-tubular Junction: 28 mm  Ascending Thoracic Aorta: 35 mm  Aortic Arch: 29 mm  Descending Thoracic Aorta: 26 mm  Sinus of Valsalva Measurements:  Non-coronary: 33.7 mm  Right - coronary: 30.4 mm sinus height 17.7 mm  Left -   coronary: 30.7 mm sinus height 21.2 mm  Coronary Artery Height above Annulus:  Left Main: 13.2 mm above annulus  Right Coronary: 14.1 mm above annulus  Virtual Basal Annulus Measurements:  Maximum / Minimum Diameter: 32.1 mm x 25.9 mm  Perimeter: 94.1 mm  Area: 669 mm2  Coronary Arteries: Sufficient height above annulus for  deployment  Optimum Fluoroscopic Angle for Delivery: LAO 14 Caudal 14 degrees  IMPRESSION: 1. Tri-leaflet AV with calcium  score 5130  2. Optimum angiographic angle for deployment LAO 14 Caudal 14 degrees  3.  Annular area of 669 mm2 upper limit of 29 mm Sapien 3 valve  4. Concern for marked annular and subvalvular calcium  extending across inter-valvular fibrosa to mid anterior mitral leaflet  5.  Coronary arteries sufficient height above annulus for deployment  Maude Emmer  Electronically Signed: By: Maude Emmer M.D. On: 08/25/2021 15:21           Physical Exam:    VS:  BP 126/82   Pulse 78   Ht 6' (1.829 m)   Wt 233 lb (105.7 kg)   BMI 31.60 kg/m    Wt Readings from Last 3 Encounters:  12/20/23 233 lb (105.7 kg)  09/05/23 226 lb 9.6 oz (102.8 kg)  07/11/23 216 lb (98 kg)    Gen: No distress   Neck: No JVD Cardiac: No Rubs or Gallops, systolic murmur but distant heart sounds, +2 radial pulses, IRIR Respiratory: Clear to auscultation bilaterally, normal effort, normal  respiratory rate GI: Soft, nontender, non-distended  MS: No  edema;  moves all extremities Integument: Skin feels warm Neuro:  At time of evaluation, alert and oriented to person Psych: Normal affect, patient feels well   ASSESSMENT AND PLAN: .    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 Devra presents with frequent PVCs, dizziness, and shortness of breath. He has coronary artery disease and underwent aortic valve replacement in 2022. Recent EKG shows frequent PVCs, but heart rate and blood pressure are well-controlled. PVCs are not causing significant heart dysfunction; heart function was normal last year with a 10% PVC burden. Symptoms are likely due to pneumonia rather than PVCs. Discussed potential need for more aggressive treatment if symptoms persist post-pneumonia treatment, including increasing metoprolol  and considering  ablation. Explained that most people with outflow tract PVCs do well and that ablation is an option if symptomatic PVCs persist. - Repeat heart monitor for one week - If PVC burden has increased or if there is evidence of non-sustained ventricular  tachycardia, increase metoprolol  to 37.5 mg - Refer to electrophysiology (EP) for further evaluation and potential ablation if symptomatic PVCs persist - If symptoms persist despite treatment for pneumonia, repeat echocardiogram  Pneumonia Mr. Devra was recently diagnosed with pneumonia, likely contributing to his dizziness, shortness of breath, and fatigue. Treated with a Z-Pak and amoxicillin . Pneumonia is the primary suspected cause of his current symptoms rather than his heart condition.  Managed by PCP and ED  Atrial Fibrillation (AF) Mr. Devra has atrial fibrillation and previously underwent left atrial appendage ligation. He was on amiodarone  postoperatively but is not currently on it. Long-term amiodarone  is avoided due to its side effects. Discussed the possibility of reinitiating amiodarone  if other treatments fail. 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 Devra has coronary artery disease with a calcium  score of 5000. He underwent aortic valve replacement in 2022. His coronary arteries were not bypassed during the surgery, indicating mild non-obstructive disease. No acute coronary event is suspected at this time. - rare chest twinge is likely not anginal equivalent - continue rosuvastatin    OSA Obesity - CPAP is recommended - ozempic  has worked well for him  HTN - continue current therapy   Stanly Leavens, MD FASE [facility] Cardiologist [facility]  [address] [place], [place] [address] [phone]  10:02 AM

## 2023-12-20 NOTE — Patient Instructions (Signed)
 Medication Instructions:  Your physician recommends that you continue on your current medications as directed. Please refer to the Current Medication list given to you today.  *If you need a refill on your cardiac medications before your next appointment, please call your pharmacy*   Lab Work: NONE  If you have labs (blood work) drawn today and your tests are completely normal, you will receive your results only by: MyChart Message (if you have MyChart) OR A paper copy in the mail If you have any lab test that is abnormal or we need to change your treatment, we will call you to review the results.   Testing/Procedures: Your physician has requested that you wear a heart monitor.    Follow-Up: At [facility] you and your health needs are our priority.  As part of our continuing mission to provide you with exceptional heart care, we have created designated Provider Care Teams.  These Care Teams include your primary Cardiologist (physician) and Advanced Practice Providers (APPs -  Physician Assistants and Nurse Practitioners) who all work together to provide you with the care you need, when you need it.    Your next appointment:   3 month(s)  Provider:   Orren Fabry, PA-C, Dayna Dunn, PA-C, Jackee Alberts, NP, Olivia Pavy, PA-C, Rosaline Bane, NP, Glendia Ferrier, PA-C, or Artist Pouch, PA-C        Other Instructions Jeremy Tucker- Long Term Monitor Instructions  Your physician has requested you wear a ZIO patch monitor for 7 days.  This is a single patch monitor. [employer] supplies one patch monitor per enrollment. Additional stickers are not available. Please do not apply patch if you will be having a Nuclear Stress Test,   Cardiac CT, MRI, or Chest Xray during the period you would be wearing the  monitor. The patch cannot be worn during these tests. You cannot remove and re-apply the  ZIO XT patch monitor.  Your ZIO patch monitor will be mailed 3 day USPS to your address on  file. It may take 3-5 days  to receive your monitor after you have been enrolled.  Once you have received your monitor, please review the enclosed instructions. Your monitor  has already been registered assigning a specific monitor serial # to you.  Billing and Patient Assistance Program Information  We have supplied [employer] with any of your insurance information on file for billing purposes. [employer] offers a sliding scale Patient Assistance Program for patients that do not have  insurance, or whose insurance does not completely cover the cost of the ZIO monitor.  You must apply for the Patient Assistance Program to qualify for this discounted rate.  To apply, please call [employer] at [phone], select option 4, select option 2, ask to apply for  Patient Assistance Program. Meredeth will ask your household income, and how many people  are in your household. They will quote your out-of-pocket cost based on that information.  [employer] will also be able to set up a 50-month, interest-free payment plan if needed.  Applying the monitor   Shave hair from upper left chest.  Hold abrader disc by orange tab. Rub abrader in 40 strokes over the upper left chest as  indicated in your monitor instructions.  Clean area with 4 enclosed alcohol pads. Let dry.  Apply patch as indicated in monitor instructions. Patch will be placed under collarbone on left  side of chest with arrow pointing upward.  Rub patch adhesive wings for 2 minutes. Remove white label  marked 1. Remove the white  label marked 2. Rub patch adhesive wings for 2 additional minutes.  While looking in a mirror, press and release button in center of patch. A small green light will  flash 3-4 times. This will be your only indicator that the monitor has been turned on.  Do not shower for the first 24 hours. You may shower after the first 24 hours.  Press the button if you feel a symptom. You will hear a small click. Record Date, Time and   Symptom in the Patient Logbook.  When you are ready to remove the patch, follow instructions on the last 2 pages of Patient  Logbook. Stick patch monitor onto the last page of Patient Logbook.  Place Patient Logbook in the blue and white box. Use locking tab on box and tape box closed  securely. The blue and white box has prepaid postage on it. Please place it in the mailbox as  soon as possible. Your physician should have your test results approximately 7 days after the  monitor has been mailed back to [facility].  Call [facility] Customer Care at [phone] if you have questions regarding  your ZIO XT patch monitor. Call them immediately if you see an orange light blinking on your  monitor.  If your monitor falls off in less than 4 days, contact our Monitor department at [phone].  If your monitor becomes loose or falls off after 4 days call [employer] at [phone] for  suggestions on securing your monitor

## 2023-12-25 DIAGNOSIS — I493 Ventricular premature depolarization: Secondary | ICD-10-CM | POA: Diagnosis not present

## 2023-12-25 DIAGNOSIS — I480 Paroxysmal atrial fibrillation: Secondary | ICD-10-CM

## 2024-01-01 DIAGNOSIS — N200 Calculus of kidney: Secondary | ICD-10-CM | POA: Diagnosis not present

## 2024-01-08 DIAGNOSIS — I493 Ventricular premature depolarization: Secondary | ICD-10-CM | POA: Diagnosis not present

## 2024-01-08 DIAGNOSIS — I480 Paroxysmal atrial fibrillation: Secondary | ICD-10-CM | POA: Diagnosis not present

## 2024-01-09 ENCOUNTER — Encounter: Payer: Self-pay | Admitting: Internal Medicine

## 2024-01-12 ENCOUNTER — Encounter: Payer: Self-pay | Admitting: Internal Medicine

## 2024-01-12 DIAGNOSIS — I4729 Other ventricular tachycardia: Secondary | ICD-10-CM

## 2024-01-12 DIAGNOSIS — R0602 Shortness of breath: Secondary | ICD-10-CM

## 2024-01-12 DIAGNOSIS — I493 Ventricular premature depolarization: Secondary | ICD-10-CM

## 2024-01-17 MED ORDER — METOPROLOL SUCCINATE ER 25 MG PO TB24: 37.5000 mg | ORAL_TABLET | Freq: Every day | ORAL | 3 refills | Status: DC

## 2024-01-17 NOTE — Telephone Encounter (Signed)
 The patient has been notified of the result and verbalized understanding.  All questions (if any) were answered. Macie Burows, RN 01/17/2024 4:52 PM   Pt is agreeable to MD recommendations. Reports continued SOB will order echocardiogram.

## 2024-01-17 NOTE — Telephone Encounter (Signed)
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

## 2024-01-18 ENCOUNTER — Telehealth: Payer: Self-pay | Admitting: Internal Medicine

## 2024-01-18 NOTE — Telephone Encounter (Signed)
 Spoke with per DPR and she states with recent episodes that patient has been having with his heart they would like know if they should cancel their trp. They will be flying 10 hours to [place] and then going on a cruise. She wanted me to ask you "If it were you what would you do?"   They do not mind canceling but they don't want to wait until the last minute

## 2024-01-18 NOTE — Telephone Encounter (Signed)
 Patient is requesting to speak with RN Shamea in regard to his previous conversation with her.

## 2024-01-18 NOTE — Telephone Encounter (Signed)
 Wife Starleen Blue) stated patient is scheduled to go on a trip starting April 30th and will be flying for 10 hours prior to going on a cruise.  Wife wants to know if it will be safe for patient to make this trip or should patient cancel.

## 2024-01-19 NOTE — Telephone Encounter (Signed)
 Called spoke with pt and spouse please see telephone encounter for more details.

## 2024-01-19 NOTE — Telephone Encounter (Signed)
 Called spoke with spouse and pt.  Advised of MD response.  Pt has started increased dose of metoprolol about 2 days ago.  Pt reports has dizziness on average around twice daily.  Advised pt this could be r/t funny heart beats will see if med change helps with this.  Pt expresses will cancel trip.  Would like to be called if there is a cancellation for echo; I have sent a message to Misty Stanley to call pt if has a cancellation.

## 2024-01-22 ENCOUNTER — Telehealth: Payer: Self-pay | Admitting: Internal Medicine

## 2024-01-22 NOTE — Telephone Encounter (Signed)
 Patient dtopping off form to be signed by Dr. Salena Saner ASAP for his Cruise giving from [employer]. Form is at front desk!!

## 2024-01-22 NOTE — Telephone Encounter (Signed)
 Called pt advised paperwork is ready for pick up.  Pt asked if there were any cancellations for echo.  Is willing to see first available.  Advised of opening on 01/26/24 at 11:20 am.  Pt is agreeable to this appointment time although will not be performed by Will.   Paperwork placed at front desk for pick up.

## 2024-01-26 ENCOUNTER — Encounter (HOSPITAL_COMMUNITY): Payer: Self-pay

## 2024-01-26 ENCOUNTER — Ambulatory Visit (HOSPITAL_COMMUNITY): Attending: Cardiovascular Disease

## 2024-01-26 DIAGNOSIS — I4729 Other ventricular tachycardia: Secondary | ICD-10-CM | POA: Diagnosis not present

## 2024-01-26 DIAGNOSIS — I340 Nonrheumatic mitral (valve) insufficiency: Secondary | ICD-10-CM | POA: Diagnosis not present

## 2024-01-26 DIAGNOSIS — I361 Nonrheumatic tricuspid (valve) insufficiency: Secondary | ICD-10-CM | POA: Diagnosis not present

## 2024-01-26 DIAGNOSIS — I493 Ventricular premature depolarization: Secondary | ICD-10-CM | POA: Diagnosis not present

## 2024-01-26 DIAGNOSIS — R0602 Shortness of breath: Secondary | ICD-10-CM | POA: Diagnosis not present

## 2024-01-26 LAB — ECHOCARDIOGRAM COMPLETE
AR max vel: 1.45 cm2
AV Area VTI: 1.44 cm2
AV Area mean vel: 1.5 cm2
AV Mean grad: 14 mmHg
AV Peak grad: 26 mmHg
Ao pk vel: 2.55 m/s
Area-P 1/2: 3.37 cm2
MV M vel: 6.29 m/s
MV Peak grad: 158.3 mmHg
S' Lateral: 3.6 cm

## 2024-01-28 ENCOUNTER — Encounter: Payer: Self-pay | Admitting: Internal Medicine

## 2024-01-30 MED ORDER — AMIODARONE HCL 200 MG PO TABS: 400.0000 mg | ORAL_TABLET | Freq: Two times a day (BID) | ORAL | 0 refills | Status: DC

## 2024-01-30 MED ORDER — METOPROLOL SUCCINATE ER 25 MG PO TB24: 25.0000 mg | ORAL_TABLET | Freq: Every day | ORAL | 3 refills | Status: DC

## 2024-01-30 NOTE — Telephone Encounter (Signed)
 The patient has been notified of the result and verbalized understanding.  All questions (if any) were answered. Macie Burows, RN 01/30/2024 3:49 PM

## 2024-01-30 NOTE — Telephone Encounter (Signed)
-----   Message from Christell Constant sent at 01/28/2024  2:42 PM EDT ----- Results: Normal function Mild to moderate MR Dizziness with increased dose of metoprolol  Plan: Return to original metoprolol dose Amiodarone 400 mg PO BID for 10 days, seeing Dr. Elberta Fortis 02/07/24 Echo in one year When I return to the office, I plan to review the paperwork for his cruise  Christell Constant, MD

## 2024-02-01 ENCOUNTER — Encounter: Payer: Self-pay | Admitting: Cardiology

## 2024-02-01 ENCOUNTER — Ambulatory Visit: Attending: Cardiology | Admitting: Cardiology

## 2024-02-01 VITALS — BP 110/78 | HR 71 | Ht 72.0 in | Wt 230.8 lb

## 2024-02-01 DIAGNOSIS — I2510 Atherosclerotic heart disease of native coronary artery without angina pectoris: Secondary | ICD-10-CM | POA: Diagnosis not present

## 2024-02-01 DIAGNOSIS — Z952 Presence of prosthetic heart valve: Secondary | ICD-10-CM | POA: Diagnosis not present

## 2024-02-01 DIAGNOSIS — I10 Essential (primary) hypertension: Secondary | ICD-10-CM

## 2024-02-01 DIAGNOSIS — I493 Ventricular premature depolarization: Secondary | ICD-10-CM

## 2024-02-01 NOTE — Progress Notes (Signed)
  Electrophysiology Office Note:   Date:  02/01/2024  ID:  Jeremy Tucker, DOB [birth year], MRN [id number]  Primary Cardiologist: Christell Constant, MD Primary Heart Failure: None Electrophysiologist: Hillis Range, MD (Inactive)      History of Present Illness:   Jeremy Tucker is a 73 y.o. male with h/o hypertension, hyperlipidemia, diabetes, PACs post ablation in 2016, sleep apnea, aortic stenosis with left atrial appendage ligation seen today for  for Electrophysiology evaluation of PVCs at the request of Mahesh Chandrashekar.    He has frequent PVCs.  He has symptoms of sluggishness and shortness of breath.  His heart rates have been in the 30s to 40s.  He also has coronary artery disease and underwent aortic valve replacement in 01/03/2021.  He is on amiodarone previously for atrial fibrillation after his bypass operation.  He initially had a 10% PVC burden, but recent monitor shows a burden of 26%.  He feels significant fatigue and shortness of breath at times.  This occurs when he has both at rest and exercising.  He walks quite a bit with his wife.  Review of systems complete and found to be negative unless listed in HPI.   EP Information / Studies Reviewed:    EKG is not ordered today. EKG from 12/18/23 reviewed which showed sinus rhythm with PVCs        Risk Assessment/Calculations:             Physical Exam:   VS:  Ht 6' (1.829 m)   Wt 230 lb 12.8 oz (104.7 kg)   BMI 31.30 kg/m    Wt Readings from Last 3 Encounters:  02/01/24 230 lb 12.8 oz (104.7 kg)  12/20/23 233 lb (105.7 kg)  09/05/23 226 lb 9.6 oz (102.8 kg)     GEN: Well nourished, well developed in no acute distress NECK: No JVD; No carotid bruits CARDIAC: Regular rate and rhythm with occasional ectopy, no murmurs, rubs, gallops RESPIRATORY:  Clear to auscultation without rales, wheezing or rhonchi  ABDOMEN: Soft, non-tender, non-distended EXTREMITIES:  No edema; No deformity   ASSESSMENT AND PLAN:     1.  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  We discussed suppression of PVCs with ablation or medications.  He has a prosthetic aortic valve, and thus ablation in the left ventricle would need to be transseptal.  The patient understands this and would prefer to avoid medications if possible.  Due to that, we Blyss Lugar plan for ablation.  Risk and benefits have been discussed.  Risk include bleeding, tamponade, heart block, stroke, damage to chest organs, MI, renal failure, death.  The patient understands the risks and is agreed to the procedure.  2.  Atrial fibrillation: This post left atrial appendage ligation and has not anticoagulated.  He was on amiodarone postop, but this has since been stopped.  3.  Aortic valve replacement: Stable.  Plan per primary cardiology.  4.  Coronary artery disease: Has elevated calcium score of 5000.  Coronary is not bypassed during surgery indicating nonobstructive disease.  No current chest pain.  No changes.  5.  Obstructive sleep apnea: CPAP recommended  6.  Hypertension: Currently well-controlled  Case discussed with primary cardiology  Follow up with Dr. Elberta Fortis as usual post procedure  Signed, Jamale Spangler Jorja Loa, MD

## 2024-02-01 NOTE — Patient Instructions (Addendum)
 Medication Instructions:  Your physician has recommended you make the following change in your medication:  DO NOT start Amiodarone  *If you need a refill on your cardiac medications before your next appointment, please call your pharmacy*   Lab Work: None ordered  If you have any lab test that is abnormal or we need to change your treatment, we will call you to review the results.   Testing/Procedures: Your physician has recommended that you have an ablation. Catheter ablation is a medical procedure used to treat some cardiac arrhythmias (irregular heartbeats). During catheter ablation, a long, thin, flexible tube is put into a blood vessel in your groin (upper thigh), or neck. This tube is called an ablation catheter. It is then guided to your heart through the blood vessel. Radio frequency waves destroy small areas of heart tissue where abnormal heartbeats may cause an arrhythmia to start.   Your procedure is scheduled for 04/11/2024.  You will arrive to [facility] at 5:30 am.  We will be in touch at a later date to go over procedure instructions.   Follow-Up: At [facility], you and your health needs are our priority.  As part of our continuing mission to provide you with exceptional heart care, we have created designated Provider Care Teams.  These Care Teams include your primary Cardiologist (physician) and Advanced Practice Providers (APPs -  Physician Assistants and Nurse Practitioners) who all work together to provide you with the care you need, when you need it.  Your next appointment:   1 month(s)  The format for your next appointment:   In Person  Provider:   You may see Will Jorja Loa, MD or one of the following Advanced Practice Providers on your designated Care Team:   Francis Dowse, [place] [address] [place], [place] Sherie Don, NP Canary Brim, NP{   Thank you for choosing [facility]!!   Dory Horn, RN [phone]  Other  Instructions  Cardiac Ablation Cardiac ablation is a procedure to destroy (ablate) heart tissue that is sending bad signals. These bad signals cause the heart to beat very fast or in a way that is not normal. Destroying some tissues can help make the heart rhythm normal. Tell your doctor about: Any allergies you have. All medicines you are taking. These include vitamins, herbs, eye drops, creams, and over-the-counter medicines. Any problems you or family members have had with anesthesia. Any bleeding problems you have. Any surgeries you have had. Any medical conditions you have. Whether you are pregnant or may be pregnant. What are the risks? Your doctor will talk with you about risks. These may include: Infection. Bruising and bleeding. Stroke or blood clots. Damage to nearby areas of your body. Allergies to medicines or dyes. Needing a pacemaker if the heart gets damaged. A pacemaker helps the heart beat normally. The procedure not working. What happens before the procedure? Medicines Ask your doctor about changing or stopping: Your normal medicines. Vitamins, herbs, and supplements. Over-the-counter medicines. Do not take aspirin or ibuprofen unless you are told to. General instructions Follow instructions from your doctor about what you may eat and drink. If you will be going home right after the procedure, plan to have a responsible adult: Take you home from the hospital or clinic. You will not be allowed to drive. Care for you for the time you are told. Ask your doctor what steps will be taken to prevent the spread of germs. What happens during the procedure?  An IV  tube will be put into one of your veins. You may be given: A sedative. This helps you relax. Anesthesia. This will: Numb certain areas of your body. The skin on your neck or groin will be numbed. A cut (incision) will be made in your neck or groin. A needle will be put through the cut and into a large  vein. The small, thin tube (catheter) will be put into the needle. The tube will be moved to your heart. A type of X-ray (fluoroscopy) will be used to help guide the tube. It will also show constant images of the heart on a screen. Dye may be put through the tube. This helps your doctor see your heart. An electric current will be sent from the tube to destroy heart tissue in certain areas. The tube will be taken out. Pressure will be held on your cut. This helps stop bleeding. A bandage (dressing) will be put over your cut. The procedure may vary among doctors and hospitals. What happens after the procedure? You will be monitored until you leave the hospital or clinic. This includes checking your blood pressure, heart rate and rhythm, breathing rate, and blood oxygen level. Your cut will be checked for bleeding. You will need to lie still for a few hours. If your groin was used, you will need to keep your leg straight for a few hours after the small, thin tube is removed. This information is not intended to replace advice given to you by your health care provider. Make sure you discuss any questions you have with your health care provider. Document Revised: 04/19/2022 Document Reviewed: 04/19/2022 Elsevier Patient Education  2024 [employer].

## 2024-02-05 NOTE — Telephone Encounter (Signed)
 Received letter from [facility] pt travel insurance requesting medical documentation from our office pt consent for release of information attached to letter.  Printed out requested OV notes called [facility] for fax number: [phone].

## 2024-02-06 NOTE — Telephone Encounter (Signed)
 Requested information faxed to [facility] [employer] [phone] via email fax.

## 2024-02-07 ENCOUNTER — Ambulatory Visit: Admitting: Cardiology

## 2024-02-08 ENCOUNTER — Other Ambulatory Visit (HOSPITAL_COMMUNITY)

## 2024-02-15 ENCOUNTER — Telehealth: Payer: Self-pay | Admitting: Internal Medicine

## 2024-02-15 NOTE — Telephone Encounter (Signed)
 Spoke with patient and he is aware Roanna Raider is out and can give him a call tomorrow

## 2024-02-15 NOTE — Telephone Encounter (Signed)
 Patient called to follow-up with RN Sherri to check if his procedure can be moved up.

## 2024-02-16 ENCOUNTER — Other Ambulatory Visit: Payer: Self-pay

## 2024-02-16 DIAGNOSIS — I480 Paroxysmal atrial fibrillation: Secondary | ICD-10-CM

## 2024-02-16 MED ORDER — APIXABAN 5 MG PO TABS: 5.0000 mg | ORAL_TABLET | Freq: Two times a day (BID) | ORAL | 1 refills | Status: DC

## 2024-02-16 NOTE — Telephone Encounter (Signed)
 Pt aware the soonest I can offer at this time is 5/16 (from 5/29).  Patient agreeable to moving up to 5/16 and understands we will call if anything sooner becomes available.  He appreciates the help.

## 2024-02-16 NOTE — Telephone Encounter (Signed)
 Prescription refill request for Eliquis received. Indication: DVT  Last office visit: 01/17/24 ([place])  Scr: 0.91 (12/18/23)  Age: 73 Weight: 104.7kg  Appropriate dose. Refill sent.

## 2024-02-27 DIAGNOSIS — N200 Calculus of kidney: Secondary | ICD-10-CM | POA: Diagnosis not present

## 2024-02-27 DIAGNOSIS — Z8546 Personal history of malignant neoplasm of prostate: Secondary | ICD-10-CM | POA: Diagnosis not present

## 2024-02-29 ENCOUNTER — Telehealth: Payer: Self-pay | Admitting: Internal Medicine

## 2024-02-29 NOTE — Telephone Encounter (Signed)
 I called and spoke with pt. He wanted to know if any openings had come available to move his PVC Ablation procedure up with Dr. Lawana Pray. I informed him that at this time we do not have anything sooner but if anyone cancels I will call and let him know.

## 2024-02-29 NOTE — Telephone Encounter (Signed)
 Patient wants a call back directly from RN Sherri regarding moving up his surgery.

## 2024-02-29 NOTE — Telephone Encounter (Signed)
 Spoke with patient and he wanted to speak with Sherri.  He is aware she is out of office until next week. He states he will call her on Monday

## 2024-03-01 ENCOUNTER — Other Ambulatory Visit: Payer: Self-pay

## 2024-03-01 DIAGNOSIS — I493 Ventricular premature depolarization: Secondary | ICD-10-CM

## 2024-03-05 DIAGNOSIS — N5201 Erectile dysfunction due to arterial insufficiency: Secondary | ICD-10-CM | POA: Diagnosis not present

## 2024-03-05 DIAGNOSIS — N200 Calculus of kidney: Secondary | ICD-10-CM | POA: Diagnosis not present

## 2024-03-05 DIAGNOSIS — Z8546 Personal history of malignant neoplasm of prostate: Secondary | ICD-10-CM | POA: Diagnosis not present

## 2024-03-14 ENCOUNTER — Telehealth (HOSPITAL_COMMUNITY): Payer: Self-pay

## 2024-03-14 NOTE — Telephone Encounter (Signed)
 Attempted to reach patient to discuss upcoming procedure, no answer. Left VM for patient to return call.

## 2024-03-15 NOTE — Telephone Encounter (Signed)
 Spoke with patient to complete pre-procedure call.     Pre-procedure testing scheduled: lab work ordered  New medical conditions? No Recent hospitalizations or surgeries? No Any recent signs of acute illness or been started on antibiotics? No Any changes in activities of daily living? No Started any new medications? No Any medications to hold? Ozempic  for 7 days- last dose 5/8, Metoprolol  for 2 days- last dose 5/13 Any missed doses of blood thinner? No  Confirmed patient is taking Eliquis  twice daily and will continue taking medication before procedure or it may need to be rescheduled.  On the morning of your procedure DO NOT take any medication., including Eliquis  or the procedure may be rescheduled. Nothing to eat or drink after midnight prior to your procedure.  Confirmed patient is scheduled for PVC Ablation on Friday,  May 16 with Dr. Agatha Horsfall. Instructed patient to arrive at the Main Entrance A at [facility]: [address] [place], [place] [address] and check in at Admitting at 5:30 AM.  Advised of plan to go home the same day and will only stay overnight if medically necessary. You MUST have a responsible adult to drive you home and MUST be with you the first 24 hours after you arrive home or your procedure could be cancelled.  Patient verbalized understanding to information provided and is agreeable to proceed with procedure.

## 2024-03-17 ENCOUNTER — Other Ambulatory Visit: Payer: Self-pay | Admitting: Family Medicine

## 2024-03-17 DIAGNOSIS — E118 Type 2 diabetes mellitus with unspecified complications: Secondary | ICD-10-CM

## 2024-03-20 LAB — CBC
Hematocrit: 43.3 % (ref 37.5–51.0)
Hemoglobin: 14.9 g/dL (ref 13.0–17.7)
MCH: 32.5 pg (ref 26.6–33.0)
MCHC: 34.4 g/dL (ref 31.5–35.7)
MCV: 95 fL (ref 79–97)
Platelets: 168 10*3/uL (ref 150–450)
RBC: 4.58 x10E6/uL (ref 4.14–5.80)
RDW: 12.2 % (ref 11.6–15.4)
WBC: 9.4 10*3/uL (ref 3.4–10.8)

## 2024-03-20 LAB — BASIC METABOLIC PANEL WITH GFR
BUN/Creatinine Ratio: 17 (ref 10–24)
BUN: 17 mg/dL (ref 8–27)
CO2: 20 mmol/L (ref 20–29)
Calcium: 9.4 mg/dL (ref 8.6–10.2)
Chloride: 101 mmol/L (ref 96–106)
Creatinine, Ser: 0.98 mg/dL (ref 0.76–1.27)
Glucose: 127 mg/dL — ABNORMAL HIGH (ref 70–99)
Potassium: 5 mmol/L (ref 3.5–5.2)
Sodium: 141 mmol/L (ref 134–144)
eGFR: 81 mL/min/{1.73_m2} (ref >59)

## 2024-03-21 ENCOUNTER — Telehealth: Payer: Self-pay

## 2024-03-21 ENCOUNTER — Other Ambulatory Visit (HOSPITAL_COMMUNITY): Payer: Self-pay

## 2024-03-21 NOTE — Telephone Encounter (Signed)
 Pharmacy Patient Advocate Encounter   Received notification from CoverMyMeds that prior authorization for Ozempic  (2 MG/DOSE) 8MG /3ML pen-injectors is required/requested.   Insurance verification completed.   The patient is insured through [facility] ADVANTAGE/RX ADVANCE .   Per test claim: PA required; PA submitted to above mentioned insurance via CoverMyMeds Key/confirmation #/EOC (Key: [id number])  Status is pending

## 2024-03-22 ENCOUNTER — Telehealth (HOSPITAL_COMMUNITY): Payer: Self-pay

## 2024-03-22 ENCOUNTER — Other Ambulatory Visit (HOSPITAL_COMMUNITY): Payer: Self-pay

## 2024-03-22 NOTE — Telephone Encounter (Signed)
 Pharmacy Patient Advocate Encounter  Received notification from [employer] ADVANCE that Prior Authorization for Ozempic  (2 MG/DOSE) 8MG /3ML pen-injectors  has been APPROVED from 5.8.25 to 5.8.26. Ran test claim, Copay is $117.50 FOR SUPLY. This test claim was processed through [facility]- copay amounts may vary at other pharmacies due to pharmacy/plan contracts, or as the patient moves through the different stages of their insurance plan.   PA #/Case ID/Reference #: (Key: [id number])

## 2024-03-22 NOTE — Progress Notes (Signed)
 " Cardiology Office Note    Patient Name: Jeremy Tucker Date of Encounter: 03/22/2024  Primary Care Provider:  Joyce Norleen BROCKS, MD Primary Cardiologist:  Stanly DELENA Leavens, MD Primary Electrophysiologist: Will Gladis Norton, MD   Past Medical History    Past Medical History:  Diagnosis Date   Anxiety    Arthritis    Asthma    Benign fundic gland polyps of stomach    BPH (benign prostatic hyperplasia)    BPH without obstruction/lower urinary tract symptoms    Depression    Diabetic peripheral neuropathy (HCC) 01/07/2019   Dyspnea    w/exertion   ED (erectile dysfunction)    Erosive esophagitis    Focal active colitis 2019   GERD (gastroesophageal reflux disease)    Hiatal hernia    History of chronic bronchitis    per pt used inhaler as needed   History of DVT of lower extremity 2001   s/p  left TKA completed blood thinner treatment,  per pt no previous clot and none since   History of gastric polyp    per pt benign   History of kidney stones    Hx of adenomatous colonic polyps    Hyperlipidemia    Mild CAD cardiologist--- dr h. claudene   nuclear study 04-14-2017 normal , low risk, nuclear ef 53%;  cardiac cath 09-19-2018  midRCA 30% stenosis otherwise normal coronaries,  heavy AV calicification with decreased mobility with peak grandiant   Mild dilation of ascending aorta (HCC)    per echo 01-13-2021  38mm   Nephrolithiasis    OA (osteoarthritis)    OSA (obstructive sleep apnea)    per pt refused cpap, intolerant;  moderate osa per study in epic 03-08-2015   PAF (paroxysmal atrial fibrillation) (HCC)    followed by dr h. claudene and dr allred 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 and ep ablation 04/ 2016   Peroneal neuropathy at knee, right 12/05/2018   Phytobezoar    Prostate cancer [facility]) urologist--- dr dahlstedt/  oncology-- dr patrcia   first dx 03/ 2021,  Glesaon 3+4  PSA 6.95   Pulmonary nodule 2019   incidently finding on CTA 09-25-2018, stable   Right carpal tunnel  syndrome 01/07/2019   Severe aortic stenosis    Type 2 diabetes mellitus (HCC)    followed by pcp   (03-02-2021  pt stated does not check blood sugar at home)   Umbilical hernia     History of Present Illness  Jeremy Tucker is a 73 y.o. male with a PMH of paroxysmal AF s/p ablation 02/2015, mild CAD, severe AAS s/p SAVR with LAA occlusion 09/2021, OSA (intolerant to CPAP), prostate CA s/p radioactive seed implant 02/2021, HTN, HLD, DM type II, RBBB, remote history of DVT following knee surgery who presents today for 61-month follow-up.  Jeremy Tucker was previously followed by Dr. Claudene and is currently followed by Dr. Arnetha for history of aortic stenosis and PAF.  He was diagnosed with atrial fibrillation 01/2015 and was started on sotalol  and Eliquis  then transition to flecainide  due to wheezing.  He underwent a AF ablation on 02/2015 by Dr. Kelsie.  He completed an exercise Myoview  on 02/04/2015 that was normal.  TTE was completed on 02/2015 that showed EF of 60 to 65% with moderate AS and gradient of 25 mmHg.  He underwent an ILR implant on 09/15/2015 by Dr. Kelsie.  He was seen in follow-up 04/25/2017 for complaint of chest pain and underwent an exercise  Myoview  that was low risk.  He was seen on 09/10/2018 by Dr. Claudene with complaint of chest pain.  He underwent an LHC that showed 30% proximal RCA narrowing with heavy calcified aortic valve 33 mmHg and minimal atherosclerosis.SABRA  He underwent a coronary CTA for further evaluation of AS stenosis with mild aortic dilation noted.  He was seen 07/27/2021 following TTE that showed echo 60-65%, moderate asymmetric basal septal hypertrophy, grade 1 DD moderate LAE, mild MR, mild AI, severe AS.  He underwent R/LHC and was referred to Dr. Wonda for evaluation of TAVR.  He completed a CTA of the chest and underwent Aortic Valve Replacement with a 25 mm Edwards Resilia Bioprosthetic valve and clipping of his LA appendage with a 35 mm Atricure Pro2 Clip by Dr.  Lucas.  He was treated with amiodarone  postprocedure for atrial fibrillation.  He was seen initially by Dr. Arnetha on 05/15/2023 and reported doing well with no significant cardiac changes.  EKG showed sinus rhythm and was granted clearance for upcoming procedure.  He was last seen on 12/20/2023 and was experiencing frequent PVCs symptoms of sluggishness shortness of breath and dizziness.  He was being treated for pneumonia with Z-Pak and amoxicillin  for bronchitis.  He had metoprolol  increased to 37.5 mg and was referred to EP for evaluation and potential ablation.  He was seen by Dr. Inocencio on 02/01/2024 of and found to be a good candidate with burden of 26% noted on cardiac monitor.  He is scheduled to undergo procedure on 03/29/2024.  Jeremy Tucker presents today for 41-month follow-up. He reports experiencing ongoing dizziness and shortness of breath, particularly when walking around the house, though he can walk a mile and a half three to four times a week without issue. He experiences occasional chest twinges lasting 20-30 seconds. He was treated for pneumonia and bronchitis earlier this year and has been experiencing increased PVCs, feeling sluggish, dizzy, and short of breath. His metoprolol  dosage was adjusted, initially increased and then reduced to 25 mg due to side effects. He currently feels dizzy and acknowledges not drinking enough water . He drinks a lot of zero Gatorade.  He reports compliance with his medication regimen.  He is scheduled to undergo a PVC ablation on Friday reports no increase in palpitations. Patient denies chest pain, palpitations, dyspnea, PND, orthopnea, nausea, vomiting, dizziness, syncope, edema, weight gain, or early satiety.  Discussed the use of AI scribe software for clinical note transcription with the patient, who gave verbal consent to proceed.  History of Present Illness   Review of Systems  Please see the history of present illness.    All other systems  reviewed and are otherwise negative except as noted above.  Physical Exam     Wt Readings from Last 3 Encounters:  02/01/24 230 lb 12.8 oz (104.7 kg)  12/20/23 233 lb (105.7 kg)  09/05/23 226 lb 9.6 oz (102.8 kg)   CD:Uyzmz were no vitals filed for this visit.,There is no height or weight on file to calculate BMI. GEN: Well nourished, well developed in no acute distress Neck: No JVD; No carotid bruits Pulmonary: Clear to auscultation without rales, wheezing or rhonchi  Cardiovascular: Normal rate. Regular rhythm. Normal S1. Normal S2.   Murmurs: There is no murmur.  ABDOMEN: Soft, non-tender, non-distended EXTREMITIES:  No edema; No deformity   EKG/LABS/ Recent Cardiac Studies   ECG personally reviewed by me today -none completed today  Risk Assessment/Calculations:    CHA2DS2-VASc Score =  This indicates a  % annual risk of stroke. The patient's score is based upon:          Lab Results  Component Value Date   WBC 9.4 03/19/2024   HGB 14.9 03/19/2024   HCT 43.3 03/19/2024   MCV 95 03/19/2024   PLT 168 03/19/2024   Lab Results  Component Value Date   CREATININE 0.98 03/19/2024   BUN 17 03/19/2024   NA 141 03/19/2024   K 5.0 03/19/2024   CL 101 03/19/2024   CO2 20 03/19/2024   Lab Results  Component Value Date   CHOL 133 09/05/2023   HDL 52 09/05/2023   LDLCALC 61 09/05/2023   TRIG 112 09/05/2023   CHOLHDL 2.6 09/05/2023    Lab Results  Component Value Date   HGBA1C 6.7 (A) 09/05/2023   Assessment & Plan    Assessment & Plan  1.  Nonrheumatic aortic stenosis: -s/p Aortic Valve Replacement with a 25 mm Edwards Resilia Bioprosthetic with clipping of atrial appendage in 2022 -SBE prophylaxis discussed - Patient's blood pressure is stable at 120/72 denies any chest pain but endorses some dizziness.  2.  Essential hypertension: - Patient's blood pressure today was 120/72 - Continue metoprolol  25 mg daily  3.  Hyperlipidemia: - Patient's LDL  cholesterol was 61 at goal - Continue Crestor  40 mg daily  4.  History of PVCs/Dizziness -Increased PVCs causing dizziness and shortness of breath. Scheduled for ablation on Mar 29, 2024. Metoprolol  dosage adjusted to avoid suppression of PVCs before mapping. - Proceed with ablation on Mar 29, 2024. - Maintain current metoprolol  dosage until post-ablation. Dizziness likely due to PVCs, mitral regurgitation, and possible dehydration. Discussed increasing fluid intake. Consider metoprolol  adjustment if dizziness persists post-ablation. - Increase fluid intake to 64 ounces per day. - Reassess dizziness post-ablation and consider metoprolol  dosage adjustment if necessary.  5.  Nonrheumatic mitral regurgitation: Mild to moderate mitral regurgitation noted. Potential contributor to dizziness and shortness of breath. Discussed future interventions if condition worsens.  6. Paroxysmal atrial fibrillation: -Atrial fibrillation with previous ablation and left atrial appendage clip in November 2022.  -Currently not in atrial fibrillation. No recent episodes reported. - Patient's last creatinine was 0.9 and hemoglobin was 14.3 - Continue Eliquis  5 mg twice daily  Disposition: Follow-up with Stanly DELENA Leavens, MD or APP in 6 months    Signed, Wyn Raddle, Jackee Shove, NP 03/22/2024, 1:06 PM [facility] "

## 2024-03-22 NOTE — Telephone Encounter (Signed)
 Spoke with patient to discuss upcoming procedure.   Labs: completed.   Any recent signs of acute illness or been started on antibiotics? No Any new medications started? No Any medications to hold?  Ozempic  for 7 days- last dose 5/8, Metoprolol  for 2 days- last dose 5/13  Any missed doses of blood thinner?  Reports he missed one dose of Eliquis . Will make Dr. Lawana Pray aware. Advised patient to continue taking ANTICOAGULANT: Eliquis  (Apixaban ) twice daily without missing any doses.  Medication instructions:  On the morning of your procedure DO NOT take any medication., including Eliquis  or the procedure may be rescheduled. Nothing to eat or drink after midnight prior to your procedure.  Confirmed patient is scheduled for PVC Ablation on Friday, May 16 with Dr. Agatha Horsfall. Instructed patient to arrive at the Main Entrance A at [facility]: [address] [place], [place] [address] and check in at Admitting at 5:30 AM.  Advised of plan to go home the same day and will only stay overnight if medically necessary. You MUST have a responsible adult to drive you home and MUST be with you the first 24 hours after you arrive home or your procedure could be cancelled.  Patient verbalized understanding to all instructions provided and agreed to proceed with procedure.

## 2024-03-26 ENCOUNTER — Ambulatory Visit: Payer: PPO | Attending: Nurse Practitioner | Admitting: Nurse Practitioner

## 2024-03-26 ENCOUNTER — Encounter: Payer: Self-pay | Admitting: Nurse Practitioner

## 2024-03-26 VITALS — BP 120/72 | HR 88 | Ht 72.0 in | Wt 228.6 lb

## 2024-03-26 DIAGNOSIS — I700 Atherosclerosis of aorta: Secondary | ICD-10-CM | POA: Diagnosis not present

## 2024-03-26 DIAGNOSIS — E785 Hyperlipidemia, unspecified: Secondary | ICD-10-CM | POA: Diagnosis not present

## 2024-03-26 DIAGNOSIS — I480 Paroxysmal atrial fibrillation: Secondary | ICD-10-CM | POA: Diagnosis not present

## 2024-03-26 DIAGNOSIS — R42 Dizziness and giddiness: Secondary | ICD-10-CM | POA: Diagnosis not present

## 2024-03-26 DIAGNOSIS — I493 Ventricular premature depolarization: Secondary | ICD-10-CM | POA: Diagnosis not present

## 2024-03-26 DIAGNOSIS — Z952 Presence of prosthetic heart valve: Secondary | ICD-10-CM

## 2024-03-26 DIAGNOSIS — I491 Atrial premature depolarization: Secondary | ICD-10-CM

## 2024-03-26 DIAGNOSIS — I10 Essential (primary) hypertension: Secondary | ICD-10-CM

## 2024-03-26 NOTE — Patient Instructions (Signed)
 Medication Instructions:  Your physician recommends that you continue on your current medications as directed. Please refer to the Current Medication list given to you today. *If you need a refill on your cardiac medications before your next appointment, please call your pharmacy*  Lab Work: None ordered If you have labs (blood work) drawn today and your tests are completely normal, you will receive your results only by: MyChart Message (if you have MyChart) OR A paper copy in the mail If you have any lab test that is abnormal or we need to change your treatment, we will call you to review the results.  Testing/Procedures: None ordered  Follow-Up: At [facility], you and your health needs are our priority.  As part of our continuing mission to provide you with exceptional heart care, our providers are all part of one team.  This team includes your primary Cardiologist (physician) and Advanced Practice Providers or APPs (Physician Assistants and Nurse Practitioners) who all work together to provide you with the care you need, when you need it.  Your next appointment:   6 month(s)  Provider:   Jann Melody, MD    We recommend signing up for the patient portal called "MyChart".  Sign up information is provided on this After Visit Summary.  MyChart is used to connect with patients for Virtual Visits (Telemedicine).  Patients are able to view lab/test results, encounter notes, upcoming appointments, etc.  Non-urgent messages can be sent to your provider as well.   To learn more about what you can do with MyChart, go to [email or web address].   Other Instructions DRINK 64 OUNCES OF WATER  A DAY CONTACT THE OFFICE IF YOU CONTINUE TO HAVE DIZZINESS

## 2024-03-28 NOTE — Anesthesia Preprocedure Evaluation (Signed)
 Anesthesia Evaluation  Patient identified by MRN, date of birth, ID band Patient awake    Reviewed: Allergy & Precautions, NPO status , Patient's Chart, lab work & pertinent test results  History of Anesthesia Complications Negative for: history of anesthetic complications  Airway Mallampati: II  TM Distance: >3 FB Neck ROM: Full    Dental  (+) Teeth Intact, Dental Advisory Given   Pulmonary asthma (last used yesterday) , sleep apnea (sleep study many years ago, refused cpap)    Pulmonary exam normal breath sounds clear to auscultation       Cardiovascular hypertension (160/79 preop, usually 120s SBP), Pt. on medications + CAD  Normal cardiovascular exam+ dysrhythmias (on Eliquis  yesterday) Atrial Fibrillation  Rhythm:Regular Rate:Normal  Frequent PVCs  TTE 01/26/24: Abnormal septal motion, EF 50-55%, mild LVH, mild pHTN, severe LAE, mild to moderate MR, s/p AVR      Neuro/Psych  PSYCHIATRIC DISORDERS Anxiety Depression       GI/Hepatic Neg liver ROS, hiatal hernia, PUD,GERD  Medicated and Controlled,,  Endo/Other  diabetes (on Ozempic ), Well Controlled, Type 2  BMI 30  Renal/GU negative Renal ROS     Musculoskeletal  (+) Arthritis , Osteoarthritis,    Abdominal   Peds  Hematology negative hematology ROS (+)   Anesthesia Other Findings Ozempic  LD: >1week   Reproductive/Obstetrics                             Anesthesia Physical Anesthesia Plan  ASA: 2  Anesthesia Plan: MAC   Post-op Pain Management: Minimal or no pain anticipated   Induction: Intravenous  PONV Risk Score and Plan: 2 and Treatment may vary due to age or medical condition, Ondansetron , Propofol  infusion and TIVA  Airway Management Planned: Natural Airway and Simple Face Mask  Additional Equipment: None  Intra-op Plan:   Post-operative Plan:   Informed Consent: I have reviewed the patients History and Physical,  chart, labs and discussed the procedure including the risks, benefits and alternatives for the proposed anesthesia with the patient or authorized representative who has indicated his/her understanding and acceptance.     Dental advisory given  Plan Discussed with: CRNA  Anesthesia Plan Comments: (D/w pt MAC and general anesthesia depending on surgeon needs for the procedure)       Anesthesia Quick Evaluation

## 2024-03-28 NOTE — Pre-Procedure Instructions (Signed)
Instructed patient on the following items: Arrival time 0515 Nothing to eat or drink after midnight No meds AM of procedure Responsible person to drive you home and stay with you for 24 hrs     

## 2024-03-29 ENCOUNTER — Ambulatory Visit (HOSPITAL_COMMUNITY): Payer: Self-pay | Admitting: Anesthesiology

## 2024-03-29 ENCOUNTER — Ambulatory Visit (HOSPITAL_COMMUNITY)
Admission: RE | Disposition: A | Discharge status: Home / Self Care | Payer: Self-pay | Source: Home / Self Care | Attending: Cardiology

## 2024-03-29 ENCOUNTER — Ambulatory Visit (HOSPITAL_COMMUNITY)
Admission: RE | Admit: 2024-03-29 | Discharge: 2024-03-29 | Disposition: A | Discharge status: Home / Self Care | Attending: Cardiology | Admitting: Cardiology

## 2024-03-29 ENCOUNTER — Other Ambulatory Visit: Payer: Self-pay

## 2024-03-29 DIAGNOSIS — I493 Ventricular premature depolarization: Secondary | ICD-10-CM | POA: Diagnosis not present

## 2024-03-29 DIAGNOSIS — K219 Gastro-esophageal reflux disease without esophagitis: Secondary | ICD-10-CM | POA: Insufficient documentation

## 2024-03-29 DIAGNOSIS — Z7985 Long-term (current) use of injectable non-insulin antidiabetic drugs: Secondary | ICD-10-CM | POA: Insufficient documentation

## 2024-03-29 DIAGNOSIS — G4730 Sleep apnea, unspecified: Secondary | ICD-10-CM | POA: Diagnosis not present

## 2024-03-29 DIAGNOSIS — E119 Type 2 diabetes mellitus without complications: Secondary | ICD-10-CM | POA: Insufficient documentation

## 2024-03-29 DIAGNOSIS — I480 Paroxysmal atrial fibrillation: Secondary | ICD-10-CM | POA: Diagnosis not present

## 2024-03-29 DIAGNOSIS — I10 Essential (primary) hypertension: Secondary | ICD-10-CM | POA: Insufficient documentation

## 2024-03-29 DIAGNOSIS — Z7901 Long term (current) use of anticoagulants: Secondary | ICD-10-CM | POA: Diagnosis not present

## 2024-03-29 DIAGNOSIS — I2510 Atherosclerotic heart disease of native coronary artery without angina pectoris: Secondary | ICD-10-CM | POA: Insufficient documentation

## 2024-03-29 HISTORY — PX: PVC ABLATION: EP1236

## 2024-03-29 LAB — GLUCOSE, CAPILLARY: Glucose-Capillary: 159 mg/dL — ABNORMAL HIGH (ref 70–99)

## 2024-03-29 SURGERY — PVC ABLATION
Anesthesia: Monitor Anesthesia Care

## 2024-03-29 MED ORDER — PROPOFOL 1000 MG/100ML IV EMUL
INTRAVENOUS | Status: AC
  Filled 2024-03-29: qty 400

## 2024-03-29 MED ORDER — FENTANYL CITRATE (PF) 100 MCG/2ML IJ SOLN
INTRAMUSCULAR | Status: AC
  Filled 2024-03-29: qty 2

## 2024-03-29 MED ORDER — MIDAZOLAM HCL 2 MG/2ML IJ SOLN
INTRAMUSCULAR | Status: DC | PRN
  Administered 2024-03-29: 2 mg via INTRAVENOUS

## 2024-03-29 MED ORDER — HEPARIN (PORCINE) IN NACL 2000-0.9 UNIT/L-% IV SOLN
INTRAVENOUS | Status: DC | PRN
  Administered 2024-03-29 (×2): 1000 mL

## 2024-03-29 MED ORDER — GLYCOPYRROLATE PF 0.2 MG/ML IJ SOSY
PREFILLED_SYRINGE | INTRAMUSCULAR | Status: DC | PRN
Start: 2024-03-29 — End: 2024-03-29
  Administered 2024-03-29: .2 mg via INTRAVENOUS

## 2024-03-29 MED ORDER — SODIUM CHLORIDE 0.9 % IV SOLN: INTRAVENOUS | Status: DC

## 2024-03-29 MED ORDER — PROPOFOL 10 MG/ML IV BOLUS
INTRAVENOUS | Status: DC | PRN
  Administered 2024-03-29: 85 ug/kg/min via INTRAVENOUS

## 2024-03-29 MED ORDER — FENTANYL CITRATE (PF) 250 MCG/5ML IJ SOLN
INTRAMUSCULAR | Status: DC | PRN
  Administered 2024-03-29: 25 ug via INTRAVENOUS

## 2024-03-29 MED ORDER — EPHEDRINE SULFATE-NACL 50-0.9 MG/10ML-% IV SOSY
PREFILLED_SYRINGE | INTRAVENOUS | Status: DC | PRN
  Administered 2024-03-29: 10 mg via INTRAVENOUS

## 2024-03-29 MED ORDER — MIDAZOLAM HCL 2 MG/2ML IJ SOLN
INTRAMUSCULAR | Status: AC
  Filled 2024-03-29: qty 2

## 2024-03-29 MED ORDER — LACTATED RINGERS IV SOLN: INTRAVENOUS | Status: DC | PRN

## 2024-03-29 MED ORDER — PHENYLEPHRINE HCL-NACL 20-0.9 MG/250ML-% IV SOLN
INTRAVENOUS | Status: AC
  Filled 2024-03-29: qty 750

## 2024-03-29 MED ORDER — MEXILETINE HCL 250 MG PO CAPS: 250.0000 mg | ORAL_CAPSULE | Freq: Two times a day (BID) | ORAL | 11 refills | Status: DC

## 2024-03-29 MED ORDER — ISOPROTERENOL HCL 0.2 MG/ML IJ SOLN
INTRAMUSCULAR | Status: AC
  Filled 2024-03-29: qty 5

## 2024-03-29 MED ORDER — PHENYLEPHRINE HCL-NACL 20-0.9 MG/250ML-% IV SOLN
INTRAVENOUS | Status: DC | PRN
  Administered 2024-03-29: 20 ug/min via INTRAVENOUS

## 2024-03-29 MED ORDER — PHENYLEPHRINE 80 MCG/ML (10ML) SYRINGE FOR IV PUSH (FOR BLOOD PRESSURE SUPPORT)
PREFILLED_SYRINGE | INTRAVENOUS | Status: DC | PRN
  Administered 2024-03-29: 160 ug via INTRAVENOUS
  Administered 2024-03-29: 80 ug via INTRAVENOUS

## 2024-03-29 MED ORDER — ISOPROTERENOL HCL 0.2 MG/ML IJ SOLN
INTRAVENOUS | Status: DC | PRN
  Administered 2024-03-29: 2 ug/min via INTRAVENOUS

## 2024-03-29 SURGICAL SUPPLY — 12 items
BAG SNAP BAND KOVER 36X36 (MISCELLANEOUS) IMPLANT
CATH GE 8FR SOUNDSTAR (CATHETERS) IMPLANT
CLOSURE MYNX CONTROL 6F/7F (Vascular Products) IMPLANT
CLOSURE PERCLOSE PROSTYLE (VASCULAR PRODUCTS) IMPLANT
MAT PREVALON FULL STRYKER (MISCELLANEOUS) IMPLANT
PACK EP LF (CUSTOM PROCEDURE TRAY) ×1 IMPLANT
PAD DEFIB RADIO PHYSIO CONN (PAD) ×1 IMPLANT
PATCH CARTO3 (PAD) IMPLANT
SHEATH BAYLIS TRANSSEPTAL 98CM (NEEDLE) IMPLANT
SHEATH PINNACLE 8F 10CM (SHEATH) IMPLANT
SHEATH PINNACLE 9F 10CM (SHEATH) IMPLANT
SHEATH PROBE COVER 6X72 (BAG) IMPLANT

## 2024-03-29 NOTE — Transfer of Care (Signed)
 Immediate Anesthesia Transfer of Care Note  Patient: Jeremy Tucker  Procedure(s) Performed: PVC ABLATION  Patient Location: PACU  Anesthesia Type:MAC  Level of Consciousness: awake, alert , and oriented  Airway & Oxygen  Therapy: Patient Spontanous Breathing  Post-op Assessment: Report given to RN  Post vital signs: Reviewed  Last Vitals:  Vitals Value Taken Time  BP 136/77 03/29/24 0842  Temp    Pulse 76 03/29/24 0846  Resp 10 03/29/24 0846  SpO2 94 % 03/29/24 0846  Vitals shown include unfiled device data.  Last Pain:  Vitals:   03/29/24 0539  TempSrc:   PainSc: 0-No pain      Patients Stated Pain Goal: 4 (03/29/24 0539)  Complications: No notable events documented.

## 2024-03-29 NOTE — H&P (Signed)
  Electrophysiology Office Note:   Date:  03/29/2024  ID:  Jeremy Tucker, DOB 03-12-1951, MRN [id number]  Primary Cardiologist: Jann Melody, MD Primary Heart Failure: None Electrophysiologist: Aanvi Voyles Cortland Ding, MD      History of Present Illness:   Jeremy Tucker is a 73 y.o. male with h/o hypertension, hyperlipidemia, diabetes, PACs post ablation in 2016, sleep apnea, aortic stenosis with left atrial appendage ligation seen today for  for Electrophysiology evaluation of PVCs at the request of Mahesh Chandrashekar.    Today, denies symptoms of palpitations, chest pain, shortness of breath, orthopnea, PND, lower extremity edema, claudication, dizziness, presyncope, syncope, bleeding, or neurologic sequela. The patient is tolerating medications without difficulties. Plan PVC ablation today.   EP Information / Studies Reviewed:    EKG is not ordered today. EKG from 12/18/23 reviewed which showed sinus rhythm with PVCs        Risk Assessment/Calculations:             Physical Exam:   VS:  BP (!) 160/79   Pulse 72   Temp 98.4 F (36.9 C) (Oral)   Resp 20   Ht 6' (1.829 m)   Wt 103.4 kg   SpO2 99%   BMI 30.92 kg/m    Wt Readings from Last 3 Encounters:  03/29/24 103.4 kg  03/26/24 103.7 kg  02/01/24 104.7 kg    GEN: No acute distress.   Neck: No JVD Cardiac: RRR, no murmurs, rubs, or gallops.  Respiratory: normal BS bilaterally. GI: Soft, nontender, non-distended  MS: No edema; No deformity. Neuro:  Nonfocal  Skin: warm and dry,  Psych: Normal affect    ASSESSMENT AND PLAN:    1.  PVCs: Jeremy Tucker has presented today for surgery, with the diagnosis of PVC.  The various methods of treatment have been discussed with the patient and family. After consideration of risks, benefits and other options for treatment, the patient has consented to  Procedure(s): Catheter ablation as a surgical intervention .  Risks include but not limited to complete heart block,  stroke, esophageal damage, nerve damage, bleeding, vascular damage, tamponade, perforation, MI, and death. The patient's history has been reviewed, patient examined, no change in status, stable for surgery.  I have reviewed the patient's chart and labs.  Questions were answered to the patient's satisfaction.    Leonardo Plaia Lawana Pray, MD 03/29/2024 7:09 AM

## 2024-03-29 NOTE — Anesthesia Postprocedure Evaluation (Signed)
 Anesthesia Post Note  Patient: Jeremy Tucker  Procedure(s) Performed: PVC ABLATION     Patient location during evaluation: PACU Anesthesia Type: MAC Level of consciousness: awake and alert Pain management: pain level controlled Vital Signs Assessment: post-procedure vital signs reviewed and stable Respiratory status: spontaneous breathing, nonlabored ventilation and respiratory function stable Cardiovascular status: blood pressure returned to baseline and stable Postop Assessment: no apparent nausea or vomiting Anesthetic complications: no   No notable events documented.  Last Vitals:  Vitals:   03/29/24 0923 03/29/24 0930  BP: 138/89 (!) 150/78  Pulse: 71 67  Resp:  14  Temp:    SpO2: 95% 94%    Last Pain:  Vitals:   03/29/24 0923  TempSrc:   PainSc: 0-No pain                 Jacquelyne Matte

## 2024-03-31 ENCOUNTER — Encounter (HOSPITAL_COMMUNITY): Payer: Self-pay | Admitting: Cardiology

## 2024-04-01 MED FILL — Fentanyl Citrate Preservative Free (PF) Inj 100 MCG/2ML: INTRAMUSCULAR | Qty: 0.5 | Status: AC

## 2024-04-15 ENCOUNTER — Telehealth: Payer: Self-pay | Admitting: Cardiology

## 2024-04-15 NOTE — Telephone Encounter (Signed)
Left message for pharmacy to call back. 

## 2024-04-15 NOTE — Telephone Encounter (Signed)
 Pt c/o medication issue:  1. Name of Medication:   mexiletine (MEXITIL) 250 MG capsule    2. How are you currently taking this medication (dosage and times per day)?  Take 1 capsule (250 mg total) by mouth 2 (two) times daily.       3. Are you having a reaction (difficulty breathing--STAT)? No  4. What is your medication issue? Mylinda Asa, pharmacist is requesting a callback needing confirmation on the dosage for the quantity. Please advise

## 2024-04-15 NOTE — Telephone Encounter (Signed)
Received this from the pharmacy.

## 2024-04-16 MED ORDER — MEXILETINE HCL 250 MG PO CAPS: 250.0000 mg | ORAL_CAPSULE | Freq: Two times a day (BID) | ORAL | 1 refills | Status: DC

## 2024-04-16 NOTE — Telephone Encounter (Signed)
 Spoke to pt, aware I would get correct Rx sent in to pharmacy.  Sent in 90 day supply as requested by pt.  He appreciates my help

## 2024-04-16 NOTE — Telephone Encounter (Signed)
 Patient is requesting to speak with a nurse in regard to this medication. Patient stated he was only given 15 days worth of medication and would like to know why. Patient stated he is currently out of the medication. Please advise.

## 2024-04-20 ENCOUNTER — Other Ambulatory Visit: Payer: Self-pay | Admitting: Family Medicine

## 2024-04-20 DIAGNOSIS — F419 Anxiety disorder, unspecified: Secondary | ICD-10-CM

## 2024-04-26 ENCOUNTER — Ambulatory Visit: Admitting: Pulmonary Disease

## 2024-05-01 NOTE — Progress Notes (Addendum)
 Cardiology Office Note:  .   Date:  05/01/2024  ID:  Gordy GORMAN Nida, DOB 1951/03/19, MRN [id number] PCP: Joyce Norleen BROCKS, MD  [facility] Providers Cardiologist:  Stanly DELENA Leavens, MD Electrophysiologist:  Soyla Gladis Norton, MD {  History of Present Illness: .   BENJAMIN CASANAS is a 73 y.o. 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) discussed trans septal approach given his AVR, planned to proceed with ablation in effort to avoid meds  03/29/24: W/anesthesia PVCs disappeared > despite isuprel  PVCs remained quiet > propofol  stopped > no PVCs Aborted PVC ablation 03/29/24 Started on mexiletine  Today's visit is scheduled as his post ablation visit ROS:   He is accompanied by his wife He has not felt any measurable or symptomatic improvement with the mexiletine, but tolerating it well Continues to feel fleeting/sharp heart beats (that is felt to be his PVCs) No CP otherwise No near syncope or syncope but says he gets some transient dizziness No SOB or typically does not get winded with exertion, but sometimes does notice with activities he occasionally feel winded, no clear pattern   Arrhythmia/AAD hx PACs (by chart notes, ablated, Dr. Kelsie) AFib  Amiodarone  briefly post-op (2022 s/p AVR) PVC ablation ABORTED with no PVCs at time of procedure, 03/29/24 (Dr. Norton) Started on mexiletine 250mg  BID  Studies Reviewed: SABRA    EKG done today and reviewed by myself:  SR 68bpm, LAD, RBBB, no PVCs  01/26/24: TTE 1. Abnormal septal motion . Left ventricular ejection fraction, by  estimation, is 50 to 55%. The left ventricle has low normal function. The  left ventricle has no regional wall motion abnormalities. There is mild  left ventricular hypertrophy. Left   ventricular diastolic parameters are indeterminate.   2. Right ventricular systolic function is normal. The right ventricular  size is normal. There is mildly elevated pulmonary artery systolic  pressure.   3. Left atrial size was severely dilated.   4. The mitral valve is abnormal. Mild to moderate mitral valve  regurgitation. No evidence of mitral stenosis.   5. Post AVR with 25 mm Edwards Inpiris Resilia bioprothetic valve. Some  calcium  noted on leaflets. mean gradient 14 peak 26 mmHg no PVL. The  aortic valve has been repaired/replaced. Aortic valve regurgitation is not  visualized. No aortic stenosis is  present. There is a 25 mm INSPIRIS RESILIA valve present in the aortic  position. Procedure Date: 09/20/2021.   6. The inferior vena cava is normal in size with greater than 50%  respiratory variability, suggesting right atrial pressure of 3 mmHg.     2D Doppler echocardiogram 11/02/2021: IMPRESSIONS   1. Left ventricular ejection fraction, by estimation, is 60 to 65%. The  left ventricle has normal function. The left ventricle has no regional  wall motion abnormalities. There is mild asymmetric left ventricular  hypertrophy of the basal and septal  segments. Left ventricular diastolic parameters are indeterminate.   2. Right ventricular systolic function is normal. The right ventricular  size is normal. There is normal pulmonary artery systolic pressure. The  estimated right ventricular systolic pressure is 34.6 mmHg.   3. The mitral valve is normal in structure. Mild mitral valve  regurgitation. No evidence of mitral stenosis.   4. The aortic valve has  been repaired/replaced. Aortic valve  regurgitation is not visualized. No aortic stenosis is present. There is a  25 mm Inspiris Resilia valve present in the aortic position. Echo findings  are consistent with normal structure and  function of the aortic valve prosthesis. Aortic valve area, by VTI  measures 2.10 cm. Aortic  valve mean gradient measures 7.6 mmHg. Aortic  valve Vmax measures 1.99 m/s.   5. The inferior vena cava is normal in size with greater than 50%  respiratory variability, suggesting right atrial pressure of 3 mmHg.   Risk Assessment/Calculations:    Physical Exam:   VS:  There were no vitals taken for this visit.   Wt Readings from Last 3 Encounters:  03/29/24 228 lb (103.4 kg)  03/26/24 228 lb 9.6 oz (103.7 kg)  02/01/24 230 lb 12.8 oz (104.7 kg)    GEN: Well nourished, well developed in no acute distress NECK: No JVD; No carotid bruits CARDIAC: RRR, no murmurs, rubs, gallops RESPIRATORY:  CTA b/l without rales, wheezing or rhonchi  ABDOMEN: Soft, non-tender, non-distended EXTREMITIES: No edema; No deformity   ASSESSMENT AND PLAN: .    paroxysmal AFib CHA2DS2Vasc is 3, on Eliquis , appropriately dosed He has a LAA clip, but dont see any imaging that has confirmed complete occlusion of the appendage (including his intra-op TEE) No clear symptoms of AFib No AFib on recent monitors  ADDEND: Dr. Milan thoughts If he is having minimal AF and would like to come of [facility], CT-LAA study should be able to evaluated patency. I think we can discuss this at his next f/u visit Charlies Arthur, [facility] 05/03/24  PVCs Mexiletine He has not appreciated a clear improvement in symptoms on the mexiletine No PVCs on his EKG today or with prolonged auscultation He would be open to the idea of trying an ablation with minimal sedation (if that were to be an option), but more inclined to give mexiletine a longer chance, or alternative medication perhaps  Will get a monitor on mexiletine  If his burden is better/low, would be inclined to continue   VHD S/p AVR (bioprosthetic) 2022 Stable function echo 2025 C/w Dr. Ascencion  Secondary hypercoagulable state 2/2 AFib       Dispo: back in a 3 months, sooner if needed, he has a couple trips to [place] coming up  Signed, Charlies Macario Arthur, PA-C

## 2024-05-02 ENCOUNTER — Ambulatory Visit: Attending: Physician Assistant | Admitting: Physician Assistant

## 2024-05-02 ENCOUNTER — Ambulatory Visit: Attending: Physician Assistant

## 2024-05-02 ENCOUNTER — Encounter: Payer: Self-pay | Admitting: Physician Assistant

## 2024-05-02 VITALS — BP 138/74 | HR 68 | Ht 72.0 in | Wt 232.0 lb

## 2024-05-02 DIAGNOSIS — D6869 Other thrombophilia: Secondary | ICD-10-CM

## 2024-05-02 DIAGNOSIS — I493 Ventricular premature depolarization: Secondary | ICD-10-CM

## 2024-05-02 DIAGNOSIS — Z952 Presence of prosthetic heart valve: Secondary | ICD-10-CM | POA: Diagnosis not present

## 2024-05-02 DIAGNOSIS — I480 Paroxysmal atrial fibrillation: Secondary | ICD-10-CM

## 2024-05-02 NOTE — Patient Instructions (Addendum)
 Medication Instructions:   Your physician recommends that you continue on your current medications as directed. Please refer to the Current Medication list given to you today.  *If you need a refill on your cardiac medications before your next appointment, please call your pharmacy*  Lab Work: NONE ORDERED  TODAY    If you have labs (blood work) drawn today and your tests are completely normal, you will receive your results only by: MyChart Message (if you have MyChart) OR A paper copy in the mail If you have any lab test that is abnormal or we need to change your treatment, we will call you to review the results.  Testing/Procedures: Your physician has recommended that you wear an event monitor. Event monitors are medical devices that record the heart's electrical activity. Doctors most often us  these monitors to diagnose arrhythmias. Arrhythmias are problems with the speed or rhythm of the heartbeat. The monitor is a small, portable device. You can wear one while you do your normal daily activities. This is usually used to diagnose what is causing palpitations/syncope (passing out).    Follow-Up: At [facility], you and your health needs are our priority.  As part of our continuing mission to provide you with exceptional heart care, our providers are all part of one team.  This team includes your primary Cardiologist (physician) and Advanced Practice Providers or APPs (Physician Assistants and Nurse Practitioners) who all work together to provide you with the care you need, when you need it.   Your next appointment:    3 months ( CONTACT  CASSIE HALL/ ANGELINE HAMMER FOR EP SCHEDULING ISSUES )   Provider:   You may see Will Cortland Ding, MD or one of the following Advanced Practice Providers on your designated Care Team:   Mertha Abrahams, [place]  We recommend signing up for the patient portal called MyChart.  Sign up information is provided on this After Visit Summary.   MyChart is used to connect with patients for Virtual Visits (Telemedicine).  Patients are able to view lab/test results, encounter notes, upcoming appointments, etc.  Non-urgent messages can be sent to your provider as well.   To learn more about what you can do with MyChart, go to [email or web address].   Other Instructions      ZIO XT- Long Term Monitor Instructions  Your physician has requested you wear a ZIO patch monitor for 3 days.  This is a single patch monitor. [employer] supplies one patch monitor per enrollment. Additional stickers are not available. Please do not apply patch if you will be having a Nuclear Stress Test,  Echocardiogram, Cardiac CT, MRI, or Chest Xray during the period you would be wearing the  monitor. The patch cannot be worn during these tests. You cannot remove and re-apply the  ZIO XT patch monitor.  Your ZIO patch monitor will be mailed 3 day USPS to your address on file. It may take 3-5 days  to receive your monitor after you have been enrolled.  Once you have received your monitor, please review the enclosed instructions. Your monitor  has already been registered assigning a specific monitor serial # to you.  Billing and Patient Assistance Program Information  We have supplied [employer] with any of your insurance information on file for billing purposes. [employer] offers a sliding scale Patient Assistance Program for patients that do not have  insurance, or whose insurance does not completely cover the cost of the ZIO monitor.  You must apply for the  Patient Assistance Program to qualify for this discounted rate.  To apply, please call [employer] at [phone], select option 4, select option 2, ask to apply for  Patient Assistance Program. Sanna Crystal will ask your household income, and how many people  are in your household. They will quote your out-of-pocket cost based on that information.  [employer] will also be able to set up a 42-month, interest-free payment  plan if needed.  Applying the monitor   Shave hair from upper left chest.  Hold abrader disc by orange tab. Rub abrader in 40 strokes over the upper left chest as  indicated in your monitor instructions.  Clean area with 4 enclosed alcohol pads. Let dry.  Apply patch as indicated in monitor instructions. Patch will be placed under collarbone on left  side of chest with arrow pointing upward.  Rub patch adhesive wings for 2 minutes. Remove white label marked 1. Remove the white  label marked 2. Rub patch adhesive wings for 2 additional minutes.  While looking in a mirror, press and release button in center of patch. A small green light will  flash 3-4 times. This will be your only indicator that the monitor has been turned on.  Do not shower for the first 24 hours. You may shower after the first 24 hours.  Press the button if you feel a symptom. You will hear a small click. Record Date, Time and  Symptom in the Patient Logbook.  When you are ready to remove the patch, follow instructions on the last 2 pages of Patient  Logbook. Stick patch monitor onto the last page of Patient Logbook.  Place Patient Logbook in the blue and white box. Use locking tab on box and tape box closed  securely. The blue and white box has prepaid postage on it. Please place it in the mailbox as  soon as possible. Your physician should have your test results approximately 7 days after the  monitor has been mailed back to [facility].  Call [facility] Customer Care at [phone] if you have questions regarding  your ZIO XT patch monitor. Call them immediately if you see an orange light blinking on your  monitor.  If your monitor falls off in less than 4 days, contact our Monitor department at [phone].  If your monitor becomes loose or falls off after 4 days call [employer] at [phone] for  suggestions on securing your monitor

## 2024-05-02 NOTE — Progress Notes (Unsigned)
Enrolled patient for a 3 day Zio XT monitor to be mailed to patients home   [place] to read

## 2024-05-16 DIAGNOSIS — I493 Ventricular premature depolarization: Secondary | ICD-10-CM | POA: Diagnosis not present

## 2024-05-20 ENCOUNTER — Ambulatory Visit: Payer: Self-pay | Admitting: Physician Assistant

## 2024-05-21 DIAGNOSIS — J301 Allergic rhinitis due to pollen: Secondary | ICD-10-CM | POA: Insufficient documentation

## 2024-05-22 ENCOUNTER — Ambulatory Visit (INDEPENDENT_AMBULATORY_CARE_PROVIDER_SITE_OTHER): Admitting: Family Medicine

## 2024-05-22 VITALS — BP 130/88 | HR 68 | Wt 229.0 lb

## 2024-05-22 DIAGNOSIS — E1142 Type 2 diabetes mellitus with diabetic polyneuropathy: Secondary | ICD-10-CM | POA: Diagnosis not present

## 2024-05-22 DIAGNOSIS — E1169 Type 2 diabetes mellitus with other specified complication: Secondary | ICD-10-CM | POA: Diagnosis not present

## 2024-05-22 DIAGNOSIS — Z952 Presence of prosthetic heart valve: Secondary | ICD-10-CM

## 2024-05-22 DIAGNOSIS — C61 Malignant neoplasm of prostate: Secondary | ICD-10-CM

## 2024-05-22 DIAGNOSIS — I480 Paroxysmal atrial fibrillation: Secondary | ICD-10-CM | POA: Diagnosis not present

## 2024-05-22 DIAGNOSIS — Z860101 Personal history of adenomatous and serrated colon polyps: Secondary | ICD-10-CM

## 2024-05-22 DIAGNOSIS — Z96651 Presence of right artificial knee joint: Secondary | ICD-10-CM

## 2024-05-22 DIAGNOSIS — M1990 Unspecified osteoarthritis, unspecified site: Secondary | ICD-10-CM | POA: Diagnosis not present

## 2024-05-22 DIAGNOSIS — G5601 Carpal tunnel syndrome, right upper limb: Secondary | ICD-10-CM

## 2024-05-22 DIAGNOSIS — I350 Nonrheumatic aortic (valve) stenosis: Secondary | ICD-10-CM

## 2024-05-22 DIAGNOSIS — Z23 Encounter for immunization: Secondary | ICD-10-CM | POA: Diagnosis not present

## 2024-05-22 DIAGNOSIS — E785 Hyperlipidemia, unspecified: Secondary | ICD-10-CM | POA: Diagnosis not present

## 2024-05-22 DIAGNOSIS — J4521 Mild intermittent asthma with (acute) exacerbation: Secondary | ICD-10-CM | POA: Diagnosis not present

## 2024-05-22 DIAGNOSIS — G4733 Obstructive sleep apnea (adult) (pediatric): Secondary | ICD-10-CM | POA: Diagnosis not present

## 2024-05-22 DIAGNOSIS — E118 Type 2 diabetes mellitus with unspecified complications: Secondary | ICD-10-CM

## 2024-05-22 DIAGNOSIS — J301 Allergic rhinitis due to pollen: Secondary | ICD-10-CM

## 2024-05-22 DIAGNOSIS — K219 Gastro-esophageal reflux disease without esophagitis: Secondary | ICD-10-CM | POA: Diagnosis not present

## 2024-05-22 LAB — POCT GLYCOSYLATED HEMOGLOBIN (HGB A1C): Hemoglobin A1C: 6.7 % — AB (ref 4.0–5.6)

## 2024-05-22 MED ORDER — ALBUTEROL SULFATE HFA 108 (90 BASE) MCG/ACT IN AERS: 2.0000 | INHALATION_SPRAY | Freq: Four times a day (QID) | RESPIRATORY_TRACT | 0 refills | Status: DC | PRN

## 2024-05-22 NOTE — Progress Notes (Signed)
 Subjective:    Patient ID: Jeremy Tucker, male    DOB: 12-21-1950, 73 y.o.   MRN: [id number]  STEPHANIE MCGLONE is a 73 y.o. male who presents for follow-up of Type 2 diabetes mellitus.  Home blood sugar records: does not taken sugar records Current symptoms/problems include none and have been stable. Daily foot checks: yes   Any foot concerns: no sometimes a little numbness How often blood sugars checked: does not check sugars  Exercise: walks about 1  1/2 3 to 4 times a week.  Diet: reg diet  He is followed closely by cardiology for most recent issues of PVCs.  He continues on Eliquis .  He has had some cardiac monitoring lately because of that PVCs.  He does have OSA but is not using CPAP.  He does not tolerate it.  He has a previous history of prostate cancer with radiation and seems to be doing well.  Flomax  is helping with his bladder issues.  He continues on sertraline  and Wellbutrin  for psychological benefit which seems to keep him in a good spot.  He does use Viagra  on an as needed basis for ED.  His GERD seems to be under fairly good control with Prilosec.  He continues on Ozempic .   The following portions of the patient's history were reviewed and updated as appropriate: allergies, current medications, past medical history, past social history and problem list.  ROS as in subjective above.     Objective:    Physical Exam Alert and in no distress otherwise not examined.  Hemoglobin A1c is 6.7 Lab Review    Latest Ref Rng & Units 05/22/2024   10:31 AM 03/19/2024   12:15 PM 12/18/2023    2:24 PM 09/05/2023    2:56 PM 09/05/2023    2:17 PM  Diabetic Labs  HbA1c 4.0 - 5.6 % 6.7     6.7   Chol 100 - 199 mg/dL    866    HDL >60 mg/dL    52    Calc LDL 0 - 99 mg/dL    61    Triglycerides 0 - 149 mg/dL    887    Creatinine 9.23 - 1.27 mg/dL  9.01  9.08         2/0/7974    9:27 AM 05/02/2024    3:03 PM 03/29/2024    9:30 AM 03/29/2024    9:23 AM 03/29/2024    9:10 AM  BP/Weight   Systolic BP 130 138 150 138 134  Diastolic BP 88 74 78 89 78  Wt. (Lbs) 229 232     BMI 31.06 kg/m2 31.46 kg/m2         09/05/2023    2:00 PM 08/03/2021   10:30 AM  Foot/eye exam completion dates  Foot Form Completion Done Done    Jeremy Tucker  reports that he has never smoked. He has never used smokeless tobacco. He reports that he does not currently use alcohol. He reports that he does not use drugs.     Assessment & Plan:    Controlled type 2 diabetes mellitus with complication, without long-term current use of insulin  (HCC) - Plan: POCT glycosylated hemoglobin (Hb A1C)  Severe aortic stenosis  Status post total right knee replacement  S/P AVR  Paroxysmal atrial fibrillation (HCC)  OSA (obstructive sleep apnea)  Malignant neoplasm of prostate (HCC)  Gastroesophageal reflux disease without esophagitis  Arthritis  Diabetic peripheral neuropathy (HCC)  Hx of adenomatous colonic polyps  Hyperlipidemia  associated with type 2 diabetes mellitus (HCC)  Need for shingles vaccine - Plan: Varicella-zoster vaccine IM  Need for vaccination against Streptococcus pneumoniae - Plan: Pneumococcal conjugate vaccine 20-valent (Prevnar 20)  Seasonal allergic rhinitis due to pollen  Mild intermittent asthmatic bronchitis with acute exacerbation - Plan: albuterol  (VENTOLIN  HFA) 108 (90 Base) MCG/ACT inhaler  His shots were updated.  He will continue on his Ozempic .  Continue on his other medications but did recommend he use the Prilosec every other day or potentially every third day to get maximum benefit but also try to reduce potential side effects.  Over 40 minutes spent discussing all of these issues with him.  Recheck here in 6 months.

## 2024-05-23 ENCOUNTER — Other Ambulatory Visit: Payer: Self-pay | Admitting: *Deleted

## 2024-05-23 MED ORDER — METOPROLOL SUCCINATE ER 50 MG PO TB24
50.0000 mg | ORAL_TABLET | Freq: Every day | ORAL | 2 refills | Status: AC
Start: 2024-05-23 — End: ?

## 2024-06-17 ENCOUNTER — Other Ambulatory Visit: Payer: Self-pay

## 2024-06-17 DIAGNOSIS — I480 Paroxysmal atrial fibrillation: Secondary | ICD-10-CM

## 2024-06-17 MED ORDER — APIXABAN 5 MG PO TABS: 5.0000 mg | ORAL_TABLET | Freq: Two times a day (BID) | ORAL | 1 refills | Status: DC

## 2024-06-17 NOTE — Telephone Encounter (Signed)
 Prescription refill request for Eliquis  received. Indication:afib Last office visit:6/25 Scr:0.98  5/25 Age: 73 Weight:103.9  kg  Prescription refilled

## 2024-06-19 ENCOUNTER — Telehealth: Payer: Self-pay | Admitting: Family Medicine

## 2024-06-19 DIAGNOSIS — F32A Depression, unspecified: Secondary | ICD-10-CM

## 2024-06-19 DIAGNOSIS — J4521 Mild intermittent asthma with (acute) exacerbation: Secondary | ICD-10-CM

## 2024-06-19 MED ORDER — SERTRALINE HCL 50 MG PO TABS: 50.0000 mg | ORAL_TABLET | Freq: Every day | ORAL | 1 refills | Status: AC

## 2024-06-19 MED ORDER — ALBUTEROL SULFATE HFA 108 (90 BASE) MCG/ACT IN AERS: 2.0000 | INHALATION_SPRAY | Freq: Four times a day (QID) | RESPIRATORY_TRACT | 0 refills | Status: AC | PRN

## 2024-06-19 NOTE — Telephone Encounter (Signed)
 Fax from [employer]  Sertraline  hcl 50 mg  last filled 04/22/24  # 90 Albuterol  hfa 90 mcg inhaler   last filled  05/22/24

## 2024-07-30 DIAGNOSIS — J309 Allergic rhinitis, unspecified: Secondary | ICD-10-CM | POA: Diagnosis not present

## 2024-07-30 DIAGNOSIS — H6123 Impacted cerumen, bilateral: Secondary | ICD-10-CM | POA: Diagnosis not present

## 2024-08-04 NOTE — Progress Notes (Deleted)
 Cardiology Office Note:  .   Date:  08/04/2024  ID:  Jeremy Tucker, DOB Mar 21, 1951, MRN [id number] PCP: Joyce Norleen BROCKS, MD  [facility] Providers Cardiologist:  Stanly DELENA Leavens, MD Electrophysiologist:  Soyla Gladis Norton, MD {  History of Present Illness: .   Jeremy Tucker is a 73 y.o. 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) discussed trans septal approach given his AVR, planned to proceed with ablation in effort to avoid meds  03/29/24: W/anesthesia PVCs disappeared > despite isuprel  PVCs remained quiet > propofol  stopped > no PVCs Aborted PVC ablation 03/29/24 Started on mexiletine  I saw him June 2025 No real observed or sense of improvement in his PVCs (None on EKG) Discussed ablation > he would consider another trip to the lab with minimal sedation, though wanted to give the mexiletine more time. On OAC for his AFib > LAA clip with no confirmed imaging to confirm complete closue. Dr. Leavens suggested cardiac CT/LAA study could be done   Today's visit is scheduled as a planned f/u visit ROS:   *** symptom, burden, PVCs, AFib *** eliquis , dose, bleeding, labs *** cardiac CT/LAA study ? *** more mex? Ablate?   Arrhythmia/AAD hx PACs (by chart notes, ablated, Dr. Kelsie) AFib  Amiodarone  briefly post-op (2022 s/p AVR) PVC ablation ABORTED with no PVCs at time of procedure, 03/29/24 (Dr. Norton) Started on mexiletine 250mg  BID  Studies Reviewed: SABRA    EKG not done today  01/26/24: TTE 1. Abnormal septal motion . Left ventricular ejection fraction, by  estimation, is 50 to 55%. The left ventricle has low normal function. The  left ventricle has no regional wall motion abnormalities. There is mild  left ventricular hypertrophy. Left   ventricular diastolic parameters are indeterminate.   2. Right ventricular systolic function is normal. The right ventricular  size is normal. There is mildly elevated pulmonary artery systolic  pressure.   3. Left atrial size was severely dilated.   4. The mitral valve is abnormal. Mild to moderate mitral valve  regurgitation. No evidence of mitral stenosis.   5. Post AVR with 25 mm Edwards Inpiris Resilia bioprothetic valve. Some  calcium  noted on leaflets. mean gradient 14 peak 26 mmHg no PVL. The  aortic valve has been repaired/replaced. Aortic valve regurgitation is not  visualized. No aortic stenosis is  present. There is a 25 mm INSPIRIS RESILIA valve present in the aortic  position. Procedure Date: 09/20/2021.   6. The inferior vena cava is normal in size with greater than 50%  respiratory variability, suggesting right atrial pressure of 3 mmHg.     2D Doppler echocardiogram 11/02/2021: IMPRESSIONS   1. Left ventricular ejection fraction, by estimation, is 60 to 65%. The  left ventricle has normal function. The left ventricle has no regional  wall motion abnormalities. There is mild asymmetric left ventricular  hypertrophy of the basal and septal  segments. Left ventricular diastolic parameters are indeterminate.   2. Right ventricular systolic function is normal. The right ventricular  size is normal. There is normal pulmonary artery systolic pressure. The  estimated right ventricular systolic pressure is 34.6 mmHg.   3. The mitral valve is normal in structure. Mild mitral valve  regurgitation. No evidence of mitral stenosis.   4. The  aortic valve has been repaired/replaced. Aortic valve  regurgitation is not visualized. No aortic stenosis is present. There is a  25 mm Inspiris Resilia valve present in the aortic position. Echo findings  are consistent with normal structure and  function of the aortic valve prosthesis. Aortic valve area, by VTI  measures 2.10 cm. Aortic  valve mean gradient measures 7.6 mmHg. Aortic  valve Vmax measures 1.99 m/s.   5. The inferior vena cava is normal in size with greater than 50%  respiratory variability, suggesting right atrial pressure of 3 mmHg.   Risk Assessment/Calculations:    Physical Exam:   VS:  There were no vitals taken for this visit.   Wt Readings from Last 3 Encounters:  05/22/24 229 lb (103.9 kg)  05/02/24 232 lb (105.2 kg)  03/29/24 228 lb (103.4 kg)    GEN: Well nourished, well developed in no acute distress NECK: No JVD; No carotid bruits CARDIAC: *** RRR, no murmurs, rubs, gallops RESPIRATORY: *** CTA b/l without rales, wheezing or rhonchi  ABDOMEN: Soft, non-tender, non-distended EXTREMITIES: *** No edema; No deformity   ASSESSMENT AND PLAN: .    paroxysmal AFib CHA2DS2Vasc is 3, on Eliquis , *** appropriately dosed He has a LAA clip, but dont see any imaging that has confirmed complete occlusion of the appendage (including his intra-op TEE) *** No clear symptoms of AFib *** No AFib on recent monitors ***   PVCs Mexiletine *** He has not appreciated a clear improvement in symptoms on the mexiletine No PVCs on his EKG today or with prolonged auscultation He would be open to the idea of trying an ablation with minimal sedation (if that were to be an option), but more inclined to give mexiletine a longer chance, or alternative medication perhaps  Will get a monitor on mexiletine  If his burden is better/low, would be inclined to continue   VHD S/p AVR (bioprosthetic) 2022 Stable function echo 2025 *** C/w Dr. Ascencion  Secondary hypercoagulable state 2/2 AFib       Dispo: back in a 3 months, sooner if needed, he has a couple trips to [place] coming up  Signed, Charlies Macario Arthur, PA-C

## 2024-08-05 ENCOUNTER — Ambulatory Visit: Admitting: Physician Assistant

## 2024-08-08 ENCOUNTER — Ambulatory Visit: Attending: Physician Assistant | Admitting: Physician Assistant

## 2024-08-08 ENCOUNTER — Encounter: Payer: Self-pay | Admitting: Physician Assistant

## 2024-08-08 VITALS — BP 94/62 | HR 72 | Ht 72.0 in | Wt 228.2 lb

## 2024-08-08 DIAGNOSIS — I480 Paroxysmal atrial fibrillation: Secondary | ICD-10-CM | POA: Diagnosis not present

## 2024-08-08 DIAGNOSIS — Q208 Other congenital malformations of cardiac chambers and connections: Secondary | ICD-10-CM

## 2024-08-08 DIAGNOSIS — I493 Ventricular premature depolarization: Secondary | ICD-10-CM | POA: Diagnosis not present

## 2024-08-08 DIAGNOSIS — Z952 Presence of prosthetic heart valve: Secondary | ICD-10-CM

## 2024-08-08 DIAGNOSIS — D6869 Other thrombophilia: Secondary | ICD-10-CM | POA: Diagnosis not present

## 2024-08-08 DIAGNOSIS — R079 Chest pain, unspecified: Secondary | ICD-10-CM

## 2024-08-08 DIAGNOSIS — R42 Dizziness and giddiness: Secondary | ICD-10-CM

## 2024-08-08 NOTE — Progress Notes (Signed)
 Cardiology Office Note:  .   Date:  08/08/2024  ID:  Jeremy Tucker, DOB [birth year], MRN [id number] PCP: Joyce Norleen BROCKS, MD  [facility] Providers Cardiologist:  Stanly DELENA Leavens, MD Electrophysiologist:  Soyla Gladis Norton, MD {  History of Present Illness: .   Jeremy Tucker is a 74 y.o. 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) discussed trans septal approach given his AVR, planned to proceed with ablation in effort to avoid meds  03/29/24: W/anesthesia PVCs disappeared > despite isuprel  PVCs remained quiet > propofol  stopped > no PVCs Aborted PVC ablation 03/29/24 Started on mexiletine  I saw him June 2025 No real observed or sense of improvement in his PVCs (None on EKG) Discussed ablation > he would consider another trip to the lab with minimal sedation, though wanted to give the mexiletine more time. Planned for monitor on mexiletine On OAC for his AFib > LAA clip with no confirmed imaging to confirm complete closue. Dr. Leavens suggested cardiac CT/LAA study could be done  PVC burden is down some from 26% > 17.1%  Metoprolol  was increased   Today's visit is scheduled as a planned f/u visit ROS:    He is accompanied by his wife.  He wants to talk about a few symptoms They both agree started about 1 year ago, about the time he started Ozempic . Not escalating, but continue. Not new or worse with the mexiletine or increased toprol  dose  Has weak spells, these most often seem associate with his walks.  Goes 1.5 miles usually, as he is about back home fairly suddenly very weak, or suddenly off balance perhaps, on 2 occasions, brought him to his knees, no syncope, no associated symptoms Seems very dizzy Not every time he walks  He has  occasional more brief and random dizzy spells, he has not identified any particular pattern, these seem more random With bringing him supine he asks not t go to far back/fast because that makes him dizzy As we chat further quick head movements often trigger his dizziness  When he drinks something fast >> vomits  L sided CP. Starts up toward clavicle and radiates straight down, is a pressure, generally 3-5 min in duration, notes when he has been seated with his L arm in a certain position.  Not provoked by exertion, no associated symptoms  PVCs seem to be less  Arrhythmia/AAD hx PACs (by chart notes, ablated, Dr. Kelsie) AFib  Amiodarone  briefly post-op (2022 s/p AVR) PVC ablation ABORTED with no PVCs at time of procedure, 03/29/24 (Dr. Norton) Started on mexiletine 250mg  BID  Studies Reviewed: SABRA    EKG done today and reviewed by myself SR 73bpm, RBBB, LAD, one PVC  July 2025, monitor HR 50 - 125, average 71 bpm. 1 nonsustained SVT (longest 12 beats) No atrial fibrillation detected. Occasional supraventricular ectopy, 3.9%. Frequent ventricular ectopy, 17.1%. Symptom trigger episodes correspond to sinus rhythm with PVCs  01/26/24: TTE 1. Abnormal septal motion . Left ventricular ejection fraction, by  estimation, is 50 to 55%. The left ventricle has low normal function. The  left ventricle has no regional wall motion abnormalities. There is mild  left ventricular hypertrophy. Left  ventricular diastolic parameters are indeterminate.   2. Right ventricular systolic function is normal. The right  ventricular  size is normal. There is mildly elevated pulmonary artery systolic  pressure.   3. Left atrial size was severely dilated.   4. The mitral valve is abnormal. Mild to moderate mitral valve  regurgitation. No evidence of mitral stenosis.   5. Post AVR with 25 mm Edwards Inpiris Resilia bioprothetic valve. Some  calcium  noted on leaflets. mean gradient 14 peak 26 mmHg no PVL.  The  aortic valve has been repaired/replaced. Aortic valve regurgitation is not  visualized. No aortic stenosis is  present. There is a 25 mm INSPIRIS RESILIA valve present in the aortic  position. Procedure Date: 09/20/2021.   6. The inferior vena cava is normal in size with greater than 50%  respiratory variability, suggesting right atrial pressure of 3 mmHg.     2D Doppler echocardiogram 11/02/2021: IMPRESSIONS   1. Left ventricular ejection fraction, by estimation, is 60 to 65%. The  left ventricle has normal function. The left ventricle has no regional  wall motion abnormalities. There is mild asymmetric left ventricular  hypertrophy of the basal and septal  segments. Left ventricular diastolic parameters are indeterminate.   2. Right ventricular systolic function is normal. The right ventricular  size is normal. There is normal pulmonary artery systolic pressure. The  estimated right ventricular systolic pressure is 34.6 mmHg.   3. The mitral valve is normal in structure. Mild mitral valve  regurgitation. No evidence of mitral stenosis.   4. The aortic valve has been repaired/replaced. Aortic valve  regurgitation is not visualized. No aortic stenosis is present. There is a  25 mm Inspiris Resilia valve present in the aortic position. Echo findings  are consistent with normal structure and  function of the aortic valve prosthesis. Aortic valve area, by VTI  measures 2.10 cm. Aortic valve mean gradient measures 7.6 mmHg. Aortic  valve Vmax measures 1.99 m/s.   5. The inferior vena cava is normal in size with greater than 50%  respiratory variability, suggesting right atrial pressure of 3 mmHg.   Risk Assessment/Calculations:    Physical Exam:   VS:  There were no vitals taken for this visit.   Wt Readings from Last 3 Encounters:  05/22/24 229 lb (103.9 kg)  05/02/24 232 lb (105.2 kg)  03/29/24 228 lb (103.4 kg)    GEN: Well nourished, well developed in no acute  distress NECK: No JVD; No carotid bruits CARDIAC:  RRR, no extrasystoles today with prolonged auscultation, no murmurs, rubs, gallops RESPIRATORY: CTA b/l without rales, wheezing or rhonchi  ABDOMEN: Soft, non-tender, non-distended EXTREMITIES:  No edema; No deformity   ASSESSMENT AND PLAN: .    paroxysmal AFib CHA2DS2Vasc is 3, on Eliquis , appropriately dosed He has a LAA clip, but dont see any imaging that has confirmed complete occlusion of the appendage (including his intra-op TEE) No clear symptoms of AFib No AFib on recent monitors  He would very much like to come off OAC if able/if his LAA is completely clipped/no flow Will pursue cardiac CT w/LAA study/protocol  PVCs Mexiletine Burden is less/improved by symptoms   VHD S/p AVR (bioprosthetic) 2022 Stable function echo 2025 C/w Dr. Ascencion  Secondary hypercoagulable state 2/2 AFib    5. Dizzy, weak spells  Seemed to start up about the time of starting Ozempic  ~ a year Orthostatic vitals are negative though did report dizziness with supine to seated and seated to supine, briefly, none w/3 min stand. Also reported that he could only lay back so far because that also  makes him dizzy Initial rooming BP 94/62 Subsequently 113/73 122/79 111/75 115/75 Head movement also a predictable trigger for his dizziness He is clear, he is not fainting, feel like he is fainting He just saw his ENT to f/u on hx of polyps, but did not discuss his dizziness. He will reach out to his PMD 1st/perhaps ENT  Dispo: due to see Dr. Santo in November, back with EP in 4 mo, sooner if needed.  Signed, Charlies Macario Arthur, PA-C

## 2024-08-08 NOTE — Patient Instructions (Addendum)
 Medication Instructions:   Your physician recommends that you continue on your current medications as directed. Please refer to the Current Medication list given to you today.   *If you need a refill on your cardiac medications before your next appointment, please call your pharmacy*   Lab Work : NONE ORDERED  TODAY    If you have labs (blood work) drawn today and your tests are completely normal, you will receive your results only by: MyChart Message (if you have MyChart) OR A paper copy in the mail If you have any lab test that is abnormal or we need to change your treatment, we will call you to review the results.    Testing/Procedures: Non-Cardiac CT scanning, (CAT scanning), is a noninvasive, special x-ray that produces cross-sectional images of the body using x-rays and a computer. CT scans help physicians diagnose and treat medical conditions. For some CT exams, a contrast material is used to enhance visibility in the area of the body being studied. CT scans provide greater clarity and reveal more details than regular x-ray exams.      Follow-Up: At [facility], you and your health needs are our priority.  As part of our continuing mission to provide you with exceptional heart care, our providers are all part of one team.  This team includes your primary Cardiologist (physician) and Advanced Practice Providers or APPs (Physician Assistants and Nurse Practitioners) who all work together to provide you with the care you need, when you need it.   Your next appointment: ( OVER DUE ) NEXT AVAILABLE IN  NOVEMBER  WITH DR EMILEE   AND  4 month(s)   Provider:  Charlies Arthur, PA-C    We recommend signing up for the patient portal called MyChart.  Sign up information is provided on this After Visit Summary.  MyChart is used to connect with patients for Virtual Visits (Telemedicine).  Patients are able to view lab/test results, encounter notes, upcoming appointments, etc.   Non-urgent messages can be sent to your provider as well.   To learn more about what you can do with MyChart, go to [email or web address].   Other Instructions

## 2024-08-12 ENCOUNTER — Other Ambulatory Visit (HOSPITAL_COMMUNITY): Payer: Self-pay | Admitting: Physician Assistant

## 2024-08-12 ENCOUNTER — Ambulatory Visit (HOSPITAL_COMMUNITY)
Admission: RE | Admit: 2024-08-12 | Discharge: 2024-08-12 | Disposition: A | Discharge status: Home / Self Care | Payer: Self-pay | Source: Ambulatory Visit | Attending: Physician Assistant | Admitting: Physician Assistant

## 2024-08-12 DIAGNOSIS — I2510 Atherosclerotic heart disease of native coronary artery without angina pectoris: Secondary | ICD-10-CM | POA: Insufficient documentation

## 2024-08-12 DIAGNOSIS — Q208 Other congenital malformations of cardiac chambers and connections: Secondary | ICD-10-CM | POA: Insufficient documentation

## 2024-08-14 ENCOUNTER — Ambulatory Visit: Payer: Self-pay | Admitting: Physician Assistant

## 2024-08-20 ENCOUNTER — Ambulatory Visit: Payer: Self-pay

## 2024-08-20 ENCOUNTER — Encounter: Payer: Self-pay | Admitting: Family Medicine

## 2024-08-20 ENCOUNTER — Telehealth: Admitting: Family Medicine

## 2024-08-20 ENCOUNTER — Telehealth: Payer: Self-pay | Admitting: Family Medicine

## 2024-08-20 VITALS — Ht 72.0 in | Wt 225.0 lb

## 2024-08-20 DIAGNOSIS — R42 Dizziness and giddiness: Secondary | ICD-10-CM

## 2024-08-20 DIAGNOSIS — R1110 Vomiting, unspecified: Secondary | ICD-10-CM | POA: Diagnosis not present

## 2024-08-20 NOTE — Telephone Encounter (Signed)
 Calling pt to schedule appt with Dr. Joyce on Tuesday for recheck once he is back in town

## 2024-08-20 NOTE — Telephone Encounter (Signed)
 Patient currently in [place]  and wants to wait until he gets back to see his PCP He states abd pain---middle of abd/under ribs x 1 month off and on.  Also mentioned an episode last week he felt dizzy and his wife took his bp and it was 98/56--he felt better after laying down.  Two nights ago, pt got up in the middle of the night, felt dizzy , was sweating, & vomited while in [place].  Patient states just some mild pain in his abdomen during triage but refused to be evaluated in [place]--as this RN advised. He states if he gets worse he will seek immediate medical attention. Transferred to [facility] as per patient's request.    FYI Only or Action Required?: Action required by provider: update on patient condition.  Patient was last seen in primary care on 05/22/2024 by Joyce Norleen BROCKS, MD.  Called Nurse Triage reporting Abdominal Pain.  Symptoms began about a month ago.  Interventions attempted: Nothing.  Symptoms are: off and on.  Triage Disposition: See HCP Within 4 Hours (Or PCP Triage)  Patient/caregiver understands and will follow disposition?: No, wishes to speak with PCP                 Copied from CRM #[id number]. Topic: Clinical - Red Word Triage >> Aug 20, 2024 10:31 AM Joesph NOVAK wrote: Red Word that prompted transfer to Nurse Triage: stomach aches, when his stomach hurts.. he throws up. Reason for Disposition  [1] MILD-MODERATE pain AND [2] constant AND [3] present > 2 hours    With patient's cardiac history and two episodes of dizziness/sweating and his blood pressure dropping---even though he states he just had mild abd pain at this time--he is advised that it is safest to be seen soon but he wants to wait until he is back in [place] to see his PCP  Answer Assessment - Initial Assessment Questions Got up in the middle of the night, sweating profusely, vomited, dizzy-two nights ago 98/56 was his blood pressure last week and he was really dizzy at that time Patient is currently in [place] Pain under ribs in the middle of abd x 1 month Patient states he had open heart surgery a few years ago Bowel movements have been few and far between Last week patient took two dulcolax and that worked well at that time Patient states that he will be back Friday evening Patient is advised that this RN recommends that he be seen soon for these symptoms Patient states he is not going to be checked out in [place]  and he wants to see his PCP. Patient is advised that the safest recommendation is that he be evaluated soon for these symptoms where he is due to his cardiac history and the recent episodes of low blood pressure with sweating/dizziness, and abdominal pain/nausea/vomiting. Patient states that he does not feel bad now and wants to speak with his office about setting up an appointment He states he appreciates the concern and if anything changes or gets worse he will seek immediate medical attention. Patient is then transferring to [facility] after this RN advised [facility] of patient's symptoms and the recommendation from this RN.  1. LOCATION: Where does it hurt?      Middle of abd under ribs 2. RADIATION: Does the pain shoot anywhere else? (e.g., chest, back)     Non radiating 3. ONSET: When did the pain begin? (e.g., minutes, hours or days ago)      A  month ago 4. SUDDEN: Gradual or sudden onset?     gradual 5. PATTERN Does the pain come and go, or is it constant?     Comes and goes 6. SEVERITY: How bad is the pain?  (e.g., Scale 1-10; mild, moderate, or severe)     mild 9. CARDIAC SYMPTOMS: Do you have any of the following symptoms: chest pain, difficulty breathing, sweating, nausea?     sweating 10. OTHER SYMPTOMS: Do you have any other symptoms? (e.g., back pain, diarrhea, fever, urination pain, vomiting)       Nausea, vomiting  Protocols used: Abdominal Pain - Upper-A-AH

## 2024-08-20 NOTE — Progress Notes (Signed)
   Subjective:    Patient ID: GEORGI NAVARRETE, male    DOB: August 10, 1951, 73 y.o.   MRN: [id number]  HPI Documentation for virtual audio and video telecommunications through Caregility encounter:  The patient was located at home. 2 patient identifiers used.  The provider was located in the office. The patient did consent to this visit and is aware of possible charges through their insurance for this visit. 520 The other persons participating in this telemedicine service were none. Time spent on call was 5 minutes and in review of previous records >20 minutes total for counseling and coordination of care.  This virtual service is not related to other E/M service within previous 7 days.  He is visiting his son in [place] .  He complains of 2 different issues.  He first described a 35-month history of vomiting that occurred postprandially.  He cannot relate this to any particular foods.  It does not occur with every meal.  He then discussed the dizziness and noted that occasionally that would occur with the vomiting and 1 time the blood pressure was 98/56.  He also states that the dizziness is associated occasionally with double vision.  No headache, blurred vision, weakness. 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

## 2024-08-26 DIAGNOSIS — R399 Unspecified symptoms and signs involving the genitourinary system: Secondary | ICD-10-CM | POA: Diagnosis not present

## 2024-08-26 DIAGNOSIS — N528 Other male erectile dysfunction: Secondary | ICD-10-CM | POA: Diagnosis not present

## 2024-08-26 DIAGNOSIS — N200 Calculus of kidney: Secondary | ICD-10-CM | POA: Diagnosis not present

## 2024-08-27 ENCOUNTER — Telehealth: Payer: Self-pay

## 2024-08-27 ENCOUNTER — Encounter: Payer: Self-pay | Admitting: Family Medicine

## 2024-08-27 ENCOUNTER — Ambulatory Visit (INDEPENDENT_AMBULATORY_CARE_PROVIDER_SITE_OTHER): Admitting: Family Medicine

## 2024-08-27 VITALS — BP 138/78 | HR 68 | Ht 72.0 in | Wt 228.0 lb

## 2024-08-27 DIAGNOSIS — E118 Type 2 diabetes mellitus with unspecified complications: Secondary | ICD-10-CM

## 2024-08-27 DIAGNOSIS — R1013 Epigastric pain: Secondary | ICD-10-CM | POA: Diagnosis not present

## 2024-08-27 DIAGNOSIS — Z23 Encounter for immunization: Secondary | ICD-10-CM

## 2024-08-27 DIAGNOSIS — E782 Mixed hyperlipidemia: Secondary | ICD-10-CM

## 2024-08-27 DIAGNOSIS — R1110 Vomiting, unspecified: Secondary | ICD-10-CM

## 2024-08-27 LAB — LIPID PANEL

## 2024-08-27 MED ORDER — TIRZEPATIDE 7.5 MG/0.5ML ~~LOC~~ SOAJ: 7.5000 mg | SUBCUTANEOUS | 0 refills | Status: DC

## 2024-08-27 NOTE — Progress Notes (Signed)
   Subjective:    Patient ID: Jeremy Tucker, male    DOB: [birth year], 73 y.o.   MRN: [id number]  Discussed the use of AI scribe software for clinical note transcription with the patient, who gave verbal consent to proceed.  History of Present Illness   Jeremy Tucker is a 73 year old male who presents with mid-epigastric pain and occasional vomiting.  For the past couple of months, he has been experiencing dull pain in the mid-epigastric region, particularly in the morning before eating. This pain is sometimes accompanied by nausea and occasional vomiting, which can occur both in the morning and sometimes in the evening,  He has not associated the pain with any specific food or activity.  His bowel movements have become less frequent, occurring every few days, which is unusual for him. He took Docolax two weeks ago to help regulate his bowel movements, but he still feels irregular. No diarrhea, but his bowel movements are not as regular as he used to be.   He is currently taking omeprazole  20 mg daily and has been consistent with this medication.  He mentions a history of a colonoscopy and endoscopy performed a couple of years ago due to polyps, but he is unsure whether they were gastric or colon polyps.  He has been on Ozempic  for diabetes management, with his last A1c recorded at 6.7 in July. He has not noticed significant weight changes recently, with his weight fluctuating slightly around 220-228 pounds.  No fever, chills, or diarrhea. Occasional vomiting and nausea, and less frequent bowel movements.           Review of Systems     Objective:    Physical Exam Alert and in no distress.  Abdominal exam shows normal bowel sounds with some slight upper abdominal discomfort but no tenderness or palpable masses.           Assessment & Plan:     Mid-epigastric pain with morning vomiting Chronic mid-epigastric pain with morning vomiting, possibly related to medication or GI  pathology. - Increase omeprazole  to 40 mg daily for two weeks. - Order blood work to assess current status. - Consider referral to gastroenterologist if symptoms persist after medication adjustment.  Constipation Infrequent bowel movements without severe constipation. - Advise against frequent use of laxatives and allow natural bowel regulation. - Monitor bowel movement frequency and consistency.  Type 2 diabetes mellitus Well-controlled Type 2 diabetes mellitus with A1c of 6.7.  - Switch from Ozempic  to Mounjaro at 7.5 mg dosing.  Explained that starting in the middle of the dosing regimen and we need to be careful if there are any GI side effects.  Also might help determine whether Ozempic  was causing some the GI problems. - Monitor for changes in symptoms and weight. - Reassess A1c at the beginning of the year.  Obesity Ongoing obesity management with stable weight. Desire to lose additional 15 pounds. - Switch to Mounjaro for potential weight loss benefits. - Monitor weight and adjust treatment as necessary.  General Health Maintenance Routine health maintenance and vaccinations discussed. - Administer flu and COVID vaccinations. - Order routine blood work to update health status.

## 2024-08-27 NOTE — Telephone Encounter (Signed)
 Pharmacy Patient Advocate Encounter   Received notification from Onbase that prior authorization for tirzepatide (MOUNJARO) 7.5 MG/0.5ML Pen  is required/requested.   Insurance verification completed.   The patient is insured through [facility] ADVANTAGE/RX ADVANCE.   Per test claim: PA required; PA submitted to above mentioned insurance via Latent Key/confirmation #/EOC [id number] Status is pending

## 2024-08-28 ENCOUNTER — Other Ambulatory Visit (HOSPITAL_COMMUNITY): Payer: Self-pay

## 2024-08-28 ENCOUNTER — Ambulatory Visit: Payer: Self-pay | Admitting: Family Medicine

## 2024-08-28 LAB — CBC WITH DIFFERENTIAL/PLATELET
Basophils Absolute: 0 x10E3/uL (ref 0.0–0.2)
Basos: 1 %
EOS (ABSOLUTE): 0.2 x10E3/uL (ref 0.0–0.4)
Eos: 3 %
Hematocrit: 44.6 % (ref 37.5–51.0)
Hemoglobin: 14.7 g/dL (ref 13.0–17.7)
Immature Grans (Abs): 0.1 x10E3/uL (ref 0.0–0.1)
Immature Granulocytes: 1 %
Lymphocytes Absolute: 1.1 x10E3/uL (ref 0.7–3.1)
Lymphs: 14 %
MCH: 31.7 pg (ref 26.6–33.0)
MCHC: 33 g/dL (ref 31.5–35.7)
MCV: 96 fL (ref 79–97)
Monocytes Absolute: 0.7 x10E3/uL (ref 0.1–0.9)
Monocytes: 9 %
Neutrophils Absolute: 5.6 x10E3/uL (ref 1.4–7.0)
Neutrophils: 72 %
Platelets: 169 x10E3/uL (ref 150–450)
RBC: 4.64 x10E6/uL (ref 4.14–5.80)
RDW: 12 % (ref 11.6–15.4)
WBC: 7.7 x10E3/uL (ref 3.4–10.8)

## 2024-08-28 LAB — COMPREHENSIVE METABOLIC PANEL WITH GFR
ALT: 55 IU/L — AB (ref 0–44)
AST: 44 IU/L — AB (ref 0–40)
Albumin: 4.4 g/dL (ref 3.8–4.8)
Alkaline Phosphatase: 71 IU/L (ref 47–123)
BUN/Creatinine Ratio: 17 (ref 10–24)
BUN: 15 mg/dL (ref 8–27)
Bilirubin Total: 0.3 mg/dL (ref 0.0–1.2)
CO2: 20 mmol/L (ref 20–29)
Calcium: 9.2 mg/dL (ref 8.6–10.2)
Chloride: 101 mmol/L (ref 96–106)
Creatinine, Ser: 0.89 mg/dL (ref 0.76–1.27)
Globulin, Total: 2.5 g/dL (ref 1.5–4.5)
Glucose: 133 mg/dL — AB (ref 70–99)
Potassium: 4.5 mmol/L (ref 3.5–5.2)
Sodium: 137 mmol/L (ref 134–144)
Total Protein: 6.9 g/dL (ref 6.0–8.5)
eGFR: 90 mL/min/1.73 (ref >59)

## 2024-08-28 LAB — LIPID PANEL
Cholesterol, Total: 150 mg/dL (ref 100–199)
HDL: 49 mg/dL (ref >39)
LDL CALC COMMENT:: 3.1 ratio (ref 0.0–5.0)
LDL Chol Calc (NIH): 69 mg/dL (ref 0–99)
Triglycerides: 190 mg/dL — AB (ref 0–149)
VLDL Cholesterol Cal: 32 mg/dL (ref 5–40)

## 2024-08-28 NOTE — Telephone Encounter (Signed)
 Pharmacy Patient Advocate Encounter  Received notification from [facility] ADVANTAGE/RX ADVANCE that Prior Authorization for tirzepatide (MOUNJARO) 7.5 MG/0.5ML Pen  has been APPROVED from 08/27/24 to 08/27/25. Unable to obtain price due to refill too soon rejection, last fill date 08/28/24 next available fill date11/05/25   PA #/Case ID/Reference #: [id number]

## 2024-09-18 ENCOUNTER — Telehealth: Payer: Self-pay

## 2024-09-18 DIAGNOSIS — E118 Type 2 diabetes mellitus with unspecified complications: Secondary | ICD-10-CM

## 2024-09-18 MED ORDER — TIRZEPATIDE 10 MG/0.5ML ~~LOC~~ SOAJ: 10.0000 mg | SUBCUTANEOUS | 1 refills | Status: DC

## 2024-09-18 NOTE — Telephone Encounter (Signed)
 Copied from CRM [id number]. Topic: Clinical - Medication Question >> Sep 18, 2024 10:08 AM Jeremy Tucker wrote: Reason for CRM: Pt says he was moved from ozempic  to [facility] and he feels it is not working, still feels like he is needing to eat too much. He wants to know if he can move up to a higher dosage. He is wanting a call back at [phone] and prefers that call to come from Dr. Joyce himself but can speak to a nurse if necessary. Requesting this be sent to: [facility] [id number] - [place], [place] - [address] [place] [address] Phone: [phone] Fax: [phone] Hours: Not open 24 hours

## 2024-09-18 NOTE — Addendum Note (Signed)
 Addended by: JOYCE NORLEEN BROCKS on: 09/18/2024 11:53 AM   Modules accepted: Orders

## 2024-09-18 NOTE — Telephone Encounter (Signed)
 He states his present 7.5 mg of Mounjaro was not helped with his appetite and he has actually gained some weight.  I will increase him to 10 mg.  He is to then call me with a message on MyChart in about a month.

## 2024-09-20 ENCOUNTER — Other Ambulatory Visit: Payer: Self-pay | Admitting: Family Medicine

## 2024-09-20 DIAGNOSIS — E1169 Type 2 diabetes mellitus with other specified complication: Secondary | ICD-10-CM

## 2024-09-23 ENCOUNTER — Other Ambulatory Visit: Payer: Self-pay | Admitting: Family Medicine

## 2024-09-26 ENCOUNTER — Encounter (HOSPITAL_COMMUNITY): Payer: Self-pay

## 2024-10-15 ENCOUNTER — Telehealth: Payer: Self-pay

## 2024-10-15 NOTE — Telephone Encounter (Signed)
 Copied from CRM #[id number]. Topic: Clinical - Medication Question >> Oct 15, 2024 10:47 AM Jasmin G wrote: Reason for CRM: Pt requested a call back at [phone] to discuss questions regarding tirzepatide  (MOUNJARO ) 10 MG/0.5ML Pen.

## 2024-10-17 ENCOUNTER — Telehealth: Payer: Self-pay

## 2024-10-17 DIAGNOSIS — E118 Type 2 diabetes mellitus with unspecified complications: Secondary | ICD-10-CM

## 2024-10-17 DIAGNOSIS — E1169 Type 2 diabetes mellitus with other specified complication: Secondary | ICD-10-CM

## 2024-10-17 DIAGNOSIS — E119 Type 2 diabetes mellitus without complications: Secondary | ICD-10-CM

## 2024-10-17 MED ORDER — TIRZEPATIDE 12.5 MG/0.5ML ~~LOC~~ SOAJ: 12.5000 mg | SUBCUTANEOUS | 1 refills | Status: AC

## 2024-10-17 NOTE — Telephone Encounter (Signed)
 Called Pt to inform him of what to do, He is okay with what you suggested. Thank you

## 2024-10-17 NOTE — Telephone Encounter (Signed)
 Patient called, He is currently taking Mounjaro  10 mg moved from 7 mg and has been on 10 mg for 1 month now. He is very hungry and has gained 2 pounds. He would like to be moved up to a higher dose. He is supposed to take his next dose tomorrow and does not want to take 10 mg Mounjaro  anymore. Please advise. Thank you.

## 2024-10-30 ENCOUNTER — Telehealth: Payer: Self-pay | Admitting: Cardiology

## 2024-10-30 NOTE — Telephone Encounter (Signed)
 Patient calling to speak with the nurse about the CT. Please advise

## 2024-10-30 NOTE — Telephone Encounter (Signed)
 Spoke with patient for CT questions. Pt stated he had one a few months ago and explained the difference between the two. Pt stated he would see us  on the 23rd. Verbalized understanding and plan of care.

## 2024-10-31 NOTE — Progress Notes (Signed)
 Jeremy Tucker                                          MRN: [id number]   10/31/2024   The VBCI Quality Team Specialist reviewed this patient medical record for the purposes of chart review for care gap closure. The following were reviewed: chart review for care gap closure-kidney health evaluation for diabetes:eGFR  and uACR.    VBCI Quality Team

## 2024-11-01 ENCOUNTER — Encounter (HOSPITAL_COMMUNITY): Payer: Self-pay

## 2024-11-05 ENCOUNTER — Ambulatory Visit (HOSPITAL_COMMUNITY)
Admission: RE | Admit: 2024-11-05 | Discharge: 2024-11-05 | Disposition: A | Discharge status: Home / Self Care | Source: Ambulatory Visit | Attending: Internal Medicine | Admitting: Internal Medicine

## 2024-11-05 DIAGNOSIS — Z952 Presence of prosthetic heart valve: Secondary | ICD-10-CM | POA: Insufficient documentation

## 2024-11-05 DIAGNOSIS — Z48812 Encounter for surgical aftercare following surgery on the circulatory system: Secondary | ICD-10-CM | POA: Diagnosis not present

## 2024-11-05 DIAGNOSIS — I517 Cardiomegaly: Secondary | ICD-10-CM | POA: Diagnosis not present

## 2024-11-05 DIAGNOSIS — Q208 Other congenital malformations of cardiac chambers and connections: Secondary | ICD-10-CM | POA: Diagnosis not present

## 2024-11-05 LAB — POCT I-STAT CREATININE: Creatinine, Ser: 1.2 mg/dL (ref 0.61–1.24)

## 2024-11-05 MED ORDER — IOHEXOL 350 MG/ML SOLN
100.0000 mL | Freq: Once | INTRAVENOUS | Status: AC | PRN
  Administered 2024-11-05: 100 mL via INTRAVENOUS

## 2024-11-06 ENCOUNTER — Ambulatory Visit: Payer: Self-pay | Admitting: Physician Assistant

## 2024-11-08 ENCOUNTER — Other Ambulatory Visit: Payer: Self-pay | Admitting: Family Medicine

## 2024-11-08 DIAGNOSIS — F419 Anxiety disorder, unspecified: Secondary | ICD-10-CM

## 2024-11-11 ENCOUNTER — Other Ambulatory Visit: Payer: Self-pay | Admitting: Cardiology

## 2024-11-12 MED ORDER — ASPIRIN 81 MG PO TBEC: 81.0000 mg | DELAYED_RELEASE_TABLET | Freq: Every day | ORAL | Status: AC

## 2024-11-15 ENCOUNTER — Telehealth: Payer: Self-pay | Admitting: Internal Medicine

## 2024-11-15 MED ORDER — OMEPRAZOLE 20 MG PO CPDR: 20.0000 mg | DELAYED_RELEASE_CAPSULE | Freq: Every day | ORAL | 0 refills | Status: DC

## 2024-11-15 NOTE — Telephone Encounter (Signed)
 Refill sent to pharmacy.

## 2024-11-15 NOTE — Telephone Encounter (Signed)
 Inbound call from patient stating he needs omeprazole  refilled and sent to pharmacy on file. Patient is scheduled a follow on 11/20/24.  Please advise  Thank you

## 2024-11-19 ENCOUNTER — Other Ambulatory Visit: Payer: Self-pay | Admitting: Internal Medicine

## 2024-11-19 NOTE — Progress Notes (Unsigned)
 "     Ellouise Console, PA-C [address] [place], [place]  [address] Phone: [phone]   Primary Care Physician: Joyce Norleen BROCKS, MD  Primary Gastroenterologist:  Ellouise Console, PA-C / Dr. Gordy Starch   Chief Complaint: Medication refill omeprazole .  Follow-up GERD.   HPI:   Discussed the use of AI scribe software for clinical note transcription with the patient, who gave verbal consent to proceed.  History of GERD, gastroparesis, prior phytobezoar, history of multiple adenomatous colon polyps, CAD, atrial fibrillation on Eliquis , prior aortic stenosis status post valve repair, history of prostate cancer.  He takes omeprazole  20 mg 1 capsule once daily for acid reflux.  Takes MiraLAX  intermittently for constipation.  Currently on Mounjaro  for weight management.  Of note, labs 08/27/2024 showed mild elevated liver transaminases with AST 44, ALT 55.  08/2021 CTA abdomen pelvis with and without contrast: Severe hepatic steatosis.  Several small hypervascular areas are noted in the liver. These are subcentimeter in size and too small to characterize, but statistically likely to represent tiny perfusion anomalies (transient hepatic attenuation differences) or other benign lesions such as flash fill cavernous hemangiomas. These could be definitively characterized with follow-up nonemergent abdominal MRI with and without IV gadolinium if of clinical concern.  History of Present Illness Gastroesophageal reflux disease - Gastroesophageal reflux disease remains well controlled on omeprazole  20mg  daily. - No current symptoms of reflux. - Nausea resolved after he stopped Ozempic .  He has not had any symptoms of gastroparesis. - He is currently on Mounjaro  and is tolerating that well.  Constipation - Chronic constipation has persisted since initiation of Mounjaro , currently at 12.5 mg. - Bowel movements occur every three days and are difficult to pass. - Minimal improvement with Dulcolax and  MiraLAX .  Has not been consistent with treatment. - Previously experienced nausea and vomiting while on Ozempic , which resolved after discontinuation.  Hepatic steatosis - Severe hepatic steatosis identified on abdominal CT in October 2022. - A few small liver lesions consistent with hemangiomas noted on imaging. - Liver function tests were mildly elevated in October 2025.  General health and functional status - Focused on weight loss and walks regularly. - He has follow-up with his PCP in the next few weeks for labs and physical exam.  Colorectal cancer screening - Last colonoscopy performed in August 2024. - Recommendation to repeat colonoscopy in three years.  GI history:  06/2023 last EGD by Dr. Starch: Irregular Z-line.  1 cm hiatal hernia.  Multiple small benign fundic gland gastric polyps.  Normal duodenum.  Esophageal biopsies were negative for Barrett's.  06/2023 last colonoscopy by Dr. Starch: 4 small (2 mm to 5 mm) tubular adenoma polyps removed.  Good prep (with 2-day prep).  3-year repeat colonoscopy (due 06/2026).  07/2018 EGD by Dr. Luis: normal esophagus.  Innumerable small gastric polyps felt to be fundic gland.  2 cm hiatal hernia.  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  07/2018 Colonoscopy by Dr. Luis:  6 polyps removed.  Colonic biopsies performed.  Cecal polyp focal active colitis.  Transverse and ascending polyps were adenomatous.  Nodular mucosa in the sigmoid showed focal active colitis.  1 sigmoid polyp hyperplastic.   03/2016 gastric emptying study:  Delayed stomach emptying consistent with gastroparesis.  Less than 1% emptied at 1 hr ( normal >= 10%) 3.5% emptied at 2 hr ( normal >= 40%) 34.8% emptied at 3 hr ( normal >= 70%) 89.9%  emptied at 4 hr ( normal >= 90%)   Current Outpatient Medications  Medication Sig Dispense Refill   acetaminophen  (TYLENOL ) 500 MG tablet Take 1,000 mg by mouth  every 8 (eight) hours as needed for mild pain.     albuterol  (VENTOLIN  HFA) 108 (90 Base) MCG/ACT inhaler Inhale 2 puffs into the lungs every 6 (six) hours as needed for wheezing or shortness of breath. 54 g 0   amiodarone  (PACERONE ) 200 MG tablet Take by mouth.     ammonium lactate (LAC-HYDRIN) 12 % lotion Apply 1 application  topically daily as needed for dry skin.     aspirin  EC 81 MG tablet Take 1 tablet (81 mg total) by mouth daily. Swallow whole.     buPROPion  (WELLBUTRIN  SR) 150 MG 12 hr tablet TAKE 1 TABLET BY MOUTH DAILY 90 tablet 3   fluticasone  (FLONASE ) 50 MCG/ACT nasal spray Place 2 sprays into both nostrils daily.     HYDROmorphone  (DILAUDID ) 2 MG tablet Take 2 mg by mouth every 4 (four) hours as needed for severe pain (pain score 7-10) (Kidney stones).     loratadine  (CLARITIN ) 10 MG tablet Take 10 mg by mouth in the morning.     metoprolol  succinate (TOPROL  XL) 50 MG 24 hr tablet Take 1 tablet (50 mg total) by mouth daily. 90 tablet 2   mexiletine (MEXITIL) 250 MG capsule TAKE 1 CAPSULE BY MOUTH 2 TIMES A DAY 180 capsule 2   Multiple Vitamins-Minerals (MULTIVITAMIN WITH MINERALS) tablet Take 1 tablet by mouth daily.     olopatadine  (PATANOL) 0.1 % ophthalmic solution PLACE 1 DROP IN EACH EYE DAILY AS NEEDED FOR ALLERGIES/ TEARING 5 mL 2   Omega-3 Fatty Acids  (FISH OIL) 1000 MG CAPS Take 1,000 mg by mouth daily.     Potassium Citrate  15 MEQ (1620 MG) TBCR Take 1 tablet by mouth 2 (two) times daily.     rosuvastatin  (CRESTOR ) 40 MG tablet TAKE 1 TABLET BY MOUTH DAILY 90 tablet 1   sertraline  (ZOLOFT ) 50 MG tablet Take 1 tablet (50 mg total) by mouth daily. 90 tablet 1   sildenafil  (VIAGRA ) 100 MG tablet Take 1 tablet (100 mg total) by mouth as needed for erectile dysfunction. 30 tablet 5   tamsulosin  (FLOMAX ) 0.4 MG CAPS capsule Take 0.4 mg by mouth daily.     tirzepatide  (MOUNJARO ) 10 MG/0.5ML Pen Inject 10 mg into the skin once a week. 2 mL 1   tirzepatide  (MOUNJARO ) 12.5 MG/0.5ML  Pen Inject 12.5 mg into the skin once a week. 6 mL 1   zolpidem  (AMBIEN  CR) 6.25 MG CR tablet Take 1 tablet (6.25 mg total) by mouth at bedtime as needed for sleep. 30 tablet 0   omeprazole  (PRILOSEC) 20 MG capsule Take 1 capsule (20 mg total) by mouth daily. 90 capsule 3   No current facility-administered medications for this visit.    Allergies as of 11/20/2024 - Review Complete 11/20/2024  Allergen Reaction Noted   Oxycodone Itching 01/21/2015    Past Medical History:  Diagnosis Date   Anxiety    Arthritis    Asthma    Benign fundic gland polyps of stomach    BPH (benign prostatic hyperplasia)    BPH without obstruction/lower urinary tract symptoms    Depression    Diabetic peripheral neuropathy (HCC) 01/07/2019   Dyspnea    w/exertion   ED (erectile dysfunction)    Erosive esophagitis    Focal active colitis 2019   GERD (gastroesophageal reflux disease)  Hiatal hernia    History of chronic bronchitis    per pt used inhaler as needed   History of DVT of lower extremity 2001   s/p  left TKA completed blood thinner treatment,  per pt no previous clot and none since   History of gastric polyp    per pt benign   History of kidney stones    Hx of adenomatous colonic polyps    Hyperlipidemia    Mild CAD cardiologist--- dr h. claudene   nuclear study 04-14-2017 normal , low risk, nuclear ef 53%;  cardiac cath 09-19-2018  midRCA 30% stenosis otherwise normal coronaries,  heavy AV calicification with decreased mobility with peak grandiant   Mild dilation of ascending aorta    per echo 01-13-2021  38mm   Nephrolithiasis    OA (osteoarthritis)    OSA (obstructive sleep apnea)    per pt refused cpap, intolerant;  moderate osa per study in epic 03-08-2015   PAF (paroxysmal atrial fibrillation) (HCC)    followed by dr h. claudene and dr allred 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 and ep ablation 04/ 2016   Peroneal neuropathy at knee, right 12/05/2018   Phytobezoar    Prostate cancer [facility])  urologist--- dr dahlstedt/  oncology-- dr patrcia   first dx 03/ 2021,  Glesaon 3+4  PSA 6.95   Pulmonary nodule 2019   incidently finding on CTA 09-25-2018, stable   Right carpal tunnel syndrome 01/07/2019   Severe aortic stenosis    Type 2 diabetes mellitus (HCC)    followed by pcp   (03-02-2021  pt stated does not check blood sugar at home)   Umbilical hernia     Past Surgical History:  Procedure Laterality Date   AORTIC VALVE REPLACEMENT N/A 09/20/2021   Procedure: AORTIC VALVE REPLACEMENT (AVR) USING 25 MM INSPIRIS RESILIA  AORTIC VALVE;  Surgeon: Lucas Dorise POUR, MD;  Location: [facility] OR;  Service: Open Heart Surgery;  Laterality: N/A;   ATRIAL FIBRILLATION ABLATION N/A 03/12/2015   Procedure: ATRIAL FIBRILLATION ABLATION;  Surgeon: Lynwood Rakers, MD;  Location: [facility] CATH LAB;  Service: Cardiovascular;  Laterality: N/A;   CARDIAC CATHETERIZATION  1990's   Dr. Lavon, no blockages per pt   CARDIOVERSION N/A 02/25/2015   Procedure: CARDIOVERSION;  Surgeon: Wilbert JONELLE Bihari, MD;  Location: [facility] ENDOSCOPY;  Service: Cardiovascular;  Laterality: N/A;   CATARACT EXTRACTION W/ INTRAOCULAR LENS  IMPLANT, BILATERAL  2016   CLIPPING OF ATRIAL APPENDAGE Left 09/20/2021   Procedure: CLIPPING OF ATRIAL APPENDAGE USING ATRICURE 35 MM ATRICLIP;  Surgeon: Lucas Dorise POUR, MD;  Location: [facility] OR;  Service: Open Heart Surgery;  Laterality: Left;   COLONOSCOPY  last one 2013  dr luis   CYSTOSCOPY N/A 03/08/2021   Procedure: CYSTOSCOPY BLADDER CALCULI EXTRACTION ;  Surgeon: Matilda Senior, MD;  Location: [facility];  Service: Urology;  Laterality: N/A;   CYSTOSCOPY WITH RETROGRADE PYELOGRAM, URETEROSCOPY AND STENT PLACEMENT Left 04/24/2015   Procedure: URETHRAL MEATAL DILATION, CYSTOSCOPY WITH LEFT RETROGRADE PYELOGRAM, LEFT URETEROSCOPY, STONE BASKET EXTRACTION,  LEFT DOUBLE J STENT PLACEMENT;  Surgeon: Arlena Gal, MD;  Location: [facility] ORS;  Service: Urology;  Laterality: Left;   EP  IMPLANTABLE DEVICE N/A 09/15/2015   Procedure: Loop Recorder Insertion;  Surgeon: Lynwood Rakers, MD;  Location: [facility] INVASIVE CV LAB;  Service: Cardiovascular;  Laterality: N/A;   ESOPHAGOGASTRODUODENOSCOPY  last one 2017   EXTRACORPOREAL SHOCK WAVE LITHOTRIPSY Bilateral 04/02/2020   Procedure: EXTRACORPOREAL SHOCK WAVE LITHOTRIPSY (ESWL);  Surgeon: Sherrilee,  Belvie CROME, MD;  Location: [facility];  Service: Urology;  Laterality: Bilateral;   EXTRACORPOREAL SHOCK WAVE LITHOTRIPSY Left 04/06/2020   Procedure: EXTRACORPOREAL SHOCK WAVE LITHOTRIPSY (ESWL);  Surgeon: Cam Morene ORN, MD;  Location: [facility];  Service: Urology;  Laterality: Left;   EYE SURGERY     HOLMIUM LASER APPLICATION Left 04/24/2015   Procedure: HOLMIUM LASER APPLICATION;  Surgeon: Arlena Gal, MD;  Location: [facility]S;  Service: Urology;  Laterality: Left;   implantable loopr recorder removal  02/22/2021   MDT LINQ removed in office by Dr Kelsie   KNEE ARTHROSCOPY Bilateral multiple times   LEFT HEART CATH AND CORONARY ANGIOGRAPHY N/A 09/19/2018   Procedure: LEFT HEART CATH AND CORONARY ANGIOGRAPHY;  Surgeon: Claudene Victory ORN, MD;  Location: [facility] INVASIVE CV LAB;  Service: Cardiovascular;  Laterality: N/A;   PERCUTANEOUS NEPHROSTOLITHOTOMY  1980s and 1990s   PVC ABLATION N/A 03/29/2024   Procedure: PVC ABLATION;  Surgeon: Inocencio Soyla Lunger, MD;  Location: [facility] INVASIVE CV LAB;  Service: Cardiovascular;  Laterality: N/A;   RADIOACTIVE SEED IMPLANT N/A 03/08/2021   Procedure: RADIOACTIVE SEED IMPLANT/BRACHYTHERAPY IMPLANT;  Surgeon: Matilda Senior, MD;  Location: [facility];  Service: Urology;  Laterality: N/A;  90 MINS   REPLACEMENT TOTAL KNEE BILATERAL     RIGHT/LEFT HEART CATH AND CORONARY ANGIOGRAPHY N/A 08/04/2021   Procedure: RIGHT/LEFT HEART CATH AND CORONARY ANGIOGRAPHY;  Surgeon: Claudene Victory ORN, MD;  Location: [facility] INVASIVE CV LAB;  Service: Cardiovascular;   Laterality: N/A;   SINUS ENDO WITH FUSION Bilateral 12/24/2021   Procedure: BILATERAL ENDOSCOPIC SINUS SURGERY WITH INTRANASAL POLYPECTOMY AND FUSION NAVIGATION;  Surgeon: Mable Lenis, MD;  Location[facility] OR;  Service: ENT;  Laterality: Bilateral;   SKIN BIOPSY Left 05/06/2020   Shave biopsy left posterior hand neurofiberoma.   SPACE OAR INSTILLATION N/A 03/08/2021   Procedure: SPACE OAR INSTILLATION;  Surgeon: Matilda Senior, MD;  Location: [facility];  Service: Urology;  Laterality: N/A;   TEE WITHOUT CARDIOVERSION N/A 03/12/2015   Procedure: TRANSESOPHAGEAL ECHOCARDIOGRAM (TEE);  Surgeon: Vinie JAYSON Maxcy, MD;  Location: [facility] ENDOSCOPY;  Service: Cardiovascular;  Laterality: N/A;   TEE WITHOUT CARDIOVERSION N/A 09/20/2021   Procedure: TRANSESOPHAGEAL ECHOCARDIOGRAM (TEE);  Surgeon: Lucas Dorise POUR, MD;  Location[facility] OR;  Service: Open Heart Surgery;  Laterality: N/A;   TONSILLECTOMY  age 46   TOTAL KNEE ARTHROPLASTY Left 11-22-1999  @[facility]    TOTAL KNEE ARTHROPLASTY Right 10/30/2017   Procedure: RIGHT TOTAL KNEE ARTHROPLASTY;  Surgeon: Ernie Cough, MD;  Location: [facility] ORS;  Service: Orthopedics;  Laterality: Right;  90 mins   UMBILICAL HERNIA REPAIR N/A 07/05/2021   Procedure: REPAIR INCARCERATED UMBILICAL HERNIA WITH MESH;  Surgeon: Sebastian Moles, MD;  Location: [facility] OR;  Service: General;  Laterality: N/A;    Review of Systems:    All systems reviewed and negative except where noted in HPI.    Physical Exam:  BP 128/70   Pulse 76   Ht 6' (1.829 m)   Wt 227 lb (103 kg)   BMI 30.79 kg/m  No LMP for male patient.  General: Well-nourished, well-developed in no acute distress.  Lungs: Clear to auscultation bilaterally. Non-labored. Heart: Regular rate and rhythm, no murmurs rubs or gallops.  Abdomen: Bowel sounds are normal; Abdomen is Soft; No hepatosplenomegaly, masses or hernias;  No Abdominal Tenderness; No guarding or rebound tenderness. Neuro: Alert and  oriented x 3.  Grossly intact.  Psych: Alert and cooperative, normal mood  and affect.   Imaging Studies: CT CARDIAC MORPH/PULM VEIN W/CM&W/O CA SCORE Result Date: 11/05/2024 CLINICAL DATA:  Post AVR and LAA Clip EXAM: Cardiac Gated CTA TECHNIQUE: A non-contrast, gated CT scan was obtained with axial slices of 3 mm through the heart for calcium  scoring. Calcium  scoring was performed using the Agatston method. A 120 kV prospective, gated, contrast cardiac scan was obtained. Gantry rotation speed was 250 msecs and collimation was 0.6 mm. Nitroglycerin  was not given. A delayed scan was obtained to exclude left atrial appendage thrombus. The 3D dataset was reconstructed in 5% intervals of the 25-50% of the R-R cycle. Late systolic phases were analyzed on a dedicated workstation using MPR, MIP, and VRT modes. The patient received 80 cc of contrast. CONTRAST:  Isovue  370 total 80 cc COMPARISON:  08/25/21 FINDINGS: Severe LAE. Moderate RAE. No PFO/ASD. No pericardial effusion. Post AVR with 25 mm stented bioprosthetic Inspiris valve. Study not done retrospectively for motion. There appears to be moderate HALT at the base of the right/non and right/left commissures. Annulus is intact. Normal ascending thoracic aorta 3.4 cm. There is moderate calcification of the intervalvular fibrosa and anterior leaflet annulus. There is normal PV anatomy with two left sided and 2 right sided veins. The left atrial appendage has been clipped using a 35 mm Atricure Atriclip Pro 2 Model number [id number]. There is no leak and the appendage is fully thrombosed/closed IMPRESSION: 1. Post AVR with 25 mm Inspiris bioprosthetic stented valve. Annulus intact. Moderate HALT at base of right/non and right/left commissures. Suggest serial f/u echo's to monitor gradients 2. Intact LAA clip with no leak and fully thrombosed appendage No thrombus in remainder of LA 3.  Severe LAE and Moderate RAE 4.  Normal PV anatomy 5.  No PFO/ASD 6. Moderate  calcification of the intervalvular fibrosa and anterior mitral annulus 7.  Normal ascending thoracic aorta 3.4 cm 8. Coronary calcium  score 819, which is 76 th percentile for age/sex Electronically Signed   By: Maude Emmer M.D.   On: 11/05/2024 13:43    Labs: CBC    Component Value Date/Time   WBC 7.7 08/27/2024 1410   WBC 8.5 12/18/2023 1424   RBC 4.64 08/27/2024 1410   RBC 4.44 12/18/2023 1424   HGB 14.7 08/27/2024 1410   HCT 44.6 08/27/2024 1410   PLT 169 08/27/2024 1410   MCV 96 08/27/2024 1410   MCH 31.7 08/27/2024 1410   MCH 32.2 12/18/2023 1424   MCHC 33.0 08/27/2024 1410   MCHC 34.5 12/18/2023 1424   RDW 12.0 08/27/2024 1410   LYMPHSABS 1.1 08/27/2024 1410   MONOABS 781 08/16/2016 0925   EOSABS 0.2 08/27/2024 1410   BASOSABS 0.0 08/27/2024 1410    CMP     Component Value Date/Time   NA 137 08/27/2024 1410   K 4.5 08/27/2024 1410   CL 101 08/27/2024 1410   CO2 20 08/27/2024 1410   GLUCOSE 133 (H) 08/27/2024 1410   GLUCOSE 231 (H) 12/18/2023 1424   BUN 15 08/27/2024 1410   CREATININE 1.20 11/05/2024 0937   CREATININE 0.75 08/16/2016 0925   CALCIUM  9.2 08/27/2024 1410   PROT 6.9 08/27/2024 1410   ALBUMIN  4.4 08/27/2024 1410   AST 44 (H) 08/27/2024 1410   ALT 55 (H) 08/27/2024 1410   ALKPHOS 71 08/27/2024 1410   BILITOT 0.3 08/27/2024 1410   GFRNONAA >60 12/18/2023 1424   GFRAA 103 12/10/2019 1110       Assessment and Plan:   Gordy GORMAN Nida  is a 74 y.o. y/o male returns for annual follow-up of:  1.  GERD: Controlled on low-dose PPI. - Refilled omeprazole  20 mg once daily, #90, 3 RF. - Continue GERD diet.  2.  Elevated liver transaminases - I recommended ordering Hepatic panal lab today and patient declined. - He has f/u OV with his PCP in the next few weeks and he will have repeat labs at that time. - I instructed patient if he has persistently elevated LFTs, then he should followup with our office for further evaluation.  He expressed  understanding.  3.  Constipation (On Mounjaro ) - Start OTC Miralax  Powder; Mix 1 capful in 6 to 8 ounces of a drink once daily - Increase to two doses daily if needed, noting full effect may take several days. - Discussed prescription alternatives (Linzess, Trulance, Amitiza) if inadequate response. - Instructed to contact office if symptoms persist or worsen. - Recommend high-fiber diet, 30 g of fiber daily - Eat fruits, vegetables, and whole grains - Drink 64 ounces of water  / fluids daily.    4.  Severe hepatic steatosis seen on abdominal CT in 2022. - Continue Mounjaro  and Weight loss with healthy diet, and exercise. - Patient declined repeat abdominal imaging today.  5.  Liver lesions seen on abdominal CT in 2022. - I recommended scheduling Abdominal MRI with and without IV gadolinium contrast, however patient declined.  6.  History of adenomatous colon polyps - 3-year repeat colonoscopy will be due 06/2026.   Ellouise Console, PA-C  Follow up 1 year to refill medication or sooner if he has GI symptoms or persistent elevated LFTs.   "

## 2024-11-20 ENCOUNTER — Encounter: Payer: Self-pay | Admitting: Physician Assistant

## 2024-11-20 ENCOUNTER — Ambulatory Visit: Admitting: Physician Assistant

## 2024-11-20 VITALS — BP 128/70 | HR 76 | Ht 72.0 in | Wt 227.0 lb

## 2024-11-20 DIAGNOSIS — Z860101 Personal history of adenomatous and serrated colon polyps: Secondary | ICD-10-CM | POA: Diagnosis not present

## 2024-11-20 DIAGNOSIS — K219 Gastro-esophageal reflux disease without esophagitis: Secondary | ICD-10-CM

## 2024-11-20 DIAGNOSIS — K769 Liver disease, unspecified: Secondary | ICD-10-CM

## 2024-11-20 DIAGNOSIS — Z860100 Personal history of colon polyps, unspecified: Secondary | ICD-10-CM

## 2024-11-20 DIAGNOSIS — K5900 Constipation, unspecified: Secondary | ICD-10-CM

## 2024-11-20 DIAGNOSIS — R7401 Elevation of levels of liver transaminase levels: Secondary | ICD-10-CM | POA: Diagnosis not present

## 2024-11-20 DIAGNOSIS — K760 Fatty (change of) liver, not elsewhere classified: Secondary | ICD-10-CM

## 2024-11-20 MED ORDER — OMEPRAZOLE 20 MG PO CPDR: 20.0000 mg | DELAYED_RELEASE_CAPSULE | Freq: Every day | ORAL | 3 refills | Status: AC

## 2024-11-20 NOTE — Patient Instructions (Addendum)
 We have sent the following medications to your pharmacy for you to pick up at your convenience: Omeprazole  20 mg once aily  Please follow up sooner if symptoms increase or worsen  Due to recent changes in healthcare laws, you may see the results of your imaging and laboratory studies on MyChart before your provider has had a chance to review them.  We understand that in some cases there may be results that are confusing or concerning to you. Not all laboratory results come back in the same time frame and the provider may be waiting for multiple results in order to interpret others.  Please give us  48 hours in order for your provider to thoroughly review all the results before contacting the office for clarification of your results.   Thank you for trusting me with your gastrointestinal care!   Ellouise Console, PA-C _______________________________________________________  If your blood pressure at your visit was 140/90 or greater, please contact your primary care physician to follow up on this. _______________________________________________________  If you are age 81 or older, your body mass index should be between 23-30. Your Body mass index is 30.79 kg/m. If this is out of the aforementioned range listed, please consider follow up with your Primary Care Provider.  If you are age 34 or younger, your body mass index should be between 19-25. Your Body mass index is 30.79 kg/m. If this is out of the aformentioned range listed, please consider follow up with your Primary Care Provider.   ________________________________________________________  The [facility] providers would like to encourage you to use MYCHART to communicate with providers for non-urgent requests or questions.  Due to long hold times on the telephone, sending your provider a message by [facility] may be a faster and more efficient way to get a response.  Please allow 48 business hours for a response.  Please remember that this is for  non-urgent requests.  _______________________________________________________

## 2024-11-21 ENCOUNTER — Encounter: Admitting: Family Medicine

## 2024-11-25 ENCOUNTER — Encounter: Payer: Self-pay | Admitting: Family Medicine

## 2024-11-26 ENCOUNTER — Ambulatory Visit: Admitting: Family Medicine

## 2024-11-26 VITALS — BP 108/78 | HR 51 | Ht 73.0 in | Wt 231.0 lb

## 2024-11-26 DIAGNOSIS — Z96651 Presence of right artificial knee joint: Secondary | ICD-10-CM | POA: Diagnosis not present

## 2024-11-26 DIAGNOSIS — J4520 Mild intermittent asthma, uncomplicated: Secondary | ICD-10-CM

## 2024-11-26 DIAGNOSIS — G4733 Obstructive sleep apnea (adult) (pediatric): Secondary | ICD-10-CM | POA: Diagnosis not present

## 2024-11-26 DIAGNOSIS — Z87442 Personal history of urinary calculi: Secondary | ICD-10-CM

## 2024-11-26 DIAGNOSIS — I480 Paroxysmal atrial fibrillation: Secondary | ICD-10-CM

## 2024-11-26 DIAGNOSIS — E118 Type 2 diabetes mellitus with unspecified complications: Secondary | ICD-10-CM | POA: Diagnosis not present

## 2024-11-26 DIAGNOSIS — I350 Nonrheumatic aortic (valve) stenosis: Secondary | ICD-10-CM | POA: Diagnosis not present

## 2024-11-26 DIAGNOSIS — E1159 Type 2 diabetes mellitus with other circulatory complications: Secondary | ICD-10-CM | POA: Diagnosis not present

## 2024-11-26 DIAGNOSIS — G4700 Insomnia, unspecified: Secondary | ICD-10-CM

## 2024-11-26 DIAGNOSIS — Z0000 Encounter for general adult medical examination without abnormal findings: Secondary | ICD-10-CM

## 2024-11-26 DIAGNOSIS — E1169 Type 2 diabetes mellitus with other specified complication: Secondary | ICD-10-CM | POA: Diagnosis not present

## 2024-11-26 DIAGNOSIS — E669 Obesity, unspecified: Secondary | ICD-10-CM | POA: Diagnosis not present

## 2024-11-26 DIAGNOSIS — I5189 Other ill-defined heart diseases: Secondary | ICD-10-CM

## 2024-11-26 DIAGNOSIS — C61 Malignant neoplasm of prostate: Secondary | ICD-10-CM | POA: Diagnosis not present

## 2024-11-26 DIAGNOSIS — Z7901 Long term (current) use of anticoagulants: Secondary | ICD-10-CM | POA: Diagnosis not present

## 2024-11-26 DIAGNOSIS — I152 Hypertension secondary to endocrine disorders: Secondary | ICD-10-CM

## 2024-11-26 DIAGNOSIS — M1990 Unspecified osteoarthritis, unspecified site: Secondary | ICD-10-CM

## 2024-11-26 DIAGNOSIS — Z952 Presence of prosthetic heart valve: Secondary | ICD-10-CM | POA: Diagnosis not present

## 2024-11-26 DIAGNOSIS — N5201 Erectile dysfunction due to arterial insufficiency: Secondary | ICD-10-CM

## 2024-11-26 DIAGNOSIS — Z860101 Personal history of adenomatous and serrated colon polyps: Secondary | ICD-10-CM

## 2024-11-26 DIAGNOSIS — E1142 Type 2 diabetes mellitus with diabetic polyneuropathy: Secondary | ICD-10-CM

## 2024-11-26 DIAGNOSIS — J301 Allergic rhinitis due to pollen: Secondary | ICD-10-CM

## 2024-11-26 LAB — POCT GLYCOSYLATED HEMOGLOBIN (HGB A1C): Hemoglobin A1C: 6.3 % — AB (ref 4.0–5.6)

## 2024-11-26 NOTE — Progress Notes (Signed)
 "  Complete physical exam  Patient: Jeremy Tucker   DOB: 1951-01-11   74 y.o. Male  MRN: [id number]  Subjective:    Chief Complaint  Patient presents with   Annual Exam    Fasting    Jeremy Tucker is a 74 y.o. male who presents today for a complete physical exam.  He reports consuming a general diet. Walks 1 mile 2-3 times a week. He generally feels well. He reports sleeping well. Discussed the use of AI scribe software for clinical note transcription with the patient, who gave verbal consent to proceed. He has a history of atrial fibrillation and mentions a previous open-heart surgery where something was clamped off, possibly a Watchman procedure. He is currently taking Eliquis  but is unsure if he should continue it. He last saw his cardiologist a month ago.  He has a history of aortic stenosis and underwent aortic valve repair. He takes rosuvastatin  for cholesterol management. His recent LDL was 69, and he reports eating more over the holidays.  He is managing diabetes with Mounjaro , previously on Ozempic . His A1c has improved from 6.7 in July to 6.3 currently. He notes a slight weight increase from 229 to 231 pounds.  He continues on Crestor . He experiences persistent watery eyes despite using antihistamines such as loratadine  and cetirizine. He experiences occasional shortness of breath, particularly when walking uphill or climbing stairs, but it resolves quickly. He uses an albuterol  inhaler once a day, which he attributes to feeling 'wheezy'. He does have a history of arthritis but presently is having no difficulty with that.  He has had a joint replacement but is having no difficulty with that. He has a history of prostate cancer treated with seed implantation and is currently on tamsulosin  for urinary symptoms. He expresses regret over the treatment choice.  He does have a history of renal stones but again no recent difficulty. He is scheduled for repeat colonoscopy next year. He  rarely has difficulty with sleep. He reports erectile dysfunction, using Viagra  and penile injections, but notes limited effectiveness. He has been with his partner since he was 15 and values his relationship despite sexual difficulties.  He has leep apnea and does not use a CPAP machine. He reports sleeping well.  He rarely consumes alcohol, with a maximum of one to two drinks per year.      Most recent fall risk assessment:    11/26/2024    9:38 AM  Fall Risk   Falls in the past year? 0  Number falls in past yr: 0  Injury with Fall? 0  Risk for fall due to : No Fall Risks  Follow up Falls evaluation completed     Most recent depression screenings:    11/26/2024    9:45 AM 11/26/2024    9:38 AM  PHQ 2/9 Scores  PHQ - 2 Score 0 0  PHQ- 9 Score 0     Vision:Within last year 1/25 Dermatologist appt on 11/28/24 [facility] Cardiologist [facility] 11/25     Immunization History  Administered Date(s) Administered   Fluad Quad(high Dose 65+) 10/02/2020, 08/03/2021, 09/27/2022   Fluad Trivalent(High Dose 65+) 09/05/2023   INFLUENZA, HIGH DOSE SEASONAL PF 08/27/2024   PFIZER(Purple Top)SARS-COV-2 Vaccination 12/19/2019, 01/09/2020, 10/02/2020   PNEUMOCOCCAL CONJUGATE-20 05/22/2024   Pfizer Covid-19 Vaccine Bivalent Booster 13yrs & up 09/07/2021   Pfizer(Comirnaty)Fall Seasonal Vaccine 12 years and older 09/27/2022, 08/27/2024   Pneumococcal Conjugate-13 08/16/2016   Pneumococcal Polysaccharide-23 02/19/2018  Tdap 05/31/2006, 09/07/2018   Zoster Recombinant(Shingrix ) 09/07/2018, 05/22/2024   Zoster, Live 04/23/2012    Health Maintenance  Topic Date Due   Diabetic kidney evaluation - Urine ACR  08/03/2022   Medicare Annual Wellness (AWV)  08/28/2024   FOOT EXAM  09/04/2024   OPHTHALMOLOGY EXAM  09/19/2024   COVID-19 Vaccine (7 - Pfizer risk 2025-26 season) 02/25/2025   HEMOGLOBIN A1C  05/26/2025   Diabetic kidney evaluation - eGFR measurement   08/27/2025   Colonoscopy  06/21/2026   DTaP/Tdap/Td (3 - Td or Tdap) 09/07/2028   Pneumococcal Vaccine: 50+ Years  Completed   Influenza Vaccine  Completed   Hepatitis C Screening  Completed   Zoster Vaccines- Shingrix   Completed   Meningococcal B Vaccine  Aged Out    Patient Care Team: Joyce Norleen BROCKS, MD as PCP - General (Family Medicine) Santo Stanly LABOR, MD as PCP - Cardiology (Cardiology) Inocencio Soyla Lunger, MD as PCP - Electrophysiology (Cardiology) Alix Charleston, MD as Consulting Physician (Neurosurgery) Ernie Cough, MD as Consulting Physician (Orthopedic Surgery) Grayce Buddle, RN as Oncology Nurse Navigator Matilda Senior, MD as Consulting Physician (Urology) [facility]   Show/hide medication list[1]  Review of Systems  All other systems reviewed and are negative.   Family and social history as well as health maintenance and immunizations was reviewed.     Objective:    BP 108/78   Pulse (!) 51   Ht 6' 1 (1.854 m)   Wt 231 lb (104.8 kg)   SpO2 98%   BMI 30.48 kg/m    Physical Exam   Alert and in no distress. Tympanic membranes and canals are normal. Pharyngeal area is normal. Neck is supple without adenopathy or thyromegaly. Cardiac exam shows a regular sinus rhythm without murmurs or gallops. Lungs are clear to auscultation.      Assessment & Plan:    Discussed health benefits of physical activity, and encouraged him to engage in regular exercise appropriate for his age and condition.  Routine general medical examination at a health care facility  Hyperlipidemia associated with type 2 diabetes mellitus (HCC)  Controlled type 2 diabetes mellitus with complication, without long-term current use of insulin  (HCC) - Plan: HgB A1c  Obesity (BMI 30-39.9)    hypertension associated with diabetes (HCC)  History of renal stone  Insomnia, unspecified type  Status post total right knee replacement  Malignant neoplasm  of prostate (HCC)  Hx of adenomatous colonic polyps  S/P AVR   Type 2 diabetes mellitus Well-controlled with A1c of 6.3. - Continue Mounjaro  12.5 mg. - Maintain current diabetes management plan.  Paroxysmal atrial fibrillation Managed with chronic anticoagulation. Discussed Watchman procedure and potential Eliquis  discontinuation. - Confirm with cardiologist regarding the need for continued anticoagulation post-Watchman procedure.  Diastolic dysfunction Requires cardiology follow-up. - Schedule appointment with cardiologist for follow-up on diastolic dysfunction.  Severe aortic stenosis, status post aortic valve replacement Managed with aortic valve replacement. - Continue routine follow-up with cardiology.  Chronic anticoagulation Managed with Eliquis . Discussed potential discontinuation post-Watchman procedure. - Confirm with cardiologist regarding the need for continued anticoagulation post-Watchman procedure.  Obstructive sleep apnea Potential benefit from Mounjaro  for sleep apnea. - Continue Mounjaro  for potential benefits on sleep apnea.  Obesity Managed with Mounjaro . Weight stable at 231 lbs. - Continue Mounjaro  for weight management.  Mild intermittent asthma Managed with albuterol  inhaler. Discussed potential need for longer-acting inhaler. - Monitor asthma symptoms and consider longer-acting inhaler if symptoms increase.  Osteoarthritis  Erectile dysfunction  due to arterial insufficiency Managed with Viagra  and penile injections. - Continue current management with Viagra  and penile injections.  Malignant neoplasm of prostate, status post brachytherapy  Allergic rhinitis due to pollen Watery eyes not relieved by loratadine  or cetirizine. Plan to try fexofenadine and consider antihistamine drops. - Try fexofenadine (Allegra) for allergic rhinitis. - Consider antihistamine drops if symptoms persist.  General health maintenance Discussed upcoming  colonoscopy and general health maintenance. - Schedule colonoscopy for next year.       Return in about 6 months (around 05/26/2025).      Norleen Jobs, MD     [1]  Outpatient Medications Prior to Visit  Medication Sig   acetaminophen  (TYLENOL ) 500 MG tablet Take 1,000 mg by mouth every 8 (eight) hours as needed for mild pain.   albuterol  (VENTOLIN  HFA) 108 (90 Base) MCG/ACT inhaler Inhale 2 puffs into the lungs every 6 (six) hours as needed for wheezing or shortness of breath.   amiodarone  (PACERONE ) 200 MG tablet Take by mouth.   ammonium lactate (LAC-HYDRIN) 12 % lotion Apply 1 application  topically daily as needed for dry skin.   buPROPion  (WELLBUTRIN  SR) 150 MG 12 hr tablet TAKE 1 TABLET BY MOUTH DAILY   fluticasone  (FLONASE ) 50 MCG/ACT nasal spray Place 2 sprays into both nostrils daily.   HYDROmorphone  (DILAUDID ) 2 MG tablet Take 2 mg by mouth every 4 (four) hours as needed for severe pain (pain score 7-10) (Kidney stones).   loratadine  (CLARITIN ) 10 MG tablet Take 10 mg by mouth in the morning.   metoprolol  succinate (TOPROL  XL) 50 MG 24 hr tablet Take 1 tablet (50 mg total) by mouth daily.   mexiletine (MEXITIL) 250 MG capsule TAKE 1 CAPSULE BY MOUTH 2 TIMES A DAY   Multiple Vitamins-Minerals (MULTIVITAMIN WITH MINERALS) tablet Take 1 tablet by mouth daily.   olopatadine  (PATANOL) 0.1 % ophthalmic solution PLACE 1 DROP IN EACH EYE DAILY AS NEEDED FOR ALLERGIES/ TEARING   Omega-3 Fatty Acids  (FISH OIL) 1000 MG CAPS Take 1,000 mg by mouth daily.   omeprazole  (PRILOSEC) 20 MG capsule Take 1 capsule (20 mg total) by mouth daily.   Potassium Citrate  15 MEQ (1620 MG) TBCR Take 1 tablet by mouth 2 (two) times daily.   rosuvastatin  (CRESTOR ) 40 MG tablet TAKE 1 TABLET BY MOUTH DAILY   sertraline  (ZOLOFT ) 50 MG tablet Take 1 tablet (50 mg total) by mouth daily.   sildenafil  (VIAGRA ) 100 MG tablet Take 1 tablet (100 mg total) by mouth as needed for erectile dysfunction.   tamsulosin   (FLOMAX ) 0.4 MG CAPS capsule Take 0.4 mg by mouth daily.   tirzepatide  (MOUNJARO ) 12.5 MG/0.5ML Pen Inject 12.5 mg into the skin once a week.   aspirin  EC 81 MG tablet Take 1 tablet (81 mg total) by mouth daily. Swallow whole. (Patient not taking: Reported on 11/26/2024)   zolpidem  (AMBIEN  CR) 6.25 MG CR tablet Take 1 tablet (6.25 mg total) by mouth at bedtime as needed for sleep. (Patient not taking: Reported on 11/26/2024)   [DISCONTINUED] tirzepatide  (MOUNJARO ) 10 MG/0.5ML Pen Inject 10 mg into the skin once a week. (Patient not taking: Reported on 11/26/2024)   No facility-administered medications prior to visit.   "

## 2024-12-03 ENCOUNTER — Telehealth: Payer: Self-pay | Admitting: Internal Medicine

## 2024-12-03 NOTE — Telephone Encounter (Signed)
 Pt was notified.

## 2024-12-03 NOTE — Telephone Encounter (Signed)
 Copied from CRM [id number]. Topic: Clinical - Medication Question >> Dec 03, 2024  2:42 PM Roselie BROCKS wrote: Reason for CRM: Patient states he spoke to Dr Joyce at last visit about prescribing  some drops to dry up his eyes, called  fexofenadine and patient would like to know if he is able to use that and can he get it prescribed. If so he would like it to go to  [id number] - [place], [place] - [address],  And patient request a call back concerning this.

## 2024-12-10 NOTE — Telephone Encounter (Signed)
 I called pt. Back and he stated his eyes are still tearing up a lot. He is taking Allegra as suggested by you and that has not helped. He also takes Patanol eye drops as well. He said you suggested another medicine levo something couldn't remmeber the full name he got it but did not think he should take it with Allegra so has not been taking it.    Copied from CRM #[id number]. Topic: Clinical - Medication Question >> Dec 10, 2024  2:34 PM Mercedes MATSU wrote: Reason for CRM: Patient called in stating that he would like Dr. Lelonde or his nurse to give him a call back in regards to his eye drops he has a couple of questions. He can be reached at the number on file [phone].

## 2024-12-17 ENCOUNTER — Telehealth: Payer: Self-pay

## 2024-12-17 NOTE — Telephone Encounter (Signed)
 Copied from CRM #[id number]. Topic: Clinical - Medication Question >> Dec 17, 2024  9:28 AM Donna BRAVO wrote: Reason for CRM: Patient states he as been using over the counter medication for watery eyes. These medications have not been working.    Patient is asking for a prescription to be sent to: [id number] - [place], [place] - [address] [place] [address] Phone: [phone] Fax: [phone] Hours: Not open 24 hours  Patient would like to know when it has been sent to the pharmacy  Please call patient with any questions

## 2025-05-26 ENCOUNTER — Ambulatory Visit: Admitting: Family Medicine
